# Patient Record
Sex: Male | Born: 1940 | Race: White | Hispanic: No | Marital: Married | Smoking: Former smoker
Health system: Southern US, Community
[De-identification: ages and names within clinical notes are randomized; demographics above are authoritative.]

## PROBLEM LIST (undated history)

## (undated) DIAGNOSIS — K8020 Calculus of gallbladder without cholecystitis without obstruction: Secondary | ICD-10-CM

## (undated) DIAGNOSIS — I517 Cardiomegaly: Secondary | ICD-10-CM

## (undated) DIAGNOSIS — I5042 Chronic combined systolic (congestive) and diastolic (congestive) heart failure: Secondary | ICD-10-CM

## (undated) DIAGNOSIS — I4510 Unspecified right bundle-branch block: Secondary | ICD-10-CM

## (undated) DIAGNOSIS — I4891 Unspecified atrial fibrillation: Secondary | ICD-10-CM

## (undated) DIAGNOSIS — I4819 Other persistent atrial fibrillation: Secondary | ICD-10-CM

## (undated) DIAGNOSIS — E785 Hyperlipidemia, unspecified: Secondary | ICD-10-CM

## (undated) DIAGNOSIS — K649 Unspecified hemorrhoids: Secondary | ICD-10-CM

## (undated) DIAGNOSIS — I255 Ischemic cardiomyopathy: Secondary | ICD-10-CM

## (undated) DIAGNOSIS — I340 Nonrheumatic mitral (valve) insufficiency: Secondary | ICD-10-CM

## (undated) DIAGNOSIS — R0989 Other specified symptoms and signs involving the circulatory and respiratory systems: Secondary | ICD-10-CM

## (undated) DIAGNOSIS — K602 Anal fissure, unspecified: Secondary | ICD-10-CM

## (undated) DIAGNOSIS — I219 Acute myocardial infarction, unspecified: Secondary | ICD-10-CM

## (undated) DIAGNOSIS — T783XXA Angioneurotic edema, initial encounter: Secondary | ICD-10-CM

## (undated) DIAGNOSIS — I2510 Atherosclerotic heart disease of native coronary artery without angina pectoris: Secondary | ICD-10-CM

## (undated) DIAGNOSIS — R0600 Dyspnea, unspecified: Secondary | ICD-10-CM

## (undated) HISTORY — PX: HEMORRHOID SURGERY: SHX153

## (undated) HISTORY — DX: Atherosclerotic heart disease of native coronary artery without angina pectoris: I25.10

## (undated) HISTORY — DX: Hyperlipidemia, unspecified: E78.5

## (undated) HISTORY — DX: Other persistent atrial fibrillation: I48.19

## (undated) HISTORY — PX: OTHER SURGICAL HISTORY: SHX169

## (undated) HISTORY — PX: FOOT SURGERY: SHX648

## (undated) HISTORY — DX: Nonrheumatic mitral (valve) insufficiency: I34.0

## (undated) HISTORY — DX: Unspecified right bundle-branch block: I45.10

## (undated) HISTORY — DX: Unspecified atrial fibrillation: I48.91

## (undated) HISTORY — DX: Angioneurotic edema, initial encounter: T78.3XXA

## (undated) HISTORY — DX: Unspecified hemorrhoids: K64.9

## (undated) HISTORY — DX: Other specified symptoms and signs involving the circulatory and respiratory systems: R09.89

## (undated) HISTORY — PX: CHOLECYSTECTOMY: SHX55

## (undated) HISTORY — DX: Chronic combined systolic (congestive) and diastolic (congestive) heart failure: I50.42

## (undated) HISTORY — DX: Ischemic cardiomyopathy: I25.5

## (undated) HISTORY — PX: BACK SURGERY: SHX140

## (undated) HISTORY — PX: HEMORRHOID BANDING: SHX5850

## (undated) HISTORY — DX: Calculus of gallbladder without cholecystitis without obstruction: K80.20

## (undated) HISTORY — DX: Anal fissure, unspecified: K60.2

## (undated) HISTORY — DX: Cardiomegaly: I51.7

---
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
## 2014-04-06 HISTORY — PX: COLONOSCOPY: SHX174

## 2015-02-22 DIAGNOSIS — M542 Cervicalgia: Secondary | ICD-10-CM | POA: Diagnosis not present

## 2015-02-22 DIAGNOSIS — M5412 Radiculopathy, cervical region: Secondary | ICD-10-CM | POA: Diagnosis not present

## 2015-02-24 DIAGNOSIS — M5412 Radiculopathy, cervical region: Secondary | ICD-10-CM | POA: Diagnosis not present

## 2015-02-24 DIAGNOSIS — M542 Cervicalgia: Secondary | ICD-10-CM | POA: Diagnosis not present

## 2015-02-28 DIAGNOSIS — M542 Cervicalgia: Secondary | ICD-10-CM | POA: Diagnosis not present

## 2015-02-28 DIAGNOSIS — M5412 Radiculopathy, cervical region: Secondary | ICD-10-CM | POA: Diagnosis not present

## 2015-03-02 DIAGNOSIS — M4802 Spinal stenosis, cervical region: Secondary | ICD-10-CM | POA: Diagnosis not present

## 2015-03-02 DIAGNOSIS — M47812 Spondylosis without myelopathy or radiculopathy, cervical region: Secondary | ICD-10-CM | POA: Diagnosis not present

## 2015-03-02 DIAGNOSIS — M5412 Radiculopathy, cervical region: Secondary | ICD-10-CM | POA: Diagnosis not present

## 2015-03-02 DIAGNOSIS — M542 Cervicalgia: Secondary | ICD-10-CM | POA: Diagnosis not present

## 2015-03-02 DIAGNOSIS — M5030 Other cervical disc degeneration, unspecified cervical region: Secondary | ICD-10-CM | POA: Diagnosis not present

## 2015-03-04 DIAGNOSIS — M5412 Radiculopathy, cervical region: Secondary | ICD-10-CM | POA: Diagnosis not present

## 2015-03-04 DIAGNOSIS — M542 Cervicalgia: Secondary | ICD-10-CM | POA: Diagnosis not present

## 2015-03-08 DIAGNOSIS — M542 Cervicalgia: Secondary | ICD-10-CM | POA: Diagnosis not present

## 2015-03-08 DIAGNOSIS — M5412 Radiculopathy, cervical region: Secondary | ICD-10-CM | POA: Diagnosis not present

## 2015-03-30 DIAGNOSIS — Z87891 Personal history of nicotine dependence: Secondary | ICD-10-CM | POA: Diagnosis not present

## 2015-03-30 DIAGNOSIS — Z9049 Acquired absence of other specified parts of digestive tract: Secondary | ICD-10-CM | POA: Diagnosis not present

## 2015-03-30 DIAGNOSIS — R030 Elevated blood-pressure reading, without diagnosis of hypertension: Secondary | ICD-10-CM | POA: Diagnosis not present

## 2015-03-30 DIAGNOSIS — R05 Cough: Secondary | ICD-10-CM | POA: Diagnosis not present

## 2015-03-30 DIAGNOSIS — J40 Bronchitis, not specified as acute or chronic: Secondary | ICD-10-CM | POA: Diagnosis not present

## 2015-06-02 DIAGNOSIS — D485 Neoplasm of uncertain behavior of skin: Secondary | ICD-10-CM | POA: Diagnosis not present

## 2015-06-02 DIAGNOSIS — Z08 Encounter for follow-up examination after completed treatment for malignant neoplasm: Secondary | ICD-10-CM | POA: Diagnosis not present

## 2015-06-02 DIAGNOSIS — L570 Actinic keratosis: Secondary | ICD-10-CM | POA: Diagnosis not present

## 2015-06-02 DIAGNOSIS — Z85828 Personal history of other malignant neoplasm of skin: Secondary | ICD-10-CM | POA: Diagnosis not present

## 2015-06-02 DIAGNOSIS — L821 Other seborrheic keratosis: Secondary | ICD-10-CM | POA: Diagnosis not present

## 2015-06-02 DIAGNOSIS — C44612 Basal cell carcinoma of skin of right upper limb, including shoulder: Secondary | ICD-10-CM | POA: Diagnosis not present

## 2015-06-17 DIAGNOSIS — M5030 Other cervical disc degeneration, unspecified cervical region: Secondary | ICD-10-CM | POA: Diagnosis not present

## 2015-06-17 DIAGNOSIS — M4802 Spinal stenosis, cervical region: Secondary | ICD-10-CM | POA: Diagnosis not present

## 2015-06-17 DIAGNOSIS — M9961 Osseous and subluxation stenosis of intervertebral foramina of cervical region: Secondary | ICD-10-CM | POA: Diagnosis not present

## 2015-06-17 DIAGNOSIS — M47812 Spondylosis without myelopathy or radiculopathy, cervical region: Secondary | ICD-10-CM | POA: Diagnosis not present

## 2015-06-22 DIAGNOSIS — M5030 Other cervical disc degeneration, unspecified cervical region: Secondary | ICD-10-CM | POA: Diagnosis not present

## 2015-06-22 DIAGNOSIS — M5412 Radiculopathy, cervical region: Secondary | ICD-10-CM | POA: Diagnosis not present

## 2015-06-26 DIAGNOSIS — R05 Cough: Secondary | ICD-10-CM | POA: Diagnosis not present

## 2015-06-26 DIAGNOSIS — R030 Elevated blood-pressure reading, without diagnosis of hypertension: Secondary | ICD-10-CM | POA: Diagnosis not present

## 2015-06-26 DIAGNOSIS — J40 Bronchitis, not specified as acute or chronic: Secondary | ICD-10-CM | POA: Diagnosis not present

## 2015-06-26 DIAGNOSIS — J329 Chronic sinusitis, unspecified: Secondary | ICD-10-CM | POA: Diagnosis not present

## 2015-06-26 DIAGNOSIS — Z87891 Personal history of nicotine dependence: Secondary | ICD-10-CM | POA: Diagnosis not present

## 2015-06-26 DIAGNOSIS — J309 Allergic rhinitis, unspecified: Secondary | ICD-10-CM | POA: Diagnosis not present

## 2015-06-26 DIAGNOSIS — R0789 Other chest pain: Secondary | ICD-10-CM | POA: Diagnosis not present

## 2015-06-28 DIAGNOSIS — Z9049 Acquired absence of other specified parts of digestive tract: Secondary | ICD-10-CM | POA: Diagnosis not present

## 2015-06-28 DIAGNOSIS — Z87891 Personal history of nicotine dependence: Secondary | ICD-10-CM | POA: Diagnosis not present

## 2015-06-28 DIAGNOSIS — J40 Bronchitis, not specified as acute or chronic: Secondary | ICD-10-CM | POA: Diagnosis not present

## 2015-06-28 DIAGNOSIS — J019 Acute sinusitis, unspecified: Secondary | ICD-10-CM | POA: Diagnosis not present

## 2015-07-01 DIAGNOSIS — C44612 Basal cell carcinoma of skin of right upper limb, including shoulder: Secondary | ICD-10-CM | POA: Diagnosis not present

## 2015-07-13 DIAGNOSIS — M5412 Radiculopathy, cervical region: Secondary | ICD-10-CM | POA: Diagnosis not present

## 2015-07-13 DIAGNOSIS — M50323 Other cervical disc degeneration at C6-C7 level: Secondary | ICD-10-CM | POA: Diagnosis not present

## 2015-07-13 DIAGNOSIS — M5030 Other cervical disc degeneration, unspecified cervical region: Secondary | ICD-10-CM | POA: Diagnosis not present

## 2015-07-13 DIAGNOSIS — M4802 Spinal stenosis, cervical region: Secondary | ICD-10-CM | POA: Diagnosis not present

## 2015-08-01 DIAGNOSIS — M50222 Other cervical disc displacement at C5-C6 level: Secondary | ICD-10-CM | POA: Diagnosis not present

## 2015-08-01 DIAGNOSIS — M50221 Other cervical disc displacement at C4-C5 level: Secondary | ICD-10-CM | POA: Diagnosis not present

## 2015-08-01 DIAGNOSIS — M50223 Other cervical disc displacement at C6-C7 level: Secondary | ICD-10-CM | POA: Diagnosis not present

## 2015-08-01 DIAGNOSIS — M2578 Osteophyte, vertebrae: Secondary | ICD-10-CM | POA: Diagnosis not present

## 2015-08-03 DIAGNOSIS — S143XXS Injury of brachial plexus, sequela: Secondary | ICD-10-CM | POA: Diagnosis not present

## 2015-08-03 DIAGNOSIS — M79601 Pain in right arm: Secondary | ICD-10-CM | POA: Diagnosis not present

## 2015-08-03 DIAGNOSIS — S4990XS Unspecified injury of shoulder and upper arm, unspecified arm, sequela: Secondary | ICD-10-CM | POA: Diagnosis not present

## 2015-08-08 DIAGNOSIS — M79601 Pain in right arm: Secondary | ICD-10-CM | POA: Diagnosis not present

## 2015-08-08 DIAGNOSIS — S4990XS Unspecified injury of shoulder and upper arm, unspecified arm, sequela: Secondary | ICD-10-CM | POA: Diagnosis not present

## 2015-08-08 DIAGNOSIS — S143XXS Injury of brachial plexus, sequela: Secondary | ICD-10-CM | POA: Diagnosis not present

## 2015-08-11 DIAGNOSIS — S143XXS Injury of brachial plexus, sequela: Secondary | ICD-10-CM | POA: Diagnosis not present

## 2015-08-11 DIAGNOSIS — M79601 Pain in right arm: Secondary | ICD-10-CM | POA: Diagnosis not present

## 2015-08-11 DIAGNOSIS — S4990XS Unspecified injury of shoulder and upper arm, unspecified arm, sequela: Secondary | ICD-10-CM | POA: Diagnosis not present

## 2015-08-12 DIAGNOSIS — G9529 Other cord compression: Secondary | ICD-10-CM | POA: Diagnosis not present

## 2015-08-12 DIAGNOSIS — M2578 Osteophyte, vertebrae: Secondary | ICD-10-CM | POA: Diagnosis not present

## 2015-08-16 DIAGNOSIS — F5101 Primary insomnia: Secondary | ICD-10-CM | POA: Diagnosis not present

## 2015-08-16 DIAGNOSIS — E539 Vitamin B deficiency, unspecified: Secondary | ICD-10-CM | POA: Diagnosis not present

## 2015-08-16 DIAGNOSIS — Z8349 Family history of other endocrine, nutritional and metabolic diseases: Secondary | ICD-10-CM | POA: Diagnosis not present

## 2015-08-16 DIAGNOSIS — Z0000 Encounter for general adult medical examination without abnormal findings: Secondary | ICD-10-CM | POA: Diagnosis not present

## 2015-08-16 DIAGNOSIS — Z6827 Body mass index (BMI) 27.0-27.9, adult: Secondary | ICD-10-CM | POA: Diagnosis not present

## 2015-08-16 DIAGNOSIS — M4806 Spinal stenosis, lumbar region: Secondary | ICD-10-CM | POA: Diagnosis not present

## 2015-08-16 DIAGNOSIS — R350 Frequency of micturition: Secondary | ICD-10-CM | POA: Diagnosis not present

## 2015-08-16 DIAGNOSIS — E785 Hyperlipidemia, unspecified: Secondary | ICD-10-CM | POA: Diagnosis not present

## 2015-08-16 DIAGNOSIS — R42 Dizziness and giddiness: Secondary | ICD-10-CM | POA: Diagnosis not present

## 2015-08-16 DIAGNOSIS — N400 Enlarged prostate without lower urinary tract symptoms: Secondary | ICD-10-CM | POA: Diagnosis not present

## 2015-08-16 DIAGNOSIS — E559 Vitamin D deficiency, unspecified: Secondary | ICD-10-CM | POA: Diagnosis not present

## 2015-08-16 DIAGNOSIS — Z23 Encounter for immunization: Secondary | ICD-10-CM | POA: Diagnosis not present

## 2015-08-16 DIAGNOSIS — M79641 Pain in right hand: Secondary | ICD-10-CM | POA: Diagnosis not present

## 2015-08-17 DIAGNOSIS — S143XXS Injury of brachial plexus, sequela: Secondary | ICD-10-CM | POA: Diagnosis not present

## 2015-08-17 DIAGNOSIS — M79601 Pain in right arm: Secondary | ICD-10-CM | POA: Diagnosis not present

## 2015-08-17 DIAGNOSIS — S4990XS Unspecified injury of shoulder and upper arm, unspecified arm, sequela: Secondary | ICD-10-CM | POA: Diagnosis not present

## 2015-08-18 DIAGNOSIS — F5101 Primary insomnia: Secondary | ICD-10-CM | POA: Diagnosis not present

## 2015-08-18 DIAGNOSIS — E559 Vitamin D deficiency, unspecified: Secondary | ICD-10-CM | POA: Diagnosis not present

## 2015-08-18 DIAGNOSIS — R42 Dizziness and giddiness: Secondary | ICD-10-CM | POA: Diagnosis not present

## 2015-08-18 DIAGNOSIS — M4806 Spinal stenosis, lumbar region: Secondary | ICD-10-CM | POA: Diagnosis not present

## 2015-08-18 DIAGNOSIS — R350 Frequency of micturition: Secondary | ICD-10-CM | POA: Diagnosis not present

## 2015-08-18 DIAGNOSIS — E785 Hyperlipidemia, unspecified: Secondary | ICD-10-CM | POA: Diagnosis not present

## 2015-08-18 DIAGNOSIS — E539 Vitamin B deficiency, unspecified: Secondary | ICD-10-CM | POA: Diagnosis not present

## 2015-08-18 DIAGNOSIS — M79641 Pain in right hand: Secondary | ICD-10-CM | POA: Diagnosis not present

## 2015-08-19 DIAGNOSIS — S143XXS Injury of brachial plexus, sequela: Secondary | ICD-10-CM | POA: Diagnosis not present

## 2015-08-19 DIAGNOSIS — M79601 Pain in right arm: Secondary | ICD-10-CM | POA: Diagnosis not present

## 2015-08-19 DIAGNOSIS — S4990XS Unspecified injury of shoulder and upper arm, unspecified arm, sequela: Secondary | ICD-10-CM | POA: Diagnosis not present

## 2015-08-25 DIAGNOSIS — R200 Anesthesia of skin: Secondary | ICD-10-CM | POA: Diagnosis not present

## 2015-08-25 DIAGNOSIS — G5601 Carpal tunnel syndrome, right upper limb: Secondary | ICD-10-CM | POA: Diagnosis not present

## 2015-08-25 DIAGNOSIS — M5020 Other cervical disc displacement, unspecified cervical region: Secondary | ICD-10-CM | POA: Diagnosis not present

## 2015-08-25 DIAGNOSIS — M4802 Spinal stenosis, cervical region: Secondary | ICD-10-CM | POA: Diagnosis not present

## 2015-08-26 DIAGNOSIS — M79601 Pain in right arm: Secondary | ICD-10-CM | POA: Diagnosis not present

## 2015-08-26 DIAGNOSIS — S143XXS Injury of brachial plexus, sequela: Secondary | ICD-10-CM | POA: Diagnosis not present

## 2015-08-26 DIAGNOSIS — S4990XS Unspecified injury of shoulder and upper arm, unspecified arm, sequela: Secondary | ICD-10-CM | POA: Diagnosis not present

## 2015-08-29 DIAGNOSIS — M79601 Pain in right arm: Secondary | ICD-10-CM | POA: Diagnosis not present

## 2015-08-29 DIAGNOSIS — S143XXS Injury of brachial plexus, sequela: Secondary | ICD-10-CM | POA: Diagnosis not present

## 2015-08-29 DIAGNOSIS — S4990XS Unspecified injury of shoulder and upper arm, unspecified arm, sequela: Secondary | ICD-10-CM | POA: Diagnosis not present

## 2015-08-30 DIAGNOSIS — M79601 Pain in right arm: Secondary | ICD-10-CM | POA: Diagnosis not present

## 2015-08-30 DIAGNOSIS — S4990XS Unspecified injury of shoulder and upper arm, unspecified arm, sequela: Secondary | ICD-10-CM | POA: Diagnosis not present

## 2015-08-30 DIAGNOSIS — S143XXS Injury of brachial plexus, sequela: Secondary | ICD-10-CM | POA: Diagnosis not present

## 2015-08-31 DIAGNOSIS — G5601 Carpal tunnel syndrome, right upper limb: Secondary | ICD-10-CM | POA: Diagnosis not present

## 2015-09-06 DIAGNOSIS — G5601 Carpal tunnel syndrome, right upper limb: Secondary | ICD-10-CM | POA: Diagnosis not present

## 2015-09-06 DIAGNOSIS — E539 Vitamin B deficiency, unspecified: Secondary | ICD-10-CM | POA: Diagnosis not present

## 2015-09-06 DIAGNOSIS — R7301 Impaired fasting glucose: Secondary | ICD-10-CM | POA: Diagnosis not present

## 2015-09-06 DIAGNOSIS — I2510 Atherosclerotic heart disease of native coronary artery without angina pectoris: Secondary | ICD-10-CM | POA: Diagnosis not present

## 2015-09-06 DIAGNOSIS — E785 Hyperlipidemia, unspecified: Secondary | ICD-10-CM | POA: Diagnosis not present

## 2015-09-06 DIAGNOSIS — Z01810 Encounter for preprocedural cardiovascular examination: Secondary | ICD-10-CM | POA: Diagnosis not present

## 2015-09-06 DIAGNOSIS — I498 Other specified cardiac arrhythmias: Secondary | ICD-10-CM | POA: Diagnosis not present

## 2015-09-14 DIAGNOSIS — G5601 Carpal tunnel syndrome, right upper limb: Secondary | ICD-10-CM | POA: Diagnosis not present

## 2015-09-20 DIAGNOSIS — I219 Acute myocardial infarction, unspecified: Secondary | ICD-10-CM

## 2015-09-20 DIAGNOSIS — S143XXS Injury of brachial plexus, sequela: Secondary | ICD-10-CM | POA: Diagnosis not present

## 2015-09-20 DIAGNOSIS — M79601 Pain in right arm: Secondary | ICD-10-CM | POA: Diagnosis not present

## 2015-09-20 DIAGNOSIS — S4990XS Unspecified injury of shoulder and upper arm, unspecified arm, sequela: Secondary | ICD-10-CM | POA: Diagnosis not present

## 2015-09-20 HISTORY — DX: Acute myocardial infarction, unspecified: I21.9

## 2015-09-28 DIAGNOSIS — R9431 Abnormal electrocardiogram [ECG] [EKG]: Secondary | ICD-10-CM | POA: Diagnosis not present

## 2015-09-30 DIAGNOSIS — S4990XS Unspecified injury of shoulder and upper arm, unspecified arm, sequela: Secondary | ICD-10-CM | POA: Diagnosis not present

## 2015-09-30 DIAGNOSIS — M79601 Pain in right arm: Secondary | ICD-10-CM | POA: Diagnosis not present

## 2015-09-30 DIAGNOSIS — S143XXS Injury of brachial plexus, sequela: Secondary | ICD-10-CM | POA: Diagnosis not present

## 2015-10-05 DIAGNOSIS — H25813 Combined forms of age-related cataract, bilateral: Secondary | ICD-10-CM | POA: Diagnosis not present

## 2015-10-05 DIAGNOSIS — H43811 Vitreous degeneration, right eye: Secondary | ICD-10-CM | POA: Diagnosis not present

## 2015-10-07 DIAGNOSIS — M79601 Pain in right arm: Secondary | ICD-10-CM | POA: Diagnosis not present

## 2015-10-07 DIAGNOSIS — S143XXS Injury of brachial plexus, sequela: Secondary | ICD-10-CM | POA: Diagnosis not present

## 2015-10-07 DIAGNOSIS — S4990XS Unspecified injury of shoulder and upper arm, unspecified arm, sequela: Secondary | ICD-10-CM | POA: Diagnosis not present

## 2015-10-10 DIAGNOSIS — M79601 Pain in right arm: Secondary | ICD-10-CM | POA: Diagnosis not present

## 2015-10-10 DIAGNOSIS — S143XXS Injury of brachial plexus, sequela: Secondary | ICD-10-CM | POA: Diagnosis not present

## 2015-10-10 DIAGNOSIS — S4990XS Unspecified injury of shoulder and upper arm, unspecified arm, sequela: Secondary | ICD-10-CM | POA: Diagnosis not present

## 2015-10-12 DIAGNOSIS — M79601 Pain in right arm: Secondary | ICD-10-CM | POA: Diagnosis not present

## 2015-10-12 DIAGNOSIS — S4990XS Unspecified injury of shoulder and upper arm, unspecified arm, sequela: Secondary | ICD-10-CM | POA: Diagnosis not present

## 2015-10-12 DIAGNOSIS — S143XXS Injury of brachial plexus, sequela: Secondary | ICD-10-CM | POA: Diagnosis not present

## 2015-10-14 DIAGNOSIS — M79601 Pain in right arm: Secondary | ICD-10-CM | POA: Diagnosis not present

## 2015-10-14 DIAGNOSIS — S4990XS Unspecified injury of shoulder and upper arm, unspecified arm, sequela: Secondary | ICD-10-CM | POA: Diagnosis not present

## 2015-10-14 DIAGNOSIS — S143XXS Injury of brachial plexus, sequela: Secondary | ICD-10-CM | POA: Diagnosis not present

## 2015-10-17 DIAGNOSIS — I2510 Atherosclerotic heart disease of native coronary artery without angina pectoris: Secondary | ICD-10-CM | POA: Diagnosis not present

## 2015-10-19 DIAGNOSIS — M79601 Pain in right arm: Secondary | ICD-10-CM | POA: Diagnosis not present

## 2015-10-19 DIAGNOSIS — S143XXS Injury of brachial plexus, sequela: Secondary | ICD-10-CM | POA: Diagnosis not present

## 2015-10-19 DIAGNOSIS — S4990XS Unspecified injury of shoulder and upper arm, unspecified arm, sequela: Secondary | ICD-10-CM | POA: Diagnosis not present

## 2015-11-02 DIAGNOSIS — S4990XS Unspecified injury of shoulder and upper arm, unspecified arm, sequela: Secondary | ICD-10-CM | POA: Diagnosis not present

## 2015-11-02 DIAGNOSIS — S143XXS Injury of brachial plexus, sequela: Secondary | ICD-10-CM | POA: Diagnosis not present

## 2015-11-02 DIAGNOSIS — M79601 Pain in right arm: Secondary | ICD-10-CM | POA: Diagnosis not present

## 2015-11-04 DIAGNOSIS — M79601 Pain in right arm: Secondary | ICD-10-CM | POA: Diagnosis not present

## 2015-11-04 DIAGNOSIS — S4990XS Unspecified injury of shoulder and upper arm, unspecified arm, sequela: Secondary | ICD-10-CM | POA: Diagnosis not present

## 2015-11-04 DIAGNOSIS — S143XXS Injury of brachial plexus, sequela: Secondary | ICD-10-CM | POA: Diagnosis not present

## 2015-11-09 DIAGNOSIS — S4990XS Unspecified injury of shoulder and upper arm, unspecified arm, sequela: Secondary | ICD-10-CM | POA: Diagnosis not present

## 2015-11-09 DIAGNOSIS — M79601 Pain in right arm: Secondary | ICD-10-CM | POA: Diagnosis not present

## 2015-11-09 DIAGNOSIS — S143XXS Injury of brachial plexus, sequela: Secondary | ICD-10-CM | POA: Diagnosis not present

## 2015-11-14 DIAGNOSIS — S143XXS Injury of brachial plexus, sequela: Secondary | ICD-10-CM | POA: Diagnosis not present

## 2015-11-14 DIAGNOSIS — S4990XS Unspecified injury of shoulder and upper arm, unspecified arm, sequela: Secondary | ICD-10-CM | POA: Diagnosis not present

## 2015-11-14 DIAGNOSIS — M79601 Pain in right arm: Secondary | ICD-10-CM | POA: Diagnosis not present

## 2015-11-18 DIAGNOSIS — S143XXS Injury of brachial plexus, sequela: Secondary | ICD-10-CM | POA: Diagnosis not present

## 2015-11-18 DIAGNOSIS — I2510 Atherosclerotic heart disease of native coronary artery without angina pectoris: Secondary | ICD-10-CM | POA: Diagnosis not present

## 2015-11-18 DIAGNOSIS — M79601 Pain in right arm: Secondary | ICD-10-CM | POA: Diagnosis not present

## 2015-11-18 DIAGNOSIS — S4990XS Unspecified injury of shoulder and upper arm, unspecified arm, sequela: Secondary | ICD-10-CM | POA: Diagnosis not present

## 2015-11-22 DIAGNOSIS — Z23 Encounter for immunization: Secondary | ICD-10-CM | POA: Diagnosis not present

## 2015-11-22 DIAGNOSIS — I2510 Atherosclerotic heart disease of native coronary artery without angina pectoris: Secondary | ICD-10-CM | POA: Diagnosis not present

## 2015-11-22 DIAGNOSIS — E785 Hyperlipidemia, unspecified: Secondary | ICD-10-CM | POA: Diagnosis not present

## 2015-11-22 DIAGNOSIS — K219 Gastro-esophageal reflux disease without esophagitis: Secondary | ICD-10-CM | POA: Diagnosis not present

## 2015-11-22 DIAGNOSIS — G5601 Carpal tunnel syndrome, right upper limb: Secondary | ICD-10-CM | POA: Diagnosis not present

## 2015-11-22 DIAGNOSIS — E539 Vitamin B deficiency, unspecified: Secondary | ICD-10-CM | POA: Diagnosis not present

## 2015-11-22 DIAGNOSIS — Z01810 Encounter for preprocedural cardiovascular examination: Secondary | ICD-10-CM | POA: Diagnosis not present

## 2015-11-22 DIAGNOSIS — R7301 Impaired fasting glucose: Secondary | ICD-10-CM | POA: Diagnosis not present

## 2015-11-22 DIAGNOSIS — Z6827 Body mass index (BMI) 27.0-27.9, adult: Secondary | ICD-10-CM | POA: Diagnosis not present

## 2015-11-25 DIAGNOSIS — S143XXS Injury of brachial plexus, sequela: Secondary | ICD-10-CM | POA: Diagnosis not present

## 2015-11-25 DIAGNOSIS — M79601 Pain in right arm: Secondary | ICD-10-CM | POA: Diagnosis not present

## 2015-11-25 DIAGNOSIS — S4990XS Unspecified injury of shoulder and upper arm, unspecified arm, sequela: Secondary | ICD-10-CM | POA: Diagnosis not present

## 2015-12-09 DIAGNOSIS — S143XXS Injury of brachial plexus, sequela: Secondary | ICD-10-CM | POA: Diagnosis not present

## 2015-12-09 DIAGNOSIS — S4990XS Unspecified injury of shoulder and upper arm, unspecified arm, sequela: Secondary | ICD-10-CM | POA: Diagnosis not present

## 2015-12-09 DIAGNOSIS — M79601 Pain in right arm: Secondary | ICD-10-CM | POA: Diagnosis not present

## 2016-08-17 DIAGNOSIS — H25813 Combined forms of age-related cataract, bilateral: Secondary | ICD-10-CM | POA: Diagnosis not present

## 2016-08-17 DIAGNOSIS — H5203 Hypermetropia, bilateral: Secondary | ICD-10-CM | POA: Diagnosis not present

## 2016-08-17 DIAGNOSIS — H52223 Regular astigmatism, bilateral: Secondary | ICD-10-CM | POA: Diagnosis not present

## 2016-08-17 DIAGNOSIS — H53002 Unspecified amblyopia, left eye: Secondary | ICD-10-CM | POA: Diagnosis not present

## 2016-10-13 DIAGNOSIS — I10 Essential (primary) hypertension: Secondary | ICD-10-CM | POA: Diagnosis not present

## 2016-10-13 DIAGNOSIS — J3089 Other allergic rhinitis: Secondary | ICD-10-CM | POA: Diagnosis not present

## 2016-10-13 DIAGNOSIS — R0602 Shortness of breath: Secondary | ICD-10-CM | POA: Diagnosis not present

## 2016-10-15 ENCOUNTER — Emergency Department (HOSPITAL_COMMUNITY): Payer: Medicare Other

## 2016-10-15 ENCOUNTER — Encounter (HOSPITAL_COMMUNITY): Payer: Self-pay | Admitting: Emergency Medicine

## 2016-10-15 ENCOUNTER — Inpatient Hospital Stay (HOSPITAL_COMMUNITY)
Admission: EM | Admit: 2016-10-15 | Discharge: 2016-10-24 | DRG: 234 | Disposition: A | Discharge status: Home / Self Care | Payer: Medicare Other | Attending: Surgery | Admitting: Surgery

## 2016-10-15 ENCOUNTER — Inpatient Hospital Stay (HOSPITAL_COMMUNITY): Payer: Medicare Other

## 2016-10-15 DIAGNOSIS — I5043 Acute on chronic combined systolic (congestive) and diastolic (congestive) heart failure: Secondary | ICD-10-CM | POA: Diagnosis present

## 2016-10-15 DIAGNOSIS — I25110 Atherosclerotic heart disease of native coronary artery with unstable angina pectoris: Secondary | ICD-10-CM | POA: Diagnosis not present

## 2016-10-15 DIAGNOSIS — I110 Hypertensive heart disease with heart failure: Secondary | ICD-10-CM | POA: Diagnosis not present

## 2016-10-15 DIAGNOSIS — I480 Paroxysmal atrial fibrillation: Secondary | ICD-10-CM | POA: Diagnosis not present

## 2016-10-15 DIAGNOSIS — I5041 Acute combined systolic (congestive) and diastolic (congestive) heart failure: Secondary | ICD-10-CM

## 2016-10-15 DIAGNOSIS — R748 Abnormal levels of other serum enzymes: Secondary | ICD-10-CM

## 2016-10-15 DIAGNOSIS — I2510 Atherosclerotic heart disease of native coronary artery without angina pectoris: Secondary | ICD-10-CM | POA: Diagnosis not present

## 2016-10-15 DIAGNOSIS — Z79899 Other long term (current) drug therapy: Secondary | ICD-10-CM | POA: Diagnosis not present

## 2016-10-15 DIAGNOSIS — I509 Heart failure, unspecified: Secondary | ICD-10-CM | POA: Diagnosis not present

## 2016-10-15 DIAGNOSIS — Z9689 Presence of other specified functional implants: Secondary | ICD-10-CM | POA: Diagnosis present

## 2016-10-15 DIAGNOSIS — E7800 Pure hypercholesterolemia, unspecified: Secondary | ICD-10-CM

## 2016-10-15 DIAGNOSIS — I25758 Atherosclerosis of native coronary artery of transplanted heart with other forms of angina pectoris: Secondary | ICD-10-CM

## 2016-10-15 DIAGNOSIS — I25118 Atherosclerotic heart disease of native coronary artery with other forms of angina pectoris: Secondary | ICD-10-CM | POA: Diagnosis not present

## 2016-10-15 DIAGNOSIS — Z951 Presence of aortocoronary bypass graft: Secondary | ICD-10-CM | POA: Diagnosis not present

## 2016-10-15 DIAGNOSIS — I272 Pulmonary hypertension, unspecified: Secondary | ICD-10-CM | POA: Diagnosis not present

## 2016-10-15 DIAGNOSIS — I5021 Acute systolic (congestive) heart failure: Secondary | ICD-10-CM | POA: Diagnosis not present

## 2016-10-15 DIAGNOSIS — D696 Thrombocytopenia, unspecified: Secondary | ICD-10-CM | POA: Diagnosis not present

## 2016-10-15 DIAGNOSIS — I5020 Unspecified systolic (congestive) heart failure: Secondary | ICD-10-CM | POA: Diagnosis not present

## 2016-10-15 DIAGNOSIS — I083 Combined rheumatic disorders of mitral, aortic and tricuspid valves: Secondary | ICD-10-CM | POA: Diagnosis not present

## 2016-10-15 DIAGNOSIS — I255 Ischemic cardiomyopathy: Secondary | ICD-10-CM | POA: Diagnosis not present

## 2016-10-15 DIAGNOSIS — Z4682 Encounter for fitting and adjustment of non-vascular catheter: Secondary | ICD-10-CM | POA: Diagnosis not present

## 2016-10-15 DIAGNOSIS — D62 Acute posthemorrhagic anemia: Secondary | ICD-10-CM | POA: Diagnosis not present

## 2016-10-15 DIAGNOSIS — J939 Pneumothorax, unspecified: Secondary | ICD-10-CM | POA: Diagnosis not present

## 2016-10-15 DIAGNOSIS — Z7982 Long term (current) use of aspirin: Secondary | ICD-10-CM

## 2016-10-15 DIAGNOSIS — I2582 Chronic total occlusion of coronary artery: Secondary | ICD-10-CM | POA: Diagnosis not present

## 2016-10-15 DIAGNOSIS — R778 Other specified abnormalities of plasma proteins: Secondary | ICD-10-CM

## 2016-10-15 DIAGNOSIS — I071 Rheumatic tricuspid insufficiency: Secondary | ICD-10-CM | POA: Diagnosis present

## 2016-10-15 DIAGNOSIS — Z01810 Encounter for preprocedural cardiovascular examination: Secondary | ICD-10-CM | POA: Diagnosis not present

## 2016-10-15 DIAGNOSIS — R7989 Other specified abnormal findings of blood chemistry: Secondary | ICD-10-CM

## 2016-10-15 DIAGNOSIS — I517 Cardiomegaly: Secondary | ICD-10-CM | POA: Diagnosis not present

## 2016-10-15 DIAGNOSIS — R0602 Shortness of breath: Secondary | ICD-10-CM | POA: Diagnosis not present

## 2016-10-15 DIAGNOSIS — I5023 Acute on chronic systolic (congestive) heart failure: Secondary | ICD-10-CM | POA: Diagnosis not present

## 2016-10-15 DIAGNOSIS — I361 Nonrheumatic tricuspid (valve) insufficiency: Secondary | ICD-10-CM | POA: Diagnosis not present

## 2016-10-15 LAB — PROTIME-INR
INR: 1.09
PROTHROMBIN TIME: 14.1 s (ref 11.4–15.2)

## 2016-10-15 LAB — CBC WITH DIFFERENTIAL/PLATELET
BASOS ABS: 0 10*3/uL (ref 0.0–0.1)
Basophils Relative: 0 %
EOS ABS: 0.1 10*3/uL (ref 0.0–0.7)
Eosinophils Relative: 1 %
HCT: 47.3 % (ref 39.0–52.0)
Hemoglobin: 15.7 g/dL (ref 13.0–17.0)
LYMPHS ABS: 1.8 10*3/uL (ref 0.7–4.0)
Lymphocytes Relative: 22 %
MCH: 30.3 pg (ref 26.0–34.0)
MCHC: 33.2 g/dL (ref 30.0–36.0)
MCV: 91.1 fL (ref 78.0–100.0)
MONO ABS: 0.7 10*3/uL (ref 0.1–1.0)
Monocytes Relative: 8 %
NEUTROS PCT: 69 %
Neutro Abs: 5.6 10*3/uL (ref 1.7–7.7)
PLATELETS: 104 10*3/uL — AB (ref 150–400)
RBC: 5.19 MIL/uL (ref 4.22–5.81)
RDW: 14.9 % (ref 11.5–15.5)
WBC: 8.2 10*3/uL (ref 4.0–10.5)

## 2016-10-15 LAB — COMPREHENSIVE METABOLIC PANEL
ALBUMIN: 4.2 g/dL (ref 3.5–5.0)
ALT: 61 U/L (ref 17–63)
AST: 45 U/L — ABNORMAL HIGH (ref 15–41)
Alkaline Phosphatase: 73 U/L (ref 38–126)
Anion gap: 9 (ref 5–15)
BUN: 23 mg/dL — ABNORMAL HIGH (ref 6–20)
CHLORIDE: 105 mmol/L (ref 101–111)
CO2: 25 mmol/L (ref 22–32)
CREATININE: 1.23 mg/dL (ref 0.61–1.24)
Calcium: 9.1 mg/dL (ref 8.9–10.3)
GFR calc non Af Amer: 56 mL/min — ABNORMAL LOW (ref >60)
GLUCOSE: 101 mg/dL — AB (ref 65–99)
Potassium: 4.4 mmol/L (ref 3.5–5.1)
SODIUM: 139 mmol/L (ref 135–145)
Total Bilirubin: 1.8 mg/dL — ABNORMAL HIGH (ref 0.3–1.2)
Total Protein: 7.1 g/dL (ref 6.5–8.1)

## 2016-10-15 LAB — LIPID PANEL
CHOLESTEROL: 165 mg/dL (ref 0–200)
HDL: 44 mg/dL (ref >40)
LDL Cholesterol: 112 mg/dL — ABNORMAL HIGH (ref 0–99)
TRIGLYCERIDES: 45 mg/dL (ref <150)
Total CHOL/HDL Ratio: 3.8 RATIO
VLDL: 9 mg/dL (ref 0–40)

## 2016-10-15 LAB — MAGNESIUM: MAGNESIUM: 2.2 mg/dL (ref 1.7–2.4)

## 2016-10-15 LAB — I-STAT TROPONIN, ED: Troponin i, poc: 0.4 ng/mL (ref 0.00–0.08)

## 2016-10-15 LAB — ECHOCARDIOGRAM COMPLETE
Height: 73 in
WEIGHTICAEL: 3192 [oz_av]

## 2016-10-15 LAB — TROPONIN I
TROPONIN I: 0.42 ng/mL — AB (ref <0.03)
TROPONIN I: 0.31 ng/mL — AB (ref <0.03)

## 2016-10-15 LAB — BRAIN NATRIURETIC PEPTIDE: B NATRIURETIC PEPTIDE 5: 350.5 pg/mL — AB (ref 0.0–100.0)

## 2016-10-15 LAB — HEPARIN LEVEL (UNFRACTIONATED): Heparin Unfractionated: 0.28 IU/mL — ABNORMAL LOW (ref 0.30–0.70)

## 2016-10-15 LAB — D-DIMER, QUANTITATIVE (NOT AT ARMC): D DIMER QUANT: 1.05 ug{FEU}/mL — AB (ref 0.00–0.50)

## 2016-10-15 LAB — I-STAT CG4 LACTIC ACID, ED: Lactic Acid, Venous: 1.3 mmol/L (ref 0.5–1.9)

## 2016-10-15 MED ORDER — ALBUTEROL SULFATE (2.5 MG/3ML) 0.083% IN NEBU
5.0000 mg | INHALATION_SOLUTION | Freq: Once | RESPIRATORY_TRACT | Status: AC
Start: 2016-10-15 — End: 2016-10-15
  Administered 2016-10-15: 5 mg via RESPIRATORY_TRACT
  Filled 2016-10-15: qty 6

## 2016-10-15 MED ORDER — LOSARTAN POTASSIUM 50 MG PO TABS
50.0000 mg | ORAL_TABLET | Freq: Every day | ORAL | Status: DC
  Administered 2016-10-15 – 2016-10-17 (×2): 50 mg via ORAL
  Filled 2016-10-15 (×3): qty 1

## 2016-10-15 MED ORDER — ONDANSETRON HCL 4 MG/2ML IJ SOLN
4.0000 mg | Freq: Four times a day (QID) | INTRAMUSCULAR | Status: DC | PRN
Start: 2016-10-15 — End: 2016-10-18

## 2016-10-15 MED ORDER — IOPAMIDOL (ISOVUE-370) INJECTION 76%
100.0000 mL | Freq: Once | INTRAVENOUS | Status: AC | PRN
  Administered 2016-10-15: 100 mL via INTRAVENOUS

## 2016-10-15 MED ORDER — IPRATROPIUM BROMIDE 0.02 % IN SOLN
0.5000 mg | Freq: Once | RESPIRATORY_TRACT | Status: AC
  Administered 2016-10-15: 0.5 mg via RESPIRATORY_TRACT
  Filled 2016-10-15: qty 2.5

## 2016-10-15 MED ORDER — METHYLPREDNISOLONE SODIUM SUCC 125 MG IJ SOLR
125.0000 mg | Freq: Once | INTRAMUSCULAR | Status: AC
  Administered 2016-10-15: 125 mg via INTRAVENOUS
  Filled 2016-10-15: qty 2

## 2016-10-15 MED ORDER — ONDANSETRON HCL 4 MG PO TABS: 4.0000 mg | ORAL_TABLET | Freq: Four times a day (QID) | ORAL | Status: DC | PRN

## 2016-10-15 MED ORDER — SODIUM CHLORIDE 0.9 % IV SOLN
INTRAVENOUS | Status: DC
  Administered 2016-10-16: 06:00:00 via INTRAVENOUS

## 2016-10-15 MED ORDER — ACETAMINOPHEN 650 MG RE SUPP: 650.0000 mg | Freq: Four times a day (QID) | RECTAL | Status: DC | PRN

## 2016-10-15 MED ORDER — HEPARIN (PORCINE) IN NACL 100-0.45 UNIT/ML-% IJ SOLN
1200.0000 [IU]/h | INTRAMUSCULAR | Status: DC
  Administered 2016-10-15: 1200 [IU]/h via INTRAVENOUS
  Filled 2016-10-15 (×2): qty 250

## 2016-10-15 MED ORDER — SODIUM CHLORIDE 0.9% FLUSH
3.0000 mL | Freq: Two times a day (BID) | INTRAVENOUS | Status: DC
  Administered 2016-10-15: 3 mL via INTRAVENOUS

## 2016-10-15 MED ORDER — FUROSEMIDE 10 MG/ML IJ SOLN
40.0000 mg | Freq: Once | INTRAMUSCULAR | Status: AC
  Administered 2016-10-15: 40 mg via INTRAVENOUS
  Filled 2016-10-15: qty 4

## 2016-10-15 MED ORDER — SODIUM CHLORIDE 0.9% FLUSH: 3.0000 mL | INTRAVENOUS | Status: DC | PRN

## 2016-10-15 MED ORDER — ALBUTEROL SULFATE (2.5 MG/3ML) 0.083% IN NEBU
2.5000 mg | INHALATION_SOLUTION | RESPIRATORY_TRACT | Status: DC | PRN
  Administered 2016-10-17: 2.5 mg via RESPIRATORY_TRACT

## 2016-10-15 MED ORDER — ASPIRIN 81 MG PO CHEW
81.0000 mg | CHEWABLE_TABLET | ORAL | Status: AC
  Administered 2016-10-16: 81 mg via ORAL
  Filled 2016-10-15: qty 1

## 2016-10-15 MED ORDER — HEPARIN BOLUS VIA INFUSION
4000.0000 [IU] | Freq: Once | INTRAVENOUS | Status: AC
  Administered 2016-10-15: 4000 [IU] via INTRAVENOUS
  Filled 2016-10-15: qty 4000

## 2016-10-15 MED ORDER — POLYETHYLENE GLYCOL 3350 17 G PO PACK: 17.0000 g | PACK | Freq: Every day | ORAL | Status: DC | PRN

## 2016-10-15 MED ORDER — SODIUM CHLORIDE 0.9 % IV SOLN: 250.0000 mL | INTRAVENOUS | Status: DC | PRN

## 2016-10-15 MED ORDER — ASPIRIN 325 MG PO TABS
325.0000 mg | ORAL_TABLET | Freq: Once | ORAL | Status: AC
  Administered 2016-10-15: 325 mg via ORAL
  Filled 2016-10-15: qty 1

## 2016-10-15 MED ORDER — HEPARIN (PORCINE) IN NACL 100-0.45 UNIT/ML-% IJ SOLN
1350.0000 [IU]/h | INTRAMUSCULAR | Status: DC
  Administered 2016-10-16: 1350 [IU]/h via INTRAVENOUS
  Filled 2016-10-15: qty 250

## 2016-10-15 MED ORDER — ACETAMINOPHEN 325 MG PO TABS: 650.0000 mg | ORAL_TABLET | Freq: Four times a day (QID) | ORAL | Status: DC | PRN

## 2016-10-15 MED ORDER — CARVEDILOL 3.125 MG PO TABS
3.1250 mg | ORAL_TABLET | Freq: Two times a day (BID) | ORAL | Status: DC
  Administered 2016-10-15 – 2016-10-17 (×5): 3.125 mg via ORAL
  Filled 2016-10-15 (×6): qty 1

## 2016-10-15 MED ORDER — IOPAMIDOL (ISOVUE-370) INJECTION 76%
INTRAVENOUS | Status: AC
  Administered 2016-10-15: 100 mL via INTRAVENOUS
  Filled 2016-10-15: qty 100

## 2016-10-15 MED ORDER — FUROSEMIDE 10 MG/ML IJ SOLN
40.0000 mg | Freq: Two times a day (BID) | INTRAMUSCULAR | Status: DC
  Administered 2016-10-16 – 2016-10-17 (×4): 40 mg via INTRAVENOUS
  Filled 2016-10-15 (×4): qty 4

## 2016-10-15 NOTE — Progress Notes (Signed)
  Echocardiogram 2D Echocardiogram has been performed.  Darlina Sicilian M 10/15/2016, 12:03 PM

## 2016-10-15 NOTE — ED Notes (Signed)
ED Provider at bedside. 

## 2016-10-15 NOTE — ED Triage Notes (Signed)
Pt reports having increasing shortness of breath over the last several days and is worsening when laying flat and increasing shortness of breath with exertion.

## 2016-10-15 NOTE — Progress Notes (Addendum)
EF is low, with WMA will have pt transferred to [facility] and plan Rt and Lt heart cath tomorrow.  Will begin ARB and BB.  Dr. Johnsie Cancel is aware and has talked to wife by phone.

## 2016-10-15 NOTE — Progress Notes (Signed)
ANTICOAGULATION CONSULT NOTE - Initial Consult  Pharmacy Consult for heparin Indication: chest pain/ACS vs new onset CHF vs PE vs asthma or PNA  No Known Allergies  Patient Measurements: Height: 6\' 1"  (185.4 cm) Weight: 199 lb 8 oz (90.5 kg) IBW/kg (Calculated) : 79.9 Heparin Dosing Weight:90.5 kg  Vital Signs: Temp: 98.7 F (37.1 C) (08/27 0740) Temp Source: Oral (08/27 0740) BP: 137/97 (08/27 0740) Pulse Rate: 84 (08/27 0740)  Labs:  Recent Labs  10/15/16 0752  HGB 15.7  HCT 47.3  PLT PENDING  CREATININE 1.23    Estimated Creatinine Clearance: 58.6 mL/min (by C-G formula based on SCr of 1.23 mg/dL).   Medical History: History reviewed. No pertinent past medical history.   Assessment 76 yo M with SOB.  Pharmacy consulted to dose heparin for ACS/STEMI vs new onset CHF vs PE vs asthma or PNA.  No anticoagulants PTA.  WT 90.5 kg, creat 1.23, D dimer 1.05. Troponin 0.4. CT angio ordered to r/o PE.  Goal of Therapy:  Heparin level 0.3-0.7 units/ml Monitor platelets by anticoagulation protocol: Yes   Plan:  Give 4000 units bolus x 1 Start heparin infusion at 1200 units/hr Check anti-Xa level in 8 hours and daily while on heparin Continue to monitor H&H and platelets F/u CT angio to r/o PE F/u troponins  Eudelia Bunch, Pharm.D. [phone] 10/15/2016 8:55 AM

## 2016-10-15 NOTE — ED Notes (Signed)
Echo at bedside

## 2016-10-15 NOTE — ED Notes (Signed)
[facility] called for transport. 

## 2016-10-15 NOTE — ED Notes (Signed)
Cardiology at bedside.

## 2016-10-15 NOTE — Progress Notes (Signed)
[place] for heparin Indication: chest pain/ACS vs new onset CHF vs PE vs asthma or PNA  No Known Allergies  Patient Measurements: Height: 6\' 1"  (185.4 cm) Weight: 199 lb 8 oz (90.5 kg) IBW/kg (Calculated) : 79.9 Heparin Dosing Weight:90.5 kg  Vital Signs: BP: 146/88 (08/27 1900) Pulse Rate: 88 (08/27 1900)  Labs:  Recent Labs  10/15/16 0752 10/15/16 0803 10/15/16 1155 10/15/16 1827  HGB 15.7  --   --   --   HCT 47.3  --   --   --   PLT 104*  --   --   --   LABPROT  --  14.1  --   --   INR  --  1.09  --   --   HEPARINUNFRC  --   --   --  0.28*  CREATININE 1.23  --   --   --   TROPONINI  --   --  0.42*  --     Estimated Creatinine Clearance: 58.6 mL/min (by C-G formula based on SCr of 1.23 mg/dL).   Medical History: History reviewed. No pertinent past medical history.   Assessment 76 yo M with SOB.  Pharmacy consulted to dose heparin for ACS/STEMI vs new onset CHF vs PE vs asthma or PNA.  No anticoagulants PTA.  WT 90.5 kg, creat 1.23, D dimer 1.05. Troponin 0.4. CT angio ordered to r/o PE.    CTAngio = - PE  1st Heparin level = 0.28 with heparin infusing @ 1200 units/hr  No complications of therapy noted  Goal of Therapy:  Heparin level 0.3-0.7 units/ml Monitor platelets by anticoagulation protocol: Yes   Plan:  Increase IV heparin to 1350 units/hr Check heparin level 8 hr after rate increase Follow daily CBC  Leone Haven, PharmD 10/15/2016 8:01 PM

## 2016-10-15 NOTE — ED Provider Notes (Addendum)
[facility] DEPT Provider Note   CSN: [id number] Arrival date & time: 10/15/16  0701     History   Chief Complaint Chief Complaint  Patient presents with  . Shortness of Breath    HPI Jason Day is a 76 y.o. male history of previous cholecystectomy, previous back surgery who presenting with shortness of breath, chest pain. Patient just moved here from [facility] and has no doctors here. 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 He has progressive shortness of breath. Patient states that initially he is worse at night and he sometimes wake up in the middle night short of breath. He went to urgent care 3 days ago and received a shot of steroids as well as an albuterol inhaler but has not helped his symptoms. Since yesterday, he noticed that he has worsening shortness of breath especially with exertion. He denies any fevers or chills or cough. Patient denies any cardiac history or history of blood clots. Denies any leg swelling. He did have previous stent in his liver after he was admitted in [facility] for possible flu but was in the hospital for about 3 months. Patient has no cardiac stents.   The history is provided by the patient.    History reviewed. No pertinent past medical history.  There are no active problems to display for this patient.   Past Surgical History:  Procedure Laterality Date  . BACK SURGERY    . CHOLECYSTECTOMY    . FOOT SURGERY    . stents in liver         Home Medications    Prior to Admission medications   Medication Sig Start Date End Date Taking? Authorizing Provider  albuterol (PROVENTIL HFA;VENTOLIN HFA) 108 (90 Base) MCG/ACT inhaler Inhale 1 puff into the lungs every 6 (six) hours as needed for wheezing or shortness of breath.   Yes [provider]  aspirin EC 81 MG tablet Take 81 mg by mouth daily.   Yes [provider]  diphenhydrAMINE (BENADRYL) 25 MG tablet Take 25 mg by mouth daily as needed for allergies.   Yes [provider]  oxymetazoline (AFRIN) 0.05 % nasal spray Place 1 spray into both nostrils at bedtime.   Yes [provider]  POTASSIUM PO Take 1 tablet by mouth daily.   Yes [provider]    Family History History reviewed. No pertinent family history.  Social History Social History  Substance Use Topics  . Smoking status: Never Smoker  . Smokeless tobacco: Never Used  . Alcohol use No     Allergies   Patient has no known allergies.   Review of Systems Review of Systems  Respiratory: Positive for shortness of breath.   All other systems reviewed and are negative.    Physical Exam Updated Vital Signs BP (!) 137/97 (BP Location: Left Arm)   Pulse 84   Temp 98.7 F (37.1 C) (Oral)   Resp (!) 27   Ht 6\' 1"  (1.854 m)   Wt 90.5 kg (199 lb 8 oz)   SpO2 100%   BMI 26.32 kg/m   Physical Exam  Constitutional:  Slightly tachypneic   HENT:  Head: Normocephalic.  Mouth/Throat: Oropharynx is clear and moist.  Eyes: Pupils are equal, round, and reactive to light. Conjunctivae and EOM are normal.  Neck: Normal range of motion. Neck supple.  Cardiovascular: Normal rate, regular rhythm and normal heart sounds.   Pulmonary/Chest:  Crackles R base, minimal wheezing throughout, slightly tachypneic. No retractions   Abdominal: Soft. Bowel sounds are normal. He exhibits no distension. There is no tenderness. There is no guarding.  Musculoskeletal: Normal range of motion. He exhibits no edema or deformity.  No obvious edema, no calf tenderness   Neurological: He is alert.  Skin: Skin is warm.  Psychiatric: He has a normal mood and affect.  Nursing note and vitals reviewed.    ED Treatments / Results  Labs (all labs ordered are listed, but only abnormal results are displayed) Labs Reviewed  CBC WITH DIFFERENTIAL/PLATELET - Abnormal;  Notable for the following:       Result Value   Platelets 104 (*)    All other components within normal limits  COMPREHENSIVE METABOLIC PANEL - Abnormal; Notable for the following:    Glucose, Bld 101 (*)    BUN 23 (*)    AST 45 (*)    Total Bilirubin 1.8 (*)    GFR calc non Af Amer 56 (*)    All other components within normal limits  BRAIN NATRIURETIC PEPTIDE - Abnormal; Notable for the following:    B Natriuretic Peptide 350.5 (*)    All other components within normal limits  D-DIMER, QUANTITATIVE (NOT AT [facility]) - Abnormal; Notable for the following:    D-Dimer, Quant 1.05 (*)    All other components within normal limits  I-STAT TROPONIN, ED - Abnormal; Notable for the following:    Troponin i, poc 0.40 (*)    All other components within normal limits  PROTIME-INR  I-STAT CG4 LACTIC ACID, ED    EKG  EKG Interpretation  Date/Time:  Monday October 15 2016 08:51:40 EDT Ventricular Rate:  84 PR Interval:    QRS Duration: 129 QT Interval:  404 QTC Calculation: 478 R Axis:   28 Text Interpretation:  Sinus rhythm Ventricular premature complex IVCD, consider atypical RBBB No significant change since last tracing Confirmed by Wandra Arthurs [phone]) on 10/15/2016 9:08:28 AM       Radiology Dg Chest 2 View  Result Date: 10/15/2016 CLINICAL DATA:  Shortness of breath. EXAM: CHEST  2 VIEW COMPARISON:  No prior . FINDINGS: Mediastinum hilar structures normal. Cardiomegaly. Diffuse mild bilateral pulmonary interstitial prominence consistent with mild pulmonary interstitial edema noted . Tiny bilateral pleural effusions cannot be excluded . No pleural effusion or pneumothorax. IMPRESSION: Diffuse mild bilateral pulmonary interstitial prominence consistent mild pulmonary interstitial edema tiny bilateral pleural effusions cannot be excluded. Electronically Signed   By: Marcello Moores  Register   On: 10/15/2016 07:51   Ct Angio Chest Pe W And/or Wo Contrast  Result Date: 10/15/2016 CLINICAL DATA:   Pt reports having increasing shortness of breath over the last several days and is worsening when laying flat and increasing shortness of breath with exertion EXAM: CT ANGIOGRAPHY CHEST WITH CONTRAST TECHNIQUE: Multidetector CT imaging of the chest was performed using the standard protocol during bolus administration of intravenous contrast. Multiplanar CT image reconstructions and MIPs were obtained to evaluate the vascular anatomy. CONTRAST:  100 mL Isovue 370 COMPARISON:  None. FINDINGS: Cardiovascular: Satisfactory opacification of the pulmonary arteries to the segmental level. No evidence of pulmonary embolism. Cardiomegaly. No pericardial effusion. 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 Mediastinum/Nodes: No enlarged mediastinal, hilar, or axillary lymph nodes. Thyroid gland, trachea, and esophagus demonstrate no significant findings. Lungs/Pleura: Small bilateral pleural effusions. Bilateral mild interstitial thickening. Upper  Abdomen: No acute abnormality. Musculoskeletal: No acute osseous abnormality. Large calcified right paracentral disc protrusion at C6-7 extending along the right lateral thecal sac. Review of the MIP images confirms the above findings. IMPRESSION: 1. No evidence of pulmonary embolus. 2. No thoracic aortic aneurysm. 3. Findings concerning for CHF. Electronically Signed   By: Kathreen Devoid   On: 10/15/2016 09:27    Procedures Procedures (including critical care time)  CRITICAL CARE Performed by: Wandra Arthurs   Total critical care time: 30 minutes  Critical care time was exclusive of separately billable procedures and treating other patients.  Critical care was necessary to treat or prevent imminent or life-threatening deterioration.  Critical care was time spent personally by me on the following activities: development of treatment plan with patient and/or surrogate as well as nursing, discussions with consultants, evaluation of patient's response  to treatment, examination of patient, obtaining history from patient or surrogate, ordering and performing treatments and interventions, ordering and review of laboratory studies, ordering and review of radiographic studies, pulse oximetry and re-evaluation of patient's condition.   Medications Ordered in ED Medications  heparin ADULT infusion 100 units/mL (25000 units/235mL sodium chloride 0.45%) (not administered)  heparin bolus via infusion 4,000 Units (not administered)  furosemide (LASIX) injection 40 mg (not administered)  aspirin tablet 325 mg (not administered)  albuterol (PROVENTIL) (2.5 MG/3ML) 0.083% nebulizer solution 5 mg (5 mg Nebulization Given 10/15/16 0805)  ipratropium (ATROVENT) nebulizer solution 0.5 mg (0.5 mg Nebulization Given 10/15/16 0805)  methylPREDNISolone sodium succinate (SOLU-MEDROL) 125 mg/2 mL injection 125 mg (125 mg Intravenous Given 10/15/16 0838)  iopamidol (ISOVUE-370) 76 % injection 100 mL (100 mLs Intravenous Contrast Given 10/15/16 0856)     Initial Impression / Assessment and Plan / ED Course  I have reviewed the triage vital signs and the nursing notes.  Pertinent labs & imaging results that were available during my care of the patient were reviewed by me and considered in my medical decision making (see chart for details).     Jason Day is a 76 y.o. male here with SOB worse at night and worse with exertion. Consider ACS vs new onset CHF vs PE vs asthma or pneumonia. Will get labs, CXR, BNP, d-dimer. Will give nebs, steroids and reassess.   9:44 AM Patient's Trop 0.4. Repeat EKG showed no obvious STEMI. BNP 350. D-dimer positive, but CT angio showed no PE, just CHF. I suspect NSTEMI causing new onset CHF or CHF with demand ischemia. Given ASA, started on heparin drip. Given lasix for diuresis. Called Cardiology to admit for CHF, positive trop.   10:59 AM Dr. Johnsie Cancel saw patient. Recommend hospitalist admission and stress test tomorrow, continue to  trend troponins.   Final Clinical Impressions(s) / ED Diagnoses   Final diagnoses:  None    New Prescriptions New Prescriptions   No medications on file     Drenda Freeze, MD 10/15/16 0973    Drenda Freeze, MD 10/15/16 [phone]

## 2016-10-15 NOTE — Consult Note (Signed)
Cardiology Consultation:   Patient ID: Jason Day; [id number]; 06-Dec-1940   Admit date: 10/15/2016 Date of Consult: 10/15/2016  Primary Care Provider: Patient, No Pcp Per Primary Cardiologist: NEW - Dr. Johnsie Cancel ( has cardiologist in [place].) Primary Electrophysiologist:  NA   Patient Profile:   Jason Day is a 76 y.o. male with a hx of no cardiology problems who is being seen today for the evaluation of acute SOB at the request of Dr. Darl Householder.  History of Present Illness:   Mr. Chasen and his wife just recently moved here from [place].  He has kept up with his health but only has had Lumbar lam.  No HTN, No Heart disease.  He had POET in [place] last year and it was normal.  No meds added.     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  Mostly with exertion.  Was seen at urgent care on [address] on Sat and given steroids and inhaler, may have helped mildly.  Only pain rt mid back lateral. This AM at 0530 he sat up in bed unable to breathe.    No chest pain.     In ER BNP 350, Troponin poc 0.40   EKG SR but no acute changes I personally reviewed.   Lactic acid 1.30  Plts 104 with hgb 15.7 WBC 8.2 DDimer 1.05 CXR 2 view with interstital edema  CTA  of chest  No PE, no thoracic aortic aneurysm findings consistent with CHF.   Here in ER he is feeling better, he has had ASA 325 mg, Lasix 40 mg IV, IV heparin and atrovent neb and solumedrol 125 mg.  Still dyspneic with conversation.  Past Surgical History:  Procedure Laterality Date  . BACK SURGERY    . CHOLECYSTECTOMY    . FOOT SURGERY    . stents in liver       Home Medications:  Prior to Admission medications   Medication Sig Start Date End Date Taking? Authorizing Provider  albuterol (PROVENTIL HFA;VENTOLIN HFA) 108 (90 Base) MCG/ACT inhaler Inhale 1 puff into the lungs every 6 (six) hours as needed for wheezing or shortness of breath.   Yes [provider]    aspirin EC 81 MG tablet Take 81 mg by mouth daily.   Yes [provider]  diphenhydrAMINE (BENADRYL) 25 MG tablet Take 25 mg by mouth daily as needed for allergies.   Yes [provider]  oxymetazoline (AFRIN) 0.05 % nasal spray Place 1 spray into both nostrils at bedtime.   Yes [provider]  POTASSIUM PO Take 1 tablet by mouth daily.   Yes [provider]    Inpatient Medications: Scheduled Meds:  Continuous Infusions: . heparin 1,200 Units/hr (10/15/16 0948)   PRN Meds:   Allergies:   No Known Allergies  Social History:   Social History   Social History  . Marital status: Married    Spouse name: N/A  . Number of children: N/A  . Years of education: N/A   Occupational History  . Not on file.   Social History Main Topics  . Smoking status: Never Smoker  . Smokeless tobacco: Never Used  . Alcohol use No  . Drug use: No  . Sexual activity: Not on file   Other Topics Concern  . Not on file   Social History Narrative  . No narrative on file    Family History:  Family History  Problem Relation Age of Onset  . COPD Father   . Healthy Brother      ROS:  Please see the history of present illness.  ROS  General:no colds or fevers, no weight changes Skin:no rashes or ulcers HEENT:no blurred vision, no congestion CV:see HPI PUL:see HPI GI:no diarrhea constipation or melena, no indigestion GU:no hematuria, no dysuria MS:no joint pain, no claudication Neuro:no syncope, no lightheadedness, no palpitations  Endo:no diabetes, no thyroid disease .     Physical Exam/Data:   Vitals:   10/15/16 0710 10/15/16 0740 10/15/16 1000 10/15/16 1030  BP: (!) 151/90 (!) 137/97 (!) 150/89 (!) 156/96  Pulse: 83 84 92 94  Resp: 18 (!) 27 (!) 26 (!) 23  Temp: (!) 97.5 F (36.4 C) 98.7 F (37.1 C)    TempSrc: Oral Oral    SpO2: 100% 100% 99% 95%  Weight: 199 lb (90.3 kg) 199 lb 8 oz (90.5 kg)    Height: 6\' 1"  (1.854 m) 6\' 1"  (1.854  m)     No intake or output data in the 24 hours ending 10/15/16 1046 Filed Weights   10/15/16 0710 10/15/16 0740  Weight: 199 lb (90.3 kg) 199 lb 8 oz (90.5 kg)   Body mass index is 26.32 kg/m.  General:  Well nourished, well developed, in no acute distress HEENT: normal Lymph: no adenopathy Neck: no JVD Endocrine:  No thryomegaly Vascular: No carotid bruits; 2+ pedal pulses bil.  Cardiac:  normal S1, S2; RRR; no murmur no gallup rub or click. Lungs:  Bilateral breath sounds to auscultation bilaterally, though diminished in bases no wheezing, rhonchi or rales  Abd: soft, nontender, no hepatomegaly  Ext: no edema Musculoskeletal:  No deformities, BUE and BLE strength normal and equal Skin: warm and dry  Neuro: Alert and oriented X 3 MAE follows commands, + facial symmetry Psych:  Normal affect   Relevant CV Studies: Echo pending  Laboratory Data:  Chemistry  Recent Labs Lab 10/15/16 0752  NA 139  K 4.4  CL 105  CO2 25  GLUCOSE 101*  BUN 23*  CREATININE 1.23  CALCIUM 9.1  GFRNONAA 56*  GFRAA >60  ANIONGAP 9     Recent Labs Lab 10/15/16 0752  PROT 7.1  ALBUMIN 4.2  AST 45*  ALT 61  ALKPHOS 73  BILITOT 1.8*   Hematology  Recent Labs Lab 10/15/16 0752  WBC 8.2  RBC 5.19  HGB 15.7  HCT 47.3  MCV 91.1  MCH 30.3  MCHC 33.2  RDW 14.9  PLT 104*   Cardiac EnzymesNo results for input(s): TROPONINI in the last 168 hours.   Recent Labs Lab 10/15/16 0803  TROPIPOC 0.40*    BNP  Recent Labs Lab 10/15/16 0752  BNP 350.5*    DDimer   Recent Labs Lab 10/15/16 0752  DDIMER 1.05*    Radiology/Studies:  Dg Chest 2 View  Result Date: 10/15/2016 CLINICAL DATA:  Shortness of breath. EXAM: CHEST  2 VIEW COMPARISON:  No prior . FINDINGS: Mediastinum hilar structures normal. Cardiomegaly. Diffuse mild bilateral pulmonary interstitial prominence consistent with mild pulmonary interstitial edema noted . Tiny bilateral pleural effusions cannot be  excluded . No pleural effusion or pneumothorax. IMPRESSION: Diffuse mild bilateral pulmonary interstitial prominence consistent mild pulmonary interstitial edema tiny bilateral pleural effusions cannot be excluded. Electronically Signed   By: Marcello Moores  Register   On: 10/15/2016 07:51   Ct Angio Chest Pe W And/or Wo Contrast  Result Date: 10/15/2016 CLINICAL DATA:  Pt reports having increasing shortness of breath over the last several days and is worsening when laying flat and increasing shortness of breath with exertion EXAM: CT ANGIOGRAPHY CHEST WITH CONTRAST TECHNIQUE: Multidetector CT imaging of the chest was performed using the standard protocol during bolus administration of intravenous contrast. Multiplanar CT image reconstructions and MIPs were obtained to evaluate the vascular anatomy. CONTRAST:  100 mL Isovue 370 COMPARISON:  None. FINDINGS: Cardiovascular: Satisfactory opacification of the pulmonary arteries to the segmental level. No evidence of pulmonary embolism. Cardiomegaly. No pericardial effusion. 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 Mediastinum/Nodes: No enlarged mediastinal, hilar, or axillary lymph nodes. Thyroid gland, trachea, and esophagus demonstrate no significant findings. Lungs/Pleura: Small bilateral pleural effusions. Bilateral mild interstitial thickening. Upper Abdomen: No acute abnormality. Musculoskeletal: No acute osseous abnormality. Large calcified right paracentral disc protrusion at C6-7 extending along the right lateral thecal sac. Review of the MIP images confirms the above findings. IMPRESSION: 1. No evidence of pulmonary embolus. 2. No thoracic aortic aneurysm. 3. Findings concerning for CHF. Electronically Signed   By: Kathreen Devoid   On: 10/15/2016 09:27    Assessment and Plan:   1. Acute CHF agree with IV lasix responding to 40 mg IV.  Continue IV lasix 40 bid Will plan for echo - ordered stat. 2. Elevated troponin, possibly related to  HF, checking echo if no WMA will do lexiscan myoview tomorrow if elevated troponin or WMA will do cath.  Agree with IV heparin for now.  Serial troponins.  Dr. Johnsie Cancel is seeing 3. unknown HLD  But wife notes he eats lots of butter, will check 4. Thrombocytopenia  5.  HTN here    Signed, Cecilie Kicks, NP  10/15/2016 10:46 AM   Patient examined chart reviewed. Exam with basilar crackles no murmurs and no LE edema. Reviewed echo that has just been done and EF 30-35%. Acute systolic CHF with elevated troponin. ECG not revealing of acute or subacute MI. Start heparin, coreg and ACE continue diuresis. Discussed right and left heart cath with patient and wife Risks discussed willing to proceed Transfer to [facility] and arrange cath in am   [facility]

## 2016-10-15 NOTE — ED Notes (Signed)
Patient transported to X-ray 

## 2016-10-15 NOTE — H&P (Signed)
History and Physical    Jason Day ZDG:[id number] DOB: 09-03-1940 DOA: 10/15/2016  PCP: Patient, No Pcp Per  Patient coming from: Home  Chief Complaint: SOB  HPI: Jason Day is a 76 y.o. male with no previous medical history presents with recent history of shortness of breath. He and his wife recently relocated from [place] to [place] in June 2018. This summer, patient has been very busy with their new home, renovating and cleaning the attic as well as basement, using many chemicals in humidity. Wife believes that this has contributed to his symptoms. He admits to worsening shortness of breath, but denies any fevers or chills, no chest pains, no nausea or vomiting, no significant or persistent abdominal pain, swelling. He does not take any prescription medications. He was evaluated at an urgent care on Saturday, was given steroids and inhaler, this helped mildly.  ED Course: Labs obtained, d-dimer elevated, chest x-ray as well as CT chest was completed. Negative for PE, but found changes consistent with CHF. Troponin was elevated at 0.4. He was given IV Lasix, started on IV heparin. Cardiology was consulted and evaluated patient in the emergency department.  Review of Systems: As per HPI otherwise 10 point review of systems negative.   History reviewed. No pertinent past medical history.  Past Surgical History:  Procedure Laterality Date  . BACK SURGERY    . CHOLECYSTECTOMY    . FOOT SURGERY    . stents in liver       reports that he has never smoked. He has never used smokeless tobacco. He reports that he does not drink alcohol or use drugs.  No Known Allergies  Family History  Problem Relation Age of Onset  . COPD Father   . Healthy Brother     Prior to Admission medications   Medication Sig Start Date End Date Taking? Authorizing Provider  albuterol (PROVENTIL HFA;VENTOLIN HFA) 108 (90 Base) MCG/ACT inhaler Inhale 1 puff into the lungs every 6 (six) hours as needed for wheezing  or shortness of breath.   Yes [provider]  aspirin EC 81 MG tablet Take 81 mg by mouth daily.   Yes [provider]  diphenhydrAMINE (BENADRYL) 25 MG tablet Take 25 mg by mouth daily as needed for allergies.   Yes [provider]  oxymetazoline (AFRIN) 0.05 % nasal spray Place 1 spray into both nostrils at bedtime.   Yes [provider]  POTASSIUM PO Take 1 tablet by mouth daily.   Yes [provider]    Physical Exam: Vitals:   10/15/16 0740 10/15/16 1000 10/15/16 1030 10/15/16 1100  BP: (!) 137/97 (!) 150/89 (!) 156/96 (!) 152/101  Pulse: 84 92 94 93  Resp: (!) 27 (!) 26 (!) 23 (!) 33  Temp: 98.7 F (37.1 C)     TempSrc: Oral     SpO2: 100% 99% 95% 96%  Weight: 90.5 kg (199 lb 8 oz)     Height: 6\' 1"  (1.854 m)       Constitutional: NAD, calm, comfortable Eyes: PERRL, lids and conjunctivae normal ENMT: Mucous membranes are moist. Posterior pharynx clear of any exudate or lesions.Normal dentition.  Neck: normal, supple, no masses, no thyromegaly Respiratory: no wheezing, +minimal crackles at bases. Normal respiratory effort. No accessory muscle use. On room air. No conversational dyspnea  Cardiovascular: Regular rate and rhythm, no murmurs / rubs / gallops. No extremity edema.  Abdomen: no tenderness, no masses palpated. No hepatosplenomegaly. Bowel sounds positive.  Musculoskeletal: no clubbing /  cyanosis. No joint deformity upper and lower extremities. Normal muscle tone.  Skin: no rashes, lesions, ulcers. No induration Neurologic: CN 2-12 grossly intact. Strength 5/5 in all 4. Speech clear Psychiatric: Normal judgment and insight. Alert and oriented x 3. Normal mood.   Labs on Admission: I have personally reviewed following labs and imaging studies  CBC:  Recent Labs Lab 10/15/16 0752  WBC 8.2  NEUTROABS 5.6  HGB 15.7  HCT 47.3  MCV 91.1  PLT 034*   Basic Metabolic Panel:  Recent Labs Lab 10/15/16 0752  NA 139  K  4.4  CL 105  CO2 25  GLUCOSE 101*  BUN 23*  CREATININE 1.23  CALCIUM 9.1   GFR: Estimated Creatinine Clearance: 58.6 mL/min (by C-G formula based on SCr of 1.23 mg/dL). Liver Function Tests:  Recent Labs Lab 10/15/16 0752  AST 45*  ALT 61  ALKPHOS 73  BILITOT 1.8*  PROT 7.1  ALBUMIN 4.2   No results for input(s): LIPASE, AMYLASE in the last 168 hours. No results for input(s): AMMONIA in the last 168 hours. Coagulation Profile:  Recent Labs Lab 10/15/16 0803  INR 1.09   Cardiac Enzymes: No results for input(s): CKTOTAL, CKMB, CKMBINDEX, TROPONINI in the last 168 hours. BNP (last 3 results) No results for input(s): PROBNP in the last 8760 hours. HbA1C: No results for input(s): HGBA1C in the last 72 hours. CBG: No results for input(s): GLUCAP in the last 168 hours. Lipid Profile: No results for input(s): CHOL, HDL, LDLCALC, TRIG, CHOLHDL, LDLDIRECT in the last 72 hours. Thyroid Function Tests: No results for input(s): TSH, T4TOTAL, FREET4, T3FREE, THYROIDAB in the last 72 hours. Anemia Panel: No results for input(s): VITAMINB12, FOLATE, FERRITIN, TIBC, IRON, RETICCTPCT in the last 72 hours. Urine analysis: No results found for: COLORURINE, APPEARANCEUR, LABSPEC, PHURINE, GLUCOSEU, HGBUR, BILIRUBINUR, KETONESUR, PROTEINUR, UROBILINOGEN, NITRITE, LEUKOCYTESUR Sepsis Labs: !!!!!!!!!!!!!!!!!!!!!!!!!!!!!!!!!!!!!!!!!!!! @LABRCNTIP (procalcitonin:4,lacticidven:4) )No results found for this or any previous visit (from the past 240 hour(s)).   Radiological Exams on Admission: Dg Chest 2 View  Result Date: 10/15/2016 CLINICAL DATA:  Shortness of breath. EXAM: CHEST  2 VIEW COMPARISON:  No prior . FINDINGS: Mediastinum hilar structures normal. Cardiomegaly. Diffuse mild bilateral pulmonary interstitial prominence consistent with mild pulmonary interstitial edema noted . Tiny bilateral pleural effusions cannot be excluded . No pleural effusion or pneumothorax. IMPRESSION:  Diffuse mild bilateral pulmonary interstitial prominence consistent mild pulmonary interstitial edema tiny bilateral pleural effusions cannot be excluded. Electronically Signed   By: Marcello Moores  Register   On: 10/15/2016 07:51   Ct Angio Chest Pe W And/or Wo Contrast  Result Date: 10/15/2016 CLINICAL DATA:  Pt reports having increasing shortness of breath over the last several days and is worsening when laying flat and increasing shortness of breath with exertion EXAM: CT ANGIOGRAPHY CHEST WITH CONTRAST TECHNIQUE: Multidetector CT imaging of the chest was performed using the standard protocol during bolus administration of intravenous contrast. Multiplanar CT image reconstructions and MIPs were obtained to evaluate the vascular anatomy. CONTRAST:  100 mL Isovue 370 COMPARISON:  None. FINDINGS: Cardiovascular: Satisfactory opacification of the pulmonary arteries to the segmental level. No evidence of pulmonary embolism. Cardiomegaly. No pericardial effusion. 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 Mediastinum/Nodes: No enlarged mediastinal, hilar, or axillary lymph nodes. Thyroid gland, trachea, and esophagus demonstrate no significant findings. Lungs/Pleura: Small bilateral pleural effusions. Bilateral mild interstitial thickening. Upper Abdomen: No acute abnormality. Musculoskeletal: No acute osseous abnormality. Large calcified right paracentral disc protrusion at C6-7 extending along the  right lateral thecal sac. Review of the MIP images confirms the above findings. IMPRESSION: 1. No evidence of pulmonary embolus. 2. No thoracic aortic aneurysm. 3. Findings concerning for CHF. Electronically Signed   By: Kathreen Devoid   On: 10/15/2016 09:27    EKG: Independently reviewed. Normal sinus, with PVC. No significant ST changes   Assessment/Plan Principal Problem:   CHF (congestive heart failure) (HCC)   Acute CHF -Echo ordered  -Lasix 40mg  IV BID  -I/Os, daily weight  -Cardiology  consulted  Elevated troponin -Could be demain ischemia from CHF -Echo ordered  -Cardiology planning for lexiscan myoview vs cath. NPO at midnight  -IV heparin  -Trend troponin   Thrombocytopenia -Trend    DVT prophylaxis: IV heparin Code Status: full  Family Communication: wife at bedside Disposition Plan: pending further work up [employer] called: Cardiology  Admission status: Inpatient   Severity of Illness: The appropriate patient status for this patient is INPATIENT. Inpatient status is judged to be reasonable and necessary in order to provide the required intensity of service to ensure the patient's safety. The patient's presenting symptoms, physical exam findings, and initial radiographic and laboratory data in the context of their chronic comorbidities is felt to place them at high risk for further clinical deterioration. Furthermore, it is not anticipated that the patient will be medically stable for discharge from the hospital within 2 midnights of admission. The following factors support the patient status of inpatient.   " The patient's presenting symptoms include shortness of breath. " The initial radiographic and laboratory data are worrisome because of elevated troponin of 0.4. Planning for further cardiac workup   * I certify that at the point of admission it is my clinical judgment that the patient will require inpatient hospital care spanning beyond 2 midnights from the point of admission due to high intensity of service, high risk for further deterioration and high frequency of surveillance required.*   Dessa Phi, DO [facility] [email or web address] Password [facility] 10/15/2016, 11:28 AM

## 2016-10-15 NOTE — ED Notes (Addendum)
Unable to collect labs at this time due to echo

## 2016-10-16 ENCOUNTER — Encounter (HOSPITAL_COMMUNITY)
Admission: EM | Disposition: A | Discharge status: Home / Self Care | Payer: Self-pay | Source: Home / Self Care | Attending: Surgery

## 2016-10-16 DIAGNOSIS — I5023 Acute on chronic systolic (congestive) heart failure: Secondary | ICD-10-CM

## 2016-10-16 DIAGNOSIS — R7989 Other specified abnormal findings of blood chemistry: Secondary | ICD-10-CM

## 2016-10-16 DIAGNOSIS — R778 Other specified abnormalities of plasma proteins: Secondary | ICD-10-CM

## 2016-10-16 DIAGNOSIS — I25758 Atherosclerosis of native coronary artery of transplanted heart with other forms of angina pectoris: Secondary | ICD-10-CM

## 2016-10-16 DIAGNOSIS — I5021 Acute systolic (congestive) heart failure: Secondary | ICD-10-CM

## 2016-10-16 DIAGNOSIS — I5041 Acute combined systolic (congestive) and diastolic (congestive) heart failure: Secondary | ICD-10-CM

## 2016-10-16 DIAGNOSIS — I5043 Acute on chronic combined systolic (congestive) and diastolic (congestive) heart failure: Secondary | ICD-10-CM

## 2016-10-16 DIAGNOSIS — E7800 Pure hypercholesterolemia, unspecified: Secondary | ICD-10-CM

## 2016-10-16 DIAGNOSIS — I2510 Atherosclerotic heart disease of native coronary artery without angina pectoris: Secondary | ICD-10-CM

## 2016-10-16 HISTORY — PX: RIGHT/LEFT HEART CATH AND CORONARY ANGIOGRAPHY: CATH118266

## 2016-10-16 HISTORY — DX: Acute combined systolic (congestive) and diastolic (congestive) heart failure: I50.41

## 2016-10-16 LAB — CBC
HEMATOCRIT: 49.2 % (ref 39.0–52.0)
HEMOGLOBIN: 16.5 g/dL (ref 13.0–17.0)
MCH: 30.4 pg (ref 26.0–34.0)
MCHC: 33.5 g/dL (ref 30.0–36.0)
MCV: 90.6 fL (ref 78.0–100.0)
Platelets: 165 10*3/uL (ref 150–400)
RBC: 5.43 MIL/uL (ref 4.22–5.81)
RDW: 14.4 % (ref 11.5–15.5)
WBC: 10 10*3/uL (ref 4.0–10.5)

## 2016-10-16 LAB — COMPREHENSIVE METABOLIC PANEL
ALK PHOS: 64 U/L (ref 38–126)
ALT: 54 U/L (ref 17–63)
AST: 38 U/L (ref 15–41)
Albumin: 4 g/dL (ref 3.5–5.0)
Anion gap: 11 (ref 5–15)
BILIRUBIN TOTAL: 3.3 mg/dL — AB (ref 0.3–1.2)
BUN: 24 mg/dL — AB (ref 6–20)
CALCIUM: 8.9 mg/dL (ref 8.9–10.3)
CO2: 26 mmol/L (ref 22–32)
Chloride: 102 mmol/L (ref 101–111)
Creatinine, Ser: 1.31 mg/dL — ABNORMAL HIGH (ref 0.61–1.24)
GFR calc Af Amer: 60 mL/min — ABNORMAL LOW (ref >60)
GFR, EST NON AFRICAN AMERICAN: 52 mL/min — AB (ref >60)
GLUCOSE: 88 mg/dL (ref 65–99)
POTASSIUM: 4.1 mmol/L (ref 3.5–5.1)
SODIUM: 139 mmol/L (ref 135–145)
TOTAL PROTEIN: 6.7 g/dL (ref 6.5–8.1)

## 2016-10-16 LAB — POCT I-STAT 3, ART BLOOD GAS (G3+)
ACID-BASE EXCESS: 5 mmol/L — AB (ref 0.0–2.0)
BICARBONATE: 29.1 mmol/L — AB (ref 20.0–28.0)
O2 Saturation: 96 %
TCO2: 30 mmol/L (ref 22–32)
pCO2 arterial: 39.5 mmHg (ref 32.0–48.0)
pH, Arterial: 7.475 — ABNORMAL HIGH (ref 7.350–7.450)
pO2, Arterial: 78 mmHg — ABNORMAL LOW (ref 83.0–108.0)

## 2016-10-16 LAB — POCT I-STAT 3, VENOUS BLOOD GAS (G3P V)
ACID-BASE EXCESS: 7 mmol/L — AB (ref 0.0–2.0)
Bicarbonate: 32.5 mmol/L — ABNORMAL HIGH (ref 20.0–28.0)
O2 SAT: 67 %
PO2 VEN: 35 mmHg (ref 32.0–45.0)
TCO2: 34 mmol/L — ABNORMAL HIGH (ref 22–32)
pCO2, Ven: 49.6 mmHg (ref 44.0–60.0)
pH, Ven: 7.424 (ref 7.250–7.430)

## 2016-10-16 LAB — HEPARIN LEVEL (UNFRACTIONATED)
Heparin Unfractionated: 0.33 IU/mL (ref 0.30–0.70)
Heparin Unfractionated: 0.41 IU/mL (ref 0.30–0.70)

## 2016-10-16 LAB — HEMOGLOBIN A1C
HEMOGLOBIN A1C: 5.5 % (ref 4.8–5.6)
Mean Plasma Glucose: 111.15 mg/dL

## 2016-10-16 LAB — PROTIME-INR
INR: 1.1
Prothrombin Time: 14.1 seconds (ref 11.4–15.2)

## 2016-10-16 LAB — POCT ACTIVATED CLOTTING TIME: ACTIVATED CLOTTING TIME: 175 s

## 2016-10-16 SURGERY — RIGHT/LEFT HEART CATH AND CORONARY ANGIOGRAPHY
Anesthesia: LOCAL

## 2016-10-16 MED ORDER — VERAPAMIL HCL 2.5 MG/ML IV SOLN
INTRAVENOUS | Status: DC | PRN
  Administered 2016-10-16: 10 mL via INTRA_ARTERIAL

## 2016-10-16 MED ORDER — ROSUVASTATIN CALCIUM 10 MG PO TABS
10.0000 mg | ORAL_TABLET | Freq: Every day | ORAL | Status: DC
  Administered 2016-10-16 – 2016-10-23 (×7): 10 mg via ORAL
  Filled 2016-10-16 (×7): qty 1

## 2016-10-16 MED ORDER — LIDOCAINE HCL (PF) 1 % IJ SOLN
INTRAMUSCULAR | Status: AC
  Filled 2016-10-16: qty 30

## 2016-10-16 MED ORDER — SODIUM CHLORIDE 0.9% FLUSH
3.0000 mL | Freq: Two times a day (BID) | INTRAVENOUS | Status: DC
  Administered 2016-10-17 (×2): 3 mL via INTRAVENOUS

## 2016-10-16 MED ORDER — MIDAZOLAM HCL 2 MG/2ML IJ SOLN
INTRAMUSCULAR | Status: AC
  Filled 2016-10-16: qty 2

## 2016-10-16 MED ORDER — LIDOCAINE HCL (PF) 1 % IJ SOLN
INTRAMUSCULAR | Status: DC | PRN
  Administered 2016-10-16: 5 mL via SUBCUTANEOUS

## 2016-10-16 MED ORDER — HEPARIN (PORCINE) IN NACL 2-0.9 UNIT/ML-% IJ SOLN
INTRAMUSCULAR | Status: AC
  Filled 2016-10-16: qty 1000

## 2016-10-16 MED ORDER — ACETAMINOPHEN 325 MG PO TABS: 650.0000 mg | ORAL_TABLET | ORAL | Status: DC | PRN

## 2016-10-16 MED ORDER — FENTANYL CITRATE (PF) 100 MCG/2ML IJ SOLN
INTRAMUSCULAR | Status: DC | PRN
  Administered 2016-10-16: 50 ug via INTRAVENOUS

## 2016-10-16 MED ORDER — FENTANYL CITRATE (PF) 100 MCG/2ML IJ SOLN
INTRAMUSCULAR | Status: AC
  Filled 2016-10-16: qty 2

## 2016-10-16 MED ORDER — IOPAMIDOL (ISOVUE-370) INJECTION 76%
INTRAVENOUS | Status: AC
  Filled 2016-10-16: qty 100

## 2016-10-16 MED ORDER — HEPARIN (PORCINE) IN NACL 2-0.9 UNIT/ML-% IJ SOLN
INTRAMUSCULAR | Status: AC | PRN
  Administered 2016-10-16: 1000 mL

## 2016-10-16 MED ORDER — HEPARIN SODIUM (PORCINE) 1000 UNIT/ML IJ SOLN
INTRAMUSCULAR | Status: AC
Start: 2016-10-16 — End: ?
  Filled 2016-10-16: qty 1

## 2016-10-16 MED ORDER — OXYCODONE HCL 5 MG PO TABS: 5.0000 mg | ORAL_TABLET | ORAL | Status: DC | PRN

## 2016-10-16 MED ORDER — SODIUM CHLORIDE 0.9 % IV SOLN
INTRAVENOUS | Status: AC
  Administered 2016-10-16: 17:00:00 via INTRAVENOUS

## 2016-10-16 MED ORDER — SODIUM CHLORIDE 0.9 % IV SOLN: 250.0000 mL | INTRAVENOUS | Status: DC | PRN

## 2016-10-16 MED ORDER — HEPARIN SODIUM (PORCINE) 5000 UNIT/ML IJ SOLN
5000.0000 [IU] | Freq: Three times a day (TID) | INTRAMUSCULAR | Status: DC
  Administered 2016-10-16 – 2016-10-17 (×3): 5000 [IU] via SUBCUTANEOUS
  Filled 2016-10-16 (×3): qty 1

## 2016-10-16 MED ORDER — SODIUM CHLORIDE 0.9% FLUSH: 3.0000 mL | INTRAVENOUS | Status: DC | PRN

## 2016-10-16 MED ORDER — IOPAMIDOL (ISOVUE-370) INJECTION 76%
INTRAVENOUS | Status: DC | PRN
Start: 2016-10-16 — End: 2016-10-16
  Administered 2016-10-16: 60 mL via INTRA_ARTERIAL

## 2016-10-16 MED ORDER — MIDAZOLAM HCL 2 MG/2ML IJ SOLN
INTRAMUSCULAR | Status: DC | PRN
  Administered 2016-10-16 (×2): 1 mg via INTRAVENOUS

## 2016-10-16 MED ORDER — ASPIRIN 81 MG PO CHEW
81.0000 mg | CHEWABLE_TABLET | Freq: Every day | ORAL | Status: DC
  Administered 2016-10-17: 81 mg via ORAL
  Filled 2016-10-16: qty 1

## 2016-10-16 MED ORDER — ONDANSETRON HCL 4 MG/2ML IJ SOLN: 4.0000 mg | Freq: Four times a day (QID) | INTRAMUSCULAR | Status: DC | PRN

## 2016-10-16 MED ORDER — HEPARIN SODIUM (PORCINE) 1000 UNIT/ML IJ SOLN
INTRAMUSCULAR | Status: DC | PRN
  Administered 2016-10-16: 4500 [IU] via INTRAVENOUS

## 2016-10-16 MED ORDER — VERAPAMIL HCL 2.5 MG/ML IV SOLN
INTRAVENOUS | Status: AC
  Filled 2016-10-16: qty 2

## 2016-10-16 SURGICAL SUPPLY — 14 items
CATH BALLN WEDGE 5F 110CM (CATHETERS) ×2 IMPLANT
CATH EXPO 5F FL3.5 (CATHETERS) ×2 IMPLANT
CATH INFINITI JR4 5F (CATHETERS) ×2 IMPLANT
COVER PRB 48X5XTLSCP FOLD TPE (BAG) ×1 IMPLANT
COVER PROBE 5X48 (BAG) ×1
DEVICE RAD COMP TR BAND LRG (VASCULAR PRODUCTS) ×2 IMPLANT
GLIDESHEATH SLEND A-KIT 6F 22G (SHEATH) ×2 IMPLANT
GUIDEWIRE INQWIRE 1.5J.035X260 (WIRE) ×1 IMPLANT
INQWIRE 1.5J .035X260CM (WIRE) ×2
KIT HEART LEFT (KITS) ×2 IMPLANT
PACK CARDIAC CATHETERIZATION (CUSTOM PROCEDURE TRAY) ×2 IMPLANT
SHEATH GLIDE SLENDER 4/5FR (SHEATH) ×2 IMPLANT
TRANSDUCER W/STOPCOCK (MISCELLANEOUS) ×2 IMPLANT
TUBING CIL FLEX 10 FLL-RA (TUBING) ×2 IMPLANT

## 2016-10-16 NOTE — H&P (View-Only) (Signed)
Progress Note  Patient Name: Jason Day Date of Encounter: 10/16/2016  Primary Cardiologist: Johnsie Cancel  Subjective   Feels much better no chest pain or dyspnea   Inpatient Medications    Scheduled Meds: . carvedilol  3.125 mg Oral BID WC  . furosemide  40 mg Intravenous BID  . losartan  50 mg Oral Daily  . sodium chloride flush  3 mL Intravenous Q12H  . sodium chloride flush  3 mL Intravenous Q12H   Continuous Infusions: . sodium chloride    . sodium chloride    . sodium chloride 10 mL/hr at 10/16/16 0536  . heparin 1,350 Units/hr (10/16/16 0030)   PRN Meds: sodium chloride, sodium chloride, acetaminophen **OR** acetaminophen, albuterol, ondansetron **OR** ondansetron (ZOFRAN) IV, polyethylene glycol, sodium chloride flush, sodium chloride flush   Vital Signs    Vitals:   10/15/16 1900 10/15/16 2000 10/15/16 2143 10/16/16 0517  BP: (!) 146/88 127/78 123/63 119/71  Pulse: 88 77 77 77  Resp: (!) 23 (!) 29 16 18   Temp:   97.8 F (36.6 C) 97.8 F (36.6 C)  TempSrc:   Oral Oral  SpO2: 93% 95% 95% 98%  Weight:   193 lb 6.4 oz (87.7 kg) 190 lb 12.8 oz (86.5 kg)  Height:   6\' 1"  (1.854 m)     Intake/Output Summary (Last 24 hours) at 10/16/16 0852 Last data filed at 10/16/16 0500  Gross per 24 hour  Intake            82.58 ml  Output             3450 ml  Net         -3367.42 ml   Filed Weights   10/15/16 0740 10/15/16 2143 10/16/16 0517  Weight: 199 lb 8 oz (90.5 kg) 193 lb 6.4 oz (87.7 kg) 190 lb 12.8 oz (86.5 kg)    Telemetry    NSR  - Personally Reviewed  ECG    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   Neck: No JVD Cardiac: RRR, no murmurs, rubs, or gallops.  Respiratory: Clear to auscultation bilaterally. GI: Soft, nontender, non-distended  MS: No edema; No deformity. Neuro:  Nonfocal  Psych: Normal affect   Labs    Chemistry Recent Labs Lab 10/15/16 0752  NA 139  K 4.4  CL 105  CO2 25    GLUCOSE 101*  BUN 23*  CREATININE 1.23  CALCIUM 9.1  PROT 7.1  ALBUMIN 4.2  AST 45*  ALT 61  ALKPHOS 73  BILITOT 1.8*  GFRNONAA 56*  GFRAA >60  ANIONGAP 9     Hematology Recent Labs Lab 10/15/16 0752 10/16/16 0516  WBC 8.2 10.0  RBC 5.19 5.43  HGB 15.7 16.5  HCT 47.3 49.2  MCV 91.1 90.6  MCH 30.3 30.4  MCHC 33.2 33.5  RDW 14.9 14.4  PLT 104* 165    Cardiac Enzymes Recent Labs Lab 10/15/16 1155 10/15/16 1827  TROPONINI 0.42* 0.31*    Recent Labs Lab 10/15/16 0803  TROPIPOC 0.40*     BNP Recent Labs Lab 10/15/16 0752  BNP 350.5*     DDimer  Recent Labs Lab 10/15/16 0752  DDIMER 1.05*     Radiology    Dg Chest 2 View  Result Date: 10/15/2016 CLINICAL DATA:  Shortness of breath. EXAM: CHEST  2 VIEW COMPARISON:  No prior . FINDINGS: Mediastinum hilar structures normal. Cardiomegaly. Diffuse mild bilateral  pulmonary interstitial prominence consistent with mild pulmonary interstitial edema noted . Tiny bilateral pleural effusions cannot be excluded . No pleural effusion or pneumothorax. IMPRESSION: Diffuse mild bilateral pulmonary interstitial prominence consistent mild pulmonary interstitial edema tiny bilateral pleural effusions cannot be excluded. Electronically Signed   By: Marcello Moores  Register   On: 10/15/2016 07:51   Ct Angio Chest Pe W And/or Wo Contrast  Result Date: 10/15/2016 CLINICAL DATA:  Pt reports having increasing shortness of breath over the last several days and is worsening when laying flat and increasing shortness of breath with exertion EXAM: CT ANGIOGRAPHY CHEST WITH CONTRAST TECHNIQUE: Multidetector CT imaging of the chest was performed using the standard protocol during bolus administration of intravenous contrast. Multiplanar CT image reconstructions and MIPs were obtained to evaluate the vascular anatomy. CONTRAST:  100 mL Isovue 370 COMPARISON:  None. FINDINGS: Cardiovascular: Satisfactory opacification of the pulmonary arteries to  the segmental level. No evidence of pulmonary embolism. Cardiomegaly. No pericardial effusion. 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 Mediastinum/Nodes: No enlarged mediastinal, hilar, or axillary lymph nodes. Thyroid gland, trachea, and esophagus demonstrate no significant findings. Lungs/Pleura: Small bilateral pleural effusions. Bilateral mild interstitial thickening. Upper Abdomen: No acute abnormality. Musculoskeletal: No acute osseous abnormality. Large calcified right paracentral disc protrusion at C6-7 extending along the right lateral thecal sac. Review of the MIP images confirms the above findings. IMPRESSION: 1. No evidence of pulmonary embolus. 2. No thoracic aortic aneurysm. 3. Findings concerning for CHF. Electronically Signed   By: Kathreen Devoid   On: 10/15/2016 09:27    Cardiac Studies   Echo EF 30-35%   Patient Profile     76 y.o. male with dyspnea and new onset acute systolic CHF Minimal elevation in troponin Echo with moderate LV impairment. Good response to diuresis for cath 10/16/16  Assessment & Plan    1) CHF continue lasix , coreg and ARB improved good diuresis lungs clear 2) Elevated troponin:  For cath today to r/o ischemic DCM   Signed, Jenkins Rouge, MD  10/16/2016, 8:52 AM

## 2016-10-16 NOTE — Progress Notes (Signed)
Pt is alert and oriented with stable low vitals waiting for cath.

## 2016-10-16 NOTE — Progress Notes (Signed)
Pt is aware about the cardiac cath procedure this morning, clipping is done, Consent has been taken, second IV order has been placed, pt is in NPO since midnight, IV heparin is continue @13 .5cc/hr, will continue to monitor the patient

## 2016-10-16 NOTE — Progress Notes (Signed)
Brachial sheath removed at 1525, manual pressure held for 10 minutes. After sheath removal, site was level 0 and a gauze and coban dressing was placed over the site. TR band initially had 10 cc's of air. 2 cc's was removed at 1540 with 8 cc's remaining. Site remained level 0, capillary refill brisk, radial pulse palpable. VS stable and patient was transported back to room 3E 03.

## 2016-10-16 NOTE — Progress Notes (Signed)
Progress Note  Patient Name: Jason Day Date of Encounter: 10/16/2016  Primary Cardiologist: Johnsie Cancel  Subjective   Feels much better no chest pain or dyspnea   Inpatient Medications    Scheduled Meds: . carvedilol  3.125 mg Oral BID WC  . furosemide  40 mg Intravenous BID  . losartan  50 mg Oral Daily  . sodium chloride flush  3 mL Intravenous Q12H  . sodium chloride flush  3 mL Intravenous Q12H   Continuous Infusions: . sodium chloride    . sodium chloride    . sodium chloride 10 mL/hr at 10/16/16 0536  . heparin 1,350 Units/hr (10/16/16 0030)   PRN Meds: sodium chloride, sodium chloride, acetaminophen **OR** acetaminophen, albuterol, ondansetron **OR** ondansetron (ZOFRAN) IV, polyethylene glycol, sodium chloride flush, sodium chloride flush   Vital Signs    Vitals:   10/15/16 1900 10/15/16 2000 10/15/16 2143 10/16/16 0517  BP: (!) 146/88 127/78 123/63 119/71  Pulse: 88 77 77 77  Resp: (!) 23 (!) 29 16 18   Temp:   97.8 F (36.6 C) 97.8 F (36.6 C)  TempSrc:   Oral Oral  SpO2: 93% 95% 95% 98%  Weight:   193 lb 6.4 oz (87.7 kg) 190 lb 12.8 oz (86.5 kg)  Height:   6\' 1"  (1.854 m)     Intake/Output Summary (Last 24 hours) at 10/16/16 0852 Last data filed at 10/16/16 0500  Gross per 24 hour  Intake            82.58 ml  Output             3450 ml  Net         -3367.42 ml   Filed Weights   10/15/16 0740 10/15/16 2143 10/16/16 0517  Weight: 199 lb 8 oz (90.5 kg) 193 lb 6.4 oz (87.7 kg) 190 lb 12.8 oz (86.5 kg)    Telemetry    NSR  - Personally Reviewed  ECG    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   Neck: No JVD Cardiac: RRR, no murmurs, rubs, or gallops.  Respiratory: Clear to auscultation bilaterally. GI: Soft, nontender, non-distended  MS: No edema; No deformity. Neuro:  Nonfocal  Psych: Normal affect   Labs    Chemistry Recent Labs Lab 10/15/16 0752  NA 139  K 4.4  CL 105  CO2 25    GLUCOSE 101*  BUN 23*  CREATININE 1.23  CALCIUM 9.1  PROT 7.1  ALBUMIN 4.2  AST 45*  ALT 61  ALKPHOS 73  BILITOT 1.8*  GFRNONAA 56*  GFRAA >60  ANIONGAP 9     Hematology Recent Labs Lab 10/15/16 0752 10/16/16 0516  WBC 8.2 10.0  RBC 5.19 5.43  HGB 15.7 16.5  HCT 47.3 49.2  MCV 91.1 90.6  MCH 30.3 30.4  MCHC 33.2 33.5  RDW 14.9 14.4  PLT 104* 165    Cardiac Enzymes Recent Labs Lab 10/15/16 1155 10/15/16 1827  TROPONINI 0.42* 0.31*    Recent Labs Lab 10/15/16 0803  TROPIPOC 0.40*     BNP Recent Labs Lab 10/15/16 0752  BNP 350.5*     DDimer  Recent Labs Lab 10/15/16 0752  DDIMER 1.05*     Radiology    Dg Chest 2 View  Result Date: 10/15/2016 CLINICAL DATA:  Shortness of breath. EXAM: CHEST  2 VIEW COMPARISON:  No prior . FINDINGS: Mediastinum hilar structures normal. Cardiomegaly. Diffuse mild bilateral  pulmonary interstitial prominence consistent with mild pulmonary interstitial edema noted . Tiny bilateral pleural effusions cannot be excluded . No pleural effusion or pneumothorax. IMPRESSION: Diffuse mild bilateral pulmonary interstitial prominence consistent mild pulmonary interstitial edema tiny bilateral pleural effusions cannot be excluded. Electronically Signed   By: Marcello Moores  Register   On: 10/15/2016 07:51   Ct Angio Chest Pe W And/or Wo Contrast  Result Date: 10/15/2016 CLINICAL DATA:  Pt reports having increasing shortness of breath over the last several days and is worsening when laying flat and increasing shortness of breath with exertion EXAM: CT ANGIOGRAPHY CHEST WITH CONTRAST TECHNIQUE: Multidetector CT imaging of the chest was performed using the standard protocol during bolus administration of intravenous contrast. Multiplanar CT image reconstructions and MIPs were obtained to evaluate the vascular anatomy. CONTRAST:  100 mL Isovue 370 COMPARISON:  None. FINDINGS: Cardiovascular: Satisfactory opacification of the pulmonary arteries to  the segmental level. No evidence of pulmonary embolism. Cardiomegaly. No pericardial effusion. 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 Mediastinum/Nodes: No enlarged mediastinal, hilar, or axillary lymph nodes. Thyroid gland, trachea, and esophagus demonstrate no significant findings. Lungs/Pleura: Small bilateral pleural effusions. Bilateral mild interstitial thickening. Upper Abdomen: No acute abnormality. Musculoskeletal: No acute osseous abnormality. Large calcified right paracentral disc protrusion at C6-7 extending along the right lateral thecal sac. Review of the MIP images confirms the above findings. IMPRESSION: 1. No evidence of pulmonary embolus. 2. No thoracic aortic aneurysm. 3. Findings concerning for CHF. Electronically Signed   By: Kathreen Devoid   On: 10/15/2016 09:27    Cardiac Studies   Echo EF 30-35%   Patient Profile     76 y.o. male with dyspnea and new onset acute systolic CHF Minimal elevation in troponin Echo with moderate LV impairment. Good response to diuresis for cath 10/16/16  Assessment & Plan    1) CHF continue lasix , coreg and ARB improved good diuresis lungs clear 2) Elevated troponin:  For cath today to r/o ischemic DCM   Signed, Jenkins Rouge, MD  10/16/2016, 8:52 AM

## 2016-10-16 NOTE — Interval H&P Note (Signed)
History and Physical Interval Note: Cath Lab Visit (complete for each Cath Lab visit)  Clinical Evaluation Leading to the Procedure:   ACS: Yes.    Non-ACS:    Anginal Classification: CCS III  Anti-ischemic medical therapy: Maximal Therapy (2 or more classes of medications)  Non-Invasive Test Results: High-risk stress test findings: cardiac mortality >3%/year  Prior CABG: No previous CABG       10/16/2016 1:33 PM  Jason Day  has presented today for surgery, with the diagnosis of cm  The various methods of treatment have been discussed with the patient and family. After consideration of risks, benefits and other options for treatment, the patient has consented to  Procedure(s): LEFT HEART CATH AND CORONARY ANGIOGRAPHY (N/A) as a surgical intervention .  The patient's history has been reviewed, patient examined, no change in status, stable for surgery.  I have reviewed the patient's chart and labs.  Questions were answered to the patient's satisfaction.     Belva Crome III

## 2016-10-16 NOTE — Progress Notes (Signed)
ANTICOAGULATION CONSULT NOTE   Pharmacy Consult for heparin Indication: chest pain/ACS   No Known Allergies  Patient Measurements: Height: 6\' 1"  (185.4 cm) Weight: 190 lb 12.8 oz (86.5 kg) (scale a) IBW/kg (Calculated) : 79.9 Heparin Dosing Weight:90.5 kg  Vital Signs: Temp: 97.8 F (36.6 C) (08/28 0517) Temp Source: Oral (08/28 0517) BP: 119/71 (08/28 0517) Pulse Rate: 77 (08/28 0517)  Labs:  Recent Labs  10/15/16 0752 10/15/16 0803 10/15/16 1155 10/15/16 1827 10/16/16 0516  HGB 15.7  --   --   --  16.5  HCT 47.3  --   --   --  49.2  PLT 104*  --   --   --  165  LABPROT  --  14.1  --   --   --   INR  --  1.09  --   --   --   HEPARINUNFRC  --   --   --  0.28* 0.33  CREATININE 1.23  --   --   --   --   TROPONINI  --   --  0.42* 0.31*  --     Estimated Creatinine Clearance: 58.6 mL/min (by C-G formula based on SCr of 1.23 mg/dL).   Medical History: History reviewed. No pertinent past medical history.   Assessment 76 yo M admitted with SOB.  Pharmacy consulted to dose heparin for ACS/STEMI. No oral anticoagulation per PTA med list.   Heparin level this morning is therapeutic at 0.33 and CBC is stable. No s/s bleeding noted.   Per notes, for cath 8/28.    Goal of Therapy:  Heparin level 0.3-0.7 units/ml Monitor platelets by anticoagulation protocol: Yes   Plan:  Continue heparin gtt at 1350 units/hr Heparin level in 8 hrs Daily heparin level and CBC Monitor s/s bleeding F/u cards plan   Argie Ramming, PharmD Clinical Pharmacist 10/16/16 6:07 AM

## 2016-10-16 NOTE — Progress Notes (Signed)
TCTS to see for CABG eval.

## 2016-10-16 NOTE — Progress Notes (Signed)
New pt admission from ED. Pt brought to the floor in stable condition. Vitals taken. Initial Assessment done. All immediate pertinent needs to patient addressed. Patient Guide given to patient. Important safety instructions relating to hospitalization reviewed with patient. Patient verbalized understanding. Will continue to monitor pt. 

## 2016-10-16 NOTE — Progress Notes (Addendum)
PROGRESS NOTE    Jason Day   IZT:[id number]  DOB: 01/04/1941  DOA: 10/15/2016 PCP: Patient, No Pcp Per   Brief Narrative:  [name] 76 y/o male who recently moved from [place] and has not PMH. He presents for dyspnea on exertion despite having received a steroid shot and inhaler at an urgent care recently. He is found to have CHF and subsequently a depressed EF and WMA on an ECHO. Troponin only mildly elevated.  He has been well diuresed and is awaiting a cath which he is told will be at 4 PM.    Subjective: Breathing much better. No chest pain. Did not have ankle edema.  ROS: no complaints of nausea, vomiting, constipation diarrhea or dysuria. No other complaints.   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 There was   mild concentric hypertrophy. Systolic function was moderately to   severely reduced. The estimated ejection fraction was in the   range of 30% to 35%. Wall motion was normal; there was diffuse   hypokinesis and akinesis of the basal inferior and inferoseptal   walls. Features are consistent with a pseudonormal left   ventricular filling pattern, with concomitant abnormal relaxation   and increased filling pressure (grade 2  diastolic dysfunction).   Doppler parameters are consistent with elevated ventricular   end-diastolic filling pressure. - Aortic valve: Trileaflet; mildly thickened, mildly calcified   leaflets. There was no regurgitation. - Aortic root: The aortic root was normal in size. - Mitral valve: Calcified annulus. Mildly thickened leaflets .   There was mild regurgitation. - Left atrium: The atrium was moderately dilated. - Right ventricle: Systolic function was normal. - Right atrium: The atrium was moderately dilated. - Tricuspid valve: There was mild regurgitation. - Pulmonic valve: There was mild regurgitation. - Inferior vena cava: The vessel was normal in size. - Pericardium, extracardiac: A trivial pericardial effusion was   identified posterior to the heart. There was a large left pleural   effusion. Antimicrobials:  Anti-infectives    None       Objective: Vitals:   10/15/16 1900 10/15/16 2000 10/15/16 2143 10/16/16 0517  BP: (!) 146/88 127/78 123/63 119/71  Pulse: 88 77 77 77  Resp: (!) 23 (!) 29 16 18   Temp:   97.8 F (36.6 C) 97.8 F (36.6 C)  TempSrc:   Oral Oral  SpO2: 93% 95% 95% 98%  Weight:   87.7 kg (193 lb 6.4 oz) 86.5 kg (190 lb 12.8 oz)  Height:   6\' 1"  (1.854 m)     Intake/Output Summary (Last 24 hours) at 10/16/16 0932 Last data filed at 10/16/16 0500  Gross per 24 hour  Intake            82.58 ml  Output  3450 ml  Net         -3367.42 ml   Filed Weights   10/15/16 0740 10/15/16 2143 10/16/16 0517  Weight: 90.5 kg (199 lb 8 oz) 87.7 kg (193 lb 6.4 oz) 86.5 kg (190 lb 12.8 oz)    Examination: General exam: Appears comfortable  HEENT: PERRLA, oral mucosa moist, no sclera icterus or thrush Respiratory system: Clear to auscultation. Respiratory effort normal. Cardiovascular system: S1 & S2 heard, RRR.  No murmurs  Gastrointestinal system: Abdomen soft, non-tender, nondistended. Normal bowel sound. No organomegaly Central nervous system:  Alert and oriented. No focal neurological deficits. Extremities: No cyanosis, clubbing or edema Skin: No rashes or ulcers Psychiatry:  Mood & affect appropriate.     Data Reviewed: I have personally reviewed following labs and imaging studies  CBC:  Recent Labs Lab 10/15/16 0752 10/16/16 0516  WBC 8.2 10.0  NEUTROABS 5.6  --   HGB 15.7 16.5  HCT 47.3 49.2  MCV 91.1 90.6  PLT 104* 993   Basic Metabolic Panel:  Recent Labs Lab 10/15/16 0752 10/15/16 1155  NA 139  --   K 4.4  --   CL 105  --   CO2 25  --   GLUCOSE 101*  --   BUN 23*  --   CREATININE 1.23  --   CALCIUM 9.1  --   MG  --  2.2   GFR: Estimated Creatinine Clearance: 58.6 mL/min (by C-G formula based on SCr of 1.23 mg/dL). Liver Function Tests:  Recent Labs Lab 10/15/16 0752  AST 45*  ALT 61  ALKPHOS 73  BILITOT 1.8*  PROT 7.1  ALBUMIN 4.2   No results for input(s): LIPASE, AMYLASE in the last 168 hours. No results for input(s): AMMONIA in the last 168 hours. Coagulation Profile:  Recent Labs Lab 10/15/16 0803  INR 1.09   Cardiac Enzymes:  Recent Labs Lab 10/15/16 1155 10/15/16 1827  TROPONINI 0.42* 0.31*   BNP (last 3 results) No results for input(s): PROBNP in the last 8760 hours. HbA1C: No results for input(s): HGBA1C in the last 72 hours. CBG: No results for input(s): GLUCAP in the last 168 hours. Lipid Profile:  Recent Labs  10/15/16 1155  CHOL 165  HDL 44  LDLCALC 112*  TRIG 45  CHOLHDL 3.8   Thyroid Function Tests: No results for input(s): TSH, T4TOTAL, FREET4, T3FREE, THYROIDAB in the last 72 hours. Anemia Panel: No results for input(s): VITAMINB12, FOLATE, FERRITIN, TIBC, IRON, RETICCTPCT in the last 72 hours. Urine analysis: No results found for: COLORURINE, APPEARANCEUR, LABSPEC, PHURINE, GLUCOSEU, HGBUR, BILIRUBINUR, KETONESUR, PROTEINUR, UROBILINOGEN, NITRITE, LEUKOCYTESUR Sepsis Labs: @LABRCNTIP (procalcitonin:4,lacticidven:4) )No results found for this  or any previous visit (from the past 240 hour(s)).       Radiology Studies: Dg Chest 2 View  Result Date: 10/15/2016 CLINICAL DATA:  Shortness of breath. EXAM: CHEST  2 VIEW COMPARISON:  No prior . FINDINGS: Mediastinum hilar structures normal. Cardiomegaly. Diffuse mild bilateral pulmonary interstitial prominence consistent with mild pulmonary interstitial edema noted . Tiny bilateral pleural effusions cannot be excluded . No pleural effusion or pneumothorax. IMPRESSION: Diffuse mild bilateral pulmonary interstitial prominence consistent mild pulmonary interstitial edema tiny bilateral pleural effusions cannot be excluded. Electronically Signed   By: Marcello Moores  Register   On: 10/15/2016 07:51   Ct Angio Chest Pe W And/or Wo Contrast  Result Date: 10/15/2016 CLINICAL DATA:  Pt reports having increasing shortness of breath over the last several days and is worsening when laying flat  and increasing shortness of breath with exertion EXAM: CT ANGIOGRAPHY CHEST WITH CONTRAST TECHNIQUE: Multidetector CT imaging of the chest was performed using the standard protocol during bolus administration of intravenous contrast. Multiplanar CT image reconstructions and MIPs were obtained to evaluate the vascular anatomy. CONTRAST:  100 mL Isovue 370 COMPARISON:  None. FINDINGS: Cardiovascular: Satisfactory opacification of the pulmonary arteries to the segmental level. No evidence of pulmonary embolism. Cardiomegaly. No pericardial effusion. 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 Mediastinum/Nodes: No enlarged mediastinal, hilar, or axillary lymph nodes. Thyroid gland, trachea, and esophagus demonstrate no significant findings. Lungs/Pleura: Small bilateral pleural effusions. Bilateral mild interstitial thickening. Upper Abdomen: No acute abnormality. Musculoskeletal: No acute osseous abnormality. Large calcified right paracentral disc protrusion at C6-7 extending along the right lateral thecal  sac. Review of the MIP images confirms the above findings. IMPRESSION: 1. No evidence of pulmonary embolus. 2. No thoracic aortic aneurysm. 3. Findings concerning for CHF. Electronically Signed   By: Kathreen Devoid   On: 10/15/2016 09:27      Scheduled Meds: . carvedilol  3.125 mg Oral BID WC  . furosemide  40 mg Intravenous BID  . losartan  50 mg Oral Daily  . sodium chloride flush  3 mL Intravenous Q12H  . sodium chloride flush  3 mL Intravenous Q12H   Continuous Infusions: . sodium chloride    . sodium chloride    . sodium chloride 10 mL/hr at 10/16/16 0536  . heparin 1,350 Units/hr (10/16/16 0030)     LOS: 1 day    Time spent in minutes: 35    Debbe Odea, MD [facility] Pager: [email or web address] Password [email or web address] 10/16/2016, 9:32 AM

## 2016-10-17 ENCOUNTER — Encounter (HOSPITAL_COMMUNITY): Payer: Self-pay | Admitting: Interventional Cardiology

## 2016-10-17 ENCOUNTER — Other Ambulatory Visit: Payer: Self-pay | Admitting: *Deleted

## 2016-10-17 ENCOUNTER — Inpatient Hospital Stay (HOSPITAL_COMMUNITY): Payer: Medicare Other

## 2016-10-17 DIAGNOSIS — I2510 Atherosclerotic heart disease of native coronary artery without angina pectoris: Secondary | ICD-10-CM

## 2016-10-17 DIAGNOSIS — I5041 Acute combined systolic (congestive) and diastolic (congestive) heart failure: Secondary | ICD-10-CM

## 2016-10-17 DIAGNOSIS — I25110 Atherosclerotic heart disease of native coronary artery with unstable angina pectoris: Secondary | ICD-10-CM

## 2016-10-17 DIAGNOSIS — I350 Nonrheumatic aortic (valve) stenosis: Secondary | ICD-10-CM

## 2016-10-17 DIAGNOSIS — I25758 Atherosclerosis of native coronary artery of transplanted heart with other forms of angina pectoris: Secondary | ICD-10-CM

## 2016-10-17 DIAGNOSIS — Z01810 Encounter for preprocedural cardiovascular examination: Secondary | ICD-10-CM

## 2016-10-17 LAB — VAS US DOPPLER PRE CABG
LCCADDIAS: -10 cm/s
LCCADSYS: -50 cm/s
LCCAPSYS: 57 cm/s
LEFT ECA DIAS: -4 cm/s
LEFT VERTEBRAL DIAS: 15 cm/s
LICADDIAS: -22 cm/s
LICADSYS: -66 cm/s
LICAPSYS: -55 cm/s
Left CCA prox dias: 7 cm/s
Left ICA prox dias: -14 cm/s
RCCADSYS: -80 cm/s
RCCAPSYS: 50 cm/s
RIGHT ECA DIAS: -2 cm/s
RIGHT VERTEBRAL DIAS: -3 cm/s
Right CCA prox dias: 9 cm/s

## 2016-10-17 LAB — PULMONARY FUNCTION TEST
DL/VA % pred: 77 %
DL/VA: 3.69 ml/min/mmHg/L
DLCO unc % pred: 54 %
DLCO unc: 19.78 ml/min/mmHg
FEF 25-75 Post: 1.49 L/sec
FEF 25-75 Pre: 1.5 L/sec
FEF2575-%Change-Post: 0 %
FEF2575-%Pred-Post: 60 %
FEF2575-%Pred-Pre: 60 %
FEV1-%Change-Post: 0 %
FEV1-%Pred-Post: 70 %
FEV1-%Pred-Pre: 69 %
FEV1-Post: 2.4 L
FEV1-Pre: 2.39 L
FEV1FVC-%Change-Post: 0 %
FEV1FVC-%Pred-Pre: 99 %
FEV6-%Change-Post: 1 %
FEV6-%Pred-Post: 74 %
FEV6-%Pred-Pre: 73 %
FEV6-Post: 3.3 L
FEV6-Pre: 3.26 L
FEV6FVC-%Change-Post: 0 %
FEV6FVC-%Pred-Post: 104 %
FEV6FVC-%Pred-Pre: 103 %
FVC-%Change-Post: 0 %
FVC-%Pred-Post: 70 %
FVC-%Pred-Pre: 70 %
FVC-Post: 3.35 L
FVC-Pre: 3.34 L
Post FEV1/FVC ratio: 72 %
Post FEV6/FVC ratio: 99 %
Pre FEV1/FVC ratio: 72 %
Pre FEV6/FVC Ratio: 98 %
RV % pred: 92 %
RV: 2.52 L
TLC % pred: 77 %
TLC: 5.91 L

## 2016-10-17 LAB — BASIC METABOLIC PANEL
ANION GAP: 11 (ref 5–15)
BUN: 30 mg/dL — AB (ref 6–20)
CHLORIDE: 99 mmol/L — AB (ref 101–111)
CO2: 29 mmol/L (ref 22–32)
Calcium: 8.8 mg/dL — ABNORMAL LOW (ref 8.9–10.3)
Creatinine, Ser: 1.28 mg/dL — ABNORMAL HIGH (ref 0.61–1.24)
GFR, EST NON AFRICAN AMERICAN: 53 mL/min — AB (ref >60)
Glucose, Bld: 80 mg/dL (ref 65–99)
POTASSIUM: 3.7 mmol/L (ref 3.5–5.1)
SODIUM: 139 mmol/L (ref 135–145)

## 2016-10-17 LAB — CBC
HCT: 47.1 % (ref 39.0–52.0)
Hemoglobin: 15.5 g/dL (ref 13.0–17.0)
MCH: 29.9 pg (ref 26.0–34.0)
MCHC: 32.9 g/dL (ref 30.0–36.0)
MCV: 90.8 fL (ref 78.0–100.0)
PLATELETS: 164 10*3/uL (ref 150–400)
RBC: 5.19 MIL/uL (ref 4.22–5.81)
RDW: 14.8 % (ref 11.5–15.5)
WBC: 7.6 10*3/uL (ref 4.0–10.5)

## 2016-10-17 LAB — TYPE AND SCREEN
ABO/RH(D): O NEG
Antibody Screen: NEGATIVE

## 2016-10-17 LAB — HEPARIN LEVEL (UNFRACTIONATED)

## 2016-10-17 LAB — ABO/RH: ABO/RH(D): O NEG

## 2016-10-17 MED ORDER — DEXMEDETOMIDINE HCL IN NACL 400 MCG/100ML IV SOLN
0.1000 ug/kg/h | INTRAVENOUS | Status: AC
  Administered 2016-10-18: .3 ug/kg/h via INTRAVENOUS
  Filled 2016-10-17: qty 100

## 2016-10-17 MED ORDER — DEXTROSE 5 % IV SOLN
1.5000 g | INTRAVENOUS | Status: AC
  Administered 2016-10-18: .75 g via INTRAVENOUS
  Administered 2016-10-18: 1.5 g via INTRAVENOUS
  Filled 2016-10-17: qty 1.5

## 2016-10-17 MED ORDER — MAGNESIUM SULFATE 50 % IJ SOLN
40.0000 meq | INTRAMUSCULAR | Status: DC
  Filled 2016-10-17: qty 10

## 2016-10-17 MED ORDER — TRANEXAMIC ACID (OHS) PUMP PRIME SOLUTION
2.0000 mg/kg | INTRAVENOUS | Status: DC
  Filled 2016-10-17: qty 1.69

## 2016-10-17 MED ORDER — EPINEPHRINE PF 1 MG/ML IJ SOLN
0.0000 ug/min | INTRAVENOUS | Status: DC
  Filled 2016-10-17: qty 4

## 2016-10-17 MED ORDER — ALPRAZOLAM 0.25 MG PO TABS: 0.2500 mg | ORAL_TABLET | ORAL | Status: DC | PRN

## 2016-10-17 MED ORDER — CEFUROXIME SODIUM 750 MG IJ SOLR
750.0000 mg | INTRAMUSCULAR | Status: DC
  Filled 2016-10-17: qty 750

## 2016-10-17 MED ORDER — VANCOMYCIN HCL 10 G IV SOLR
1250.0000 mg | INTRAVENOUS | Status: AC
  Administered 2016-10-18: 1250 mg via INTRAVENOUS
  Filled 2016-10-17: qty 1250

## 2016-10-17 MED ORDER — DIAZEPAM 5 MG PO TABS
5.0000 mg | ORAL_TABLET | Freq: Once | ORAL | Status: AC
  Administered 2016-10-18: 5 mg via ORAL
  Filled 2016-10-17: qty 1

## 2016-10-17 MED ORDER — POTASSIUM CHLORIDE 2 MEQ/ML IV SOLN
80.0000 meq | INTRAVENOUS | Status: DC
  Filled 2016-10-17 (×2): qty 40

## 2016-10-17 MED ORDER — SODIUM CHLORIDE 0.9 % IV SOLN
INTRAVENOUS | Status: AC
  Administered 2016-10-18: .9 [IU]/h via INTRAVENOUS
  Filled 2016-10-17: qty 1

## 2016-10-17 MED ORDER — DOPAMINE-DEXTROSE 3.2-5 MG/ML-% IV SOLN
0.0000 ug/kg/min | INTRAVENOUS | Status: AC
  Administered 2016-10-18: 3 ug/kg/min via INTRAVENOUS
  Filled 2016-10-17 (×2): qty 250

## 2016-10-17 MED ORDER — CHLORHEXIDINE GLUCONATE CLOTH 2 % EX PADS
6.0000 | MEDICATED_PAD | Freq: Once | CUTANEOUS | Status: AC
  Administered 2016-10-18: 6 via TOPICAL

## 2016-10-17 MED ORDER — CHLORHEXIDINE GLUCONATE CLOTH 2 % EX PADS
6.0000 | MEDICATED_PAD | Freq: Once | CUTANEOUS | Status: AC
  Administered 2016-10-17: 6 via TOPICAL

## 2016-10-17 MED ORDER — CHLORHEXIDINE GLUCONATE 0.12 % MT SOLN
15.0000 mL | Freq: Once | OROMUCOSAL | Status: AC
  Administered 2016-10-18: 15 mL via OROMUCOSAL
  Filled 2016-10-17: qty 15

## 2016-10-17 MED ORDER — SODIUM CHLORIDE 0.9 % IV SOLN
INTRAVENOUS | Status: DC
  Filled 2016-10-17: qty 30

## 2016-10-17 MED ORDER — TEMAZEPAM 15 MG PO CAPS
15.0000 mg | ORAL_CAPSULE | Freq: Once | ORAL | Status: AC | PRN
  Administered 2016-10-17: 15 mg via ORAL
  Filled 2016-10-17: qty 1

## 2016-10-17 MED ORDER — SODIUM CHLORIDE 0.9 % IV SOLN
30.0000 ug/min | INTRAVENOUS | Status: AC
  Administered 2016-10-18: 30 ug/min via INTRAVENOUS
  Filled 2016-10-17: qty 2

## 2016-10-17 MED ORDER — NITROGLYCERIN IN D5W 200-5 MCG/ML-% IV SOLN
2.0000 ug/min | INTRAVENOUS | Status: DC
  Filled 2016-10-17: qty 250

## 2016-10-17 MED ORDER — PLASMA-LYTE 148 IV SOLN
INTRAVENOUS | Status: DC
  Filled 2016-10-17: qty 2.5

## 2016-10-17 MED ORDER — METOPROLOL TARTRATE 12.5 MG HALF TABLET
12.5000 mg | ORAL_TABLET | Freq: Once | ORAL | Status: AC
  Administered 2016-10-18: 12.5 mg via ORAL
  Filled 2016-10-17: qty 1

## 2016-10-17 MED ORDER — BISACODYL 5 MG PO TBEC
5.0000 mg | DELAYED_RELEASE_TABLET | Freq: Once | ORAL | Status: AC
  Administered 2016-10-17: 5 mg via ORAL
  Filled 2016-10-17: qty 1

## 2016-10-17 MED ORDER — TRANEXAMIC ACID 1000 MG/10ML IV SOLN
1.5000 mg/kg/h | INTRAVENOUS | Status: AC
  Administered 2016-10-18: 1.5 mg/kg/h via INTRAVENOUS
  Filled 2016-10-17: qty 25

## 2016-10-17 MED ORDER — TRANEXAMIC ACID (OHS) BOLUS VIA INFUSION
15.0000 mg/kg | INTRAVENOUS | Status: AC
  Administered 2016-10-18: 1264.5 mg via INTRAVENOUS
  Filled 2016-10-17: qty 1265

## 2016-10-17 NOTE — Progress Notes (Signed)
Progress Note  Patient Name: Jason Day Date of Encounter: 10/17/2016  Primary Cardiologist: Johnsie Cancel  Subjective   Anxious less dyspnea no chest pain   Inpatient Medications    Scheduled Meds: . aspirin  81 mg Oral Daily  . carvedilol  3.125 mg Oral BID WC  . furosemide  40 mg Intravenous BID  . heparin  5,000 Units Subcutaneous Q8H  . losartan  50 mg Oral Daily  . rosuvastatin  10 mg Oral QHS  . sodium chloride flush  3 mL Intravenous Q12H   Continuous Infusions: . sodium chloride     PRN Meds: sodium chloride, acetaminophen **OR** acetaminophen, albuterol, ondansetron **OR** ondansetron (ZOFRAN) IV, oxyCODONE, polyethylene glycol, sodium chloride flush   Vital Signs    Vitals:   10/16/16 1605 10/16/16 2205 10/17/16 0457 10/17/16 0618  BP: 129/80 107/70  (!) 100/55  Pulse:    65  Resp:  16  20  Temp:  98 F (36.7 C)  98.2 F (36.8 C)  TempSrc:  Oral  Oral  SpO2:    100%  Weight:   185 lb 14.4 oz (84.3 kg)   Height:        Intake/Output Summary (Last 24 hours) at 10/17/16 0844 Last data filed at 10/17/16 0100  Gross per 24 hour  Intake              480 ml  Output              200 ml  Net              280 ml   Filed Weights   10/15/16 2143 10/16/16 0517 10/17/16 0457  Weight: 193 lb 6.4 oz (87.7 kg) 190 lb 12.8 oz (86.5 kg) 185 lb 14.4 oz (84.3 kg)    Telemetry    NSR  - Personally Reviewed  ECG    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   Neck: No JVD Cardiac: RRR, no murmurs, rubs, or gallops.  Respiratory: Clear to auscultation bilaterally. GI: Soft, nontender, non-distended  MS: No edema; No deformity. Neuro:  Nonfocal  Psych: Normal affect  Right radial A no hematoma   Labs    Chemistry  Recent Labs Lab 10/15/16 0752 10/16/16 0934 10/17/16 0607  NA 139 139 139  K 4.4 4.1 3.7  CL 105 102 99*  CO2 25 26 29   GLUCOSE 101* 88 80  BUN 23* 24* 30*  CREATININE 1.23 1.31* 1.28*    CALCIUM 9.1 8.9 8.8*  PROT 7.1 6.7  --   ALBUMIN 4.2 4.0  --   AST 45* 38  --   ALT 61 54  --   ALKPHOS 73 64  --   BILITOT 1.8* 3.3*  --   GFRNONAA 56* 52* 53*  GFRAA >60 60* >60  ANIONGAP 9 11 11      Hematology  Recent Labs Lab 10/15/16 0752 10/16/16 0516 10/17/16 0607  WBC 8.2 10.0 7.6  RBC 5.19 5.43 5.19  HGB 15.7 16.5 15.5  HCT 47.3 49.2 47.1  MCV 91.1 90.6 90.8  MCH 30.3 30.4 29.9  MCHC 33.2 33.5 32.9  RDW 14.9 14.4 14.8  PLT 104* 165 164    Cardiac Enzymes  Recent Labs Lab 10/15/16 1155 10/15/16 1827  TROPONINI 0.42* 0.31*     Recent Labs Lab 10/15/16 0803  TROPIPOC 0.40*     BNP  Recent Labs Lab 10/15/16 0752  BNP  350.5*     DDimer   Recent Labs Lab 10/15/16 212 117 [id number]  DDIMER 1.05*     Radiology    Ct Angio Chest Pe W And/or Wo Contrast  Result Date: 10/15/2016 CLINICAL DATA:  Pt reports having increasing shortness of breath over the last several days and is worsening when laying flat and increasing shortness of breath with exertion EXAM: CT ANGIOGRAPHY CHEST WITH CONTRAST TECHNIQUE: Multidetector CT imaging of the chest was performed using the standard protocol during bolus administration of intravenous contrast. Multiplanar CT image reconstructions and MIPs were obtained to evaluate the vascular anatomy. CONTRAST:  100 mL Isovue 370 COMPARISON:  None. FINDINGS: Cardiovascular: Satisfactory opacification of the pulmonary arteries to the segmental level. No evidence of pulmonary embolism. Cardiomegaly. No pericardial effusion. 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 Mediastinum/Nodes: No enlarged mediastinal, hilar, or axillary lymph nodes. Thyroid gland, trachea, and esophagus demonstrate no significant findings. Lungs/Pleura: Small bilateral pleural effusions. Bilateral mild interstitial thickening. Upper Abdomen: No acute abnormality. Musculoskeletal: No acute osseous abnormality. Large calcified right paracentral  disc protrusion at C6-7 extending along the right lateral thecal sac. Review of the MIP images confirms the above findings. IMPRESSION: 1. No evidence of pulmonary embolus. 2. No thoracic aortic aneurysm. 3. Findings concerning for CHF. Electronically Signed   By: Kathreen Devoid   On: 10/15/2016 09:27    Cardiac Studies   Echo EF 30-35%   Patient Profile     76 y.o. male with dyspnea and new onset acute systolic CHF Minimal elevation in troponin Echo with moderate LV impairment. Good response to diuresis for cath 10/16/16  Assessment & Plan    1) CHF continue lasix , coreg and ARB improved good diuresis lungs clear 2) CAD:  Reviewed cath LM and severe 3VD with occluded RCA and distal LAD Given LM Stenosis best option is CABG especially given decreased EF. 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   Spoke with wife on phone They are both pretty overwhelmed by diagnosis and need for surgery  As they just moved here from [place] and have no previous history of heart issues  Dr Cyndia Bent to see latter today. Family wishes and I would agree given CHF, elevated troponin LM disease and lack of warning angina to keep in hospital until surgery done. He has no percutaneous option for revascularization    Signed, Jenkins Rouge, MD  10/17/2016, 8:44 AM

## 2016-10-17 NOTE — Progress Notes (Signed)
PROGRESS NOTE    Jason Day   DEY:[id number]  DOB: Jan 18, 1941  DOA: 10/15/2016 PCP: Patient, No Pcp Per   Brief Narrative:  Jason Day 76 y/o male who recently moved from [place] and has not PMH. He presents for dyspnea on exertion despite having received a steroid shot and inhaler at an urgent care recently. He is found to have CHF and subsequently a depressed EF and WMA on an ECHO. Troponin only mildly elevated.  He has been well diuresed and is awaiting a cath which he is told will be at 4 PM.   Subjective: Pt says he feels much better, he is anxious about possible surgery.  Assessment & Plan:   Acute systolic and diastolic CHF and Elevated troponin - adequately diuresed - dyspnea resolved - newly found EF 30-35%, gr 2dCHF and WMA- awaiting cath which is probably at the end of the day - cont Coreg, Lasix, Aspirin, Losartan, Heparin infusion - LDL 112- needs statin - added crestor - A1c - 5.5% - care manager consulted for PCP - dietary changes discussed- CHF education by RN requested  Severe multivessel CAD  - Cardiology recommending cabg consideration, consulted CTS team to evaluate later today.     DVT prophylaxis: Lovenox Code Status: full code Family Communication:  Disposition Plan: TBD Consultants:   cardiology Procedures:  2 D ECHO: Left ventricle: The cavity size was moderately dilated. There was   mild concentric hypertrophy. Systolic function was moderately to   severely reduced. The estimated ejection fraction was in the   range of 30% to 35%. Wall motion was normal; there was diffuse   hypokinesis and akinesis of the basal inferior and inferoseptal   walls. Features are consistent with a pseudonormal left   ventricular filling pattern, with concomitant abnormal relaxation   and increased filling pressure (grade 2 diastolic dysfunction).   Doppler parameters are consistent with elevated ventricular   end-diastolic filling pressure. - Aortic valve: Trileaflet;  mildly thickened, mildly calcified  leaflets. There was no regurgitation. - Aortic root: The aortic root was normal in size. - Mitral valve: Calcified annulus. Mildly thickened leaflets .  There was mild regurgitation. - Left atrium: The atrium was moderately dilated. - Right ventricle: Systolic function was normal. - Right atrium: The atrium was moderately dilated. - Tricuspid valve: There was mild regurgitation. - Pulmonic valve: There was mild regurgitation. - Inferior vena cava: The vessel was normal in size. - Pericardium, extracardiac: A trivial pericardial effusion was identified posterior to the heart. There was a large left pleural effusion.  Antimicrobials:  Anti-infectives    None     Objective: Vitals:   10/16/16 1605 10/16/16 2205 10/17/16 0457 10/17/16 0618  BP: 129/80 107/70  (!) 100/55  Pulse:    65  Resp:  16  20  Temp:  98 F (36.7 C)  98.2 F (36.8 C)  TempSrc:  Oral  Oral  SpO2:    100%  Weight:   84.3 kg (185 lb 14.4 oz)   Height:        Intake/Output Summary (Last 24 hours) at 10/17/16 0851 Last data filed at 10/17/16 0100  Gross per 24 hour  Intake              480 ml  Output              200 ml  Net              280 ml   [employer]  10/15/16 2143 10/16/16 0517 10/17/16 0457  Weight: 87.7 kg (193 lb 6.4 oz) 86.5 kg (190 lb 12.8 oz) 84.3 kg (185 lb 14.4 oz)    Examination: General exam: Appears comfortable  HEENT: PERRLA, oral mucosa moist, no sclera icterus or thrush Respiratory system: Clear to auscultation. Respiratory effort normal. Cardiovascular system: S1 & S2 heard, RRR.  No murmurs  Gastrointestinal system: Abdomen soft, non-tender, nondistended. Normal bowel sound. No organomegaly Central nervous system: Alert and oriented. No focal neurological deficits. Extremities: No cyanosis, clubbing or edema Skin: No rashes or ulcers Psychiatry:  Mood & affect appropriate.   Data Reviewed: I have personally reviewed following labs and  imaging studies  CBC:  Recent Labs Lab 10/15/16 0752 10/16/16 0516 10/17/16 0607  WBC 8.2 10.0 7.6  NEUTROABS 5.6  --   --   HGB 15.7 16.5 15.5  HCT 47.3 49.2 47.1  MCV 91.1 90.6 90.8  PLT 104* 165 676   Basic Metabolic Panel:  Recent Labs Lab 10/15/16 0752 10/15/16 1155 10/16/16 0934 10/17/16 0607  NA 139  --  139 139  K 4.4  --  4.1 3.7  CL 105  --  102 99*  CO2 25  --  26 29  GLUCOSE 101*  --  88 80  BUN 23*  --  24* 30*  CREATININE 1.23  --  1.31* 1.28*  CALCIUM 9.1  --  8.9 8.8*  MG  --  2.2  --   --    GFR: Estimated Creatinine Clearance: 56.4 mL/min (A) (by C-G formula based on SCr of 1.28 mg/dL (H)). Liver Function Tests:  Recent Labs Lab 10/15/16 0752 10/16/16 0934  AST 45* 38  ALT 61 54  ALKPHOS 73 64  BILITOT 1.8* 3.3*  PROT 7.1 6.7  ALBUMIN 4.2 4.0   No results for input(s): LIPASE, AMYLASE in the last 168 hours. No results for input(s): AMMONIA in the last 168 hours. Coagulation Profile:  Recent Labs Lab 10/15/16 0803 10/16/16 0934  INR 1.09 1.10   Cardiac Enzymes:  Recent Labs Lab 10/15/16 1155 10/15/16 1827  TROPONINI 0.42* 0.31*   BNP (last 3 results) No results for input(s): PROBNP in the last 8760 hours. HbA1C:  Recent Labs  10/16/16 0939  HGBA1C 5.5   CBG: No results for input(s): GLUCAP in the last 168 hours. Lipid Profile:  Recent Labs  10/15/16 1155  CHOL 165  HDL 44  LDLCALC 112*  TRIG 45  CHOLHDL 3.8   Thyroid Function Tests: No results for input(s): TSH, T4TOTAL, FREET4, T3FREE, THYROIDAB in the last 72 hours. Anemia Panel: No results for input(s): VITAMINB12, FOLATE, FERRITIN, TIBC, IRON, RETICCTPCT in the last 72 hours. Urine analysis: No results found for: COLORURINE, APPEARANCEUR, LABSPEC, [place], GLUCOSEU, HGBUR, BILIRUBINUR, KETONESUR, PROTEINUR, UROBILINOGEN, NITRITE, LEUKOCYTESUR  )No results found for this or any previous visit (from the past 240 hour(s)).   Radiology Studies: Ct  Angio Chest Pe W And/or Wo Contrast  Result Date: 10/15/2016 CLINICAL DATA:  Pt reports having increasing shortness of breath over the last several days and is worsening when laying flat and increasing shortness of breath with exertion EXAM: CT ANGIOGRAPHY CHEST WITH CONTRAST TECHNIQUE: Multidetector CT imaging of the chest was performed using the standard protocol during bolus administration of intravenous contrast. Multiplanar CT image reconstructions and MIPs were obtained to evaluate the vascular anatomy. CONTRAST:  100 mL Isovue 370 COMPARISON:  None. FINDINGS: Cardiovascular: Satisfactory opacification of the pulmonary arteries to the segmental level. No evidence of  pulmonary embolism. Cardiomegaly. No pericardial effusion. 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 Mediastinum/Nodes: No enlarged mediastinal, hilar, or axillary lymph nodes. Thyroid gland, trachea, and esophagus demonstrate no significant findings. Lungs/Pleura: Small bilateral pleural effusions. Bilateral mild interstitial thickening. Upper Abdomen: No acute abnormality. Musculoskeletal: No acute osseous abnormality. Large calcified right paracentral disc protrusion at C6-7 extending along the right lateral thecal sac. Review of the MIP images confirms the above findings. IMPRESSION: 1. No evidence of pulmonary embolus. 2. No thoracic aortic aneurysm. 3. Findings concerning for CHF. Electronically Signed   By: Kathreen Devoid   On: 10/15/2016 09:27   Scheduled Meds: . aspirin  81 mg Oral Daily  . carvedilol  3.125 mg Oral BID WC  . furosemide  40 mg Intravenous BID  . heparin  5,000 Units Subcutaneous Q8H  . losartan  50 mg Oral Daily  . rosuvastatin  10 mg Oral QHS  . sodium chloride flush  3 mL Intravenous Q12H   Continuous Infusions: . sodium chloride       LOS: 2 days   Time spent in minutes:   Irwin Brakeman, MD [facility] Pager: [email or web address] Password [facility] 10/17/2016, 8:51 AM

## 2016-10-17 NOTE — Progress Notes (Signed)
CARDIAC REHAB PHASE I   Pt independent prior to admission, tentatively scheduled for CABG tomorrow. Cardiac surgery pre-op education completed. Reviewed IS, sternal precautions, activity progression, cardiac surgery booklet and cardiac surgery guidelines. Pt very receptive, verbalized understanding, declined cardiac surgery videos. Pt in bed, call bell within reach. Will follow.  [id number] Lenna Sciara, RN, BSN 10/17/2016 3:20 PM

## 2016-10-17 NOTE — Progress Notes (Signed)
Pre-op Cardiac Surgery  Carotid Findings:  Bilateral:  1-39% ICA stenosis.  Vertebral artery flow is antegrade.     Upper Extremity Right Left  Brachial Pressures 98 Triphasic 100 Triphasic  Radial Waveforms Triphasic Triphasic  Ulnar Waveforms Triphasic Triphasic  Palmar Arch (Allen's Test) Normal Normal   Findings: Doppler waveforms remained normal bilaterally with both radial and ulnar compressions.     Lower  Extremity Right Left  Dorsalis Pedis 110 Triphasic 92 Triphasic  Posterior Tibial 144 Triphasic 121 Triphasic  Ankle/Brachial Indices 1.44 1.21    Findings:  ABIs and Doppler waveforms indicate normal arterial flow bilaterally at rest.

## 2016-10-17 NOTE — Consult Note (Signed)
[address]       [place] [address]             [phone]      Cardiothoracic Surgery Consultation  Reason for Consult: severe left main and multi-vessel coronary artery disease with moderate to severe LV systolic dysfunction  Referring Physician: Dr. Jenkins Rouge  Jason Day is an 76 y.o. male.  HPI:   The patient is a 76 year old gentleman who recently moved here from [place] with and wife and has no significant medical history other than back surgery. He was followed by a cardiologist in [place] and had a stress test last year that was normal by his report. He has been busy this summer working on his house and did not have any problems until two weeks prior to admission when he began having shortness of breath with exertion. He was seen at an urgent care and given steroids and an inhaler that helped a little. He denies any chest pain or pressure. On the morning of 8/27 he woke up in the morning unable to breath and came to [facility] ER. CXR showed pulmonary edema and CTA of the chest ruled out PE but showed some pulmonary edema. He was treated with lasix and improved. An echo showed an EF of 30-35% with diffuse hypokinesis and akinesis of the basal inferior and inferoseptal walls with no significant valvular abnormality. He was transferred to [facility] and cath yesterday showed 75% ostial and 70% distal LM, 99% mid LAD and 90% distal LAD, an occluded RCA, 75% OM1 and 60% mid LCX. PA pressures were normal with LVEDP of 15.   History reviewed. No pertinent past medical history.  Past Surgical History:  Procedure Laterality Date  . BACK SURGERY    . CHOLECYSTECTOMY    . FOOT SURGERY    . RIGHT/LEFT HEART CATH AND CORONARY ANGIOGRAPHY N/A 10/16/2016   Procedure: RIGHT/LEFT HEART CATH AND CORONARY ANGIOGRAPHY;  Surgeon: Belva Crome, MD;  Location: [facility];  Service: Cardiovascular;  Laterality: N/A;  . stents in liver      Family History  Problem Relation Age of  Onset  . COPD Father   . Healthy Brother     Social History:  reports that he has never smoked. He has never used smokeless tobacco. He reports that he does not drink alcohol or use drugs.  Allergies: No Known Allergies  Medications:  I have reviewed the patient's current medications. Prior to Admission:  Prescriptions Prior to Admission  Medication Sig Dispense Refill Last Dose  . albuterol (PROVENTIL HFA;VENTOLIN HFA) 108 (90 Base) MCG/ACT inhaler Inhale 1 puff into the lungs every 6 (six) hours as needed for wheezing or shortness of breath.   10/15/2016 at Unknown time  . aspirin EC 81 MG tablet Take 81 mg by mouth daily.   10/13/2016  . diphenhydrAMINE (BENADRYL) 25 MG tablet Take 25 mg by mouth daily as needed for allergies.   10/12/2016 at Unknown time  . oxymetazoline (AFRIN) 0.05 % nasal spray Place 1 spray into both nostrils at bedtime.   10/14/2016 at Unknown time  . POTASSIUM PO Take 1 tablet by mouth daily.   10/12/2016   Scheduled: . aspirin  81 mg Oral Daily  . carvedilol  3.125 mg Oral BID WC  . furosemide  40 mg Intravenous BID  . [START ON 10/18/2016] heparin-papaverine-plasmalyte irrigation   Irrigation To OR  . heparin  5,000 Units Subcutaneous Q8H  .  losartan  50 mg Oral Daily  . [START ON 10/18/2016] magnesium sulfate  40 mEq Other To OR  . [START ON 10/18/2016] potassium chloride  80 mEq Other To OR  . rosuvastatin  10 mg Oral QHS  . sodium chloride flush  3 mL Intravenous Q12H  . [START ON 10/18/2016] tranexamic acid  15 mg/kg Intravenous To OR  . [START ON 10/18/2016] tranexamic acid  2 mg/kg Intracatheter To OR   Continuous: . sodium chloride    . [START ON 10/18/2016] cefUROXime (ZINACEF)  IV    . [START ON 10/18/2016] cefUROXime (ZINACEF)  IV    . [START ON 10/18/2016] dexmedetomidine    . [START ON 10/18/2016] DOPamine    . [START ON 10/18/2016] epinephrine    . [START ON 10/18/2016] heparin 30,000 units/NS 1000 mL solution for CELLSAVER    . [START ON 10/18/2016]  insulin (NOVOLIN-R) infusion    . [START ON 10/18/2016] nitroGLYCERIN    . [START ON 10/18/2016] phenylephrine 91m/250mL NS (0.035mml) infusion    . [START ON 10/18/2016] tranexamic acid (CYKLOKAPRON) infusion ([facility])    . [START ON 10/18/2016] vancomycin     PRWCB:JSEGBThloride, acetaminophen **OR** acetaminophen, albuterol, ondansetron **OR** ondansetron (ZOFRAN) IV, oxyCODONE, polyethylene glycol, sodium chloride flush Anti-infectives    Start     Dose/Rate Route Frequency Ordered Stop   10/18/16 0400  vancomycin (VANCOCIN) 1,250 mg in sodium chloride 0.9 % 250 mL IVPB     1,250 mg 166.7 mL/hr over 90 Minutes Intravenous To Surgery 10/17/16 1406 10/19/16 0400   10/18/16 0400  cefUROXime (ZINACEF) 1.5 g in dextrose 5 % 50 mL IVPB     1.5 g 100 mL/hr over 30 Minutes Intravenous To Surgery 10/17/16 1406 10/19/16 0400   10/18/16 0400  cefUROXime (ZINACEF) 750 mg in dextrose 5 % 50 mL IVPB     750 mg 100 mL/hr over 30 Minutes Intravenous To Surgery 10/17/16 1406 10/19/16 0400      Results for orders placed or performed during the hospital encounter of 10/15/16 (from the past 48 hour(s))  Heparin level (unfractionated)     Status: Abnormal   Collection Time: 10/15/16  6:27 PM  Result Value Ref Range   Heparin Unfractionated 0.28 (L) 0.30 - 0.70 IU/mL    Comment:        IF HEPARIN RESULTS ARE BELOW EXPECTED VALUES, AND PATIENT DOSAGE HAS BEEN CONFIRMED, SUGGEST FOLLOW UP TESTING OF ANTITHROMBIN III LEVELS.   Troponin I     Status: Abnormal   Collection Time: 10/15/16  6:27 PM  Result Value Ref Range   Troponin I 0.31 (HH) <0.03 ng/mL    Comment: CRITICAL RESULT CALLED TO, READ BACK BY AND VERIFIED WITH: K Gaspar BiddingN 2024 10/15/16 A NAVARRO   Heparin level (unfractionated)     Status: None   Collection Time: 10/16/16  5:16 AM  Result Value Ref Range   Heparin Unfractionated 0.33 0.30 - 0.70 IU/mL    Comment:        IF HEPARIN RESULTS ARE BELOW EXPECTED VALUES, AND PATIENT DOSAGE  HAS BEEN CONFIRMED, SUGGEST FOLLOW UP TESTING OF ANTITHROMBIN III LEVELS.   CBC     Status: None   Collection Time: 10/16/16  5:16 AM  Result Value Ref Range   WBC 10.0 4.0 - 10.5 K/uL   RBC 5.43 4.22 - 5.81 MIL/uL   Hemoglobin 16.5 13.0 - 17.0 g/dL   HCT 49.2 39.0 - 52.0 %   MCV 90.6 78.0 - 100.0 fL  MCH 30.4 26.0 - 34.0 pg   MCHC 33.5 30.0 - 36.0 g/dL   RDW 14.4 11.5 - 15.5 %   Platelets 165 150 - 400 K/uL  Protime-INR     Status: None   Collection Time: 10/16/16  9:34 AM  Result Value Ref Range   Prothrombin Time 14.1 11.4 - 15.2 seconds   INR 1.10   Comprehensive metabolic panel     Status: Abnormal   Collection Time: 10/16/16  9:34 AM  Result Value Ref Range   Sodium 139 135 - 145 mmol/L   Potassium 4.1 3.5 - 5.1 mmol/L   Chloride 102 101 - 111 mmol/L   CO2 26 22 - 32 mmol/L   Glucose, Bld 88 65 - 99 mg/dL   BUN 24 (H) 6 - 20 mg/dL   Creatinine, Ser 1.31 (H) 0.61 - 1.24 mg/dL   Calcium 8.9 8.9 - 10.3 mg/dL   Total Protein 6.7 6.5 - 8.1 g/dL   Albumin 4.0 3.5 - 5.0 g/dL   AST 38 15 - 41 U/L   ALT 54 17 - 63 U/L   Alkaline Phosphatase 64 38 - 126 U/L   Total Bilirubin 3.3 (H) 0.3 - 1.2 mg/dL   GFR calc non Af Amer 52 (L) >60 mL/min   GFR calc Af Amer 60 (L) >60 mL/min    Comment: (NOTE) The eGFR has been calculated using the CKD EPI equation. This calculation has not been validated in all clinical situations. eGFR's persistently <60 mL/min signify possible Chronic Kidney Disease.    Anion gap 11 5 - 15  Hemoglobin A1c     Status: None   Collection Time: 10/16/16  9:39 AM  Result Value Ref Range   Hgb A1c MFr Bld 5.5 4.8 - 5.6 %    Comment: (NOTE) Pre diabetes:          5.7%-6.4% Diabetes:              >6.4% Glycemic control for   <7.0% adults with diabetes    Mean Plasma Glucose 111.15 mg/dL  Heparin level (unfractionated)     Status: None   Collection Time: 10/16/16  1:03 PM  Result Value Ref Range   Heparin Unfractionated 0.41 0.30 - 0.70 IU/mL     Comment:        IF HEPARIN RESULTS ARE BELOW EXPECTED VALUES, AND PATIENT DOSAGE HAS BEEN CONFIRMED, SUGGEST FOLLOW UP TESTING OF ANTITHROMBIN III LEVELS.   I-STAT 3, venous blood gas (G3P V)     Status: Abnormal   Collection Time: 10/16/16  2:43 PM  Result Value Ref Range   pH, Ven 7.424 7.250 - 7.430   pCO2, Ven 49.6 44.0 - 60.0 mmHg   pO2, Ven 35.0 32.0 - 45.0 mmHg   Bicarbonate 32.5 (H) 20.0 - 28.0 mmol/L   TCO2 34 (H) 22 - 32 mmol/L   O2 Saturation 67.0 %   Acid-Base Excess 7.0 (H) 0.0 - 2.0 mmol/L   Patient temperature HIDE    Sample type VENOUS    Comment NOTIFIED PHYSICIAN   I-STAT 3, arterial blood gas (G3+)     Status: Abnormal   Collection Time: 10/16/16  2:48 PM  Result Value Ref Range   pH, Arterial 7.475 (H) 7.350 - 7.450   pCO2 arterial 39.5 32.0 - 48.0 mmHg   pO2, Arterial 78.0 (L) 83.0 - 108.0 mmHg   Bicarbonate 29.1 (H) 20.0 - 28.0 mmol/L   TCO2 30 22 - 32 mmol/L   O2 Saturation  96.0 %   Acid-Base Excess 5.0 (H) 0.0 - 2.0 mmol/L   Patient temperature HIDE    Sample type ARTERIAL   POCT Activated clotting time     Status: None   Collection Time: 10/16/16  3:01 PM  Result Value Ref Range   Activated Clotting Time 175 seconds  CBC     Status: None   Collection Time: 10/17/16  6:07 AM  Result Value Ref Range   WBC 7.6 4.0 - 10.5 K/uL   RBC 5.19 4.22 - 5.81 MIL/uL   Hemoglobin 15.5 13.0 - 17.0 g/dL   HCT 47.1 39.0 - 52.0 %   MCV 90.8 78.0 - 100.0 fL   MCH 29.9 26.0 - 34.0 pg   MCHC 32.9 30.0 - 36.0 g/dL   RDW 14.8 11.5 - 15.5 %   Platelets 164 150 - 400 K/uL  Heparin level (unfractionated)     Status: Abnormal   Collection Time: 10/17/16  6:07 AM  Result Value Ref Range   Heparin Unfractionated <0.10 (L) 0.30 - 0.70 IU/mL    Comment:        IF HEPARIN RESULTS ARE BELOW EXPECTED VALUES, AND PATIENT DOSAGE HAS BEEN CONFIRMED, SUGGEST FOLLOW UP TESTING OF ANTITHROMBIN III LEVELS.   Basic metabolic panel     Status: Abnormal   Collection Time:  10/17/16  6:07 AM  Result Value Ref Range   Sodium 139 135 - 145 mmol/L   Potassium 3.7 3.5 - 5.1 mmol/L   Chloride 99 (L) 101 - 111 mmol/L   CO2 29 22 - 32 mmol/L   Glucose, Bld 80 65 - 99 mg/dL   BUN 30 (H) 6 - 20 mg/dL   Creatinine, Ser 1.28 (H) 0.61 - 1.24 mg/dL   Calcium 8.8 (L) 8.9 - 10.3 mg/dL   GFR calc non Af Amer 53 (L) >60 mL/min   GFR calc Af Amer >60 >60 mL/min    Comment: (NOTE) The eGFR has been calculated using the CKD EPI equation. This calculation has not been validated in all clinical situations. eGFR's persistently <60 mL/min signify possible Chronic Kidney Disease.    Anion gap 11 5 - 15    No results found.  Review of Systems  Constitutional: Negative for chills, diaphoresis, fever and malaise/fatigue.  Eyes: Negative.   Respiratory: Positive for shortness of breath. Negative for cough.   Cardiovascular: Positive for orthopnea. Negative for chest pain, palpitations and leg swelling.  Gastrointestinal: Negative.   Genitourinary: Negative.   Skin: Negative.   Neurological: Negative.  Negative for weakness.  Endo/Heme/Allergies: Negative.   Psychiatric/Behavioral: Negative.    Blood pressure (!) 100/55, pulse 65, temperature 98.2 F (36.8 C), temperature source Oral, resp. rate 20, height 6' 1"  (1.854 m), weight 84.3 kg (185 lb 14.4 oz), SpO2 100 %. Physical Exam  Constitutional: He is oriented to person, place, and time. He appears well-developed and well-nourished. No distress.  HENT:  Head: Normocephalic and atraumatic.  Mouth/Throat: Oropharynx is clear and moist.  Eyes: Pupils are equal, round, and reactive to light. EOM are normal.  Neck: Normal range of motion. Neck supple. No JVD present. No tracheal deviation present. No thyromegaly present.  Cardiovascular: Normal rate, regular rhythm, normal heart sounds and intact distal pulses.   No murmur heard. Respiratory: Effort normal and breath sounds normal. No respiratory distress. He has no  wheezes. He has no rales.  GI: Soft. Bowel sounds are normal. He exhibits no distension and no mass. There is no tenderness.  Musculoskeletal:  Normal range of motion. He exhibits no edema.  Lymphadenopathy:    He has no cervical adenopathy.  Neurological: He is alert and oriented to person, place, and time. He has normal strength. No cranial nerve deficit or sensory deficit.  Skin: Skin is warm and dry.  Psychiatric: He has a normal mood and affect.             *[employer].                        [place] [address]                            [phone]  ------------------------------------------------------------------- Transthoracic Echocardiography  Patient:    Jason Day, Jason Day MR #:       [id number] Study Date: 10/15/2016 Gender:     M Age:        4 Height:     185.4 cm Weight:     90.5 kg BSA:        2.17 m^2 Pt. Status: Room:   Andria Rhein, Otilio Carpen  PERFORMING   [facility], Inpatient  SONOGRAPHER  Darlina Sicilian, RDCS  cc:  ------------------------------------------------------------------- LV EF: 30% -   35%  ------------------------------------------------------------------- Indications:      Dyspnea 786.09.  ------------------------------------------------------------------- History:   PMH:  No prior cardiac history.  ------------------------------------------------------------------- Study Conclusions  - Left ventricle: The cavity size was moderately dilated. There was   mild concentric hypertrophy. Systolic function was moderately to   severely reduced. The estimated ejection fraction was in the   range of 30% to 35%. Wall motion was normal; there was diffuse   hypokinesis and akinesis of the basal inferior and inferoseptal   walls. Features are consistent with a pseudonormal left   ventricular filling pattern, with concomitant abnormal relaxation   and  increased filling pressure (grade 2 diastolic dysfunction).   Doppler parameters are consistent with elevated ventricular   end-diastolic filling pressure. - Aortic valve: Trileaflet; mildly thickened, mildly calcified   leaflets. There was no regurgitation. - Aortic root: The aortic root was normal in size. - Mitral valve: Calcified annulus. Mildly thickened leaflets .   There was mild regurgitation. - Left atrium: The atrium was moderately dilated. - Right ventricle: Systolic function was normal. - Right atrium: The atrium was moderately dilated. - Tricuspid valve: There was mild regurgitation. - Pulmonic valve: There was mild regurgitation. - Inferior vena cava: The vessel was normal in size. - Pericardium, extracardiac: A trivial pericardial effusion was   identified posterior to the heart. There was a large left pleural   effusion.  ------------------------------------------------------------------- Study data:  No prior study was available for comparison.  Study status:  STAT.  Procedure:  The patient reported no pain pre or post test. Transthoracic echocardiography. Image quality was adequate.  Study completion:  There were no complications. Transthoracic echocardiography.  M-mode, complete 2D, spectral Doppler, and color Doppler.  Birthdate:  Patient birthdate: [birth year].  Age:  Patient is 76 yr old.  Sex:  Gender: male. BMI: 26.3 kg/m^2.  Blood pressure:     152/101  Patient status: Inpatient.  Study date:  Study date: 10/15/2016. Study time: 11:20 AM.  Location:  Emergency department.  -------------------------------------------------------------------  ------------------------------------------------------------------- Left ventricle:  The cavity size was moderately dilated. There was mild concentric hypertrophy. Systolic function was moderately to severely reduced. The estimated ejection fraction was in the range of 30% to 35%. Wall motion was normal; there was  diffuse hypokinesis and akinesis of the basal inferior and inferoseptal walls. Features are consistent with a pseudonormal left ventricular filling pattern, with concomitant abnormal relaxation and increased filling pressure (grade 2 diastolic dysfunction). Doppler parameters are consistent with elevated ventricular end-diastolic filling pressure.  ------------------------------------------------------------------- Aortic valve:   Trileaflet; mildly thickened, mildly calcified leaflets. Mobility was not restricted.  Doppler:  Transvalvular velocity was within the normal range. There was no stenosis. There was no regurgitation.  ------------------------------------------------------------------- Aorta:  Aortic root: The aortic root was normal in size.  ------------------------------------------------------------------- Mitral valve:   Calcified annulus. Mildly thickened leaflets . Mobility was not restricted.  Doppler:  Transvalvular velocity was within the normal range. There was no evidence for stenosis. There was mild regurgitation.  ------------------------------------------------------------------- Left atrium:  The atrium was moderately dilated.  ------------------------------------------------------------------- Right ventricle:  The cavity size was normal. Wall thickness was normal. Systolic function was normal.  ------------------------------------------------------------------- Pulmonic valve:    Structurally normal valve.   Cusp separation was normal.  Doppler:  Transvalvular velocity was within the normal range. There was no evidence for stenosis. There was mild regurgitation.  ------------------------------------------------------------------- Tricuspid valve:   Structurally normal valve.    Doppler: Transvalvular velocity was within the normal range. There was  mild regurgitation.  ------------------------------------------------------------------- Pulmonary artery:   The main pulmonary artery was normal-sized. Systolic pressure was within the normal range.  ------------------------------------------------------------------- Right atrium:  The atrium was moderately dilated.  ------------------------------------------------------------------- Pericardium:  A trivial pericardial effusion was identified posterior to the heart.  ------------------------------------------------------------------- Systemic veins: Inferior vena cava: The vessel was normal in size.  ------------------------------------------------------------------- Pleura:  There was a large left pleural effusion.  ------------------------------------------------------------------- Measurements   Left ventricle                         Value        Reference  LV ID, ED, PLAX chordal        (H)     60.7  mm     43 - 52  LV ID, ES, PLAX chordal        (H)     54.9  mm     23 - 38  LV fx shortening, PLAX chordal (L)     10    %      >=29  LV PW thickness, ED                    11.9  mm     ---------  IVS/LV PW ratio, ED                    1.25         <=1.3  LV e&', lateral                         12.5  cm/s   ---------  LV E/e&', lateral                       0.36         ---------  LV e&', medial  4.46  cm/s   ---------  LV E/e&', medial                        1            ---------  LV e&', average                         8.48  cm/s   ---------  LV E/e&', average                       0.53         ---------    Ventricular septum                     Value        Reference  IVS thickness, ED                      14.9  mm     ---------    LVOT                                   Value        Reference  LVOT ID, S                             22    mm     ---------  LVOT area                              3.8   cm^2   ---------  LVOT mean velocity,  S                  61.9  cm/s   ---------  LVOT peak gradient, S                  3     mm Hg  ---------  LVOT mean gradient, S                  2     mm Hg  ---------    Aorta                                  Value        Reference  Aortic root ID                         32    mm     ---------    Left atrium                            Value        Reference  LA ID, A-P, ES                         48    mm     ---------  LA ID/bsa, A-P                 (H)     2.21  cm/m^2 <=2.2  LA volume, ES, 1-p  A4C                 96.5  ml     ---------  LA volume/bsa, ES, 1-p A4C             44.5  ml/m^2 ---------  LA volume, ES, 1-p A2C                 96.5  ml     ---------  LA volume/bsa, ES, 1-p A2C             44.5  ml/m^2 ---------    Mitral valve                           Value        Reference  Mitral E-wave peak velocity            4.46  cm/s   ---------  Mitral deceleration time               211   ms     150 - 230  Aliasing velocity, MR PISA             42.8  cm/s   ---------  Mitral regurg PISA radius              5     mm     ---------  Mitral ERO, PISA                       13    cm^2   ---------  Mitral regurg volume, PISA             18    ml     ---------  Legend: (L)  and  (H)  mark values outside specified reference range.  ------------------------------------------------------------------- Prepared and Electronically Authenticated by  Ena Dawley, M.D. 2018-08-27T12:29:29    Panel Physicians Referring Physician Case Authorizing Physician  Belva Crome, MD (Primary)    Procedures   RIGHT/LEFT HEART CATH AND CORONARY ANGIOGRAPHY  Conclusion    Severe, calcific multivessel coronary artery disease.  Ostial and distal left main stenoses greater than 50%. Depending upon view/ In worst projection ostial left main is 75% and distal is 70% obstructed.  99% mid LAD and 90% distal LAD. The apical LAD is totally occluded.  75% ostial first obtuse marginal, 60% mid  circumflex, and 50% ostial second obtuse marginal.  Total occlusion of the mid RCA with faint left-to-right collaterals into what appears to be a relatively small PDA and LV branch.  Normal pulmonary artery pressures.  Ischemic cardiomyopathy with normal left ventricular filling pressures and LVEDP of 15 mmHg suggesting excellent therapy of acute on chronic systolic heart failure.  RECOMMENDATIONS:   Heart team approach to determine if/which revascularization strategies are indicated.  Indications   Ischemic cardiomyopathy [I25.5 (ICD-10-CM)]  CAD in native artery [I25.10 (ICD-10-CM)]  Acute on chronic systolic heart failure (HCC) [I50.23 (ICD-10-CM)]  Procedural Details/Technique   Technical Details The right radial area was sterilely prepped and draped. Intravenous sedation with Versed and fentanyl was administered. 1% Xylocaine was infiltrated to achieve local analgesia. Using real-time vascular ultrasound, a double wall stick with an angiocath was utilized to obtain intra-arterial access. The modified Seldinger technique was used to place a 86F " Slender" sheath in the right radial artery. Weight based heparin was administered. Coronary angiography was done using 5 F catheters. Right coronary angiography was performed  with a JR4. Left ventricular hemodymic recordings and angiography was done using the JR 4 catheter and hand injection. Left coronary angiography was performed with a JL 3.5 cm.  Hemostasis was achieved using a pneumatic band.  During this procedure the patient is administered a total of Versed 2 mg and Fentanyl 50 mg to achieve and maintain moderate conscious sedation. The patient's heart rate, blood pressure, and oxygen saturation are monitored continuously during the procedure. The period of conscious sedation is 44 minutes, of which I was present face-to-face 100% of this time.   Estimated blood loss <50 mL.  During this procedure the patient was administered the  following to achieve and maintain moderate conscious sedation: Versed 2 mg, Fentanyl 50 mcg, while the patient's heart rate, blood pressure, and oxygen saturation were continuously monitored. The period of conscious sedation was 44 minutes, of which I was present face-to-face 100% of this time.    Coronary Findings   Dominance: Co-dominant  Left Main  Ost LM to LM lesion, 60% stenosed. The lesion is eccentric. The lesion is calcified.  LM lesion, 65% stenosed.  Left Anterior Descending  Mid LAD lesion, 99% stenosed.  Dist LAD-1 lesion, 80% stenosed.  Dist LAD-2 lesion, 99% stenosed.  First Diagonal Branch  Vessel is small in size.  Second Diagonal Branch  2nd Diag lesion, 75% stenosed.  Left Circumflex  Mid Cx lesion, 60% stenosed.  First Obtuse Marginal Branch  Ost 1st Mrg to 1st Mrg lesion, 75% stenosed.  Second Obtuse Marginal Branch  Ost 2nd Mrg lesion, 50% stenosed.  Right Coronary Artery  Mid RCA lesion, 100% stenosed.  Dist RCA lesion, 100% stenosed.  Right Heart   Right Heart Pressures Hemodynamic findings consistent with pulmonary hypertension. LV EDP is normal.    Left Heart   Left Ventricle The ejection fraction could not be assessed due to Contrast injection was not performed.    Coronary Diagrams   Diagnostic Diagram       Implants     No implant documentation for this case.  PACS Images   Show images for CARDIAC CATHETERIZATION   Link to Procedure Log   Procedure Log    Hemo Data    Most Recent Value  Fick Cardiac Output 4.33 L/min  Fick Cardiac Output Index 2.04 (L/min)/BSA  RA A Wave 4 mmHg  RA V Wave 2 mmHg  RA Mean 1 mmHg  RV Systolic Pressure 17 mmHg  RV Diastolic Pressure 0 mmHg  RV EDP 4 mmHg  PA Systolic Pressure 27 mmHg  PA Diastolic Pressure 8 mmHg  PA Mean 15 mmHg  PW A Wave 11 mmHg  PW V Wave 11 mmHg  PW Mean 10 mmHg  AO Systolic Pressure 96 mmHg  AO Diastolic Pressure 59 mmHg  AO Mean 76 mmHg  LV Systolic Pressure 956  mmHg  LV Diastolic Pressure 4 mmHg  LV EDP 15 mmHg  Arterial Occlusion Pressure Extended Systolic Pressure 97 mmHg  Arterial Occlusion Pressure Extended Diastolic Pressure 9 mmHg  Arterial Occlusion Pressure Extended Mean Pressure 54 mmHg  Left Ventricular Apex Extended Systolic Pressure 91 mmHg  Left Ventricular Apex Extended Diastolic Pressure 6 mmHg  Left Ventricular Apex Extended EDP Pressure 16 mmHg  QP/QS 1  TPVR Index 7.34 HRUI  TSVR Index 31.8 HRUI  TPVR/TSVR Ratio 0.23    Assessment/Plan:  This 76 year old gentleman has high grade and left main and severe 3-vessel coronary artery disease with moderate to severe LV systolic dysfunction and grade  2 diastolic dysfunction. He presented with recent onset of progressive shortness of breath due to acute on chronic combined systolic and diastolic heart failure. He has not had any chest pain or pressure. I agree that CABG is the best treatment for preventing further loss of myocardium and hopefully it will improve his EF. I reviewed the echo and cath findings with him and his wife and answered their questions.  I discussed the operative procedure with the patient and his wife including alternatives, benefits and risks; including but not limited to bleeding, blood transfusion, infection, stroke, myocardial infarction, graft failure, heart block requiring a permanent pacemaker, organ dysfunction, and death.  [name] understands and agrees to proceed.  Plan CABG in the am.   I spent 60 minutes performing this consultation and > 50% of this time was spent face to face counseling and coordinating the care of this patient's severe left main and multi-vessel coronary artery disease.  Jason Day 10/17/2016, 5:39 PM

## 2016-10-18 ENCOUNTER — Inpatient Hospital Stay (HOSPITAL_COMMUNITY): Payer: Medicare Other

## 2016-10-18 ENCOUNTER — Inpatient Hospital Stay (HOSPITAL_COMMUNITY): Payer: Medicare Other | Admitting: Anesthesiology

## 2016-10-18 ENCOUNTER — Inpatient Hospital Stay (HOSPITAL_COMMUNITY)
Admission: EM | Disposition: A | Discharge status: Home / Self Care | Payer: Self-pay | Source: Home / Self Care | Attending: Surgery

## 2016-10-18 DIAGNOSIS — Z951 Presence of aortocoronary bypass graft: Secondary | ICD-10-CM

## 2016-10-18 HISTORY — PX: TEE WITHOUT CARDIOVERSION: SHX5443

## 2016-10-18 HISTORY — PX: CORONARY ARTERY BYPASS GRAFT: SHX141

## 2016-10-18 LAB — POCT I-STAT, CHEM 8
BUN: 34 mg/dL — AB (ref 6–20)
BUN: 27 mg/dL — ABNORMAL HIGH (ref 6–20)
BUN: 32 mg/dL — ABNORMAL HIGH (ref 6–20)
BUN: 28 mg/dL — ABNORMAL HIGH (ref 6–20)
BUN: 30 mg/dL — ABNORMAL HIGH (ref 6–20)
BUN: 30 mg/dL — ABNORMAL HIGH (ref 6–20)
BUN: 29 mg/dL — ABNORMAL HIGH (ref 6–20)
CALCIUM ION: 0.94 mmol/L — AB (ref 1.15–1.40)
CALCIUM ION: 1.16 mmol/L (ref 1.15–1.40)
CALCIUM ION: 1.01 mmol/L — AB (ref 1.15–1.40)
CALCIUM ION: 1.02 mmol/L — AB (ref 1.15–1.40)
CHLORIDE: 96 mmol/L — AB (ref 101–111)
CHLORIDE: 98 mmol/L — AB (ref 101–111)
CREATININE: 1.2 mg/dL (ref 0.61–1.24)
CREATININE: 0.9 mg/dL (ref 0.61–1.24)
CREATININE: 1.1 mg/dL (ref 0.61–1.24)
CREATININE: 1 mg/dL (ref 0.61–1.24)
CREATININE: 1.2 mg/dL (ref 0.61–1.24)
CREATININE: 1.1 mg/dL (ref 0.61–1.24)
Calcium, Ion: 1.13 mmol/L — ABNORMAL LOW (ref 1.15–1.40)
Calcium, Ion: 1.02 mmol/L — ABNORMAL LOW (ref 1.15–1.40)
Calcium, Ion: 1.09 mmol/L — ABNORMAL LOW (ref 1.15–1.40)
Chloride: 97 mmol/L — ABNORMAL LOW (ref 101–111)
Chloride: 99 mmol/L — ABNORMAL LOW (ref 101–111)
Chloride: 98 mmol/L — ABNORMAL LOW (ref 101–111)
Chloride: 99 mmol/L — ABNORMAL LOW (ref 101–111)
Chloride: 103 mmol/L (ref 101–111)
Creatinine, Ser: 1 mg/dL (ref 0.61–1.24)
GLUCOSE: 106 mg/dL — AB (ref 65–99)
GLUCOSE: 99 mg/dL (ref 65–99)
GLUCOSE: 116 mg/dL — AB (ref 65–99)
GLUCOSE: 126 mg/dL — AB (ref 65–99)
GLUCOSE: 155 mg/dL — AB (ref 65–99)
Glucose, Bld: 112 mg/dL — ABNORMAL HIGH (ref 65–99)
Glucose, Bld: 122 mg/dL — ABNORMAL HIGH (ref 65–99)
HCT: 41 % (ref 39.0–52.0)
HCT: 33 % — ABNORMAL LOW (ref 39.0–52.0)
HCT: 41 % (ref 39.0–52.0)
HCT: 33 % — ABNORMAL LOW (ref 39.0–52.0)
HCT: 33 % — ABNORMAL LOW (ref 39.0–52.0)
HEMATOCRIT: 32 % — AB (ref 39.0–52.0)
HEMATOCRIT: 36 % — AB (ref 39.0–52.0)
HEMOGLOBIN: 13.9 g/dL (ref 13.0–17.0)
HEMOGLOBIN: 11.2 g/dL — AB (ref 13.0–17.0)
HEMOGLOBIN: 13.9 g/dL (ref 13.0–17.0)
HEMOGLOBIN: 12.2 g/dL — AB (ref 13.0–17.0)
Hemoglobin: 10.9 g/dL — ABNORMAL LOW (ref 13.0–17.0)
Hemoglobin: 11.2 g/dL — ABNORMAL LOW (ref 13.0–17.0)
Hemoglobin: 11.2 g/dL — ABNORMAL LOW (ref 13.0–17.0)
POTASSIUM: 4.1 mmol/L (ref 3.5–5.1)
POTASSIUM: 4.2 mmol/L (ref 3.5–5.1)
POTASSIUM: 4.7 mmol/L (ref 3.5–5.1)
POTASSIUM: 5 mmol/L (ref 3.5–5.1)
Potassium: 4 mmol/L (ref 3.5–5.1)
Potassium: 4 mmol/L (ref 3.5–5.1)
Potassium: 4 mmol/L (ref 3.5–5.1)
SODIUM: 139 mmol/L (ref 135–145)
Sodium: 140 mmol/L (ref 135–145)
Sodium: 139 mmol/L (ref 135–145)
Sodium: 140 mmol/L (ref 135–145)
Sodium: 136 mmol/L (ref 135–145)
Sodium: 137 mmol/L (ref 135–145)
Sodium: 137 mmol/L (ref 135–145)
TCO2: 30 mmol/L (ref 22–32)
TCO2: 32 mmol/L (ref 22–32)
TCO2: 32 mmol/L (ref 22–32)
TCO2: 30 mmol/L (ref 22–32)
TCO2: 31 mmol/L (ref 22–32)
TCO2: 28 mmol/L (ref 22–32)
TCO2: 24 mmol/L (ref 22–32)

## 2016-10-18 LAB — URINALYSIS, ROUTINE W REFLEX MICROSCOPIC
BILIRUBIN URINE: NEGATIVE
Glucose, UA: NEGATIVE mg/dL
HGB URINE DIPSTICK: NEGATIVE
KETONES UR: NEGATIVE mg/dL
Leukocytes, UA: NEGATIVE
Nitrite: NEGATIVE
PROTEIN: NEGATIVE mg/dL
Specific Gravity, Urine: 1.016 (ref 1.005–1.030)
pH: 5 (ref 5.0–8.0)

## 2016-10-18 LAB — POCT I-STAT 3, ART BLOOD GAS (G3+)
ACID-BASE EXCESS: 7 mmol/L — AB (ref 0.0–2.0)
ACID-BASE EXCESS: 8 mmol/L — AB (ref 0.0–2.0)
ACID-BASE EXCESS: 7 mmol/L — AB (ref 0.0–2.0)
ACID-BASE EXCESS: 1 mmol/L (ref 0.0–2.0)
Acid-base deficit: 1 mmol/L (ref 0.0–2.0)
BICARBONATE: 33.6 mmol/L — AB (ref 20.0–28.0)
Bicarbonate: 32.5 mmol/L — ABNORMAL HIGH (ref 20.0–28.0)
Bicarbonate: 30.7 mmol/L — ABNORMAL HIGH (ref 20.0–28.0)
Bicarbonate: 27.7 mmol/L (ref 20.0–28.0)
Bicarbonate: 25.5 mmol/L (ref 20.0–28.0)
Bicarbonate: 25.4 mmol/L (ref 20.0–28.0)
O2 SAT: 100 %
O2 SAT: 100 %
O2 SAT: 95 %
O2 Saturation: 100 %
O2 Saturation: 95 %
O2 Saturation: 93 %
PCO2 ART: 49.3 mmHg — AB (ref 32.0–48.0)
PCO2 ART: 48.3 mmHg — AB (ref 32.0–48.0)
PH ART: 7.467 — AB (ref 7.350–7.450)
PH ART: 7.332 — AB (ref 7.350–7.450)
PO2 ART: 82 mmHg — AB (ref 83.0–108.0)
Patient temperature: 36.5
Patient temperature: 37.7
TCO2: 35 mmol/L — AB (ref 22–32)
TCO2: 34 mmol/L — AB (ref 22–32)
TCO2: 32 mmol/L (ref 22–32)
TCO2: 29 mmol/L (ref 22–32)
TCO2: 27 mmol/L (ref 22–32)
TCO2: 27 mmol/L (ref 22–32)
pCO2 arterial: 52.5 mmHg — ABNORMAL HIGH (ref 32.0–48.0)
pCO2 arterial: 44.9 mmHg (ref 32.0–48.0)
pCO2 arterial: 39.5 mmHg (ref 32.0–48.0)
pCO2 arterial: 45.8 mmHg (ref 32.0–48.0)
pH, Arterial: 7.414 (ref 7.350–7.450)
pH, Arterial: 7.498 — ABNORMAL HIGH (ref 7.350–7.450)
pH, Arterial: 7.356 (ref 7.350–7.450)
pH, Arterial: 7.354 (ref 7.350–7.450)
pO2, Arterial: 464 mmHg — ABNORMAL HIGH (ref 83.0–108.0)
pO2, Arterial: 512 mmHg — ABNORMAL HIGH (ref 83.0–108.0)
pO2, Arterial: 259 mmHg — ABNORMAL HIGH (ref 83.0–108.0)
pO2, Arterial: 79 mmHg — ABNORMAL LOW (ref 83.0–108.0)
pO2, Arterial: 76 mmHg — ABNORMAL LOW (ref 83.0–108.0)

## 2016-10-18 LAB — PLATELET COUNT: PLATELETS: 129 10*3/uL — AB (ref 150–400)

## 2016-10-18 LAB — CBC
HCT: 50.2 % (ref 39.0–52.0)
HCT: 39.4 % (ref 39.0–52.0)
HEMATOCRIT: 41.2 % (ref 39.0–52.0)
Hemoglobin: 16.5 g/dL (ref 13.0–17.0)
Hemoglobin: 13.4 g/dL (ref 13.0–17.0)
Hemoglobin: 13.3 g/dL (ref 13.0–17.0)
MCH: 29.7 pg (ref 26.0–34.0)
MCH: 28.7 pg (ref 26.0–34.0)
MCH: 30 pg (ref 26.0–34.0)
MCHC: 32.9 g/dL (ref 30.0–36.0)
MCHC: 32.5 g/dL (ref 30.0–36.0)
MCHC: 33.8 g/dL (ref 30.0–36.0)
MCV: 90.5 fL (ref 78.0–100.0)
MCV: 88.2 fL (ref 78.0–100.0)
MCV: 88.9 fL (ref 78.0–100.0)
PLATELETS: 172 10*3/uL (ref 150–400)
PLATELETS: 127 10*3/uL — AB (ref 150–400)
PLATELETS: 106 10*3/uL — AB (ref 150–400)
RBC: 5.55 MIL/uL (ref 4.22–5.81)
RBC: 4.67 MIL/uL (ref 4.22–5.81)
RBC: 4.43 MIL/uL (ref 4.22–5.81)
RDW: 14.6 % (ref 11.5–15.5)
RDW: 14.4 % (ref 11.5–15.5)
RDW: 14.6 % (ref 11.5–15.5)
WBC: 8.2 10*3/uL (ref 4.0–10.5)
WBC: 13.6 10*3/uL — AB (ref 4.0–10.5)
WBC: 13.1 10*3/uL — AB (ref 4.0–10.5)

## 2016-10-18 LAB — POCT I-STAT 4, (NA,K, GLUC, HGB,HCT)
GLUCOSE: 121 mg/dL — AB (ref 65–99)
HEMATOCRIT: 38 % — AB (ref 39.0–52.0)
HEMOGLOBIN: 12.9 g/dL — AB (ref 13.0–17.0)
Potassium: 3.7 mmol/L (ref 3.5–5.1)
Sodium: 139 mmol/L (ref 135–145)

## 2016-10-18 LAB — ECHO TEE
AOPV: 0.57 m/s
AOVTI: 15.9 cm
AV Area VTI index: 0.79 cm2/m2
AV Area mean vel: 1.65 cm2
AV Mean grad: 1 mmHg
AV Peak grad: 3 mmHg
AV area mean vel ind: 0.8 cm2/m2
AV peak Index: 0.95
AV vel: 1.64
AVAREAVTI: 1.96 cm2
AVCELMEANRAT: 0.48
AVPKVEL: 80.3 cm/s
CHL CUP DOP CALC LVOT VTI: 7.55 cm
DOP CAL AO MEAN VELOCITY: 55.9 cm/s
FS: 7 % — AB (ref 28–44)
LDCA: 3.46 cm2
LVOT SV: 26 mL
LVOT diameter: 21 mm
LVOT peak VTI: 0.47 cm
LVOTPV: 45.4 cm/s
Valve area index: 0.79
Valve area: 1.64 cm2

## 2016-10-18 LAB — SURGICAL PCR SCREEN
MRSA, PCR: NEGATIVE
Staphylococcus aureus: NEGATIVE

## 2016-10-18 LAB — BASIC METABOLIC PANEL
ANION GAP: 9 (ref 5–15)
BUN: 36 mg/dL — AB (ref 6–20)
CALCIUM: 9.2 mg/dL (ref 8.9–10.3)
CO2: 32 mmol/L (ref 22–32)
Chloride: 98 mmol/L — ABNORMAL LOW (ref 101–111)
Creatinine, Ser: 1.49 mg/dL — ABNORMAL HIGH (ref 0.61–1.24)
GFR calc Af Amer: 51 mL/min — ABNORMAL LOW (ref >60)
GFR calc non Af Amer: 44 mL/min — ABNORMAL LOW (ref >60)
GLUCOSE: 93 mg/dL (ref 65–99)
POTASSIUM: 3.9 mmol/L (ref 3.5–5.1)
SODIUM: 139 mmol/L (ref 135–145)

## 2016-10-18 LAB — GLUCOSE, CAPILLARY
GLUCOSE-CAPILLARY: 115 mg/dL — AB (ref 65–99)
GLUCOSE-CAPILLARY: 108 mg/dL — AB (ref 65–99)
GLUCOSE-CAPILLARY: 105 mg/dL — AB (ref 65–99)
GLUCOSE-CAPILLARY: 134 mg/dL — AB (ref 65–99)
Glucose-Capillary: 113 mg/dL — ABNORMAL HIGH (ref 65–99)

## 2016-10-18 LAB — APTT: APTT: 30 s (ref 24–36)

## 2016-10-18 LAB — PROTIME-INR
INR: 1.43
Prothrombin Time: 17.4 seconds — ABNORMAL HIGH (ref 11.4–15.2)

## 2016-10-18 LAB — CREATININE, SERUM
CREATININE: 1.25 mg/dL — AB (ref 0.61–1.24)
GFR, EST NON AFRICAN AMERICAN: 55 mL/min — AB (ref >60)

## 2016-10-18 LAB — HEMOGLOBIN AND HEMATOCRIT, BLOOD
HCT: 35.6 % — ABNORMAL LOW (ref 39.0–52.0)
Hemoglobin: 11.8 g/dL — ABNORMAL LOW (ref 13.0–17.0)

## 2016-10-18 LAB — MAGNESIUM: Magnesium: 3.4 mg/dL — ABNORMAL HIGH (ref 1.7–2.4)

## 2016-10-18 SURGERY — CORONARY ARTERY BYPASS GRAFTING (CABG)
Anesthesia: General | Site: Chest

## 2016-10-18 MED ORDER — DOPAMINE-DEXTROSE 3.2-5 MG/ML-% IV SOLN
3.0000 ug/kg/min | INTRAVENOUS | Status: DC
Start: 2016-10-18 — End: 2016-10-20
  Administered 2016-10-18: 3 ug/kg/min via INTRAVENOUS

## 2016-10-18 MED ORDER — PROTAMINE SULFATE 10 MG/ML IV SOLN
INTRAVENOUS | Status: AC
  Filled 2016-10-18: qty 25

## 2016-10-18 MED ORDER — INSULIN ASPART 100 UNIT/ML ~~LOC~~ SOLN
0.0000 [IU] | SUBCUTANEOUS | Status: DC
  Administered 2016-10-18 (×2): 2 [IU] via SUBCUTANEOUS

## 2016-10-18 MED ORDER — SODIUM CHLORIDE 0.9 % IV SOLN
INTRAVENOUS | Status: DC
  Administered 2016-10-18: 1.1 [IU]/h via INTRAVENOUS
  Filled 2016-10-18: qty 1

## 2016-10-18 MED ORDER — NOREPINEPHRINE BITARTRATE 1 MG/ML IV SOLN
0.0000 ug/min | INTRAVENOUS | Status: DC
  Filled 2016-10-18: qty 4

## 2016-10-18 MED ORDER — PLASMA-LYTE 148 IV SOLN
INTRAVENOUS | Status: DC | PRN
  Administered 2016-10-18: 500 mL via INTRAVASCULAR

## 2016-10-18 MED ORDER — SODIUM CHLORIDE 0.9 % IV SOLN
250.0000 mL | INTRAVENOUS | Status: DC
  Administered 2016-10-19: 250 mL via INTRAVENOUS

## 2016-10-18 MED ORDER — OXYCODONE HCL 5 MG PO TABS
5.0000 mg | ORAL_TABLET | ORAL | Status: DC | PRN
  Administered 2016-10-19: 5 mg via ORAL
  Administered 2016-10-19 (×2): 10 mg via ORAL
  Filled 2016-10-18: qty 2
  Filled 2016-10-18: qty 1
  Filled 2016-10-18: qty 2
  Filled 2016-10-18: qty 1

## 2016-10-18 MED ORDER — SODIUM CHLORIDE 0.9% FLUSH: 3.0000 mL | INTRAVENOUS | Status: DC | PRN

## 2016-10-18 MED ORDER — BISACODYL 10 MG RE SUPP: 10.0000 mg | Freq: Every day | RECTAL | Status: DC

## 2016-10-18 MED ORDER — FENTANYL CITRATE (PF) 250 MCG/5ML IJ SOLN
INTRAMUSCULAR | Status: DC | PRN
  Administered 2016-10-18: 50 ug via INTRAVENOUS
  Administered 2016-10-18: 150 ug via INTRAVENOUS
  Administered 2016-10-18: 250 ug via INTRAVENOUS
  Administered 2016-10-18: 150 ug via INTRAVENOUS
  Administered 2016-10-18: 100 ug via INTRAVENOUS
  Administered 2016-10-18 (×2): 150 ug via INTRAVENOUS
  Administered 2016-10-18 (×2): 100 ug via INTRAVENOUS
  Administered 2016-10-18: 50 ug via INTRAVENOUS

## 2016-10-18 MED ORDER — ARTIFICIAL TEARS OPHTHALMIC OINT
TOPICAL_OINTMENT | OPHTHALMIC | Status: DC | PRN
  Administered 2016-10-18: 1 via OPHTHALMIC

## 2016-10-18 MED ORDER — SUCCINYLCHOLINE CHLORIDE 20 MG/ML IJ SOLN
INTRAMUSCULAR | Status: DC | PRN
  Administered 2016-10-18: 100 mg via INTRAVENOUS

## 2016-10-18 MED ORDER — MILRINONE LACTATE IN DEXTROSE 20-5 MG/100ML-% IV SOLN
0.2000 ug/kg/min | INTRAVENOUS | Status: DC
  Administered 2016-10-18 (×2): 0.3 ug/kg/min via INTRAVENOUS
  Administered 2016-10-19: 0.2 ug/kg/min via INTRAVENOUS
  Filled 2016-10-18 (×3): qty 100

## 2016-10-18 MED ORDER — SODIUM CHLORIDE 0.9 % IV SOLN
INTRAVENOUS | Status: DC | PRN
  Administered 2016-10-18: 13:00:00 via INTRAVENOUS

## 2016-10-18 MED ORDER — ACETAMINOPHEN 160 MG/5ML PO SOLN: 1000.0000 mg | Freq: Four times a day (QID) | ORAL | Status: DC

## 2016-10-18 MED ORDER — EPHEDRINE 5 MG/ML INJ
INTRAVENOUS | Status: AC
  Filled 2016-10-18: qty 10

## 2016-10-18 MED ORDER — LIDOCAINE 2% (20 MG/ML) 5 ML SYRINGE
INTRAMUSCULAR | Status: DC | PRN
  Administered 2016-10-18: 60 mg via INTRAVENOUS

## 2016-10-18 MED ORDER — MIDAZOLAM HCL 2 MG/2ML IJ SOLN
INTRAMUSCULAR | Status: AC
  Filled 2016-10-18: qty 2

## 2016-10-18 MED ORDER — ACETAMINOPHEN 500 MG PO TABS
1000.0000 mg | ORAL_TABLET | Freq: Four times a day (QID) | ORAL | Status: DC
  Administered 2016-10-18 – 2016-10-20 (×5): 1000 mg via ORAL
  Filled 2016-10-18 (×5): qty 2

## 2016-10-18 MED ORDER — LACTATED RINGERS IV SOLN
INTRAVENOUS | Status: DC
  Administered 2016-10-18: 14:00:00 via INTRAVENOUS

## 2016-10-18 MED ORDER — SODIUM CHLORIDE 0.9 % IV SOLN
0.0000 ug/kg/h | INTRAVENOUS | Status: DC
  Filled 2016-10-18: qty 2

## 2016-10-18 MED ORDER — MIDAZOLAM HCL 10 MG/2ML IJ SOLN
INTRAMUSCULAR | Status: AC
  Filled 2016-10-18: qty 2

## 2016-10-18 MED ORDER — ACETAMINOPHEN 160 MG/5ML PO SOLN: 650.0000 mg | Freq: Once | ORAL | Status: DC

## 2016-10-18 MED ORDER — METOPROLOL TARTRATE 5 MG/5ML IV SOLN: 2.5000 mg | INTRAVENOUS | Status: DC | PRN

## 2016-10-18 MED ORDER — SODIUM CHLORIDE 0.9 % IJ SOLN
OROMUCOSAL | Status: DC | PRN
  Administered 2016-10-18 (×3): 4 mL via TOPICAL

## 2016-10-18 MED ORDER — METOPROLOL TARTRATE 25 MG/10 ML ORAL SUSPENSION: 12.5000 mg | Freq: Two times a day (BID) | ORAL | Status: DC

## 2016-10-18 MED ORDER — CEFUROXIME SODIUM 1.5 G IV SOLR
1.5000 g | Freq: Two times a day (BID) | INTRAVENOUS | Status: AC
  Administered 2016-10-18 – 2016-10-20 (×4): 1.5 g via INTRAVENOUS
  Filled 2016-10-18 (×4): qty 1.5

## 2016-10-18 MED ORDER — FENTANYL CITRATE (PF) 250 MCG/5ML IJ SOLN
INTRAMUSCULAR | Status: AC
  Filled 2016-10-18: qty 25

## 2016-10-18 MED ORDER — METOCLOPRAMIDE HCL 5 MG/ML IJ SOLN
10.0000 mg | Freq: Four times a day (QID) | INTRAMUSCULAR | Status: AC
  Administered 2016-10-18 – 2016-10-19 (×3): 10 mg via INTRAVENOUS
  Filled 2016-10-18 (×3): qty 2

## 2016-10-18 MED ORDER — SODIUM CHLORIDE 0.9 % IV SOLN: INTRAVENOUS | Status: DC

## 2016-10-18 MED ORDER — SODIUM CHLORIDE 0.45 % IV SOLN
INTRAVENOUS | Status: DC | PRN
  Administered 2016-10-18: 14:00:00 via INTRAVENOUS

## 2016-10-18 MED ORDER — ASPIRIN EC 325 MG PO TBEC
325.0000 mg | DELAYED_RELEASE_TABLET | Freq: Every day | ORAL | Status: DC
  Administered 2016-10-19 – 2016-10-20 (×2): 325 mg via ORAL
  Filled 2016-10-18 (×2): qty 1

## 2016-10-18 MED ORDER — PROTAMINE SULFATE 10 MG/ML IV SOLN
INTRAVENOUS | Status: DC | PRN
  Administered 2016-10-18: 230 mg via INTRAVENOUS

## 2016-10-18 MED ORDER — MIDAZOLAM HCL 5 MG/5ML IJ SOLN
INTRAMUSCULAR | Status: DC | PRN
  Administered 2016-10-18 (×2): 2 mg via INTRAVENOUS
  Administered 2016-10-18: 1 mg via INTRAVENOUS
  Administered 2016-10-18: 2 mg via INTRAVENOUS
  Administered 2016-10-18: 1 mg via INTRAVENOUS
  Administered 2016-10-18: 4 mg via INTRAVENOUS
  Administered 2016-10-18: 2 mg via INTRAVENOUS

## 2016-10-18 MED ORDER — INSULIN REGULAR BOLUS VIA INFUSION
0.0000 [IU] | Freq: Three times a day (TID) | INTRAVENOUS | Status: DC
  Filled 2016-10-18: qty 10

## 2016-10-18 MED ORDER — MIDAZOLAM HCL 2 MG/2ML IJ SOLN: 2.0000 mg | INTRAMUSCULAR | Status: DC | PRN

## 2016-10-18 MED ORDER — MORPHINE SULFATE (PF) 4 MG/ML IV SOLN
1.0000 mg | INTRAVENOUS | Status: AC | PRN
  Filled 2016-10-18: qty 1

## 2016-10-18 MED ORDER — LACTATED RINGERS IV SOLN: INTRAVENOUS | Status: DC

## 2016-10-18 MED ORDER — SODIUM CHLORIDE 0.9 % IV SOLN
0.4000 ug/kg/h | INTRAVENOUS | Status: DC
  Filled 2016-10-18: qty 4

## 2016-10-18 MED ORDER — ONDANSETRON HCL 4 MG/2ML IJ SOLN
4.0000 mg | Freq: Four times a day (QID) | INTRAMUSCULAR | Status: DC | PRN
  Filled 2016-10-18: qty 2

## 2016-10-18 MED ORDER — HEPARIN SODIUM (PORCINE) 1000 UNIT/ML IJ SOLN
INTRAMUSCULAR | Status: DC | PRN
  Administered 2016-10-18: 25000 [IU] via INTRAVENOUS

## 2016-10-18 MED ORDER — PROPOFOL 10 MG/ML IV BOLUS
INTRAVENOUS | Status: AC
  Filled 2016-10-18: qty 20

## 2016-10-18 MED ORDER — CHLORHEXIDINE GLUCONATE 0.12 % MT SOLN
15.0000 mL | OROMUCOSAL | Status: AC
  Administered 2016-10-18: 15 mL via OROMUCOSAL

## 2016-10-18 MED ORDER — CHLORHEXIDINE GLUCONATE 0.12% ORAL RINSE (MEDLINE KIT)
15.0000 mL | Freq: Two times a day (BID) | OROMUCOSAL | Status: DC
  Administered 2016-10-18 – 2016-10-20 (×4): 15 mL via OROMUCOSAL

## 2016-10-18 MED ORDER — ACETAMINOPHEN 650 MG RE SUPP: 650.0000 mg | Freq: Once | RECTAL | Status: DC

## 2016-10-18 MED ORDER — ALBUMIN HUMAN 5 % IV SOLN
250.0000 mL | INTRAVENOUS | Status: AC | PRN
  Administered 2016-10-18 – 2016-10-19 (×4): 250 mL via INTRAVENOUS
  Filled 2016-10-18 (×2): qty 250

## 2016-10-18 MED ORDER — HEPARIN SODIUM (PORCINE) 1000 UNIT/ML IJ SOLN
INTRAMUSCULAR | Status: AC
  Filled 2016-10-18: qty 1

## 2016-10-18 MED ORDER — METOPROLOL TARTRATE 12.5 MG HALF TABLET: 12.5000 mg | ORAL_TABLET | Freq: Two times a day (BID) | ORAL | Status: DC

## 2016-10-18 MED ORDER — LACTATED RINGERS IV SOLN
INTRAVENOUS | Status: DC | PRN
  Administered 2016-10-18 (×2): via INTRAVENOUS

## 2016-10-18 MED ORDER — SODIUM CHLORIDE 0.9% FLUSH
3.0000 mL | Freq: Two times a day (BID) | INTRAVENOUS | Status: DC
  Administered 2016-10-19 – 2016-10-20 (×4): 3 mL via INTRAVENOUS

## 2016-10-18 MED ORDER — THROMBIN 20000 UNITS EX SOLR
CUTANEOUS | Status: AC
  Filled 2016-10-18: qty 20000

## 2016-10-18 MED ORDER — THROMBIN 20000 UNITS EX SOLR
CUTANEOUS | Status: DC | PRN
  Administered 2016-10-18: 20000 [IU] via TOPICAL

## 2016-10-18 MED ORDER — POTASSIUM CHLORIDE 10 MEQ/50ML IV SOLN
10.0000 meq | INTRAVENOUS | Status: AC
  Administered 2016-10-18 (×3): 10 meq via INTRAVENOUS

## 2016-10-18 MED ORDER — LIDOCAINE 2% (20 MG/ML) 5 ML SYRINGE
INTRAMUSCULAR | Status: AC
  Filled 2016-10-18: qty 5

## 2016-10-18 MED ORDER — EPHEDRINE SULFATE 50 MG/ML IJ SOLN
INTRAMUSCULAR | Status: DC | PRN
Start: 2016-10-18 — End: 2016-10-18
  Administered 2016-10-18 (×4): 5 mg via INTRAVENOUS

## 2016-10-18 MED ORDER — MORPHINE SULFATE (PF) 4 MG/ML IV SOLN
2.0000 mg | INTRAVENOUS | Status: DC | PRN
  Administered 2016-10-18 (×2): 4 mg via INTRAVENOUS
  Filled 2016-10-18 (×2): qty 1

## 2016-10-18 MED ORDER — ORAL CARE MOUTH RINSE
15.0000 mL | Freq: Four times a day (QID) | OROMUCOSAL | Status: DC
  Administered 2016-10-18 – 2016-10-19 (×4): 15 mL via OROMUCOSAL

## 2016-10-18 MED ORDER — LACTATED RINGERS IV SOLN: 500.0000 mL | Freq: Once | INTRAVENOUS | Status: DC | PRN

## 2016-10-18 MED ORDER — 0.9 % SODIUM CHLORIDE (POUR BTL) OPTIME
TOPICAL | Status: DC | PRN
  Administered 2016-10-18: 6000 mL

## 2016-10-18 MED ORDER — VANCOMYCIN HCL IN DEXTROSE 1-5 GM/200ML-% IV SOLN
1000.0000 mg | Freq: Once | INTRAVENOUS | Status: AC
  Administered 2016-10-18: 1000 mg via INTRAVENOUS
  Filled 2016-10-18: qty 200

## 2016-10-18 MED ORDER — BISACODYL 5 MG PO TBEC
10.0000 mg | DELAYED_RELEASE_TABLET | Freq: Every day | ORAL | Status: DC
  Administered 2016-10-19: 10 mg via ORAL

## 2016-10-18 MED ORDER — ROCURONIUM BROMIDE 10 MG/ML (PF) SYRINGE
PREFILLED_SYRINGE | INTRAVENOUS | Status: DC | PRN
  Administered 2016-10-18 (×2): 100 mg via INTRAVENOUS

## 2016-10-18 MED ORDER — PANTOPRAZOLE SODIUM 40 MG PO TBEC
40.0000 mg | DELAYED_RELEASE_TABLET | Freq: Every day | ORAL | Status: DC
  Administered 2016-10-20: 40 mg via ORAL
  Filled 2016-10-18: qty 1

## 2016-10-18 MED ORDER — PHENYLEPHRINE 40 MCG/ML (10ML) SYRINGE FOR IV PUSH (FOR BLOOD PRESSURE SUPPORT)
PREFILLED_SYRINGE | INTRAVENOUS | Status: AC
  Filled 2016-10-18: qty 10

## 2016-10-18 MED ORDER — ASPIRIN 81 MG PO CHEW: 324.0000 mg | CHEWABLE_TABLET | Freq: Every day | ORAL | Status: DC

## 2016-10-18 MED ORDER — FAMOTIDINE IN NACL 20-0.9 MG/50ML-% IV SOLN
20.0000 mg | Freq: Two times a day (BID) | INTRAVENOUS | Status: DC
  Administered 2016-10-18: 20 mg via INTRAVENOUS

## 2016-10-18 MED ORDER — MILRINONE LACTATE IN DEXTROSE 20-5 MG/100ML-% IV SOLN
0.1250 ug/kg/min | INTRAVENOUS | Status: AC
  Administered 2016-10-18: .3 ug/kg/min via INTRAVENOUS
  Filled 2016-10-18: qty 100

## 2016-10-18 MED ORDER — PHENYLEPHRINE HCL 10 MG/ML IJ SOLN
0.0000 ug/min | INTRAMUSCULAR | Status: DC
  Administered 2016-10-18: 25 ug/min via INTRAVENOUS
  Filled 2016-10-18 (×2): qty 2

## 2016-10-18 MED ORDER — PHENYLEPHRINE HCL 10 MG/ML IJ SOLN
INTRAMUSCULAR | Status: DC | PRN
  Administered 2016-10-18: 80 ug via INTRAVENOUS

## 2016-10-18 MED ORDER — MAGNESIUM SULFATE 4 GM/100ML IV SOLN
4.0000 g | Freq: Once | INTRAVENOUS | Status: AC
  Administered 2016-10-18: 4 g via INTRAVENOUS
  Filled 2016-10-18: qty 100

## 2016-10-18 MED ORDER — HEMOSTATIC AGENTS (NO CHARGE) OPTIME
TOPICAL | Status: DC | PRN
  Administered 2016-10-18: 1 via TOPICAL

## 2016-10-18 MED ORDER — DOCUSATE SODIUM 100 MG PO CAPS
200.0000 mg | ORAL_CAPSULE | Freq: Every day | ORAL | Status: DC
  Administered 2016-10-19: 200 mg via ORAL

## 2016-10-18 MED ORDER — SUCCINYLCHOLINE CHLORIDE 200 MG/10ML IV SOSY
PREFILLED_SYRINGE | INTRAVENOUS | Status: AC
  Filled 2016-10-18: qty 10

## 2016-10-18 MED ORDER — PROPOFOL 10 MG/ML IV BOLUS
INTRAVENOUS | Status: DC | PRN
  Administered 2016-10-18: 70 mg via INTRAVENOUS

## 2016-10-18 MED ORDER — NITROGLYCERIN IN D5W 200-5 MCG/ML-% IV SOLN
0.0000 ug/min | INTRAVENOUS | Status: DC
Start: 2016-10-18 — End: 2016-10-20

## 2016-10-18 MED ORDER — TRAMADOL HCL 50 MG PO TABS: 50.0000 mg | ORAL_TABLET | ORAL | Status: DC | PRN

## 2016-10-18 MED ORDER — ROCURONIUM BROMIDE 10 MG/ML (PF) SYRINGE
PREFILLED_SYRINGE | INTRAVENOUS | Status: AC
  Filled 2016-10-18: qty 10

## 2016-10-18 MED FILL — Potassium Chloride Inj 2 mEq/ML: INTRAVENOUS | Qty: 40 | Status: AC

## 2016-10-18 MED FILL — Magnesium Sulfate Inj 50%: INTRAMUSCULAR | Qty: 10 | Status: AC

## 2016-10-18 MED FILL — Mannitol IV Soln 20%: INTRAVENOUS | Qty: 500 | Status: AC

## 2016-10-18 MED FILL — Lidocaine HCl IV Inj 20 MG/ML: INTRAVENOUS | Qty: 10 | Status: AC

## 2016-10-18 MED FILL — Sodium Chloride IV Soln 0.9%: INTRAVENOUS | Qty: 2000 | Status: AC

## 2016-10-18 MED FILL — Heparin Sodium (Porcine) Inj 1000 Unit/ML: INTRAMUSCULAR | Qty: 30 | Status: AC

## 2016-10-18 MED FILL — Sodium Bicarbonate IV Soln 8.4%: INTRAVENOUS | Qty: 50 | Status: AC

## 2016-10-18 MED FILL — Electrolyte-R (PH 7.4) Solution: INTRAVENOUS | Qty: 3000 | Status: AC

## 2016-10-18 MED FILL — Heparin Sodium (Porcine) Inj 1000 Unit/ML: INTRAMUSCULAR | Qty: 10 | Status: AC

## 2016-10-18 SURGICAL SUPPLY — 114 items
BAG DECANTER FOR FLEXI CONT (MISCELLANEOUS) ×4 IMPLANT
BANDAGE ACE 4X5 VEL STRL LF (GAUZE/BANDAGES/DRESSINGS) ×4 IMPLANT
BANDAGE ACE 6X5 VEL STRL LF (GAUZE/BANDAGES/DRESSINGS) ×4 IMPLANT
BASKET HEART  (ORDER IN 25'S) (MISCELLANEOUS) ×1
BASKET HEART (ORDER IN 25'S) (MISCELLANEOUS) ×1
BASKET HEART (ORDER IN 25S) (MISCELLANEOUS) ×2 IMPLANT
BENZOIN TINCTURE PRP APPL 2/3 (GAUZE/BANDAGES/DRESSINGS) ×4 IMPLANT
BLADE STERNUM SYSTEM 6 (BLADE) ×4 IMPLANT
BLADE SURG 11 STRL SS (BLADE) ×4 IMPLANT
BLADE SURG ROTATE 9660 (MISCELLANEOUS) ×4 IMPLANT
BNDG GAUZE ELAST 4 BULKY (GAUZE/BANDAGES/DRESSINGS) ×4 IMPLANT
CANISTER SUCT 3000ML PPV (MISCELLANEOUS) ×4 IMPLANT
CATH ROBINSON RED A/P 18FR (CATHETERS) ×8 IMPLANT
CATH THORACIC 28FR (CATHETERS) ×4 IMPLANT
CATH THORACIC 36FR (CATHETERS) ×4 IMPLANT
CATH THORACIC 36FR RT ANG (CATHETERS) ×4 IMPLANT
CHLORAPREP W/TINT 10.5 ML (MISCELLANEOUS) ×4 IMPLANT
CLIP VESOCCLUDE MED 24/CT (CLIP) IMPLANT
CLIP VESOCCLUDE SM WIDE 24/CT (CLIP) ×4 IMPLANT
CRADLE DONUT ADULT HEAD (MISCELLANEOUS) ×4 IMPLANT
DRAPE CARDIOVASCULAR INCISE (DRAPES) ×2
DRAPE SLUSH/WARMER DISC (DRAPES) ×4 IMPLANT
DRAPE SRG 135X102X78XABS (DRAPES) ×2 IMPLANT
DRSG COVADERM 4X14 (GAUZE/BANDAGES/DRESSINGS) ×4 IMPLANT
ELECT CAUTERY BLADE 6.4 (BLADE) ×4 IMPLANT
ELECT REM PT RETURN 9FT ADLT (ELECTROSURGICAL) ×8
ELECTRODE REM PT RTRN 9FT ADLT (ELECTROSURGICAL) ×4 IMPLANT
FELT TEFLON 1X6 (MISCELLANEOUS) ×8 IMPLANT
GAUZE SPONGE 4X4 12PLY STRL (GAUZE/BANDAGES/DRESSINGS) ×4 IMPLANT
GAUZE SPONGE 4X4 12PLY STRL LF (GAUZE/BANDAGES/DRESSINGS) ×4 IMPLANT
GLOVE BIO SURGEON STRL SZ 6 (GLOVE) ×12 IMPLANT
GLOVE BIO SURGEON STRL SZ 6.5 (GLOVE) ×12 IMPLANT
GLOVE BIO SURGEON STRL SZ7 (GLOVE) ×8 IMPLANT
GLOVE BIO SURGEON STRL SZ7.5 (GLOVE) IMPLANT
GLOVE BIO SURGEONS STRL SZ 6.5 (GLOVE) ×4
GLOVE BIOGEL PI IND STRL 6 (GLOVE) IMPLANT
GLOVE BIOGEL PI IND STRL 6.5 (GLOVE) IMPLANT
GLOVE BIOGEL PI IND STRL 7.0 (GLOVE) IMPLANT
GLOVE BIOGEL PI INDICATOR 6 (GLOVE)
GLOVE BIOGEL PI INDICATOR 6.5 (GLOVE)
GLOVE BIOGEL PI INDICATOR 7.0 (GLOVE)
GLOVE EUDERMIC 7 POWDERFREE (GLOVE) ×8 IMPLANT
GLOVE ORTHO TXT STRL SZ7.5 (GLOVE) IMPLANT
GOWN STRL REUS W/ TWL LRG LVL3 (GOWN DISPOSABLE) ×20 IMPLANT
GOWN STRL REUS W/ TWL XL LVL3 (GOWN DISPOSABLE) ×2 IMPLANT
GOWN STRL REUS W/TWL LRG LVL3 (GOWN DISPOSABLE) ×20
GOWN STRL REUS W/TWL XL LVL3 (GOWN DISPOSABLE) ×2
HEMOSTAT POWDER SURGIFOAM 1G (HEMOSTASIS) ×12 IMPLANT
HEMOSTAT SURGICEL 2X14 (HEMOSTASIS) ×4 IMPLANT
INSERT FOGARTY 61MM (MISCELLANEOUS) IMPLANT
INSERT FOGARTY XLG (MISCELLANEOUS) IMPLANT
KIT BASIN OR (CUSTOM PROCEDURE TRAY) ×4 IMPLANT
KIT CATH CPB BARTLE (MISCELLANEOUS) ×4 IMPLANT
KIT ROOM TURNOVER OR (KITS) ×4 IMPLANT
KIT SUCTION CATH 14FR (SUCTIONS) ×4 IMPLANT
KIT VASOVIEW HEMOPRO VH 3000 (KITS) ×4 IMPLANT
NS IRRIG 1000ML POUR BTL (IV SOLUTION) ×24 IMPLANT
PACK OPEN HEART (CUSTOM PROCEDURE TRAY) ×4 IMPLANT
PAD ARMBOARD 7.5X6 YLW CONV (MISCELLANEOUS) ×8 IMPLANT
PAD ELECT DEFIB RADIOL ZOLL (MISCELLANEOUS) ×4 IMPLANT
PENCIL BUTTON HOLSTER BLD 10FT (ELECTRODE) ×4 IMPLANT
PUNCH AORTIC ROTATE 4.0MM (MISCELLANEOUS) IMPLANT
PUNCH AORTIC ROTATE 4.5MM 8IN (MISCELLANEOUS) ×4 IMPLANT
PUNCH AORTIC ROTATE 5MM 8IN (MISCELLANEOUS) IMPLANT
SET CARDIOPLEGIA MPS 5001102 (MISCELLANEOUS) ×4 IMPLANT
SOLUTION ANTI FOG 6CC (MISCELLANEOUS) ×4 IMPLANT
SPONGE INTESTINAL PEANUT (DISPOSABLE) IMPLANT
SPONGE LAP 18X18 X RAY DECT (DISPOSABLE) ×4 IMPLANT
SPONGE LAP 4X18 X RAY DECT (DISPOSABLE) ×4 IMPLANT
SUT BONE WAX W31G (SUTURE) ×4 IMPLANT
SUT ETHIBOND 2 0 SH (SUTURE) ×8
SUT ETHIBOND 2 0 SH 36X2 (SUTURE) ×8 IMPLANT
SUT MNCRL AB 4-0 PS2 18 (SUTURE) ×4 IMPLANT
SUT PROLENE 3 0 SH DA (SUTURE) ×4 IMPLANT
SUT PROLENE 3 0 SH1 36 (SUTURE) ×4 IMPLANT
SUT PROLENE 4 0 RB 1 (SUTURE) ×2
SUT PROLENE 4 0 SH DA (SUTURE) IMPLANT
SUT PROLENE 4-0 RB1 .5 CRCL 36 (SUTURE) ×2 IMPLANT
SUT PROLENE 5 0 C 1 36 (SUTURE) ×8 IMPLANT
SUT PROLENE 6 0 C 1 30 (SUTURE) ×20 IMPLANT
SUT PROLENE 7 0 BV 1 (SUTURE) ×4 IMPLANT
SUT PROLENE 7 0 BV1 MDA (SUTURE) ×4 IMPLANT
SUT PROLENE 8 0 BV175 6 (SUTURE) ×8 IMPLANT
SUT SILK  1 MH (SUTURE) ×6
SUT SILK 1 MH (SUTURE) ×6 IMPLANT
SUT SILK 1 TIES 10X30 (SUTURE) ×4 IMPLANT
SUT SILK 2 0 SH CR/8 (SUTURE) ×8 IMPLANT
SUT SILK 2 0 TIES 10X30 (SUTURE) ×8 IMPLANT
SUT SILK 3 0 SH CR/8 (SUTURE) ×4 IMPLANT
SUT SILK 4 0 TIE 10X30 (SUTURE) ×8 IMPLANT
SUT STEEL STERNAL CCS#1 18IN (SUTURE) IMPLANT
SUT STEEL SZ 6 DBL 3X14 BALL (SUTURE) IMPLANT
SUT TEM PAC WIRE 2 0 SH (SUTURE) ×20 IMPLANT
SUT VIC AB 1 CTX 36 (SUTURE) ×6
SUT VIC AB 1 CTX36XBRD ANBCTR (SUTURE) ×6 IMPLANT
SUT VIC AB 2-0 CT1 27 (SUTURE)
SUT VIC AB 2-0 CT1 TAPERPNT 27 (SUTURE) IMPLANT
SUT VIC AB 2-0 CTX 27 (SUTURE) ×8 IMPLANT
SUT VIC AB 3-0 SH 27 (SUTURE)
SUT VIC AB 3-0 SH 27X BRD (SUTURE) IMPLANT
SUT VIC AB 3-0 X1 27 (SUTURE) ×8 IMPLANT
SUT VICRYL 4-0 PS2 18IN ABS (SUTURE) IMPLANT
SUTURE E-PAK OPEN HEART (SUTURE) IMPLANT
SYSTEM SAHARA CHEST DRAIN ATS (WOUND CARE) ×4 IMPLANT
TAPE CLOTH SURG 4X10 WHT LF (GAUZE/BANDAGES/DRESSINGS) ×4 IMPLANT
TAPE PAPER 2X10 WHT MICROPORE (GAUZE/BANDAGES/DRESSINGS) ×4 IMPLANT
TOWEL GREEN STERILE (TOWEL DISPOSABLE) ×16 IMPLANT
TOWEL GREEN STERILE FF (TOWEL DISPOSABLE) ×8 IMPLANT
TOWEL OR 17X24 6PK STRL BLUE (TOWEL DISPOSABLE) ×4 IMPLANT
TOWEL OR 17X26 10 PK STRL BLUE (TOWEL DISPOSABLE) ×4 IMPLANT
TRAY FOLEY SILVER 16FR TEMP (SET/KITS/TRAYS/PACK) ×4 IMPLANT
TUBING INSUFFLATION (TUBING) ×4 IMPLANT
UNDERPAD 30X30 (UNDERPADS AND DIAPERS) ×4 IMPLANT
WATER STERILE IRR 1000ML POUR (IV SOLUTION) ×8 IMPLANT

## 2016-10-18 NOTE — Progress Notes (Signed)
Rapid wean protocol started.

## 2016-10-18 NOTE — Anesthesia Procedure Notes (Signed)
Arterial Line Insertion Start/End8/30/2018 6:50 AM, 10/18/2016 7:00 AM Performed by: Myrtie Soman, Syd Manges B, CRNA  Patient location: Pre-op. Preanesthetic checklist: patient identified, risks and benefits discussed, monitors and equipment checked and timeout performed Lidocaine 1% used for infiltration and patient sedated Left, radial was placed Catheter size: 20 G Hand hygiene performed  and maximum sterile barriers used  Allen's test indicative of satisfactory collateral circulation Attempts: 1 Procedure performed without using ultrasound guided technique. Following insertion, dressing applied and Biopatch. Post procedure assessment: unchanged  Patient tolerated the procedure well with no immediate complications.

## 2016-10-18 NOTE — Op Note (Signed)
CARDIOVASCULAR SURGERY OPERATIVE NOTE  10/18/2016  Surgeon:  Gaye Pollack, MD  First Assistant: Ellwood Handler,  PA-C   Preoperative Diagnosis:  Severe left main and multi-vessel coronary artery disease   Postoperative Diagnosis:  Same   Procedure:  1. Median Sternotomy 2. Extracorporeal circulation 3.   Coronary artery bypass grafting x 5   Left internal mammary graft to the LAD  SVG to Diagonal 2  SVG to OM1  Sequential SVG to OM2 and OM3   4.   Endoscopic vein harvest from the right leg   Anesthesia:  General Endotracheal   Clinical History/Surgical Indication:  The patient is a 76 year old gentleman who recently moved here from [place] with and wife and has no significant medical history other than back surgery. He was followed by a cardiologist in [place] and had a stress test last year that was normal by his report. He has been busy this summer working on his house and did not have any problems until two weeks prior to admission when he began having shortness of breath with exertion. He was seen at an urgent care and given steroids and an inhaler that helped a little. He denies any chest pain or pressure. On the morning of 8/27 he woke up in the morning unable to breath and came to [facility] ER. CXR showed pulmonary edema and CTA of the chest ruled out PE but showed some pulmonary edema. He was treated with lasix and improved. An echo showed an EF of 30-35% with diffuse hypokinesis and akinesis of the basal inferior and inferoseptal walls with no significant valvular abnormality. He was transferred to [facility] and cath yesterday showed 75% ostial and 70% distal LM, 99% mid LAD and 90% distal LAD, an occluded RCA, 75% OM1 and 60% mid LCX. PA pressures were normal with LVEDP of 15.   He has high grade and left main and severe 3-vessel coronary artery disease with moderate to severe LV systolic  dysfunction and grade 2 diastolic dysfunction. He presented with recent onset of progressive shortness of breath due to acute on chronic combined systolic and diastolic heart failure. He has not had any chest pain or pressure. I agree that CABG is the best treatment for preventing further loss of myocardium and hopefully it will improve his EF. I reviewed the echo and cath findings with him and his wife and answered their questions.  I discussed the operative procedure with the patient and his wife including alternatives, benefits and risks; including but not limited to bleeding, blood transfusion, infection, stroke, myocardial infarction, graft failure, heart block requiring a permanent pacemaker, organ dysfunction, and death.  [name] understands and agrees to proceed.   Preparation:  The patient was seen in the preoperative holding area and the correct patient, correct operation were confirmed with the patient after reviewing the medical record and catheterization. The consent was signed by me. Preoperative antibiotics were given. A pulmonary arterial line and radial arterial line were placed by the anesthesia team. The patient was taken back to the operating room and positioned supine on the operating room table. After being placed under general endotracheal anesthesia by the anesthesia team a foley catheter was placed. The neck, chest, abdomen, and both legs were prepped with betadine soap and solution and draped in the usual sterile manner. A surgical time-out was taken and the correct patient and operative procedure were confirmed with the nursing and anesthesia staff.  Pre-bypass TEE: performed by Dr. Myrtie Soman. This showed an  EF of 25% with inferior akinesis, otherwise global hypokinesis. Mild MR. Normal RV function.  Cardiopulmonary Bypass:  A median sternotomy was performed. The pericardium was opened in the midline. Right ventricular function appeared normal. The ascending aorta was of  normal size and had no palpable plaque. There were no contraindications to aortic cannulation or cross-clamping. The patient was fully systemically heparinized and the ACT was maintained > 400 sec. The proximal aortic arch was cannulated with a 20 F aortic cannula for arterial inflow. Venous cannulation was performed via the right atrial appendage using a two-staged venous cannula. An antegrade cardioplegia/vent cannula was inserted into the mid-ascending aorta. Aortic occlusion was performed with a single cross-clamp. Systemic cooling to 32 degrees Centigrade and topical cooling of the heart with iced saline were used. Hyperkalemic antegrade cold blood cardioplegia was used to induce diastolic arrest and was then given at about 20 minute intervals throughout the period of arrest to maintain myocardial temperature at or below 10 degrees centigrade. A temperature probe was inserted into the interventricular septum and an insulating pad was placed in the pericardium.   Left internal mammary harvest:  The left side of the sternum was retracted using the Rultract retractor. The left internal mammary artery was harvested as a pedicle graft. All side branches were clipped. It was a medium-sized vessel of good quality with excellent blood flow. It was ligated distally and divided. It was sprayed with topical papaverine solution to prevent vasospasm.   Endoscopic vein harvest:  The right greater saphenous vein was harvested endoscopically through a 2 cm incision medial to the right knee. It was harvested from the upper thigh to below the knee. It was a medium to large-sized vein of good quality. The side branches were all ligated with 4-0 silk ties.    Coronary arteries:  The coronary arteries were examined.   LAD:  Diffusely diseased. The D1 was small. D2 was a medium caliber vessel with no distal disease.  LCX:  OM1 was a medium caliber vessel with no distal disease. The OM2 and OM3 were large vessels  with no distal disease.  RCA:  Diffusely diseased. The PDA branch was small with no visible lumen. The inferior wall was all scar from the base to the apex.   Grafts:  1. LIMA to the LAD: 1.75 mm. It was sewn end to side using 8-0 prolene continuous suture. 2. SVG to D2:  1.6 mm. It was sewn end to side using 7-0 prolene continuous suture. 3. SVG to OM1:  1.6 mm. It was sewn end to side using 7-0 prolene continuous suture. 4. Sequential SVG to OM2:  1.75 mm. It was sewn sequential side to side using 7-0 prolene continuous suture. 5.   Sequential SVG to OM3:  1.75 mm. It was sewn sequential end to side using 7-0 prolene continuous suture.  The proximal vein graft anastomoses were performed to the mid-ascending aorta using continuous 6-0 prolene suture. Graft markers were placed around the proximal anastomoses.   Completion:  The patient was rewarmed to 37 degrees Centigrade. The clamp was removed from the LIMA pedicle and there was rapid warming of the septum and return of ventricular fibrillation. The crossclamp was removed with a time of 116 minutes. There was spontaneous return of sinus rhythm. The distal and proximal anastomoses were checked for hemostasis. The position of the grafts was satisfactory. Two temporary epicardial pacing wires were placed on the right atrium and two on the right ventricle. The patient was weaned  from CPB without difficulty on dopamine 5 mcg, milrinone 3 mcg. CPB time was 146 minutes. Cardiac output was 4.8 LPM. TEE showed improved LV function anteriorly with an EF of 35%. Heparin was fully reversed with protamine and the aortic and venous cannulas removed. Hemostasis was achieved. Mediastinal and left pleural drainage tubes were placed. The sternum was closed with double #6 stainless steel wires. The fascia was closed with continuous # 1 vicryl suture. The subcutaneous tissue was closed with 2-0 vicryl continuous suture. The skin was closed with 3-0 vicryl  subcuticular suture. All sponge, needle, and instrument counts were reported correct at the end of the case. Dry sterile dressings were placed over the incisions and around the chest tubes which were connected to pleurevac suction. The patient was then transported to the surgical intensive care unit in critical but stable condition.

## 2016-10-18 NOTE — Brief Op Note (Signed)
10/15/2016 - 10/18/2016  11:36 AM  PATIENT:  Jason Day  76 y.o. male  PRE-OPERATIVE DIAGNOSIS:  CAD  POST-OPERATIVE DIAGNOSIS:  CAD  PROCEDURE:  Procedure(s):  CORONARY ARTERY BYPASS GRAFTING x 5 -LIMA to LAD -SVG to DIAGONAL 2 -SVG to OM1 - SEQ SVG to OM 2 and OM 3  ENDOSCOPIC HARVEST GREATER SAPHENOUS VEIN -Right Leg  TRANSESOPHAGEAL ECHOCARDIOGRAM (TEE) (N/A)  SURGEON:  Surgeon(s) and Role:    * Bartle, Fernande Boyden, MD - Primary  PHYSICIAN ASSISTANT: Malaya Cagley PA-C  ANESTHESIA:   general  EBL:  Total I/O In: 1000 [I.V.:1000] Out: 550 [Urine:550]  BLOOD ADMINISTERED: CELLSAVER  DRAINS: Left Pleural Chest Tube, Mediastinal Chest Drain   LOCAL MEDICATIONS USED:  NONE  SPECIMEN:  No Specimen  DISPOSITION OF SPECIMEN:  N/A  COUNTS:  YES  TOURNIQUET:  * No tourniquets in log *  DICTATION: .Dragon Dictation  PLAN OF CARE: Admit to inpatient   PATIENT DISPOSITION:  ICU - intubated and hemodynamically stable.   Delay start of Pharmacological VTE agent (>24hrs) due to surgical blood loss or risk of bleeding: yes

## 2016-10-18 NOTE — Progress Notes (Signed)
  Echocardiogram Echocardiogram Transesophageal has been performed.  Jason Day 10/18/2016, 9:20 AM

## 2016-10-18 NOTE — Progress Notes (Signed)
Pt extubated per rapid wean protocol. NIF -30, VC 1.6L. No stridor or distress noted at this time. Pt placed on 4L [place]. Pt able to vocalize well. RN working with patient with IS. VS stable. Will cont to monitor

## 2016-10-18 NOTE — OR Nursing (Signed)
12:35 - 45 minute call to SICU charge nurse 13:10 - 20 minute call to SICU charge nurse

## 2016-10-18 NOTE — Anesthesia Preprocedure Evaluation (Signed)
Anesthesia Evaluation  Patient identified by MRN, date of birth, ID band Patient awake    Reviewed: Allergy & Precautions, NPO status , Patient's Chart, lab work & pertinent test results  Airway Mallampati: II  TM Distance: >3 FB Neck ROM: Full    Dental no notable dental hx.    Pulmonary neg pulmonary ROS,    Pulmonary exam normal breath sounds clear to auscultation       Cardiovascular + angina + CAD and +CHF  Normal cardiovascular exam Rhythm:Regular Rate:Normal  - Left ventricle: The cavity size was moderately dilated. There was   mild concentric hypertrophy. Systolic function was moderately to   severely reduced. The estimated ejection fraction was in the   range of 30% to 35%. Wall motion was normal; there was diffuse   hypokinesis and akinesis of the basal inferior and inferoseptal   walls. Features are consistent with a pseudonormal left   ventricular filling pattern, with concomitant abnormal relaxation   and increased filling pressure (grade 2 diastolic dysfunction).   Doppler parameters are consistent with elevated ventricular   end-diastolic filling pressure. - Aortic valve: Trileaflet; mildly thickened, mildly calcified   leaflets. There was no regurgitation. - Aortic root: The aortic root was normal in size. - Mitral valve: Calcified annulus. Mildly thickened leaflets .   There was mild regurgitation. - Left atrium: The atrium was moderately dilated. - Right ventricle: Systolic function was normal. - Right atrium: The atrium was moderately dilated. - Tricuspid valve: There was mild regurgitation. - Pulmonic valve: There was mild regurgitation. - Inferior vena cava: The vessel was normal in size. - Pericardium, extracardiac: A trivial pericardial effusion was   identified posterior to the heart. There was a large left pleural    effusion.  ------------------------------------------------------------------- Study data:  No prior study was available for comparison.  Study status:  STAT.  Procedure:  The patient reported no pain pre or post test. Transthoracic echocardiography. Image quality was adequate.  Study completion:  There were no complications. Transthoracic echocardiography.  M-mode, complete 2D, spectral Doppler, and color Doppler.  Birthdate:  Patient birthdate: 1941-01-01.  Age:  Patient is 76 yr old.  Sex:  Gender: male. BMI: 26.3 kg/m^2.  Blood pressure:     152/101  Patient status: Inpatient.  Study date:  Study date: 10/15/2016. Study time: 11:20 AM.  Location:  Emergency department.  -------------------------------------------------------------------  ------------------------------------------------------------------- Left ventricle:  The cavity size was moderately dilated. There was mild concentric hypertrophy. Systolic function was moderately to severely reduced. The estimated ejection fraction was in the range of 30% to 35%. Wall motion was normal; there was diffuse hypokinesis and akinesis of the basal inferior and inferoseptal walls. Features are consistent with a pseudonormal left ventricular filling pattern, with concomitant abnormal relaxation and increased filling pressure (grade 2 diastolic dysfunction). Doppler parameters are consistent with elevated ventricular end-diastolic filling pressure.    Neuro/Psych negative neurological ROS  negative psych ROS   GI/Hepatic negative GI ROS, Neg liver ROS,   Endo/Other  negative endocrine ROS  Renal/GU negative Renal ROS  negative genitourinary   Musculoskeletal negative musculoskeletal ROS (+)   Abdominal   Peds negative pediatric ROS (+)  Hematology negative hematology ROS (+)   Anesthesia Other Findings   Reproductive/Obstetrics negative OB ROS                             Anesthesia  Physical Anesthesia Plan  ASA: IV  Anesthesia Plan:  General   Post-op Pain Management:    Induction: Intravenous  PONV Risk Score and Plan: 2 and Ondansetron and Dexamethasone  Airway Management Planned: Oral ETT  Additional Equipment: Arterial line, CVP, PA Cath, TEE and Ultrasound Guidance Line Placement  Intra-op Plan:   Post-operative Plan: Post-operative intubation/ventilation  Informed Consent: I have reviewed the patients History and Physical, chart, labs and discussed the procedure including the risks, benefits and alternatives for the proposed anesthesia with the patient or authorized representative who has indicated his/her understanding and acceptance.   Dental advisory given  Plan Discussed with: CRNA and Surgeon  Anesthesia Plan Comments:         Anesthesia Quick Evaluation

## 2016-10-18 NOTE — Progress Notes (Signed)
[facility] BRIEF SICU PROGRESS NOTE  Day of Surgery  S/P Procedure(s) (LRB): CORONARY ARTERY BYPASS GRAFTING (CABG) x five , using left internal mammary artery and right leg greater saphenous vein harvested endoscopically (N/A) TRANSESOPHAGEAL ECHOCARDIOGRAM (TEE) (N/A)   Extubated uneventfully NSR w/ stable hemodynamics on low dose Neo, dopamine and milrinone drips Breathing comfortably w/ O2 sats 96% on 4 L/min Chest tube output low UOP adequate Labs okay  Plan: Continue routine early postop  Rexene Alberts, MD 10/18/2016 8:19 PM

## 2016-10-18 NOTE — Transfer of Care (Signed)
Immediate Anesthesia Transfer of Care Note  Patient: Jason Day  Procedure(s) Performed: Procedure(s): CORONARY ARTERY BYPASS GRAFTING (CABG) x five , using left internal mammary artery and right leg greater saphenous vein harvested endoscopically (N/A) TRANSESOPHAGEAL ECHOCARDIOGRAM (TEE) (N/A)  Patient Location: SICU  Anesthesia Type:General  Level of Consciousness: Patient remains intubated per anesthesia plan  Airway & Oxygen Therapy: Patient remains intubated per anesthesia plan and Patient placed on Ventilator (see vital sign flow sheet for setting)  Post-op Assessment: Report given to RN and Post -op Vital signs reviewed and stable  Post vital signs: Reviewed and stable  Last Vitals:  Vitals:   10/18/16 0453 10/18/16 1349  BP: (!) 94/57 (!) 105/38  Pulse: 73 90  Resp: 20 14  Temp: 36.4 C   SpO2: 97% 97%    Last Pain:  Vitals:   10/18/16 0453  TempSrc: Oral  PainSc:          Complications: No apparent anesthesia complications

## 2016-10-18 NOTE — Anesthesia Procedure Notes (Signed)
Procedure Name: Intubation Date/Time: 10/18/2016 7:50 AM Performed by: Manuela Schwartz B Pre-anesthesia Checklist: Patient identified, Emergency Drugs available, Suction available and Patient being monitored Patient Re-evaluated:Patient Re-evaluated prior to induction Oxygen Delivery Method: Circle System Utilized Preoxygenation: Pre-oxygenation with 100% oxygen Induction Type: IV induction and Rapid sequence Ventilation: Mask ventilation with difficulty and Two handed mask ventilation required Laryngoscope Size: Mac and 3 Grade View: Grade I Tube type: Subglottic suction tube Tube size: 7.5 mm Number of attempts: 1 Airway Equipment and Method: Stylet and Oral airway Placement Confirmation: ETT inserted through vocal cords under direct vision,  positive ETCO2 and breath sounds checked- equal and bilateral Secured at: 22 cm Tube secured with: Tape Dental Injury: Teeth and Oropharynx as per pre-operative assessment

## 2016-10-19 ENCOUNTER — Inpatient Hospital Stay (HOSPITAL_COMMUNITY): Payer: Medicare Other

## 2016-10-19 ENCOUNTER — Encounter (HOSPITAL_COMMUNITY): Payer: Self-pay | Admitting: Surgery

## 2016-10-19 DIAGNOSIS — Z951 Presence of aortocoronary bypass graft: Secondary | ICD-10-CM

## 2016-10-19 LAB — CBC
HCT: 35.6 % — ABNORMAL LOW (ref 39.0–52.0)
HEMATOCRIT: 36.3 % — AB (ref 39.0–52.0)
HEMOGLOBIN: 12.1 g/dL — AB (ref 13.0–17.0)
HEMOGLOBIN: 11.9 g/dL — AB (ref 13.0–17.0)
MCH: 30 pg (ref 26.0–34.0)
MCH: 29.5 pg (ref 26.0–34.0)
MCHC: 34 g/dL (ref 30.0–36.0)
MCHC: 32.8 g/dL (ref 30.0–36.0)
MCV: 88.1 fL (ref 78.0–100.0)
MCV: 89.9 fL (ref 78.0–100.0)
Platelets: 117 10*3/uL — ABNORMAL LOW (ref 150–400)
Platelets: 96 10*3/uL — ABNORMAL LOW (ref 150–400)
RBC: 4.04 MIL/uL — AB (ref 4.22–5.81)
RBC: 4.04 MIL/uL — ABNORMAL LOW (ref 4.22–5.81)
RDW: 14.4 % (ref 11.5–15.5)
RDW: 14.6 % (ref 11.5–15.5)
WBC: 15.2 10*3/uL — AB (ref 4.0–10.5)
WBC: 12.4 10*3/uL — AB (ref 4.0–10.5)

## 2016-10-19 LAB — POCT I-STAT, CHEM 8
BUN: 25 mg/dL — AB (ref 6–20)
CALCIUM ION: 1.05 mmol/L — AB (ref 1.15–1.40)
Chloride: 97 mmol/L — ABNORMAL LOW (ref 101–111)
Creatinine, Ser: 1.1 mg/dL (ref 0.61–1.24)
GLUCOSE: 111 mg/dL — AB (ref 65–99)
HCT: 34 % — ABNORMAL LOW (ref 39.0–52.0)
Hemoglobin: 11.6 g/dL — ABNORMAL LOW (ref 13.0–17.0)
Potassium: 4.6 mmol/L (ref 3.5–5.1)
SODIUM: 134 mmol/L — AB (ref 135–145)
TCO2: 26 mmol/L (ref 22–32)

## 2016-10-19 LAB — BASIC METABOLIC PANEL
ANION GAP: 6 (ref 5–15)
BUN: 26 mg/dL — ABNORMAL HIGH (ref 6–20)
CHLORIDE: 104 mmol/L (ref 101–111)
CO2: 25 mmol/L (ref 22–32)
Calcium: 7.7 mg/dL — ABNORMAL LOW (ref 8.9–10.3)
Creatinine, Ser: 1.2 mg/dL (ref 0.61–1.24)
GFR calc non Af Amer: 57 mL/min — ABNORMAL LOW (ref >60)
Glucose, Bld: 116 mg/dL — ABNORMAL HIGH (ref 65–99)
Potassium: 4.6 mmol/L (ref 3.5–5.1)
SODIUM: 135 mmol/L (ref 135–145)

## 2016-10-19 LAB — CREATININE, SERUM
CREATININE: 1.18 mg/dL (ref 0.61–1.24)
GFR, EST NON AFRICAN AMERICAN: 59 mL/min — AB (ref >60)

## 2016-10-19 LAB — COOXEMETRY PANEL
CARBOXYHEMOGLOBIN: 1.8 % — AB (ref 0.5–1.5)
Methemoglobin: 1.1 % (ref 0.0–1.5)
O2 SAT: 73 %
TOTAL HEMOGLOBIN: 11.7 g/dL — AB (ref 12.0–16.0)

## 2016-10-19 LAB — MAGNESIUM
MAGNESIUM: 3 mg/dL — AB (ref 1.7–2.4)
MAGNESIUM: 2.8 mg/dL — AB (ref 1.7–2.4)

## 2016-10-19 LAB — GLUCOSE, CAPILLARY
GLUCOSE-CAPILLARY: 98 mg/dL (ref 65–99)
Glucose-Capillary: 115 mg/dL — ABNORMAL HIGH (ref 65–99)
Glucose-Capillary: 101 mg/dL — ABNORMAL HIGH (ref 65–99)
Glucose-Capillary: 176 mg/dL — ABNORMAL HIGH (ref 65–99)

## 2016-10-19 MED ORDER — AMIODARONE HCL IN DEXTROSE 360-4.14 MG/200ML-% IV SOLN
30.0000 mg/h | INTRAVENOUS | Status: DC
  Administered 2016-10-20 (×2): 30 mg/h via INTRAVENOUS
  Filled 2016-10-19: qty 200

## 2016-10-19 MED ORDER — AMIODARONE HCL IN DEXTROSE 360-4.14 MG/200ML-% IV SOLN
60.0000 mg/h | INTRAVENOUS | Status: AC
  Administered 2016-10-19 – 2016-10-20 (×2): 60 mg/h via INTRAVENOUS
  Filled 2016-10-19 (×2): qty 200

## 2016-10-19 MED ORDER — CHLORHEXIDINE GLUCONATE CLOTH 2 % EX PADS
6.0000 | MEDICATED_PAD | Freq: Every day | CUTANEOUS | Status: DC
  Administered 2016-10-20: 6 via TOPICAL

## 2016-10-19 MED ORDER — AMIODARONE LOAD VIA INFUSION
150.0000 mg | Freq: Once | INTRAVENOUS | Status: AC
  Administered 2016-10-19: 150 mg via INTRAVENOUS
  Filled 2016-10-19: qty 83.34

## 2016-10-19 MED ORDER — ENOXAPARIN SODIUM 40 MG/0.4ML ~~LOC~~ SOLN
40.0000 mg | Freq: Every day | SUBCUTANEOUS | Status: DC
  Administered 2016-10-19: 40 mg via SUBCUTANEOUS
  Filled 2016-10-19: qty 0.4

## 2016-10-19 MED ORDER — FUROSEMIDE 10 MG/ML IJ SOLN
40.0000 mg | Freq: Once | INTRAMUSCULAR | Status: AC
  Administered 2016-10-19: 40 mg via INTRAVENOUS
  Filled 2016-10-19: qty 4

## 2016-10-19 MED FILL — Heparin Sodium (Porcine) Inj 1000 Unit/ML: INTRAMUSCULAR | Qty: 2500 | Status: AC

## 2016-10-19 NOTE — Progress Notes (Signed)
Progress Note  Patient Name: Jason Day Date of Encounter: 10/19/2016  Primary Cardiologist: Johnsie Cancel  Subjective   Doing well less pain than expected  Inpatient Medications    Scheduled Meds: . acetaminophen  1,000 mg Oral Q6H   Or  . acetaminophen (TYLENOL) oral liquid 160 mg/5 mL  1,000 mg Per Tube Q6H  . acetaminophen (TYLENOL) oral liquid 160 mg/5 mL  650 mg Per Tube Once   Or  . acetaminophen  650 mg Rectal Once  . aspirin EC  325 mg Oral Daily   Or  . aspirin  324 mg Per Tube Daily  . bisacodyl  10 mg Oral Daily   Or  . bisacodyl  10 mg Rectal Daily  . chlorhexidine gluconate (MEDLINE KIT)  15 mL Mouth Rinse BID  . docusate sodium  200 mg Oral Daily  . enoxaparin (LOVENOX) injection  40 mg Subcutaneous QHS  . furosemide  40 mg Intravenous Once  . mouth rinse  15 mL Mouth Rinse QID  . [START ON 10/20/2016] pantoprazole  40 mg Oral Daily  . rosuvastatin  10 mg Oral QHS  . sodium chloride flush  3 mL Intravenous Q12H   Continuous Infusions: . sodium chloride 20 mL/hr at 10/18/16 1900  . sodium chloride 250 mL (10/19/16 0600)  . sodium chloride    . cefUROXime (ZINACEF)  IV Stopped (10/19/16 2774)  . DOPamine 3 mcg/kg/min (10/19/16 0700)  . lactated ringers    . lactated ringers    . lactated ringers 20 mL/hr at 10/18/16 1900  . milrinone 0.2 mcg/kg/min (10/19/16 0748)  . nitroGLYCERIN    . phenylephrine (NEO-SYNEPHRINE) Adult infusion Stopped (10/19/16 0500)   PRN Meds: sodium chloride, lactated ringers, morphine injection, ondansetron (ZOFRAN) IV, oxyCODONE, sodium chloride flush, traMADol   Vital Signs    Vitals:   10/19/16 0615 10/19/16 0630 10/19/16 0645 10/19/16 0700  BP:    93/62  Pulse: 90 90 86 81  Resp: (!) 21 20 18 20   Temp: 98.8 F (37.1 C) 98.6 F (37 C) 98.4 F (36.9 C) 98.4 F (36.9 C)  TempSrc:    Core (Comment)  SpO2: 97% 97% 98% 98%  Weight:      Height:        Intake/Output Summary (Last 24 hours) at 10/19/16 0810 Last data  filed at 10/19/16 0800  Gross per 24 hour  Intake          6824.32 ml  Output             4310 ml  Net          2514.32 ml   Filed Weights   10/17/16 0457 10/18/16 0453 10/19/16 0500  Weight: 185 lb 14.4 oz (84.3 kg) 182 lb 8 oz (82.8 kg) 194 lb 9.6 oz (88.3 kg)    Telemetry    NSR - Personally Reviewed  ECG    NSR no acute ECG changes  - Personally Reviewed  Physical Exam  Right IJ  GEN: No acute distress.   Neck: No JVD Cardiac: RRR,rub post sternotomy Respiratory: Clear to auscultation bilaterally. GI: Soft, nontender, non-distended  MS: No edema; No deformity. Neuro:  Nonfocal  Psych: Normal affect   Labs    Chemistry Recent Labs Lab 10/15/16 0752 10/16/16 0934 10/17/16 0607 10/18/16 0345  10/18/16 1239 10/18/16 1402 10/18/16 1934 10/18/16 1938 10/19/16 0301  NA 139 139 139 139  < > 137 139  --  139 135  K 4.4 4.1 3.7 3.9  < >  4.0 3.7  --  5.0 4.6  CL 105 102 99* 98*  < > 98*  --   --  103 104  CO2 25 26 29  32  --   --   --   --   --  25  GLUCOSE 101* 88 80 93  < > 155* 121*  --  122* 116*  BUN 23* 24* 30* 36*  < > 30*  --   --  29* 26*  CREATININE 1.23 1.31* 1.28* 1.49*  < > 1.20  --  1.25* 1.10 1.20  CALCIUM 9.1 8.9 8.8* 9.2  --   --   --   --   --  7.7*  PROT 7.1 6.7  --   --   --   --   --   --   --   --   ALBUMIN 4.2 4.0  --   --   --   --   --   --   --   --   AST 45* 38  --   --   --   --   --   --   --   --   ALT 61 54  --   --   --   --   --   --   --   --   ALKPHOS 73 64  --   --   --   --   --   --   --   --   BILITOT 1.8* 3.3*  --   --   --   --   --   --   --   --   GFRNONAA 56* 52* 53* 44*  --   --   --  55*  --  57*  GFRAA >60 60* >60 51*  --   --   --  >60  --  >60  ANIONGAP 9 11 11 9   --   --   --   --   --  6  < > = values in this interval not displayed.   Hematology Recent Labs Lab 10/18/16 1404 10/18/16 1934 10/18/16 1938 10/19/16 0301  WBC 13.6* 13.1*  --  15.2*  RBC 4.67 4.43  --  4.04*  HGB 13.4 13.3 12.2* 12.1*  HCT  41.2 39.4 36.0* 35.6*  MCV 88.2 88.9  --  88.1  MCH 28.7 30.0  --  30.0  MCHC 32.5 33.8  --  34.0  RDW 14.4 14.6  --  14.4  PLT 127* 106*  --  117*    Cardiac Enzymes Recent Labs Lab 10/15/16 1155 10/15/16 1827  TROPONINI 0.42* 0.31*    Recent Labs Lab 10/15/16 0803  TROPIPOC 0.40*     BNP Recent Labs Lab 10/15/16 0752  BNP 350.5*     DDimer  Recent Labs Lab 10/15/16 0752  DDIMER 1.05*     Radiology    Dg Chest Port 1 View  Result Date: 10/19/2016 CLINICAL DATA:  Chest tube present, status post cardiac surgery EXAM: PORTABLE CHEST 1 VIEW COMPARISON:  10/18/2016 FINDINGS: Indwelling left chest tube and mediastinal drain. No pneumothorax is seen. Lungs are otherwise clear.  No definite pleural effusions. Postsurgical changes related to prior CABG. Median sternotomy. Right IJ Swan-Ganz catheter terminates in the main pulmonary artery. Interval removal of endotracheal and enteric tubes. IMPRESSION: Interval removal of endotracheal and enteric tubes. Indwelling left chest tube and mediastinal drain. No pneumothorax is seen. Right IJ Swan-Ganz catheter  terminates in the main pulmonary artery. Electronically Signed   By: Julian Hy M.D.   On: 10/19/2016 07:47   Dg Chest Port 1 View  Result Date: 10/18/2016 CLINICAL DATA:  Patient status post CABG procedure. EXAM: PORTABLE CHEST 1 VIEW COMPARISON:  Chest CT 09/07/2016; chest radiograph 10/15/2016 FINDINGS: ET tube terminates in the mid trachea. Enteric tube courses inferior to the diaphragm. Right IJ approach pulmonary arterial catheter projects over the main pulmonary artery. Left chest tube in place. Stable enlarged cardiac and mediastinal contours. Bibasilar atelectasis. Lucency along the left superior mediastinum. Unremarkable osseous skeleton. IMPRESSION: Support apparatus as above. Lucency along the superior left mediastinum may represent small amount of pneumomediastinum versus medial left pneumothorax. Left chest  tube in place. Recommend attention on follow-up. Electronically Signed   By: Lovey Newcomer M.D.   On: 10/18/2016 14:18    Cardiac Studies   Echo EF 35%  Cath LM severe 3VD total RCA with collaterals  Patient Profile     76 y.o. male with new onset CHF EF 35% cath with LM/3VD total RCA with collaterls Now post CABG  Assessment & Plan    1) CAD/CABG:  Progressing well appreciate Dr Vivi Martens timely surgery. Will need 3 mont Post echo /MRI to risk stratify for AICD need. Try to get back on low dose coreg and ACE Prior to d/c BP soft now pressors off rhythm stable sternotomy stable   Signed, Jenkins Rouge, MD  10/19/2016, 8:10 AM

## 2016-10-19 NOTE — Discharge Summary (Signed)
Physician Discharge Summary  Patient ID: Jason Day MRN: [id number] DOB/AGE: [birth year] 76 y.o.  Admit date: 10/15/2016 Discharge date: 10/24/2016  Admission Diagnoses:  Patient Active Problem List   Diagnosis Date Noted  . Elevated troponin 10/16/2016  . Elevated LDL cholesterol level 10/16/2016  . Acute combined systolic and diastolic congestive heart failure ([place]) 10/16/2016  . 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 Elevated troponin 10/16/2016  . Elevated LDL cholesterol level 10/16/2016  . Acute combined systolic and diastolic congestive heart failure ([place]) 10/16/2016  . 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 is a 76 yo white male with no significant medical history.  He recently moved her from [place].  He states he was followed by a Cardiologist, with most recent stress test being a year ago which was reportedly negative.  He has been busy working on his house this summer.  He did fine with it until approximately 2 weeks ago when he developed shortness of breath with exertion.  He went to an urgent care who provided the patient a course of steroids and an inhaler which provided some relief.  However on 8/27 he woke up unable to breath and presented to the ED at [facility].  CXR showed evidence of pulmonary edema and CTA was negative for PE.  He was give IV lasix with improvement in symptoms.  Echocardiogram was obtained and showed a reduced EF of 30-35% with diffuse hypokinesis and akineses of the basal inferior and inferoseptal wall.  He was subsequently transferred to [facility] for further care.  Hospital Course:   He underwent cardiac  catheterization which showed significant CAD with LM involvement.  It was felt coronary bypass would be indicated and TCTS consult was requested.  Dr. Cyndia Bent evaluated the patient and was in agreement the patient would require bypass surgery.  The risks and benefits of the procedure were explained to the patient and he was agreeable to proceed.  He was taken to the operating room on 10/18/2016.  He underwent CABG x 5 utilizing LIMA to LAD, SVG to OM 1, Sequential SVG to OM2 and OM3, and SVG to Diagonal 2.  He also underwent endoscopic harvest of greater saphenous vein from right leg.  He tolerated the procedure without difficulty and was taken to the SICU in stable condition.  He was extubated the evening of surgery.  During his stay in the SICU the patient was weaned off Neo-synephrine, Dopamine, and Milrinone drips as tolerated.  His chest tubes and arterial lines were removed without difficulty. He went into a fib and was put on Amiodarone. Digoxin was then added. He remained in a fib with a controlled ventricular rate. He was started on Coumadin. His PT and INR were monitored daily. He is currently on Coumadin 5 mg and his latest PT and INR are 15.3 and 1.22.He was volume over loaded and diuresed. He had ABL anemia. He did not require a post op transfusion. Last H and H was 11.4 and 35.8. He also had thrombocytopenia. His last platelet count was up to 141,000. He was weaned off the insulin drip.  The patient's HGA1C pre op was 5.5. The  patient was felt surgically stable for transfer from the ICU to [facility] for further convalescence on 10/21/2016. He continues to progress with cardiac rehab. He was ambulating on room air. He has been tolerating a diet and has had a bowel movement. He was started on low dose Lisinopril for better BP control.Epicardial pacing wires were removed on 10/23/2016. Chest tube sutures will be removed in the office after discharge. Per Dr. Cyndia Bent, the patient is felt surgically stable for  discharge today.  Significant Diagnostic Studies: angiography:    Severe, calcific multivessel coronary artery disease.  Ostial and distal left main stenoses greater than 50%. Depending upon view/ In worst projection ostial left main is 75% and distal is 70% obstructed.  99% mid LAD and 90% distal LAD. The apical LAD is totally occluded.  75% ostial first obtuse marginal, 60% mid circumflex, and 50% ostial second obtuse marginal.  Total occlusion of the mid RCA with faint left-to-right collaterals into what appears to be a relatively small PDA and LV branch.  Normal pulmonary artery pressures.  Ischemic cardiomyopathy with normal left ventricular filling pressures and LVEDP of 15 mmHg suggesting excellent therapy of acute on chronic systolic heart failure.  Treatments: Surgery  1. Median Sternotomy 2. Extracorporeal circulation 3.   Coronary artery bypass grafting x 5   Left internal mammary graft to the LAD  SVG to Diagonal 2  SVG to OM1  Sequential SVG to OM2 and OM3   4.   Endoscopic vein harvest from the right leg  Disposition: Home  Discharge Medications:  The patient has been discharged on:   1.Beta Blocker:  Yes [   ]                              No   [  x ]                              If No, reason:On Amiodarone and Digoxin, bradycardia.  2.Ace Inhibitor/ARB: Yes [  x ]                                     No  [    ]                                     If No, reason:  3.Statin:   Yes [ x  ]                  No  [   ]                  If No, reason:  4.Ecasa:  Yes  [x   ]                  No   [   ]                  If No, reason:  Allergies as of 10/24/2016   No Known Allergies     Medication List    STOP taking these medications   POTASSIUM PO     TAKE these medications   acetaminophen 325 MG tablet Commonly known as:  TYLENOL Take 2 tablets (650 mg  total) by mouth every 6 (six) hours as needed for mild pain.   albuterol 108 (90  Base) MCG/ACT inhaler Commonly known as:  PROVENTIL HFA;VENTOLIN HFA Inhale 1 puff into the lungs every 6 (six) hours as needed for wheezing or shortness of breath.   amiodarone 200 MG tablet Commonly known as:  PACERONE Take 1 tablet (200 mg total) by mouth 2 (two) times daily. For one week then take Amiodarone 200 mg by mouth daily thereafter.   aspirin EC 81 MG tablet Take 81 mg by mouth daily.   digoxin 0.125 MG tablet Commonly known as:  LANOXIN Take 1 tablet (0.125 mg total) by mouth daily.   diphenhydrAMINE 25 MG tablet Commonly known as:  BENADRYL Take 25 mg by mouth daily as needed for allergies.   furosemide 40 MG tablet Commonly known as:  LASIX Take 1 tablet (40 mg total) by mouth daily. for 5 days then stop.   lisinopril 2.5 MG tablet Commonly known as:  PRINIVIL,ZESTRIL Take 1 tablet (2.5 mg total) by mouth daily.   oxymetazoline 0.05 % nasal spray Commonly known as:  AFRIN Place 1 spray into both nostrils at bedtime.   Potassium Chloride ER 20 MEQ Tbcr Take 20 mEq by mouth daily.   rosuvastatin 10 MG tablet Commonly known as:  CRESTOR Take 1 tablet (10 mg total) by mouth at bedtime.   traMADol 50 MG tablet Commonly known as:  ULTRAM Take 50 mg by mouth every 4-6 hours PRN moderate or severe pain.   warfarin 5 MG tablet Commonly known as:  COUMADIN Take 1 tablet (5 mg total) by mouth daily at 6 PM. Or as directed.            Discharge Care Instructions        Start     Ordered   10/24/16 0000  acetaminophen (TYLENOL) 325 MG tablet  Every 6 hours PRN     10/24/16 0908   10/24/16 0000  amiodarone (PACERONE) 200 MG tablet  2 times daily    Question:  Supervising Provider  Answer:  Gaye Pollack   10/24/16 0908   10/24/16 0000  digoxin (LANOXIN) 0.125 MG tablet  Daily    Question:  Supervising Provider  Answer:  Rexene Alberts   10/24/16 0908   10/24/16 0000  rosuvastatin (CRESTOR) 10 MG tablet  Daily at bedtime    Question:  Supervising  Provider  Answer:  Gilford Raid K   10/24/16 0908   10/24/16 0000  warfarin (COUMADIN) 5 MG tablet  Daily-1800    Question:  Supervising Provider  Answer:  Gilford Raid K   10/24/16 0908   10/24/16 0000  furosemide (LASIX) 40 MG tablet  Daily    Question:  Supervising Provider  Answer:  Gilford Raid K   10/24/16 0908   10/24/16 0000  traMADol (ULTRAM) 50 MG tablet    Question:  Supervising Provider  Answer:  Gilford Raid K   10/24/16 0908   10/24/16 0000  potassium chloride 20 MEQ TBCR  Daily    Question:  Supervising Provider  Answer:  Gilford Raid K   10/24/16 0908   10/24/16 0000  lisinopril (PRINIVIL,ZESTRIL) 2.5 MG tablet  Daily    Question:  Supervising Provider  Answer:  Gaye Pollack   10/24/16 3149     Follow-up Information    Gaye Pollack, MD Follow up on 11/21/2016.   Specialty:  Cardiothoracic Surgery Why:  Appoinment is at 10:00, please get CXR  at 9:30 at [place] located on first floor of our office Contact information: [address] [place] [address] [phone]        [facility] Follow up on 10/29/2016.   Specialty:  Cardiothoracic Surgery Why:  Appointment is at 10:00 for suture removal Contact information: [place], [place], [place], [place]. Go on 11/06/2016.   Specialties:  Nurse Practitioner, Interventional Cardiology, Cardiology, Radiology Why:  @11 :30 am for post hospital follow up Contact information: [place]. [address] [place] [address] [phone]        [facility] Follow up on 10/26/2016.   Specialty:  Cardiology Why:  Appointment is for PT and INR to be drawn (as is on Couamdin for a fib, INR 2-2.5) and appointment time is at 2:30 pm Contact information: [address], [place] Follow up.   Why:  To obtain the names of local doctors accepting new patients call: [facility] at [phone]          Signed: Nani Skillern 10/24/2016, 9:56 AM

## 2016-10-19 NOTE — Progress Notes (Addendum)
1 Day Post-Op Procedure(s) (LRB): CORONARY ARTERY BYPASS GRAFTING (CABG) x five , using left internal mammary artery and right leg greater saphenous vein harvested endoscopically (N/A) TRANSESOPHAGEAL ECHOCARDIOGRAM (TEE) (N/A) Subjective: No complaints  Objective: Vital signs in last 24 hours: Temp:  [97.5 F (36.4 C)-100 F (37.8 C)] 98.4 F (36.9 C) (08/31 0700) Pulse Rate:  [68-100] 81 (08/31 0700) Cardiac Rhythm: Normal sinus rhythm (08/31 0700) Resp:  [12-32] 20 (08/31 0700) BP: (79-105)/(38-63) 93/62 (08/31 0700) SpO2:  [92 %-99 %] 98 % (08/31 0700) Arterial Line BP: (88-164)/(21-66) 128/50 (08/31 0700) FiO2 (%):  [50 %] 50 % (08/30 1349) Weight:  [88.3 kg (194 lb 9.6 oz)] 88.3 kg (194 lb 9.6 oz) (08/31 0500)  Hemodynamic parameters for last 24 hours: PAP: (16-49)/(7-26) 32/15 CO:  [3.6 L/min-7.6 L/min] 6.1 L/min CI:  [1.5 L/min/m2-3.7 L/min/m2] 2.9 L/min/m2  Intake/Output from previous day: 08/30 0701 - 08/31 0700 In: 6824.3 [P.O.:850; I.V.:3976.3; Blood:698; IV Piggyback:1300] Out: 6962 [Urine:2270; Blood:1400; Chest Tube:570] Intake/Output this shift: No intake/output data recorded.  General appearance: alert and cooperative Neurologic: intact Heart: regular rate and rhythm, S1, S2 normal, no murmur, click, rub or gallop Lungs: clear to auscultation bilaterally Extremities: extremities normal, atraumatic, no cyanosis or edema Wound: dressing dry  Lab Results:  Recent Labs  10/18/16 1934 10/18/16 1938 10/19/16 0301  WBC 13.1*  --  15.2*  HGB 13.3 12.2* 12.1*  HCT 39.4 36.0* 35.6*  PLT 106*  --  117*   BMET:  Recent Labs  10/18/16 0345  10/18/16 1938 10/19/16 0301  NA 139  < > 139 135  K 3.9  < > 5.0 4.6  CL 98*  < > 103 104  CO2 32  --   --  25  GLUCOSE 93  < > 122* 116*  BUN 36*  < > 29* 26*  CREATININE 1.49*  < > 1.10 1.20  CALCIUM 9.2  --   --  7.7*  < > = values in this interval not displayed.  PT/INR:  Recent Labs  10/18/16 1404   LABPROT 17.4*  INR 1.43   ABG    Component Value Date/Time   PHART 7.332 (L) 10/18/2016 1946   HCO3 25.4 10/18/2016 1946   TCO2 27 10/18/2016 1946   ACIDBASEDEF 1.0 10/18/2016 1946   O2SAT 73.0 10/19/2016 0336   CBG (last 3)   Recent Labs  10/18/16 1746 10/18/16 2320 10/19/16 0307  GLUCAP 105* 134* 115*   CXR: ok  ECG: sinus, old IMI  Assessment/Plan: S/P Procedure(s) (LRB): CORONARY ARTERY BYPASS GRAFTING (CABG) x five , using left internal mammary artery and right leg greater saphenous vein harvested endoscopically (N/A) TRANSESOPHAGEAL ECHOCARDIOGRAM (TEE) (N/A)  He is hemodynamically stable postop day 1 on milrinone 0.3 and dop 3. Preop EF 25% but looked better post-bypass. Wean off dopamine and decrease milrinone to 0.2. Co-ox is 73%. Mobilize Diuresis d/c tubes/lines Continue foley due to patient in ICU and urinary output monitoring See progression orders   LOS: 4 days    Gaye Pollack 10/19/2016

## 2016-10-19 NOTE — Care Management Note (Signed)
Case Management Note Marvetta Gibbons RN, BSN Unit 4E-Case Manager-- [place] coverage [phone]  Patient Details  Name: Hagan Vanauken MRN: [id number] Date of Birth: Jul 12, 1940  Subjective/Objective: Pt admitted with SOB, CHF-- per cath found severe left main and MVD- s/p  CABGx5 on 10/18/16                Action/Plan: PTA Pt lived at home with wife- recently moved here from [place] this summer- independent- CM will follow for d/c needs- will need PCP for discharge  Expected Discharge Date:   (unknown)               Expected Discharge Plan:  Home/Self Care  In-House Referral:     Discharge planning Services  CM Consult, [facility]  Post Acute Care Choice:    Choice offered to:     DME Arranged:    DME Agency:     HH Arranged:    [place] Agency:     Status of Service:  In process, will continue to follow  If discussed at Long Length of Stay Meetings, dates discussed:    Discharge Disposition:   Additional Comments:  Dawayne Patricia, RN 10/19/2016, 10:48 AM

## 2016-10-19 NOTE — Progress Notes (Signed)
Patient ID: Jason Day, male   DOB: Jun 05, 1940, 76 y.o.   MRN: [id number]  SICU Evening Rounds  Hemodynamically stable  Ambulated  Diuresing well  Says he feels well. No pain.

## 2016-10-20 ENCOUNTER — Inpatient Hospital Stay (HOSPITAL_COMMUNITY): Payer: Medicare Other

## 2016-10-20 LAB — BASIC METABOLIC PANEL
ANION GAP: 8 (ref 5–15)
BUN: 25 mg/dL — ABNORMAL HIGH (ref 6–20)
CALCIUM: 7.6 mg/dL — AB (ref 8.9–10.3)
CO2: 26 mmol/L (ref 22–32)
Chloride: 100 mmol/L — ABNORMAL LOW (ref 101–111)
Creatinine, Ser: 1.06 mg/dL (ref 0.61–1.24)
Glucose, Bld: 141 mg/dL — ABNORMAL HIGH (ref 65–99)
Potassium: 4.1 mmol/L (ref 3.5–5.1)
SODIUM: 134 mmol/L — AB (ref 135–145)

## 2016-10-20 LAB — COOXEMETRY PANEL
CARBOXYHEMOGLOBIN: 1.8 % — AB (ref 0.5–1.5)
METHEMOGLOBIN: 1.1 % (ref 0.0–1.5)
O2 SAT: 70.2 %
TOTAL HEMOGLOBIN: 12.2 g/dL (ref 12.0–16.0)

## 2016-10-20 LAB — CBC
HCT: 33.5 % — ABNORMAL LOW (ref 39.0–52.0)
HEMOGLOBIN: 11.3 g/dL — AB (ref 13.0–17.0)
MCH: 29.6 pg (ref 26.0–34.0)
MCHC: 33.7 g/dL (ref 30.0–36.0)
MCV: 87.7 fL (ref 78.0–100.0)
Platelets: 68 10*3/uL — ABNORMAL LOW (ref 150–400)
RBC: 3.82 MIL/uL — AB (ref 4.22–5.81)
RDW: 14.7 % (ref 11.5–15.5)
WBC: 9.2 10*3/uL (ref 4.0–10.5)

## 2016-10-20 MED ORDER — DIGOXIN 0.25 MG/ML IJ SOLN
0.2500 mg | Freq: Four times a day (QID) | INTRAMUSCULAR | Status: AC
  Administered 2016-10-20 (×2): 0.25 mg via INTRAVENOUS
  Filled 2016-10-20 (×2): qty 2

## 2016-10-20 MED ORDER — MILRINONE LACTATE IN DEXTROSE 20-5 MG/100ML-% IV SOLN
0.1000 ug/kg/min | INTRAVENOUS | Status: DC
  Administered 2016-10-20: 0.1 ug/kg/min via INTRAVENOUS

## 2016-10-20 MED ORDER — POTASSIUM CHLORIDE CRYS ER 20 MEQ PO TBCR
20.0000 meq | EXTENDED_RELEASE_TABLET | Freq: Two times a day (BID) | ORAL | Status: AC
  Administered 2016-10-20 (×2): 20 meq via ORAL
  Filled 2016-10-20 (×2): qty 1

## 2016-10-20 MED ORDER — DIGOXIN 0.25 MG/ML IJ SOLN
0.5000 mg | Freq: Once | INTRAMUSCULAR | Status: AC
  Administered 2016-10-20: 0.5 mg via INTRAVENOUS
  Filled 2016-10-20: qty 2

## 2016-10-20 NOTE — Progress Notes (Signed)
2 Days Post-Op Procedure(s) (LRB): CORONARY ARTERY BYPASS GRAFTING (CABG) x five , using left internal mammary artery and right leg greater saphenous vein harvested endoscopically (N/A) TRANSESOPHAGEAL ECHOCARDIOGRAM (TEE) (N/A) Subjective: No complaints. Walked 4 labs this am without chest pain or shortness of breath.  Objective: Vital signs in last 24 hours: Temp:  [97.5 F (36.4 C)-98.9 F (37.2 C)] 97.5 F (36.4 C) (09/01 0800) Pulse Rate:  [80-105] 89 (09/01 0700) Cardiac Rhythm: Atrial fibrillation (09/01 0630) Resp:  [14-25] 25 (09/01 0700) BP: (75-121)/(47-83) 108/83 (09/01 0700) SpO2:  [89 %-97 %] 96 % (09/01 0700) Arterial Line BP: (154)/(54) 154/54 (08/31 0900) Weight:  [87.8 kg (193 lb 9 oz)] 87.8 kg (193 lb 9 oz) (09/01 0600)  Hemodynamic parameters for last 24 hours:    Intake/Output from previous day: 08/31 0701 - 09/01 0700 In: 2336.1 [P.O.:1200; I.V.:1036.1; IV Piggyback:100] Out: 2160 [Urine:2110; Chest Tube:50] Intake/Output this shift: No intake/output data recorded.  General appearance: alert and cooperative Neurologic: intact Heart: irregularly irregular rhythm Lungs: clear to auscultation bilaterally Extremities: edema mild Wound: dressings dry  Lab Results:  Recent Labs  10/19/16 1735 10/19/16 1741 10/20/16 0333  WBC 12.4*  --  9.2  HGB 11.9* 11.6* 11.3*  HCT 36.3* 34.0* 33.5*  PLT 96*  --  68*   BMET:  Recent Labs  10/19/16 0301  10/19/16 1741 10/20/16 0333  NA 135  --  134* 134*  K 4.6  --  4.6 4.1  CL 104  --  97* 100*  CO2 25  --   --  26  GLUCOSE 116*  --  111* 141*  BUN 26*  --  25* 25*  CREATININE 1.20  < > 1.10 1.06  CALCIUM 7.7*  --   --  7.6*  < > = values in this interval not displayed.  PT/INR:  Recent Labs  10/18/16 1404  LABPROT 17.4*  INR 1.43   ABG    Component Value Date/Time   PHART 7.332 (L) 10/18/2016 1946   HCO3 25.4 10/18/2016 1946   TCO2 26 10/19/2016 1741   ACIDBASEDEF 1.0 10/18/2016 1946   O2SAT 70.2 10/20/2016 0350   CBG (last 3)   Recent Labs  10/19/16 0841 10/19/16 1232 10/19/16 2000  GLUCAP 98 101* 176*   CXR: clear  Assessment/Plan: S/P Procedure(s) (LRB): CORONARY ARTERY BYPASS GRAFTING (CABG) x five , using left internal mammary artery and right leg greater saphenous vein harvested endoscopically (N/A) TRANSESOPHAGEAL ECHOCARDIOGRAM (TEE) (N/A)   POD 2 He is hemodynamically stable but BP in high 80's on milrinone 0.2. Co-ox 70% so will wean off milrinone.  Postop atrial fib with RVR. Remains in atrial fib on amio 30/hr. His BP is on the low side so can't add beta blocker at this time. Will give him some digoxin to slow further and hopefully convert.  Hold on diuresis for now with borderline BP  Thrombocytopenia: plts dropped from 96 last night to 68 this am. Will stop Lovenox and observe.  Continue IS, ambulation.     LOS: 5 days    Gaye Pollack 10/20/2016

## 2016-10-20 NOTE — Progress Notes (Signed)
Patient ID: Jason Day, male   DOB: 06-Feb-1941, 76 y.o.   MRN: [id number] SICU Evening Rounds:  Hemodynamically stable  Back in sinus rhythm 90's on amio and digoxin.  Will turn Milrinone off.  Ambulating well

## 2016-10-21 LAB — BASIC METABOLIC PANEL
ANION GAP: 8 (ref 5–15)
BUN: 23 mg/dL — ABNORMAL HIGH (ref 6–20)
CALCIUM: 7.8 mg/dL — AB (ref 8.9–10.3)
CHLORIDE: 100 mmol/L — AB (ref 101–111)
CO2: 26 mmol/L (ref 22–32)
CREATININE: 0.97 mg/dL (ref 0.61–1.24)
GFR calc non Af Amer: >60 mL/min (ref >60)
Glucose, Bld: 110 mg/dL — ABNORMAL HIGH (ref 65–99)
Potassium: 4.7 mmol/L (ref 3.5–5.1)
SODIUM: 134 mmol/L — AB (ref 135–145)

## 2016-10-21 LAB — CBC
HCT: 33.9 % — ABNORMAL LOW (ref 39.0–52.0)
Hemoglobin: 11.2 g/dL — ABNORMAL LOW (ref 13.0–17.0)
MCH: 29.2 pg (ref 26.0–34.0)
MCHC: 33 g/dL (ref 30.0–36.0)
MCV: 88.3 fL (ref 78.0–100.0)
Platelets: 76 10*3/uL — ABNORMAL LOW (ref 150–400)
RBC: 3.84 MIL/uL — ABNORMAL LOW (ref 4.22–5.81)
RDW: 14.7 % (ref 11.5–15.5)
WBC: 9.4 10*3/uL (ref 4.0–10.5)

## 2016-10-21 MED ORDER — WARFARIN SODIUM 2.5 MG PO TABS
2.5000 mg | ORAL_TABLET | Freq: Every day | ORAL | Status: AC
  Administered 2016-10-21: 2.5 mg via ORAL
  Filled 2016-10-21: qty 1

## 2016-10-21 MED ORDER — BISACODYL 5 MG PO TBEC: 10.0000 mg | DELAYED_RELEASE_TABLET | Freq: Every day | ORAL | Status: DC | PRN

## 2016-10-21 MED ORDER — WARFARIN VIDEO
Freq: Once | Status: AC
  Administered 2016-10-22: 10:00:00

## 2016-10-21 MED ORDER — ONDANSETRON HCL 4 MG/2ML IJ SOLN: 4.0000 mg | Freq: Four times a day (QID) | INTRAMUSCULAR | Status: DC | PRN

## 2016-10-21 MED ORDER — PROMETHAZINE HCL 25 MG/ML IJ SOLN: 6.2500 mg | INTRAMUSCULAR | Status: DC | PRN

## 2016-10-21 MED ORDER — POTASSIUM CHLORIDE CRYS ER 20 MEQ PO TBCR
20.0000 meq | EXTENDED_RELEASE_TABLET | Freq: Every day | ORAL | Status: DC
  Administered 2016-10-21: 20 meq via ORAL
  Filled 2016-10-21: qty 1

## 2016-10-21 MED ORDER — OXYCODONE HCL 5 MG PO TABS: 5.0000 mg | ORAL_TABLET | ORAL | Status: DC | PRN

## 2016-10-21 MED ORDER — ACETAMINOPHEN 325 MG PO TABS: 650.0000 mg | ORAL_TABLET | Freq: Four times a day (QID) | ORAL | Status: DC | PRN

## 2016-10-21 MED ORDER — DOCUSATE SODIUM 100 MG PO CAPS
200.0000 mg | ORAL_CAPSULE | Freq: Every day | ORAL | Status: DC
  Administered 2016-10-21 – 2016-10-22 (×2): 200 mg via ORAL
  Filled 2016-10-21 (×2): qty 2

## 2016-10-21 MED ORDER — AMIODARONE HCL 200 MG PO TABS
400.0000 mg | ORAL_TABLET | Freq: Two times a day (BID) | ORAL | Status: DC
  Administered 2016-10-21 – 2016-10-24 (×7): 400 mg via ORAL
  Filled 2016-10-21 (×7): qty 2

## 2016-10-21 MED ORDER — PANTOPRAZOLE SODIUM 40 MG PO TBEC
40.0000 mg | DELAYED_RELEASE_TABLET | Freq: Every day | ORAL | Status: DC
  Administered 2016-10-21 – 2016-10-24 (×4): 40 mg via ORAL
  Filled 2016-10-21 (×4): qty 1

## 2016-10-21 MED ORDER — SODIUM CHLORIDE 0.9% FLUSH: 3.0000 mL | INTRAVENOUS | Status: DC | PRN

## 2016-10-21 MED ORDER — ONDANSETRON HCL 4 MG PO TABS: 4.0000 mg | ORAL_TABLET | Freq: Four times a day (QID) | ORAL | Status: DC | PRN

## 2016-10-21 MED ORDER — MOVING RIGHT ALONG BOOK
Freq: Once | Status: AC
  Administered 2016-10-21: 1
  Filled 2016-10-21: qty 1

## 2016-10-21 MED ORDER — WARFARIN - PHYSICIAN DOSING INPATIENT: Freq: Every day | Status: DC

## 2016-10-21 MED ORDER — FUROSEMIDE 40 MG PO TABS
40.0000 mg | ORAL_TABLET | Freq: Every day | ORAL | Status: DC
  Administered 2016-10-21 – 2016-10-24 (×4): 40 mg via ORAL
  Filled 2016-10-21 (×4): qty 1

## 2016-10-21 MED ORDER — PATIENT'S GUIDE TO USING COUMADIN BOOK
Freq: Once | Status: AC
  Administered 2016-10-21: 1
  Filled 2016-10-21 (×2): qty 1

## 2016-10-21 MED ORDER — DIGOXIN 125 MCG PO TABS
0.1250 mg | ORAL_TABLET | Freq: Every day | ORAL | Status: DC
  Administered 2016-10-21 – 2016-10-24 (×4): 0.125 mg via ORAL
  Filled 2016-10-21 (×4): qty 1

## 2016-10-21 MED ORDER — TRAMADOL HCL 50 MG PO TABS: 50.0000 mg | ORAL_TABLET | ORAL | Status: DC | PRN

## 2016-10-21 MED ORDER — SODIUM CHLORIDE 0.9% FLUSH
3.0000 mL | Freq: Two times a day (BID) | INTRAVENOUS | Status: DC
  Administered 2016-10-21 – 2016-10-23 (×4): 3 mL via INTRAVENOUS

## 2016-10-21 MED ORDER — ASPIRIN EC 325 MG PO TBEC
325.0000 mg | DELAYED_RELEASE_TABLET | Freq: Every day | ORAL | Status: DC
  Administered 2016-10-21: 325 mg via ORAL
  Filled 2016-10-21: qty 1

## 2016-10-21 MED ORDER — HYDROMORPHONE HCL 1 MG/ML IJ SOLN: 0.2500 mg | INTRAMUSCULAR | Status: DC | PRN

## 2016-10-21 MED ORDER — BISACODYL 10 MG RE SUPP: 10.0000 mg | Freq: Every day | RECTAL | Status: DC | PRN

## 2016-10-21 MED ORDER — SODIUM CHLORIDE 0.9 % IV SOLN: 250.0000 mL | INTRAVENOUS | Status: DC | PRN

## 2016-10-21 NOTE — Progress Notes (Signed)
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 Walking well  Objective: Vital signs in last 24 hours: Temp:  [98.1 F (36.7 C)-99.9 F (37.7 C)] 98.2 F (36.8 C) (09/02 0732) Pulse Rate:  [70-110] 78 (09/02 0800) Cardiac Rhythm: Atrial fibrillation (09/02 0800) Resp:  [17-29] 18 (09/02 0800) BP: (98-128)/(63-89) 105/72 (09/02 0800) SpO2:  [92 %-98 %] 96 % (09/02 0800) Weight:  [87.5 kg (192 lb 14.4 oz)] 87.5 kg (192 lb 14.4 oz) (09/02 0500)  Hemodynamic parameters for last 24 hours:    Intake/Output from previous day: 09/01 0701 - 09/02 0700 In: 720.2 [P.O.:240; I.V.:430.2; IV Piggyback:50] Out: 2542 [Urine:1615] Intake/Output this shift: Total I/O In: 16.7 [I.V.:16.7] Out: -   General appearance: alert and cooperative Neurologic: intact Heart: irregularly irregular rhythm Lungs: clear to auscultation bilaterally Extremities: edema mild Wound: dressings dry  Lab Results:  Recent Labs  10/20/16 0333 10/21/16 0322  WBC 9.2 9.4  HGB 11.3* 11.2*  HCT 33.5* 33.9*  PLT 68* 76*   BMET:  Recent Labs  10/20/16 0333 10/21/16 0322  NA 134* 134*  K 4.1 4.7  CL 100* 100*  CO2 26 26  GLUCOSE 141* 110*  BUN 25* 23*  CREATININE 1.06 0.97  CALCIUM 7.6* 7.8*    PT/INR:  Recent Labs  10/18/16 1404  LABPROT 17.4*  INR 1.43   ABG    Component Value Date/Time   PHART 7.332 (L) 10/18/2016 1946   HCO3 25.4 10/18/2016 1946   TCO2 26 10/19/2016 1741   ACIDBASEDEF 1.0 10/18/2016 1946   O2SAT 70.2 10/20/2016 0350   CBG (last 3)   Recent Labs  10/19/16 0841 10/19/16 1232 10/19/16 2000  GLUCAP 98 101* 176*    Assessment/Plan: S/P Procedure(s) (LRB): 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 Will switch amio to po and continue low dose digoxin for rate control. Hold off on beta blocker for now since BP is only 100-110 and still needs some diuresis. May start low dose Coreg in a day or two. Preop EF 25%, post-bypass 35%. Co-ox today is 70%.  Mild volume excess: wt is 10 lbs over preop. Continue lasix and KCL  Postop atrial fib now rate controlled and intermittent on amio and digoxin. Will start Coumadin since he is still in and out of atrial fib.  Transfer to 4E and continue IS, ambulation.     LOS: 6 days    Gaye Pollack 10/21/2016

## 2016-10-22 LAB — DIGOXIN LEVEL: DIGOXIN LVL: 1.1 ng/mL (ref 0.8–2.0)

## 2016-10-22 LAB — PROTIME-INR
INR: 1.18
Prothrombin Time: 14.9 seconds (ref 11.4–15.2)

## 2016-10-22 MED ORDER — TRAMADOL HCL 50 MG PO TABS: 50.0000 mg | ORAL_TABLET | ORAL | Status: DC | PRN

## 2016-10-22 MED ORDER — ASPIRIN EC 81 MG PO TBEC
81.0000 mg | DELAYED_RELEASE_TABLET | Freq: Every day | ORAL | Status: DC
Start: 2016-10-22 — End: 2016-10-24
  Administered 2016-10-22 – 2016-10-24 (×3): 81 mg via ORAL
  Filled 2016-10-22 (×3): qty 1

## 2016-10-22 MED ORDER — WARFARIN SODIUM 2.5 MG PO TABS
2.5000 mg | ORAL_TABLET | Freq: Once | ORAL | Status: AC
  Administered 2016-10-22: 2.5 mg via ORAL
  Filled 2016-10-22: qty 1

## 2016-10-22 NOTE — Progress Notes (Signed)
Patient ambulated in hallway x1 assist with walked 1400 feet patient slightly short of breath back in room. Pt sitting on side of bed to eat lunch will monitor patient. Cyan Moultrie, Bettina Gavia RN

## 2016-10-22 NOTE — Progress Notes (Addendum)
      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 He is about to eat breakfast. No complaints this am.  Objective: Vital signs in last 24 hours: Temp:  [97.5 F (36.4 C)-99.1 F (37.3 C)] 98.4 F (36.9 C) (09/03 0447) Pulse Rate:  [72-91] 72 (09/03 0447) Cardiac Rhythm: Atrial fibrillation (09/02 1900) Resp:  [17-31] 17 (09/03 0447) BP: (102-141)/(58-102) 102/58 (09/03 0447) SpO2:  [96 %-100 %] 100 % (09/03 0447) Weight:  [188 lb 14.4 oz (85.7 kg)] 188 lb 14.4 oz (85.7 kg) (09/03 0606)  Pre op weight 82.8 kg Current Weight  10/22/16 188 lb 14.4 oz (85.7 kg)      Intake/Output from previous day: 09/02 0701 - 09/03 0700 In: 513.4 [P.O.:480; I.V.:33.4] Out: 250 [Urine:250]   Physical Exam:  Cardiovascular: IRRR IRRR Pulmonary: Diminished at bases. Abdomen: Soft, non tender, bowel sounds present. Extremities: Mild bilateral lower extremity edema. Wounds: Sternal wound is clean and dry.  No erythema or signs of infection. Right thigh dressing is clean and dry  Lab Results: CBC: Recent Labs  10/20/16 0333 10/21/16 0322  WBC 9.2 9.4  HGB 11.3* 11.2*  HCT 33.5* 33.9*  PLT 68* 76*   BMET:  Recent Labs  10/20/16 0333 10/21/16 0322  NA 134* 134*  K 4.1 4.7  CL 100* 100*  CO2 26 26  GLUCOSE 141* 110*  BUN 25* 23*  CREATININE 1.06 0.97  CALCIUM 7.6* 7.8*    PT/INR:  Lab Results  Component Value Date   INR 1.18 10/22/2016   INR 1.43 10/18/2016   INR 1.10 10/16/2016   ABG:  INR: Will add last result for INR, ABG once components are confirmed Will add last 4 CBG results once components are confirmed  Assessment/Plan:  1. CV -  A fib with CVR. On Digoxin 0.125 mg daily, Amiodarone 400 mg bid, and Coumadin .Digoxin level is 1.1. INR 1.18. Will continue with Coumadin 2.5 mg as will not see result of this until am. Likely will not start Coreg at this time secondary to lower HR with above medications. Will decrease ecasa to 81 as is on Coumadin. 2.  Pulmonary - On room air. Encourage incentive spirometer. 3. Volume Overload - On Lasix 40 mg daily 4.  Acute blood loss anemia - H and H yesterday stable at 11.2 and 33.9 5. Thrombocytopenia-platelets yesterday up to 76,000 6. Remove EPW in am 7. Will discharge once able to determine Coumadin dose for a fib  Nasya Vincent MPA-C 10/22/2016,7:28 AM

## 2016-10-22 NOTE — Progress Notes (Signed)
Patient ambulated in hallway with nursing staff. Patient back in room sitting on side of bed. Will monitor patient. Revecca Nachtigal, Bettina Gavia rN

## 2016-10-22 NOTE — Progress Notes (Signed)
Patient and family given exit care documents on seasoning with less salt and heart healthy diet. Will monitor patient. Daleon Willinger, Bettina Gavia rN

## 2016-10-22 NOTE — Discharge Instructions (Signed)
Coronary Artery Bypass Grafting, Care After This sheet gives you information about how to care for yourself after your procedure. Your health care provider may also give you more specific instructions. If you have problems or questions, contact your health care provider. What can I expect after the procedure? After the procedure, it is common to have:  Nausea and a lack of appetite.  Constipation.  Weakness and fatigue.  Depression or irritability.  Pain or discomfort in your incision areas.  Follow these instructions at home: Medicines  Take over-the-counter and prescription medicines only as told by your health care provider. Do not stop taking medicines or start any new medicines without approval from your health care provider.  If you were prescribed an antibiotic medicine, take it as told by your health care provider. Do not stop taking the antibiotic even if you start to feel better.  Do not drive or use heavy machinery while taking prescription pain medicine. Incision care  Follow instructions from your health care provider about how to take care of your incisions. Make sure you: ? Wash your hands with soap and water before you change your bandage (dressing). If soap and water are not available, use hand sanitizer. ? Change your dressing as told by your health care provider. ? Leave stitches (sutures), skin glue, or adhesive strips in place. These skin closures may need to stay in place for 2 weeks or longer. If adhesive strip edges start to loosen and curl up, you may trim the loose edges. Do not remove adhesive strips completely unless your health care provider tells you to do that.  Keep incision areas clean, dry, and protected.  Check your incision areas every day for signs of infection. Check for: ? More redness, swelling, or pain. ? More fluid or blood. ? Warmth. ? Pus or a bad smell.  If incisions were made in your legs: ? Avoid crossing your legs. ? Avoid  sitting for long periods of time. Change positions every 30 minutes. ? Raise (elevate) your legs when you are sitting. Bathing  Do not take baths, swim, or use a hot tub until your health care provider approves.  Only take sponge baths. Pat the incisions dry. Do not rub incisions with a washcloth or towel.  Ask your health care provider when you can shower. Eating and drinking  Eat foods that are high in fiber, such as raw fruits and vegetables, whole grains, beans, and nuts. Meats should be lean cut. Avoid canned, processed, and fried foods. This can help prevent constipation and is a recommended part of a heart-healthy diet.  Drink enough fluid to keep your urine clear or pale yellow.  Limit alcohol intake to no more than 1 drink a day for nonpregnant women and 2 drinks a day for men. One drink equals 12 oz of beer, 5 oz of wine, or 1 oz of hard liquor. Activity  Rest and limit your activity as told by your health care provider. You may be instructed to: ? Stop any activity right away if you have chest pain, shortness of breath, irregular heartbeats, or dizziness. Get help right away if you have any of these symptoms. ? Move around frequently for short periods or take short walks as directed by your health care provider. Gradually increase your activities. You may need physical therapy or cardiac rehabilitation to help strengthen your muscles and build your endurance. ? Avoid lifting, pushing, or pulling anything that is heavier than 10 lb (4.5 kg) for at  least 6 weeks or as told by your health care provider.  Do not drive until your health care provider approves.  Ask your health care provider when you may return to work.  Ask your health care provider when you may resume sexual activity. General instructions  Do not use any products that contain nicotine or tobacco, such as cigarettes and e-cigarettes. If you need help quitting, ask your health care provider.  Take 2-3 deep  breaths every few hours during the day, while you recover. This helps expand your lungs and prevent complications like pneumonia after surgery.  If you were given a device called an incentive spirometer, use it several times a day to practice deep breathing. Support your chest with a pillow or your arms when you take deep breaths or cough.  Wear compression stockings as told by your health care provider. These stockings help to prevent blood clots and reduce swelling in your legs.  Weigh yourself every day. This helps identify if your body is holding (retaining) fluid that may make your heart and lungs work harder.  Keep all follow-up visits as told by your health care provider. This is important. Contact a health care provider if:  You have more redness, swelling, or pain around any incision.  You have more fluid or blood coming from any incision.  Any incision feels warm to the touch.  You have pus or a bad smell coming from any incision  You have a fever.  You have swelling in your ankles or legs.  You have pain in your legs.  You gain 2 lb (0.9 kg) or more a day.  You are nauseous or you vomit.  You have diarrhea. Get help right away if:  You have chest pain that spreads to your jaw or arms.  You are short of breath.  You have a fast or irregular heartbeat.  You notice a "clicking" in your breastbone (sternum) when you move.  You have numbness or weakness in your arms or legs.  You feel dizzy or light-headed. Summary  After the procedure, it is common to have pain or discomfort in the incision areas.  Do not take baths, swim, or use a hot tub until your health care provider approves.  Gradually increase your activities. You may need physical therapy or cardiac rehabilitation to help strengthen your muscles and build your endurance.  Weigh yourself every day. This helps identify if your body is holding (retaining) fluid that may make your heart and lungs work  harder. This information is not intended to replace advice given to you by your health care provider. Make sure you discuss any questions you have with your health care provider. Document Released: 08/25/2004 Document Revised: 12/26/2015 Document Reviewed: 12/26/2015 Elsevier Interactive Patient Education  2018 [place].   Endoscopic Saphenous Vein Harvesting, Care After Refer to this sheet in the next few weeks. These instructions provide you with information about caring for yourself after your procedure. Your health care provider may also give you more specific instructions. Your treatment has been planned according to current medical practices, but problems sometimes occur. Call your health care provider if you have any problems or questions after your procedure. What can I expect after the procedure? After the procedure, it is common to have:  Pain.  Bruising.  Swelling.  Numbness.  Follow these instructions at home: Medicine  Take over-the-counter and prescription medicines only as told by your health care provider.  Do not drive or operate heavy machinery  while taking prescription pain medicine. Incision care   Follow instructions from your health care provider about how to take care of the cut made during surgery (incision). Make sure you: ? Wash your hands with soap and water before you change your bandage (dressing). If soap and water are not available, use hand sanitizer. ? Change your dressing as told by your health care provider. ? Leave stitches (sutures), skin glue, or adhesive strips in place. These skin closures may need to be in place for 2 weeks or longer. If adhesive strip edges start to loosen and curl up, you may trim the loose edges. Do not remove adhesive strips completely unless your health care provider tells you to do that.  Check your incision area every day for signs of infection. Check for: ? More redness, swelling, or pain. ? More fluid or  blood. ? Warmth. ? Pus or a bad smell. General instructions  Raise (elevate) your legs above the level of your heart while you are sitting or lying down.  Do any exercises your health care providers have given you. These may include deep breathing, coughing, and walking exercises.  Do not shower, take baths, swim, or use a hot tub unless told by your health care provider.  Wear your elastic stocking if told by your health care provider.  Keep all follow-up visits as told by your health care provider. This is important. Contact a health care provider if:  Medicine does not help your pain.  Your pain gets worse.  You have new leg bruises or your leg bruises get bigger.  You have a fever.  Your leg feels numb.  You have more redness, swelling, or pain around your incision.  You have more fluid or blood coming from your incision.  Your incision feels warm to the touch.  You have pus or a bad smell coming from your incision. Get help right away if:  Your pain is severe.  You develop pain, tenderness, warmth, redness, or swelling in any part of your leg.  You have chest pain.  You have trouble breathing. This information is not intended to replace advice given to you by your health care provider. Make sure you discuss any questions you have with your health care provider. Document Released: 10/18/2010 Document Revised: 07/14/2015 Document Reviewed: 12/20/2014 Elsevier Interactive Patient Education  2018 [place] on my medicine - Coumadin   (Warfarin)  This medication education was reviewed with me or my healthcare representative as part of my discharge preparation.  The pharmacist that spoke with me during my hospital stay was:  Eulalio, Reamy, [facility]  Why was Coumadin prescribed for you? Coumadin was prescribed for you because you have a blood clot or a medical condition that can cause an increased risk of forming blood clots. Blood clots can cause  serious health problems by blocking the flow of blood to the heart, lung, or brain. Coumadin can prevent harmful blood clots from forming. As a reminder your indication for Coumadin is:   Select from menu  What test will check on my response to Coumadin? While on Coumadin (warfarin) you will need to have an INR test regularly to ensure that your dose is keeping you in the desired range. The INR (international normalized ratio) number is calculated from the result of the laboratory test called prothrombin time (PT).  If an INR APPOINTMENT HAS NOT ALREADY BEEN MADE FOR YOU please schedule an appointment to have this lab work done by  your health care provider within 7 days. Your INR goal is usually a number between:  2 to 3 or your provider may give you a more narrow range like 2-2.5.  Ask your health care provider during an office visit what your goal INR is.  What  do you need to  know  About  COUMADIN? Take Coumadin (warfarin) exactly as prescribed by your healthcare provider about the same time each day.  DO NOT stop taking without talking to the doctor who prescribed the medication.  Stopping without other blood clot prevention medication to take the place of Coumadin may increase your risk of developing a new clot or stroke.  Get refills before you run out.  What do you do if you miss a dose? If you miss a dose, take it as soon as you remember on the same day then continue your regularly scheduled regimen the next day.  Do not take two doses of Coumadin at the same time.  Important Safety Information A possible side effect of Coumadin (Warfarin) is an increased risk of bleeding. You should call your healthcare provider right away if you experience any of the following: ? Bleeding from an injury or your nose that does not stop. ? Unusual colored urine (red or dark brown) or unusual colored stools (red or black). ? Unusual bruising for unknown reasons. ? A serious fall or if you hit your head  (even if there is no bleeding).  Some foods or medicines interact with Coumadin (warfarin) and might alter your response to warfarin. To help avoid this: ? Eat a balanced diet, maintaining a consistent amount of Vitamin K. ? Notify your provider about major diet changes you plan to make. ? Avoid alcohol or limit your intake to 1 drink for women and 2 drinks for men per day. (1 drink is 5 oz. wine, 12 oz. beer, or 1.5 oz. liquor.)  Make sure that ANY health care provider who prescribes medication for you knows that you are taking Coumadin (warfarin).  Also make sure the healthcare provider who is monitoring your Coumadin knows when you have started a new medication including herbals and non-prescription products.  Coumadin (Warfarin)  Major Drug Interactions  Increased Warfarin Effect Decreased Warfarin Effect  Alcohol (large quantities) Antibiotics (esp. Septra/Bactrim, Flagyl, Cipro) Amiodarone (Cordarone) Aspirin (ASA) Cimetidine (Tagamet) Megestrol (Megace) NSAIDs (ibuprofen, naproxen, etc.) Piroxicam (Feldene) Propafenone (Rythmol SR) Propranolol (Inderal) Isoniazid (INH) Posaconazole (Noxafil) Barbiturates (Phenobarbital) Carbamazepine (Tegretol) Chlordiazepoxide (Librium) Cholestyramine (Questran) Griseofulvin Oral Contraceptives Rifampin Sucralfate (Carafate) Vitamin K   Coumadin (Warfarin) Major Herbal Interactions  Increased Warfarin Effect Decreased Warfarin Effect  Garlic Ginseng Ginkgo biloba Coenzyme Q10 Green tea [place] wort    Coumadin (Warfarin) FOOD Interactions  Eat a consistent number of servings per week of foods HIGH in Vitamin K (1 serving =  cup)  Collards (cooked, or boiled & drained) Kale (cooked, or boiled & drained) Mustard greens (cooked, or boiled & drained) Parsley *serving size only =  cup Spinach (cooked, or boiled & drained) Swiss chard (cooked, or boiled & drained) Turnip greens (cooked, or boiled & drained)  Eat a  consistent number of servings per week of foods MEDIUM-HIGH in Vitamin K (1 serving = 1 cup)  Asparagus (cooked, or boiled & drained) Broccoli (cooked, boiled & drained, or raw & chopped) Brussel sprouts (cooked, or boiled & drained) *serving size only =  cup Lettuce, raw (green leaf, endive, romaine) Spinach, raw Turnip greens, raw & chopped   These websites  have more information on Coumadin (warfarin):  FailFactory.se; VeganReport.com.au;

## 2016-10-23 DIAGNOSIS — I480 Paroxysmal atrial fibrillation: Secondary | ICD-10-CM

## 2016-10-23 LAB — BASIC METABOLIC PANEL
ANION GAP: 8 (ref 5–15)
BUN: 20 mg/dL (ref 6–20)
CALCIUM: 8.2 mg/dL — AB (ref 8.9–10.3)
CO2: 29 mmol/L (ref 22–32)
CREATININE: 1.23 mg/dL (ref 0.61–1.24)
Chloride: 98 mmol/L — ABNORMAL LOW (ref 101–111)
GFR, EST NON AFRICAN AMERICAN: 56 mL/min — AB (ref >60)
Glucose, Bld: 157 mg/dL — ABNORMAL HIGH (ref 65–99)
Potassium: 4.3 mmol/L (ref 3.5–5.1)
SODIUM: 135 mmol/L (ref 135–145)

## 2016-10-23 LAB — PROTIME-INR
INR: 1.21
Prothrombin Time: 15.2 seconds (ref 11.4–15.2)

## 2016-10-23 LAB — CBC
HCT: 35.8 % — ABNORMAL LOW (ref 39.0–52.0)
HEMOGLOBIN: 11.4 g/dL — AB (ref 13.0–17.0)
MCH: 29 pg (ref 26.0–34.0)
MCHC: 31.8 g/dL (ref 30.0–36.0)
MCV: 91.1 fL (ref 78.0–100.0)
PLATELETS: 141 10*3/uL — AB (ref 150–400)
RBC: 3.93 MIL/uL — AB (ref 4.22–5.81)
RDW: 14.9 % (ref 11.5–15.5)
WBC: 8.1 10*3/uL (ref 4.0–10.5)

## 2016-10-23 MED ORDER — WARFARIN SODIUM 5 MG PO TABS
5.0000 mg | ORAL_TABLET | Freq: Every day | ORAL | Status: DC
  Administered 2016-10-23: 5 mg via ORAL
  Filled 2016-10-23: qty 1

## 2016-10-23 NOTE — Care Management Important Message (Signed)
Important Message  Patient Details  Name: Jason Day MRN: [id number] Date of Birth: 04-08-1940   Medicare Important Message Given:  Yes    [name] 10/23/2016, 11:06 AM

## 2016-10-23 NOTE — Progress Notes (Signed)
Patient refused to have labs drawn at 05:00. Reordered for 8:00.

## 2016-10-23 NOTE — Progress Notes (Signed)
Progress Note  Patient Name: Jason Day Date of Encounter: 10/23/2016  Primary Cardiologist: Johnsie Cancel  Subjective   Anxious to get home tired of early blood draws  Inpatient Medications    Scheduled Meds: . amiodarone  400 mg Oral BID  . aspirin EC  81 mg Oral Daily  . digoxin  0.125 mg Oral Daily  . furosemide  40 mg Oral Daily  . pantoprazole  40 mg Oral QAC breakfast  . rosuvastatin  10 mg Oral QHS  . sodium chloride flush  3 mL Intravenous Q12H  . warfarin  5 mg Oral q1800  . Warfarin - Physician Dosing Inpatient   Does not apply q1800   Continuous Infusions: . sodium chloride     PRN Meds: sodium chloride, acetaminophen, ondansetron **OR** ondansetron (ZOFRAN) IV, oxyCODONE, sodium chloride flush, traMADol   Vital Signs    Vitals:   10/23/16 1005 10/23/16 1009 10/23/16 1015 10/23/16 1030  BP: 123/81 (!) 158/86 127/78 121/77  Pulse:      Resp: (!) 22 (!) 26 (!) 28 (!) 27  Temp:      TempSrc:      SpO2:      Weight:      Height:       No intake or output data in the 24 hours ending 10/23/16 1220 Filed Weights   10/21/16 0500 10/22/16 0606 10/23/16 0619  Weight: 192 lb 14.4 oz (87.5 kg) 188 lb 14.4 oz (85.7 kg) 185 lb 14.4 oz (84.3 kg)    Telemetry    afib rates 70-80 - Personally Reviewed  ECG    Afib nonspecific ST changes old IMI - Personally Reviewed  Physical Exam  Post sternotomy  GEN: No acute distress.   Neck: No JVD Cardiac: RRR, no murmurs, rubs, or gallops.  Respiratory: Clear to auscultation bilaterally. GI: Soft, nontender, non-distended  MS: No edema; No deformity. Neuro:  Nonfocal  Psych: Normal affect   Labs    Chemistry Recent Labs Lab 10/20/16 0333 10/21/16 0322 10/23/16 0843  NA 134* 134* 135  K 4.1 4.7 4.3  CL 100* 100* 98*  CO2 26 26 29   GLUCOSE 141* 110* 157*  BUN 25* 23* 20  CREATININE 1.06 0.97 1.23  CALCIUM 7.6* 7.8* 8.2*  GFRNONAA >60 >60 56*  GFRAA >60 >60 >60  ANIONGAP 8 8 8      Hematology Recent  Labs Lab 10/20/16 0333 10/21/16 0322 10/23/16 0843  WBC 9.2 9.4 8.1  RBC 3.82* 3.84* 3.93*  HGB 11.3* 11.2* 11.4*  HCT 33.5* 33.9* 35.8*  MCV 87.7 88.3 91.1  MCH 29.6 29.2 29.0  MCHC 33.7 33.0 31.8  RDW 14.7 14.7 14.9  PLT 68* 76* 141*    Cardiac EnzymesNo results for input(s): TROPONINI in the last 168 hours. No results for input(s): TROPIPOC in the last 168 hours.   BNPNo results for input(s): BNP, PROBNP in the last 168 hours.   DDimer No results for input(s): DDIMER in the last 168 hours.   Radiology    No results found.  Cardiac Studies   EF 30-35% Echo  Cath severe LM 3VD with collaterals to total RCA  Patient Profile     76 y.o. 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 Getting coumadin   Assessment & Plan    1) CAD/CABG Sternum healing well no chest pain ASA RCA not bypassed due to cTA and poor PDA vessel 2) CHF will need  cardiac MRI 3 months post CABG on beta blocker and ACE to risk stratify for AICD 3) PAF:  INR sub Rx will follow INR q week in our office decrease amiodarone to 200 bid on d/c will need elective Ladd in 3 weeks if he does not convert on his own  Needs a primary care doctor   Signed, Jenkins Rouge, MD  10/23/2016, 12:20 PM

## 2016-10-23 NOTE — Progress Notes (Signed)
      [address]       [place] [address]             [phone]      5 Days Post-Op Procedure(s) (LRB): CORONARY ARTERY BYPASS GRAFTING (CABG) x five , using left internal mammary artery and right leg greater saphenous vein harvested endoscopically (N/A) TRANSESOPHAGEAL ECHOCARDIOGRAM (TEE) (N/A)   Subjective:  No specific complaints.  Refused labs this morning, requests they are not drawn before 5 am.  He is ambulating.  + BM, diarrhea has improved  Objective: Vital signs in last 24 hours: Temp:  [97.7 F (36.5 C)-99.2 F (37.3 C)] 97.7 F (36.5 C) (09/04 0619) Pulse Rate:  [77-79] 79 (09/03 2051) Cardiac Rhythm: Atrial fibrillation (09/04 0732) Resp:  [18-27] 27 (09/04 0700) BP: (107-130)/(64-87) 124/74 (09/04 0619) SpO2:  [97 %-98 %] 97 % (09/03 2051) Weight:  [185 lb 14.4 oz (84.3 kg)] 185 lb 14.4 oz (84.3 kg) (09/04 0619)  General appearance: alert, cooperative and no distress Heart: regular rate and rhythm Lungs: clear to auscultation bilaterally Abdomen: soft, non-tender; bowel sounds normal; no masses,  no organomegaly Extremities: edema trace Wound: clean and dry  Lab Results:  Recent Labs  10/21/16 0322  WBC 9.4  HGB 11.2*  HCT 33.9*  PLT 76*   BMET:  Recent Labs  10/21/16 0322  NA 134*  K 4.7  CL 100*  CO2 26  GLUCOSE 110*  BUN 23*  CREATININE 0.97  CALCIUM 7.8*    PT/INR:  Recent Labs  10/22/16 0224  LABPROT 14.9  INR 1.18   ABG    Component Value Date/Time   PHART 7.332 (L) 10/18/2016 1946   HCO3 25.4 10/18/2016 1946   TCO2 26 10/19/2016 1741   ACIDBASEDEF 1.0 10/18/2016 1946   O2SAT 70.2 10/20/2016 0350   CBG (last 3)  No results for input(s): GLUCAP in the last 72 hours.  Assessment/Plan: S/P Procedure(s) (LRB): CORONARY ARTERY BYPASS GRAFTING (CABG) x five , using left internal mammary artery and right leg greater saphenous vein harvested endoscopically (N/A) TRANSESOPHAGEAL ECHOCARDIOGRAM (TEE)  (N/A)  1. CV- PAF, NSR currently- continue Amiodarone, Digoxin 2. INR 1.18, PT/INR not drawn today yet, will review and adjust coumadin as necessary 3. Pulm- no acute issues, continue IS 4. Renal- creatinine has been stable, weight is trending down, continue lasix 5. Dispo- patient stable, EPW out today, adjust coumadin dose once INR obtained... Home once INR is trending up and appropriate coumadin dose is obtained   LOS: 8 days    Alias Villagran 10/23/2016

## 2016-10-23 NOTE — Progress Notes (Signed)
EPW d/c'd per order and per protocol. Tips intact. VSS. Pt and wife educated on need for vitals q15 and bedrest x1h. Call bell and phone within reach. Will continue to monitor.

## 2016-10-23 NOTE — Progress Notes (Addendum)
    Needs three month post surgical ECHO/MRI to risk stratify for AICD need. Plan is for patient to go home home INR is trending up and an appropriate coumadin dose is started.   Vitals:   10/23/16 1015 10/23/16 1030  BP: 127/78 121/77  Pulse:    Resp: (!) 28 (!) 27  Temp:    SpO2:     Delos Haring , PA-C 10:44am 10/23/2016

## 2016-10-23 NOTE — Progress Notes (Signed)
CARDIAC REHAB PHASE I   PRE:  Rate/Rhythm: 76 afib    BP: sitting 110/64    SaO2: 96 RA  MODE:  Ambulation: 1780 ft   POST:  Rate/Rhythm: 107 afib    BP: sitting 161/87     SaO2: 100 RA  Pt moved out of bed without difficulty and walked independently. Steady gait, increased distance. He was somewhat breathless but he denied this.  He talked the entire walk. Pt is particular. To recliner, denied c/o. HR maintained low 100s afib. Will f/u [place], ACSM 10/23/2016 9:23 AM

## 2016-10-24 LAB — PROTIME-INR
INR: 1.22
Prothrombin Time: 15.3 seconds — ABNORMAL HIGH (ref 11.4–15.2)

## 2016-10-24 MED ORDER — POTASSIUM CHLORIDE ER 20 MEQ PO TBCR: 20.0000 meq | EXTENDED_RELEASE_TABLET | Freq: Every day | ORAL | 0 refills | Status: DC

## 2016-10-24 MED ORDER — LISINOPRIL 2.5 MG PO TABS: 2.5000 mg | ORAL_TABLET | Freq: Every day | ORAL | Status: DC

## 2016-10-24 MED ORDER — TRAMADOL HCL 50 MG PO TABS: ORAL_TABLET | ORAL | 0 refills | Status: DC

## 2016-10-24 MED ORDER — DIGOXIN 125 MCG PO TABS: 0.1250 mg | ORAL_TABLET | Freq: Every day | ORAL | 1 refills | Status: DC

## 2016-10-24 MED ORDER — AMIODARONE HCL 200 MG PO TABS: 200.0000 mg | ORAL_TABLET | Freq: Two times a day (BID) | ORAL | 1 refills | Status: DC

## 2016-10-24 MED ORDER — ACETAMINOPHEN 325 MG PO TABS: 650.0000 mg | ORAL_TABLET | Freq: Four times a day (QID) | ORAL | Status: DC | PRN

## 2016-10-24 MED ORDER — ROSUVASTATIN CALCIUM 10 MG PO TABS: 10.0000 mg | ORAL_TABLET | Freq: Every day | ORAL | 1 refills | Status: DC

## 2016-10-24 MED ORDER — FUROSEMIDE 40 MG PO TABS
40.0000 mg | ORAL_TABLET | Freq: Every day | ORAL | 0 refills | Status: DC
Start: 2016-10-24 — End: 2016-11-02

## 2016-10-24 MED ORDER — WARFARIN SODIUM 5 MG PO TABS: 5.0000 mg | ORAL_TABLET | Freq: Every day | ORAL | 1 refills | Status: DC

## 2016-10-24 MED ORDER — LISINOPRIL 2.5 MG PO TABS: 2.5000 mg | ORAL_TABLET | Freq: Every day | ORAL | 1 refills | Status: DC

## 2016-10-24 NOTE — Progress Notes (Signed)
Progress Note  Patient Name: Jason Day Date of Encounter: 10/24/2016  Primary Cardiologist:  Johnsie Cancel  Subjective   Says plan is for him to go home today. He is pain free. He has questions about his sodium restrictions, he says he doesn't understand the directions given to him.  Still in rate controlled atrial fibrillation this AM.  Inpatient Medications    Scheduled Meds: . amiodarone  400 mg Oral BID  . aspirin EC  81 mg Oral Daily  . digoxin  0.125 mg Oral Daily  . furosemide  40 mg Oral Daily  . pantoprazole  40 mg Oral QAC breakfast  . rosuvastatin  10 mg Oral QHS  . sodium chloride flush  3 mL Intravenous Q12H  . warfarin  5 mg Oral q1800  . Warfarin - Physician Dosing Inpatient   Does not apply q1800   Continuous Infusions: . sodium chloride     PRN Meds: sodium chloride, acetaminophen, ondansetron **OR** ondansetron (ZOFRAN) IV, oxyCODONE, sodium chloride flush, traMADol   Vital Signs    Vitals:   10/23/16 1950 10/24/16 0500 10/24/16 0722 10/24/16 0724  BP: 105/66   (!) 155/87  Pulse: 74   78  Resp: (!) 28   18  Temp: 98.5 F (36.9 C)  98.6 F (37 C) 98.6 F (37 C)  TempSrc: Oral   Oral  SpO2: 98%   97%  Weight:  185 lb (83.9 kg)    Height:        Intake/Output Summary (Last 24 hours) at 10/24/16 0913 Last data filed at 10/23/16 2117  Gross per 24 hour  Intake              720 ml  Output              250 ml  Net              470 ml   Filed Weights   10/22/16 0606 10/23/16 0619 10/24/16 0500  Weight: 188 lb 14.4 oz (85.7 kg) 185 lb 14.4 oz (84.3 kg) 185 lb (83.9 kg)    Telemetry    Atrial fibrillation, rate controlled.- Personally Reviewed   Physical Exam   GEN: Well nourished, well developed HEENT: normal  Neck: no JVD, carotid bruits, or masses Cardiac: irreg irreg/ well healing sternotomy wound. Respiratory: breathing to baseline. GI: soft, nontender, nondistended, + BS MS: no deformity or atrophy  Skin: warm and dry, no rash Neuro:  Alert and Oriented x 3, Strength and sensation are intact Psych:   Full affect  Labs    Chemistry  Recent Labs Lab 10/20/16 0333 10/21/16 0322 10/23/16 0843  NA 134* 134* 135  K 4.1 4.7 4.3  CL 100* 100* 98*  CO2 26 26 29   GLUCOSE 141* 110* 157*  BUN 25* 23* 20  CREATININE 1.06 0.97 1.23  CALCIUM 7.6* 7.8* 8.2*  GFRNONAA >60 >60 56*  GFRAA >60 >60 >60  ANIONGAP 8 8 8      Hematology  Recent Labs Lab 10/20/16 0333 10/21/16 0322 10/23/16 0843  WBC 9.2 9.4 8.1  RBC 3.82* 3.84* 3.93*  HGB 11.3* 11.2* 11.4*  HCT 33.5* 33.9* 35.8*  MCV 87.7 88.3 91.1  MCH 29.6 29.2 29.0  MCHC 33.7 33.0 31.8  RDW 14.7 14.7 14.9  PLT 68* 76* 141*    Radiology    No results found.  Cardiac Studies   EF 30-35% Echo  Cath severe LM 3VD with collaterals to total RCA  Patient Profile  76 y.o. 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 Getting coumadin   Assessment & Plan    1. CAD/CABG: REcent CABG, RCA not bypassed due to cTA and poor PDA vessel 2. CHF: Per Dr. Johnsie Cancel pt will need cardiac MRI in 3 months post CABG on BB and ACE to risk stratify for AICD. 3. PAF: INR continues to be subtherapeutic, Per Dr. Johnsie Cancel plan is to follow INR each week in cardiology office. Amiodarone to be decreased to 200 mg BID on d/c with plan for elective [place] in 3 weeks if he does not convert on his own. -- still in rate controlled afib this AM.  Patients needs a PCP. Patient says he is to be discharged this morning. Follow-up appointments have been arranged.   Signed, Linus Mako, PA-C  10/24/2016, 9:13 AM    Will try to see if [facility] can see him as he lives in [place] Afib is well rate controlled follow INR in our office plan [employer] 3 weeks if afib remains persistant  Jenkins Rouge

## 2016-10-24 NOTE — Progress Notes (Addendum)
      [address]       [place] [address]             [phone]      6 Days Post-Op Procedure(s) (LRB): CORONARY ARTERY BYPASS GRAFTING (CABG) x five , using left internal mammary artery and right leg greater saphenous vein harvested endoscopically (N/A) TRANSESOPHAGEAL ECHOCARDIOGRAM (TEE) (N/A)   Subjective:  No new complaints.  Had a good night last night.  Up eating breakfast this  Morning.  + BM  Objective: Vital signs in last 24 hours: Temp:  [98.5 F (36.9 C)-98.7 F (37.1 C)] 98.6 F (37 C) (09/05 0724) Pulse Rate:  [74-82] 78 (09/05 0724) Cardiac Rhythm: Atrial fibrillation (09/04 1930) Resp:  [18-28] 18 (09/05 0724) BP: (105-158)/(66-87) 155/87 (09/05 0724) SpO2:  [97 %-98 %] 97 % (09/05 0724) Weight:  [185 lb (83.9 kg)] 185 lb (83.9 kg) (09/05 0500)  Intake/Output from previous day: 09/04 0701 - 09/05 0700 In: 720 [P.O.:720] Out: 250 [Urine:250]  General appearance: alert, cooperative and no distress Heart: regular rate and rhythm Lungs: clear to auscultation bilaterally Abdomen: soft, non-tender; bowel sounds normal; no masses,  no organomegaly Extremities: edema trace Wound: clean and dry  Lab Results:  Recent Labs  10/23/16 0843  WBC 8.1  HGB 11.4*  HCT 35.8*  PLT 141*   BMET:  Recent Labs  10/23/16 0843  NA 135  K 4.3  CL 98*  CO2 29  GLUCOSE 157*  BUN 20  CREATININE 1.23  CALCIUM 8.2*    PT/INR:  Recent Labs  10/24/16 0448  LABPROT 15.3*  INR 1.22   ABG    Component Value Date/Time   PHART 7.332 (L) 10/18/2016 1946   HCO3 25.4 10/18/2016 1946   TCO2 26 10/19/2016 1741   ACIDBASEDEF 1.0 10/18/2016 1946   O2SAT 70.2 10/20/2016 0350   CBG (last 3)  No results for input(s): GLUCAP in the last 72 hours.  Assessment/Plan: S/P Procedure(s) (LRB): CORONARY ARTERY BYPASS GRAFTING (CABG) x five , using left internal mammary artery and right leg greater saphenous vein harvested endoscopically  (N/A) TRANSESOPHAGEAL ECHOCARDIOGRAM (TEE) (N/A)  1. CV- PAF, currently NSR on Amiodarone, Dig, hypertensive at times, will start low dose ACE 2. INR 1.22, minimal response to coumadin, will repeat dose of 5 mg tonight if no response will need to increase dose further 3. Renal- creatinine has been stable, weight is trending down, continue lasix for now 4. Dispo- patient remains stable, in NSR, continue coumadin at 5 mg tonight, will hopefully see a bump and be able to discharge home in AM   LOS: 9 days    BARRETT, ERIN 10/24/2016    Chart reviewed, patient examined, agree with above. He feels fine. I think he can go home today on coumadin 5 mg daily with INR follow up on Friday this week to be sure he is getting therapeutic but not too high since on amiodarone.

## 2016-10-24 NOTE — Progress Notes (Signed)
CARDIAC REHAB PHASE I   Pt up in room independently. Ed completed with good reception. Discussed low sodium diet, daily wts, HF booklet, walking daily, IS, restrictions, and CRPII. Will refer to [place]. [phone]  [place], ACSM 10/24/2016 10:35 AM

## 2016-10-24 NOTE — Care Management Note (Signed)
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 Agency:     Status of Service:  Completed, signed off  If discussed at [employer] of Stay Meetings, dates discussed:    Discharge Disposition: home/self care   Additional Comments:  10/24/16- 0950- Marvetta Gibbons RN, CM- pt given info on both [facility] along with phone # and requested info on [facility] for primary care doctors. No further Cm needs noted for discharge- pt to d/c home today with wife.   10/23/16- 1500- Keveon Amsler RN CM- spoke with pt at bedside regarding referral for PCP needs- per conversation pt states that he is interested in finding a PCP within the [place]- explained to pt [facility] referral line- pt also requested info on [facility] for primary care- will provide pt info on [facility] prior to discharge and other providers for PCP needs that are in [place].   Dawayne Patricia, RN 10/24/2016, 9:51 AM

## 2016-10-26 ENCOUNTER — Ambulatory Visit (INDEPENDENT_AMBULATORY_CARE_PROVIDER_SITE_OTHER): Payer: Medicare Other | Admitting: *Deleted

## 2016-10-26 DIAGNOSIS — Z5181 Encounter for therapeutic drug level monitoring: Secondary | ICD-10-CM | POA: Insufficient documentation

## 2016-10-26 DIAGNOSIS — I4891 Unspecified atrial fibrillation: Secondary | ICD-10-CM

## 2016-10-26 DIAGNOSIS — Z951 Presence of aortocoronary bypass graft: Secondary | ICD-10-CM

## 2016-10-26 LAB — POCT INR: INR: 1.7

## 2016-10-26 NOTE — Patient Instructions (Signed)

## 2016-10-29 ENCOUNTER — Telehealth (HOSPITAL_COMMUNITY): Payer: Self-pay

## 2016-10-29 NOTE — Telephone Encounter (Signed)
Patient insurances are active and benefits verified. Patient insurances are Medicare and [place]  Medicare A/B - no co-payment, deductible $183.00/$183.00 has been met, 20% co-insurance, no out of pocket and no pre-authorization. Passport/reference [phone].   [employer] - no co-payment, no deductible, no out of pocket, no co-insurance and no pre-authorization. Follow medicare guidelines. Passport/reference [phone].  Patient will be contacted and scheduled after their follow up appointment with the cardiologist office on 11/06/16 and surgeon on 11/21/16, upon review by [facility] RN navigator.

## 2016-10-30 ENCOUNTER — Ambulatory Visit (INDEPENDENT_AMBULATORY_CARE_PROVIDER_SITE_OTHER): Payer: Medicare Other

## 2016-10-30 DIAGNOSIS — Z5181 Encounter for therapeutic drug level monitoring: Secondary | ICD-10-CM

## 2016-10-30 DIAGNOSIS — I4891 Unspecified atrial fibrillation: Secondary | ICD-10-CM | POA: Diagnosis not present

## 2016-10-30 DIAGNOSIS — Z951 Presence of aortocoronary bypass graft: Secondary | ICD-10-CM

## 2016-10-30 LAB — POCT INR: INR: 3.3

## 2016-10-30 NOTE — Anesthesia Postprocedure Evaluation (Signed)
Anesthesia Post Note  Patient: Jason Day  Procedure(Day) Performed: Procedure(Day) (LRB): CORONARY ARTERY BYPASS GRAFTING (CABG) x five , using left internal mammary artery and right leg greater saphenous vein harvested endoscopically (N/A) TRANSESOPHAGEAL ECHOCARDIOGRAM (TEE) (N/A)     Patient location during evaluation: SICU Anesthesia Type: General Level of consciousness: sedated Pain management: pain level controlled Vital Signs Assessment: post-procedure vital signs reviewed and stable Respiratory status: patient remains intubated per anesthesia plan Cardiovascular status: stable Anesthetic complications: no    Last Vitals:  Vitals:   10/24/16 0722 10/24/16 0724  BP:  (!) 155/87  Pulse:  78  Resp:  18  Temp: 37 C 37 C  SpO2:  97%    Last Pain:  Vitals:   10/24/16 0724  TempSrc: Oral  PainSc:                  Jason Day

## 2016-11-02 ENCOUNTER — Ambulatory Visit: Payer: Medicare Other | Admitting: Internal Medicine

## 2016-11-02 ENCOUNTER — Encounter: Payer: Self-pay | Admitting: Internal Medicine

## 2016-11-02 ENCOUNTER — Ambulatory Visit (INDEPENDENT_AMBULATORY_CARE_PROVIDER_SITE_OTHER): Payer: Medicare Other | Admitting: Internal Medicine

## 2016-11-02 VITALS — BP 114/58 | HR 66 | Temp 98.1°F | Ht 71.0 in | Wt 192.2 lb

## 2016-11-02 DIAGNOSIS — I2510 Atherosclerotic heart disease of native coronary artery without angina pectoris: Secondary | ICD-10-CM | POA: Diagnosis not present

## 2016-11-02 DIAGNOSIS — R0989 Other specified symptoms and signs involving the circulatory and respiratory systems: Secondary | ICD-10-CM | POA: Diagnosis not present

## 2016-11-02 DIAGNOSIS — I5042 Chronic combined systolic (congestive) and diastolic (congestive) heart failure: Secondary | ICD-10-CM | POA: Insufficient documentation

## 2016-11-02 DIAGNOSIS — I4821 Permanent atrial fibrillation: Secondary | ICD-10-CM | POA: Insufficient documentation

## 2016-11-02 DIAGNOSIS — E785 Hyperlipidemia, unspecified: Secondary | ICD-10-CM | POA: Diagnosis not present

## 2016-11-02 DIAGNOSIS — I4891 Unspecified atrial fibrillation: Secondary | ICD-10-CM | POA: Insufficient documentation

## 2016-11-02 DIAGNOSIS — I25810 Atherosclerosis of coronary artery bypass graft(s) without angina pectoris: Secondary | ICD-10-CM | POA: Insufficient documentation

## 2016-11-02 NOTE — Assessment & Plan Note (Signed)
On statin No apparent side effects Will need labs in next month or so---glucose also (elevated but may not have been fasting)

## 2016-11-02 NOTE — Assessment & Plan Note (Signed)
Asked him to stop the afrin flonase prn

## 2016-11-02 NOTE — Assessment & Plan Note (Signed)
Just had CABG Will be set up for cardiac rehab

## 2016-11-02 NOTE — Assessment & Plan Note (Signed)
Doing well now No sig symptoms Off furosemide and weighing daily Hopefully EF will be higher on recheck

## 2016-11-02 NOTE — Assessment & Plan Note (Signed)
Regular now On amio and coumadin May want to consider NOAC if needs long term

## 2016-11-02 NOTE — Progress Notes (Signed)
Subjective:    Patient ID: Jason Day, male    DOB: May 10, 1940, 76 y.o.   MRN: [id number]  HPI Here to establish care With wife Recently moved from [facility] area---tired of the [employer] around --[place], [place] [address]  Hospitalized due to SOB Found to be in CHF Had CABG Atrial fibrillation also---only since bypass Home for ~ 2 weeks Planned for cardiac rehab in time  No chest pain No SOB No palpitations Slight edema--not necessarily any different from his usual Is weighing himself daily  Current Outpatient Prescriptions on File Prior to Visit  Medication Sig Dispense Refill  . acetaminophen (TYLENOL) 325 MG tablet Take 2 tablets (650 mg total) by mouth every 6 (six) hours as needed for mild pain.    Marland Kitchen albuterol (PROVENTIL HFA;VENTOLIN HFA) 108 (90 Base) MCG/ACT inhaler Inhale 1 puff into the lungs every 6 (six) hours as needed for wheezing or shortness of breath.    Marland Kitchen amiodarone (PACERONE) 200 MG tablet Take 1 tablet (200 mg total) by mouth 2 (two) times daily. For one week then take Amiodarone 200 mg by mouth daily thereafter. 60 tablet 1  . aspirin EC 81 MG tablet Take 81 mg by mouth daily.    . digoxin (LANOXIN) 0.125 MG tablet Take 1 tablet (0.125 mg total) by mouth daily. 30 tablet 1  . diphenhydrAMINE (BENADRYL) 25 MG tablet Take 25 mg by mouth daily as needed for allergies.    . furosemide (LASIX) 40 MG tablet Take 1 tablet (40 mg total) by mouth daily. for 5 days then stop. 5 tablet 0  . lisinopril (PRINIVIL,ZESTRIL) 2.5 MG tablet Take 1 tablet (2.5 mg total) by mouth daily. 30 tablet 1  . potassium chloride 20 MEQ TBCR Take 20 mEq by mouth daily. 5 tablet 0  . rosuvastatin (CRESTOR) 10 MG tablet Take 1 tablet (10 mg total) by mouth at bedtime. 30 tablet 1  . traMADol (ULTRAM) 50 MG tablet Take 50 mg by mouth every 4-6 hours PRN moderate or severe pain. 28 tablet 0  . warfarin (COUMADIN) 5 MG tablet Take 1 tablet (5 mg total) by mouth daily  at 6 PM. Or as directed. 30 tablet 1   No current facility-administered medications on file prior to visit.     No Known Allergies  Past Medical History:  Diagnosis Date  . Atrial fibrillation ([place])   . Chronic combined systolic and diastolic heart failure ([place])   . Chronic sinus complaints    overuses afrin  . Coronary atherosclerosis of native coronary artery     Past Surgical History:  Procedure Laterality Date  . BACK SURGERY  ~2014   disc repair  . CHOLECYSTECTOMY    . CORONARY ARTERY BYPASS GRAFT N/A 10/18/2016   Procedure: CORONARY ARTERY BYPASS GRAFTING (CABG) x five , using left internal mammary artery and right leg greater saphenous vein harvested endoscopically;  Surgeon: Gaye Pollack, MD;  Location: [facility];  Service: Open Heart Surgery;  Laterality: N/A;  . FOOT SURGERY    . RIGHT/LEFT HEART CATH AND CORONARY ANGIOGRAPHY N/A 10/16/2016   Procedure: RIGHT/LEFT HEART CATH AND CORONARY ANGIOGRAPHY;  Surgeon: Belva Crome, MD;  Location: [facility];  Service: Cardiovascular;  Laterality: N/A;  . stents in liver    . TEE WITHOUT CARDIOVERSION N/A 10/18/2016   Procedure: TRANSESOPHAGEAL ECHOCARDIOGRAM (TEE);  Surgeon: Gaye Pollack, MD;  Location: [facility];  Service: Open Heart Surgery;  Laterality: N/A;  Family History  Problem Relation Age of Onset  . COPD Father   . Emphysema Father   . Healthy Brother   . Diabetes Neg Hx   . Cancer Neg Hx     Social History   Social History  . Marital status: Married    Spouse name: N/A  . Number of children: 2  . Years of education: N/A   Occupational History  . [employer]     Retired   Social History Main Topics  . Smoking status: Never Smoker  . Smokeless tobacco: Never Used  . Alcohol use No  . Drug use: No  . Sexual activity: Yes   Other Topics Concern  . Not on file   Social History Narrative   1 son   1 estranged daughter      Has living will   Wife is health care POA--then  son   Would accept resuscitation   No tube feeds if cognitively unaware   Review of Systems  Constitutional: Negative for unexpected weight change.       Mild fatigue in afternoon--takes nap  HENT: Positive for hearing loss. Negative for dental problem.   Respiratory: Negative for cough and shortness of breath.   Cardiovascular: Negative for chest pain, palpitations and leg swelling.  Gastrointestinal:       No heartburn Colonoscopy 2 years ago  Genitourinary:       Nocturia every 2 hours since the hospital No prior problems Not sure he is still taking the furosemide (may have just been for 5 days)  Musculoskeletal: Negative for arthralgias, back pain and joint swelling.  Skin: Negative for rash.  Neurological: Negative for dizziness, syncope and light-headedness.  Psychiatric/Behavioral: Negative for sleep disturbance.       Mild agitation since the hospital and surgery       Objective:   Physical Exam  Constitutional: He appears well-nourished. No distress.  HENT:  Mouth/Throat: Oropharynx is clear and moist. No oropharyngeal exudate.  Neck: No thyromegaly present.  Cardiovascular: Normal rate, regular rhythm and normal heart sounds.  Exam reveals no gallop.   No murmur heard. Feet warm but no palpable pulses  Pulmonary/Chest: Effort normal and breath sounds normal. No respiratory distress. He has no wheezes. He has no rales.  Sternal incision healing well  Abdominal: Soft. He exhibits no distension. There is no tenderness. There is no rebound and no guarding.  Musculoskeletal:  Trace to 1+ edema in ankles  Lymphadenopathy:    He has no cervical adenopathy.  Skin: No erythema.  ?slight stasis changes in calves  Psychiatric: He has a normal mood and affect. His behavior is normal.          Assessment & Plan:

## 2016-11-02 NOTE — Patient Instructions (Signed)
Please stop the afrin. If you have ongoing sinus problems, you can use flonase regularly.   DASH Eating Plan DASH stands for "Dietary Approaches to Stop Hypertension." The DASH eating plan is a healthy eating plan that has been shown to reduce high blood pressure (hypertension). It may also reduce your risk for type 2 diabetes, heart disease, and stroke. The DASH eating plan may also help with weight loss. What are tips for following this plan? General guidelines  Avoid eating more than 2,300 mg (milligrams) of salt (sodium) a day. If you have hypertension, you may need to reduce your sodium intake to 1,500 mg a day.  Limit alcohol intake to no more than 1 drink a day for nonpregnant women and 2 drinks a day for men. One drink equals 12 oz of beer, 5 oz of wine, or 1 oz of hard liquor.  Work with your health care provider to maintain a healthy body weight or to lose weight. Ask what an ideal weight is for you.  Get at least 30 minutes of exercise that causes your heart to beat faster (aerobic exercise) most days of the week. Activities may include walking, swimming, or biking.  Work with your health care provider or diet and nutrition specialist (dietitian) to adjust your eating plan to your individual calorie needs. Reading food labels  Check food labels for the amount of sodium per serving. Choose foods with less than 5 percent of the Daily Value of sodium. Generally, foods with less than 300 mg of sodium per serving fit into this eating plan.  To find whole grains, look for the word "whole" as the first word in the ingredient list. Shopping  Buy products labeled as "low-sodium" or "no salt added."  Buy fresh foods. Avoid canned foods and premade or frozen meals. Cooking  Avoid adding salt when cooking. Use salt-free seasonings or herbs instead of table salt or sea salt. Check with your health care provider or pharmacist before using salt substitutes.  Do not fry foods. Cook foods  using healthy methods such as baking, boiling, grilling, and broiling instead.  Cook with heart-healthy oils, such as olive, canola, soybean, or sunflower oil. Meal planning   Eat a balanced diet that includes: ? 5 or more servings of fruits and vegetables each day. At each meal, try to fill half of your plate with fruits and vegetables. ? Up to 6-8 servings of whole grains each day. ? Less than 6 oz of lean meat, poultry, or fish each day. A 3-oz serving of meat is about the same size as a deck of cards. One egg equals 1 oz. ? 2 servings of low-fat dairy each day. ? A serving of nuts, seeds, or beans 5 times each week. ? Heart-healthy fats. Healthy fats called Omega-3 fatty acids are found in foods such as flaxseeds and coldwater fish, like sardines, salmon, and mackerel.  Limit how much you eat of the following: ? Canned or prepackaged foods. ? Food that is high in trans fat, such as fried foods. ? Food that is high in saturated fat, such as fatty meat. ? Sweets, desserts, sugary drinks, and other foods with added sugar. ? Full-fat dairy products.  Do not salt foods before eating.  Try to eat at least 2 vegetarian meals each week.  Eat more home-cooked food and less restaurant, buffet, and fast food.  When eating at a restaurant, ask that your food be prepared with less salt or no salt, if possible. What foods  are recommended? The items listed may not be a complete list. Talk with your dietitian about what dietary choices are best for you. Grains Whole-grain or whole-wheat bread. Whole-grain or whole-wheat pasta. Brown rice. [place]ena Morrow. Bulgur. Whole-grain and low-sodium cereals. Pita bread. Low-fat, low-sodium crackers. Whole-wheat flour tortillas. Vegetables Fresh or frozen vegetables (raw, steamed, roasted, or grilled). Low-sodium or reduced-sodium tomato and vegetable juice. Low-sodium or reduced-sodium tomato sauce and tomato paste. Low-sodium or reduced-sodium canned  vegetables. Fruits All fresh, dried, or frozen fruit. Canned fruit in natural juice (without added sugar). Meat and other protein foods Skinless chicken or [place]. Ground chicken or [place]. Pork with fat trimmed off. Fish and seafood. Egg whites. Dried beans, peas, or lentils. Unsalted nuts, nut butters, and seeds. Unsalted canned beans. Lean cuts of beef with fat trimmed off. Low-sodium, lean deli meat. Dairy Low-fat (1%) or fat-free (skim) milk. Fat-free, low-fat, or reduced-fat cheeses. Nonfat, low-sodium ricotta or cottage cheese. Low-fat or nonfat yogurt. Low-fat, low-sodium cheese. Fats and oils Soft margarine without trans fats. Vegetable oil. Low-fat, reduced-fat, or light mayonnaise and salad dressings (reduced-sodium). Canola, safflower, olive, soybean, and sunflower oils. Avocado. Seasoning and other foods Herbs. Spices. Seasoning mixes without salt. Unsalted popcorn and pretzels. Fat-free sweets. What foods are not recommended? The items listed may not be a complete list. Talk with your dietitian about what dietary choices are best for you. Grains Baked goods made with fat, such as croissants, muffins, or some breads. Dry pasta or rice meal packs. Vegetables Creamed or fried vegetables. Vegetables in a cheese sauce. Regular canned vegetables (not low-sodium or reduced-sodium). Regular canned tomato sauce and paste (not low-sodium or reduced-sodium). Regular tomato and vegetable juice (not low-sodium or reduced-sodium). Angie Fava. Olives. Fruits Canned fruit in a light or heavy syrup. Fried fruit. Fruit in cream or butter sauce. Meat and other protein foods Fatty cuts of meat. Ribs. Fried meat. Berniece Salines. Sausage. Bologna and other processed lunch meats. Salami. Fatback. Hotdogs. Bratwurst. Salted nuts and seeds. Canned beans with added salt. Canned or smoked fish. Whole eggs or egg yolks. Chicken or [place] with skin. Dairy Whole or 2% milk, cream, and half-and-half. Whole or full-fat  cream cheese. Whole-fat or sweetened yogurt. Full-fat cheese. Nondairy creamers. Whipped toppings. Processed cheese and cheese spreads. Fats and oils Butter. Stick margarine. Lard. Shortening. Ghee. Bacon fat. Tropical oils, such as coconut, palm kernel, or palm oil. Seasoning and other foods Salted popcorn and pretzels. Onion salt, garlic salt, seasoned salt, table salt, and sea salt. Worcestershire sauce. Tartar sauce. Barbecue sauce. Teriyaki sauce. Soy sauce, including reduced-sodium. Steak sauce. Canned and packaged gravies. Fish sauce. Oyster sauce. Cocktail sauce. Horseradish that you find on the shelf. Ketchup. Mustard. Meat flavorings and tenderizers. Bouillon cubes. Hot sauce and Tabasco sauce. Premade or packaged marinades. Premade or packaged taco seasonings. Relishes. Regular salad dressings. Where to find more information:  National Heart, Lung, and [place]: https://wilson-eaton.com/  American Heart Association: www.heart.org Summary  The DASH eating plan is a healthy eating plan that has been shown to reduce high blood pressure (hypertension). It may also reduce your risk for type 2 diabetes, heart disease, and stroke.  With the DASH eating plan, you should limit salt (sodium) intake to 2,300 mg a day. If you have hypertension, you may need to reduce your sodium intake to 1,500 mg a day.  When on the DASH eating plan, aim to eat more fresh fruits and vegetables, whole grains, lean proteins, low-fat dairy, and heart-healthy fats.  Work with your  health care provider or diet and nutrition specialist (dietitian) to adjust your eating plan to your individual calorie needs. This information is not intended to replace advice given to you by your health care provider. Make sure you discuss any questions you have with your health care provider. Document Released: 01/25/2011 Document Revised: 01/30/2016 Document Reviewed: 01/30/2016 Elsevier Interactive Patient Education  2017 [employer].

## 2016-11-06 ENCOUNTER — Ambulatory Visit (INDEPENDENT_AMBULATORY_CARE_PROVIDER_SITE_OTHER): Payer: Medicare Other | Admitting: Nurse Practitioner

## 2016-11-06 ENCOUNTER — Encounter: Payer: Self-pay | Admitting: Nurse Practitioner

## 2016-11-06 ENCOUNTER — Ambulatory Visit (INDEPENDENT_AMBULATORY_CARE_PROVIDER_SITE_OTHER): Payer: Medicare Other | Admitting: *Deleted

## 2016-11-06 VITALS — BP 130/50 | HR 68 | Ht 71.0 in | Wt 191.8 lb

## 2016-11-06 DIAGNOSIS — Z5181 Encounter for therapeutic drug level monitoring: Secondary | ICD-10-CM

## 2016-11-06 DIAGNOSIS — Z79899 Other long term (current) drug therapy: Secondary | ICD-10-CM | POA: Diagnosis not present

## 2016-11-06 DIAGNOSIS — I480 Paroxysmal atrial fibrillation: Secondary | ICD-10-CM | POA: Diagnosis not present

## 2016-11-06 DIAGNOSIS — I4891 Unspecified atrial fibrillation: Secondary | ICD-10-CM | POA: Diagnosis not present

## 2016-11-06 DIAGNOSIS — Z951 Presence of aortocoronary bypass graft: Secondary | ICD-10-CM

## 2016-11-06 DIAGNOSIS — Z23 Encounter for immunization: Secondary | ICD-10-CM | POA: Diagnosis not present

## 2016-11-06 DIAGNOSIS — I2510 Atherosclerotic heart disease of native coronary artery without angina pectoris: Secondary | ICD-10-CM

## 2016-11-06 LAB — PROTIME-INR
INR: 5.7 — ABNORMAL HIGH (ref 0.8–1.2)
Prothrombin Time: 59.9 s — ABNORMAL HIGH (ref 9.1–12.0)

## 2016-11-06 LAB — POCT INR: INR: 7.3

## 2016-11-06 MED ORDER — LISINOPRIL 5 MG PO TABS: 5.0000 mg | ORAL_TABLET | Freq: Every day | ORAL | 3 refills | Status: DC

## 2016-11-06 NOTE — Patient Instructions (Addendum)
We will be checking the following labs today - BMET, dig level and CBC  Also getting stat INR today   Medication Instructions:    Continue with your current medicines. BUT   I am increasing the Lisinopril to 5 mg a day - you can take 2 of the 2.5 mg tablets and use those up - the RX for the 5 mg is at your drug store.   Make sure the medicine list you are given today matches up with what you have at home.     Testing/Procedures To Be Arranged:  N/A  Follow-Up:   See me in about 2 to 3 weeks.     Other Special Instructions:   Daily weight  Let us know if your weight goes up 2 to 3 pounds overnight.   Ok to get a flu shot.     If you need a refill on your cardiac medications before your next appointment, please call your pharmacy.   Call the [place] office at [phone] if you have any questions, problems or concerns.

## 2016-11-06 NOTE — Progress Notes (Addendum)
CARDIOLOGY OFFICE NOTE  Date:  11/06/2016    [name] Date of Birth: [birth year] Medical Record #[id number]  PCP:  Venia Carbon, MD  Cardiologist:  Johnsie Cancel   Chief Complaint  Patient presents with  . Coronary Artery Disease    Post hospital visit - seen for Dr. Johnsie Cancel    History of Present Illness: Jason Day is a 76 y.o. male who presents today for a post hospital visit. Seen for Dr. Johnsie Cancel (NEW).   He recently moved her from [place].  He was followed by a cardiologist there, with most recent stress test being a year ago which was reportedly negative.   He was doing ok  until a fewweeks ago when he developed shortness of breath with exertion.  He went to an urgent care who provided the patient a course of steroids and an inhaler which provided some relief.  However on 8/27 he woke up unable to breath and presented to the ED at [facility].  CXR showed evidence of pulmonary edema and CTA was negative for PE.  He was give IV lasix with improvement in symptoms. Echocardiogram was obtained and showed a reduced EF of 30-35% with diffuse hypokinesis and akineses of the basal inferior and inferoseptal wall.  He was subsequently transferred to [facility] for further care.  He underwent cardiac catheterization which showed significant CAD with LM involvement.  It was felt coronary bypass would be indicated and TCTS consult was requested.  Dr. Cyndia Bent evaluated the patient and was in agreement the patient would require bypass surgery.  He was taken to the operating room on 10/18/2016.  He underwent CABG x 5 utilizing LIMA to LAD, SVG to OM 1, Sequential SVG to OM2 and OM3, and SVG to Diagonal 2.  TEE showed EF of 20 to 25%. He also underwent endoscopic harvest of greater saphenous vein from right leg.  He tolerated the procedure well.  He went into a fib and was put on Amiodarone. Digoxin was then added. He remained in a fib with a controlled ventricular rate. He was started on Coumadin. He had  ABL anemia. He did not require a post op transfusion. Looks like he left in AF.   Comes in today. Here with his wife. Home almost 2 weeks. Needs referral to primary care. Needs handicap sticker. Only here for the past 2 months - got "sick" of [place] lving after 60 years. No real problems since home but they are clearly confused/overwhelmed about medicines/his situation/diet, etc. INR is over 7 today. She thought this was from his eating sausage this morning. He is not able to tell me what his medicines are - does not let his wife oversee. Some popping of the sternum noted. He is not using his pillow but splinting with his hands. He is not short of breath. Minimal swelling in his legs. Not really restricting his salt it sounds like. Not using any pain medicine. No falls. Not driving. Sees Dr. Cyndia Bent in a few weeks.   Past Medical History:  Diagnosis Date  . Atrial fibrillation ([place])   . Chronic combined systolic and diastolic heart failure ([place])   . Chronic sinus complaints    overuses afrin  . Coronary atherosclerosis of native coronary artery   . Hyperlipidemia     Past Surgical History:  Procedure Laterality Date  . BACK SURGERY  ~2014   disc repair  . CHOLECYSTECTOMY    . CORONARY ARTERY BYPASS GRAFT N/A 10/18/2016   Procedure:  CORONARY ARTERY BYPASS GRAFTING (CABG) x five , using left internal mammary artery and right leg greater saphenous vein harvested endoscopically;  Surgeon: Gaye Pollack, MD;  Location: [facility];  Service: Open Heart Surgery;  Laterality: N/A;  . FOOT SURGERY    . RIGHT/LEFT HEART CATH AND CORONARY ANGIOGRAPHY N/A 10/16/2016   Procedure: RIGHT/LEFT HEART CATH AND CORONARY ANGIOGRAPHY;  Surgeon: Belva Crome, MD;  Location: [facility];  Service: Cardiovascular;  Laterality: N/A;  . stents in liver    . TEE WITHOUT CARDIOVERSION N/A 10/18/2016   Procedure: TRANSESOPHAGEAL ECHOCARDIOGRAM (TEE);  Surgeon: Gaye Pollack, MD;  Location: [facility];  Service: Open  Heart Surgery;  Laterality: N/A;     Medications: Current Meds  Medication Sig  . acetaminophen (TYLENOL) 325 MG tablet Take 2 tablets (650 mg total) by mouth every 6 (six) hours as needed for mild pain.  Marland Kitchen amiodarone (PACERONE) 200 MG tablet Take 200 mg by mouth daily.  Marland Kitchen aspirin EC 81 MG tablet Take 81 mg by mouth daily.  . digoxin (LANOXIN) 0.125 MG tablet Take 1 tablet (0.125 mg total) by mouth daily.  . diphenhydrAMINE (BENADRYL) 25 MG tablet Take 25 mg by mouth daily as needed for allergies.  . rosuvastatin (CRESTOR) 10 MG tablet Take 1 tablet (10 mg total) by mouth at bedtime.  Marland Kitchen warfarin (COUMADIN) 5 MG tablet Take 1 tablet (5 mg total) by mouth daily at 6 PM. Or as directed.  . [DISCONTINUED] lisinopril (PRINIVIL,ZESTRIL) 2.5 MG tablet Take 1 tablet (2.5 mg total) by mouth daily.     Allergies: No Known Allergies  Social History: The patient  reports that he has never smoked. He has never used smokeless tobacco. He reports that he does not drink alcohol or use drugs.   Family History: The patient's family history includes COPD in his father; Emphysema in his father; Healthy in his brother.   Review of Systems: Please see the history of present illness.   Otherwise, the review of systems is positive for none.   All other systems are reviewed and negative.   Physical Exam: VS:  BP (!) 130/50   Pulse 68   Ht 5\' 11"  (1.803 m)   Wt 191 lb 12.8 oz (87 kg)   BMI 26.75 kg/m  .  BMI Body mass index is 26.75 kg/m.  Wt Readings from Last 3 Encounters:  11/06/16 191 lb 12.8 oz (87 kg)  11/02/16 192 lb 4 oz (87.2 kg)  10/24/16 185 lb (83.9 kg)    General: Pleasant. Elderly male. Alert and in no acute distress.   HEENT: Normal.  Neck: Supple, no JVD, carotid bruits, or masses noted.  Cardiac: Regular rate and rhythm. No murmurs, rubs, or gallops. Trace edema. Sternum looks ok. Some popping with deep breathing noted.  Respiratory:  Lungs are clear to auscultation  bilaterally with normal work of breathing.  GI: Soft and nontender.  MS: No deformity or atrophy. Gait and ROM intact.  Skin: Warm and dry. Color is normal.  Neuro:  Strength and sensation are intact and no gross focal deficits noted.  Psych: Alert, appropriate and with normal affect.   LABORATORY DATA:  EKG:  EKG is ordered today. This demonstrates NSR - prior inferior MI.  Lab Results  Component Value Date   WBC 8.1 10/23/2016   HGB 11.4 (L) 10/23/2016   HCT 35.8 (L) 10/23/2016   PLT 141 (L) 10/23/2016   GLUCOSE 157 (H) 10/23/2016   CHOL 165  10/15/2016   TRIG 45 10/15/2016   HDL 44 10/15/2016   LDLCALC 112 (H) 10/15/2016   ALT 54 10/16/2016   AST 38 10/16/2016   NA 135 10/23/2016   K 4.3 10/23/2016   CL 98 (L) 10/23/2016   CREATININE 1.23 10/23/2016   BUN 20 10/23/2016   CO2 29 10/23/2016   INR 7.3 11/06/2016   HGBA1C 5.5 10/16/2016   Lab Results  Component Value Date   INR 7.3 11/06/2016   INR 3.3 10/30/2016   INR 1.7 10/26/2016    BNP (last 3 results)  Recent Labs  10/15/16 0752  BNP 350.5*    ProBNP (last 3 results) No results for input(s): PROBNP in the last 8760 hours.   Other Studies Reviewed Today:  Significant Diagnostic Studies: angiography:    Severe, calcific multivessel coronary artery disease.  Ostial and distal left main stenoses greater than 50%. Depending upon view/ In worst projection ostial left main is 75% and distal is 70% obstructed.  99% mid LAD and 90% distal LAD. The apical LAD is totally occluded.  75% ostial first obtuse marginal, 60% mid circumflex, and 50% ostial second obtuse marginal.  Total occlusion of the mid RCA with faint left-to-right collaterals into what appears to be a relatively small PDA and LV branch.  Normal pulmonary artery pressures.  Ischemic cardiomyopathy with normal left ventricular filling pressures and LVEDP of 15 mmHg suggesting excellent therapy of acute on chronic systolic heart  failure.  Treatments: Surgery  1. Median Sternotomy 2. Extracorporeal circulation 3. Coronary artery bypass grafting x 5   Left internal mammary graft to the LAD  SVG to Diagonal 2  SVG to OM1  Sequential SVG to OM2 and OM3   4. Endoscopic vein harvest from the rightleg   TEE Result status: Final result 10/18/2016   Left ventricle: Cavity is moderately dilated. LV systolic function is severely reduced with an EF of 20-25%. Wall motion is abnormal. Basal, inferior wall motion is akinetic and hypokinetic. No thrombus present. No mass present.  Left atrium: Cavity is mildly to moderately dilated.  Aortic valve: The valve is trileaflet. Moderate valve thickening present. Moderate valve calcification present. Mild regurgitation.  Mitral valve: Mild regurgitation.  Right ventricle: Normal cavity size, wall thickness and ejection fraction.  Tricuspid valve: Mild regurgitation. The tricuspid valve regurgitation jet is directed toward septum.  Pulmonic valve: Mild regurgitation.  Pericardium: Small pericardial effusion adjacent to the left ventricle.      Echo Study Conclusions 09/2016  - Left ventricle: The cavity size was moderately dilated. There was   mild concentric hypertrophy. Systolic function was moderately to   severely reduced. The estimated ejection fraction was in the   range of 30% to 35%. Wall motion was normal; there was diffuse   hypokinesis and akinesis of the basal inferior and inferoseptal   walls. Features are consistent with a pseudonormal left   ventricular filling pattern, with concomitant abnormal relaxation   and increased filling pressure (grade 2 diastolic dysfunction).   Doppler parameters are consistent with elevated ventricular   end-diastolic filling pressure. - Aortic valve: Trileaflet; mildly thickened, mildly calcified   leaflets. There was no regurgitation. - Aortic root: The aortic root was normal in size. - Mitral valve:  Calcified annulus. Mildly thickened leaflets .   There was mild regurgitation. - Left atrium: The atrium was moderately dilated. - Right ventricle: Systolic function was normal. - Right atrium: The atrium was moderately dilated. - Tricuspid valve: There was mild regurgitation. -  Pulmonic valve: There was mild regurgitation. - Inferior vena cava: The vessel was normal in size. - Pericardium, extracardiac: A trivial pericardial effusion was   identified posterior to the heart. There was a large left pleural   effusion.    Assessment/Plan:  1. CAD s/p CABG - slow progress but doing ok. Would hold on rehab for now - reconsider on return visit. Needs labs today. Reminded about lifting restrictions. No driving.   2. Post op atrial fib - back in NSR today - remains on amiodarone - unclear as to whether he is just on one pill a day but I suspect he is. Would like to try and add beta blocker in the future. No need for cardioversion. ?wait to reassess EF before stopping?? - for now he is to continue.   3. Chronic anticoagulation - INR over 7 today. Getting venipuncture with further recommendations to follow. Does not seem to understand the interaction of diet/medicines at this time. Will need this information reinforced. Probable 3 month duration of therapy.    4. HLD - I am assuming he is on statin - wife is to double check his medicines.  5. Ischemic CM - EF of 20 to 25 by TEE at time of surgery - discussed need for salt restriction/daily weights,etc. Will need this reinforced. Would plan for repeat echo in 3 months - possible ICD if EF fails to improve.   6. General health maintenance - they do not have PCP - list given today. Ok to get flu shot from the drug store.   Current medicines are reviewed with the patient today.  The patient does not have concerns regarding medicines other than what has been noted above.  The following changes have been made:  See above.  Labs/ tests ordered today  include:    Orders Placed This Encounter  Procedures  . Basic metabolic panel  . CBC  . Digoxin level  . EKG 12-Lead     Disposition:   FU with me in 2 weeks; Dr. Johnsie Cancel in 4 weeks.   Patient is agreeable to this plan and will call if any problems develop in the interim.   SignedTruitt Merle, NP  11/06/2016 12:07 PM  [facility] [address] [place], [place]  [address] Phone: [phone] Fax: [phone]      Addendum: 11/07/16 Noted that Mr. Blandon does have PCP - has seen Dr. Silvio Pate - discussed this over the phone with him today when his labs were called - he says they were talking about getting PCP for his wife - have suggested speaking to Dr. Alla German office to see if this could be arranged.   Burtis Junes, RN, [facility] [address] [place], [place]  [address] [phone]

## 2016-11-07 ENCOUNTER — Other Ambulatory Visit: Payer: Self-pay | Admitting: *Deleted

## 2016-11-07 LAB — BASIC METABOLIC PANEL
BUN/Creatinine Ratio: 15 (ref 10–24)
BUN: 16 mg/dL (ref 8–27)
CO2: 27 mmol/L (ref 20–29)
Calcium: 9 mg/dL (ref 8.6–10.2)
Chloride: 98 mmol/L (ref 96–106)
Creatinine, Ser: 1.05 mg/dL (ref 0.76–1.27)
GFR calc Af Amer: 80 mL/min/{1.73_m2} (ref >59)
GFR calc non Af Amer: 69 mL/min/{1.73_m2} (ref >59)
Glucose: 87 mg/dL (ref 65–99)
Potassium: 5.2 mmol/L (ref 3.5–5.2)
Sodium: 142 mmol/L (ref 134–144)

## 2016-11-07 LAB — CBC
Hematocrit: 37.9 % (ref 37.5–51.0)
Hemoglobin: 12.5 g/dL — ABNORMAL LOW (ref 13.0–17.7)
MCH: 29.3 pg (ref 26.6–33.0)
MCHC: 33 g/dL (ref 31.5–35.7)
MCV: 89 fL (ref 79–97)
Platelets: 262 10*3/uL (ref 150–379)
RBC: 4.27 x10E6/uL (ref 4.14–5.80)
RDW: 14.6 % (ref 12.3–15.4)
WBC: 7.1 10*3/uL (ref 3.4–10.8)

## 2016-11-07 LAB — DIGOXIN LEVEL: Digoxin, Serum: 0.9 ng/mL (ref 0.5–0.9)

## 2016-11-07 MED ORDER — DIGOXIN 125 MCG PO TABS: 0.0625 mg | ORAL_TABLET | Freq: Every day | ORAL | 1 refills | Status: DC

## 2016-11-13 ENCOUNTER — Ambulatory Visit (INDEPENDENT_AMBULATORY_CARE_PROVIDER_SITE_OTHER): Payer: Medicare Other | Admitting: *Deleted

## 2016-11-13 DIAGNOSIS — I4891 Unspecified atrial fibrillation: Secondary | ICD-10-CM | POA: Diagnosis not present

## 2016-11-13 DIAGNOSIS — Z951 Presence of aortocoronary bypass graft: Secondary | ICD-10-CM

## 2016-11-13 DIAGNOSIS — Z5181 Encounter for therapeutic drug level monitoring: Secondary | ICD-10-CM | POA: Diagnosis not present

## 2016-11-13 LAB — POCT INR: INR: 4.4

## 2016-11-20 ENCOUNTER — Other Ambulatory Visit: Payer: Self-pay | Admitting: Surgery

## 2016-11-20 DIAGNOSIS — Z951 Presence of aortocoronary bypass graft: Secondary | ICD-10-CM

## 2016-11-21 ENCOUNTER — Encounter: Payer: Self-pay | Admitting: Nurse Practitioner

## 2016-11-21 ENCOUNTER — Ambulatory Visit
Admission: RE | Admit: 2016-11-21 | Discharge: 2016-11-21 | Disposition: A | Discharge status: Home / Self Care | Payer: Medicare Other | Source: Ambulatory Visit | Attending: Surgery | Admitting: Surgery

## 2016-11-21 ENCOUNTER — Encounter: Payer: Self-pay | Admitting: Surgery

## 2016-11-21 ENCOUNTER — Ambulatory Visit (INDEPENDENT_AMBULATORY_CARE_PROVIDER_SITE_OTHER): Payer: Medicare Other | Admitting: Nurse Practitioner

## 2016-11-21 ENCOUNTER — Other Ambulatory Visit: Payer: Self-pay | Admitting: *Deleted

## 2016-11-21 ENCOUNTER — Encounter (INDEPENDENT_AMBULATORY_CARE_PROVIDER_SITE_OTHER): Payer: Self-pay

## 2016-11-21 ENCOUNTER — Ambulatory Visit (INDEPENDENT_AMBULATORY_CARE_PROVIDER_SITE_OTHER): Payer: Self-pay | Admitting: Surgery

## 2016-11-21 ENCOUNTER — Ambulatory Visit (INDEPENDENT_AMBULATORY_CARE_PROVIDER_SITE_OTHER): Payer: Medicare Other | Admitting: *Deleted

## 2016-11-21 VITALS — BP 114/62 | HR 68 | Ht 71.0 in | Wt 190.0 lb

## 2016-11-21 VITALS — BP 112/58 | HR 68 | Ht 72.0 in | Wt 191.4 lb

## 2016-11-21 DIAGNOSIS — I4891 Unspecified atrial fibrillation: Secondary | ICD-10-CM

## 2016-11-21 DIAGNOSIS — I25810 Atherosclerosis of coronary artery bypass graft(s) without angina pectoris: Secondary | ICD-10-CM | POA: Diagnosis not present

## 2016-11-21 DIAGNOSIS — Z951 Presence of aortocoronary bypass graft: Secondary | ICD-10-CM

## 2016-11-21 DIAGNOSIS — Z5181 Encounter for therapeutic drug level monitoring: Secondary | ICD-10-CM | POA: Diagnosis not present

## 2016-11-21 DIAGNOSIS — I429 Cardiomyopathy, unspecified: Secondary | ICD-10-CM

## 2016-11-21 DIAGNOSIS — I259 Chronic ischemic heart disease, unspecified: Secondary | ICD-10-CM | POA: Diagnosis not present

## 2016-11-21 LAB — POCT INR: INR: 1.6

## 2016-11-21 MED ORDER — WARFARIN SODIUM 5 MG PO TABS: 5.0000 mg | ORAL_TABLET | Freq: Every day | ORAL | 6 refills | Status: DC

## 2016-11-21 MED ORDER — LISINOPRIL 5 MG PO TABS: 5.0000 mg | ORAL_TABLET | Freq: Every day | ORAL | 6 refills | Status: DC

## 2016-11-21 MED ORDER — WARFARIN SODIUM 5 MG PO TABS: 5.0000 mg | ORAL_TABLET | ORAL | 2 refills | Status: DC

## 2016-11-21 MED ORDER — ROSUVASTATIN CALCIUM 10 MG PO TABS: 10.0000 mg | ORAL_TABLET | Freq: Every day | ORAL | 3 refills | Status: DC

## 2016-11-21 MED ORDER — AMIODARONE HCL 200 MG PO TABS: 200.0000 mg | ORAL_TABLET | Freq: Every day | ORAL | 6 refills | Status: DC

## 2016-11-21 MED ORDER — DIGOXIN 125 MCG PO TABS: 0.0625 mg | ORAL_TABLET | Freq: Every day | ORAL | 3 refills | Status: DC

## 2016-11-21 NOTE — Patient Instructions (Addendum)
We will be checking the following labs today - BMET   Medication Instructions:    Continue with your current medicines.   Your refills have been sent in    Testing/Procedures To Be Arranged:  N/A  Follow-Up:   See Dr. Johnsie Cancel as planned    Other Special Instructions:   I sent a message to cardiac rehab.     If you need a refill on your cardiac medications before your next appointment, please call your pharmacy.   Call the [place] office at [phone] if you have any questions, problems or concerns.

## 2016-11-21 NOTE — Progress Notes (Signed)
HPI: Patient returns for routine postoperative follow-up having undergone CABG x 5 on 10/18/2016. The patient's early postoperative recovery while in the hospital was notable for development of postop atrial fibrillation. His rate was controlled with amiodarone and digoxin but he was sent home in atrial fibrillation on Coumadin. He was not started on a beta blocker due to borderline BP and low EF. Since hospital discharge the patient reports that he has been feeling well and has been walking short distances without chest pain or shortness of breath. He saw Truitt Merle, NP a couple weeks ago and his INR was 7. It was checked again a week later and it was 4.4. His most recent INR on 11/21/2016 was 1.6. He is still on 5 mg Coumadin daily.   Current Outpatient Prescriptions  Medication Sig Dispense Refill  . aspirin EC 81 MG tablet Take 81 mg by mouth daily.    Marland Kitchen amiodarone (PACERONE) 200 MG tablet Take 1 tablet (200 mg total) by mouth daily. 30 tablet 6  . digoxin (LANOXIN) 0.125 MG tablet Take 0.5 tablets (0.0625 mg total) by mouth daily. 45 tablet 3  . lisinopril (PRINIVIL,ZESTRIL) 5 MG tablet Take 1 tablet (5 mg total) by mouth daily. 30 tablet 6  . rosuvastatin (CRESTOR) 10 MG tablet Take 1 tablet (10 mg total) by mouth at bedtime. 90 tablet 3  . warfarin (COUMADIN) 5 MG tablet Take 1 tablet (5 mg total) by mouth as directed. Or as directed. 30 tablet 2   No current facility-administered medications for this visit.     Physical Exam: BP 114/62   Pulse 68   Ht 5\' 11"  (1.803 m)   Wt 190 lb (86.2 kg)   SpO2 99%   BMI 26.50 kg/m  He looks well. Lung exam is clear. Cardiac exam shows a regular rate and rhythm with normal heart sounds. Chest incision is healing well and sternum is stable. The leg incisions are healing well and there is no peripheral edema.    Diagnostic Tests:  CLINICAL DATA:  CABG on October 18, 2016.  No current complaints.  EXAM: CHEST  2  VIEW  COMPARISON:  Portable chest x-ray of October 20, 2016  FINDINGS: The lungs are mildly hyperinflated with hemidiaphragm flattening. There is no focal infiltrate. There is no pleural effusion or pneumothorax. The heart is top-normal in size. The pulmonary vascularity is normal. The sternal wires are intact. The retrosternal soft tissues are normal. There is mild multilevel degenerative disc disease of the thoracic spine.  IMPRESSION: Mild hyperinflation which may be voluntary but which could reflect underlying reactive airway disease. Cardiomegaly without pulmonary vascular congestion or pulmonary edema. No pleural effusion or pneumothorax.   Electronically Signed   By: David  Martinique M.D.   On: 11/21/2016 09:04   Impression:  Overall I think he is doing well. His rhythm is regular and I think he is in sinus. He remains on Amiodarone once daily and Coumadin. Will let cardiology decide when to stop these. He says that he needs his medications renewed but I think it would be best for cardiology to see him later this morning before renewing anything in case any changes are needed. I encouraged him to continue walking. He is not planning to participate in cardiac rehab but I encouraged him to do so. I told him he could drive his car but should not lift anything heavier than 10 lbs for three months postop. His preop EF was 25% so I think he  should have a baseline echo at 3 months postop.   Plan:  He will continue to follow up with cardiology and Dr. Silvio Pate. I will be happy to see him if the need arises.   Gaye Pollack, MD [facility] [phone]

## 2016-11-21 NOTE — Progress Notes (Signed)
CARDIOLOGY OFFICE NOTE  Date:  11/21/2016    Jason Day Date of Birth: May 14, 1940 Medical Record #[id number]  PCP:  Venia Carbon, MD  Cardiologist:  Gillian Shields    Chief Complaint  Patient presents with  . Coronary Artery Disease    Follow up visit - seen for Dr. Johnsie Cancel     History of Present Illness: Jason Day is a 76 y.o. male who presents today for a follow up visit. Seen for Dr. Johnsie Cancel.   He recently moved her from [place]. He was followed by a cardiologist there, with most recent stress test being a year ago which was reportedly negative. He was doing ok  until August when he developed shortness of breath with exertion. He went to an urgent care who provided the patient a course of steroids and an inhaler which provided some relief. However on 8/27 he woke up unable to breath and presented to the ED at [facility]. CXR showed evidence of pulmonary edema and CTA was negative for PE. He was give IV lasix with improvement in symptoms. Echocardiogram was obtained and showed a reduced EF of 30-35% with diffuse hypokinesis and akineses of the basal inferior and inferoseptal wall. He was subsequently transferred to [facility] for further care.  He underwent cardiac catheterization which showed significant CAD with LM involvement. It was felt coronary bypass would be indicated and TCTS consult was requested. Dr. Cyndia Bent evaluated the patient and was in agreement the patient would require bypass surgery. He was taken to the operating room on 10/18/2016. He underwent CABG x 5 utilizing LIMA to LAD, SVG to OM 1, Sequential SVG to OM2 and OM3, and SVG to Diagonal 2. TEE showed EF of 20 to 25%. He also underwent endoscopic harvest of greater saphenous vein from right leg. He tolerated the procedure well.  He went into a fib and was put on Amiodarone. Digoxin was then added. He remained in a fib with a controlled ventricular rate. He was started on Coumadin. He had ABL  anemia. He did not require a post op transfusion. Looks like he left in AF.   I saw him about 3 weeks ago for his post hospital visit - quite confused and overwhelmed in a general fashion about his health situation. Making slow progress but clinically felt to be doing ok.   Comes in today. Here with his wife. They really go back and forth with each other during the entire visit. Seen by Dr. Cyndia Bent earlier today - was released per the patient's report. He says he is "fantastic". He brought his medicine bottles in a wine bag. He says he needs refills but also "wants off everything". Walking daily but really not very far or for very long according to his wife. Discussed cardiac rehab - initially he said he would rather not go there - says that is "too much for him". Not dizzy or lightheaded. No syncope. Wife feels like he should be doing more in the way of walking.   Past Medical History:  Diagnosis Date  . Atrial fibrillation ([place])   . Chronic combined systolic and diastolic heart failure ([place])   . Chronic sinus complaints    overuses afrin  . Coronary atherosclerosis of native coronary artery   . Hyperlipidemia     Past Surgical History:  Procedure Laterality Date  . BACK SURGERY  ~2014   disc repair  . CHOLECYSTECTOMY    . CORONARY ARTERY BYPASS GRAFT N/A 10/18/2016  Procedure: CORONARY ARTERY BYPASS GRAFTING (CABG) x five , using left internal mammary artery and right leg greater saphenous vein harvested endoscopically;  Surgeon: Gaye Pollack, MD;  Location: [facility];  Service: Open Heart Surgery;  Laterality: N/A;  . FOOT SURGERY    . RIGHT/LEFT HEART CATH AND CORONARY ANGIOGRAPHY N/A 10/16/2016   Procedure: RIGHT/LEFT HEART CATH AND CORONARY ANGIOGRAPHY;  Surgeon: Belva Crome, MD;  Location: [facility];  Service: Cardiovascular;  Laterality: N/A;  . stents in liver    . TEE WITHOUT CARDIOVERSION N/A 10/18/2016   Procedure: TRANSESOPHAGEAL ECHOCARDIOGRAM (TEE);  Surgeon:  Gaye Pollack, MD;  Location: [facility];  Service: Open Heart Surgery;  Laterality: N/A;     Medications: Current Meds  Medication Sig  . amiodarone (PACERONE) 200 MG tablet Take 1 tablet (200 mg total) by mouth daily.  Marland Kitchen aspirin EC 81 MG tablet Take 81 mg by mouth daily.  . digoxin (LANOXIN) 0.125 MG tablet Take 0.5 tablets (0.0625 mg total) by mouth daily.  Marland Kitchen lisinopril (PRINIVIL,ZESTRIL) 5 MG tablet Take 1 tablet (5 mg total) by mouth daily.  . rosuvastatin (CRESTOR) 10 MG tablet Take 1 tablet (10 mg total) by mouth at bedtime.  . [DISCONTINUED] amiodarone (PACERONE) 200 MG tablet Take 200 mg by mouth daily.  . [DISCONTINUED] digoxin (LANOXIN) 0.125 MG tablet Take 0.5 tablets (0.0625 mg total) by mouth daily.  . [DISCONTINUED] lisinopril (PRINIVIL,ZESTRIL) 5 MG tablet Take 1 tablet (5 mg total) by mouth daily.  . [DISCONTINUED] rosuvastatin (CRESTOR) 10 MG tablet Take 1 tablet (10 mg total) by mouth at bedtime.  . [DISCONTINUED] warfarin (COUMADIN) 5 MG tablet Take 1 tablet (5 mg total) by mouth daily at 6 PM. Or as directed. (Patient taking differently: Take 5 mg by mouth as directed. Taking 2.5 mg daily or as directed)  . [DISCONTINUED] warfarin (COUMADIN) 5 MG tablet Take 1 tablet (5 mg total) by mouth daily at 6 PM. Or as directed.     Allergies: No Known Allergies  Social History: The patient  reports that he has never smoked. He has never used smokeless tobacco. He reports that he does not drink alcohol or use drugs.   Family History: The patient's family history includes COPD in his father; Emphysema in his father; Healthy in his brother.   Review of Systems: Please see the history of present illness.   Otherwise, the review of systems is positive for none.   All other systems are reviewed and negative.   Physical Exam: VS:  BP (!) 112/58 (BP Location: Left Arm, Patient Position: Sitting, Cuff Size: Normal)   Pulse 68   Ht 6' (1.829 m)   Wt 191 lb 6.4 oz (86.8 kg)   BMI  25.96 kg/m  .  BMI Body mass index is 25.96 kg/m.  Wt Readings from Last 3 Encounters:  11/21/16 191 lb 6.4 oz (86.8 kg)  11/21/16 190 lb (86.2 kg)  11/06/16 191 lb 12.8 oz (87 kg)    General: Little argumentative with his wife. Elderly male. Alert and in no acute distress. Weight is stable.   HEENT: Normal.   Neck: Supple, no JVD, carotid bruits, or masses noted.  Cardiac: Regular rate and rhythm. No murmurs, rubs, or gallops. No edema.  Respiratory:  Lungs are clear to auscultation bilaterally with normal work of breathing.  GI: Soft and nontender.  MS: No deformity or atrophy. Gait and ROM intact.  Skin: Warm and dry. Color is normal.  Neuro:  Strength and sensation are intact and no gross focal deficits noted.  Psych: Alert, appropriate and with normal affect.   LABORATORY DATA:  EKG:  EKG is ordered today. This demonstrates NSR - prior inferior MI.  Lab Results  Component Value Date   WBC 7.1 11/06/2016   HGB 12.5 (L) 11/06/2016   HCT 37.9 11/06/2016   PLT 262 11/06/2016   GLUCOSE 87 11/06/2016   CHOL 165 10/15/2016   TRIG 45 10/15/2016   HDL 44 10/15/2016   LDLCALC 112 (H) 10/15/2016   ALT 54 10/16/2016   AST 38 10/16/2016   NA 142 11/06/2016   K 5.2 11/06/2016   CL 98 11/06/2016   CREATININE 1.05 11/06/2016   BUN 16 11/06/2016   CO2 27 11/06/2016   INR 1.6 11/21/2016   HGBA1C 5.5 10/16/2016   Lab Results  Component Value Date   INR 1.6 11/21/2016   INR 4.4 11/13/2016   INR 5.7 (H) 11/06/2016      BNP (last 3 results)  Recent Labs  10/15/16 0752  BNP 350.5*    ProBNP (last 3 results) No results for input(s): PROBNP in the last 8760 hours.   Other Studies Reviewed Today:  Significant Diagnostic Studies:angiography:    Severe, calcific multivessel coronary artery disease.  Ostial and distal left main stenoses greater than 50%. Depending upon view/ In worst projection ostial left main is 75% and distal is 70% obstructed.  99% mid LAD  and 90% distal LAD. The apical LAD is totally occluded.  75% ostial first obtuse marginal, 60% mid circumflex, and 50% ostial second obtuse marginal.  Total occlusion of the mid RCA with faint left-to-right collaterals into what appears to be a relatively small PDA and LV branch.  Normal pulmonary artery pressures.  Ischemic cardiomyopathy with normal left ventricular filling pressures and LVEDP of 15 mmHg suggesting excellent therapy of acute on chronic systolic heart failure.  Treatments:Surgery  1. Median Sternotomy 2. Extracorporeal circulation 3. Coronary artery bypass grafting x 5   Left internal mammary graft to the LAD  SVG to Diagonal 2  SVG to OM1  Sequential SVG to OM2 and OM3   4. Endoscopic vein harvest from the rightleg   TEE Result status: Final result 10/18/2016   Left ventricle: Cavity is moderately dilated. LV systolic function is severely reduced with an EF of 20-25%. Wall motion is abnormal. Basal, inferior wall motion is akinetic and hypokinetic. No thrombus present. No mass present.  Left atrium: Cavity is mildly to moderately dilated.  Aortic valve: The valve is trileaflet. Moderate valve thickening present. Moderate valve calcification present. Mild regurgitation.  Mitral valve: Mild regurgitation.  Right ventricle: Normal cavity size, wall thickness and ejection fraction.  Tricuspid valve: Mild regurgitation. The tricuspid valve regurgitation jet is directed toward septum.  Pulmonic valve: Mild regurgitation.  Pericardium: Small pericardial effusion adjacent to the left ventricle.     Echo Study Conclusions 09/2016  - Left ventricle: The cavity size was moderately dilated. There was mild concentric hypertrophy. Systolic function was moderately to severely reduced. The estimated ejection fraction was in the range of 30% to 35%. Wall motion was normal; there was diffuse hypokinesis and akinesis of the basal inferior  and inferoseptal walls. Features are consistent with a pseudonormal left ventricular filling pattern, with concomitant abnormal relaxation and increased filling pressure (grade 2 diastolic dysfunction). Doppler parameters are consistent with elevated ventricular end-diastolic filling pressure. - Aortic valve: Trileaflet; mildly thickened, mildly calcified leaflets. There was no regurgitation. - Aortic  root: The aortic root was normal in size. - Mitral valve: Calcified annulus. Mildly thickened leaflets . There was mild regurgitation. - Left atrium: The atrium was moderately dilated. - Right ventricle: Systolic function was normal. - Right atrium: The atrium was moderately dilated. - Tricuspid valve: There was mild regurgitation. - Pulmonic valve: There was mild regurgitation. - Inferior vena cava: The vessel was normal in size. - Pericardium, extracardiac: A trivial pericardial effusion was identified posterior to the heart. There was a large left pleural effusion.    Assessment/Plan:  1. CAD s/p CABG - slow progress but doing ok. Got to agree to trying out cardiac rehab. I think this would really be beneficial for him. Released from [facility].   2. Post op atrial fib - remains in NSR today - remains on low dose amiodarone -  Would like to try and add beta blocker in the future. No need for cardioversion. ?wait to reassess EF before stopping?? - I have elected for him to continue for now.    3. Chronic anticoagulation - pretty labile. Probable 3 month duration of therapy.    4. HLD - on Crestor - will need lab on return.  5. Ischemic CM - EF of 20 to 25 by TEE at time of surgery - reminded about need for salt restriction/daily weights,etc. Continues to need this reinforced. Would plan for repeat echo in 3 months - possible ICD if EF fails to improve. He is quite reluctant to take any additional medicines. I wanted to increase his ACE today - but last potassium  was at the higher limits of normal - recheck today.    Current medicines are reviewed with the patient today.  The patient does not have concerns regarding medicines other than what has been noted above.  The following changes have been made:  See above.  Labs/ tests ordered today include:    Orders Placed This Encounter  Procedures  . Basic metabolic panel  . EKG 12-Lead     Disposition:   FU with Dr. Johnsie Cancel as planned later this month.   Patient is agreeable to this plan and will call if any problems develop in the interim.   SignedTruitt Merle, NP  11/21/2016 12:10 PM  [facility] [address] [place], [place]  [address] Phone: [phone] Fax: [phone]

## 2016-11-22 LAB — BASIC METABOLIC PANEL
BUN/Creatinine Ratio: 17 (ref 10–24)
BUN: 19 mg/dL (ref 8–27)
CO2: 25 mmol/L (ref 20–29)
Calcium: 9.1 mg/dL (ref 8.6–10.2)
Chloride: 101 mmol/L (ref 96–106)
Creatinine, Ser: 1.11 mg/dL (ref 0.76–1.27)
GFR calc Af Amer: 75 mL/min/{1.73_m2} (ref >59)
GFR calc non Af Amer: 65 mL/min/{1.73_m2} (ref >59)
Glucose: 90 mg/dL (ref 65–99)
Potassium: 4.3 mmol/L (ref 3.5–5.2)
Sodium: 140 mmol/L (ref 134–144)

## 2016-11-26 ENCOUNTER — Other Ambulatory Visit: Payer: Self-pay | Admitting: *Deleted

## 2016-11-26 DIAGNOSIS — Z5189 Encounter for other specified aftercare: Principal | ICD-10-CM

## 2016-11-26 DIAGNOSIS — Z5181 Encounter for therapeutic drug level monitoring: Secondary | ICD-10-CM

## 2016-11-26 MED ORDER — LISINOPRIL 10 MG PO TABS: 10.0000 mg | ORAL_TABLET | Freq: Every day | ORAL | 6 refills | Status: DC

## 2016-11-27 ENCOUNTER — Encounter (HOSPITAL_COMMUNITY): Payer: Self-pay

## 2016-11-27 ENCOUNTER — Telehealth (HOSPITAL_COMMUNITY): Payer: Self-pay

## 2016-11-27 NOTE — Telephone Encounter (Signed)
I called and left message on voicemail to call office about scheduling for cardiac rehab. I left office contact information on patient voicemail to return call.  ° °

## 2016-12-03 ENCOUNTER — Other Ambulatory Visit: Payer: Medicare Other

## 2016-12-03 ENCOUNTER — Ambulatory Visit (INDEPENDENT_AMBULATORY_CARE_PROVIDER_SITE_OTHER): Payer: Medicare Other

## 2016-12-03 DIAGNOSIS — Z951 Presence of aortocoronary bypass graft: Secondary | ICD-10-CM

## 2016-12-03 DIAGNOSIS — Z5181 Encounter for therapeutic drug level monitoring: Secondary | ICD-10-CM

## 2016-12-03 DIAGNOSIS — I4891 Unspecified atrial fibrillation: Secondary | ICD-10-CM | POA: Diagnosis not present

## 2016-12-03 DIAGNOSIS — Z5189 Encounter for other specified aftercare: Secondary | ICD-10-CM | POA: Diagnosis not present

## 2016-12-03 LAB — BASIC METABOLIC PANEL
BUN/Creatinine Ratio: 15 (ref 10–24)
BUN: 18 mg/dL (ref 8–27)
CO2: 26 mmol/L (ref 20–29)
Calcium: 9.6 mg/dL (ref 8.6–10.2)
Chloride: 99 mmol/L (ref 96–106)
Creatinine, Ser: 1.17 mg/dL (ref 0.76–1.27)
GFR calc Af Amer: 70 mL/min/{1.73_m2} (ref >59)
GFR calc non Af Amer: 61 mL/min/{1.73_m2} (ref >59)
Glucose: 103 mg/dL — ABNORMAL HIGH (ref 65–99)
Potassium: 4.5 mmol/L (ref 3.5–5.2)
Sodium: 142 mmol/L (ref 134–144)

## 2016-12-03 LAB — POCT INR: INR: 2

## 2016-12-04 ENCOUNTER — Telehealth: Payer: Self-pay | Admitting: Nurse Practitioner

## 2016-12-04 NOTE — Telephone Encounter (Signed)
New message      pt is returning [facility] call for lab results

## 2016-12-04 NOTE — Progress Notes (Signed)
CARDIOLOGY OFFICE NOTE  Date:  12/12/2016    [name] Date of Birth: 1940/12/01 Medical Record #[id number]  PCP:  Venia Carbon, MD  Cardiologist:  Andrez Grime chief complaint on file.   History of Present Illness: Jason Day is a 76 y.o. male from [place]. Just moved to [facility] and was hospitalized for afib and CHF 10/15/16 presented with pulmonary edema. EF by echo 30-35% Cath with LM/3VD Had CABG 10/18/16 with LIMA to LAD SVG to OM1, Sequential graft to OM2/OM3 and SVG D1. RCA not bypassed Had native CTO and poor quality PDA for bypass. Post op had afib and d/c on coumadin to consider [facility] in 3-4 weeks seen by NP in f/u and was back in SR Amiodarone continued Will need f/u echo/MRI to assess need for AICD November Going to cardiac rehab      Past Medical History:  Diagnosis Date  . Atrial fibrillation ([place])   . Chronic combined systolic and diastolic heart failure ([place])   . Chronic sinus complaints    overuses afrin  . Coronary atherosclerosis of native coronary artery   . Hyperlipidemia     Past Surgical History:  Procedure Laterality Date  . BACK SURGERY  ~2014   disc repair  . CHOLECYSTECTOMY    . CORONARY ARTERY BYPASS GRAFT N/A 10/18/2016   Procedure: CORONARY ARTERY BYPASS GRAFTING (CABG) x five , using left internal mammary artery and right leg greater saphenous vein harvested endoscopically;  Surgeon: Gaye Pollack, MD;  Location: [place] [facility];  Service: Open Heart Surgery;  Laterality: N/A;  . FOOT SURGERY    . RIGHT/LEFT HEART CATH AND CORONARY ANGIOGRAPHY N/A 10/16/2016   Procedure: RIGHT/LEFT HEART CATH AND CORONARY ANGIOGRAPHY;  Surgeon: Belva Crome, MD;  Location: [place] [facility];  Service: Cardiovascular;  Laterality: N/A;  . stents in liver    . TEE WITHOUT CARDIOVERSION N/A 10/18/2016   Procedure: TRANSESOPHAGEAL ECHOCARDIOGRAM (TEE);  Surgeon: Gaye Pollack, MD;  Location: [place];  Service: Open Heart Surgery;  Laterality: N/A;      Medications: Current Meds  Medication Sig  . amiodarone (PACERONE) 200 MG tablet Take 1 tablet (200 mg total) by mouth daily.  Marland Kitchen aspirin EC 81 MG tablet Take 81 mg by mouth daily.  . digoxin (LANOXIN) 0.125 MG tablet Take 0.5 tablets (0.0625 mg total) by mouth daily.  Marland Kitchen lisinopril (PRINIVIL,ZESTRIL) 10 MG tablet Take 1 tablet (10 mg total) by mouth daily.  . rosuvastatin (CRESTOR) 10 MG tablet Take 1 tablet (10 mg total) by mouth at bedtime.  Marland Kitchen warfarin (COUMADIN) 5 MG tablet Take 1 tablet (5 mg total) by mouth as directed. Or as directed.     Allergies: No Known Allergies  Social History: The patient  reports that he has never smoked. He has never used smokeless tobacco. He reports that he does not drink alcohol or use drugs.   Family History: The patient's family history includes COPD in his father; Emphysema in his father; Healthy in his brother.   Review of Systems: Please see the history of present illness.   Otherwise, the review of systems is positive for none.   All other systems are reviewed and negative.   Physical Exam: VS:  BP 110/70   Pulse 66   Ht 5' 11.5" (1.816 m)   Wt 200 lb 8 oz (90.9 kg)   BMI 27.57 kg/m  .  BMI Body mass index is 27.57 kg/m.  Wt Readings from  Last 3 Encounters:  12/12/16 200 lb 8 oz (90.9 kg)  11/21/16 191 lb 6.4 oz (86.8 kg)  11/21/16 190 lb (86.2 kg)    General: Pleasant. Elderly male. Alert and in no acute distress.   HEENT: Normal.  Neck: Supple, no JVD, carotid bruits, or masses noted.  Cardiac: Regular rate and rhythm. No murmurs, rubs, or gallops. Trace edema. Sternum looks ok. Some popping with deep breathing noted.  Respiratory:  Lungs are clear to auscultation bilaterally with normal work of breathing.  GI: Soft and nontender.  MS: No deformity or atrophy. Gait and ROM intact.  Skin: Warm and dry. Color is normal.  Neuro:  Strength and sensation are intact and no gross focal deficits noted.  Psych: Alert,  appropriate and with normal affect.   LABORATORY DATA:  EKG:  EKG is ordered today. This demonstrates NSR - prior inferior MI.  Lab Results  Component Value Date   WBC 7.1 11/06/2016   HGB 12.5 (L) 11/06/2016   HCT 37.9 11/06/2016   PLT 262 11/06/2016   GLUCOSE 103 (H) 12/03/2016   CHOL 165 10/15/2016   TRIG 45 10/15/2016   HDL 44 10/15/2016   LDLCALC 112 (H) 10/15/2016   ALT 54 10/16/2016   AST 38 10/16/2016   NA 142 12/03/2016   K 4.5 12/03/2016   CL 99 12/03/2016   CREATININE 1.17 12/03/2016   BUN 18 12/03/2016   CO2 26 12/03/2016   INR 2.0 12/03/2016   HGBA1C 5.5 10/16/2016   Lab Results  Component Value Date   INR 2.0 12/03/2016   INR 1.6 11/21/2016   INR 4.4 11/13/2016    BNP (last 3 results)  Recent Labs  10/15/16 0752  BNP 350.5*    ProBNP (last 3 results) No results for input(s): PROBNP in the last 8760 hours.   Other Studies Reviewed Today:  Significant Diagnostic Studies: angiography:    Severe, calcific multivessel coronary artery disease.  Ostial and distal left main stenoses greater than 50%. Depending upon view/ In worst projection ostial left main is 75% and distal is 70% obstructed.  99% mid LAD and 90% distal LAD. The apical LAD is totally occluded.  75% ostial first obtuse marginal, 60% mid circumflex, and 50% ostial second obtuse marginal.  Total occlusion of the mid RCA with faint left-to-right collaterals into what appears to be a relatively small PDA and LV branch.  Normal pulmonary artery pressures.  Ischemic cardiomyopathy with normal left ventricular filling pressures and LVEDP of 15 mmHg suggesting excellent therapy of acute on chronic systolic heart failure.  Treatments: Surgery  1. Median Sternotomy 2. Extracorporeal circulation 3. Coronary artery bypass grafting x 5   Left internal mammary graft to the LAD  SVG to Diagonal 2  SVG to OM1  Sequential SVG to OM2 and OM3   4. Endoscopic vein harvest  from the rightleg   TEE Result status: Final result 10/18/2016   Left ventricle: Cavity is moderately dilated. LV systolic function is severely reduced with an EF of 20-25%. Wall motion is abnormal. Basal, inferior wall motion is akinetic and hypokinetic. No thrombus present. No mass present.  Left atrium: Cavity is mildly to moderately dilated.  Aortic valve: The valve is trileaflet. Moderate valve thickening present. Moderate valve calcification present. Mild regurgitation.  Mitral valve: Mild regurgitation.  Right ventricle: Normal cavity size, wall thickness and ejection fraction.  Tricuspid valve: Mild regurgitation. The tricuspid valve regurgitation jet is directed toward septum.  Pulmonic valve: Mild regurgitation.  Pericardium:  Small pericardial effusion adjacent to the left ventricle.      Echo Study Conclusions 09/2016  - Left ventricle: The cavity size was moderately dilated. There was   mild concentric hypertrophy. Systolic function was moderately to   severely reduced. The estimated ejection fraction was in the   range of 30% to 35%. Wall motion was normal; there was diffuse   hypokinesis and akinesis of the basal inferior and inferoseptal   walls. Features are consistent with a pseudonormal left   ventricular filling pattern, with concomitant abnormal relaxation   and increased filling pressure (grade 2 diastolic dysfunction).   Doppler parameters are consistent with elevated ventricular   end-diastolic filling pressure. - Aortic valve: Trileaflet; mildly thickened, mildly calcified   leaflets. There was no regurgitation. - Aortic root: The aortic root was normal in size. - Mitral valve: Calcified annulus. Mildly thickened leaflets .   There was mild regurgitation. - Left atrium: The atrium was moderately dilated. - Right ventricle: Systolic function was normal. - Right atrium: The atrium was moderately dilated. - Tricuspid valve: There was mild  regurgitation. - Pulmonic valve: There was mild regurgitation. - Inferior vena cava: The vessel was normal in size. - Pericardium, extracardiac: A trivial pericardial effusion was   identified posterior to the heart. There was a large left pleural   effusion.    Assessment/Plan:  1. CAD s/p CABG - going to cardiac rehab will start Toprol 25 mg   2. Post op atrial fib - NSR will stop amiodarone next visit in about 3 months continue coumadin  3. Chronic anticoagulation -will likely stop in 3 months   4. HLD - I am assuming he is on statin - wife is to double check his medicines.  5. Ischemic CM - EF of 20 to 25 by TEE at time of surgery -add beta blocker continue ACE Will get MRI in 3 months to assess need for AICD   [employer]

## 2016-12-05 NOTE — Telephone Encounter (Signed)
Follow up ° ° °Returning your call. °

## 2016-12-10 ENCOUNTER — Telehealth (HOSPITAL_COMMUNITY): Payer: Self-pay | Admitting: Pharmacist

## 2016-12-10 NOTE — Telephone Encounter (Signed)
Cardiac Rehab - Pharmacy Resident Documentation   Patient unable to be reached after three call attempts. Please complete allergy verification and medication review during patient's cardiac rehab appointment.     

## 2016-12-11 ENCOUNTER — Encounter (HOSPITAL_COMMUNITY): Payer: Self-pay

## 2016-12-11 ENCOUNTER — Encounter (HOSPITAL_COMMUNITY)
Admission: RE | Admit: 2016-12-11 | Discharge: 2016-12-11 | Disposition: A | Discharge status: Home / Self Care | Payer: Medicare Other | Source: Ambulatory Visit | Attending: Cardiovascular Disease | Admitting: Cardiovascular Disease

## 2016-12-11 VITALS — BP 117/64 | HR 63 | Ht 71.5 in

## 2016-12-11 DIAGNOSIS — I4891 Unspecified atrial fibrillation: Secondary | ICD-10-CM | POA: Insufficient documentation

## 2016-12-11 DIAGNOSIS — Z79899 Other long term (current) drug therapy: Secondary | ICD-10-CM | POA: Insufficient documentation

## 2016-12-11 DIAGNOSIS — E785 Hyperlipidemia, unspecified: Secondary | ICD-10-CM | POA: Insufficient documentation

## 2016-12-11 DIAGNOSIS — I2510 Atherosclerotic heart disease of native coronary artery without angina pectoris: Secondary | ICD-10-CM | POA: Insufficient documentation

## 2016-12-11 DIAGNOSIS — Z951 Presence of aortocoronary bypass graft: Secondary | ICD-10-CM | POA: Insufficient documentation

## 2016-12-11 DIAGNOSIS — Z7982 Long term (current) use of aspirin: Secondary | ICD-10-CM | POA: Insufficient documentation

## 2016-12-11 DIAGNOSIS — I5042 Chronic combined systolic (congestive) and diastolic (congestive) heart failure: Secondary | ICD-10-CM | POA: Insufficient documentation

## 2016-12-11 DIAGNOSIS — Z7901 Long term (current) use of anticoagulants: Secondary | ICD-10-CM | POA: Insufficient documentation

## 2016-12-11 NOTE — Progress Notes (Signed)
Cardiac Individual Treatment Plan  Patient Details  Name: Jason Day MRN: [id number] Date of Birth: June 05, 1940 Referring Provider:     CARDIAC REHAB PHASE II ORIENTATION from 12/11/2016 in [facility]  Referring Provider  Jenkins Rouge MD      Initial Encounter Date:    CARDIAC REHAB PHASE II ORIENTATION from 12/11/2016 in [facility]  Date  12/11/16  Referring Provider  Jenkins Rouge MD      Visit Diagnosis: S/P CABG x 5  Patient's Home Medications on Admission:  Current Outpatient Prescriptions:  .  amiodarone (PACERONE) 200 MG tablet, Take 1 tablet (200 mg total) by mouth daily., Disp: 30 tablet, Rfl: 6 .  aspirin EC 81 MG tablet, Take 81 mg by mouth daily., Disp: , Rfl:  .  digoxin (LANOXIN) 0.125 MG tablet, Take 0.5 tablets (0.0625 mg total) by mouth daily., Disp: 45 tablet, Rfl: 3 .  lisinopril (PRINIVIL,ZESTRIL) 10 MG tablet, Take 1 tablet (10 mg total) by mouth daily., Disp: 30 tablet, Rfl: 6 .  rosuvastatin (CRESTOR) 10 MG tablet, Take 1 tablet (10 mg total) by mouth at bedtime., Disp: 90 tablet, Rfl: 3 .  warfarin (COUMADIN) 5 MG tablet, Take 1 tablet (5 mg total) by mouth as directed. Or as directed., Disp: 30 tablet, Rfl: 2  Past Medical History: Past Medical History:  Diagnosis Date  . Atrial fibrillation ([place])   . Chronic combined systolic and diastolic heart failure ([place])   . Chronic sinus complaints    overuses afrin  . Coronary atherosclerosis of native coronary artery   . Hyperlipidemia     Tobacco Use: History  Smoking Status  . Never Smoker  Smokeless Tobacco  . Never Used    Labs: Recent Review Flowsheet Data    Labs for ITP Cardiac and Pulmonary Rehab Latest Ref Rng & Units 10/18/2016 10/18/2016 10/18/2016 10/19/2016 10/20/2016   Cholestrol 0 - 200 mg/dL - - - - -   LDLCALC 0 - 99 mg/dL - - - - -   HDL >40 mg/dL - - - - -   Trlycerides <150 mg/dL - - - - -   Hemoglobin A1c 4.8 - 5.6 % -  - - - -   PHART 7.350 - 7.450 7.354 - 7.332(L) - -   PCO2ART 32.0 - 48.0 mmHg 45.8 - 48.3(H) - -   HCO3 20.0 - 28.0 mmol/L 25.5 - 25.4 - -   TCO2 22 - 32 mmol/L 27 24 27 26  -   ACIDBASEDEF 0.0 - 2.0 mmol/L - - 1.0 - -   O2SAT % 95.0 - 93.0 73.0 70.2      Capillary Blood Glucose: Lab Results  Component Value Date   GLUCAP 176 (H) 10/19/2016   GLUCAP 101 (H) 10/19/2016   GLUCAP 98 10/19/2016   GLUCAP 115 (H) 10/19/2016   GLUCAP 134 (H) 10/18/2016     Exercise Target Goals: Date: 12/11/16  Exercise Program Goal: Individual exercise prescription set with THRR, safety & activity barriers. Participant demonstrates ability to understand and report RPE using BORG scale, to self-measure pulse accurately, and to acknowledge the importance of the exercise prescription.  Exercise Prescription Goal: Starting with aerobic activity 30 plus minutes a day, 3 days per week for initial exercise prescription. Provide home exercise prescription and guidelines that participant acknowledges understanding prior to discharge.  Activity Barriers & Risk Stratification:     Activity Barriers & Cardiac Risk Stratification - 12/11/16 1537  Activity Barriers & Cardiac Risk Stratification   Activity Barriers Arthritis;Back Problems;Deconditioning;Muscular Weakness;Other (comment)   Comments B foot discomfort   Cardiac Risk Stratification High      6 Minute Walk:     6 Minute Walk    Row Name 12/11/16 1535         6 Minute Walk   Phase Initial     Distance 1400 feet     Walk Time 6 minutes     # of Rest Breaks 0     MPH 2.65     METS 3.14     RPE 12     VO2 Peak 10.99     Symptoms No     Resting HR 63 bpm     Resting BP 117/64     Resting Oxygen Saturation  99 %     Exercise Oxygen Saturation  during 6 min walk 98 %     Max Ex. HR 103 bpm     Max Ex. BP 160/64        Oxygen Initial Assessment:   Oxygen Re-Evaluation:   Oxygen Discharge (Final Oxygen  Re-Evaluation):   Initial Exercise Prescription:     Initial Exercise Prescription - 12/11/16 1500      Date of Initial Exercise RX and Referring Provider   Date 12/11/16   Referring Provider Jenkins Rouge MD     Bike   Level 0.5   Minutes 10     NuStep   Level 3   SPM 70   Minutes 10   METs 2.5     Track   Laps 12   Minutes 10   METs 3.09     Prescription Details   Frequency (times per week) 3   Duration Progress to 30 minutes of continuous aerobic without signs/symptoms of physical distress     Intensity   THRR 40-80% of Max Heartrate 58-116   Ratings of Perceived Exertion 11-13   Perceived Dyspnea 0-4     Progression   Progression Continue to progress workloads to maintain intensity without signs/symptoms of physical distress.     Resistance Training   Training Prescription Yes   Weight 3lbs   Reps 10-15      Perform Capillary Blood Glucose checks as needed.  Exercise Prescription Changes:   Exercise Comments:   Exercise Goals and Review:     Exercise Goals    Row Name 12/11/16 1405             Exercise Goals   Increase Physical Activity Yes       Intervention Provide advice, education, support and counseling about physical activity/exercise needs.;Develop an individualized exercise prescription for aerobic and resistive training based on initial evaluation findings, risk stratification, comorbidities and participant's personal goals.       Expected Outcomes Achievement of increased cardiorespiratory fitness and enhanced flexibility, muscular endurance and strength shown through measurements of functional capacity and personal statement of participant.       Increase Strength and Stamina Yes       Intervention Provide advice, education, support and counseling about physical activity/exercise needs.;Develop an individualized exercise prescription for aerobic and resistive training based on initial evaluation findings, risk stratification,  comorbidities and participant's personal goals.       Expected Outcomes Achievement of increased cardiorespiratory fitness and enhanced flexibility, muscular endurance and strength shown through measurements of functional capacity and personal statement of participant.       Able to understand and use rate of  perceived exertion (RPE) scale Yes       Intervention Provide education and explanation on how to use RPE scale       Expected Outcomes Short Term: Able to use RPE daily in rehab to express subjective intensity level;Long Term:  Able to use RPE to guide intensity level when exercising independently       Knowledge and understanding of Target Heart Rate Range (THRR) Yes       Intervention Provide education and explanation of THRR including how the numbers were predicted and where they are located for reference       Expected Outcomes Short Term: Able to state/look up THRR;Long Term: Able to use THRR to govern intensity when exercising independently;Short Term: Able to use daily as guideline for intensity in rehab       Able to check pulse independently Yes       Intervention Provide education and demonstration on how to check pulse in carotid and radial arteries.;Review the importance of being able to check your own pulse for safety during independent exercise       Expected Outcomes Short Term: Able to explain why pulse checking is important during independent exercise;Long Term: Able to check pulse independently and accurately       Understanding of Exercise Prescription Yes       Intervention Provide education, explanation, and written materials on patient's individual exercise prescription       Expected Outcomes Short Term: Able to explain program exercise prescription;Long Term: Able to explain home exercise prescription to exercise independently          Exercise Goals Re-Evaluation :    Discharge Exercise Prescription (Final Exercise Prescription Changes):   Nutrition:  Target  Goals: Understanding of nutrition guidelines, daily intake of sodium 1500mg , cholesterol 200mg , calories 30% from fat and 7% or less from saturated fats, daily to have 5 or more servings of fruits and vegetables.  Biometrics:     Pre Biometrics - 12/11/16 1537      Pre Biometrics   Waist Circumference 40.5 inches   Hip Circumference 42 inches   Waist to Hip Ratio 0.96 %   Triceps Skinfold 19 mm   % Body Fat 28.5 %   Grip Strength 34 kg   Flexibility 0 in   Single Leg Stand 23 seconds       Nutrition Therapy Plan and Nutrition Goals:   Nutrition Discharge: Nutrition Scores:   Nutrition Goals Re-Evaluation:   Nutrition Goals Re-Evaluation:   Nutrition Goals Discharge (Final Nutrition Goals Re-Evaluation):   Psychosocial: Target Goals: Acknowledge presence or absence of significant depression and/or stress, maximize coping skills, provide positive support system. Participant is able to verbalize types and ability to use techniques and skills needed for reducing stress and depression.  Initial Review & Psychosocial Screening:     Initial Psych Review & Screening - 12/11/16 1620      Initial Review   Current issues with None Identified     Family Dynamics   Good Support System? Yes     Barriers   Psychosocial barriers to participate in program The patient should benefit from training in stress management and relaxation.     Screening Interventions   Interventions Encouraged to exercise      Quality of Life Scores:     Quality of Life - 12/11/16 1407      Quality of Life Scores   Health/Function Pre 30 %   Socioeconomic Pre 30 %   Psych/Spiritual  Pre 30 %   Family Pre 30 %   GLOBAL Pre 30 %      PHQ-9: Recent Review Flowsheet Data    Depression screen [facility] 2/9 11/02/2016   Decreased Interest 0   Down, Depressed, Hopeless 0   PHQ - 2 Score 0     Interpretation of Total Score  Total Score Depression Severity:  1-4 = Minimal depression, 5-9 =  Mild depression, 10-14 = Moderate depression, 15-19 = Moderately severe depression, 20-27 = Severe depression   Psychosocial Evaluation and Intervention:   Psychosocial Re-Evaluation:   Psychosocial Discharge (Final Psychosocial Re-Evaluation):   Vocational Rehabilitation: Provide vocational rehab assistance to qualifying candidates.   Vocational Rehab Evaluation & Intervention:     Vocational Rehab - 12/11/16 1620      Initial Vocational Rehab Evaluation & Intervention   Assessment shows need for Vocational Rehabilitation No      Education: Education Goals: Education classes will be provided on a weekly basis, covering required topics. Participant will state understanding/return demonstration of topics presented.  Learning Barriers/Preferences:     Learning Barriers/Preferences - 12/11/16 1405      Learning Barriers/Preferences   Learning Barriers Sight   Learning Preferences Skilled Demonstration      Education Topics: Count Your Pulse:  -Group instruction provided by verbal instruction, demonstration, patient participation and written materials to support subject.  Instructors address importance of being able to find your pulse and how to count your pulse when at home without a heart monitor.  Patients get hands on experience counting their pulse with staff help and individually.   Heart Attack, Angina, and Risk Factor Modification:  -Group instruction provided by verbal instruction, video, and written materials to support subject.  Instructors address signs and symptoms of angina and heart attacks.    Also discuss risk factors for heart disease and how to make changes to improve heart health risk factors.   Functional Fitness:  -Group instruction provided by verbal instruction, demonstration, patient participation, and written materials to support subject.  Instructors address safety measures for doing things around the house.  Discuss how to get up and down off  the floor, how to pick things up properly, how to safely get out of a chair without assistance, and balance training.   Meditation and Mindfulness:  -Group instruction provided by verbal instruction, patient participation, and written materials to support subject.  Instructor addresses importance of mindfulness and meditation practice to help reduce stress and improve awareness.  Instructor also leads participants through a meditation exercise.    Stretching for Flexibility and Mobility:  -Group instruction provided by verbal instruction, patient participation, and written materials to support subject.  Instructors lead participants through series of stretches that are designed to increase flexibility thus improving mobility.  These stretches are additional exercise for major muscle groups that are typically performed during regular warm up and cool down.   Hands Only CPR:  -Group verbal, video, and participation provides a basic overview of AHA guidelines for community CPR. Role-play of emergencies allow participants the opportunity to practice calling for help and chest compression technique with discussion of AED use.   Hypertension: -Group verbal and written instruction that provides a basic overview of hypertension including the most recent diagnostic guidelines, risk factor reduction with self-care instructions and medication management.    Nutrition I class: Heart Healthy Eating:  -Group instruction provided by PowerPoint slides, verbal discussion, and written materials to support subject matter. The instructor gives an explanation and review  of the Therapeutic Lifestyle Changes diet recommendations, which includes a discussion on lipid goals, dietary fat, sodium, fiber, plant stanol/sterol esters, sugar, and the components of a well-balanced, healthy diet.   Nutrition II class: Lifestyle Skills:  -Group instruction provided by PowerPoint slides, verbal discussion, and written  materials to support subject matter. The instructor gives an explanation and review of label reading, grocery shopping for heart health, heart healthy recipe modifications, and ways to make healthier choices when eating out.   Diabetes Question & Answer:  -Group instruction provided by PowerPoint slides, verbal discussion, and written materials to support subject matter. The instructor gives an explanation and review of diabetes co-morbidities, pre- and post-prandial blood glucose goals, pre-exercise blood glucose goals, signs, symptoms, and treatment of hypoglycemia and hyperglycemia, and foot care basics.   Diabetes Blitz:  -Group instruction provided by PowerPoint slides, verbal discussion, and written materials to support subject matter. The instructor gives an explanation and review of the physiology behind type 1 and type 2 diabetes, diabetes medications and rational behind using different medications, pre- and post-prandial blood glucose recommendations and Hemoglobin A1c goals, diabetes diet, and exercise including blood glucose guidelines for exercising safely.    Portion Distortion:  -Group instruction provided by PowerPoint slides, verbal discussion, written materials, and food models to support subject matter. The instructor gives an explanation of serving size versus portion size, changes in portions sizes over the last 20 years, and what consists of a serving from each food group.   Stress Management:  -Group instruction provided by verbal instruction, video, and written materials to support subject matter.  Instructors review role of stress in heart disease and how to cope with stress positively.     Exercising on Your Own:  -Group instruction provided by verbal instruction, power point, and written materials to support subject.  Instructors discuss benefits of exercise, components of exercise, frequency and intensity of exercise, and end points for exercise.  Also discuss use of  nitroglycerin and activating EMS.  Review options of places to exercise outside of rehab.  Review guidelines for sex with heart disease.   Cardiac Drugs I:  -Group instruction provided by verbal instruction and written materials to support subject.  Instructor reviews cardiac drug classes: antiplatelets, anticoagulants, beta blockers, and statins.  Instructor discusses reasons, side effects, and lifestyle considerations for each drug class.   Cardiac Drugs II:  -Group instruction provided by verbal instruction and written materials to support subject.  Instructor reviews cardiac drug classes: angiotensin converting enzyme inhibitors (ACE-I), angiotensin II receptor blockers (ARBs), nitrates, and calcium channel blockers.  Instructor discusses reasons, side effects, and lifestyle considerations for each drug class.   Anatomy and Physiology of the Circulatory System:  Group verbal and written instruction and models provide basic cardiac anatomy and physiology, with the coronary electrical and arterial systems. Review of: AMI, Angina, Valve disease, Heart Failure, Peripheral Artery Disease, Cardiac Arrhythmia, Pacemakers, and the ICD.   Other Education:  -Group or individual verbal, written, or video instructions that support the educational goals of the cardiac rehab program.   Knowledge Questionnaire Score:     Knowledge Questionnaire Score - 12/11/16 1406      Knowledge Questionnaire Score   Pre Score N/A- pt states he has no understanding of heart disease      Core Components/Risk Factors/Patient Goals at Admission:     Personal Goals and Risk Factors at Admission - 12/11/16 1538      Core Components/Risk Factors/Patient Goals on Admission  Heart Failure Yes   Intervention Provide a combined exercise and nutrition program that is supplemented with education, support and counseling about heart failure. Directed toward relieving symptoms such as shortness of breath, decreased  exercise tolerance, and extremity edema.   Expected Outcomes Improve functional capacity of life;Short term: Attendance in program 2-3 days a week with increased exercise capacity. Reported lower sodium intake. Reported increased fruit and vegetable intake. Reports medication compliance.;Short term: Daily weights obtained and reported for increase. Utilizing diuretic protocols set by physician.;Long term: Adoption of self-care skills and reduction of barriers for early signs and symptoms recognition and intervention leading to self-care maintenance.   Lipids Yes   Intervention Provide education and support for participant on nutrition & aerobic/resistive exercise along with prescribed medications to achieve LDL 70mg , HDL >40mg .   Expected Outcomes Short Term: Participant states understanding of desired cholesterol values and is compliant with medications prescribed. Participant is following exercise prescription and nutrition guidelines.;Long Term: Cholesterol controlled with medications as prescribed, with individualized exercise RX and with personalized nutrition plan. Value goals: LDL < 70mg , HDL > 40 mg.      Core Components/Risk Factors/Patient Goals Review:    Core Components/Risk Factors/Patient Goals at Discharge (Final Review):    ITP Comments:     ITP Comments    Row Name 12/11/16 1359           ITP Comments Medical Director, Dr. Fransico Him          Comments:  Patient attended orientation from 1330 to 1515 to review rules and guidelines for program. Completed 6 minute walk test, Intitial ITP, and exercise prescription.  VSS. Telemetry-SR with negative QRS with no ectopy this is noted on most recent 12 lead ekg. Pt asymptomatic and tolerated well.  Brief Psychosocial Assessment - Pt with no identifiable barriers to participating in cardiac rehab.  Pt recently moved to [place] from [place].  Pt has a supportive wife.  Pt is looking forward to full exercise next week.  Cherre Huger, BSN Cardiac and Training and development officer

## 2016-12-12 ENCOUNTER — Telehealth: Payer: Self-pay | Admitting: Cardiovascular Disease

## 2016-12-12 ENCOUNTER — Ambulatory Visit (INDEPENDENT_AMBULATORY_CARE_PROVIDER_SITE_OTHER): Payer: Medicare Other | Admitting: Cardiovascular Disease

## 2016-12-12 ENCOUNTER — Encounter: Payer: Self-pay | Admitting: Cardiovascular Disease

## 2016-12-12 VITALS — BP 110/70 | HR 66 | Ht 71.5 in | Wt 200.5 lb

## 2016-12-12 DIAGNOSIS — I25758 Atherosclerosis of native coronary artery of transplanted heart with other forms of angina pectoris: Secondary | ICD-10-CM

## 2016-12-12 DIAGNOSIS — I259 Chronic ischemic heart disease, unspecified: Secondary | ICD-10-CM | POA: Diagnosis not present

## 2016-12-12 DIAGNOSIS — I209 Angina pectoris, unspecified: Secondary | ICD-10-CM | POA: Diagnosis not present

## 2016-12-12 MED ORDER — METOPROLOL SUCCINATE ER 25 MG PO TB24: 25.0000 mg | ORAL_TABLET | Freq: Every day | ORAL | 3 refills | Status: DC

## 2016-12-12 NOTE — Progress Notes (Signed)
Jason Day 76 y.o. male DOB: 1941/02/16 MRN: [id number]      Nutrition Note  1. S/P CABG x 5    Past Medical History:  Diagnosis Date  . Atrial fibrillation ([place])   . Chronic combined systolic and diastolic heart failure ([place])   . Chronic sinus complaints    overuses afrin  . Coronary atherosclerosis of native coronary artery   . Hyperlipidemia    Meds reviewed. Coumadin noted  HT: Ht Readings from Last 1 Encounters:  12/11/16 5' 11.5" (1.816 m)    WT: Wt Readings from Last 3 Encounters:  11/21/16 191 lb 6.4 oz (86.8 kg)  11/21/16 190 lb (86.2 kg)  11/06/16 191 lb 12.8 oz (87 kg)     BMI 26.0   Current tobacco use? No  Labs:  Lipid Panel     Component Value Date/Time   CHOL 165 10/15/2016 1155   TRIG 45 10/15/2016 1155   HDL 44 10/15/2016 1155   CHOLHDL 3.8 10/15/2016 1155   VLDL 9 10/15/2016 1155   LDLCALC 112 (H) 10/15/2016 1155    Lab Results  Component Value Date   HGBA1C 5.5 10/16/2016   CBG (last 3)  No results for input(s): GLUCAP in the last 72 hours.  Nutrition Note Spoke with pt. Nutrition plan and goals reviewed with pt. Pt is following Step 1 of the Therapeutic Lifestyle Changes diet. Pt expressed understanding of the information reviewed. Pt aware of nutrition education classes offered.  Nutrition Diagnosis Food-and nutrition-related knowledge deficit related to lack of exposure to information as related to diagnosis of: ? CVD   Nutrition Intervention ? Pt's individual nutrition plan and goals reviewed with pt.  Nutrition Goal(s):  ? Pt to identify and limit food sources of saturated fat, trans fat, and sodium  Plan:  Pt to attend nutrition classes ? Nutrition I ? Nutrition II ? Portion Distortion  Will provide client-centered nutrition education as part of interdisciplinary care.   Monitor and evaluate progress toward nutrition goal with team.  Derek Mound, M.Ed, RD, LDN, CDE 12/12/2016 8:39 AM

## 2016-12-12 NOTE — Patient Instructions (Signed)
Medication Instructions:  Your physician has recommended you make the following change in your medication:  1-START Toprol 25 mg by mouth daily  Labwork: NONE  Testing/Procedures: Your physician has requested that you have a cardiac MRI in 3 months. Cardiac MRI uses a computer to create images of your heart as its beating, producing both still and moving pictures of your heart and major blood vessels. For further information please visit [email or web address] Please follow the instruction sheet given to you today for more information.  Follow-Up: Your physician wants you to follow-up in: 3 months with Dr. Johnsie Cancel.   If you need a refill on your cardiac medications before your next appointment, please call your pharmacy.

## 2016-12-12 NOTE — Telephone Encounter (Signed)
Called the patient to inquire about time of his cardiac MRI.  He insisted he wanted to have it on 03-18-17 at around 9 a.m.  Message to technician to schedule.

## 2016-12-14 ENCOUNTER — Encounter: Payer: Self-pay | Admitting: Cardiovascular Disease

## 2016-12-17 ENCOUNTER — Other Ambulatory Visit: Payer: Self-pay | Admitting: Physician Assistant

## 2016-12-17 ENCOUNTER — Ambulatory Visit (INDEPENDENT_AMBULATORY_CARE_PROVIDER_SITE_OTHER): Payer: Medicare Other | Admitting: Pharmacist

## 2016-12-17 ENCOUNTER — Other Ambulatory Visit: Payer: Self-pay | Admitting: Cardiovascular Disease

## 2016-12-17 ENCOUNTER — Encounter (HOSPITAL_COMMUNITY)
Admission: RE | Admit: 2016-12-17 | Discharge: 2016-12-17 | Disposition: A | Discharge status: Home / Self Care | Payer: Medicare Other | Source: Ambulatory Visit | Attending: Cardiovascular Disease | Admitting: Cardiovascular Disease

## 2016-12-17 DIAGNOSIS — Z79899 Other long term (current) drug therapy: Secondary | ICD-10-CM | POA: Diagnosis not present

## 2016-12-17 DIAGNOSIS — Z951 Presence of aortocoronary bypass graft: Secondary | ICD-10-CM

## 2016-12-17 DIAGNOSIS — Z5181 Encounter for therapeutic drug level monitoring: Secondary | ICD-10-CM

## 2016-12-17 DIAGNOSIS — E785 Hyperlipidemia, unspecified: Secondary | ICD-10-CM | POA: Diagnosis not present

## 2016-12-17 DIAGNOSIS — I4891 Unspecified atrial fibrillation: Secondary | ICD-10-CM | POA: Diagnosis not present

## 2016-12-17 DIAGNOSIS — I2510 Atherosclerotic heart disease of native coronary artery without angina pectoris: Secondary | ICD-10-CM | POA: Diagnosis not present

## 2016-12-17 DIAGNOSIS — Z7982 Long term (current) use of aspirin: Secondary | ICD-10-CM | POA: Diagnosis not present

## 2016-12-17 DIAGNOSIS — Z7901 Long term (current) use of anticoagulants: Secondary | ICD-10-CM | POA: Diagnosis not present

## 2016-12-17 DIAGNOSIS — I5042 Chronic combined systolic (congestive) and diastolic (congestive) heart failure: Secondary | ICD-10-CM | POA: Diagnosis not present

## 2016-12-17 LAB — POCT INR: INR: 1.9

## 2016-12-17 NOTE — Progress Notes (Signed)
Cardiac Individual Treatment Plan  Patient Details  Name: Jason Day MRN: [id number] Date of Birth: July 25, 1940 Referring Provider:     CARDIAC REHAB PHASE II ORIENTATION from 12/11/2016 in [place]  Referring Provider  Jenkins Rouge MD      Initial Encounter Date:    CARDIAC REHAB PHASE II ORIENTATION from 12/11/2016 in [place]  Date  12/11/16  Referring Provider  Jenkins Rouge MD      Visit Diagnosis: S/P CABG x 5  Patient's Home Medications on Admission:  Current Outpatient Prescriptions:  .  amiodarone (PACERONE) 200 MG tablet, Take 1 tablet (200 mg total) by mouth daily., Disp: 30 tablet, Rfl: 6 .  aspirin EC 81 MG tablet, Take 81 mg by mouth daily., Disp: , Rfl:  .  digoxin (LANOXIN) 0.125 MG tablet, Take 0.5 tablets (0.0625 mg total) by mouth daily., Disp: 45 tablet, Rfl: 3 .  lisinopril (PRINIVIL,ZESTRIL) 10 MG tablet, Take 1 tablet (10 mg total) by mouth daily., Disp: 30 tablet, Rfl: 6 .  metoprolol succinate (TOPROL XL) 25 MG 24 hr tablet, Take 1 tablet (25 mg total) by mouth daily., Disp: 90 tablet, Rfl: 3 .  rosuvastatin (CRESTOR) 10 MG tablet, Take 1 tablet (10 mg total) by mouth at bedtime., Disp: 90 tablet, Rfl: 3 .  warfarin (COUMADIN) 5 MG tablet, Take 1 tablet (5 mg total) by mouth as directed. Or as directed., Disp: 30 tablet, Rfl: 2  Past Medical History: Past Medical History:  Diagnosis Date  . Atrial fibrillation ([place])   . Chronic combined systolic and diastolic heart failure ([place])   . Chronic sinus complaints    overuses afrin  . Coronary atherosclerosis of native coronary artery   . Hyperlipidemia     Tobacco Use: History  Smoking Status  . Never Smoker  Smokeless Tobacco  . Never Used    Labs: Recent Review Flowsheet Data    Labs for ITP Cardiac and Pulmonary Rehab Latest Ref Rng & Units 10/18/2016 10/18/2016 10/18/2016 10/19/2016 10/20/2016   Cholestrol 0 - 200 mg/dL - - - - -    LDLCALC 0 - 99 mg/dL - - - - -   HDL >40 mg/dL - - - - -   Trlycerides <150 mg/dL - - - - -   Hemoglobin A1c 4.8 - 5.6 % - - - - -   PHART 7.350 - 7.450 7.354 - 7.332(L) - -   PCO2ART 32.0 - 48.0 mmHg 45.8 - 48.3(H) - -   HCO3 20.0 - 28.0 mmol/L 25.5 - 25.4 - -   TCO2 22 - 32 mmol/L 27 24 27 26  -   ACIDBASEDEF 0.0 - 2.0 mmol/L - - 1.0 - -   O2SAT % 95.0 - 93.0 73.0 70.2      Capillary Blood Glucose: Lab Results  Component Value Date   GLUCAP 176 (H) 10/19/2016   GLUCAP 101 (H) 10/19/2016   GLUCAP 98 10/19/2016   GLUCAP 115 (H) 10/19/2016   GLUCAP 134 (H) 10/18/2016     Exercise Target Goals:    Exercise Program Goal: Individual exercise prescription set with THRR, safety & activity barriers. Participant demonstrates ability to understand and report RPE using BORG scale, to self-measure pulse accurately, and to acknowledge the importance of the exercise prescription.  Exercise Prescription Goal: Starting with aerobic activity 30 plus minutes a day, 3 days per week for initial exercise prescription. Provide home exercise prescription and guidelines that participant acknowledges understanding prior  to discharge.  Activity Barriers & Risk Stratification:     Activity Barriers & Cardiac Risk Stratification - 12/11/16 1537      Activity Barriers & Cardiac Risk Stratification   Activity Barriers Arthritis;Back Problems;Deconditioning;Muscular Weakness;Other (comment)   Comments B foot discomfort   Cardiac Risk Stratification High      6 Minute Walk:     6 Minute Walk    Row Name 12/11/16 1535         6 Minute Walk   Phase Initial     Distance 1400 feet     Walk Time 6 minutes     # of Rest Breaks 0     MPH 2.65     METS 3.14     RPE 12     VO2 Peak 10.99     Symptoms No     Resting HR 63 bpm     Resting BP 117/64     Resting Oxygen Saturation  99 %     Exercise Oxygen Saturation  during 6 min walk 98 %     Max Ex. HR 103 bpm     Max Ex. BP 160/64         Oxygen Initial Assessment:   Oxygen Re-Evaluation:   Oxygen Discharge (Final Oxygen Re-Evaluation):   Initial Exercise Prescription:     Initial Exercise Prescription - 12/11/16 1500      Date of Initial Exercise RX and Referring Provider   Date 12/11/16   Referring Provider Jenkins Rouge MD     Bike   Level 0.5   Minutes 10     NuStep   Level 3   SPM 70   Minutes 10   METs 2.5     Track   Laps 12   Minutes 10   METs 3.09     Prescription Details   Frequency (times per week) 3   Duration Progress to 30 minutes of continuous aerobic without signs/symptoms of physical distress     Intensity   THRR 40-80% of Max Heartrate 58-116   Ratings of Perceived Exertion 11-13   Perceived Dyspnea 0-4     Progression   Progression Continue to progress workloads to maintain intensity without signs/symptoms of physical distress.     Resistance Training   Training Prescription Yes   Weight 3lbs   Reps 10-15      Perform Capillary Blood Glucose checks as needed.  Exercise Prescription Changes:   Exercise Comments:   Exercise Goals and Review:     Exercise Goals    Row Name 12/11/16 1405             Exercise Goals   Increase Physical Activity Yes       Intervention Provide advice, education, support and counseling about physical activity/exercise needs.;Develop an individualized exercise prescription for aerobic and resistive training based on initial evaluation findings, risk stratification, comorbidities and participant's personal goals.       Expected Outcomes Achievement of increased cardiorespiratory fitness and enhanced flexibility, muscular endurance and strength shown through measurements of functional capacity and personal statement of participant.       Increase Strength and Stamina Yes       Intervention Provide advice, education, support and counseling about physical activity/exercise needs.;Develop an individualized exercise prescription for  aerobic and resistive training based on initial evaluation findings, risk stratification, comorbidities and participant's personal goals.       Expected Outcomes Achievement of increased cardiorespiratory fitness and enhanced flexibility, muscular endurance  and strength shown through measurements of functional capacity and personal statement of participant.       Able to understand and use rate of perceived exertion (RPE) scale Yes       Intervention Provide education and explanation on how to use RPE scale       Expected Outcomes Short Term: Able to use RPE daily in rehab to express subjective intensity level;Long Term:  Able to use RPE to guide intensity level when exercising independently       Knowledge and understanding of Target Heart Rate Range (THRR) Yes       Intervention Provide education and explanation of THRR including how the numbers were predicted and where they are located for reference       Expected Outcomes Short Term: Able to state/look up THRR;Long Term: Able to use THRR to govern intensity when exercising independently;Short Term: Able to use daily as guideline for intensity in rehab       Able to check pulse independently Yes       Intervention Provide education and demonstration on how to check pulse in carotid and radial arteries.;Review the importance of being able to check your own pulse for safety during independent exercise       Expected Outcomes Short Term: Able to explain why pulse checking is important during independent exercise;Long Term: Able to check pulse independently and accurately       Understanding of Exercise Prescription Yes       Intervention Provide education, explanation, and written materials on patient's individual exercise prescription       Expected Outcomes Short Term: Able to explain program exercise prescription;Long Term: Able to explain home exercise prescription to exercise independently          Exercise Goals Re-Evaluation  :    Discharge Exercise Prescription (Final Exercise Prescription Changes):   Nutrition:  Target Goals: Understanding of nutrition guidelines, daily intake of sodium 1500mg , cholesterol 200mg , calories 30% from fat and 7% or less from saturated fats, daily to have 5 or more servings of fruits and vegetables.  Biometrics:     Pre Biometrics - 12/11/16 1537      Pre Biometrics   Waist Circumference 40.5 inches   Hip Circumference 42 inches   Waist to Hip Ratio 0.96 %   Triceps Skinfold 19 mm   % Body Fat 28.5 %   Grip Strength 34 kg   Flexibility 0 in   Single Leg Stand 23 seconds       Nutrition Therapy Plan and Nutrition Goals:     Nutrition Therapy & Goals - 12/12/16 0844      Nutrition Therapy   Diet Therapeutic Lifestyle Change     Personal Nutrition Goals   Nutrition Goal Pt to identify and limit food sources of saturated fat, trans fat, and sodium     Intervention Plan   Intervention Prescribe, educate and counsel regarding individualized specific dietary modifications aiming towards targeted core components such as weight, hypertension, lipid management, diabetes, heart failure and other comorbidities.   Expected Outcomes Short Term Goal: Understand basic principles of dietary content, such as calories, fat, sodium, cholesterol and nutrients.;Long Term Goal: Adherence to prescribed nutrition plan.      Nutrition Discharge: Nutrition Scores:     Nutrition Assessments - 12/12/16 0838      MEDFICTS Scores   Pre Score 50      Nutrition Goals Re-Evaluation:   Nutrition Goals Re-Evaluation:   Nutrition Goals Discharge (Final Nutrition  Goals Re-Evaluation):   Psychosocial: Target Goals: Acknowledge presence or absence of significant depression and/or stress, maximize coping skills, provide positive support system. Participant is able to verbalize types and ability to use techniques and skills needed for reducing stress and depression.  Initial  Review & Psychosocial Screening:     Initial Psych Review & Screening - 12/11/16 1620      Initial Review   Current issues with None Identified     Family Dynamics   Good Support System? Yes     Barriers   Psychosocial barriers to participate in program The patient should benefit from training in stress management and relaxation.     Screening Interventions   Interventions Encouraged to exercise      Quality of Life Scores:     Quality of Life - 12/11/16 1407      Quality of Life Scores   Health/Function Pre 30 %   Socioeconomic Pre 30 %   Psych/Spiritual Pre 30 %   Family Pre 30 %   GLOBAL Pre 30 %      PHQ-9: Recent Review Flowsheet Data    Depression screen [facility] 2/9 12/17/2016 11/02/2016   Decreased Interest 0 0   Down, Depressed, Hopeless 0 0   PHQ - 2 Score 0 0     Interpretation of Total Score  Total Score Depression Severity:  1-4 = Minimal depression, 5-9 = Mild depression, 10-14 = Moderate depression, 15-19 = Moderately severe depression, 20-27 = Severe depression   Psychosocial Evaluation and Intervention:     Psychosocial Evaluation - 12/17/16 1315      Psychosocial Evaluation & Interventions   Interventions Encouraged to exercise with the program and follow exercise prescription;Stress management education;Relaxation education   Comments Pt has no psychosocial needs at this time, pt feels supported in his rehab participation   Expected Outcomes Pt will continue to display positive and healthy coping skills.   Continue Psychosocial Services  No Follow up required      Psychosocial Re-Evaluation:   Psychosocial Discharge (Final Psychosocial Re-Evaluation):   Vocational Rehabilitation: Provide vocational rehab assistance to qualifying candidates.   Vocational Rehab Evaluation & Intervention:     Vocational Rehab - 12/11/16 1620      Initial Vocational Rehab Evaluation & Intervention   Assessment shows need for Vocational Rehabilitation  No      Education: Education Goals: Education classes will be provided on a weekly basis, covering required topics. Participant will state understanding/return demonstration of topics presented.  Learning Barriers/Preferences:     Learning Barriers/Preferences - 12/11/16 1405      Learning Barriers/Preferences   Learning Barriers Sight   Learning Preferences Skilled Demonstration      Education Topics: Count Your Pulse:  -Group instruction provided by verbal instruction, demonstration, patient participation and written materials to support subject.  Instructors address importance of being able to find your pulse and how to count your pulse when at home without a heart monitor.  Patients get hands on experience counting their pulse with staff help and individually.   Heart Attack, Angina, and Risk Factor Modification:  -Group instruction provided by verbal instruction, video, and written materials to support subject.  Instructors address signs and symptoms of angina and heart attacks.    Also discuss risk factors for heart disease and how to make changes to improve heart health risk factors.   Functional Fitness:  -Group instruction provided by verbal instruction, demonstration, patient participation, and written materials to support subject.  Instructors address  safety measures for doing things around the house.  Discuss how to get up and down off the floor, how to pick things up properly, how to safely get out of a chair without assistance, and balance training.   Meditation and Mindfulness:  -Group instruction provided by verbal instruction, patient participation, and written materials to support subject.  Instructor addresses importance of mindfulness and meditation practice to help reduce stress and improve awareness.  Instructor also leads participants through a meditation exercise.    Stretching for Flexibility and Mobility:  -Group instruction provided by verbal  instruction, patient participation, and written materials to support subject.  Instructors lead participants through series of stretches that are designed to increase flexibility thus improving mobility.  These stretches are additional exercise for major muscle groups that are typically performed during regular warm up and cool down.   Hands Only CPR:  -Group verbal, video, and participation provides a basic overview of AHA guidelines for community CPR. Role-play of emergencies allow participants the opportunity to practice calling for help and chest compression technique with discussion of AED use.   Hypertension: -Group verbal and written instruction that provides a basic overview of hypertension including the most recent diagnostic guidelines, risk factor reduction with self-care instructions and medication management.    Nutrition I class: Heart Healthy Eating:  -Group instruction provided by PowerPoint slides, verbal discussion, and written materials to support subject matter. The instructor gives an explanation and review of the Therapeutic Lifestyle Changes diet recommendations, which includes a discussion on lipid goals, dietary fat, sodium, fiber, plant stanol/sterol esters, sugar, and the components of a well-balanced, healthy diet.   Nutrition II class: Lifestyle Skills:  -Group instruction provided by PowerPoint slides, verbal discussion, and written materials to support subject matter. The instructor gives an explanation and review of label reading, grocery shopping for heart health, heart healthy recipe modifications, and ways to make healthier choices when eating out.   Diabetes Question & Answer:  -Group instruction provided by PowerPoint slides, verbal discussion, and written materials to support subject matter. The instructor gives an explanation and review of diabetes co-morbidities, pre- and post-prandial blood glucose goals, pre-exercise blood glucose goals, signs, symptoms,  and treatment of hypoglycemia and hyperglycemia, and foot care basics.   Diabetes Blitz:  -Group instruction provided by PowerPoint slides, verbal discussion, and written materials to support subject matter. The instructor gives an explanation and review of the physiology behind type 1 and type 2 diabetes, diabetes medications and rational behind using different medications, pre- and post-prandial blood glucose recommendations and Hemoglobin A1c goals, diabetes diet, and exercise including blood glucose guidelines for exercising safely.    Portion Distortion:  -Group instruction provided by PowerPoint slides, verbal discussion, written materials, and food models to support subject matter. The instructor gives an explanation of serving size versus portion size, changes in portions sizes over the last 20 years, and what consists of a serving from each food group.   Stress Management:  -Group instruction provided by verbal instruction, video, and written materials to support subject matter.  Instructors review role of stress in heart disease and how to cope with stress positively.     Exercising on Your Own:  -Group instruction provided by verbal instruction, power point, and written materials to support subject.  Instructors discuss benefits of exercise, components of exercise, frequency and intensity of exercise, and end points for exercise.  Also discuss use of nitroglycerin and activating EMS.  Review options of places to exercise outside of rehab.  Review guidelines for sex with heart disease.   Cardiac Drugs I:  -Group instruction provided by verbal instruction and written materials to support subject.  Instructor reviews cardiac drug classes: antiplatelets, anticoagulants, beta blockers, and statins.  Instructor discusses reasons, side effects, and lifestyle considerations for each drug class.   Cardiac Drugs II:  -Group instruction provided by verbal instruction and written materials to  support subject.  Instructor reviews cardiac drug classes: angiotensin converting enzyme inhibitors (ACE-I), angiotensin II receptor blockers (ARBs), nitrates, and calcium channel blockers.  Instructor discusses reasons, side effects, and lifestyle considerations for each drug class.   Anatomy and Physiology of the Circulatory System:  Group verbal and written instruction and models provide basic cardiac anatomy and physiology, with the coronary electrical and arterial systems. Review of: AMI, Angina, Valve disease, Heart Failure, Peripheral Artery Disease, Cardiac Arrhythmia, Pacemakers, and the ICD.   Other Education:  -Group or individual verbal, written, or video instructions that support the educational goals of the cardiac rehab program.   Knowledge Questionnaire Score:     Knowledge Questionnaire Score - 12/11/16 1406      Knowledge Questionnaire Score   Pre Score N/A- pt states he has no understanding of heart disease      Core Components/Risk Factors/Patient Goals at Admission:     Personal Goals and Risk Factors at Admission - 12/11/16 1538      Core Components/Risk Factors/Patient Goals on Admission   Heart Failure Yes   Intervention Provide a combined exercise and nutrition program that is supplemented with education, support and counseling about heart failure. Directed toward relieving symptoms such as shortness of breath, decreased exercise tolerance, and extremity edema.   Expected Outcomes Improve functional capacity of life;Short term: Attendance in program 2-3 days a week with increased exercise capacity. Reported lower sodium intake. Reported increased fruit and vegetable intake. Reports medication compliance.;Short term: Daily weights obtained and reported for increase. Utilizing diuretic protocols set by physician.;Long term: Adoption of self-care skills and reduction of barriers for early signs and symptoms recognition and intervention leading to self-care  maintenance.   Lipids Yes   Intervention Provide education and support for participant on nutrition & aerobic/resistive exercise along with prescribed medications to achieve LDL 70mg , HDL >40mg .   Expected Outcomes Short Term: Participant states understanding of desired cholesterol values and is compliant with medications prescribed. Participant is following exercise prescription and nutrition guidelines.;Long Term: Cholesterol controlled with medications as prescribed, with individualized exercise RX and with personalized nutrition plan. Value goals: LDL < 70mg , HDL > 40 mg.      Core Components/Risk Factors/Patient Goals Review:    Core Components/Risk Factors/Patient Goals at Discharge (Final Review):    ITP Comments:     ITP Comments    Row Name 12/11/16 [place] Director, Dr. Fransico Him          Comments:  Pt started cardiac rehab today.  Pt tolerated light exercise without difficulty. VSS, telemetry-SR with neg QRS. Pt  asymptomatic.  Medication list reconciled. Pt denies barriers to medication compliance.  PSYCHOSOCIAL ASSESSMENT:  PHQ-0. Pt exhibits positive coping skills, hopeful outlook with supportive family. Pt completed pre assessment quality of life survey.  Pt scored the following:     Quality of Life - 12/11/16 1407      Quality of Life Scores   Health/Function Pre 30 %   Socioeconomic Pre 30 %   Psych/Spiritual Pre 30 %  Family Pre 30 %   GLOBAL Pre 30 %      No psychosocial needs identified at this time, no psychosocial interventions necessary.    Pt enjoys walking his dog.   Pt oriented to exercise equipment and routine.  Understanding verbalized. Cherre Huger, BSN Cardiac and Training and development officer

## 2016-12-18 NOTE — Progress Notes (Signed)
Cardiac Individual Treatment Plan  Patient Details  Name: Jason Day MRN: [id number] Date of Birth: [birth year] Referring Provider:     CARDIAC REHAB PHASE II ORIENTATION from 12/11/2016 in [place]  Referring Provider  Jenkins Rouge MD      Initial Encounter Date:    CARDIAC REHAB PHASE II ORIENTATION from 12/11/2016 in [place]  Date  12/11/16  Referring Provider  Jenkins Rouge MD      Visit Diagnosis: S/P CABG x 5  Patient's Home Medications on Admission:  Current Outpatient Prescriptions:  .  amiodarone (PACERONE) 200 MG tablet, Take 1 tablet (200 mg total) by mouth daily., Disp: 30 tablet, Rfl: 6 .  aspirin EC 81 MG tablet, Take 81 mg by mouth daily., Disp: , Rfl:  .  digoxin (LANOXIN) 0.125 MG tablet, Take 0.5 tablets (0.0625 mg total) by mouth daily., Disp: 45 tablet, Rfl: 3 .  lisinopril (PRINIVIL,ZESTRIL) 10 MG tablet, Take 1 tablet (10 mg total) by mouth daily., Disp: 30 tablet, Rfl: 6 .  metoprolol succinate (TOPROL XL) 25 MG 24 hr tablet, Take 1 tablet (25 mg total) by mouth daily., Disp: 90 tablet, Rfl: 3 .  rosuvastatin (CRESTOR) 10 MG tablet, Take 1 tablet (10 mg total) by mouth at bedtime., Disp: 90 tablet, Rfl: 3 .  warfarin (COUMADIN) 5 MG tablet, Take 1 tablet (5 mg total) by mouth as directed. Or as directed., Disp: 30 tablet, Rfl: 2  Past Medical History: Past Medical History:  Diagnosis Date  . Atrial fibrillation ([place])   . Chronic combined systolic and diastolic heart failure ([place])   . Chronic sinus complaints    overuses afrin  . Coronary atherosclerosis of native coronary artery   . Hyperlipidemia     Tobacco Use: History  Smoking Status  . Never Smoker  Smokeless Tobacco  . Never Used    Labs: Recent Review Flowsheet Data    Labs for ITP Cardiac and Pulmonary Rehab Latest Ref Rng & Units 10/18/2016 10/18/2016 10/18/2016 10/19/2016 10/20/2016   Cholestrol 0 - 200 mg/dL - - - - -    LDLCALC 0 - 99 mg/dL - - - - -   HDL >40 mg/dL - - - - -   Trlycerides <150 mg/dL - - - - -   Hemoglobin A1c 4.8 - 5.6 % - - - - -   PHART 7.350 - 7.450 7.354 - 7.332(L) - -   PCO2ART 32.0 - 48.0 mmHg 45.8 - 48.3(H) - -   HCO3 20.0 - 28.0 mmol/L 25.5 - 25.4 - -   TCO2 22 - 32 mmol/L 27 24 27 26  -   ACIDBASEDEF 0.0 - 2.0 mmol/L - - 1.0 - -   O2SAT % 95.0 - 93.0 73.0 70.2      Capillary Blood Glucose: Lab Results  Component Value Date   GLUCAP 176 (H) 10/19/2016   GLUCAP 101 (H) 10/19/2016   GLUCAP 98 10/19/2016   GLUCAP 115 (H) 10/19/2016   GLUCAP 134 (H) 10/18/2016     Exercise Target Goals:    Exercise Program Goal: Individual exercise prescription set with THRR, safety & activity barriers. Participant demonstrates ability to understand and report RPE using BORG scale, to self-measure pulse accurately, and to acknowledge the importance of the exercise prescription.  Exercise Prescription Goal: Starting with aerobic activity 30 plus minutes a day, 3 days per week for initial exercise prescription. Provide home exercise prescription and guidelines that participant acknowledges understanding prior  to discharge.  Activity Barriers & Risk Stratification:     Activity Barriers & Cardiac Risk Stratification - 12/11/16 1537      Activity Barriers & Cardiac Risk Stratification   Activity Barriers Arthritis;Back Problems;Deconditioning;Muscular Weakness;Other (comment)   Comments B foot discomfort   Cardiac Risk Stratification High      6 Minute Walk:     6 Minute Walk    Row Name 12/11/16 1535         6 Minute Walk   Phase Initial     Distance 1400 feet     Walk Time 6 minutes     # of Rest Breaks 0     MPH 2.65     METS 3.14     RPE 12     VO2 Peak 10.99     Symptoms No     Resting HR 63 bpm     Resting BP 117/64     Resting Oxygen Saturation  99 %     Exercise Oxygen Saturation  during 6 min walk 98 %     Max Ex. HR 103 bpm     Max Ex. BP 160/64         Oxygen Initial Assessment:   Oxygen Re-Evaluation:   Oxygen Discharge (Final Oxygen Re-Evaluation):   Initial Exercise Prescription:     Initial Exercise Prescription - 12/11/16 1500      Date of Initial Exercise RX and Referring Provider   Date 12/11/16   Referring Provider Jenkins Rouge MD     Bike   Level 0.5   Minutes 10     NuStep   Level 3   SPM 70   Minutes 10   METs 2.5     Track   Laps 12   Minutes 10   METs 3.09     Prescription Details   Frequency (times per week) 3   Duration Progress to 30 minutes of continuous aerobic without signs/symptoms of physical distress     Intensity   THRR 40-80% of Max Heartrate 58-116   Ratings of Perceived Exertion 11-13   Perceived Dyspnea 0-4     Progression   Progression Continue to progress workloads to maintain intensity without signs/symptoms of physical distress.     Resistance Training   Training Prescription Yes   Weight 3lbs   Reps 10-15      Perform Capillary Blood Glucose checks as needed.  Exercise Prescription Changes:      Exercise Prescription Changes    Row Name 12/17/16 1620             Response to Exercise   Blood Pressure (Admit) 124/58       Blood Pressure (Exercise) 160/66       Blood Pressure (Exit) 124/60       Heart Rate (Admit) 67 bpm       Heart Rate (Exercise) 102 bpm       Heart Rate (Exit) 64 bpm       Symptoms none       Comments pt responded well to exercise session.       Duration Continue with 30 min of aerobic exercise without signs/symptoms of physical distress.       Intensity THRR unchanged         Progression   Progression Continue to progress workloads to maintain intensity without signs/symptoms of physical distress.       Average METs 2.5         Resistance  Training   Training Prescription Yes       Weight 3lbs       Reps 10-15       Time 10 Minutes         Bike   Level 0.5       Minutes 10         NuStep   Level 3       SPM 70        Minutes 10       METs 2         Track   Laps 14       Minutes 10       METs 3.43          Exercise Comments:      Exercise Comments    Row Name 12/18/16 1627           Exercise Comments Pt was oriented to exercise equipment today. Pt responded well to exercise session; will continue to monitor pt's activity levels.          Exercise Goals and Review:      Exercise Goals    Row Name 12/11/16 1405             Exercise Goals   Increase Physical Activity Yes       Intervention Provide advice, education, support and counseling about physical activity/exercise needs.;Develop an individualized exercise prescription for aerobic and resistive training based on initial evaluation findings, risk stratification, comorbidities and participant's personal goals.       Expected Outcomes Achievement of increased cardiorespiratory fitness and enhanced flexibility, muscular endurance and strength shown through measurements of functional capacity and personal statement of participant.       Increase Strength and Stamina Yes       Intervention Provide advice, education, support and counseling about physical activity/exercise needs.;Develop an individualized exercise prescription for aerobic and resistive training based on initial evaluation findings, risk stratification, comorbidities and participant's personal goals.       Expected Outcomes Achievement of increased cardiorespiratory fitness and enhanced flexibility, muscular endurance and strength shown through measurements of functional capacity and personal statement of participant.       Able to understand and use rate of perceived exertion (RPE) scale Yes       Intervention Provide education and explanation on how to use RPE scale       Expected Outcomes Short Term: Able to use RPE daily in rehab to express subjective intensity level;Long Term:  Able to use RPE to guide intensity level when exercising independently       Knowledge and  understanding of Target Heart Rate Range (THRR) Yes       Intervention Provide education and explanation of THRR including how the numbers were predicted and where they are located for reference       Expected Outcomes Short Term: Able to state/look up THRR;Long Term: Able to use THRR to govern intensity when exercising independently;Short Term: Able to use daily as guideline for intensity in rehab       Able to check pulse independently Yes       Intervention Provide education and demonstration on how to check pulse in carotid and radial arteries.;Review the importance of being able to check your own pulse for safety during independent exercise       Expected Outcomes Short Term: Able to explain why pulse checking is important during independent exercise;Long Term: Able to check pulse independently and accurately  Understanding of Exercise Prescription Yes       Intervention Provide education, explanation, and written materials on patient's individual exercise prescription       Expected Outcomes Short Term: Able to explain program exercise prescription;Long Term: Able to explain home exercise prescription to exercise independently          Exercise Goals Re-Evaluation :     Exercise Goals Re-Evaluation    Row Name 12/18/16 1628             Exercise Goal Re-Evaluation   Exercise Goals Review Able to understand and use rate of perceived exertion (RPE) scale       Comments Pt demonstrated proper use of RPE scale with activity.       Expected Outcomes Pt will continue to improve in aerobic fitness           Discharge Exercise Prescription (Final Exercise Prescription Changes):     Exercise Prescription Changes - 12/17/16 1620      Response to Exercise   Blood Pressure (Admit) 124/58   Blood Pressure (Exercise) 160/66   Blood Pressure (Exit) 124/60   Heart Rate (Admit) 67 bpm   Heart Rate (Exercise) 102 bpm   Heart Rate (Exit) 64 bpm   Symptoms none   Comments pt  responded well to exercise session.   Duration Continue with 30 min of aerobic exercise without signs/symptoms of physical distress.   Intensity THRR unchanged     Progression   Progression Continue to progress workloads to maintain intensity without signs/symptoms of physical distress.   Average METs 2.5     Resistance Training   Training Prescription Yes   Weight 3lbs   Reps 10-15   Time 10 Minutes     Bike   Level 0.5   Minutes 10     NuStep   Level 3   SPM 70   Minutes 10   METs 2     Track   Laps 14   Minutes 10   METs 3.43      Nutrition:  Target Goals: Understanding of nutrition guidelines, daily intake of sodium 1500mg , cholesterol 200mg , calories 30% from fat and 7% or less from saturated fats, daily to have 5 or more servings of fruits and vegetables.  Biometrics:     Pre Biometrics - 12/11/16 1537      Pre Biometrics   Waist Circumference 40.5 inches   Hip Circumference 42 inches   Waist to Hip Ratio 0.96 %   Triceps Skinfold 19 mm   % Body Fat 28.5 %   Grip Strength 34 kg   Flexibility 0 in   Single Leg Stand 23 seconds       Nutrition Therapy Plan and Nutrition Goals:     Nutrition Therapy & Goals - 12/12/16 0844      Nutrition Therapy   Diet Therapeutic Lifestyle Change     Personal Nutrition Goals   Nutrition Goal Pt to identify and limit food sources of saturated fat, trans fat, and sodium     Intervention Plan   Intervention Prescribe, educate and counsel regarding individualized specific dietary modifications aiming towards targeted core components such as weight, hypertension, lipid management, diabetes, heart failure and other comorbidities.   Expected Outcomes Short Term Goal: Understand basic principles of dietary content, such as calories, fat, sodium, cholesterol and nutrients.;Long Term Goal: Adherence to prescribed nutrition plan.      Nutrition Discharge: Nutrition Scores:     Nutrition Assessments - 12/12/16 7035  MEDFICTS Scores   Pre Score 50      Nutrition Goals Re-Evaluation:   Nutrition Goals Re-Evaluation:   Nutrition Goals Discharge (Final Nutrition Goals Re-Evaluation):   Psychosocial: Target Goals: Acknowledge presence or absence of significant depression and/or stress, maximize coping skills, provide positive support system. Participant is able to verbalize types and ability to use techniques and skills needed for reducing stress and depression.  Initial Review & Psychosocial Screening:     Initial Psych Review & Screening - 12/11/16 1620      Initial Review   Current issues with None Identified     Family Dynamics   Good Support System? Yes     Barriers   Psychosocial barriers to participate in program The patient should benefit from training in stress management and relaxation.     Screening Interventions   Interventions Encouraged to exercise      Quality of Life Scores:     Quality of Life - 12/11/16 1407      Quality of Life Scores   Health/Function Pre 30 %   Socioeconomic Pre 30 %   Psych/Spiritual Pre 30 %   Family Pre 30 %   GLOBAL Pre 30 %      PHQ-9: Recent Review Flowsheet Data    Depression screen [facility] 2/9 12/17/2016 11/02/2016   Decreased Interest 0 0   Down, Depressed, Hopeless 0 0   PHQ - 2 Score 0 0     Interpretation of Total Score  Total Score Depression Severity:  1-4 = Minimal depression, 5-9 = Mild depression, 10-14 = Moderate depression, 15-19 = Moderately severe depression, 20-27 = Severe depression   Psychosocial Evaluation and Intervention:     Psychosocial Evaluation - 12/18/16 1413      Psychosocial Evaluation & Interventions   Interventions Encouraged to exercise with the program and follow exercise prescription;Stress management education;Relaxation education   Comments Pt has no psychosocial needs at this time, pt feels supported in his rehab participation.  QOL scores show no isssues or areas of concern.    Expected Outcomes Pt will continue to display positive and healthy coping skills.   Continue Psychosocial Services  No Follow up required      Psychosocial Re-Evaluation:     Psychosocial Re-Evaluation    [place] Name 12/18/16 1414             Psychosocial Re-Evaluation   Comments Pt with QOL scores at 30 for each component.  Pt denies any needs moved here from [place] and is settling into his new home       Interventions Relaxation education;Encouraged to attend Cardiac Rehabilitation for the exercise;Stress management education       Continue Psychosocial Services  No Follow up required       Comments moved to the area and is unpacking and renovating his new home         Initial Review   Source of Stress Concerns None Identified          Psychosocial Discharge (Final Psychosocial Re-Evaluation):     Psychosocial Re-Evaluation - 12/18/16 1414      Psychosocial Re-Evaluation   Comments Pt with QOL scores at 30 for each component.  Pt denies any needs moved here from [place] and is settling into his new home   Interventions Relaxation education;Encouraged to attend Cardiac Rehabilitation for the exercise;Stress management education   Continue Psychosocial Services  No Follow up required   Comments moved to the area and is unpacking and renovating  his new home     Initial Review   Source of Stress Concerns None Identified      Vocational Rehabilitation: Provide vocational rehab assistance to qualifying candidates.   Vocational Rehab Evaluation & Intervention:     Vocational Rehab - 12/11/16 1620      Initial Vocational Rehab Evaluation & Intervention   Assessment shows need for Vocational Rehabilitation No      Education: Education Goals: Education classes will be provided on a weekly basis, covering required topics. Participant will state understanding/return demonstration of topics presented.  Learning Barriers/Preferences:     Learning Barriers/Preferences -  12/11/16 1405      Learning Barriers/Preferences   Learning Barriers Sight   Learning Preferences Skilled Demonstration      Education Topics: Count Your Pulse:  -Group instruction provided by verbal instruction, demonstration, patient participation and written materials to support subject.  Instructors address importance of being able to find your pulse and how to count your pulse when at home without a heart monitor.  Patients get hands on experience counting their pulse with staff help and individually.   Heart Attack, Angina, and Risk Factor Modification:  -Group instruction provided by verbal instruction, video, and written materials to support subject.  Instructors address signs and symptoms of angina and heart attacks.    Also discuss risk factors for heart disease and how to make changes to improve heart health risk factors.   Functional Fitness:  -Group instruction provided by verbal instruction, demonstration, patient participation, and written materials to support subject.  Instructors address safety measures for doing things around the house.  Discuss how to get up and down off the floor, how to pick things up properly, how to safely get out of a chair without assistance, and balance training.   Meditation and Mindfulness:  -Group instruction provided by verbal instruction, patient participation, and written materials to support subject.  Instructor addresses importance of mindfulness and meditation practice to help reduce stress and improve awareness.  Instructor also leads participants through a meditation exercise.    Stretching for Flexibility and Mobility:  -Group instruction provided by verbal instruction, patient participation, and written materials to support subject.  Instructors lead participants through series of stretches that are designed to increase flexibility thus improving mobility.  These stretches are additional exercise for major muscle groups that are  typically performed during regular warm up and cool down.   Hands Only CPR:  -Group verbal, video, and participation provides a basic overview of AHA guidelines for community CPR. Role-play of emergencies allow participants the opportunity to practice calling for help and chest compression technique with discussion of AED use.   Hypertension: -Group verbal and written instruction that provides a basic overview of hypertension including the most recent diagnostic guidelines, risk factor reduction with self-care instructions and medication management.    Nutrition I class: Heart Healthy Eating:  -Group instruction provided by PowerPoint slides, verbal discussion, and written materials to support subject matter. The instructor gives an explanation and review of the Therapeutic Lifestyle Changes diet recommendations, which includes a discussion on lipid goals, dietary fat, sodium, fiber, plant stanol/sterol esters, sugar, and the components of a well-balanced, healthy diet.   Nutrition II class: Lifestyle Skills:  -Group instruction provided by PowerPoint slides, verbal discussion, and written materials to support subject matter. The instructor gives an explanation and review of label reading, grocery shopping for heart health, heart healthy recipe modifications, and ways to make healthier choices when eating out.  Diabetes Question & Answer:  -Group instruction provided by PowerPoint slides, verbal discussion, and written materials to support subject matter. The instructor gives an explanation and review of diabetes co-morbidities, pre- and post-prandial blood glucose goals, pre-exercise blood glucose goals, signs, symptoms, and treatment of hypoglycemia and hyperglycemia, and foot care basics.   Diabetes Blitz:  -Group instruction provided by PowerPoint slides, verbal discussion, and written materials to support subject matter. The instructor gives an explanation and review of the physiology  behind type 1 and type 2 diabetes, diabetes medications and rational behind using different medications, pre- and post-prandial blood glucose recommendations and Hemoglobin A1c goals, diabetes diet, and exercise including blood glucose guidelines for exercising safely.    Portion Distortion:  -Group instruction provided by PowerPoint slides, verbal discussion, written materials, and food models to support subject matter. The instructor gives an explanation of serving size versus portion size, changes in portions sizes over the last 20 years, and what consists of a serving from each food group.   Stress Management:  -Group instruction provided by verbal instruction, video, and written materials to support subject matter.  Instructors review role of stress in heart disease and how to cope with stress positively.     Exercising on Your Own:  -Group instruction provided by verbal instruction, power point, and written materials to support subject.  Instructors discuss benefits of exercise, components of exercise, frequency and intensity of exercise, and end points for exercise.  Also discuss use of nitroglycerin and activating EMS.  Review options of places to exercise outside of rehab.  Review guidelines for sex with heart disease.   Cardiac Drugs I:  -Group instruction provided by verbal instruction and written materials to support subject.  Instructor reviews cardiac drug classes: antiplatelets, anticoagulants, beta blockers, and statins.  Instructor discusses reasons, side effects, and lifestyle considerations for each drug class.   Cardiac Drugs II:  -Group instruction provided by verbal instruction and written materials to support subject.  Instructor reviews cardiac drug classes: angiotensin converting enzyme inhibitors (ACE-I), angiotensin II receptor blockers (ARBs), nitrates, and calcium channel blockers.  Instructor discusses reasons, side effects, and lifestyle considerations for each drug  class.   Anatomy and Physiology of the Circulatory System:  Group verbal and written instruction and models provide basic cardiac anatomy and physiology, with the coronary electrical and arterial systems. Review of: AMI, Angina, Valve disease, Heart Failure, Peripheral Artery Disease, Cardiac Arrhythmia, Pacemakers, and the ICD.   Other Education:  -Group or individual verbal, written, or video instructions that support the educational goals of the cardiac rehab program.   Knowledge Questionnaire Score:     Knowledge Questionnaire Score - 12/11/16 1406      Knowledge Questionnaire Score   Pre Score N/A- pt states he has no understanding of heart disease      Core Components/Risk Factors/Patient Goals at Admission:     Personal Goals and Risk Factors at Admission - 12/11/16 1538      Core Components/Risk Factors/Patient Goals on Admission   Heart Failure Yes   Intervention Provide a combined exercise and nutrition program that is supplemented with education, support and counseling about heart failure. Directed toward relieving symptoms such as shortness of breath, decreased exercise tolerance, and extremity edema.   Expected Outcomes Improve functional capacity of life;Short term: Attendance in program 2-3 days a week with increased exercise capacity. Reported lower sodium intake. Reported increased fruit and vegetable intake. Reports medication compliance.;Short term: Daily weights obtained and reported for increase. Utilizing diuretic  protocols set by physician.;Long term: Adoption of self-care skills and reduction of barriers for early signs and symptoms recognition and intervention leading to self-care maintenance.   Lipids Yes   Intervention Provide education and support for participant on nutrition & aerobic/resistive exercise along with prescribed medications to achieve LDL 70mg , HDL >40mg .   Expected Outcomes Short Term: Participant states understanding of desired cholesterol  values and is compliant with medications prescribed. Participant is following exercise prescription and nutrition guidelines.;Long Term: Cholesterol controlled with medications as prescribed, with individualized exercise RX and with personalized nutrition plan. Value goals: LDL < 70mg , HDL > 40 mg.      Core Components/Risk Factors/Patient Goals Review:      Goals and Risk Factor Review    Row Name 12/18/16 1410             Core Components/Risk Factors/Patient Goals Review   Personal Goals Review Heart Failure;Lipids       Review Pt began participating in cardiac rehab this week. and is off to a great start.  Pt is eager to learn more about risk factor modification for CAD.  Encourage pt to participate in education and exercise classes.       Expected Outcomes Pt will understand the signs and symptoms of acute heart failure, observe dietary restrictions, weigh daily and when to contact the MD office.  Pt will have lipid panel readings within normal limit and observe dietary intake for heart healthy die.          Core Components/Risk Factors/Patient Goals at Discharge (Final Review):      Goals and Risk Factor Review - 12/18/16 1410      Core Components/Risk Factors/Patient Goals Review   Personal Goals Review Heart Failure;Lipids   Review Pt began participating in cardiac rehab this week. and is off to a great start.  Pt is eager to learn more about risk factor modification for CAD.  Encourage pt to participate in education and exercise classes.   Expected Outcomes Pt will understand the signs and symptoms of acute heart failure, observe dietary restrictions, weigh daily and when to contact the MD office.  Pt will have lipid panel readings within normal limit and observe dietary intake for heart healthy die.      ITP Comments:     ITP Comments    Row Name 12/11/16 1359 12/21/16 0843         ITP Comments Medical Director, Dr. Fransico Him Medical Director, Dr. Fransico Him          Comments:  Jason Day is off to a good start  toward personal goals after completing 3 sessions. PSYCHOSOCIAL ASSESSMENT:  Pt exhibits positive coping skills, hopeful outlook with supportive family. Pt completed pre assessment quality of life survey with perfect scores of 30% for each category.  Pt remarked that he did not know for sure if he would like CR and is surprised by how much he enjoyed it. Recommend continued exercise and life style modification education including  stress management and relaxation techniques to decrease cardiac risk profile. Cherre Huger, BSN Cardiac and Training and development officer

## 2016-12-19 ENCOUNTER — Encounter (HOSPITAL_COMMUNITY)
Admission: RE | Admit: 2016-12-19 | Discharge: 2016-12-19 | Disposition: A | Discharge status: Home / Self Care | Payer: Medicare Other | Source: Ambulatory Visit | Attending: Cardiovascular Disease | Admitting: Cardiovascular Disease

## 2016-12-19 DIAGNOSIS — I2510 Atherosclerotic heart disease of native coronary artery without angina pectoris: Secondary | ICD-10-CM | POA: Diagnosis not present

## 2016-12-19 DIAGNOSIS — Z951 Presence of aortocoronary bypass graft: Secondary | ICD-10-CM | POA: Diagnosis not present

## 2016-12-19 DIAGNOSIS — Z7982 Long term (current) use of aspirin: Secondary | ICD-10-CM | POA: Diagnosis not present

## 2016-12-19 DIAGNOSIS — I4891 Unspecified atrial fibrillation: Secondary | ICD-10-CM | POA: Diagnosis not present

## 2016-12-19 DIAGNOSIS — Z79899 Other long term (current) drug therapy: Secondary | ICD-10-CM | POA: Diagnosis not present

## 2016-12-19 DIAGNOSIS — I5042 Chronic combined systolic (congestive) and diastolic (congestive) heart failure: Secondary | ICD-10-CM | POA: Diagnosis not present

## 2016-12-21 ENCOUNTER — Encounter (HOSPITAL_COMMUNITY)
Admission: RE | Admit: 2016-12-21 | Discharge: 2016-12-21 | Disposition: A | Discharge status: Home / Self Care | Payer: Medicare Other | Source: Ambulatory Visit | Attending: Cardiovascular Disease | Admitting: Cardiovascular Disease

## 2016-12-21 DIAGNOSIS — Z7901 Long term (current) use of anticoagulants: Secondary | ICD-10-CM | POA: Diagnosis not present

## 2016-12-21 DIAGNOSIS — I5042 Chronic combined systolic (congestive) and diastolic (congestive) heart failure: Secondary | ICD-10-CM | POA: Insufficient documentation

## 2016-12-21 DIAGNOSIS — Z79899 Other long term (current) drug therapy: Secondary | ICD-10-CM | POA: Insufficient documentation

## 2016-12-21 DIAGNOSIS — Z951 Presence of aortocoronary bypass graft: Secondary | ICD-10-CM | POA: Diagnosis not present

## 2016-12-21 DIAGNOSIS — E785 Hyperlipidemia, unspecified: Secondary | ICD-10-CM | POA: Diagnosis not present

## 2016-12-21 DIAGNOSIS — I4891 Unspecified atrial fibrillation: Secondary | ICD-10-CM | POA: Diagnosis not present

## 2016-12-21 DIAGNOSIS — Z7982 Long term (current) use of aspirin: Secondary | ICD-10-CM | POA: Diagnosis not present

## 2016-12-21 DIAGNOSIS — I2510 Atherosclerotic heart disease of native coronary artery without angina pectoris: Secondary | ICD-10-CM | POA: Diagnosis not present

## 2016-12-24 ENCOUNTER — Other Ambulatory Visit: Payer: Self-pay

## 2016-12-24 ENCOUNTER — Encounter (HOSPITAL_COMMUNITY)
Admission: RE | Admit: 2016-12-24 | Discharge: 2016-12-24 | Disposition: A | Discharge status: Home / Self Care | Payer: Medicare Other | Source: Ambulatory Visit | Attending: Cardiovascular Disease | Admitting: Cardiovascular Disease

## 2016-12-24 DIAGNOSIS — I4891 Unspecified atrial fibrillation: Secondary | ICD-10-CM | POA: Diagnosis not present

## 2016-12-24 DIAGNOSIS — Z951 Presence of aortocoronary bypass graft: Secondary | ICD-10-CM

## 2016-12-24 DIAGNOSIS — I5042 Chronic combined systolic (congestive) and diastolic (congestive) heart failure: Secondary | ICD-10-CM | POA: Diagnosis not present

## 2016-12-24 DIAGNOSIS — I2510 Atherosclerotic heart disease of native coronary artery without angina pectoris: Secondary | ICD-10-CM | POA: Diagnosis not present

## 2016-12-24 DIAGNOSIS — Z7982 Long term (current) use of aspirin: Secondary | ICD-10-CM | POA: Diagnosis not present

## 2016-12-24 DIAGNOSIS — Z79899 Other long term (current) drug therapy: Secondary | ICD-10-CM | POA: Diagnosis not present

## 2016-12-24 MED ORDER — DIGOXIN 125 MCG PO TABS: 0.0625 mg | ORAL_TABLET | Freq: Every day | ORAL | 3 refills | Status: DC

## 2016-12-24 MED ORDER — AMIODARONE HCL 200 MG PO TABS: 200.0000 mg | ORAL_TABLET | Freq: Every day | ORAL | 6 refills | Status: DC

## 2016-12-25 ENCOUNTER — Telehealth: Payer: Self-pay

## 2016-12-25 ENCOUNTER — Telehealth (HOSPITAL_COMMUNITY): Payer: Self-pay | Admitting: Cardiac Rehabilitation

## 2016-12-25 DIAGNOSIS — E785 Hyperlipidemia, unspecified: Secondary | ICD-10-CM

## 2016-12-25 DIAGNOSIS — I25810 Atherosclerosis of coronary artery bypass graft(s) without angina pectoris: Secondary | ICD-10-CM

## 2016-12-25 NOTE — Telephone Encounter (Signed)
Patient will have fasting lipid and liver lab work on 01/21/17. Patient is worried about having MRI in January and stated he is extremely claustrophobic. Informed patient that Dr. Johnsie Cancel usually will have his patients take valium to help. Patient's wife stated in the back ground that he will need to be knocked out, or there is no way. Will forward to Dr. Johnsie Cancel for advisement.

## 2016-12-25 NOTE — Telephone Encounter (Signed)
-----   Message from Josue Hector, MD sent at 12/24/2016  9:23 PM EST ----- Regarding: RE: cardiac rehab  Can have repeat lipid and liver in December  ----- Message ----- From: Lowell Guitar, RN Sent: 12/24/2016   3:07 PM To: Josue Hector, MD Subject: cardiac rehab                                   Dear Dr. Johnsie Cancel,  Pt is concerned that he is taking Crestor however has not had recheck lipid profile.  Looks like last FLP was 09/2016 in hospital.  When would you recommend he have a recheck?   Would you prefer this be managed in your office or with his PCP?  Thank you, Andi Hence, RN, BSN Cardiac Pulmonary Rehab

## 2016-12-26 ENCOUNTER — Encounter (HOSPITAL_COMMUNITY)
Admission: RE | Admit: 2016-12-26 | Discharge: 2016-12-26 | Disposition: A | Discharge status: Home / Self Care | Payer: Medicare Other | Source: Ambulatory Visit | Attending: Cardiovascular Disease | Admitting: Cardiovascular Disease

## 2016-12-26 DIAGNOSIS — I5042 Chronic combined systolic (congestive) and diastolic (congestive) heart failure: Secondary | ICD-10-CM | POA: Diagnosis not present

## 2016-12-26 DIAGNOSIS — Z79899 Other long term (current) drug therapy: Secondary | ICD-10-CM | POA: Diagnosis not present

## 2016-12-26 DIAGNOSIS — I2510 Atherosclerotic heart disease of native coronary artery without angina pectoris: Secondary | ICD-10-CM | POA: Diagnosis not present

## 2016-12-26 DIAGNOSIS — Z951 Presence of aortocoronary bypass graft: Secondary | ICD-10-CM

## 2016-12-26 DIAGNOSIS — Z7982 Long term (current) use of aspirin: Secondary | ICD-10-CM | POA: Diagnosis not present

## 2016-12-26 DIAGNOSIS — I4891 Unspecified atrial fibrillation: Secondary | ICD-10-CM | POA: Diagnosis not present

## 2016-12-26 NOTE — Telephone Encounter (Signed)
If he doesn;t think he can do mri just order echo

## 2016-12-26 NOTE — Telephone Encounter (Signed)
Left message for patient to call back  

## 2016-12-27 NOTE — Telephone Encounter (Signed)
Follow up     Patient is returning Pam call about echo

## 2016-12-27 NOTE — Telephone Encounter (Signed)
Patient called back. Informed patient of Dr. Kyla Balzarine recommendation. Patient agreed to an echo. Will put in order and have [facility] call to schedule.

## 2016-12-28 ENCOUNTER — Encounter (HOSPITAL_COMMUNITY)
Admission: RE | Admit: 2016-12-28 | Discharge: 2016-12-28 | Disposition: A | Discharge status: Home / Self Care | Payer: Medicare Other | Source: Ambulatory Visit | Attending: Cardiovascular Disease | Admitting: Cardiovascular Disease

## 2016-12-28 DIAGNOSIS — Z7982 Long term (current) use of aspirin: Secondary | ICD-10-CM | POA: Diagnosis not present

## 2016-12-28 DIAGNOSIS — I4891 Unspecified atrial fibrillation: Secondary | ICD-10-CM | POA: Diagnosis not present

## 2016-12-28 DIAGNOSIS — Z951 Presence of aortocoronary bypass graft: Secondary | ICD-10-CM | POA: Diagnosis not present

## 2016-12-28 DIAGNOSIS — I5042 Chronic combined systolic (congestive) and diastolic (congestive) heart failure: Secondary | ICD-10-CM | POA: Diagnosis not present

## 2016-12-28 DIAGNOSIS — Z79899 Other long term (current) drug therapy: Secondary | ICD-10-CM | POA: Diagnosis not present

## 2016-12-28 DIAGNOSIS — I2510 Atherosclerotic heart disease of native coronary artery without angina pectoris: Secondary | ICD-10-CM | POA: Diagnosis not present

## 2016-12-28 NOTE — Progress Notes (Signed)
Reviewed home exercise with pt today.  Pt plans to walk at home for 30-45 minutes 3-5 days a week for exercise.  Reviewed THR, pulse, RPE, sign and symptoms, NTG use, and when to call 911 or MD.  Also discussed weather considerations and indoor options.  Pt voiced understanding.

## 2016-12-31 ENCOUNTER — Encounter (HOSPITAL_COMMUNITY)
Admission: RE | Admit: 2016-12-31 | Discharge: 2016-12-31 | Disposition: A | Discharge status: Home / Self Care | Payer: Medicare Other | Source: Ambulatory Visit | Attending: Cardiovascular Disease | Admitting: Cardiovascular Disease

## 2016-12-31 DIAGNOSIS — I4891 Unspecified atrial fibrillation: Secondary | ICD-10-CM | POA: Diagnosis not present

## 2016-12-31 DIAGNOSIS — Z79899 Other long term (current) drug therapy: Secondary | ICD-10-CM | POA: Diagnosis not present

## 2016-12-31 DIAGNOSIS — I5042 Chronic combined systolic (congestive) and diastolic (congestive) heart failure: Secondary | ICD-10-CM | POA: Diagnosis not present

## 2016-12-31 DIAGNOSIS — I2510 Atherosclerotic heart disease of native coronary artery without angina pectoris: Secondary | ICD-10-CM | POA: Diagnosis not present

## 2016-12-31 DIAGNOSIS — Z951 Presence of aortocoronary bypass graft: Secondary | ICD-10-CM | POA: Diagnosis not present

## 2016-12-31 DIAGNOSIS — Z7982 Long term (current) use of aspirin: Secondary | ICD-10-CM | POA: Diagnosis not present

## 2017-01-01 NOTE — Addendum Note (Signed)
Encounter addended by: Dorna Bloom D on: 01/01/2017 4:55 PM  Actions taken: Visit Navigator Flowsheet section accepted

## 2017-01-02 ENCOUNTER — Ambulatory Visit (INDEPENDENT_AMBULATORY_CARE_PROVIDER_SITE_OTHER): Payer: Medicare Other | Admitting: Pharmacist

## 2017-01-02 ENCOUNTER — Telehealth: Payer: Self-pay | Admitting: Cardiovascular Disease

## 2017-01-02 ENCOUNTER — Encounter (HOSPITAL_COMMUNITY)
Admission: RE | Admit: 2017-01-02 | Discharge: 2017-01-02 | Disposition: A | Discharge status: Home / Self Care | Payer: Medicare Other | Source: Ambulatory Visit | Attending: Cardiovascular Disease | Admitting: Cardiovascular Disease

## 2017-01-02 DIAGNOSIS — R0602 Shortness of breath: Secondary | ICD-10-CM

## 2017-01-02 DIAGNOSIS — Z79899 Other long term (current) drug therapy: Secondary | ICD-10-CM | POA: Diagnosis not present

## 2017-01-02 DIAGNOSIS — I4891 Unspecified atrial fibrillation: Secondary | ICD-10-CM

## 2017-01-02 DIAGNOSIS — M7989 Other specified soft tissue disorders: Secondary | ICD-10-CM

## 2017-01-02 DIAGNOSIS — Z7982 Long term (current) use of aspirin: Secondary | ICD-10-CM | POA: Diagnosis not present

## 2017-01-02 DIAGNOSIS — Z5181 Encounter for therapeutic drug level monitoring: Secondary | ICD-10-CM | POA: Diagnosis not present

## 2017-01-02 DIAGNOSIS — I5042 Chronic combined systolic (congestive) and diastolic (congestive) heart failure: Secondary | ICD-10-CM | POA: Diagnosis not present

## 2017-01-02 DIAGNOSIS — Z951 Presence of aortocoronary bypass graft: Secondary | ICD-10-CM

## 2017-01-02 DIAGNOSIS — I2510 Atherosclerotic heart disease of native coronary artery without angina pectoris: Secondary | ICD-10-CM | POA: Diagnosis not present

## 2017-01-02 LAB — POCT INR: INR: 2.3

## 2017-01-02 MED ORDER — FUROSEMIDE 20 MG PO TABS: 20.0000 mg | ORAL_TABLET | Freq: Every day | ORAL | 1 refills | Status: DC

## 2017-01-02 NOTE — Telephone Encounter (Signed)
Add lasix 20 mg daily BMET in 2 weeks see if he can be seen by PA this week or next

## 2017-01-02 NOTE — Telephone Encounter (Signed)
Called patient back with Dr. Kyla Balzarine recommendation. Patient verbalized understanding and will be seen on 01/07/17 for office visit and come in on 01/21/17 for lab work.

## 2017-01-02 NOTE — Telephone Encounter (Signed)
Jason Day is calling because he is gaining weight he is over 203 and is not understanding why he is gaining weight . States that he is not eating and drinking that much . Please call

## 2017-01-02 NOTE — Patient Instructions (Addendum)
Continue on same dosage 1/2 tablet daily except 1 tablet on Wednesdays and Saturdays. Recheck in 2 weeks. [facility]#[phone] Main [phone]

## 2017-01-02 NOTE — Telephone Encounter (Signed)
Patient calling complaining about gaining weight over the last month since he left the hospital and SOB with activity. Patient stated he was 190 lbs, now he is 203 lbs. Patient complained of LLE and abdominal swelling. Patient denies any pain in his left leg or chest pain. Patient is taking his medications as prescribed. Will forward to Dr. Johnsie Cancel for advisement.

## 2017-01-04 ENCOUNTER — Encounter (HOSPITAL_COMMUNITY)
Admission: RE | Admit: 2017-01-04 | Discharge: 2017-01-04 | Disposition: A | Discharge status: Home / Self Care | Payer: Medicare Other | Source: Ambulatory Visit

## 2017-01-04 ENCOUNTER — Encounter (HOSPITAL_COMMUNITY)
Admission: RE | Admit: 2017-01-04 | Discharge: 2017-01-04 | Disposition: A | Discharge status: Home / Self Care | Payer: Medicare Other | Source: Ambulatory Visit | Attending: Cardiovascular Disease | Admitting: Cardiovascular Disease

## 2017-01-04 DIAGNOSIS — I4891 Unspecified atrial fibrillation: Secondary | ICD-10-CM | POA: Diagnosis not present

## 2017-01-04 DIAGNOSIS — Z951 Presence of aortocoronary bypass graft: Secondary | ICD-10-CM

## 2017-01-04 DIAGNOSIS — I2510 Atherosclerotic heart disease of native coronary artery without angina pectoris: Secondary | ICD-10-CM | POA: Diagnosis not present

## 2017-01-04 DIAGNOSIS — I5042 Chronic combined systolic (congestive) and diastolic (congestive) heart failure: Secondary | ICD-10-CM | POA: Diagnosis not present

## 2017-01-04 DIAGNOSIS — Z7982 Long term (current) use of aspirin: Secondary | ICD-10-CM | POA: Diagnosis not present

## 2017-01-04 DIAGNOSIS — Z79899 Other long term (current) drug therapy: Secondary | ICD-10-CM | POA: Diagnosis not present

## 2017-01-07 ENCOUNTER — Encounter (INDEPENDENT_AMBULATORY_CARE_PROVIDER_SITE_OTHER): Payer: Self-pay

## 2017-01-07 ENCOUNTER — Ambulatory Visit (INDEPENDENT_AMBULATORY_CARE_PROVIDER_SITE_OTHER): Payer: Medicare Other | Admitting: Cardiology

## 2017-01-07 ENCOUNTER — Encounter: Payer: Self-pay | Admitting: Cardiology

## 2017-01-07 ENCOUNTER — Encounter (HOSPITAL_COMMUNITY)
Admission: RE | Admit: 2017-01-07 | Discharge: 2017-01-07 | Disposition: A | Discharge status: Home / Self Care | Payer: Medicare Other | Source: Ambulatory Visit | Attending: Cardiovascular Disease | Admitting: Cardiovascular Disease

## 2017-01-07 VITALS — BP 100/52 | HR 64 | Ht 73.0 in | Wt 200.0 lb

## 2017-01-07 DIAGNOSIS — I5042 Chronic combined systolic (congestive) and diastolic (congestive) heart failure: Secondary | ICD-10-CM | POA: Diagnosis not present

## 2017-01-07 DIAGNOSIS — Z951 Presence of aortocoronary bypass graft: Secondary | ICD-10-CM

## 2017-01-07 DIAGNOSIS — I259 Chronic ischemic heart disease, unspecified: Secondary | ICD-10-CM

## 2017-01-07 DIAGNOSIS — I2510 Atherosclerotic heart disease of native coronary artery without angina pectoris: Secondary | ICD-10-CM | POA: Diagnosis not present

## 2017-01-07 DIAGNOSIS — Z79899 Other long term (current) drug therapy: Secondary | ICD-10-CM | POA: Diagnosis not present

## 2017-01-07 DIAGNOSIS — Z7982 Long term (current) use of aspirin: Secondary | ICD-10-CM | POA: Diagnosis not present

## 2017-01-07 DIAGNOSIS — I4891 Unspecified atrial fibrillation: Secondary | ICD-10-CM | POA: Diagnosis not present

## 2017-01-07 DIAGNOSIS — I25758 Atherosclerosis of native coronary artery of transplanted heart with other forms of angina pectoris: Secondary | ICD-10-CM | POA: Diagnosis not present

## 2017-01-07 NOTE — Patient Instructions (Signed)
Medication Instructions:    Your physician recommends that you continue on your current medications as directed. Please refer to the Current Medication list given to you today.   If you need a refill on your cardiac medications before your next appointment, please call your pharmacy.  Labwork: BMET  TODAY    Testing/Procedures: NONE ORDERED  TODAY    Follow-Up:  AS SCHEDULED WITH DR Johnsie Cancel    Any Other Special Instructions Will Be Listed Below (If Applicable).

## 2017-01-07 NOTE — Progress Notes (Signed)
Jason Day 76 y.o. male DOB: 10/16/1940 MRN: [id number]      Nutrition Note  1. S/P CABG x 5    Note Spoke with pt. Nutrition plan and survey reviewed with pt. Pt is following Step 1 of the Therapeutic Lifestyle Changes diet. Pt expressed understanding of the information reviewed. Pt aware of nutrition education classes offered.  Nutrition Diagnosis Food-and nutrition-related knowledge deficit related to lack of exposure to information as related to diagnosis of: ? CVD   Nutrition Intervention Nutrition Intervention ?  Pt's individual nutrition plan reviewed with pt. ? Benefits of adopting Heart Healthy, Mediterranean style, diet discussed when Medficts reviewed.    Nutrition Goal(s):  ? Pt to identify and limit food sources of saturated fat, trans fat, and sodium  Plan:  Pt to attend nutrition classes ? Nutrition I ? Nutrition II ? Portion Distortion  Will provide client-centered nutrition education as part of interdisciplinary care.   Monitor and evaluate progress toward nutrition goal with team.  Derek Mound, M.Ed, RD, LDN, CDE 01/07/2017 11:50 AM

## 2017-01-07 NOTE — Progress Notes (Signed)
01/07/2017 Jason Day   1940-04-12  [id number]  Primary Physician Venia Carbon, MD Primary Cardiologist: Dr. Johnsie Cancel   Reason for Visit/CC: f/u for acute on chronic systolic HF  HPI:  Jason Day is a 76 y.o. 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 He initially presented w/ acute pulmonary edema and diuresed. Echo showed reduced LVEF, down to 30%, leading to LHC, which showed severe multivessel CAD. 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 He had post op afib and started on amiodarne and coumadin. He is now in NSR. He has been on guidelines medical therapy for systolic HF with plans to recheck EF by echocardiogram next month to see if he will need to be referred for ICD.  He is here in clinic today for f/u after calling the office with complaints of weight gain, LEE and exertional dyspnea. Dr. Johnsie Cancel ordered for him to start 20 mg PO Lasix.   Today in clinic, he notes significant symptoms. Good diuresis/UOP. Weight is trending down back towards his baseline. His exertional dyspnea has resolved. He still has 1+ ankle edema, L>R but overall significantly better. He needs a f/u BMP today.   He denies CP. Fully compliant with meds. HR 64 bpm. BP 100/62. INRs are followed in our coumadin clinic and have been stable.   Current Meds  Medication Sig  . amiodarone (PACERONE) 200 MG tablet Take 1 tablet (200 mg total) daily by mouth.  Marland Kitchen aspirin EC 81 MG tablet Take 81 mg by mouth daily.  . digoxin (LANOXIN) 0.125 MG tablet Take 0.5 tablets (0.0625 mg total) daily by mouth.  . furosemide (LASIX) 20 MG tablet Take 1 tablet (20 mg total) daily by mouth.  Marland Kitchen lisinopril (PRINIVIL,ZESTRIL) 10 MG tablet Take 1 tablet (10 mg total) by mouth daily.  . metoprolol succinate (TOPROL XL) 25 MG 24 hr tablet Take 1 tablet (25 mg total) by mouth daily.  . rosuvastatin (CRESTOR) 10 MG tablet Take 1 tablet (10 mg total) by mouth at bedtime.  Marland Kitchen warfarin (COUMADIN) 5 MG tablet Take 1 tablet (5 mg total) by mouth as  directed. Or as directed.   No Known Allergies Past Medical History:  Diagnosis Date  . Atrial fibrillation ([place])   . Chronic combined systolic and diastolic heart failure ([place])   . Chronic sinus complaints    overuses afrin  . Coronary atherosclerosis of native coronary artery   . Hyperlipidemia    Family History  Problem Relation Age of Onset  . COPD Father   . Emphysema Father   . Healthy Brother   . Diabetes Neg Hx   . Cancer Neg Hx    Past Surgical History:  Procedure Laterality Date  . BACK SURGERY  ~2014   disc repair  . CHOLECYSTECTOMY    . CORONARY ARTERY BYPASS GRAFTING (CABG) x five , using left internal mammary artery and right leg greater saphenous vein harvested endoscopically N/A 10/18/2016   Performed by Gaye Pollack, MD at [facility]  . FOOT SURGERY    . RIGHT/LEFT HEART CATH AND CORONARY ANGIOGRAPHY N/A 10/16/2016   Performed by Belva Crome, MD at [facility]  . stents in liver    . TRANSESOPHAGEAL ECHOCARDIOGRAM (TEE) N/A 10/18/2016   Performed by Gaye Pollack, MD at [facility] History   Socioeconomic History  . Marital status: Married    Spouse name: Not on file  . Number of children: 2  . Years of education: Not on file  .  Highest education level: Not on file  Social Needs  . Financial resource strain: Not on file  . Food insecurity - worry: Not on file  . Food insecurity - inability: Not on file  . Transportation needs - medical: Not on file  . Transportation needs - non-medical: Not on file  Occupational History  . Occupation: Web designer    Comment: Retired  Tobacco Use  . Smoking status: Former Research scientist (life sciences)  . Smokeless tobacco: Never Used  . Tobacco comment: Smoked on and off  Substance and Sexual Activity  . Alcohol use: No  . Drug use: No  . Sexual activity: Yes  Other Topics Concern  . Not on file  Social History Narrative   1 son   1 estranged daughter      Has living will   Wife is health care  POA--then son   Would accept resuscitation   No tube feeds if cognitively unaware     Review of Systems: General: negative for chills, fever, night sweats or weight changes.  Cardiovascular: negative for chest pain, dyspnea on exertion, edema, orthopnea, palpitations, paroxysmal nocturnal dyspnea or shortness of breath Dermatological: negative for rash Respiratory: negative for cough or wheezing Urologic: negative for hematuria Abdominal: negative for nausea, vomiting, diarrhea, bright red blood per rectum, melena, or hematemesis Neurologic: negative for visual changes, syncope, or dizziness All other systems reviewed and are otherwise negative except as noted above.   Physical Exam:  Blood pressure (!) 100/52, pulse 64, height 6\' 1"  (1.854 m), weight 200 lb (90.7 kg), SpO2 96 %.  General appearance: alert, cooperative and no distress Neck: no carotid bruit and no JVD Lungs: clear to auscultation bilaterally Heart: regular rate and rhythm, S1, S2 normal, no murmur, click, rub or gallop Extremities: 1+ bilateral ankle edema, L>R Pulses: 2+ and symmetric Skin: Skin color, texture, turgor normal. No rashes or lesions Neurologic: Grossly normal  EKG NSR 64 bpm  -- personally reviewed   ASSESSMENT AND PLAN:   1. Acute on Chronic Systolic HF: improved after addition of lasix. Good UOP, per pt. Weight is trending down. LEE improving. Exertional dyspnea resolved. He needs to continue lasix, still with mild bilateral ankle edema. BP is stable. We will check a f/u BMP today to ensure renal function and K are stable. If hypokalemic, we will need to add supplemental K. Pt instructed to notify our office if weight starts to go back up.   2. 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 Found to have multivessel CAD, now s/p CABG. He is on guidelines directed medical therapy for systolic HF w/ BB, ACE-I and digoxin. BP is soft after addition of diuretic. No room to increase meds at this time. Plan  is for f/u echo next month to reassess LVEF. If EF remains < 35%, he will need referral for ICD. He has f/u with Dr. Johnsie Cancel in Jan.   3. CAD: s/p CABG 09/2016. Stable w/o anginal symptoms. Continue ASA, BB and statin.   4. HTN: stable.   5. PAF: maintaining NSR w/ amiodarone. He is on coumadin for a/c. INRs are followed in our office.   6. HLD: LDL in August was 112 mg/dL. He is now on statin therapy with Crestor. Goal LDL is <70 mg/dL. Plan for f/u FLP and HFTs, scheduled 12/2. If not at goal, he will need further increase in Crestor dose. Currently at 10 mg.   Follow-Up scheduled with Dr. Johnsie Cancel in Jan.  [facility] Ladoris Gene, [facility] 01/07/2017  2:46 PM

## 2017-01-08 LAB — BASIC METABOLIC PANEL
BUN/Creatinine Ratio: 17 (ref 10–24)
BUN: 23 mg/dL (ref 8–27)
CALCIUM: 9.1 mg/dL (ref 8.6–10.2)
CHLORIDE: 103 mmol/L (ref 96–106)
CO2: 27 mmol/L (ref 20–29)
Creatinine, Ser: 1.33 mg/dL — ABNORMAL HIGH (ref 0.76–1.27)
GFR calc non Af Amer: 52 mL/min/{1.73_m2} — ABNORMAL LOW (ref >59)
GFR, EST AFRICAN AMERICAN: 60 mL/min/{1.73_m2} (ref >59)
Glucose: 100 mg/dL — ABNORMAL HIGH (ref 65–99)
POTASSIUM: 4.6 mmol/L (ref 3.5–5.2)
Sodium: 144 mmol/L (ref 134–144)

## 2017-01-08 NOTE — Addendum Note (Signed)
Addended by: De Burrs on: 01/08/2017 02:34 PM   Modules accepted: Orders

## 2017-01-09 ENCOUNTER — Encounter (HOSPITAL_COMMUNITY): Payer: Medicare Other

## 2017-01-14 ENCOUNTER — Encounter (HOSPITAL_COMMUNITY)
Admission: RE | Admit: 2017-01-14 | Discharge: 2017-01-14 | Disposition: A | Discharge status: Home / Self Care | Payer: Medicare Other | Source: Ambulatory Visit | Attending: Cardiovascular Disease | Admitting: Cardiovascular Disease

## 2017-01-14 ENCOUNTER — Telehealth: Payer: Self-pay | Admitting: Cardiovascular Disease

## 2017-01-14 DIAGNOSIS — Z951 Presence of aortocoronary bypass graft: Secondary | ICD-10-CM

## 2017-01-14 DIAGNOSIS — Z7982 Long term (current) use of aspirin: Secondary | ICD-10-CM | POA: Diagnosis not present

## 2017-01-14 DIAGNOSIS — I2510 Atherosclerotic heart disease of native coronary artery without angina pectoris: Secondary | ICD-10-CM | POA: Diagnosis not present

## 2017-01-14 DIAGNOSIS — I4891 Unspecified atrial fibrillation: Secondary | ICD-10-CM | POA: Diagnosis not present

## 2017-01-14 DIAGNOSIS — I5042 Chronic combined systolic (congestive) and diastolic (congestive) heart failure: Secondary | ICD-10-CM | POA: Diagnosis not present

## 2017-01-14 DIAGNOSIS — Z79899 Other long term (current) drug therapy: Secondary | ICD-10-CM | POA: Diagnosis not present

## 2017-01-14 NOTE — Telephone Encounter (Signed)
Returning a call about his lab results . Please call

## 2017-01-14 NOTE — Telephone Encounter (Signed)
Patient aware of lab results.

## 2017-01-14 NOTE — Telephone Encounter (Signed)
Notes recorded by Consuelo Pandy, PA-C on 01/09/2017 at 2:27 PM EST Slight bump in SCr after starting lasix, however K is normal. Recommend rechecking a BMP when he comes back in several weeks for fasting lipids.  Left message for patient to call back.

## 2017-01-16 ENCOUNTER — Encounter (HOSPITAL_COMMUNITY)
Admission: RE | Admit: 2017-01-16 | Discharge: 2017-01-16 | Disposition: A | Discharge status: Home / Self Care | Payer: Medicare Other | Source: Ambulatory Visit | Attending: Cardiovascular Disease | Admitting: Cardiovascular Disease

## 2017-01-16 DIAGNOSIS — I2510 Atherosclerotic heart disease of native coronary artery without angina pectoris: Secondary | ICD-10-CM | POA: Diagnosis not present

## 2017-01-16 DIAGNOSIS — I4891 Unspecified atrial fibrillation: Secondary | ICD-10-CM | POA: Diagnosis not present

## 2017-01-16 DIAGNOSIS — Z951 Presence of aortocoronary bypass graft: Secondary | ICD-10-CM | POA: Diagnosis not present

## 2017-01-16 DIAGNOSIS — Z79899 Other long term (current) drug therapy: Secondary | ICD-10-CM | POA: Diagnosis not present

## 2017-01-16 DIAGNOSIS — Z7982 Long term (current) use of aspirin: Secondary | ICD-10-CM | POA: Diagnosis not present

## 2017-01-16 DIAGNOSIS — I5042 Chronic combined systolic (congestive) and diastolic (congestive) heart failure: Secondary | ICD-10-CM | POA: Diagnosis not present

## 2017-01-18 ENCOUNTER — Encounter (HOSPITAL_COMMUNITY)
Admission: RE | Admit: 2017-01-18 | Discharge: 2017-01-18 | Disposition: A | Discharge status: Home / Self Care | Payer: Medicare Other | Source: Ambulatory Visit | Attending: Cardiovascular Disease | Admitting: Cardiovascular Disease

## 2017-01-18 DIAGNOSIS — I5042 Chronic combined systolic (congestive) and diastolic (congestive) heart failure: Secondary | ICD-10-CM | POA: Diagnosis not present

## 2017-01-18 DIAGNOSIS — Z7982 Long term (current) use of aspirin: Secondary | ICD-10-CM | POA: Diagnosis not present

## 2017-01-18 DIAGNOSIS — Z951 Presence of aortocoronary bypass graft: Secondary | ICD-10-CM

## 2017-01-18 DIAGNOSIS — I4891 Unspecified atrial fibrillation: Secondary | ICD-10-CM | POA: Diagnosis not present

## 2017-01-18 DIAGNOSIS — I2510 Atherosclerotic heart disease of native coronary artery without angina pectoris: Secondary | ICD-10-CM | POA: Diagnosis not present

## 2017-01-18 DIAGNOSIS — Z79899 Other long term (current) drug therapy: Secondary | ICD-10-CM | POA: Diagnosis not present

## 2017-01-18 NOTE — Progress Notes (Signed)
Cardiac Individual Treatment Plan  Patient Details  Name: Jason Day MRN: [id number] Date of Birth: October 03, 1940 Referring Provider:     [place] from 12/11/2016 in [place]  Referring Provider  Jenkins Rouge MD      Initial Encounter Date:    CARDIAC REHAB PHASE II ORIENTATION from 12/11/2016 in [place]  Date  12/11/16  Referring Provider  Jenkins Rouge MD      Visit Diagnosis: S/P CABG x 5  Patient's Home Medications on Admission:  Current Outpatient Medications:  .  amiodarone (PACERONE) 200 MG tablet, Take 1 tablet (200 mg total) daily by mouth., Disp: 30 tablet, Rfl: 6 .  aspirin EC 81 MG tablet, Take 81 mg by mouth daily., Disp: , Rfl:  .  digoxin (LANOXIN) 0.125 MG tablet, Take 0.5 tablets (0.0625 mg total) daily by mouth., Disp: 45 tablet, Rfl: 3 .  furosemide (LASIX) 20 MG tablet, Take 1 tablet (20 mg total) daily by mouth., Disp: 30 tablet, Rfl: 1 .  lisinopril (PRINIVIL,ZESTRIL) 10 MG tablet, Take 1 tablet (10 mg total) by mouth daily., Disp: 30 tablet, Rfl: 6 .  metoprolol succinate (TOPROL XL) 25 MG 24 hr tablet, Take 1 tablet (25 mg total) by mouth daily., Disp: 90 tablet, Rfl: 3 .  rosuvastatin (CRESTOR) 10 MG tablet, Take 1 tablet (10 mg total) by mouth at bedtime., Disp: 90 tablet, Rfl: 3 .  warfarin (COUMADIN) 5 MG tablet, Take 1 tablet (5 mg total) by mouth as directed. Or as directed., Disp: 30 tablet, Rfl: 2  Past Medical History: Past Medical History:  Diagnosis Date  . Atrial fibrillation ([place])   . Chronic combined systolic and diastolic heart failure ([place])   . Chronic sinus complaints    overuses afrin  . Coronary atherosclerosis of native coronary artery   . Hyperlipidemia     Tobacco Use: Social History   Tobacco Use  Smoking Status Former Smoker  Smokeless Tobacco Never Used  Tobacco Comment   Smoked on and off    Labs: Recent Review Scientist, physiological     Labs for ITP Cardiac and Pulmonary Rehab Latest Ref Rng & Units 10/18/2016 10/18/2016 10/18/2016 10/19/2016 10/20/2016   Cholestrol 0 - 200 mg/dL - - - - -   LDLCALC 0 - 99 mg/dL - - - - -   HDL >40 mg/dL - - - - -   Trlycerides <150 mg/dL - - - - -   Hemoglobin A1c 4.8 - 5.6 % - - - - -   PHART 7.350 - 7.450 7.354 - 7.332(L) - -   PCO2ART 32.0 - 48.0 mmHg 45.8 - 48.3(H) - -   HCO3 20.0 - 28.0 mmol/L 25.5 - 25.4 - -   TCO2 22 - 32 mmol/L 27 24 27 26  -   ACIDBASEDEF 0.0 - 2.0 mmol/L - - 1.0 - -   O2SAT % 95.0 - 93.0 73.0 70.2      Capillary Blood Glucose: Lab Results  Component Value Date   GLUCAP 176 (H) 10/19/2016   GLUCAP 101 (H) 10/19/2016   GLUCAP 98 10/19/2016   GLUCAP 115 (H) 10/19/2016   GLUCAP 134 (H) 10/18/2016     Exercise Target Goals:    Exercise Program Goal: Individual exercise prescription set with THRR, safety & activity barriers. Participant demonstrates ability to understand and report RPE using BORG scale, to self-measure pulse accurately, and to acknowledge the importance of the exercise prescription.  Exercise Prescription Goal: Starting with aerobic activity 30 plus minutes a day, 3 days per week for initial exercise prescription. Provide home exercise prescription and guidelines that participant acknowledges understanding prior to discharge.  Activity Barriers & Risk Stratification: Activity Barriers & Cardiac Risk Stratification - 12/11/16 1537      Activity Barriers & Cardiac Risk Stratification   Activity Barriers  Arthritis;Back Problems;Deconditioning;Muscular Weakness;Other (comment)    Comments  B foot discomfort    Cardiac Risk Stratification  High       6 Minute Walk: 6 Minute Walk    Row Name 12/11/16 1535         6 Minute Walk   Phase  Initial     Distance  1400 feet     Walk Time  6 minutes     # of Rest Breaks  0     MPH  2.65     METS  3.14     RPE  12     VO2 Peak  10.99     Symptoms  No     Resting HR  63 bpm      Resting BP  117/64     Resting Oxygen Saturation   99 %     Exercise Oxygen Saturation  during 6 min walk  98 %     Max Ex. HR  103 bpm     Max Ex. BP  160/64        Oxygen Initial Assessment:   Oxygen Re-Evaluation:   Oxygen Discharge (Final Oxygen Re-Evaluation):   Initial Exercise Prescription: Initial Exercise Prescription - 12/11/16 1500      Date of Initial Exercise RX and Referring Provider   Date  12/11/16    Referring Provider  Jenkins Rouge MD      Bike   Level  0.5    Minutes  10      NuStep   Level  3    SPM  70    Minutes  10    METs  2.5      Track   Laps  12    Minutes  10    METs  3.09      Prescription Details   Frequency (times per week)  3    Duration  Progress to 30 minutes of continuous aerobic without signs/symptoms of physical distress      Intensity   THRR 40-80% of Max Heartrate  58-116    Ratings of Perceived Exertion  11-13    Perceived Dyspnea  0-4      Progression   Progression  Continue to progress workloads to maintain intensity without signs/symptoms of physical distress.      Resistance Training   Training Prescription  Yes    Weight  3lbs    Reps  10-15       Perform Capillary Blood Glucose checks as needed.  Exercise Prescription Changes:  Exercise Prescription Changes    Row Name 12/17/16 1620 01/02/17 1510 01/14/17 1525         Response to Exercise   Blood Pressure (Admit)  124/58  130/78  104/60     Blood Pressure (Exercise)  160/66  124/70  124/64     Blood Pressure (Exit)  124/60  100/62  102/64     Heart Rate (Admit)  67 bpm  77 bpm  59 bpm     Heart Rate (Exercise)  102 bpm  95 bpm  97 bpm     Heart  Rate (Exit)  64 bpm  59 bpm  59 bpm     Symptoms  none  none  none     Comments  pt responded well to exercise session.  -  -     Duration  Continue with 30 min of aerobic exercise without signs/symptoms of physical distress.  Continue with 30 min of aerobic exercise without signs/symptoms of physical  distress.  Continue with 30 min of aerobic exercise without signs/symptoms of physical distress.     Intensity  THRR unchanged  THRR unchanged  THRR unchanged       Progression   Progression  Continue to progress workloads to maintain intensity without signs/symptoms of physical distress.  Continue to progress workloads to maintain intensity without signs/symptoms of physical distress.  Continue to progress workloads to maintain intensity without signs/symptoms of physical distress.     Average METs  2.5  3  3.2       Resistance Training   Training Prescription  Yes  No relaxation day  Yes relaxation day     Weight  3lbs  -  4lbs     Reps  10-15  -  10-15     Time  10 Minutes  -  10 Minutes       Bike   Level  0.5  1  1      Minutes  10  10  10        NuStep   Level  3  4  4      SPM  70  70  85     Minutes  10  10  10      METs  2  2.2  2.9       Track   Laps  14  16  15      Minutes  10  10  10      METs  3.43  3.79  3.6       Home Exercise Plan   Plans to continue exercise at  -  Home (comment) walking  Home (comment) walking     Frequency  -  Add 2 additional days to program exercise sessions.  Add 2 additional days to program exercise sessions.     Initial Home Exercises Provided  -  12/28/16  12/28/16        Exercise Comments:  Exercise Comments    Row Name 12/18/16 1627 12/28/16 1534 01/15/17 1527       Exercise Comments  Pt was oriented to exercise equipment today. Pt responded well to exercise session; will continue to monitor pt's activity levels.  Home exercise completed  Reviewed METs and goals. Pt is tolerating exercsie very well; will continue to monitor pt's progress and activity levels.         Exercise Goals and Review:  Exercise Goals    Row Name 12/11/16 1405             Exercise Goals   Increase Physical Activity  Yes       Intervention  Provide advice, education, support and counseling about physical activity/exercise needs.;Develop an  individualized exercise prescription for aerobic and resistive training based on initial evaluation findings, risk stratification, comorbidities and participant's personal goals.       Expected Outcomes  Achievement of increased cardiorespiratory fitness and enhanced flexibility, muscular endurance and strength shown through measurements of functional capacity and personal statement of participant.       Increase Strength and Stamina  Yes  Intervention  Provide advice, education, support and counseling about physical activity/exercise needs.;Develop an individualized exercise prescription for aerobic and resistive training based on initial evaluation findings, risk stratification, comorbidities and participant's personal goals.       Expected Outcomes  Achievement of increased cardiorespiratory fitness and enhanced flexibility, muscular endurance and strength shown through measurements of functional capacity and personal statement of participant.       Able to understand and use rate of perceived exertion (RPE) scale  Yes       Intervention  Provide education and explanation on how to use RPE scale       Expected Outcomes  Short Term: Able to use RPE daily in rehab to express subjective intensity level;Long Term:  Able to use RPE to guide intensity level when exercising independently       Knowledge and understanding of Target Heart Rate Range (THRR)  Yes       Intervention  Provide education and explanation of THRR including how the numbers were predicted and where they are located for reference       Expected Outcomes  Short Term: Able to state/look up THRR;Long Term: Able to use THRR to govern intensity when exercising independently;Short Term: Able to use daily as guideline for intensity in rehab       Able to check pulse independently  Yes       Intervention  Provide education and demonstration on how to check pulse in carotid and radial arteries.;Review the importance of being able to check  your own pulse for safety during independent exercise       Expected Outcomes  Short Term: Able to explain why pulse checking is important during independent exercise;Long Term: Able to check pulse independently and accurately       Understanding of Exercise Prescription  Yes       Intervention  Provide education, explanation, and written materials on patient's individual exercise prescription       Expected Outcomes  Short Term: Able to explain program exercise prescription;Long Term: Able to explain home exercise prescription to exercise independently          Exercise Goals Re-Evaluation : Exercise Goals Re-Evaluation    Row Name 12/18/16 1628 01/15/17 1527           Exercise Goal Re-Evaluation   Exercise Goals Review  Able to understand and use rate of perceived exertion (RPE) scale  Understanding of Exercise Prescription;Increase Physical Activity;Knowledge and understanding of Target Heart Rate Range (THRR);Increase Strength and Stamina;Able to understand and use rate of perceived exertion (RPE) scale;Able to check pulse independently      Comments  Pt demonstrated proper use of RPE scale with activity.  Pt is making great progress in cardiac rehab. Pt has increased in walking tolerance and exercise capacity.       Expected Outcomes  Pt will continue to improve in aerobic fitness  Pt will continue to improve in aerobic fitness          Discharge Exercise Prescription (Final Exercise Prescription Changes): Exercise Prescription Changes - 01/14/17 1525      Response to Exercise   Blood Pressure (Admit)  104/60    Blood Pressure (Exercise)  124/64    Blood Pressure (Exit)  102/64    Heart Rate (Admit)  59 bpm    Heart Rate (Exercise)  97 bpm    Heart Rate (Exit)  59 bpm    Symptoms  none    Duration  Continue with 30  min of aerobic exercise without signs/symptoms of physical distress.    Intensity  THRR unchanged      Progression   Progression  Continue to progress workloads  to maintain intensity without signs/symptoms of physical distress.    Average METs  3.2      Resistance Training   Training Prescription  Yes relaxation day    Weight  4lbs    Reps  10-15    Time  10 Minutes      Bike   Level  1    Minutes  10      NuStep   Level  4    SPM  85    Minutes  10    METs  2.9      Track   Laps  15    Minutes  10    METs  3.6      Home Exercise Plan   Plans to continue exercise at  Home (comment) walking    Frequency  Add 2 additional days to program exercise sessions.    Initial Home Exercises Provided  12/28/16       Nutrition:  Target Goals: Understanding of nutrition guidelines, daily intake of sodium 1500mg , cholesterol 200mg , calories 30% from fat and 7% or less from saturated fats, daily to have 5 or more servings of fruits and vegetables.  Biometrics: Pre Biometrics - 12/11/16 1537      Pre Biometrics   Waist Circumference  40.5 inches    Hip Circumference  42 inches    Waist to Hip Ratio  0.96 %    Triceps Skinfold  19 mm    % Body Fat  28.5 %    Grip Strength  34 kg    Flexibility  0 in    Single Leg Stand  23 seconds        Nutrition Therapy Plan and Nutrition Goals: Nutrition Therapy & Goals - 12/12/16 0844      Nutrition Therapy   Diet  Therapeutic Lifestyle Change      Personal Nutrition Goals   Nutrition Goal  Pt to identify and limit food sources of saturated fat, trans fat, and sodium      Intervention Plan   Intervention  Prescribe, educate and counsel regarding individualized specific dietary modifications aiming towards targeted core components such as weight, hypertension, lipid management, diabetes, heart failure and other comorbidities.    Expected Outcomes  Short Term Goal: Understand basic principles of dietary content, such as calories, fat, sodium, cholesterol and nutrients.;Long Term Goal: Adherence to prescribed nutrition plan.       Nutrition Discharge: Nutrition Scores: Nutrition Assessments  - 12/12/16 0838      MEDFICTS Scores   Pre Score  50       Nutrition Goals Re-Evaluation:   Nutrition Goals Re-Evaluation:   Nutrition Goals Discharge (Final Nutrition Goals Re-Evaluation):   Psychosocial: Target Goals: Acknowledge presence or absence of significant depression and/or stress, maximize coping skills, provide positive support system. Participant is able to verbalize types and ability to use techniques and skills needed for reducing stress and depression.  Initial Review & Psychosocial Screening: Initial Psych Review & Screening - 12/11/16 1620      Initial Review   Current issues with  None Identified      Family Dynamics   Good Support System?  Yes      Barriers   Psychosocial barriers to participate in program  The patient should benefit from training in stress management  and relaxation.      Screening Interventions   Interventions  Encouraged to exercise       Quality of Life Scores: Quality of Life - 12/11/16 1407      Quality of Life Scores   Health/Function Pre  30 %    Socioeconomic Pre  30 %    Psych/Spiritual Pre  30 %    Family Pre  30 %    GLOBAL Pre  30 %       PHQ-9: Recent Review Flowsheet Data    Depression screen [facility] 2/9 12/17/2016 11/02/2016   Decreased Interest 0 0   Down, Depressed, Hopeless 0 0   PHQ - 2 Score 0 0     Interpretation of Total Score  Total Score Depression Severity:  1-4 = Minimal depression, 5-9 = Mild depression, 10-14 = Moderate depression, 15-19 = Moderately severe depression, 20-27 = Severe depression   Psychosocial Evaluation and Intervention: Psychosocial Evaluation - 12/18/16 1413      Psychosocial Evaluation & Interventions   Interventions  Encouraged to exercise with the program and follow exercise prescription;Stress management education;Relaxation education    Comments  Pt has no psychosocial needs at this time, pt feels supported in his rehab participation.  QOL scores show no isssues or  areas of concern.    Expected Outcomes  Pt will continue to display positive and healthy coping skills.    Continue Psychosocial Services   No Follow up required       Psychosocial Re-Evaluation: Psychosocial Re-Evaluation    [place] Name 12/18/16 1414 01/18/17 0941           Psychosocial Re-Evaluation   Comments  Pt with QOL scores at 30 for each component.  Pt denies any needs moved here from [place] and is settling into his new home  Pt with QOL scores at 30 for each component.  Pt denies any needs moved here from [place] and is settling into his new home      Interventions  Relaxation education;Encouraged to attend Cardiac Rehabilitation for the exercise;Stress management education  Relaxation education;Encouraged to attend Cardiac Rehabilitation for the exercise;Stress management education      Continue Psychosocial Services   No Follow up required  No Follow up required      Comments  moved to the area and is unpacking and renovating his new home  moved to the area and is unpacking and renovating his new home        Initial Review   Source of Stress Concerns  None Identified  None Identified         Psychosocial Discharge (Final Psychosocial Re-Evaluation): Psychosocial Re-Evaluation - 01/18/17 0941      Psychosocial Re-Evaluation   Comments  Pt with QOL scores at 30 for each component.  Pt denies any needs moved here from [place] and is settling into his new home    Interventions  Relaxation education;Encouraged to attend Cardiac Rehabilitation for the exercise;Stress management education    Continue Psychosocial Services   No Follow up required    Comments  moved to the area and is unpacking and renovating his new home      Initial Review   Source of Stress Concerns  None Identified       Vocational Rehabilitation: Provide vocational rehab assistance to qualifying candidates.   Vocational Rehab Evaluation & Intervention: Vocational Rehab - 12/11/16 1620      Initial  Vocational Rehab Evaluation & Intervention   Assessment shows need for  Vocational Rehabilitation  No       Education: Education Goals: Education classes will be provided on a weekly basis, covering required topics. Participant will state understanding/return demonstration of topics presented.  Learning Barriers/Preferences: Learning Barriers/Preferences - 12/11/16 1405      Learning Barriers/Preferences   Learning Barriers  Sight    Learning Preferences  Skilled Demonstration       Education Topics: Count Your Pulse:  -Group instruction provided by verbal instruction, demonstration, patient participation and written materials to support subject.  Instructors address importance of being able to find your pulse and how to count your pulse when at home without a heart monitor.  Patients get hands on experience counting their pulse with staff help and individually.   CARDIAC REHAB PHASE II EXERCISE from 12/28/2016 in [place]  Date  12/21/16  Instruction Review Code  2- meets goals/outcomes      Heart Attack, Angina, and Risk Factor Modification:  -Group instruction provided by verbal instruction, video, and written materials to support subject.  Instructors address signs and symptoms of angina and heart attacks.    Also discuss risk factors for heart disease and how to make changes to improve heart health risk factors.   CARDIAC REHAB PHASE II EXERCISE from 12/28/2016 in [place]  Date  12/19/16  Instruction Review Code  2- meets goals/outcomes      Functional Fitness:  -Group instruction provided by verbal instruction, demonstration, patient participation, and written materials to support subject.  Instructors address safety measures for doing things around the house.  Discuss how to get up and down off the floor, how to pick things up properly, how to safely get out of a chair without assistance, and balance  training.   Meditation and Mindfulness:  -Group instruction provided by verbal instruction, patient participation, and written materials to support subject.  Instructor addresses importance of mindfulness and meditation practice to help reduce stress and improve awareness.  Instructor also leads participants through a meditation exercise.    Stretching for Flexibility and Mobility:  -Group instruction provided by verbal instruction, patient participation, and written materials to support subject.  Instructors lead participants through series of stretches that are designed to increase flexibility thus improving mobility.  These stretches are additional exercise for major muscle groups that are typically performed during regular warm up and cool down.   Hands Only CPR:  -Group verbal, video, and participation provides a basic overview of AHA guidelines for community CPR. Role-play of emergencies allow participants the opportunity to practice calling for help and chest compression technique with discussion of AED use.   Hypertension: -Group verbal and written instruction that provides a basic overview of hypertension including the most recent diagnostic guidelines, risk factor reduction with self-care instructions and medication management.    Nutrition I class: Heart Healthy Eating:  -Group instruction provided by PowerPoint slides, verbal discussion, and written materials to support subject matter. The instructor gives an explanation and review of the Therapeutic Lifestyle Changes diet recommendations, which includes a discussion on lipid goals, dietary fat, sodium, fiber, plant stanol/sterol esters, sugar, and the components of a well-balanced, healthy diet.   Nutrition II class: Lifestyle Skills:  -Group instruction provided by PowerPoint slides, verbal discussion, and written materials to support subject matter. The instructor gives an explanation and review of label reading, grocery  shopping for heart health, heart healthy recipe modifications, and ways to make healthier choices when eating out.  Diabetes Question & Answer:  -Group instruction provided by PowerPoint slides, verbal discussion, and written materials to support subject matter. The instructor gives an explanation and review of diabetes co-morbidities, pre- and post-prandial blood glucose goals, pre-exercise blood glucose goals, signs, symptoms, and treatment of hypoglycemia and hyperglycemia, and foot care basics.   Diabetes Blitz:  -Group instruction provided by PowerPoint slides, verbal discussion, and written materials to support subject matter. The instructor gives an explanation and review of the physiology behind type 1 and type 2 diabetes, diabetes medications and rational behind using different medications, pre- and post-prandial blood glucose recommendations and Hemoglobin A1c goals, diabetes diet, and exercise including blood glucose guidelines for exercising safely.    Portion Distortion:  -Group instruction provided by PowerPoint slides, verbal discussion, written materials, and food models to support subject matter. The instructor gives an explanation of serving size versus portion size, changes in portions sizes over the last 20 years, and what consists of a serving from each food group.   Stress Management:  -Group instruction provided by verbal instruction, video, and written materials to support subject matter.  Instructors review role of stress in heart disease and how to cope with stress positively.     Exercising on Your Own:  -Group instruction provided by verbal instruction, power point, and written materials to support subject.  Instructors discuss benefits of exercise, components of exercise, frequency and intensity of exercise, and end points for exercise.  Also discuss use of nitroglycerin and activating EMS.  Review options of places to exercise outside of rehab.  Review guidelines  for sex with heart disease.   CARDIAC REHAB PHASE II EXERCISE from 12/28/2016 in [place]  Date  12/26/16  Instruction Review Code  2- meets goals/outcomes      Cardiac Drugs I:  -Group instruction provided by verbal instruction and written materials to support subject.  Instructor reviews cardiac drug classes: antiplatelets, anticoagulants, beta blockers, and statins.  Instructor discusses reasons, side effects, and lifestyle considerations for each drug class.   Cardiac Drugs II:  -Group instruction provided by verbal instruction and written materials to support subject.  Instructor reviews cardiac drug classes: angiotensin converting enzyme inhibitors (ACE-I), angiotensin II receptor blockers (ARBs), nitrates, and calcium channel blockers.  Instructor discusses reasons, side effects, and lifestyle considerations for each drug class.   Anatomy and Physiology of the Circulatory System:  Group verbal and written instruction and models provide basic cardiac anatomy and physiology, with the coronary electrical and arterial systems. Review of: AMI, Angina, Valve disease, Heart Failure, Peripheral Artery Disease, Cardiac Arrhythmia, Pacemakers, and the ICD.   Other Education:  -Group or individual verbal, written, or video instructions that support the educational goals of the cardiac rehab program.   CARDIAC REHAB PHASE II EXERCISE from 12/28/2016 in [place]  Date  12/28/16 [Holiday Eating Survival Tips]  Educator  RD  Instruction Review Code  2- Demonstrated Understanding      Knowledge Questionnaire Score: Knowledge Questionnaire Score - 01/01/17 1655      Knowledge Questionnaire Score   Pre Score  21/24       Core Components/Risk Factors/Patient Goals at Admission: Personal Goals and Risk Factors at Admission - 12/11/16 1538      Core Components/Risk Factors/Patient Goals on Admission   Heart Failure  Yes     Intervention  Provide a combined exercise and nutrition program that is supplemented with education, support and counseling about heart failure. Directed  toward relieving symptoms such as shortness of breath, decreased exercise tolerance, and extremity edema.    Expected Outcomes  Improve functional capacity of life;Short term: Attendance in program 2-3 days a week with increased exercise capacity. Reported lower sodium intake. Reported increased fruit and vegetable intake. Reports medication compliance.;Short term: Daily weights obtained and reported for increase. Utilizing diuretic protocols set by physician.;Long term: Adoption of self-care skills and reduction of barriers for early signs and symptoms recognition and intervention leading to self-care maintenance.    Lipids  Yes    Intervention  Provide education and support for participant on nutrition & aerobic/resistive exercise along with prescribed medications to achieve LDL 70mg , HDL >40mg .    Expected Outcomes  Short Term: Participant states understanding of desired cholesterol values and is compliant with medications prescribed. Participant is following exercise prescription and nutrition guidelines.;Long Term: Cholesterol controlled with medications as prescribed, with individualized exercise RX and with personalized nutrition plan. Value goals: LDL < 70mg , HDL > 40 mg.       Core Components/Risk Factors/Patient Goals Review:  Goals and Risk Factor Review    Row Name 12/18/16 1410 01/18/17 0941           Core Components/Risk Factors/Patient Goals Review   Personal Goals Review  Heart Failure;Lipids  Heart Failure;Lipids      Review  Pt began participating in cardiac rehab this week. and is off to a great start.  Pt is eager to learn more about risk factor modification for CAD.  Encourage pt to participate in education and exercise classes.   Pt is eager to learn more about risk factor modification for CAD.  Encourage pt to participate in  education and exercise classes.      Expected Outcomes  Pt will understand the signs and symptoms of acute heart failure, observe dietary restrictions, weigh daily and when to contact the MD office.  Pt will have lipid panel readings within normal limit and observe dietary intake for heart healthy die.  Pt will understand the signs and symptoms of acute heart failure, observe dietary restrictions, weigh daily and when to contact the MD office.  Pt will have lipid panel readings within normal limit and observe dietary intake for heart healthy die.         Core Components/Risk Factors/Patient Goals at Discharge (Final Review):  Goals and Risk Factor Review - 01/18/17 0941      Core Components/Risk Factors/Patient Goals Review   Personal Goals Review  Heart Failure;Lipids    Review   Pt is eager to learn more about risk factor modification for CAD.  Encourage pt to participate in education and exercise classes.    Expected Outcomes  Pt will understand the signs and symptoms of acute heart failure, observe dietary restrictions, weigh daily and when to contact the MD office.  Pt will have lipid panel readings within normal limit and observe dietary intake for heart healthy die.       ITP Comments: ITP Comments    Row Name 12/11/16 1359 12/21/16 0843 01/18/17 0944       ITP Comments  Medical Director, Dr. Fransico Him  Medical Director, Dr. Fransico Him  30 Day ITP Review. Marissa has good particpation and attendance at cardiac rehab. Tylik's vital signs and weights have been stable.        Comments: See ITP comments.Barnet Pall, RN,BSN 01/18/2017 9:47 AM

## 2017-01-21 ENCOUNTER — Other Ambulatory Visit: Payer: Medicare Other | Admitting: *Deleted

## 2017-01-21 ENCOUNTER — Ambulatory Visit (INDEPENDENT_AMBULATORY_CARE_PROVIDER_SITE_OTHER): Payer: Medicare Other | Admitting: *Deleted

## 2017-01-21 ENCOUNTER — Encounter (HOSPITAL_COMMUNITY)
Admission: RE | Admit: 2017-01-21 | Discharge: 2017-01-21 | Disposition: A | Discharge status: Home / Self Care | Payer: Medicare Other | Source: Ambulatory Visit | Attending: Cardiovascular Disease | Admitting: Cardiovascular Disease

## 2017-01-21 DIAGNOSIS — I4891 Unspecified atrial fibrillation: Secondary | ICD-10-CM | POA: Diagnosis not present

## 2017-01-21 DIAGNOSIS — E785 Hyperlipidemia, unspecified: Secondary | ICD-10-CM | POA: Insufficient documentation

## 2017-01-21 DIAGNOSIS — Z7901 Long term (current) use of anticoagulants: Secondary | ICD-10-CM | POA: Insufficient documentation

## 2017-01-21 DIAGNOSIS — I5042 Chronic combined systolic (congestive) and diastolic (congestive) heart failure: Secondary | ICD-10-CM | POA: Insufficient documentation

## 2017-01-21 DIAGNOSIS — Z79899 Other long term (current) drug therapy: Secondary | ICD-10-CM | POA: Diagnosis not present

## 2017-01-21 DIAGNOSIS — Z951 Presence of aortocoronary bypass graft: Secondary | ICD-10-CM

## 2017-01-21 DIAGNOSIS — M7989 Other specified soft tissue disorders: Secondary | ICD-10-CM

## 2017-01-21 DIAGNOSIS — Z5181 Encounter for therapeutic drug level monitoring: Secondary | ICD-10-CM

## 2017-01-21 DIAGNOSIS — Z7982 Long term (current) use of aspirin: Secondary | ICD-10-CM | POA: Diagnosis not present

## 2017-01-21 DIAGNOSIS — I2510 Atherosclerotic heart disease of native coronary artery without angina pectoris: Secondary | ICD-10-CM | POA: Diagnosis not present

## 2017-01-21 DIAGNOSIS — R0602 Shortness of breath: Secondary | ICD-10-CM

## 2017-01-21 LAB — HEPATIC FUNCTION PANEL
ALT: 24 IU/L (ref 0–44)
AST: 28 IU/L (ref 0–40)
Albumin: 4.5 g/dL (ref 3.5–4.8)
Alkaline Phosphatase: 84 IU/L (ref 39–117)
BILIRUBIN TOTAL: 2.3 mg/dL — AB (ref 0.0–1.2)
BILIRUBIN, DIRECT: 0.59 mg/dL — AB (ref 0.00–0.40)
Total Protein: 6.5 g/dL (ref 6.0–8.5)

## 2017-01-21 LAB — LIPID PANEL
CHOLESTEROL TOTAL: 132 mg/dL (ref 100–199)
Chol/HDL Ratio: 3.5 ratio (ref 0.0–5.0)
HDL: 38 mg/dL — ABNORMAL LOW (ref >39)
LDL Calculated: 82 mg/dL (ref 0–99)
TRIGLYCERIDES: 58 mg/dL (ref 0–149)
VLDL Cholesterol Cal: 12 mg/dL (ref 5–40)

## 2017-01-21 LAB — BASIC METABOLIC PANEL
BUN/Creatinine Ratio: 19 (ref 10–24)
BUN: 29 mg/dL — AB (ref 8–27)
CO2: 24 mmol/L (ref 20–29)
Calcium: 9 mg/dL (ref 8.6–10.2)
Chloride: 101 mmol/L (ref 96–106)
Creatinine, Ser: 1.54 mg/dL — ABNORMAL HIGH (ref 0.76–1.27)
GFR calc non Af Amer: 43 mL/min/{1.73_m2} — ABNORMAL LOW (ref >59)
GFR, EST AFRICAN AMERICAN: 50 mL/min/{1.73_m2} — AB (ref >59)
Glucose: 103 mg/dL — ABNORMAL HIGH (ref 65–99)
POTASSIUM: 4.1 mmol/L (ref 3.5–5.2)
Sodium: 140 mmol/L (ref 134–144)

## 2017-01-21 LAB — POCT INR: INR: 2.9

## 2017-01-21 NOTE — Patient Instructions (Signed)
Continue on same dosage 1/2 tablet daily except 1 tablet on Wednesdays and Saturdays. Recheck in 4 weeks. [facility]#[phone] Main [phone]

## 2017-01-23 ENCOUNTER — Encounter (HOSPITAL_COMMUNITY)
Admission: RE | Admit: 2017-01-23 | Discharge: 2017-01-23 | Disposition: A | Discharge status: Home / Self Care | Payer: Medicare Other | Source: Ambulatory Visit | Attending: Cardiovascular Disease | Admitting: Cardiovascular Disease

## 2017-01-23 ENCOUNTER — Other Ambulatory Visit: Payer: Self-pay | Admitting: Cardiovascular Disease

## 2017-01-23 ENCOUNTER — Telehealth: Payer: Self-pay

## 2017-01-23 DIAGNOSIS — I4891 Unspecified atrial fibrillation: Secondary | ICD-10-CM | POA: Diagnosis not present

## 2017-01-23 DIAGNOSIS — R17 Unspecified jaundice: Secondary | ICD-10-CM

## 2017-01-23 DIAGNOSIS — I5042 Chronic combined systolic (congestive) and diastolic (congestive) heart failure: Secondary | ICD-10-CM | POA: Diagnosis not present

## 2017-01-23 DIAGNOSIS — Z7982 Long term (current) use of aspirin: Secondary | ICD-10-CM | POA: Diagnosis not present

## 2017-01-23 DIAGNOSIS — Z951 Presence of aortocoronary bypass graft: Secondary | ICD-10-CM

## 2017-01-23 DIAGNOSIS — I2510 Atherosclerotic heart disease of native coronary artery without angina pectoris: Secondary | ICD-10-CM | POA: Diagnosis not present

## 2017-01-23 DIAGNOSIS — Z79899 Other long term (current) drug therapy: Secondary | ICD-10-CM | POA: Diagnosis not present

## 2017-01-23 NOTE — Telephone Encounter (Signed)
-----   Message from Josue Hector, MD sent at 01/23/2017  7:32 AM EST ----- Refer him to GI for elevated bilirubin for now

## 2017-01-23 NOTE — Telephone Encounter (Signed)
Patient aware of Dr. Nishan's recommendations. 

## 2017-01-24 ENCOUNTER — Encounter: Payer: Self-pay | Admitting: Gastroenterology

## 2017-01-24 NOTE — Telephone Encounter (Signed)
Informed wife that I spoke w/ Nishan's nurse, [place] that pt only needs to get his potassium through his diet, no need for tablet form supplementation at this time.  She thanks me for helping and agreeable to plan. She also informs me that they have already spoken with PCP & GI doctors in [place] and are going down to see them next month.  She will call the office if they need to be referred to a local GI doctor at any point. She is appreciative of all my help.

## 2017-01-24 NOTE — Telephone Encounter (Signed)
Follow up    Patient and his wife calling to get understanding for GI referral.  Wife states she does not understand why patient needs to see GI physician. Also has question why patient needs to take Potassium. Please call  Pt c/o medication issue:  1. Name of Medication: Potassium 2. How are you currently taking this medication (dosage and times per day)? n/a  3. Are you having a reaction (difficulty breathing--STAT)? NO  4. What is your medication issue? Does not know the reason for taking potassium and dosage clarification needed.

## 2017-01-24 NOTE — Telephone Encounter (Signed)
Called and spoke to wife per pt request. Wife asking about potassium supplementation and GI referral.  Clarified why GI referral was made by informing wife bilirubin levels elevated.  Wife explains that pt has had liver stent and followed by GI physician in [place]. Will fax result to Dr. Andree Coss in [place].  She is going to follow up with their office and if Dr. Junius Roads feels patient needs to be seen locally then she let us know to place referral for local doctor.  She also states that the nurse she spoke with Tuesday told her that pt needed to start potassium supplementation.  Informed wife that I cannot find anything stating that.  Informed that pt's potassium level is normal.  Told wife that I would discuss this with nurse, Pam, and call her back once I could give her clarification of why pt needs to start this medication.  Wife appreciates me taking the time to speak with her.  She understands I will call her once reviewed w/ nurse Pam.

## 2017-01-25 ENCOUNTER — Encounter (HOSPITAL_COMMUNITY)
Admission: RE | Admit: 2017-01-25 | Discharge: 2017-01-25 | Disposition: A | Discharge status: Home / Self Care | Payer: Medicare Other | Source: Ambulatory Visit | Attending: Cardiovascular Disease | Admitting: Cardiovascular Disease

## 2017-01-25 DIAGNOSIS — I5042 Chronic combined systolic (congestive) and diastolic (congestive) heart failure: Secondary | ICD-10-CM | POA: Diagnosis not present

## 2017-01-25 DIAGNOSIS — I2510 Atherosclerotic heart disease of native coronary artery without angina pectoris: Secondary | ICD-10-CM | POA: Diagnosis not present

## 2017-01-25 DIAGNOSIS — Z7982 Long term (current) use of aspirin: Secondary | ICD-10-CM | POA: Diagnosis not present

## 2017-01-25 DIAGNOSIS — I4891 Unspecified atrial fibrillation: Secondary | ICD-10-CM | POA: Diagnosis not present

## 2017-01-25 DIAGNOSIS — Z79899 Other long term (current) drug therapy: Secondary | ICD-10-CM | POA: Diagnosis not present

## 2017-01-25 DIAGNOSIS — Z951 Presence of aortocoronary bypass graft: Secondary | ICD-10-CM | POA: Diagnosis not present

## 2017-01-28 ENCOUNTER — Encounter (HOSPITAL_COMMUNITY): Payer: Medicare Other

## 2017-01-30 ENCOUNTER — Encounter (HOSPITAL_COMMUNITY): Payer: Medicare Other

## 2017-02-01 ENCOUNTER — Encounter (HOSPITAL_COMMUNITY)
Admission: RE | Admit: 2017-02-01 | Discharge: 2017-02-01 | Disposition: A | Discharge status: Home / Self Care | Payer: Medicare Other | Source: Ambulatory Visit | Attending: Cardiovascular Disease | Admitting: Cardiovascular Disease

## 2017-02-01 DIAGNOSIS — I5042 Chronic combined systolic (congestive) and diastolic (congestive) heart failure: Secondary | ICD-10-CM | POA: Diagnosis not present

## 2017-02-01 DIAGNOSIS — I2510 Atherosclerotic heart disease of native coronary artery without angina pectoris: Secondary | ICD-10-CM | POA: Diagnosis not present

## 2017-02-01 DIAGNOSIS — Z951 Presence of aortocoronary bypass graft: Secondary | ICD-10-CM | POA: Diagnosis not present

## 2017-02-01 DIAGNOSIS — Z79899 Other long term (current) drug therapy: Secondary | ICD-10-CM | POA: Diagnosis not present

## 2017-02-01 DIAGNOSIS — Z7982 Long term (current) use of aspirin: Secondary | ICD-10-CM | POA: Diagnosis not present

## 2017-02-01 DIAGNOSIS — I4891 Unspecified atrial fibrillation: Secondary | ICD-10-CM | POA: Diagnosis not present

## 2017-02-04 ENCOUNTER — Encounter (HOSPITAL_COMMUNITY)
Admission: RE | Admit: 2017-02-04 | Discharge: 2017-02-04 | Disposition: A | Discharge status: Home / Self Care | Payer: Medicare Other | Source: Ambulatory Visit | Attending: Cardiovascular Disease | Admitting: Cardiovascular Disease

## 2017-02-04 DIAGNOSIS — Z951 Presence of aortocoronary bypass graft: Secondary | ICD-10-CM

## 2017-02-04 DIAGNOSIS — Z7982 Long term (current) use of aspirin: Secondary | ICD-10-CM | POA: Diagnosis not present

## 2017-02-04 DIAGNOSIS — Z79899 Other long term (current) drug therapy: Secondary | ICD-10-CM | POA: Diagnosis not present

## 2017-02-04 DIAGNOSIS — I5042 Chronic combined systolic (congestive) and diastolic (congestive) heart failure: Secondary | ICD-10-CM | POA: Diagnosis not present

## 2017-02-04 DIAGNOSIS — I4891 Unspecified atrial fibrillation: Secondary | ICD-10-CM | POA: Diagnosis not present

## 2017-02-04 DIAGNOSIS — I2510 Atherosclerotic heart disease of native coronary artery without angina pectoris: Secondary | ICD-10-CM | POA: Diagnosis not present

## 2017-02-06 ENCOUNTER — Encounter (HOSPITAL_COMMUNITY)
Admission: RE | Admit: 2017-02-06 | Discharge: 2017-02-06 | Disposition: A | Discharge status: Home / Self Care | Payer: Medicare Other | Source: Ambulatory Visit | Attending: Cardiovascular Disease | Admitting: Cardiovascular Disease

## 2017-02-06 ENCOUNTER — Telehealth: Payer: Self-pay | Admitting: Physician Assistant

## 2017-02-06 DIAGNOSIS — Z951 Presence of aortocoronary bypass graft: Secondary | ICD-10-CM | POA: Diagnosis not present

## 2017-02-06 DIAGNOSIS — I4891 Unspecified atrial fibrillation: Secondary | ICD-10-CM | POA: Diagnosis not present

## 2017-02-06 DIAGNOSIS — Z79899 Other long term (current) drug therapy: Secondary | ICD-10-CM | POA: Diagnosis not present

## 2017-02-06 DIAGNOSIS — I5042 Chronic combined systolic (congestive) and diastolic (congestive) heart failure: Secondary | ICD-10-CM | POA: Diagnosis not present

## 2017-02-06 DIAGNOSIS — Z7982 Long term (current) use of aspirin: Secondary | ICD-10-CM | POA: Diagnosis not present

## 2017-02-06 DIAGNOSIS — I2510 Atherosclerotic heart disease of native coronary artery without angina pectoris: Secondary | ICD-10-CM | POA: Diagnosis not present

## 2017-02-06 NOTE — Telephone Encounter (Signed)
Paged by cardiac rehab.  Nurse has noted trace bilateral lower extremity edema.  Patient denies any shortness of breath, chest pain, palpitation, melena or blood in his stool or urine.  Intermittent orthopnea.  He has noted intermittent weight gain up to 3 pounds and then trending back to normal next day.  Advised to take extra 20 mg of Lasix tonight.  He may take an additional Lasix 20 mg for weight gain of 3 pounds overnight.  He will give [place] a call if this becomes daily issue.

## 2017-02-06 NOTE — Progress Notes (Signed)
Jason Day reports feeling short of breath when he walks up and down the stairs and when he exerts himself. Upon assessment lung fields essentially clear. Oxygen saturation 98% on room air. Trace bilateral lower extremity edema noted. Jason Day says he feels like he is retaining fluid.Vin Bhagat PA paged and notified. Vin talked with Jason Day over the phone and told him to take an extra furosemide today. Jason Lis PA also told [name] to take an extra furosemide if he gains more than 3 pounds in one day and to call Dr Kyla Balzarine office if takes more than 2 furosemide in one week. Patient takes understanding.Barnet Pall, RN,BSN 02/06/2017 1:54 PM

## 2017-02-08 ENCOUNTER — Encounter (HOSPITAL_COMMUNITY)
Admission: RE | Admit: 2017-02-08 | Discharge: 2017-02-08 | Disposition: A | Discharge status: Home / Self Care | Payer: Medicare Other | Source: Ambulatory Visit | Attending: Cardiovascular Disease | Admitting: Cardiovascular Disease

## 2017-02-08 DIAGNOSIS — Z951 Presence of aortocoronary bypass graft: Secondary | ICD-10-CM | POA: Diagnosis not present

## 2017-02-08 DIAGNOSIS — I4891 Unspecified atrial fibrillation: Secondary | ICD-10-CM | POA: Diagnosis not present

## 2017-02-08 DIAGNOSIS — Z7982 Long term (current) use of aspirin: Secondary | ICD-10-CM | POA: Diagnosis not present

## 2017-02-08 DIAGNOSIS — Z79899 Other long term (current) drug therapy: Secondary | ICD-10-CM | POA: Diagnosis not present

## 2017-02-08 DIAGNOSIS — I2510 Atherosclerotic heart disease of native coronary artery without angina pectoris: Secondary | ICD-10-CM | POA: Diagnosis not present

## 2017-02-08 DIAGNOSIS — I5042 Chronic combined systolic (congestive) and diastolic (congestive) heart failure: Secondary | ICD-10-CM | POA: Diagnosis not present

## 2017-02-11 ENCOUNTER — Encounter (HOSPITAL_COMMUNITY): Payer: Medicare Other

## 2017-02-13 ENCOUNTER — Encounter (HOSPITAL_COMMUNITY)
Admission: RE | Admit: 2017-02-13 | Discharge: 2017-02-13 | Disposition: A | Discharge status: Home / Self Care | Payer: Medicare Other | Source: Ambulatory Visit | Attending: Cardiovascular Disease | Admitting: Cardiovascular Disease

## 2017-02-13 DIAGNOSIS — I5042 Chronic combined systolic (congestive) and diastolic (congestive) heart failure: Secondary | ICD-10-CM | POA: Diagnosis not present

## 2017-02-13 DIAGNOSIS — Z79899 Other long term (current) drug therapy: Secondary | ICD-10-CM | POA: Diagnosis not present

## 2017-02-13 DIAGNOSIS — I4891 Unspecified atrial fibrillation: Secondary | ICD-10-CM | POA: Diagnosis not present

## 2017-02-13 DIAGNOSIS — Z951 Presence of aortocoronary bypass graft: Secondary | ICD-10-CM

## 2017-02-13 DIAGNOSIS — I2510 Atherosclerotic heart disease of native coronary artery without angina pectoris: Secondary | ICD-10-CM | POA: Diagnosis not present

## 2017-02-13 DIAGNOSIS — Z7982 Long term (current) use of aspirin: Secondary | ICD-10-CM | POA: Diagnosis not present

## 2017-02-14 NOTE — Progress Notes (Signed)
Cardiac Individual Treatment Plan  Patient Details  Name: Jason Day MRN: [id number] Date of Birth: 1940/03/10 Referring Provider:     [facility] from 12/11/2016 in [place]  Referring Provider  Jenkins Rouge MD      Initial Encounter Date:    CARDIAC REHAB PHASE II ORIENTATION from 12/11/2016 in [place]  Date  12/11/16  Referring Provider  Jenkins Rouge MD      Visit Diagnosis: S/P CABG x 5  Patient's Home Medications on Admission:  Current Outpatient Medications:  .  amiodarone (PACERONE) 200 MG tablet, Take 1 tablet (200 mg total) daily by mouth., Disp: 30 tablet, Rfl: 6 .  aspirin EC 81 MG tablet, Take 81 mg by mouth daily., Disp: , Rfl:  .  digoxin (LANOXIN) 0.125 MG tablet, Take 0.5 tablets (0.0625 mg total) by mouth daily., Disp: 45 tablet, Rfl: 3 .  furosemide (LASIX) 20 MG tablet, Take 1 tablet (20 mg total) daily by mouth., Disp: 30 tablet, Rfl: 1 .  lisinopril (PRINIVIL,ZESTRIL) 10 MG tablet, Take 1 tablet (10 mg total) by mouth daily., Disp: 30 tablet, Rfl: 6 .  metoprolol succinate (TOPROL XL) 25 MG 24 hr tablet, Take 1 tablet (25 mg total) by mouth daily., Disp: 90 tablet, Rfl: 3 .  rosuvastatin (CRESTOR) 10 MG tablet, Take 1 tablet (10 mg total) by mouth at bedtime., Disp: 90 tablet, Rfl: 3 .  warfarin (COUMADIN) 5 MG tablet, Take 1 tablet (5 mg total) by mouth as directed. Or as directed., Disp: 30 tablet, Rfl: 2  Past Medical History: Past Medical History:  Diagnosis Date  . Atrial fibrillation ([place])   . Chronic combined systolic and diastolic heart failure ([place])   . Chronic sinus complaints    overuses afrin  . Coronary atherosclerosis of native coronary artery   . Hyperlipidemia     Tobacco Use: Social History   Tobacco Use  Smoking Status Former Smoker  Smokeless Tobacco Never Used  Tobacco Comment   Smoked on and off    Labs: Recent Review Flowsheet Data     Labs for ITP Cardiac and Pulmonary Rehab Latest Ref Rng & Units 10/18/2016 10/18/2016 10/19/2016 10/20/2016 01/21/2017   Cholestrol 100 - 199 mg/dL - - - - 132   LDLCALC 0 - 99 mg/dL - - - - 82   HDL >39 mg/dL - - - - 38(L)   Trlycerides 0 - 149 mg/dL - - - - 58   Hemoglobin A1c 4.8 - 5.6 % - - - - -   PHART 7.350 - 7.450 - 7.332(L) - - -   PCO2ART 32.0 - 48.0 mmHg - 48.3(H) - - -   HCO3 20.0 - 28.0 mmol/L - 25.4 - - -   TCO2 22 - 32 mmol/L 24 27 26  - -   ACIDBASEDEF 0.0 - 2.0 mmol/L - 1.0 - - -   O2SAT % - 93.0 73.0 70.2 -      Capillary Blood Glucose: Lab Results  Component Value Date   GLUCAP 176 (H) 10/19/2016   GLUCAP 101 (H) 10/19/2016   GLUCAP 98 10/19/2016   GLUCAP 115 (H) 10/19/2016   GLUCAP 134 (H) 10/18/2016     Exercise Target Goals:    Exercise Program Goal: Individual exercise prescription set with THRR, safety & activity barriers. Participant demonstrates ability to understand and report RPE using BORG scale, to self-measure pulse accurately, and to acknowledge the importance of the exercise  prescription.  Exercise Prescription Goal: Starting with aerobic activity 30 plus minutes a day, 3 days per week for initial exercise prescription. Provide home exercise prescription and guidelines that participant acknowledges understanding prior to discharge.  Activity Barriers & Risk Stratification: Activity Barriers & Cardiac Risk Stratification - 12/11/16 1537      Activity Barriers & Cardiac Risk Stratification   Activity Barriers  Arthritis;Back Problems;Deconditioning;Muscular Weakness;Other (comment)    Comments  B foot discomfort    Cardiac Risk Stratification  High       6 Minute Walk: 6 Minute Walk    Row Name 12/11/16 1535         6 Minute Walk   Phase  Initial     Distance  1400 feet     Walk Time  6 minutes     # of Rest Breaks  0     MPH  2.65     METS  3.14     RPE  12     VO2 Peak  10.99     Symptoms  No     Resting HR  63 bpm      Resting BP  117/64     Resting Oxygen Saturation   99 %     Exercise Oxygen Saturation  during 6 min walk  98 %     Max Ex. HR  103 bpm     Max Ex. BP  160/64        Oxygen Initial Assessment:   Oxygen Re-Evaluation:   Oxygen Discharge (Final Oxygen Re-Evaluation):   Initial Exercise Prescription: Initial Exercise Prescription - 12/11/16 1500      Date of Initial Exercise RX and Referring Provider   Date  12/11/16    Referring Provider  Jenkins Rouge MD      Bike   Level  0.5    Minutes  10      NuStep   Level  3    SPM  70    Minutes  10    METs  2.5      Track   Laps  12    Minutes  10    METs  3.09      Prescription Details   Frequency (times per week)  3    Duration  Progress to 30 minutes of continuous aerobic without signs/symptoms of physical distress      Intensity   THRR 40-80% of Max Heartrate  58-116    Ratings of Perceived Exertion  11-13    Perceived Dyspnea  0-4      Progression   Progression  Continue to progress workloads to maintain intensity without signs/symptoms of physical distress.      Resistance Training   Training Prescription  Yes    Weight  3lbs    Reps  10-15       Perform Capillary Blood Glucose checks as needed.  Exercise Prescription Changes:  Exercise Prescription Changes    Row Name 12/17/16 1620 01/02/17 1510 01/14/17 1525 01/25/17 1434 02/04/17 1437     Response to Exercise   Blood Pressure (Admit)  124/58  130/78  104/60  120/60  128/68   Blood Pressure (Exercise)  160/66  124/70  124/64  122/82  122/76   Blood Pressure (Exit)  124/60  100/62  102/64  122/62  106/62   Heart Rate (Admit)  67 bpm  77 bpm  59 bpm  60 bpm  54 bpm   Heart Rate (Exercise)  102  bpm  95 bpm  97 bpm  88 bpm  90 bpm   Heart Rate (Exit)  64 bpm  59 bpm  59 bpm  57 bpm  59 bpm   Symptoms  none  none  none  none  none   Comments  pt responded well to exercise session.  -  -  -  -   Duration  Continue with 30 min of aerobic exercise  without signs/symptoms of physical distress.  Continue with 30 min of aerobic exercise without signs/symptoms of physical distress.  Continue with 30 min of aerobic exercise without signs/symptoms of physical distress.  Continue with 30 min of aerobic exercise without signs/symptoms of physical distress.  Continue with 30 min of aerobic exercise without signs/symptoms of physical distress.   Intensity  THRR unchanged  THRR unchanged  THRR unchanged  THRR unchanged  THRR unchanged     Progression   Progression  Continue to progress workloads to maintain intensity without signs/symptoms of physical distress.  Continue to progress workloads to maintain intensity without signs/symptoms of physical distress.  Continue to progress workloads to maintain intensity without signs/symptoms of physical distress.  Continue to progress workloads to maintain intensity without signs/symptoms of physical distress.  Continue to progress workloads to maintain intensity without signs/symptoms of physical distress.   Average METs  2.5  3  3.2  3.3  3.3     Resistance Training   Training Prescription  Yes  No relaxation day  Yes relaxation day  Yes  Yes   Weight  3lbs  -  4lbs  4lbs  4lbs   Reps  10-15  -  10-15  10-15  10-15   Time  10 Minutes  -  10 Minutes  10 Minutes  10 Minutes     Bike   Level  0.5  1  1  1  1    Minutes  10  10  10  10  10      NuStep   Level  3  4  4  4  4    SPM  70  70  85  85  85   Minutes  10  10  10  10  10    METs  2  2.2  2.9  3  3      Track   Laps  14  16  15  16  16    Minutes  10  10  10  10  10    METs  3.43  3.79  3.6  3.79  3.79     Home Exercise Plan   Plans to continue exercise at  -  Home (comment) walking  Home (comment) walking  Home (comment) walking  Home (comment) walking   Frequency  -  Add 2 additional days to program exercise sessions.  Add 2 additional days to program exercise sessions.  Add 2 additional days to program exercise sessions.  Add 2 additional days to  program exercise sessions.   Initial Home Exercises Provided  -  12/28/16  12/28/16  12/28/16  12/28/16      Exercise Comments:  Exercise Comments    Row Name 12/18/16 1627 12/28/16 1534 01/15/17 1527 02/05/17 1439     Exercise Comments  Pt was oriented to exercise equipment today. Pt responded well to exercise session; will continue to monitor pt's activity levels.  Home exercise completed  Reviewed METs and goals. Pt is tolerating exercsie very well; will continue to monitor pt's progress and activity  levels.   Reviewed METs and goals. Pt is tolerating exercsie very well; will continue to monitor pt's progress and activity levels.        Exercise Goals and Review:  Exercise Goals    Row Name 12/11/16 1405             Exercise Goals   Increase Physical Activity  Yes       Intervention  Provide advice, education, support and counseling about physical activity/exercise needs.;Develop an individualized exercise prescription for aerobic and resistive training based on initial evaluation findings, risk stratification, comorbidities and participant's personal goals.       Expected Outcomes  Achievement of increased cardiorespiratory fitness and enhanced flexibility, muscular endurance and strength shown through measurements of functional capacity and personal statement of participant.       Increase Strength and Stamina  Yes       Intervention  Provide advice, education, support and counseling about physical activity/exercise needs.;Develop an individualized exercise prescription for aerobic and resistive training based on initial evaluation findings, risk stratification, comorbidities and participant's personal goals.       Expected Outcomes  Achievement of increased cardiorespiratory fitness and enhanced flexibility, muscular endurance and strength shown through measurements of functional capacity and personal statement of participant.       Able to understand and use rate of perceived  exertion (RPE) scale  Yes       Intervention  Provide education and explanation on how to use RPE scale       Expected Outcomes  Short Term: Able to use RPE daily in rehab to express subjective intensity level;Long Term:  Able to use RPE to guide intensity level when exercising independently       Knowledge and understanding of Target Heart Rate Range (THRR)  Yes       Intervention  Provide education and explanation of THRR including how the numbers were predicted and where they are located for reference       Expected Outcomes  Short Term: Able to state/look up THRR;Long Term: Able to use THRR to govern intensity when exercising independently;Short Term: Able to use daily as guideline for intensity in rehab       Able to check pulse independently  Yes       Intervention  Provide education and demonstration on how to check pulse in carotid and radial arteries.;Review the importance of being able to check your own pulse for safety during independent exercise       Expected Outcomes  Short Term: Able to explain why pulse checking is important during independent exercise;Long Term: Able to check pulse independently and accurately       Understanding of Exercise Prescription  Yes       Intervention  Provide education, explanation, and written materials on patient's individual exercise prescription       Expected Outcomes  Short Term: Able to explain program exercise prescription;Long Term: Able to explain home exercise prescription to exercise independently          Exercise Goals Re-Evaluation : Exercise Goals Re-Evaluation    Row Name 12/18/16 1628 01/15/17 1527 02/05/17 1439         Exercise Goal Re-Evaluation   Exercise Goals Review  Able to understand and use rate of perceived exertion (RPE) scale  Understanding of Exercise Prescription;Increase Physical Activity;Knowledge and understanding of Target Heart Rate Range (THRR);Increase Strength and Stamina;Able to understand and use rate of  perceived exertion (RPE) scale;Able to check pulse independently  Understanding of Exercise Prescription;Increase Physical Activity;Knowledge and understanding of Target Heart Rate Range (THRR);Increase Strength and Stamina;Able to understand and use rate of perceived exertion (RPE) scale;Able to check pulse independently     Comments  Pt demonstrated proper use of RPE scale with activity.  Pt is making great progress in cardiac rehab. Pt has increased in walking tolerance and exercise capacity.   Pt is making steady progress in cardiac rehab. Pt continues to exercise safely without signs of physical distress.     Expected Outcomes  Pt will continue to improve in aerobic fitness  Pt will continue to improve in aerobic fitness  Pt will continue to improve in aerobic fitness by coming to cardiac rehab and doing home exercise program of walking          Discharge Exercise Prescription (Final Exercise Prescription Changes): Exercise Prescription Changes - 02/04/17 1437      Response to Exercise   Blood Pressure (Admit)  128/68    Blood Pressure (Exercise)  122/76    Blood Pressure (Exit)  106/62    Heart Rate (Admit)  54 bpm    Heart Rate (Exercise)  90 bpm    Heart Rate (Exit)  59 bpm    Symptoms  none    Duration  Continue with 30 min of aerobic exercise without signs/symptoms of physical distress.    Intensity  THRR unchanged      Progression   Progression  Continue to progress workloads to maintain intensity without signs/symptoms of physical distress.    Average METs  3.3      Resistance Training   Training Prescription  Yes    Weight  4lbs    Reps  10-15    Time  10 Minutes      Bike   Level  1    Minutes  10      NuStep   Level  4    SPM  85    Minutes  10    METs  3      Track   Laps  16    Minutes  10    METs  3.79      Home Exercise Plan   Plans to continue exercise at  Home (comment) walking    Frequency  Add 2 additional days to program exercise sessions.     Initial Home Exercises Provided  12/28/16       Nutrition:  Target Goals: Understanding of nutrition guidelines, daily intake of sodium 1500mg , cholesterol 200mg , calories 30% from fat and 7% or less from saturated fats, daily to have 5 or more servings of fruits and vegetables.  Biometrics: Pre Biometrics - 12/11/16 1537      Pre Biometrics   Waist Circumference  40.5 inches    Hip Circumference  42 inches    Waist to Hip Ratio  0.96 %    Triceps Skinfold  19 mm    % Body Fat  28.5 %    Grip Strength  34 kg    Flexibility  0 in    Single Leg Stand  23 seconds        Nutrition Therapy Plan and Nutrition Goals: Nutrition Therapy & Goals - 12/12/16 0844      Nutrition Therapy   Diet  Therapeutic Lifestyle Change      Personal Nutrition Goals   Nutrition Goal  Pt to identify and limit food sources of saturated fat, trans fat, and sodium  Intervention Plan   Intervention  Prescribe, educate and counsel regarding individualized specific dietary modifications aiming towards targeted core components such as weight, hypertension, lipid management, diabetes, heart failure and other comorbidities.    Expected Outcomes  Short Term Goal: Understand basic principles of dietary content, such as calories, fat, sodium, cholesterol and nutrients.;Long Term Goal: Adherence to prescribed nutrition plan.       Nutrition Discharge: Nutrition Scores: Nutrition Assessments - 12/12/16 0838      MEDFICTS Scores   Pre Score  50       Nutrition Goals Re-Evaluation:   Nutrition Goals Re-Evaluation:   Nutrition Goals Discharge (Final Nutrition Goals Re-Evaluation):   Psychosocial: Target Goals: Acknowledge presence or absence of significant depression and/or stress, maximize coping skills, provide positive support system. Participant is able to verbalize types and ability to use techniques and skills needed for reducing stress and depression.  Initial Review & Psychosocial  Screening: Initial Psych Review & Screening - 12/11/16 1620      Initial Review   Current issues with  None Identified      Family Dynamics   Good Support System?  Yes      Barriers   Psychosocial barriers to participate in program  The patient should benefit from training in stress management and relaxation.      Screening Interventions   Interventions  Encouraged to exercise       Quality of Life Scores: Quality of Life - 12/11/16 1407      Quality of Life Scores   Health/Function Pre  30 %    Socioeconomic Pre  30 %    Psych/Spiritual Pre  30 %    Family Pre  30 %    GLOBAL Pre  30 %       PHQ-9: Recent Review Flowsheet Data    Depression screen [facility] 2/9 12/17/2016 11/02/2016   Decreased Interest 0 0   Down, Depressed, Hopeless 0 0   PHQ - 2 Score 0 0     Interpretation of Total Score  Total Score Depression Severity:  1-4 = Minimal depression, 5-9 = Mild depression, 10-14 = Moderate depression, 15-19 = Moderately severe depression, 20-27 = Severe depression   Psychosocial Evaluation and Intervention: Psychosocial Evaluation - 12/18/16 1413      Psychosocial Evaluation & Interventions   Interventions  Encouraged to exercise with the program and follow exercise prescription;Stress management education;Relaxation education    Comments  Pt has no psychosocial needs at this time, pt feels supported in his rehab participation.  QOL scores show no isssues or areas of concern.    Expected Outcomes  Pt will continue to display positive and healthy coping skills.    Continue Psychosocial Services   No Follow up required       Psychosocial Re-Evaluation: Psychosocial Re-Evaluation    [place] Name 12/18/16 1414 01/18/17 0941 02/14/17 1510         Psychosocial Re-Evaluation   Comments  Pt with QOL scores at 30 for each component.  Pt denies any needs moved here from [place] and is settling into his new home  Pt with QOL scores at 30 for each component.  Pt denies any needs  moved here from [place] and is settling into his new home  Pt with QOL scores at 30 for each component.  Pt denies any needs moved here from [place] and is settling into his new home     Interventions  Relaxation education;Encouraged to attend Cardiac Rehabilitation for the exercise;Stress  management education  Relaxation education;Encouraged to attend Cardiac Rehabilitation for the exercise;Stress management education  Relaxation education;Encouraged to attend Cardiac Rehabilitation for the exercise;Stress management education     Continue Psychosocial Services   No Follow up required  No Follow up required  No Follow up required     Comments  moved to the area and is unpacking and renovating his new home  moved to the area and is unpacking and renovating his new home  -       Initial Review   Source of Stress Concerns  None Identified  None Identified  None Identified        Psychosocial Discharge (Final Psychosocial Re-Evaluation): Psychosocial Re-Evaluation - 02/14/17 1510      Psychosocial Re-Evaluation   Comments  Pt with QOL scores at 30 for each component.  Pt denies any needs moved here from [place] and is settling into his new home    Interventions  Relaxation education;Encouraged to attend Cardiac Rehabilitation for the exercise;Stress management education    Continue Psychosocial Services   No Follow up required      Initial Review   Source of Stress Concerns  None Identified       Vocational Rehabilitation: Provide vocational rehab assistance to qualifying candidates.   Vocational Rehab Evaluation & Intervention: Vocational Rehab - 12/11/16 1620      Initial Vocational Rehab Evaluation & Intervention   Assessment shows need for Vocational Rehabilitation  No       Education: Education Goals: Education classes will be provided on a weekly basis, covering required topics. Participant will state understanding/return demonstration of topics presented.  Learning  Barriers/Preferences: Learning Barriers/Preferences - 12/11/16 1405      Learning Barriers/Preferences   Learning Barriers  Sight    Learning Preferences  Skilled Demonstration       Education Topics: Count Your Pulse:  -Group instruction provided by verbal instruction, demonstration, patient participation and written materials to support subject.  Instructors address importance of being able to find your pulse and how to count your pulse when at home without a heart monitor.  Patients get hands on experience counting their pulse with staff help and individually.   CARDIAC REHAB PHASE II EXERCISE from 02/13/2017 in [place]  Date  12/21/16  Instruction Review Code  2- meets goals/outcomes      Heart Attack, Angina, and Risk Factor Modification:  -Group instruction provided by verbal instruction, video, and written materials to support subject.  Instructors address signs and symptoms of angina and heart attacks.    Also discuss risk factors for heart disease and how to make changes to improve heart health risk factors.   CARDIAC REHAB PHASE II EXERCISE from 02/13/2017 in [place]  Date  12/19/16  Instruction Review Code  2- meets goals/outcomes      Functional Fitness:  -Group instruction provided by verbal instruction, demonstration, patient participation, and written materials to support subject.  Instructors address safety measures for doing things around the house.  Discuss how to get up and down off the floor, how to pick things up properly, how to safely get out of a chair without assistance, and balance training.   Meditation and Mindfulness:  -Group instruction provided by verbal instruction, patient participation, and written materials to support subject.  Instructor addresses importance of mindfulness and meditation practice to help reduce stress and improve awareness.  Instructor also leads participants  through a meditation exercise.  Stretching for Flexibility and Mobility:  -Group instruction provided by verbal instruction, patient participation, and written materials to support subject.  Instructors lead participants through series of stretches that are designed to increase flexibility thus improving mobility.  These stretches are additional exercise for major muscle groups that are typically performed during regular warm up and cool down.   CARDIAC REHAB PHASE II EXERCISE from 02/13/2017 in [place]  Date  01/18/17  Instruction Review Code  2- meets goals/outcomes      Hands Only CPR:  -Group verbal, video, and participation provides a basic overview of AHA guidelines for community CPR. Role-play of emergencies allow participants the opportunity to practice calling for help and chest compression technique with discussion of AED use.   Hypertension: -Group verbal and written instruction that provides a basic overview of hypertension including the most recent diagnostic guidelines, risk factor reduction with self-care instructions and medication management.   CARDIAC REHAB PHASE II EXERCISE from 02/13/2017 in [place]  Date  01/25/17  Instruction Review Code  2- meets goals/outcomes       Nutrition I class: Heart Healthy Eating:  -Group instruction provided by PowerPoint slides, verbal discussion, and written materials to support subject matter. The instructor gives an explanation and review of the Therapeutic Lifestyle Changes diet recommendations, which includes a discussion on lipid goals, dietary fat, sodium, fiber, plant stanol/sterol esters, sugar, and the components of a well-balanced, healthy diet.   Nutrition II class: Lifestyle Skills:  -Group instruction provided by PowerPoint slides, verbal discussion, and written materials to support subject matter. The instructor gives an explanation and review of  label reading, grocery shopping for heart health, heart healthy recipe modifications, and ways to make healthier choices when eating out.   Diabetes Question & Answer:  -Group instruction provided by PowerPoint slides, verbal discussion, and written materials to support subject matter. The instructor gives an explanation and review of diabetes co-morbidities, pre- and post-prandial blood glucose goals, pre-exercise blood glucose goals, signs, symptoms, and treatment of hypoglycemia and hyperglycemia, and foot care basics.   CARDIAC REHAB PHASE II EXERCISE from 02/13/2017 in [place]  Date  02/01/17  Educator  RD  Instruction Review Code  2- meets goals/outcomes      Diabetes Blitz:  -Group instruction provided by PowerPoint slides, verbal discussion, and written materials to support subject matter. The instructor gives an explanation and review of the physiology behind type 1 and type 2 diabetes, diabetes medications and rational behind using different medications, pre- and post-prandial blood glucose recommendations and Hemoglobin A1c goals, diabetes diet, and exercise including blood glucose guidelines for exercising safely.    Portion Distortion:  -Group instruction provided by PowerPoint slides, verbal discussion, written materials, and food models to support subject matter. The instructor gives an explanation of serving size versus portion size, changes in portions sizes over the last 20 years, and what consists of a serving from each food group.   CARDIAC REHAB PHASE II EXERCISE from 02/13/2017 in [place]  Date  02/06/17  Educator  RD  Instruction Review Code  2- meets goals/outcomes      Stress Management:  -Group instruction provided by verbal instruction, video, and written materials to support subject matter.  Instructors review role of stress in heart disease and how to cope with stress positively.     CARDIAC  REHAB PHASE II EXERCISE from 02/13/2017 in [place]  [place]  Date  02/13/17  Instruction Review Code  2- meets goals/outcomes      Exercising on Your Own:  -Group instruction provided by verbal instruction, power point, and written materials to support subject.  Instructors discuss benefits of exercise, components of exercise, frequency and intensity of exercise, and end points for exercise.  Also discuss use of nitroglycerin and activating EMS.  Review options of places to exercise outside of rehab.  Review guidelines for sex with heart disease.   CARDIAC REHAB PHASE II EXERCISE from 02/13/2017 in [place]  Date  12/26/16  Instruction Review Code  2- meets goals/outcomes      Cardiac Drugs I:  -Group instruction provided by verbal instruction and written materials to support subject.  Instructor reviews cardiac drug classes: antiplatelets, anticoagulants, beta blockers, and statins.  Instructor discusses reasons, side effects, and lifestyle considerations for each drug class.   Cardiac Drugs II:  -Group instruction provided by verbal instruction and written materials to support subject.  Instructor reviews cardiac drug classes: angiotensin converting enzyme inhibitors (ACE-I), angiotensin II receptor blockers (ARBs), nitrates, and calcium channel blockers.  Instructor discusses reasons, side effects, and lifestyle considerations for each drug class.   Anatomy and Physiology of the Circulatory System:  Group verbal and written instruction and models provide basic cardiac anatomy and physiology, with the coronary electrical and arterial systems. Review of: AMI, Angina, Valve disease, Heart Failure, Peripheral Artery Disease, Cardiac Arrhythmia, Pacemakers, and the ICD.   Other Education:  -Group or individual verbal, written, or video instructions that support the educational goals of the cardiac rehab program.   CARDIAC REHAB  PHASE II EXERCISE from 02/13/2017 in [place]  Date  12/28/16 [Holiday Eating Survival Tips]  Educator  RD  Instruction Review Code  2- Demonstrated Understanding      Knowledge Questionnaire Score: Knowledge Questionnaire Score - 01/01/17 1655      Knowledge Questionnaire Score   Pre Score  21/24       Core Components/Risk Factors/Patient Goals at Admission: Personal Goals and Risk Factors at Admission - 12/11/16 1538      Core Components/Risk Factors/Patient Goals on Admission   Heart Failure  Yes    Intervention  Provide a combined exercise and nutrition program that is supplemented with education, support and counseling about heart failure. Directed toward relieving symptoms such as shortness of breath, decreased exercise tolerance, and extremity edema.    Expected Outcomes  Improve functional capacity of life;Short term: Attendance in program 2-3 days a week with increased exercise capacity. Reported lower sodium intake. Reported increased fruit and vegetable intake. Reports medication compliance.;Short term: Daily weights obtained and reported for increase. Utilizing diuretic protocols set by physician.;Long term: Adoption of self-care skills and reduction of barriers for early signs and symptoms recognition and intervention leading to self-care maintenance.    Lipids  Yes    Intervention  Provide education and support for participant on nutrition & aerobic/resistive exercise along with prescribed medications to achieve LDL 70mg , HDL >40mg .    Expected Outcomes  Short Term: Participant states understanding of desired cholesterol values and is compliant with medications prescribed. Participant is following exercise prescription and nutrition guidelines.;Long Term: Cholesterol controlled with medications as prescribed, with individualized exercise RX and with personalized nutrition plan. Value goals: LDL < 70mg , HDL > 40 mg.       Core  Components/Risk Factors/Patient Goals Review:  Goals and Risk Factor Review  [place] Name 12/18/16 1410 01/18/17 0941 02/14/17 1509         Core Components/Risk Factors/Patient Goals Review   Personal Goals Review  Heart Failure;Lipids  Heart Failure;Lipids  Heart Failure;Lipids     Review  Pt began participating in cardiac rehab this week. and is off to a great start.  Pt is eager to learn more about risk factor modification for CAD.  Encourage pt to participate in education and exercise classes.   Pt is eager to learn more about risk factor modification for CAD.  Encourage pt to participate in education and exercise classes.   Pt is eager to learn more about risk factor modification for CAD.  Encourage pt to participate in education and exercise classes.     Expected Outcomes  Pt will understand the signs and symptoms of acute heart failure, observe dietary restrictions, weigh daily and when to contact the MD office.  Pt will have lipid panel readings within normal limit and observe dietary intake for heart healthy die.  Pt will understand the signs and symptoms of acute heart failure, observe dietary restrictions, weigh daily and when to contact the MD office.  Pt will have lipid panel readings within normal limit and observe dietary intake for heart healthy die.  Pt will understand the signs and symptoms of acute heart failure, observe dietary restrictions, weigh daily and when to contact the MD office.  Pt will have lipid panel readings within normal limit and observe dietary intake for heart healthy die.        Core Components/Risk Factors/Patient Goals at Discharge (Final Review):  Goals and Risk Factor Review - 02/14/17 1509      Core Components/Risk Factors/Patient Goals Review   Personal Goals Review  Heart Failure;Lipids    Review   Pt is eager to learn more about risk factor modification for CAD.  Encourage pt to participate in education and exercise classes.    Expected Outcomes  Pt  will understand the signs and symptoms of acute heart failure, observe dietary restrictions, weigh daily and when to contact the MD office.  Pt will have lipid panel readings within normal limit and observe dietary intake for heart healthy die.       ITP Comments: ITP Comments    Row Name 12/11/16 1359 12/21/16 0981 01/18/17 0944 02/14/17 1509 02/14/17 1511   ITP Comments  Medical Director, Dr. Fransico Him  Medical Director, Dr. Fransico Him  30 Day ITP Review. Ory has good particpation and attendance at cardiac rehab. Taji's vital signs and weights have been stable.  30 Day ITP Review. Arion has good particpation and attendance at cardiac rehab. Robie's vital signs and weights have been stable.  30 Day ITP Review. Yony has good particpation and attendance at cardiac rehab. Will continue to monitor the patient for signs of weight gain and SOB.      Comments:See ITP comments.Barnet Pall, RN,BSN 02/14/2017 3:18 PM

## 2017-02-15 ENCOUNTER — Encounter (HOSPITAL_COMMUNITY): Payer: Medicare Other

## 2017-02-18 ENCOUNTER — Ambulatory Visit (INDEPENDENT_AMBULATORY_CARE_PROVIDER_SITE_OTHER): Payer: Medicare Other | Admitting: *Deleted

## 2017-02-18 ENCOUNTER — Encounter (HOSPITAL_COMMUNITY)
Admission: RE | Admit: 2017-02-18 | Discharge: 2017-02-18 | Disposition: A | Discharge status: Home / Self Care | Payer: Medicare Other | Source: Ambulatory Visit | Attending: Cardiovascular Disease | Admitting: Cardiovascular Disease

## 2017-02-18 DIAGNOSIS — I5042 Chronic combined systolic (congestive) and diastolic (congestive) heart failure: Secondary | ICD-10-CM | POA: Diagnosis not present

## 2017-02-18 DIAGNOSIS — Z951 Presence of aortocoronary bypass graft: Secondary | ICD-10-CM

## 2017-02-18 DIAGNOSIS — Z5181 Encounter for therapeutic drug level monitoring: Secondary | ICD-10-CM

## 2017-02-18 DIAGNOSIS — I2510 Atherosclerotic heart disease of native coronary artery without angina pectoris: Secondary | ICD-10-CM | POA: Diagnosis not present

## 2017-02-18 DIAGNOSIS — I4891 Unspecified atrial fibrillation: Secondary | ICD-10-CM

## 2017-02-18 DIAGNOSIS — Z79899 Other long term (current) drug therapy: Secondary | ICD-10-CM | POA: Diagnosis not present

## 2017-02-18 DIAGNOSIS — Z7982 Long term (current) use of aspirin: Secondary | ICD-10-CM | POA: Diagnosis not present

## 2017-02-18 LAB — POCT INR: INR: 3.3

## 2017-02-18 NOTE — Patient Instructions (Signed)
Description   Do not take today's dose then continue on same dosage 1/2 tablet daily except 1 tablet on Wednesdays and Saturdays. Recheck in 3 weeks. [facility]#[phone] Main [phone]

## 2017-02-20 ENCOUNTER — Encounter (HOSPITAL_COMMUNITY)
Admission: RE | Admit: 2017-02-20 | Discharge: 2017-02-20 | Disposition: A | Discharge status: Home / Self Care | Payer: Medicare Other | Source: Ambulatory Visit | Attending: Cardiovascular Disease | Admitting: Cardiovascular Disease

## 2017-02-20 ENCOUNTER — Other Ambulatory Visit: Payer: Self-pay | Admitting: Cardiovascular Disease

## 2017-02-20 DIAGNOSIS — Z951 Presence of aortocoronary bypass graft: Secondary | ICD-10-CM

## 2017-02-20 DIAGNOSIS — I2510 Atherosclerotic heart disease of native coronary artery without angina pectoris: Secondary | ICD-10-CM | POA: Insufficient documentation

## 2017-02-20 DIAGNOSIS — Z7982 Long term (current) use of aspirin: Secondary | ICD-10-CM | POA: Insufficient documentation

## 2017-02-20 DIAGNOSIS — I5042 Chronic combined systolic (congestive) and diastolic (congestive) heart failure: Secondary | ICD-10-CM | POA: Insufficient documentation

## 2017-02-20 DIAGNOSIS — E785 Hyperlipidemia, unspecified: Secondary | ICD-10-CM | POA: Insufficient documentation

## 2017-02-20 DIAGNOSIS — R0602 Shortness of breath: Secondary | ICD-10-CM

## 2017-02-20 DIAGNOSIS — I4891 Unspecified atrial fibrillation: Secondary | ICD-10-CM | POA: Diagnosis not present

## 2017-02-20 DIAGNOSIS — Z79899 Other long term (current) drug therapy: Secondary | ICD-10-CM | POA: Insufficient documentation

## 2017-02-20 DIAGNOSIS — Z7901 Long term (current) use of anticoagulants: Secondary | ICD-10-CM | POA: Insufficient documentation

## 2017-02-22 ENCOUNTER — Encounter (HOSPITAL_COMMUNITY)
Admission: RE | Admit: 2017-02-22 | Discharge: 2017-02-22 | Disposition: A | Discharge status: Home / Self Care | Payer: Medicare Other | Source: Ambulatory Visit | Attending: Cardiovascular Disease | Admitting: Cardiovascular Disease

## 2017-02-22 DIAGNOSIS — Z7982 Long term (current) use of aspirin: Secondary | ICD-10-CM | POA: Diagnosis not present

## 2017-02-22 DIAGNOSIS — Z951 Presence of aortocoronary bypass graft: Secondary | ICD-10-CM

## 2017-02-22 DIAGNOSIS — I2510 Atherosclerotic heart disease of native coronary artery without angina pectoris: Secondary | ICD-10-CM | POA: Diagnosis not present

## 2017-02-22 DIAGNOSIS — I5042 Chronic combined systolic (congestive) and diastolic (congestive) heart failure: Secondary | ICD-10-CM | POA: Diagnosis not present

## 2017-02-22 DIAGNOSIS — Z79899 Other long term (current) drug therapy: Secondary | ICD-10-CM | POA: Diagnosis not present

## 2017-02-22 DIAGNOSIS — I4891 Unspecified atrial fibrillation: Secondary | ICD-10-CM | POA: Diagnosis not present

## 2017-02-25 ENCOUNTER — Encounter (HOSPITAL_COMMUNITY): Payer: Medicare Other

## 2017-02-27 ENCOUNTER — Encounter (HOSPITAL_COMMUNITY)
Admission: RE | Admit: 2017-02-27 | Discharge: 2017-02-27 | Disposition: A | Discharge status: Home / Self Care | Payer: Medicare Other | Source: Ambulatory Visit | Attending: Cardiovascular Disease | Admitting: Cardiovascular Disease

## 2017-02-27 DIAGNOSIS — Z951 Presence of aortocoronary bypass graft: Secondary | ICD-10-CM | POA: Diagnosis not present

## 2017-02-27 DIAGNOSIS — Z79899 Other long term (current) drug therapy: Secondary | ICD-10-CM | POA: Diagnosis not present

## 2017-02-27 DIAGNOSIS — I2510 Atherosclerotic heart disease of native coronary artery without angina pectoris: Secondary | ICD-10-CM | POA: Diagnosis not present

## 2017-02-27 DIAGNOSIS — I4891 Unspecified atrial fibrillation: Secondary | ICD-10-CM | POA: Diagnosis not present

## 2017-02-27 DIAGNOSIS — Z7982 Long term (current) use of aspirin: Secondary | ICD-10-CM | POA: Diagnosis not present

## 2017-02-27 DIAGNOSIS — I5042 Chronic combined systolic (congestive) and diastolic (congestive) heart failure: Secondary | ICD-10-CM | POA: Diagnosis not present

## 2017-03-01 ENCOUNTER — Encounter (HOSPITAL_COMMUNITY)
Admission: RE | Admit: 2017-03-01 | Discharge: 2017-03-01 | Disposition: A | Discharge status: Home / Self Care | Payer: Medicare Other | Source: Ambulatory Visit | Attending: Cardiovascular Disease | Admitting: Cardiovascular Disease

## 2017-03-01 DIAGNOSIS — Z951 Presence of aortocoronary bypass graft: Secondary | ICD-10-CM

## 2017-03-01 DIAGNOSIS — I4891 Unspecified atrial fibrillation: Secondary | ICD-10-CM | POA: Diagnosis not present

## 2017-03-01 DIAGNOSIS — Z7982 Long term (current) use of aspirin: Secondary | ICD-10-CM | POA: Diagnosis not present

## 2017-03-01 DIAGNOSIS — Z79899 Other long term (current) drug therapy: Secondary | ICD-10-CM | POA: Diagnosis not present

## 2017-03-01 DIAGNOSIS — I2510 Atherosclerotic heart disease of native coronary artery without angina pectoris: Secondary | ICD-10-CM | POA: Diagnosis not present

## 2017-03-01 DIAGNOSIS — I5042 Chronic combined systolic (congestive) and diastolic (congestive) heart failure: Secondary | ICD-10-CM | POA: Diagnosis not present

## 2017-03-04 ENCOUNTER — Encounter (HOSPITAL_COMMUNITY)
Admission: RE | Admit: 2017-03-04 | Discharge: 2017-03-04 | Disposition: A | Discharge status: Home / Self Care | Payer: Medicare Other | Source: Ambulatory Visit | Attending: Cardiovascular Disease | Admitting: Cardiovascular Disease

## 2017-03-04 DIAGNOSIS — I4891 Unspecified atrial fibrillation: Secondary | ICD-10-CM | POA: Diagnosis not present

## 2017-03-04 DIAGNOSIS — Z951 Presence of aortocoronary bypass graft: Secondary | ICD-10-CM

## 2017-03-04 DIAGNOSIS — I5042 Chronic combined systolic (congestive) and diastolic (congestive) heart failure: Secondary | ICD-10-CM | POA: Diagnosis not present

## 2017-03-04 DIAGNOSIS — Z79899 Other long term (current) drug therapy: Secondary | ICD-10-CM | POA: Diagnosis not present

## 2017-03-04 DIAGNOSIS — I2510 Atherosclerotic heart disease of native coronary artery without angina pectoris: Secondary | ICD-10-CM | POA: Diagnosis not present

## 2017-03-04 DIAGNOSIS — Z7982 Long term (current) use of aspirin: Secondary | ICD-10-CM | POA: Diagnosis not present

## 2017-03-05 ENCOUNTER — Telehealth: Payer: Self-pay | Admitting: Cardiovascular Disease

## 2017-03-05 NOTE — Telephone Encounter (Signed)
New Message     Wife is calling and thinks there is something wrong , he constantly has a runny nose , but he does not have a cold

## 2017-03-05 NOTE — Telephone Encounter (Signed)
Left message for patient to call back  

## 2017-03-05 NOTE — Telephone Encounter (Signed)
An antihistamine like Claritin or Zyrtec would be fine for his rhinitis and do not interact with Coumadin.

## 2017-03-05 NOTE — Telephone Encounter (Signed)
Called patient back about his running nose. Encouraged patient to call his PCP. Patient stated he is not seeing his PCP any more and that he has not seen his new PCP yet. Asked patient if he has used anything over the counter. Patient stated no, he was told not to take anything without asking since he is no coumadin. Will send message to pharmacy to see if they can recommend any OTC medications for a running nose.

## 2017-03-06 ENCOUNTER — Encounter (HOSPITAL_COMMUNITY)
Admission: RE | Admit: 2017-03-06 | Discharge: 2017-03-06 | Disposition: A | Discharge status: Home / Self Care | Payer: Medicare Other | Source: Ambulatory Visit | Attending: Cardiovascular Disease | Admitting: Cardiovascular Disease

## 2017-03-06 DIAGNOSIS — I5042 Chronic combined systolic (congestive) and diastolic (congestive) heart failure: Secondary | ICD-10-CM | POA: Diagnosis not present

## 2017-03-06 DIAGNOSIS — Z951 Presence of aortocoronary bypass graft: Secondary | ICD-10-CM

## 2017-03-06 DIAGNOSIS — Z7982 Long term (current) use of aspirin: Secondary | ICD-10-CM | POA: Diagnosis not present

## 2017-03-06 DIAGNOSIS — I4891 Unspecified atrial fibrillation: Secondary | ICD-10-CM | POA: Diagnosis not present

## 2017-03-06 DIAGNOSIS — Z79899 Other long term (current) drug therapy: Secondary | ICD-10-CM | POA: Diagnosis not present

## 2017-03-06 DIAGNOSIS — I2510 Atherosclerotic heart disease of native coronary artery without angina pectoris: Secondary | ICD-10-CM | POA: Diagnosis not present

## 2017-03-06 NOTE — Telephone Encounter (Signed)
Patient called back. Informed him of Megan Supple's, the pharmacist, recommendations to try Claritin or Zyrtec for his rhinitis. Patient verbalized understanding.

## 2017-03-08 ENCOUNTER — Encounter (HOSPITAL_COMMUNITY)
Admission: RE | Admit: 2017-03-08 | Discharge: 2017-03-08 | Disposition: A | Discharge status: Home / Self Care | Payer: Medicare Other | Source: Ambulatory Visit | Attending: Cardiovascular Disease | Admitting: Cardiovascular Disease

## 2017-03-08 VITALS — Ht 71.5 in | Wt 199.7 lb

## 2017-03-08 DIAGNOSIS — Z7982 Long term (current) use of aspirin: Secondary | ICD-10-CM | POA: Diagnosis not present

## 2017-03-08 DIAGNOSIS — I2510 Atherosclerotic heart disease of native coronary artery without angina pectoris: Secondary | ICD-10-CM | POA: Diagnosis not present

## 2017-03-08 DIAGNOSIS — I5042 Chronic combined systolic (congestive) and diastolic (congestive) heart failure: Secondary | ICD-10-CM | POA: Diagnosis not present

## 2017-03-08 DIAGNOSIS — I4891 Unspecified atrial fibrillation: Secondary | ICD-10-CM | POA: Diagnosis not present

## 2017-03-08 DIAGNOSIS — Z951 Presence of aortocoronary bypass graft: Secondary | ICD-10-CM

## 2017-03-08 DIAGNOSIS — Z79899 Other long term (current) drug therapy: Secondary | ICD-10-CM | POA: Diagnosis not present

## 2017-03-11 ENCOUNTER — Ambulatory Visit (HOSPITAL_COMMUNITY): Payer: Medicare Other | Attending: Internal Medicine

## 2017-03-11 ENCOUNTER — Encounter (INDEPENDENT_AMBULATORY_CARE_PROVIDER_SITE_OTHER): Payer: Self-pay

## 2017-03-11 ENCOUNTER — Encounter (HOSPITAL_COMMUNITY)
Admission: RE | Admit: 2017-03-11 | Discharge: 2017-03-11 | Disposition: A | Discharge status: Home / Self Care | Payer: Medicare Other | Source: Ambulatory Visit | Attending: Cardiovascular Disease | Admitting: Cardiovascular Disease

## 2017-03-11 ENCOUNTER — Ambulatory Visit (INDEPENDENT_AMBULATORY_CARE_PROVIDER_SITE_OTHER): Payer: Medicare Other | Admitting: *Deleted

## 2017-03-11 ENCOUNTER — Telehealth: Payer: Self-pay

## 2017-03-11 DIAGNOSIS — Z7982 Long term (current) use of aspirin: Secondary | ICD-10-CM | POA: Diagnosis not present

## 2017-03-11 DIAGNOSIS — I4891 Unspecified atrial fibrillation: Secondary | ICD-10-CM

## 2017-03-11 DIAGNOSIS — Z5181 Encounter for therapeutic drug level monitoring: Secondary | ICD-10-CM | POA: Diagnosis not present

## 2017-03-11 DIAGNOSIS — I081 Rheumatic disorders of both mitral and tricuspid valves: Secondary | ICD-10-CM | POA: Diagnosis not present

## 2017-03-11 DIAGNOSIS — E785 Hyperlipidemia, unspecified: Secondary | ICD-10-CM | POA: Insufficient documentation

## 2017-03-11 DIAGNOSIS — I5042 Chronic combined systolic (congestive) and diastolic (congestive) heart failure: Secondary | ICD-10-CM | POA: Diagnosis not present

## 2017-03-11 DIAGNOSIS — Z79899 Other long term (current) drug therapy: Secondary | ICD-10-CM | POA: Diagnosis not present

## 2017-03-11 DIAGNOSIS — I25810 Atherosclerosis of coronary artery bypass graft(s) without angina pectoris: Secondary | ICD-10-CM | POA: Insufficient documentation

## 2017-03-11 DIAGNOSIS — R9430 Abnormal result of cardiovascular function study, unspecified: Secondary | ICD-10-CM

## 2017-03-11 DIAGNOSIS — Z951 Presence of aortocoronary bypass graft: Secondary | ICD-10-CM

## 2017-03-11 DIAGNOSIS — I2510 Atherosclerotic heart disease of native coronary artery without angina pectoris: Secondary | ICD-10-CM | POA: Diagnosis not present

## 2017-03-11 DIAGNOSIS — I253 Aneurysm of heart: Secondary | ICD-10-CM | POA: Diagnosis not present

## 2017-03-11 DIAGNOSIS — I5041 Acute combined systolic (congestive) and diastolic (congestive) heart failure: Secondary | ICD-10-CM

## 2017-03-11 LAB — POCT INR: INR: 2

## 2017-03-11 NOTE — Telephone Encounter (Signed)
Called patient back about echo results. Since patient gets claustrophobic, per Dr. Johnsie Cancel,  patient will have a nuclear MUGA scan in 3 months. Patient verbalized understanding.

## 2017-03-11 NOTE — Patient Instructions (Signed)
Description   Continue on same dosage 1/2 tablet daily except 1 tablet on Wednesdays and Saturdays. Recheck in 4 weeks. [facility]#[phone] Main [phone]

## 2017-03-11 NOTE — Telephone Encounter (Signed)
-----   Message from Josue Hector, MD sent at 03/11/2017  2:05 PM EST ----- EF improved but still moderately reduced f/u MRI in 3 months if EF less than 35% will need EP evaluation for AICD

## 2017-03-11 NOTE — Telephone Encounter (Signed)
Left message for patient to call back  

## 2017-03-11 NOTE — Telephone Encounter (Signed)
Follow up ° ° °Patient is returning call. Please call. °

## 2017-03-12 ENCOUNTER — Telehealth: Payer: Self-pay | Admitting: Cardiovascular Disease

## 2017-03-12 NOTE — Telephone Encounter (Signed)
Left message for patient to call back  

## 2017-03-12 NOTE — Telephone Encounter (Signed)
PT HAS QUESTIONS  ONE REGARDING THE MUGA SCAN, AND A FEW OTHER THINGS, PLS CALL [phone]

## 2017-03-12 NOTE — Progress Notes (Signed)
Cardiac Individual Treatment Plan  Patient Details  Name: Jason Day MRN: [id number] Date of Birth: 01-Mar-1940 Referring Provider:     [place] from 12/11/2016 in [place]  Referring Provider  Jenkins Rouge MD      Initial Encounter Date:    CARDIAC REHAB PHASE II ORIENTATION from 12/11/2016 in [place]  Date  12/11/16  Referring Provider  Jenkins Rouge MD      Visit Diagnosis: S/P CABG x 5  Patient's Home Medications on Admission:  Current Outpatient Medications:  .  amiodarone (PACERONE) 200 MG tablet, Take 1 tablet (200 mg total) daily by mouth., Disp: 30 tablet, Rfl: 6 .  aspirin EC 81 MG tablet, Take 81 mg by mouth daily., Disp: , Rfl:  .  digoxin (LANOXIN) 0.125 MG tablet, Take 0.5 tablets (0.0625 mg total) by mouth daily., Disp: 45 tablet, Rfl: 3 .  furosemide (LASIX) 20 MG tablet, TAKE 1 TABLET (20 MG TOTAL) DAILY BY MOUTH., Disp: 30 tablet, Rfl: 11 .  lisinopril (PRINIVIL,ZESTRIL) 10 MG tablet, Take 1 tablet (10 mg total) by mouth daily., Disp: 30 tablet, Rfl: 6 .  metoprolol succinate (TOPROL XL) 25 MG 24 hr tablet, Take 1 tablet (25 mg total) by mouth daily., Disp: 90 tablet, Rfl: 3 .  rosuvastatin (CRESTOR) 10 MG tablet, Take 1 tablet (10 mg total) by mouth at bedtime., Disp: 90 tablet, Rfl: 3 .  warfarin (COUMADIN) 5 MG tablet, Take 1 tablet (5 mg total) by mouth as directed. Or as directed., Disp: 30 tablet, Rfl: 2  Past Medical History: Past Medical History:  Diagnosis Date  . Atrial fibrillation ([place])   . Chronic combined systolic and diastolic heart failure ([place])   . Chronic sinus complaints    overuses afrin  . Coronary atherosclerosis of native coronary artery   . Hyperlipidemia     Tobacco Use: Social History   Tobacco Use  Smoking Status Former Smoker  Smokeless Tobacco Never Used  Tobacco Comment   Smoked on and off    Labs: Recent Review Flowsheet  Data    Labs for ITP Cardiac and Pulmonary Rehab Latest Ref Rng & Units 10/18/2016 10/18/2016 10/19/2016 10/20/2016 01/21/2017   Cholestrol 100 - 199 mg/dL - - - - 132   LDLCALC 0 - 99 mg/dL - - - - 82   HDL >39 mg/dL - - - - 38(L)   Trlycerides 0 - 149 mg/dL - - - - 58   Hemoglobin A1c 4.8 - 5.6 % - - - - -   PHART 7.350 - 7.450 - 7.332(L) - - -   PCO2ART 32.0 - 48.0 mmHg - 48.3(H) - - -   HCO3 20.0 - 28.0 mmol/L - 25.4 - - -   TCO2 22 - 32 mmol/L 24 27 26  - -   ACIDBASEDEF 0.0 - 2.0 mmol/L - 1.0 - - -   O2SAT % - 93.0 73.0 70.2 -      Capillary Blood Glucose: Lab Results  Component Value Date   GLUCAP 176 (H) 10/19/2016   GLUCAP 101 (H) 10/19/2016   GLUCAP 98 10/19/2016   GLUCAP 115 (H) 10/19/2016   GLUCAP 134 (H) 10/18/2016     Exercise Target Goals:    Exercise Program Goal: Individual exercise prescription set using results from initial 6 min walk test and THRR while considering  patient's activity barriers and safety.   Exercise Prescription Goal: Initial exercise prescription builds to 30-45  minutes a day of aerobic activity, 2-3 days per week.  Home exercise guidelines will be given to patient during program as part of exercise prescription that the participant will acknowledge.  Activity Barriers & Risk Stratification: Activity Barriers & Cardiac Risk Stratification - 12/11/16 1537      Activity Barriers & Cardiac Risk Stratification   Activity Barriers  Arthritis;Back Problems;Deconditioning;Muscular Weakness;Other (comment)    Comments  B foot discomfort    Cardiac Risk Stratification  High       6 Minute Walk: 6 Minute Walk    Row Name 12/11/16 1535 03/08/17 0922 03/08/17 0928     6 Minute Walk   Phase  Initial  Discharge  -   Distance  1400 feet  1900 feet  -   Distance % Change  -  35.71 %  -   Distance Feet Change  -  500 ft  -   Walk Time  6 minutes  6 minutes  -   # of Rest Breaks  0  0  -   MPH  2.65  3.59  -   METS  3.14  4.13  -   RPE  12  11   -   Perceived Dyspnea   -  0  -   VO2 Peak  10.99  14.48  -   Symptoms  No  No  -   Resting HR  63 bpm  62 bpm  -   Resting BP  117/64  116/64  -   Resting Oxygen Saturation   99 %  -  -   Exercise Oxygen Saturation  during 6 min walk  98 %  -  -   Max Ex. HR  103 bpm  106 bpm  -   Max Ex. BP  160/64  180/70  -   2 Minute Post BP  -  -  110/60      Oxygen Initial Assessment:   Oxygen Re-Evaluation:   Oxygen Discharge (Final Oxygen Re-Evaluation):   Initial Exercise Prescription: Initial Exercise Prescription - 12/11/16 1500      Date of Initial Exercise RX and Referring Provider   Date  12/11/16    Referring Provider  Jenkins Rouge MD      Bike   Level  0.5    Minutes  10      NuStep   Level  3    SPM  70    Minutes  10    METs  2.5      Track   Laps  12    Minutes  10    METs  3.09      Prescription Details   Frequency (times per week)  3    Duration  Progress to 30 minutes of continuous aerobic without signs/symptoms of physical distress      Intensity   THRR 40-80% of Max Heartrate  58-116    Ratings of Perceived Exertion  11-13    Perceived Dyspnea  0-4      Progression   Progression  Continue to progress workloads to maintain intensity without signs/symptoms of physical distress.      Resistance Training   Training Prescription  Yes    Weight  3lbs    Reps  10-15       Perform Capillary Blood Glucose checks as needed.  Exercise Prescription Changes:  Exercise Prescription Changes    Row Name 12/17/16 1620 01/02/17 1510 01/14/17 1525 01/25/17 1434 02/04/17 1437  Response to Exercise   Blood Pressure (Admit)  124/58  130/78  104/60  120/60  128/68   Blood Pressure (Exercise)  160/66  124/70  124/64  122/82  122/76   Blood Pressure (Exit)  124/60  100/62  102/64  122/62  106/62   Heart Rate (Admit)  67 bpm  77 bpm  59 bpm  60 bpm  54 bpm   Heart Rate (Exercise)  102 bpm  95 bpm  97 bpm  88 bpm  90 bpm   Heart Rate (Exit)  64 bpm  59  bpm  59 bpm  57 bpm  59 bpm   Symptoms  none  none  none  none  none   Comments  pt responded well to exercise session.  -  -  -  -   Duration  Continue with 30 min of aerobic exercise without signs/symptoms of physical distress.  Continue with 30 min of aerobic exercise without signs/symptoms of physical distress.  Continue with 30 min of aerobic exercise without signs/symptoms of physical distress.  Continue with 30 min of aerobic exercise without signs/symptoms of physical distress.  Continue with 30 min of aerobic exercise without signs/symptoms of physical distress.   Intensity  THRR unchanged  THRR unchanged  THRR unchanged  THRR unchanged  THRR unchanged     Progression   Progression  Continue to progress workloads to maintain intensity without signs/symptoms of physical distress.  Continue to progress workloads to maintain intensity without signs/symptoms of physical distress.  Continue to progress workloads to maintain intensity without signs/symptoms of physical distress.  Continue to progress workloads to maintain intensity without signs/symptoms of physical distress.  Continue to progress workloads to maintain intensity without signs/symptoms of physical distress.   Average METs  2.5  3  3.2  3.3  3.3     Resistance Training   Training Prescription  Yes  No relaxation day  Yes relaxation day  Yes  Yes   Weight  3lbs  -  4lbs  4lbs  4lbs   Reps  10-15  -  10-15  10-15  10-15   Time  10 Minutes  -  10 Minutes  10 Minutes  10 Minutes     Bike   Level  0.5  1  1  1  1    Minutes  10  10  10  10  10      NuStep   Level  3  4  4  4  4    SPM  70  70  85  85  85   Minutes  10  10  10  10  10    METs  2  2.2  2.9  3  3      Track   Laps  14  16  15  16  16    Minutes  10  10  10  10  10    METs  3.43  3.79  3.6  3.79  3.79     Home Exercise Plan   Plans to continue exercise at  -  Home (comment) walking  Home (comment) walking  Home (comment) walking  Home (comment) walking   Frequency   -  Add 2 additional days to program exercise sessions.  Add 2 additional days to program exercise sessions.  Add 2 additional days to program exercise sessions.  Add 2 additional days to program exercise sessions.   Initial Home Exercises Provided  -  12/28/16  12/28/16  12/28/16  12/28/16   Row Name 02/27/17 1400 03/01/17 1000           Response to Exercise   Blood Pressure (Admit)  118/64  120/60      Blood Pressure (Exercise)  124/70  136/72      Blood Pressure (Exit)  112/64  130/62      Heart Rate (Admit)  60 bpm  60 bpm      Heart Rate (Exercise)  100 bpm  89 bpm      Heart Rate (Exit)  60 bpm  80 bpm      Rating of Perceived Exertion (Exercise)  12  12      Symptoms  None  None      Duration  Continue with 30 min of aerobic exercise without signs/symptoms of physical distress.  Continue with 30 min of aerobic exercise without signs/symptoms of physical distress.      Intensity  THRR unchanged  THRR unchanged        Progression   Progression  Continue to progress workloads to maintain intensity without signs/symptoms of physical distress.  Continue to progress workloads to maintain intensity without signs/symptoms of physical distress.      Average METs  3.23  3.34        Resistance Training   Training Prescription  No  Yes      Weight  -  5lbs      Reps  -  10-15      Time  -  10 Minutes        Interval Training   Interval Training  No  No        Bike   Level  1  1      Minutes  10  10      METs  -  3.11        NuStep   Level  4  6      SPM  85  85      Minutes  10  10      METs  2.8  3.2        Track   Laps  14  16      Minutes  10  10      METs  3.79  3.79        Home Exercise Plan   Plans to continue exercise at  Home (comment) Walking   Home (comment) Walking      Frequency  Add 2 additional days to program exercise sessions.  Add 2 additional days to program exercise sessions.      Initial Home Exercises Provided  12/28/16  12/28/16         Exercise  Comments:  Exercise Comments    Row Name 12/18/16 1627 12/28/16 1534 01/15/17 1527 02/05/17 1439 03/01/17 1014   Exercise Comments  Pt was oriented to exercise equipment today. Pt responded well to exercise session; will continue to monitor pt's activity levels.  Home exercise completed  Reviewed METs and goals. Pt is tolerating exercsie very well; will continue to monitor pt's progress and activity levels.   Reviewed METs and goals. Pt is tolerating exercsie very well; will continue to monitor pt's progress and activity levels.   Reviewed METs and goals with pt. Pt is responding well to exercise prescription. Will continue to monitor and progress pt.    [place] Name 03/13/17 1027           Exercise Comments  Pt continuing to  respond well to exericse prescription. Pt is able to exercise 30 minutes with no difficulty. Will continue to monitor and progress pt.           Exercise Goals and Review:  Exercise Goals    Row Name 12/11/16 1405             Exercise Goals   Increase Physical Activity  Yes       Intervention  Provide advice, education, support and counseling about physical activity/exercise needs.;Develop an individualized exercise prescription for aerobic and resistive training based on initial evaluation findings, risk stratification, comorbidities and participant's personal goals.       Expected Outcomes  Achievement of increased cardiorespiratory fitness and enhanced flexibility, muscular endurance and strength shown through measurements of functional capacity and personal statement of participant.       Increase Strength and Stamina  Yes       Intervention  Provide advice, education, support and counseling about physical activity/exercise needs.;Develop an individualized exercise prescription for aerobic and resistive training based on initial evaluation findings, risk stratification, comorbidities and participant's personal goals.       Expected Outcomes  Achievement of increased  cardiorespiratory fitness and enhanced flexibility, muscular endurance and strength shown through measurements of functional capacity and personal statement of participant.       Able to understand and use rate of perceived exertion (RPE) scale  Yes       Intervention  Provide education and explanation on how to use RPE scale       Expected Outcomes  Short Term: Able to use RPE daily in rehab to express subjective intensity level;Long Term:  Able to use RPE to guide intensity level when exercising independently       Knowledge and understanding of Target Heart Rate Range (THRR)  Yes       Intervention  Provide education and explanation of THRR including how the numbers were predicted and where they are located for reference       Expected Outcomes  Short Term: Able to state/look up THRR;Long Term: Able to use THRR to govern intensity when exercising independently;Short Term: Able to use daily as guideline for intensity in rehab       Able to check pulse independently  Yes       Intervention  Provide education and demonstration on how to check pulse in carotid and radial arteries.;Review the importance of being able to check your own pulse for safety during independent exercise       Expected Outcomes  Short Term: Able to explain why pulse checking is important during independent exercise;Long Term: Able to check pulse independently and accurately       Understanding of Exercise Prescription  Yes       Intervention  Provide education, explanation, and written materials on patient's individual exercise prescription       Expected Outcomes  Short Term: Able to explain program exercise prescription;Long Term: Able to explain home exercise prescription to exercise independently          Exercise Goals Re-Evaluation : Exercise Goals Re-Evaluation    Row Name 12/18/16 1628 01/15/17 1527 02/05/17 1439 03/13/17 1030 03/13/17 1031     Exercise Goal Re-Evaluation   Exercise Goals Review  Able to understand  and use rate of perceived exertion (RPE) scale  Understanding of Exercise Prescription;Increase Physical Activity;Knowledge and understanding of Target Heart Rate Range (THRR);Increase Strength and Stamina;Able to understand and use rate of perceived exertion (RPE) scale;Able to  check pulse independently  Understanding of Exercise Prescription;Increase Physical Activity;Knowledge and understanding of Target Heart Rate Range (THRR);Increase Strength and Stamina;Able to understand and use rate of perceived exertion (RPE) scale;Able to check pulse independently  Understanding of Exercise Prescription;Increase Physical Activity;Knowledge and understanding of Target Heart Rate Range (THRR);Increase Strength and Stamina;Able to understand and use rate of perceived exertion (RPE) scale;Able to check pulse independently  Understanding of Exercise Prescription;Increase Physical Activity;Knowledge and understanding of Target Heart Rate Range (THRR);Increase Strength and Stamina;Able to understand and use rate of perceived exertion (RPE) scale;Able to check pulse independently   Comments  Pt demonstrated proper use of RPE scale with activity.  Pt is making great progress in cardiac rehab. Pt has increased in walking tolerance and exercise capacity.   Pt is making steady progress in cardiac rehab. Pt continues to exercise safely without signs of physical distress.  -  Pt is doing very well in cardiac rehab. Pt is getting stronger and now uses 5lbs hand weights. Pt is responding well to workload increases.   Expected Outcomes  Pt will continue to improve in aerobic fitness  Pt will continue to improve in aerobic fitness  Pt will continue to improve in aerobic fitness by coming to cardiac rehab and doing home exercise program of walking   -  Pt is still adhering to HEP by walking at home, in addition to exercise in cardiac rehab. Will continue to monitor and progress pt.        Discharge Exercise Prescription (Final  Exercise Prescription Changes): Exercise Prescription Changes - 03/01/17 1000      Response to Exercise   Blood Pressure (Admit)  120/60    Blood Pressure (Exercise)  136/72    Blood Pressure (Exit)  130/62    Heart Rate (Admit)  60 bpm    Heart Rate (Exercise)  89 bpm    Heart Rate (Exit)  80 bpm    Rating of Perceived Exertion (Exercise)  12    Symptoms  None    Duration  Continue with 30 min of aerobic exercise without signs/symptoms of physical distress.    Intensity  THRR unchanged      Progression   Progression  Continue to progress workloads to maintain intensity without signs/symptoms of physical distress.    Average METs  3.34      Resistance Training   Training Prescription  Yes    Weight  5lbs    Reps  10-15    Time  10 Minutes      Interval Training   Interval Training  No      Bike   Level  1    Minutes  10    METs  3.11      NuStep   Level  6    SPM  85    Minutes  10    METs  3.2      Track   Laps  16    Minutes  10    METs  3.79      Home Exercise Plan   Plans to continue exercise at  Home (comment) Walking    Frequency  Add 2 additional days to program exercise sessions.    Initial Home Exercises Provided  12/28/16       Nutrition:  Target Goals: Understanding of nutrition guidelines, daily intake of sodium 1500mg , cholesterol 200mg , calories 30% from fat and 7% or less from saturated fats, daily to have 5 or more servings of fruits and  vegetables.  Biometrics: Pre Biometrics - 12/11/16 1537      Pre Biometrics   Waist Circumference  40.5 inches    Hip Circumference  42 inches    Waist to Hip Ratio  0.96 %    Triceps Skinfold  19 mm    % Body Fat  28.5 %    Grip Strength  34 kg    Flexibility  0 in    Single Leg Stand  23 seconds      Post Biometrics - 03/08/17 0929       Post  Biometrics   Height  5' 11.5" (1.816 m)    Weight  199 lb 11.8 oz (90.6 kg)    Waist Circumference  40 inches    Hip Circumference  41.5 inches     Waist to Hip Ratio  0.96 %    BMI (Calculated)  27.47    Triceps Skinfold  19 mm    % Body Fat  28.4 %    Grip Strength  38 kg    Flexibility  0 in    Single Leg Stand  19 seconds       Nutrition Therapy Plan and Nutrition Goals: Nutrition Therapy & Goals - 12/12/16 0844      Nutrition Therapy   Diet  Therapeutic Lifestyle Change      Personal Nutrition Goals   Nutrition Goal  Pt to identify and limit food sources of saturated fat, trans fat, and sodium      Intervention Plan   Intervention  Prescribe, educate and counsel regarding individualized specific dietary modifications aiming towards targeted core components such as weight, hypertension, lipid management, diabetes, heart failure and other comorbidities.    Expected Outcomes  Short Term Goal: Understand basic principles of dietary content, such as calories, fat, sodium, cholesterol and nutrients.;Long Term Goal: Adherence to prescribed nutrition plan.       Nutrition Assessments: Nutrition Assessments - 12/12/16 0838      MEDFICTS Scores   Pre Score  50       Nutrition Goals Re-Evaluation:   Nutrition Goals Re-Evaluation:   Nutrition Goals Discharge (Final Nutrition Goals Re-Evaluation):   Psychosocial: Target Goals: Acknowledge presence or absence of significant depression and/or stress, maximize coping skills, provide positive support system. Participant is able to verbalize types and ability to use techniques and skills needed for reducing stress and depression.  Initial Review & Psychosocial Screening: Initial Psych Review & Screening - 12/11/16 1620      Initial Review   Current issues with  None Identified      Family Dynamics   Good Support System?  Yes      Barriers   Psychosocial barriers to participate in program  The patient should benefit from training in stress management and relaxation.      Screening Interventions   Interventions  Encouraged to exercise       Quality of Life  Scores: Quality of Life - 12/11/16 1407      Quality of Life Scores   Health/Function Pre  30 %    Socioeconomic Pre  30 %    Psych/Spiritual Pre  30 %    Family Pre  30 %    GLOBAL Pre  30 %      Scores of 19 and below usually indicate a poorer quality of life in these areas.  A difference of  2-3 points is a clinically meaningful difference.  A difference of 2-3 points in the total score  of the Quality of Life Index has been associated with significant improvement in overall quality of life, self-image, physical symptoms, and general health in studies assessing change in quality of life.  PHQ-9: Recent Review Flowsheet Data    Depression screen [facility] 2/9 12/17/2016 11/02/2016   Decreased Interest 0 0   Down, Depressed, Hopeless 0 0   PHQ - 2 Score 0 0     Interpretation of Total Score  Total Score Depression Severity:  1-4 = Minimal depression, 5-9 = Mild depression, 10-14 = Moderate depression, 15-19 = Moderately severe depression, 20-27 = Severe depression   Psychosocial Evaluation and Intervention: Psychosocial Evaluation - 12/18/16 1413      Psychosocial Evaluation & Interventions   Interventions  Encouraged to exercise with the program and follow exercise prescription;Stress management education;Relaxation education    Comments  Pt has no psychosocial needs at this time, pt feels supported in his rehab participation.  QOL scores show no isssues or areas of concern.    Expected Outcomes  Pt will continue to display positive and healthy coping skills.    Continue Psychosocial Services   No Follow up required       Psychosocial Re-Evaluation: Psychosocial Re-Evaluation    [place] Name 12/18/16 1414 01/18/17 0941 02/14/17 1510 03/12/17 1651       Psychosocial Re-Evaluation   Current issues with  -  -  -  None Identified    Comments  Pt with QOL scores at 30 for each component.  Pt denies any needs moved here from [place] and is settling into his new home  Pt with QOL scores at  30 for each component.  Pt denies any needs moved here from [place] and is settling into his new home  Pt with QOL scores at 30 for each component.  Pt denies any needs moved here from [place] and is settling into his new home  -    Interventions  Relaxation education;Encouraged to attend Cardiac Rehabilitation for the exercise;Stress management education  Relaxation education;Encouraged to attend Cardiac Rehabilitation for the exercise;Stress management education  Relaxation education;Encouraged to attend Cardiac Rehabilitation for the exercise;Stress management education  Encouraged to attend Cardiac Rehabilitation for the exercise;Relaxation education    Continue Psychosocial Services   No Follow up required  No Follow up required  No Follow up required  No Follow up required    Comments  moved to the area and is unpacking and renovating his new home  moved to the area and is unpacking and renovating his new home  -  -      Initial Review   Source of Stress Concerns  None Identified  None Identified  None Identified  None Identified       Psychosocial Discharge (Final Psychosocial Re-Evaluation): Psychosocial Re-Evaluation - 03/12/17 1651      Psychosocial Re-Evaluation   Current issues with  None Identified    Interventions  Encouraged to attend Cardiac Rehabilitation for the exercise;Relaxation education    Continue Psychosocial Services   No Follow up required      Initial Review   Source of Stress Concerns  None Identified       Vocational Rehabilitation: Provide vocational rehab assistance to qualifying candidates.   Vocational Rehab Evaluation & Intervention: Vocational Rehab - 12/11/16 1620      Initial Vocational Rehab Evaluation & Intervention   Assessment shows need for Vocational Rehabilitation  No       Education: Education Goals: Education classes will be provided on a  weekly basis, covering required topics. Participant will state understanding/return demonstration  of topics presented.  Learning Barriers/Preferences: Learning Barriers/Preferences - 12/11/16 1405      Learning Barriers/Preferences   Learning Barriers  Sight    Learning Preferences  Skilled Demonstration       Education Topics: Count Your Pulse:  -Group instruction provided by verbal instruction, demonstration, patient participation and written materials to support subject.  Instructors address importance of being able to find your pulse and how to count your pulse when at home without a heart monitor.  Patients get hands on experience counting their pulse with staff help and individually.   CARDIAC REHAB PHASE II EXERCISE from 02/22/2017 in [place]  Date  12/21/16  Instruction Review Code  2- meets goals/outcomes      Heart Attack, Angina, and Risk Factor Modification:  -Group instruction provided by verbal instruction, video, and written materials to support subject.  Instructors address signs and symptoms of angina and heart attacks.    Also discuss risk factors for heart disease and how to make changes to improve heart health risk factors.   CARDIAC REHAB PHASE II EXERCISE from 02/22/2017 in [place]  Date  12/19/16  Instruction Review Code  2- meets goals/outcomes      Functional Fitness:  -Group instruction provided by verbal instruction, demonstration, patient participation, and written materials to support subject.  Instructors address safety measures for doing things around the house.  Discuss how to get up and down off the floor, how to pick things up properly, how to safely get out of a chair without assistance, and balance training.   Meditation and Mindfulness:  -Group instruction provided by verbal instruction, patient participation, and written materials to support subject.  Instructor addresses importance of mindfulness and meditation practice to help reduce stress and improve awareness.  Instructor  also leads participants through a meditation exercise.    Stretching for Flexibility and Mobility:  -Group instruction provided by verbal instruction, patient participation, and written materials to support subject.  Instructors lead participants through series of stretches that are designed to increase flexibility thus improving mobility.  These stretches are additional exercise for major muscle groups that are typically performed during regular warm up and cool down.   CARDIAC REHAB PHASE II EXERCISE from 02/22/2017 in [place]  Date  01/18/17  Instruction Review Code  2- meets goals/outcomes      Hands Only CPR:  -Group verbal, video, and participation provides a basic overview of AHA guidelines for community CPR. Role-play of emergencies allow participants the opportunity to practice calling for help and chest compression technique with discussion of AED use.   Hypertension: -Group verbal and written instruction that provides a basic overview of hypertension including the most recent diagnostic guidelines, risk factor reduction with self-care instructions and medication management.   CARDIAC REHAB PHASE II EXERCISE from 02/22/2017 in [facility]  Date  01/25/17  Instruction Review Code  2- meets goals/outcomes       Nutrition I class: Heart Healthy Eating:  -Group instruction provided by PowerPoint slides, verbal discussion, and written materials to support subject matter. The instructor gives an explanation and review of the Therapeutic Lifestyle Changes diet recommendations, which includes a discussion on lipid goals, dietary fat, sodium, fiber, plant stanol/sterol esters, sugar, and the components of a well-balanced, healthy diet.   Nutrition II class: Lifestyle Skills:  -Group instruction provided  by PowerPoint slides, verbal discussion, and written materials to support subject matter. The instructor gives an  explanation and review of label reading, grocery shopping for heart health, heart healthy recipe modifications, and ways to make healthier choices when eating out.   Diabetes Question & Answer:  -Group instruction provided by PowerPoint slides, verbal discussion, and written materials to support subject matter. The instructor gives an explanation and review of diabetes co-morbidities, pre- and post-prandial blood glucose goals, pre-exercise blood glucose goals, signs, symptoms, and treatment of hypoglycemia and hyperglycemia, and foot care basics.   CARDIAC REHAB PHASE II EXERCISE from 02/22/2017 in [place]  Date  02/01/17  Educator  RD  Instruction Review Code  2- meets goals/outcomes      Diabetes Blitz:  -Group instruction provided by PowerPoint slides, verbal discussion, and written materials to support subject matter. The instructor gives an explanation and review of the physiology behind type 1 and type 2 diabetes, diabetes medications and rational behind using different medications, pre- and post-prandial blood glucose recommendations and Hemoglobin A1c goals, diabetes diet, and exercise including blood glucose guidelines for exercising safely.    Portion Distortion:  -Group instruction provided by PowerPoint slides, verbal discussion, written materials, and food models to support subject matter. The instructor gives an explanation of serving size versus portion size, changes in portions sizes over the last 20 years, and what consists of a serving from each food group.   CARDIAC REHAB PHASE II EXERCISE from 02/22/2017 in [facility]  Date  02/06/17  Educator  RD  Instruction Review Code  2- meets goals/outcomes      Stress Management:  -Group instruction provided by verbal instruction, video, and written materials to support subject matter.  Instructors review role of stress in heart disease and how to cope with stress  positively.     CARDIAC REHAB PHASE II EXERCISE from 02/22/2017 in [place]  Date  02/13/17  Instruction Review Code  2- meets goals/outcomes      Exercising on Your Own:  -Group instruction provided by verbal instruction, power point, and written materials to support subject.  Instructors discuss benefits of exercise, components of exercise, frequency and intensity of exercise, and end points for exercise.  Also discuss use of nitroglycerin and activating EMS.  Review options of places to exercise outside of rehab.  Review guidelines for sex with heart disease.   CARDIAC REHAB PHASE II EXERCISE from 02/22/2017 in [facility]  Date  12/26/16  Instruction Review Code  2- meets goals/outcomes      Cardiac Drugs I:  -Group instruction provided by verbal instruction and written materials to support subject.  Instructor reviews cardiac drug classes: antiplatelets, anticoagulants, beta blockers, and statins.  Instructor discusses reasons, side effects, and lifestyle considerations for each drug class.   Cardiac Drugs II:  -Group instruction provided by verbal instruction and written materials to support subject.  Instructor reviews cardiac drug classes: angiotensin converting enzyme inhibitors (ACE-I), angiotensin II receptor blockers (ARBs), nitrates, and calcium channel blockers.  Instructor discusses reasons, side effects, and lifestyle considerations for each drug class.   Anatomy and Physiology of the Circulatory System:  Group verbal and written instruction and models provide basic cardiac anatomy and physiology, with the coronary electrical and arterial systems. Review of: AMI, Angina, Valve disease, Heart Failure, Peripheral Artery Disease, Cardiac Arrhythmia, Pacemakers, and the ICD.   Other Education:  -Group or  individual verbal, written, or video instructions that support the educational goals of the cardiac rehab  program.   [facility] from 02/22/2017 in [place]  Date  12/28/16 [Holiday Eating Survival Tips]  Educator  RD  Instruction Review Code  2- Demonstrated Understanding      Knowledge Questionnaire Score: Knowledge Questionnaire Score - 01/01/17 1655      Knowledge Questionnaire Score   Pre Score  21/24       Core Components/Risk Factors/Patient Goals at Admission: Personal Goals and Risk Factors at Admission - 12/11/16 1538      Core Components/Risk Factors/Patient Goals on Admission   Heart Failure  Yes    Intervention  Provide a combined exercise and nutrition program that is supplemented with education, support and counseling about heart failure. Directed toward relieving symptoms such as shortness of breath, decreased exercise tolerance, and extremity edema.    Expected Outcomes  Improve functional capacity of life;Short term: Attendance in program 2-3 days a week with increased exercise capacity. Reported lower sodium intake. Reported increased fruit and vegetable intake. Reports medication compliance.;Short term: Daily weights obtained and reported for increase. Utilizing diuretic protocols set by physician.;Long term: Adoption of self-care skills and reduction of barriers for early signs and symptoms recognition and intervention leading to self-care maintenance.    Lipids  Yes    Intervention  Provide education and support for participant on nutrition & aerobic/resistive exercise along with prescribed medications to achieve LDL 70mg , HDL >40mg .    Expected Outcomes  Short Term: Participant states understanding of desired cholesterol values and is compliant with medications prescribed. Participant is following exercise prescription and nutrition guidelines.;Long Term: Cholesterol controlled with medications as prescribed, with individualized exercise RX and with personalized nutrition plan. Value goals: LDL < 70mg , HDL > 40 mg.        Core Components/Risk Factors/Patient Goals Review:  Goals and Risk Factor Review    Row Name 12/18/16 1410 01/18/17 0941 02/14/17 1509 03/12/17 1650       Core Components/Risk Factors/Patient Goals Review   Personal Goals Review  Heart Failure;Lipids  Heart Failure;Lipids  Heart Failure;Lipids  Heart Failure;Lipids    Review  Pt began participating in cardiac rehab this week. and is off to a great start.  Pt is eager to learn more about risk factor modification for CAD.  Encourage pt to participate in education and exercise classes.   Pt is eager to learn more about risk factor modification for CAD.  Encourage pt to participate in education and exercise classes.   Pt is eager to learn more about risk factor modification for CAD.  Encourage pt to participate in education and exercise classes.   Pt is eager to learn more about risk factor modification for CAD.  Encourage pt to participate in education and exercise classes.    Expected Outcomes  Pt will understand the signs and symptoms of acute heart failure, observe dietary restrictions, weigh daily and when to contact the MD office.  Pt will have lipid panel readings within normal limit and observe dietary intake for heart healthy die.  Pt will understand the signs and symptoms of acute heart failure, observe dietary restrictions, weigh daily and when to contact the MD office.  Pt will have lipid panel readings within normal limit and observe dietary intake for heart healthy die.  Pt will understand the signs and symptoms of acute heart failure, observe dietary restrictions, weigh daily and when to contact the  MD office.  Pt will have lipid panel readings within normal limit and observe dietary intake for heart healthy die.  Pt will understand the signs and symptoms of acute heart failure, observe dietary restrictions, weigh daily and when to contact the MD office.  Pt will have lipid panel readings within normal limit and observe dietary intake for  heart healthy die.       Core Components/Risk Factors/Patient Goals at Discharge (Final Review):  Goals and Risk Factor Review - 03/12/17 1650      Core Components/Risk Factors/Patient Goals Review   Personal Goals Review  Heart Failure;Lipids    Review   Pt is eager to learn more about risk factor modification for CAD.  Encourage pt to participate in education and exercise classes.    Expected Outcomes  Pt will understand the signs and symptoms of acute heart failure, observe dietary restrictions, weigh daily and when to contact the MD office.  Pt will have lipid panel readings within normal limit and observe dietary intake for heart healthy die.       ITP Comments: ITP Comments    Row Name 12/11/16 1359 12/21/16 0175 01/18/17 0944 02/14/17 1509 02/14/17 1511   ITP Comments  Medical Director, Dr. Fransico Him  Medical Director, Dr. Fransico Him  30 Day ITP Review. Aubery has good particpation and attendance at cardiac rehab. Kalden's vital signs and weights have been stable.  30 Day ITP Review. Edoardo has good particpation and attendance at cardiac rehab. Manny's vital signs and weights have been stable.  30 Day ITP Review. Joyce has good particpation and attendance at cardiac rehab. Will continue to monitor the patient for signs of weight gain and SOB.   Rodney Name 03/12/17 1650           ITP Comments  30 Day ITP Review. Winner has good particpation and attendance at cardiac rehab. Will continue to monitor the patient for signs of weight gain and SOB.          Comments: See ITP comments.Barnet Pall, RN,BSN 03/13/2017 4:21 PM

## 2017-03-12 NOTE — Telephone Encounter (Signed)
Patient wants to schedule appt when he comes in for his office visit.

## 2017-03-13 ENCOUNTER — Encounter (HOSPITAL_COMMUNITY)
Admission: RE | Admit: 2017-03-13 | Discharge: 2017-03-13 | Disposition: A | Discharge status: Home / Self Care | Payer: Medicare Other | Source: Ambulatory Visit | Attending: Cardiovascular Disease | Admitting: Cardiovascular Disease

## 2017-03-13 DIAGNOSIS — Z7982 Long term (current) use of aspirin: Secondary | ICD-10-CM | POA: Diagnosis not present

## 2017-03-13 DIAGNOSIS — Z951 Presence of aortocoronary bypass graft: Secondary | ICD-10-CM

## 2017-03-13 DIAGNOSIS — I4891 Unspecified atrial fibrillation: Secondary | ICD-10-CM | POA: Diagnosis not present

## 2017-03-13 DIAGNOSIS — Z79899 Other long term (current) drug therapy: Secondary | ICD-10-CM | POA: Diagnosis not present

## 2017-03-13 DIAGNOSIS — I2510 Atherosclerotic heart disease of native coronary artery without angina pectoris: Secondary | ICD-10-CM | POA: Diagnosis not present

## 2017-03-13 DIAGNOSIS — I5042 Chronic combined systolic (congestive) and diastolic (congestive) heart failure: Secondary | ICD-10-CM | POA: Diagnosis not present

## 2017-03-15 ENCOUNTER — Encounter (HOSPITAL_COMMUNITY)
Admission: RE | Admit: 2017-03-15 | Discharge: 2017-03-15 | Disposition: A | Discharge status: Home / Self Care | Payer: Medicare Other | Source: Ambulatory Visit | Attending: Cardiovascular Disease | Admitting: Cardiovascular Disease

## 2017-03-15 DIAGNOSIS — Z7982 Long term (current) use of aspirin: Secondary | ICD-10-CM | POA: Diagnosis not present

## 2017-03-15 DIAGNOSIS — I5042 Chronic combined systolic (congestive) and diastolic (congestive) heart failure: Secondary | ICD-10-CM | POA: Diagnosis not present

## 2017-03-15 DIAGNOSIS — Z79899 Other long term (current) drug therapy: Secondary | ICD-10-CM | POA: Diagnosis not present

## 2017-03-15 DIAGNOSIS — Z951 Presence of aortocoronary bypass graft: Secondary | ICD-10-CM | POA: Diagnosis not present

## 2017-03-15 DIAGNOSIS — I2510 Atherosclerotic heart disease of native coronary artery without angina pectoris: Secondary | ICD-10-CM | POA: Diagnosis not present

## 2017-03-15 DIAGNOSIS — I4891 Unspecified atrial fibrillation: Secondary | ICD-10-CM | POA: Diagnosis not present

## 2017-03-18 ENCOUNTER — Encounter (HOSPITAL_COMMUNITY)
Admission: RE | Admit: 2017-03-18 | Discharge: 2017-03-18 | Disposition: A | Discharge status: Home / Self Care | Payer: Medicare Other | Source: Ambulatory Visit | Attending: Cardiovascular Disease | Admitting: Cardiovascular Disease

## 2017-03-18 ENCOUNTER — Other Ambulatory Visit (HOSPITAL_COMMUNITY): Payer: Medicare Other

## 2017-03-18 ENCOUNTER — Encounter (HOSPITAL_COMMUNITY): Payer: Medicare Other

## 2017-03-18 ENCOUNTER — Ambulatory Visit: Payer: Medicare Other | Admitting: Gastroenterology

## 2017-03-18 DIAGNOSIS — Z79899 Other long term (current) drug therapy: Secondary | ICD-10-CM | POA: Diagnosis not present

## 2017-03-18 DIAGNOSIS — I5042 Chronic combined systolic (congestive) and diastolic (congestive) heart failure: Secondary | ICD-10-CM | POA: Diagnosis not present

## 2017-03-18 DIAGNOSIS — I2510 Atherosclerotic heart disease of native coronary artery without angina pectoris: Secondary | ICD-10-CM | POA: Diagnosis not present

## 2017-03-18 DIAGNOSIS — Z7982 Long term (current) use of aspirin: Secondary | ICD-10-CM | POA: Diagnosis not present

## 2017-03-18 DIAGNOSIS — I4891 Unspecified atrial fibrillation: Secondary | ICD-10-CM | POA: Diagnosis not present

## 2017-03-18 DIAGNOSIS — Z951 Presence of aortocoronary bypass graft: Secondary | ICD-10-CM | POA: Diagnosis not present

## 2017-03-18 NOTE — Progress Notes (Signed)
03/19/2017 [name]   22-Aug-1940  [id number]  Primary Physician Jason Carbon, MD Primary Cardiologist: Dr. Johnsie Day   Reason for Visit/CC: f/u for acute on chronic systolic HF  HPI:  Jason Day is a 77 y.o. 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 He initially presented w/ acute pulmonary edema and diuresed. Echo showed reduced LVEF, down to 30%, leading to LHC, which showed severe multivessel CAD. 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 He had post op afib and started on amiodarne and coumadin. He is now in NSR. He has been on guidelines medical therapy for systolic HF    He does not have a primary and tends to call our office for everything including a runny nose He is claustrophobic and cannot have MRI.   Last echo on medical Rx 03/11/17 showed EF 35% severe LVH mild to moderate MR   Discussed having f/u MUGA scan and if EF 35% or less refer for AICD   Current Meds  Medication Sig  . aspirin EC 81 MG tablet Take 81 mg by mouth daily.  . digoxin (LANOXIN) 0.125 MG tablet Take 0.5 tablets (0.0625 mg total) by mouth daily.  . furosemide (LASIX) 20 MG tablet TAKE 1 TABLET (20 MG TOTAL) DAILY BY MOUTH.  . metoprolol succinate (TOPROL XL) 25 MG 24 hr tablet Take 1 tablet (25 mg total) by mouth daily.  . rosuvastatin (CRESTOR) 10 MG tablet Take 1 tablet (10 mg total) by mouth at bedtime.  Marland Kitchen warfarin (COUMADIN) 5 MG tablet Take 1 tablet (5 mg total) by mouth as directed. Or as directed.  . [DISCONTINUED] amiodarone (PACERONE) 200 MG tablet Take 1 tablet (200 mg total) daily by mouth.  . [DISCONTINUED] lisinopril (PRINIVIL,ZESTRIL) 10 MG tablet Take 10 mg by mouth daily.   No Known Allergies Past Medical History:  Diagnosis Date  . Atrial fibrillation ([place])   . Chronic combined systolic and diastolic heart failure ([place])   . Chronic sinus complaints    overuses afrin  . Coronary atherosclerosis of native coronary artery   . Hyperlipidemia    Family History  Problem Relation Age of Onset    . COPD Father   . Emphysema Father   . Healthy Brother   . Diabetes Neg Hx   . Cancer Neg Hx    Past Surgical History:  Procedure Laterality Date  . BACK SURGERY  ~2014   disc repair  . CHOLECYSTECTOMY    . CORONARY ARTERY BYPASS GRAFT N/A 10/18/2016   Procedure: CORONARY ARTERY BYPASS GRAFTING (CABG) x five , using left internal mammary artery and right leg greater saphenous vein harvested endoscopically;  Surgeon: Gaye Pollack, MD;  Location: [facility];  Service: Open Heart Surgery;  Laterality: N/A;  . FOOT SURGERY    . RIGHT/LEFT HEART CATH AND CORONARY ANGIOGRAPHY N/A 10/16/2016   Procedure: RIGHT/LEFT HEART CATH AND CORONARY ANGIOGRAPHY;  Surgeon: Belva Crome, MD;  Location: [facility];  Service: Cardiovascular;  Laterality: N/A;  . stents in liver    . TEE WITHOUT CARDIOVERSION N/A 10/18/2016   Procedure: TRANSESOPHAGEAL ECHOCARDIOGRAM (TEE);  Surgeon: Gaye Pollack, MD;  Location: [facility];  Service: Open Heart Surgery;  Laterality: N/A;   Social History   Socioeconomic History  . Marital status: Married    Spouse name: Not on file  . Number of children: 2  . Years of education: Not on file  . Highest education level: Not on file  Social Needs  . Financial resource strain: Not  on file  . Food insecurity - worry: Not on file  . Food insecurity - inability: Not on file  . Transportation needs - medical: Not on file  . Transportation needs - non-medical: Not on file  Occupational History  . Occupation: Web designer    Comment: Retired  Tobacco Use  . Smoking status: Former Research scientist (life sciences)  . Smokeless tobacco: Never Used  . Tobacco comment: Smoked on and off  Substance and Sexual Activity  . Alcohol use: No  . Drug use: No  . Sexual activity: Yes  Other Topics Concern  . Not on file  Social History Narrative   1 son   1 estranged daughter      Has living will   Wife is health care POA--then son   Would accept resuscitation   No tube feeds if  cognitively unaware     Review of Systems: General: negative for chills, fever, night sweats or weight changes.  Cardiovascular: negative for chest pain, dyspnea on exertion, edema, orthopnea, palpitations, paroxysmal nocturnal dyspnea or shortness of breath Dermatological: negative for rash Respiratory: negative for cough or wheezing Urologic: negative for hematuria Abdominal: negative for nausea, vomiting, diarrhea, bright red blood per rectum, melena, or hematemesis Neurologic: negative for visual changes, syncope, or dizziness All other systems reviewed and are otherwise negative except as noted above.   Physical Exam:  Affect appropriate Chronically ill white male  HEENT: normal Neck supple with no adenopathy JVP normal no bruits no thyromegaly Lungs clear with no wheezing and good diaphragmatic motion Heart:  S1/S2 MR  murmur, no rub, gallop or click PMI increased  Abdomen: benighn, BS positve, no tenderness, no AAA no bruit.  No HSM or HJR Distal pulses intact with no bruits Trace LE edema Neuro non-focal Skin warm and dry No muscular weakness   EKG NSR rate 36 old IMI 01/07/17   ASSESSMENT AND PLAN:   1. Acute on Chronic Systolic HF: improved with current dosing lasix discussed changing To Jason Day will stop zestril and decrease lasix to 10 mg f/u pharmacist to titrate BMET today   2. 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 And 35% echo 03/11/17  MUGA scan If low will refer for AICD   3. CAD: s/p CABG 09/2016. Stable w/o anginal symptoms. Continue ASA, BB and statin.   4. HTN: stable.   5. PAF: maintaining NSR w/ amiodarone. He is on coumadin for a/c. INRs are followed in our office.   6. HLD: LDL in August was 112 mg/dL. He is now on statin therapy with Crestor. Goal LDL is <70 mg/dL. Plan for f/u FLP and HFTs, scheduled 12/2. If not at goal, he will need further increase in Crestor dose. Currently at 10 mg.     Jason Day

## 2017-03-19 ENCOUNTER — Ambulatory Visit (INDEPENDENT_AMBULATORY_CARE_PROVIDER_SITE_OTHER): Payer: Medicare Other | Admitting: Cardiovascular Disease

## 2017-03-19 ENCOUNTER — Telehealth: Payer: Self-pay | Admitting: Cardiovascular Disease

## 2017-03-19 ENCOUNTER — Other Ambulatory Visit: Payer: Self-pay

## 2017-03-19 ENCOUNTER — Encounter: Payer: Self-pay | Admitting: Cardiovascular Disease

## 2017-03-19 ENCOUNTER — Telehealth: Payer: Self-pay | Admitting: Internal Medicine

## 2017-03-19 VITALS — BP 112/44 | HR 54 | Ht 73.0 in | Wt 197.0 lb

## 2017-03-19 DIAGNOSIS — R0602 Shortness of breath: Secondary | ICD-10-CM

## 2017-03-19 DIAGNOSIS — I255 Ischemic cardiomyopathy: Secondary | ICD-10-CM

## 2017-03-19 DIAGNOSIS — I5041 Acute combined systolic (congestive) and diastolic (congestive) heart failure: Secondary | ICD-10-CM

## 2017-03-19 DIAGNOSIS — I25810 Atherosclerosis of coronary artery bypass graft(s) without angina pectoris: Secondary | ICD-10-CM | POA: Diagnosis not present

## 2017-03-19 DIAGNOSIS — I10 Essential (primary) hypertension: Secondary | ICD-10-CM

## 2017-03-19 DIAGNOSIS — I480 Paroxysmal atrial fibrillation: Secondary | ICD-10-CM | POA: Diagnosis not present

## 2017-03-19 DIAGNOSIS — Z7689 Persons encountering health services in other specified circumstances: Secondary | ICD-10-CM

## 2017-03-19 DIAGNOSIS — E785 Hyperlipidemia, unspecified: Secondary | ICD-10-CM

## 2017-03-19 LAB — BASIC METABOLIC PANEL
BUN / CREAT RATIO: 16 (ref 10–24)
BUN: 27 mg/dL (ref 8–27)
CO2: 24 mmol/L (ref 20–29)
CREATININE: 1.65 mg/dL — AB (ref 0.76–1.27)
Calcium: 9.1 mg/dL (ref 8.6–10.2)
Chloride: 98 mmol/L (ref 96–106)
GFR calc Af Amer: 46 mL/min/{1.73_m2} — ABNORMAL LOW (ref >59)
GFR calc non Af Amer: 40 mL/min/{1.73_m2} — ABNORMAL LOW (ref >59)
GLUCOSE: 106 mg/dL — AB (ref 65–99)
Potassium: 4.7 mmol/L (ref 3.5–5.2)
SODIUM: 142 mmol/L (ref 134–144)

## 2017-03-19 MED ORDER — FUROSEMIDE 20 MG PO TABS: 10.0000 mg | ORAL_TABLET | Freq: Every day | ORAL | 3 refills | Status: DC

## 2017-03-19 MED ORDER — SACUBITRIL-VALSARTAN 24-26 MG PO TABS: 1.0000 | ORAL_TABLET | Freq: Two times a day (BID) | ORAL | 11 refills | Status: DC

## 2017-03-19 NOTE — Telephone Encounter (Signed)
New message  Pt verbalized that he is calling for the RN  He has some questions about a medication that Dr.Nishan prescribed  Pt verbalized that he do not know the name of the medication ( it was not beside him at the moment it was downstairs)    He said that there are a lot of side affects and he is not sure Dr.Nishan is aware of and he needs to know his options

## 2017-03-19 NOTE — Telephone Encounter (Signed)
Spoke with Pt he stated he wanted to see Dr Cathlean Cower.  Pt is aware I will have to check with dr Jenny Reichmann and dr Silvio Pate to switch providers

## 2017-03-19 NOTE — Telephone Encounter (Signed)
Ok to schedule transfer of care  Copied from [place] [phone]. Topic: Appointment Scheduling - Scheduling Inquiry for Clinic >> Mar 19, 2017 12:29 PM Valla Leaver wrote: Reason for CRM: Vaughan Basta from [facility] calling to notify that patient would like to switch his PCP from Dr. Silvio Pate of [facility] to Dr. Scarlette Calico [facility] is accepting w/ his approval. Please notify [facility] staff to confirm whether or not this patient is transferable.

## 2017-03-19 NOTE — Telephone Encounter (Signed)
Patient called about taking Entresto. Patient is refusing to take Entresto. Patient stated he has read on the Internet and that this is a bad drug and he will not be taking this medication.Patient stated he wants to go back on what he was taking. Will send message to Dr. Johnsie Cancel.

## 2017-03-19 NOTE — Progress Notes (Signed)
Patient requested referral to Dr. Biagio Borg.

## 2017-03-19 NOTE — Telephone Encounter (Signed)
Disregard message about transfer of care will put another phone note into dr Silvio Pate and dr Ronnald Ramp

## 2017-03-19 NOTE — Telephone Encounter (Signed)
No, I am not available to see him

## 2017-03-19 NOTE — Telephone Encounter (Signed)
Left message for patient to call back  

## 2017-03-19 NOTE — Telephone Encounter (Signed)
Spoke with pam she is aware dr Ronnald Ramp cannot see pt. She ask me to call pt to let him know

## 2017-03-19 NOTE — Telephone Encounter (Signed)
Ok to schedule transfer of care  - Scheduling Inquiry for Clinic   Reason for CRM: Vaughan Basta from [facility] calling to notify that patient would like to switch his PCP from Dr. Silvio Pate of [facility] to Dr. Scarlette Calico of [facility] is accepting w/ his approval. Please notify [facility] staff to confirm whether or not this patient is transferable.

## 2017-03-19 NOTE — Telephone Encounter (Signed)
Ok to schedule transfer of care  Copied from [place] [phone]. Topic: Appointment Scheduling - Scheduling Inquiry for Clinic >> Mar 19, 2017 12:29 PM Valla Leaver wrote: Reason for CRM: Vaughan Basta from [facility] calling to notify that patient would like to switch his PCP from Dr. Silvio Pate of [facility] to Dr. Scarlette Calico of [facility] is accepting w/ his approval. Please notify [facility] staff to confirm whether or not this patient is transferable.

## 2017-03-19 NOTE — Telephone Encounter (Signed)
Ok with me, but must be ok with Dr Silvio Pate

## 2017-03-19 NOTE — Telephone Encounter (Signed)
Follow up ° ° ° °Patient returning call.  Please call °

## 2017-03-19 NOTE — Patient Instructions (Addendum)
Medication Instructions:  Your physician has recommended you make the following change in your medication:  1-STOP lisinopril 2-STOP amiodarone 3-START Entresto 24/26 mg by mouth twice daily, START on Thursday 4-DECREASE Lasix 10 mg by mouth daily   Labwork: Your physician recommends that you have lab work today- BMET  Testing/Procedures: Your physician has requested that you have a MUGA in 3 months: A multigated acquisition (MUGA) scan is a test that looks at the chambers and blood vessels of the heart. Please see the CareNotes handout/brochure given to you today for further information.  Follow-Up: Your physician recommends that you schedule a follow-up appointment in:  3 weeks with Pharmacy to titrate [facility].   Your physician wants you to follow-up in: 3 months with Dr. Johnsie Cancel.    If you need a refill on your cardiac medications before your next appointment, please call your pharmacy.

## 2017-03-20 ENCOUNTER — Telehealth: Payer: Self-pay | Admitting: Cardiovascular Disease

## 2017-03-20 ENCOUNTER — Encounter (HOSPITAL_COMMUNITY)
Admission: RE | Admit: 2017-03-20 | Discharge: 2017-03-20 | Disposition: A | Discharge status: Home / Self Care | Payer: Medicare Other | Source: Ambulatory Visit | Attending: Cardiovascular Disease | Admitting: Cardiovascular Disease

## 2017-03-20 DIAGNOSIS — I5042 Chronic combined systolic (congestive) and diastolic (congestive) heart failure: Secondary | ICD-10-CM | POA: Diagnosis not present

## 2017-03-20 DIAGNOSIS — Z951 Presence of aortocoronary bypass graft: Secondary | ICD-10-CM | POA: Diagnosis not present

## 2017-03-20 DIAGNOSIS — Z79899 Other long term (current) drug therapy: Secondary | ICD-10-CM | POA: Diagnosis not present

## 2017-03-20 DIAGNOSIS — Z7982 Long term (current) use of aspirin: Secondary | ICD-10-CM | POA: Diagnosis not present

## 2017-03-20 DIAGNOSIS — I4891 Unspecified atrial fibrillation: Secondary | ICD-10-CM | POA: Diagnosis not present

## 2017-03-20 DIAGNOSIS — I2510 Atherosclerotic heart disease of native coronary artery without angina pectoris: Secondary | ICD-10-CM | POA: Diagnosis not present

## 2017-03-20 NOTE — Progress Notes (Signed)
Discharge Progress Report  Patient Details  Name: Jason Day MRN: [id number] Date of Birth: April 16, 1940 Referring Provider:     [place] from 12/11/2016 in [place]  Referring Provider  Jenkins Rouge MD       Number of Visits: 35  Reason for Discharge:  Patient independent in their exercise.  Smoking History:  Social History   Tobacco Use  Smoking Status Former Smoker  Smokeless Tobacco Never Used  Tobacco Comment   Smoked on and off    Diagnosis:  S/P CABG x 5  ADL UCSD:   Initial Exercise Prescription: Initial Exercise Prescription - 12/11/16 1500      Date of Initial Exercise RX and Referring Provider   Date  12/11/16    Referring Provider  Jenkins Rouge MD      Bike   Level  0.5    Minutes  10      NuStep   Level  3    SPM  70    Minutes  10    METs  2.5      Track   Laps  12    Minutes  10    METs  3.09      Prescription Details   Frequency (times per week)  3    Duration  Progress to 30 minutes of continuous aerobic without signs/symptoms of physical distress      Intensity   THRR 40-80% of Max Heartrate  58-116    Ratings of Perceived Exertion  11-13    Perceived Dyspnea  0-4      Progression   Progression  Continue to progress workloads to maintain intensity without signs/symptoms of physical distress.      Resistance Training   Training Prescription  Yes    Weight  3lbs    Reps  10-15       Discharge Exercise Prescription (Final Exercise Prescription Changes): Exercise Prescription Changes - 03/20/17 1621      Response to Exercise   Blood Pressure (Admit)  132/78    Blood Pressure (Exercise)  132/62    Blood Pressure (Exit)  108/70    Heart Rate (Admit)  58 bpm    Heart Rate (Exercise)  113 bpm    Heart Rate (Exit)  66 bpm    Rating of Perceived Exertion (Exercise)  12    Symptoms  None    Duration  Continue with 30 min of aerobic exercise without signs/symptoms of  physical distress.    Intensity  THRR unchanged      Progression   Progression  Continue to progress workloads to maintain intensity without signs/symptoms of physical distress.    Average METs  3.3      Resistance Training   Training Prescription  No      Interval Training   Interval Training  No      Bike   Level  1    Minutes  10    METs  3.11      NuStep   Level  6    SPM  85    Minutes  10    METs  2.9      Track   Laps  16    Minutes  10    METs  2.9      Home Exercise Plan   Plans to continue exercise at  Home (comment) Walking    Frequency  Add 2 additional days to program exercise  sessions.    Initial Home Exercises Provided  12/28/16       Functional Capacity: 6 Minute Walk    Row Name 12/11/16 1535 03/08/17 0922 03/08/17 0928     6 Minute Walk   Phase  Initial  Discharge  -   Distance  1400 feet  1900 feet  -   Distance % Change  -  35.71 %  -   Distance Feet Change  -  500 ft  -   Walk Time  6 minutes  6 minutes  -   # of Rest Breaks  0  0  -   MPH  2.65  3.59  -   METS  3.14  4.13  -   RPE  12  11  -   Perceived Dyspnea   -  0  -   VO2 Peak  10.99  14.48  -   Symptoms  No  No  -   Resting HR  63 bpm  62 bpm  -   Resting BP  117/64  116/64  -   Resting Oxygen Saturation   99 %  -  -   Exercise Oxygen Saturation  during 6 min walk  98 %  -  -   Max Ex. HR  103 bpm  106 bpm  -   Max Ex. BP  160/64  180/70  -   2 Minute Post BP  -  -  110/60      Psychological, QOL, Others - Outcomes: PHQ 2/9: Depression screen [facility] 2/9 03/20/2017 12/17/2016 11/02/2016  Decreased Interest 0 0 0  Down, Depressed, Hopeless 0 0 0  PHQ - 2 Score 0 0 0    Quality of Life: Quality of Life - 03/25/17 1636      Quality of Life Scores   Health/Function Pre  30 %    Health/Function Post  23.33 %    Health/Function % Change  -22.23 %    Socioeconomic Pre  30 %    Socioeconomic Post  23.44 %    Socioeconomic % Change   -21.87 %    Psych/Spiritual Pre  30 %     Psych/Spiritual Post  23.21 %    Psych/Spiritual % Change  -22.63 %    Family Pre  30 %    Family Post  26 %    Family % Change  -13.33 %    GLOBAL Pre  30 %    GLOBAL Post  23.71 %    GLOBAL % Change  -20.97 %       Personal Goals: Goals established at orientation with interventions provided to work toward goal. Personal Goals and Risk Factors at Admission - 12/11/16 1538      Core Components/Risk Factors/Patient Goals on Admission   Heart Failure  Yes    Intervention  Provide a combined exercise and nutrition program that is supplemented with education, support and counseling about heart failure. Directed toward relieving symptoms such as shortness of breath, decreased exercise tolerance, and extremity edema.    Expected Outcomes  Improve functional capacity of life;Short term: Attendance in program 2-3 days a week with increased exercise capacity. Reported lower sodium intake. Reported increased fruit and vegetable intake. Reports medication compliance.;Short term: Daily weights obtained and reported for increase. Utilizing diuretic protocols set by physician.;Long term: Adoption of self-care skills and reduction of barriers for early signs and symptoms recognition and intervention leading to self-care maintenance.    Lipids  Yes  Intervention  Provide education and support for participant on nutrition & aerobic/resistive exercise along with prescribed medications to achieve LDL <44m, HDL >413m    Expected Outcomes  Short Term: Participant states understanding of desired cholesterol values and is compliant with medications prescribed. Participant is following exercise prescription and nutrition guidelines.;Long Term: Cholesterol controlled with medications as prescribed, with individualized exercise RX and with personalized nutrition plan. Value goals: LDL < 7031mHDL > 40 mg.        Personal Goals Discharge: Goals and Risk Factor Review    Row Name 12/18/16 1410 01/18/17 0941  02/14/17 1509 03/12/17 1650       Core Components/Risk Factors/Patient Goals Review   Personal Goals Review  Heart Failure;Lipids  Heart Failure;Lipids  Heart Failure;Lipids  Heart Failure;Lipids    Review  Pt began participating in cardiac rehab this week. and is off to a great start.  Pt is eager to learn more about risk factor modification for CAD.  Encourage pt to participate in education and exercise classes.   Pt is eager to learn more about risk factor modification for CAD.  Encourage pt to participate in education and exercise classes.   Pt is eager to learn more about risk factor modification for CAD.  Encourage pt to participate in education and exercise classes.   Pt is eager to learn more about risk factor modification for CAD.  Encourage pt to participate in education and exercise classes.    Expected Outcomes  Pt will understand the signs and symptoms of acute heart failure, observe dietary restrictions, weigh daily and when to contact the MD office.  Pt will have lipid panel readings within normal limit and observe dietary intake for heart healthy die.  Pt will understand the signs and symptoms of acute heart failure, observe dietary restrictions, weigh daily and when to contact the MD office.  Pt will have lipid panel readings within normal limit and observe dietary intake for heart healthy die.  Pt will understand the signs and symptoms of acute heart failure, observe dietary restrictions, weigh daily and when to contact the MD office.  Pt will have lipid panel readings within normal limit and observe dietary intake for heart healthy die.  Pt will understand the signs and symptoms of acute heart failure, observe dietary restrictions, weigh daily and when to contact the MD office.  Pt will have lipid panel readings within normal limit and observe dietary intake for heart healthy die.       Exercise Goals and Review: Exercise Goals    Row Name 12/11/16 1405             Exercise Goals    Increase Physical Activity  Yes       Intervention  Provide advice, education, support and counseling about physical activity/exercise needs.;Develop an individualized exercise prescription for aerobic and resistive training based on initial evaluation findings, risk stratification, comorbidities and participant's personal goals.       Expected Outcomes  Achievement of increased cardiorespiratory fitness and enhanced flexibility, muscular endurance and strength shown through measurements of functional capacity and personal statement of participant.       Increase Strength and Stamina  Yes       Intervention  Provide advice, education, support and counseling about physical activity/exercise needs.;Develop an individualized exercise prescription for aerobic and resistive training based on initial evaluation findings, risk stratification, comorbidities and participant's personal goals.       Expected Outcomes  Achievement of increased cardiorespiratory fitness and  enhanced flexibility, muscular endurance and strength shown through measurements of functional capacity and personal statement of participant.       Able to understand and use rate of perceived exertion (RPE) scale  Yes       Intervention  Provide education and explanation on how to use RPE scale       Expected Outcomes  Short Term: Able to use RPE daily in rehab to express subjective intensity level;Long Term:  Able to use RPE to guide intensity level when exercising independently       Knowledge and understanding of Target Heart Rate Range (THRR)  Yes       Intervention  Provide education and explanation of THRR including how the numbers were predicted and where they are located for reference       Expected Outcomes  Short Term: Able to state/look up THRR;Long Term: Able to use THRR to govern intensity when exercising independently;Short Term: Able to use daily as guideline for intensity in rehab       Able to check pulse independently  Yes        Intervention  Provide education and demonstration on how to check pulse in carotid and radial arteries.;Review the importance of being able to check your own pulse for safety during independent exercise       Expected Outcomes  Short Term: Able to explain why pulse checking is important during independent exercise;Long Term: Able to check pulse independently and accurately       Understanding of Exercise Prescription  Yes       Intervention  Provide education, explanation, and written materials on patient's individual exercise prescription       Expected Outcomes  Short Term: Able to explain program exercise prescription;Long Term: Able to explain home exercise prescription to exercise independently          Nutrition & Weight - Outcomes: Pre Biometrics - 12/11/16 1537      Pre Biometrics   Waist Circumference  40.5 inches    Hip Circumference  42 inches    Waist to Hip Ratio  0.96 %    Triceps Skinfold  19 mm    % Body Fat  28.5 %    Grip Strength  34 kg    Flexibility  0 in    Single Leg Stand  23 seconds      Post Biometrics - 03/08/17 0929       Post  Biometrics   Height  5' 11.5" (1.816 m)    Weight  199 lb 11.8 oz (90.6 kg)    Waist Circumference  40 inches    Hip Circumference  41.5 inches    Waist to Hip Ratio  0.96 %    BMI (Calculated)  27.47    Triceps Skinfold  19 mm    % Body Fat  28.4 %    Grip Strength  38 kg    Flexibility  0 in    Single Leg Stand  19 seconds       Nutrition: Nutrition Therapy & Goals - 12/12/16 0844      Nutrition Therapy   Diet  Therapeutic Lifestyle Change      Personal Nutrition Goals   Nutrition Goal  Pt to identify and limit food sources of saturated fat, trans fat, and sodium      Intervention Plan   Intervention  Prescribe, educate and counsel regarding individualized specific dietary modifications aiming towards targeted core components such as weight, hypertension, lipid  management, diabetes, heart failure and other  comorbidities.    Expected Outcomes  Short Term Goal: Understand basic principles of dietary content, such as calories, fat, sodium, cholesterol and nutrients.;Long Term Goal: Adherence to prescribed nutrition plan.       Nutrition Discharge: Nutrition Assessments - 03/27/17 1018      MEDFICTS Scores   Pre Score  50    Post Score  28    Score Difference  -22       Education Questionnaire Score: Knowledge Questionnaire Score - 03/25/17 1636      Knowledge Questionnaire Score   Post Score  19/24       Goals reviewed with patient; copy given to patient. Jason Day graduated from cardiac rehab program today with completion of 35 exercise sessions in Phase II. Pt maintained good attendance and progressed nicely during his participation in rehab as evidenced by increased MET level.   Medication list reconciled. Repeat  PHQ score- 0 .  Pt has made significant lifestyle changes and should be commended for his success. Pt feels he has achieved his goals during cardiac rehab.   Pt plans to continue exercise by walking and going to the gym. Jason Day increased his distance on his post exercise walk test and maintained his weight while in the program. Barnet Pall, RN,BSN 03/28/2017 2:27 PM

## 2017-03-20 NOTE — Telephone Encounter (Signed)
Patient aware of lab result. Will send copy to patient's PCP.

## 2017-03-20 NOTE — Telephone Encounter (Signed)
Patient scheduled.

## 2017-03-20 NOTE — Telephone Encounter (Signed)
Okay with me I only saw him once

## 2017-03-20 NOTE — Telephone Encounter (Signed)
New message    Patient returning call  for lab results. Patient states to call him or his wife.

## 2017-03-20 NOTE — Telephone Encounter (Signed)
Tammy Can you help get this pt scheduled Thanks

## 2017-03-21 ENCOUNTER — Other Ambulatory Visit: Payer: Self-pay | Admitting: Cardiovascular Disease

## 2017-04-09 ENCOUNTER — Ambulatory Visit (INDEPENDENT_AMBULATORY_CARE_PROVIDER_SITE_OTHER): Payer: Medicare Other | Admitting: Pharmacist

## 2017-04-09 VITALS — BP 134/68 | HR 56

## 2017-04-09 DIAGNOSIS — I4891 Unspecified atrial fibrillation: Secondary | ICD-10-CM

## 2017-04-09 DIAGNOSIS — Z951 Presence of aortocoronary bypass graft: Secondary | ICD-10-CM

## 2017-04-09 DIAGNOSIS — R0602 Shortness of breath: Secondary | ICD-10-CM | POA: Diagnosis not present

## 2017-04-09 DIAGNOSIS — Z5181 Encounter for therapeutic drug level monitoring: Secondary | ICD-10-CM | POA: Diagnosis not present

## 2017-04-09 DIAGNOSIS — I255 Ischemic cardiomyopathy: Secondary | ICD-10-CM

## 2017-04-09 DIAGNOSIS — I5041 Acute combined systolic (congestive) and diastolic (congestive) heart failure: Secondary | ICD-10-CM | POA: Diagnosis not present

## 2017-04-09 LAB — POCT INR: INR: 2.3

## 2017-04-09 MED ORDER — SPIRONOLACTONE 25 MG PO TABS: 12.5000 mg | ORAL_TABLET | Freq: Every day | ORAL | 11 refills | Status: DC

## 2017-04-09 NOTE — Progress Notes (Signed)
Patient ID: Jason Day                 DOB: 1940/11/18                      MRN: [id number]     HPI: Jason Day is a 77 y.o. male referred by Jason Day to pharmacy clinic for HF optimization. 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 He was seen in clinic 3 weeks ago and changed from lisinopril to [place]. Lasix dose was also decreased to 10mg  daily.  Pt never started Entresto or decreased his Lasix dose. He spoke for 20+ minutes about all of the side effects of medication and that his wife researched [name] and he asked his former doctors in [place] who apparently told him it was a horrible drug. He does not want to take medication in general but states that he "always listens to Dr Kyla Balzarine advice" although he did not make either of the medication changes recommended by Dr Johnsie Day at hist last visit. When attempting to explain the benefits of Entresto and his other medications and that Dr Johnsie Day wanted him to start this medication, he went off on a tangent about his mother who stopped taking all of her medications. He does mention that [name] was going to cost him $365 per month.  He has continued his lisinopril 10mg  daily and furosemide 20mg  daily. States he will take an extra furosemide tablet if he gains 3-4 lbs. Unable to discuss diet and exercise as it was difficult to interrupt patient at all throughout the visit.  Current meds: lisinopril 10mg  daily, Toprol 25mg  daily, Lasix 20mg  daily BP goal: <130/36mmHg  Family History: Father with COPD and emphysema.  Social History: Former smoker, denies alcohol and illicit drug use.  Wt Readings from Last 3 Encounters:  03/19/17 197 lb (89.4 kg)  03/08/17 199 lb 11.8 oz (90.6 kg)  01/07/17 200 lb (90.7 kg)   BP Readings from Last 3 Encounters:  03/19/17 (!) 112/44  01/07/17 (!) 100/52  12/12/16 110/70   Pulse Readings from Last 3 Encounters:  03/19/17 (!) 54    01/07/17 64  12/12/16 66    Renal function: CrCl cannot be calculated (Unknown ideal weight.).  Past Medical History:  Diagnosis Date  . Atrial fibrillation ([place])   . Chronic combined systolic and diastolic heart failure ([place])   . Chronic sinus complaints    overuses afrin  . Coronary atherosclerosis of native coronary artery   . Hyperlipidemia     Current Outpatient Medications on File Prior to Visit  Medication Sig Dispense Refill  . aspirin EC 81 MG tablet Take 81 mg by mouth daily.    . digoxin (LANOXIN) 0.125 MG tablet Take 0.5 tablets (0.0625 mg total) by mouth daily. 45 tablet 3  . furosemide (LASIX) 20 MG tablet Take 0.5 tablets (10 mg total) by mouth daily. 45 tablet 3  . metoprolol succinate (TOPROL XL) 25 MG 24 hr tablet Take 1 tablet (25 mg total) by mouth daily. 90 tablet 3  . rosuvastatin (CRESTOR) 10 MG tablet Take 1 tablet (10 mg total) by mouth at bedtime. 90 tablet 3  . sacubitril-valsartan (ENTRESTO) 24-26 MG Take 1 tablet by mouth 2 (two) times daily. 60 tablet 11  . warfarin (COUMADIN) 5 MG tablet TAKE 1 TABLET (5 MG TOTAL) BY MOUTH AS DIRECTED. OR AS DIRECTED. 30 tablet 2  No current facility-administered medications on file prior to visit.     No Known Allergies   Assessment/Plan:  1. HF medication optimization - Difficult patient to relay factual medication knowledge to as pt spent most of the visit talking about why he refused to take [facility] with many medical inaccuracies. Attempted to clarify patient's misconceptions about risks and benefits of Entresto but was unable to do so. He did seem more agreeable to trying low dose spironolactone. I printed out the RALES study for his wife to read so that she is reading appropriate medical literature rather than online articles. Unsure if pt will actually start spironolactone; he deferred making follow up BMET in 1 week and stated he would call when he started spironolactone to schedule lab and HTN f/u visit.  Will continue his Toprol 25mg  daily, lisinopril 10mg  daily, and furosemide 20mg  daily.   [name], PharmD, CPP, [place] [address], [place], [place] [address] Phone: [phone]; Fax: [phone] 04/09/2017 9:44 AM

## 2017-04-09 NOTE — Patient Instructions (Signed)
Description   Continue on same dosage 1/2 tablet daily except 1 tablet on Wednesdays and Saturdays. Recheck in 4 weeks. [facility]#[phone] Main [phone]

## 2017-04-09 NOTE — Patient Instructions (Addendum)
Start taking spironolactone 1/2 tablet daily. This medicine will help improve the pumping function of your heart (ejection fraction) and has been shown to keep patients alive longer  Continue to take your other medications  Call Jinny Blossom in the pharmacy clinic so that we can schedule labwork 7-10 days after you start spironolactone [phone]

## 2017-04-18 ENCOUNTER — Telehealth: Payer: Self-pay

## 2017-04-18 NOTE — Telephone Encounter (Signed)
Pr GI referral note, "02/13/17  Patient cancelled appointment on 03/18/17 states he will be out of town and will call back and reschedule. "  The patient has yet to be seen by GI.

## 2017-04-22 ENCOUNTER — Encounter: Payer: Self-pay | Admitting: Internal Medicine

## 2017-04-22 ENCOUNTER — Ambulatory Visit (INDEPENDENT_AMBULATORY_CARE_PROVIDER_SITE_OTHER): Payer: Medicare Other | Admitting: Internal Medicine

## 2017-04-22 ENCOUNTER — Other Ambulatory Visit (INDEPENDENT_AMBULATORY_CARE_PROVIDER_SITE_OTHER): Payer: Medicare Other

## 2017-04-22 VITALS — BP 106/64 | HR 69 | Temp 98.1°F | Ht 73.0 in | Wt 195.0 lb

## 2017-04-22 DIAGNOSIS — R972 Elevated prostate specific antigen [PSA]: Secondary | ICD-10-CM

## 2017-04-22 DIAGNOSIS — R5383 Other fatigue: Secondary | ICD-10-CM

## 2017-04-22 DIAGNOSIS — K648 Other hemorrhoids: Secondary | ICD-10-CM

## 2017-04-22 DIAGNOSIS — R198 Other specified symptoms and signs involving the digestive system and abdomen: Secondary | ICD-10-CM | POA: Diagnosis not present

## 2017-04-22 DIAGNOSIS — I429 Cardiomyopathy, unspecified: Secondary | ICD-10-CM

## 2017-04-22 DIAGNOSIS — R17 Unspecified jaundice: Secondary | ICD-10-CM | POA: Insufficient documentation

## 2017-04-22 DIAGNOSIS — I5042 Chronic combined systolic (congestive) and diastolic (congestive) heart failure: Secondary | ICD-10-CM | POA: Diagnosis not present

## 2017-04-22 DIAGNOSIS — K921 Melena: Secondary | ICD-10-CM | POA: Diagnosis not present

## 2017-04-22 DIAGNOSIS — R197 Diarrhea, unspecified: Secondary | ICD-10-CM | POA: Insufficient documentation

## 2017-04-22 DIAGNOSIS — E785 Hyperlipidemia, unspecified: Secondary | ICD-10-CM

## 2017-04-22 DIAGNOSIS — K644 Residual hemorrhoidal skin tags: Secondary | ICD-10-CM | POA: Insufficient documentation

## 2017-04-22 DIAGNOSIS — Z0000 Encounter for general adult medical examination without abnormal findings: Secondary | ICD-10-CM

## 2017-04-22 DIAGNOSIS — I255 Ischemic cardiomyopathy: Secondary | ICD-10-CM | POA: Diagnosis not present

## 2017-04-22 DIAGNOSIS — K649 Unspecified hemorrhoids: Secondary | ICD-10-CM | POA: Insufficient documentation

## 2017-04-22 HISTORY — DX: Encounter for general adult medical examination without abnormal findings: Z00.00

## 2017-04-22 LAB — URINALYSIS, ROUTINE W REFLEX MICROSCOPIC
Bilirubin Urine: NEGATIVE
Leukocytes, UA: NEGATIVE
Nitrite: NEGATIVE
Specific Gravity, Urine: 1.02 (ref 1.000–1.030)
Total Protein, Urine: NEGATIVE
Urine Glucose: NEGATIVE
Urobilinogen, UA: 1 (ref 0.0–1.0)
pH: 6 (ref 5.0–8.0)

## 2017-04-22 LAB — HEPATIC FUNCTION PANEL
ALT: 20 U/L (ref 0–53)
AST: 19 U/L (ref 0–37)
Albumin: 4 g/dL (ref 3.5–5.2)
Alkaline Phosphatase: 81 U/L (ref 39–117)
Bilirubin, Direct: 0.3 mg/dL (ref 0.0–0.3)
Total Bilirubin: 1.8 mg/dL — ABNORMAL HIGH (ref 0.2–1.2)
Total Protein: 6.6 g/dL (ref 6.0–8.3)

## 2017-04-22 LAB — CBC WITH DIFFERENTIAL/PLATELET
Basophils Absolute: 0 K/uL (ref 0.0–0.1)
Basophils Relative: 0.3 % (ref 0.0–3.0)
Eosinophils Absolute: 0.1 K/uL (ref 0.0–0.7)
Eosinophils Relative: 1.8 % (ref 0.0–5.0)
HCT: 42.2 % (ref 39.0–52.0)
Hemoglobin: 14.7 g/dL (ref 13.0–17.0)
Lymphocytes Relative: 24 % (ref 12.0–46.0)
Lymphs Abs: 1.5 K/uL (ref 0.7–4.0)
MCHC: 34.9 g/dL (ref 30.0–36.0)
MCV: 88.9 fl (ref 78.0–100.0)
Monocytes Absolute: 0.6 K/uL (ref 0.1–1.0)
Monocytes Relative: 9.1 % (ref 3.0–12.0)
Neutro Abs: 4 K/uL (ref 1.4–7.7)
Neutrophils Relative %: 64.8 % (ref 43.0–77.0)
Platelets: 162 K/uL (ref 150.0–400.0)
RBC: 4.75 Mil/uL (ref 4.22–5.81)
RDW: 15.7 % — ABNORMAL HIGH (ref 11.5–15.5)
WBC: 6.1 K/uL (ref 4.0–10.5)

## 2017-04-22 LAB — LIPID PANEL
Cholesterol: 187 mg/dL (ref 0–200)
HDL: 29.8 mg/dL — ABNORMAL LOW (ref >39.00)
Total CHOL/HDL Ratio: 6
Triglycerides: 407 mg/dL — ABNORMAL HIGH (ref 0.0–149.0)

## 2017-04-22 LAB — BASIC METABOLIC PANEL
BUN: 25 mg/dL — AB (ref 6–23)
CALCIUM: 9.4 mg/dL (ref 8.4–10.5)
CO2: 34 meq/L — AB (ref 19–32)
CREATININE: 1.56 mg/dL — AB (ref 0.40–1.50)
Chloride: 102 mEq/L (ref 96–112)
GFR: 46.18 mL/min — ABNORMAL LOW (ref >60.00)
GLUCOSE: 89 mg/dL (ref 70–99)
Potassium: 4.4 mEq/L (ref 3.5–5.1)
SODIUM: 142 meq/L (ref 135–145)

## 2017-04-22 LAB — TSH: TSH: 1.26 u[IU]/mL (ref 0.35–4.50)

## 2017-04-22 LAB — PSA: PSA: 3.78 ng/mL (ref 0.10–4.00)

## 2017-04-22 LAB — LDL CHOLESTEROL, DIRECT: Direct LDL: 119 mg/dL

## 2017-04-22 NOTE — Progress Notes (Signed)
Subjective:    Patient ID: Jason Day, male    DOB: [birth year], 77 y.o.   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  Wife and pt state they were unaware of this and have received no communication despite documentation - (? Some communication vs pt acceptance and avoidance issue?).  There was also noted 12/2016 elevated Bilirubin with hx of stent related to remote CCX and elevated bilirubin, though details unclear.  GI referral recommended per Dr Laureen Abrahams, but pt has adamantly balked at this, appears overwhelmed by recent events, and makes statements indicating a consideration to stopping all of his meds as he has great difficulty accepting he now will be tx wth chronic medication for his life remainder.  Wife is very supportive and persists in directing him to take all meds as prescribed.  He seems likely to have good compliance to date despite his attitude today  She also believes they both had prevnar per [facility] PCP early 2018. Also, pt has ongoing intermittent BRBPR, ? Hemorrhoid related (no prior surgury hx), also with alternating constipation and diarrhea, also had recent mild elevated isolated bilirubin, has been referred to GI as above  Pt state he is wary of further eval and tx until his cardiology issues are improved,  Last colonoscopy Feb 2018 - neg for polyps per wife. Also, Denies urinary symptoms such as dysuria, frequency, urgency, flank pain, hematuria or n/v, fever, chills, but wife recalls a hx of elevated PSA but no prior bx, asks for f/u lab Past Medical History:  Diagnosis Date  . Atrial fibrillation ([place])   . Chronic combined systolic and diastolic heart  failure ([place])   . Chronic sinus complaints    overuses afrin  . Coronary atherosclerosis of native coronary artery   . Hyperlipidemia    Past Surgical History:  Procedure Laterality Date  . BACK SURGERY  ~2014   disc repair  . CHOLECYSTECTOMY    . CORONARY ARTERY BYPASS GRAFT N/A 10/18/2016   Procedure: CORONARY ARTERY BYPASS GRAFTING (CABG) x five , using left internal mammary artery and right leg greater saphenous vein harvested endoscopically;  Surgeon: Gaye Pollack, MD;  Location: [place] OR;  Service: Open Heart Surgery;  Laterality: N/A;  . FOOT SURGERY    . RIGHT/LEFT HEART CATH AND CORONARY ANGIOGRAPHY N/A 10/16/2016   Procedure: RIGHT/LEFT HEART CATH AND CORONARY ANGIOGRAPHY;  Surgeon: Belva Crome, MD;  Location: [place] [facility];  Service: Cardiovascular;  Laterality: N/A;  . stents in liver    . TEE WITHOUT CARDIOVERSION N/A 10/18/2016   Procedure: TRANSESOPHAGEAL ECHOCARDIOGRAM (TEE);  Surgeon: Gaye Pollack, MD;  Location: [place];  Service: Open Heart Surgery;  Laterality: N/A;    reports that he has quit smoking. he has never used smokeless tobacco. He reports that he does not drink alcohol or use drugs. family history includes COPD in his father; Emphysema in his father; Healthy in his brother. No Known Allergies Current Outpatient Medications on File Prior to Visit  Medication Sig Dispense Refill  . aspirin EC 81 MG tablet Take 81 mg by mouth daily.    Marland Kitchen  digoxin (LANOXIN) 0.125 MG tablet Take 0.5 tablets (0.0625 mg total) by mouth daily. 45 tablet 3  . furosemide (LASIX) 20 MG tablet Take 1 tablet (20 mg total) by mouth daily. 90 tablet 3  . lisinopril (PRINIVIL,ZESTRIL) 10 MG tablet Take 1 tablet (10 mg total) by mouth daily. 90 tablet 3  . metoprolol succinate (TOPROL XL) 25 MG 24 hr tablet Take 1 tablet (25 mg total) by mouth daily. 90 tablet 3  . rosuvastatin (CRESTOR) 10 MG tablet Take 1 tablet (10 mg total) by mouth at bedtime. 90 tablet 3  . spironolactone  (ALDACTONE) 25 MG tablet Take 0.5 tablets (12.5 mg total) by mouth daily. 15 tablet 11  . warfarin (COUMADIN) 5 MG tablet TAKE 1 TABLET (5 MG TOTAL) BY MOUTH AS DIRECTED. OR AS DIRECTED. 30 tablet 2   No current facility-administered medications on file prior to visit.     Review of Systems  Constitutional: Negative for other unusual diaphoresis or sweats HENT: Negative for ear discharge or swelling Eyes: Negative for other worsening visual disturbances Respiratory: Negative for stridor or other swelling  Gastrointestinal: Negative for worsening distension or other blood Genitourinary: Negative for retention or other urinary change Musculoskeletal: Negative for other MSK pain or swelling Skin: Negative for color change or other new lesions Neurological: Negative for worsening tremors and other numbness  Psychiatric/Behavioral: Negative for worsening agitation or other fatigue All other system neg per pt    Objective:   Physical Exam BP 106/64   Pulse 69   Temp 98.1 F (36.7 C) (Oral)   Ht 6\' 1"  (1.854 m)   Wt 195 lb (88.5 kg)   SpO2 98%   BMI 25.73 kg/m  VS noted, not ill appaering Constitutional: Pt appears in NAD HENT: Head: NCAT.  Right Ear: External ear normal.  Left Ear: External ear normal.  Eyes: . Pupils are equal, round, and reactive to light. Conjunctivae and EOM are normal Nose: without d/c or deformity Neck: Neck supple. Gross normal ROM Cardiovascular: Normal rate and regular rhythm.   Pulmonary/Chest: Effort normal and breath sounds without rales or wheezing.  Abd:  Soft, NT, ND, + BS, no organomegaly Neurological: Pt is alert. At baseline orientation, motor grossly intact Skin: Skin is warm. No rashes, other new lesions, no LE edema Psychiatric: Pt behavior is normal without agitation  No other exam findings    Assessment & Plan:

## 2017-04-22 NOTE — Patient Instructions (Addendum)
Please sign the forms for getting medical records form your preivous physicians.    You will be contacted regarding the referral for: MUGA scan (nuclear medicine), abdomen ultrasound, and referral to Dr Carlean Purl  Please continue all other medications as before, and refills have been done if requested.  Please have the pharmacy call with any other refills you may need.  Please continue your efforts at being more active, low cholesterol diet, and weight control.  You are otherwise up to date with prevention measures today.  Please keep your appointments with your specialists as you may have planned  Please go to the LAB in the Basement (turn left off the elevator) for the tests to be done today  You will be contacted by phone if any changes need to be made immediately.  Otherwise, you will receive a letter about your results with an explanation, but please check with MyChart first.  Please remember to sign up for MyChart if you have not done so, as this will be important to you in the future with finding out test results, communicating by private email, and scheduling acute appointments online when needed.  Please return in 4 months, or sooner if needed

## 2017-04-23 ENCOUNTER — Encounter: Payer: Self-pay | Admitting: Internal Medicine

## 2017-04-23 DIAGNOSIS — R972 Elevated prostate specific antigen [PSA]: Secondary | ICD-10-CM | POA: Insufficient documentation

## 2017-04-23 NOTE — Assessment & Plan Note (Addendum)
Stable exam, encouraged med compliance, will reorder MUGA scan, encouraged f/U with cardiology as planned  Note:  Total time for pt hx, exam, review of record with pt in the room, determination of diagnoses and plan for further eval and tx is > 40 min, with over 50% spent in coordination and counseling of patient including the differential dx, tx, further evaluation and other management of CHF, elevated bilirubin, diarrhea and constipation, hematochezia, elev psa, and HLD

## 2017-04-23 NOTE — Assessment & Plan Note (Signed)
?   IBS, will need prior GI records, refer local GI as well

## 2017-04-23 NOTE — Assessment & Plan Note (Signed)
Etiology unclear, for abd u/s and refer GI

## 2017-04-23 NOTE — Assessment & Plan Note (Signed)
Also for f/u psa today,  to f/u any worsening symptoms or concerns 

## 2017-04-23 NOTE — Assessment & Plan Note (Signed)
For f/u cbc today but doubt significant anemia

## 2017-04-23 NOTE — Assessment & Plan Note (Signed)
Lab Results  Component Value Date   LDLCALC 82 01/21/2017  stable overall by history and exam, recent data reviewed with pt, and pt to continue medical treatment as before,  to f/u any worsening symptoms or concerns

## 2017-04-23 NOTE — Assessment & Plan Note (Signed)
Also for referral GI

## 2017-04-24 ENCOUNTER — Encounter: Payer: Self-pay | Admitting: Internal Medicine

## 2017-04-24 ENCOUNTER — Ambulatory Visit
Admission: RE | Admit: 2017-04-24 | Discharge: 2017-04-24 | Disposition: A | Discharge status: Home / Self Care | Payer: Medicare Other | Source: Ambulatory Visit | Attending: Internal Medicine | Admitting: Internal Medicine

## 2017-04-24 ENCOUNTER — Telehealth: Payer: Self-pay | Admitting: Cardiovascular Disease

## 2017-04-24 DIAGNOSIS — K7689 Other specified diseases of liver: Secondary | ICD-10-CM | POA: Diagnosis not present

## 2017-04-24 DIAGNOSIS — R17 Unspecified jaundice: Secondary | ICD-10-CM

## 2017-04-24 NOTE — Telephone Encounter (Signed)
Informed patient that paper work would be at front desk for him to pick up at anytime.

## 2017-04-24 NOTE — Telephone Encounter (Signed)
New message    Patient states he is returning call from [facility] from 04/23/17

## 2017-04-24 NOTE — Telephone Encounter (Signed)
Patient wanted to know if he could get another disability parking placard. He stated that his will expire at the end of this month. Will give paper work to Dr. Johnsie Cancel for his approval.

## 2017-04-25 ENCOUNTER — Telehealth: Payer: Self-pay | Admitting: Internal Medicine

## 2017-04-25 NOTE — Telephone Encounter (Signed)
OK to let pt know, all labs normal or stable, except for the Triglycerides moderately elevated over 400.  Please follow a lower fat /lower cholesterol diet.   No other change in tx is needed.  Ok to send results in Mail if he likes.

## 2017-04-25 NOTE — Telephone Encounter (Signed)
Erby notated message was sent on Mychart, however patient doesn't have Mychart. Please call with results

## 2017-04-25 NOTE — Telephone Encounter (Signed)
Copied from [place]. Topic: Quick Communication - Lab Results >> Apr 25, 2017  2:57 PM Darl Householder, RMA wrote: Patient is requesting lab results

## 2017-04-25 NOTE — Telephone Encounter (Signed)
Please advise on result interpretation.

## 2017-04-26 ENCOUNTER — Telehealth: Payer: Self-pay | Admitting: Pharmacist

## 2017-04-26 DIAGNOSIS — I10 Essential (primary) hypertension: Secondary | ICD-10-CM

## 2017-04-26 NOTE — Telephone Encounter (Signed)
Called pt to see if he had picked up spironolactone after last visit. States he started taking this 2 days ago. Scheduled f/u BP check and will check BMET in 10 days, same day as INR check. Pt verbalized understanding.

## 2017-04-26 NOTE — Telephone Encounter (Signed)
Pt has been informed and expressed understanding.  

## 2017-04-30 ENCOUNTER — Telehealth: Payer: Self-pay | Admitting: Internal Medicine

## 2017-04-30 NOTE — Telephone Encounter (Signed)
Dr. Jenny Reichmann please advise on interpretations

## 2017-04-30 NOTE — Telephone Encounter (Signed)
Pt called requesting results from his ultra sound. Can you please call pt

## 2017-05-05 NOTE — Progress Notes (Addendum)
Patient ID: Jason Day                 DOB: 20-May-1940                      MRN: [id number]     HPI: Jason Day is a 77 y.o. male patient of Dr. Johnsie Cancel who presents today for HF medication optimization follow up. 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 At his last visit with pharmacy clinic he declined to start [facility] due to a list of complaints, including the cost ($365 per month). At his last visit he was started on spironolactone 12.5mg  daily and the RALES trial was printed for his wife.   He presents today for follow up BMET, BP check and INR. He reports that Dr. Jenny Reichmann did blood work a few weeks ago and is wondering if he needs blood work today. Explained that we need lab work to monitor spironolactone. He reports no problem on the new medication. He denies dizziness, chest pain, headaches, and SOB. He does report that he was dizzy a few days ago. He had to lay down for a while then he felt better.   Current HTN meds:  Furosemide 20mg  daily Lisinopril 10mg  daily  Metoprolol succinate 25mg  daily  Spironolactone 12.5mg  daily   BP goal: <130/58mmHg  Family History: Father with COPD and emphysema.  Social History: Former smoker, denies alcohol and illicit drug use.  Home Blood Pressures: Does not have monitor   Wt Readings from Last 3 Encounters:  04/22/17 195 lb (88.5 kg)  03/19/17 197 lb (89.4 kg)  03/08/17 199 lb 11.8 oz (90.6 kg)   BP Readings from Last 3 Encounters:  05/06/17 96/68  04/22/17 106/64  04/09/17 134/68   Pulse Readings from Last 3 Encounters:  05/06/17 (!) 55  04/22/17 69  04/09/17 (!) 56    Renal function: Estimated Creatinine Clearance: 45.5 mL/min (A) (by C-G formula based on SCr of 1.56 mg/dL (H)).  Past Medical History:  Diagnosis Date  . Atrial fibrillation ([place])   . Chronic combined systolic and diastolic heart failure ([place])   . Chronic sinus complaints    overuses afrin   . Coronary atherosclerosis of native coronary artery   . Hyperlipidemia     Current Outpatient Medications on File Prior to Visit  Medication Sig Dispense Refill  . aspirin EC 81 MG tablet Take 81 mg by mouth daily.    . digoxin (LANOXIN) 0.125 MG tablet Take 0.5 tablets (0.0625 mg total) by mouth daily. 45 tablet 3  . furosemide (LASIX) 20 MG tablet Take 1 tablet (20 mg total) by mouth daily. 90 tablet 3  . lisinopril (PRINIVIL,ZESTRIL) 10 MG tablet Take 1 tablet (10 mg total) by mouth daily. 90 tablet 3  . rosuvastatin (CRESTOR) 10 MG tablet Take 1 tablet (10 mg total) by mouth at bedtime. 90 tablet 3  . spironolactone (ALDACTONE) 25 MG tablet Take 0.5 tablets (12.5 mg total) by mouth daily. 15 tablet 11  . warfarin (COUMADIN) 5 MG tablet TAKE 1 TABLET (5 MG TOTAL) BY MOUTH AS DIRECTED. OR AS DIRECTED. 30 tablet 2   No current facility-administered medications on file prior to visit.     No Known Allergies  Blood pressure 96/68, pulse (!) 55.   Assessment/Plan: HF medication optimization: BMET today after new start spironolactone. HR today is low. BP today is also on low  side.  Will decrease metoprolol to 12.5mg  daily. Also asked him to call with any additional dizziness. Follow up in HTN clinic in 4 weeks with coumadin visit.    Thank you, Lelan Pons. Patterson Hammersmith, [facility]  05/06/2017 8:59 AM   ADDENDUM: Pt called stating that amiodarone was no longer on list and asking why not listed as he is currently taking. Clarified with Dr. Johnsie Cancel that pt was to stop amiodarone. LMOM to call back to instruct to stop amiodarone.

## 2017-05-06 ENCOUNTER — Ambulatory Visit (INDEPENDENT_AMBULATORY_CARE_PROVIDER_SITE_OTHER): Payer: Medicare Other | Admitting: Pharmacist

## 2017-05-06 ENCOUNTER — Other Ambulatory Visit: Payer: Medicare Other | Admitting: *Deleted

## 2017-05-06 VITALS — BP 96/68 | HR 55

## 2017-05-06 DIAGNOSIS — Z951 Presence of aortocoronary bypass graft: Secondary | ICD-10-CM | POA: Diagnosis not present

## 2017-05-06 DIAGNOSIS — I10 Essential (primary) hypertension: Secondary | ICD-10-CM

## 2017-05-06 DIAGNOSIS — I5042 Chronic combined systolic (congestive) and diastolic (congestive) heart failure: Secondary | ICD-10-CM

## 2017-05-06 DIAGNOSIS — Z5181 Encounter for therapeutic drug level monitoring: Secondary | ICD-10-CM

## 2017-05-06 LAB — BASIC METABOLIC PANEL
BUN/Creatinine Ratio: 17 (ref 10–24)
BUN: 28 mg/dL — AB (ref 8–27)
CO2: 25 mmol/L (ref 20–29)
CREATININE: 1.65 mg/dL — AB (ref 0.76–1.27)
Calcium: 9.7 mg/dL (ref 8.6–10.2)
Chloride: 98 mmol/L (ref 96–106)
GFR calc Af Amer: 46 mL/min/{1.73_m2} — ABNORMAL LOW (ref >59)
GFR calc non Af Amer: 40 mL/min/{1.73_m2} — ABNORMAL LOW (ref >59)
Glucose: 92 mg/dL (ref 65–99)
Potassium: 4.8 mmol/L (ref 3.5–5.2)
Sodium: 140 mmol/L (ref 134–144)

## 2017-05-06 LAB — POCT INR: INR: 2.2

## 2017-05-06 MED ORDER — METOPROLOL SUCCINATE ER 25 MG PO TB24: 12.5000 mg | ORAL_TABLET | Freq: Every day | ORAL | 3 refills | Status: DC

## 2017-05-06 NOTE — Patient Instructions (Signed)
Continue on same dosage 1/2 tablet daily except 1 tablet on Wednesdays and Saturdays. Recheck in 4 weeks. Coumadin Clinic#[phone] Main [phone]

## 2017-05-06 NOTE — Patient Instructions (Addendum)
DECREASE metoprolol succinate to 12.5mg  (1/2 tablet) daily  CONTINUE all other medications as prescribed.   Follow up in 4 week with INR check

## 2017-05-07 ENCOUNTER — Encounter: Payer: Medicare Other | Admitting: Internal Medicine

## 2017-05-24 ENCOUNTER — Telehealth: Payer: Self-pay | Admitting: Internal Medicine

## 2017-05-24 ENCOUNTER — Ambulatory Visit: Payer: Medicare Other | Admitting: Family

## 2017-05-24 DIAGNOSIS — Z0289 Encounter for other administrative examinations: Secondary | ICD-10-CM

## 2017-05-24 DIAGNOSIS — G5763 Lesion of plantar nerve, bilateral lower limbs: Secondary | ICD-10-CM | POA: Diagnosis not present

## 2017-05-24 DIAGNOSIS — M7752 Other enthesopathy of left foot: Secondary | ICD-10-CM | POA: Diagnosis not present

## 2017-05-24 DIAGNOSIS — M7751 Other enthesopathy of right foot: Secondary | ICD-10-CM | POA: Diagnosis not present

## 2017-05-24 DIAGNOSIS — M79676 Pain in unspecified toe(s): Secondary | ICD-10-CM

## 2017-05-24 NOTE — Telephone Encounter (Signed)
Received a msg from pt's wife stating he needs a referral to a podiatrist asap regaridng his toe. Not sure if its and ingrown toenail or whats going on with it.

## 2017-05-24 NOTE — Telephone Encounter (Signed)
Never mind. I made an appointment for him to come here to see Mickel Baas

## 2017-05-28 ENCOUNTER — Telehealth: Payer: Self-pay | Admitting: Cardiovascular Disease

## 2017-05-28 DIAGNOSIS — L600 Ingrowing nail: Secondary | ICD-10-CM | POA: Diagnosis not present

## 2017-05-28 NOTE — Telephone Encounter (Signed)
Spoke to pt again and he would like this referral for a podiatrist regarding his big toe. He did not come for the appt I scheduled with him last week.  Can you put referral in or will he need an appointment first?

## 2017-05-28 NOTE — Telephone Encounter (Signed)
°  New message  Pt verbalized that he is calling for RN  About a issue that she is already aware of pertaining to his foot

## 2017-05-28 NOTE — Telephone Encounter (Signed)
Patient wants to know if he needs to hold his coumadin for his foot. Informed patient that he needs to find out what kind of procedure he is going to have on his foot and have that doctor contact our office for medication clearance. Informed patient that it all depends on what he is having done, to determine how long he needs to hold medications. Patient verbalized understanding.

## 2017-05-28 NOTE — Telephone Encounter (Signed)
Ok, this is done 

## 2017-06-02 NOTE — Progress Notes (Signed)
Patient ID: Jason Day                 DOB: April 04, 1940                      MRN: [id number]     HPI: Jason Day is a 77 y.o. male patient of Dr. Johnsie Cancel who presents today for HF medication optimization follow up. 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 At his last visit with pharmacy clinic he declined to start [facility] due to a list of complaints, including the cost ($365 per month). At his last visit his metoprolol was decreased to 12.5mg  daily as pressure and HR were on low side.   He presents today for follow up. He reports that several times a week he gets dizzy and has to lay down to rest. He states this happens after lunch usually. He denies chest pain, SOB, and dizziness. He does not monitor his pressures at home.   Current HTN meds:  Furosemide 20mg  daily Lisinopril 10mg  daily  Metoprolol succinate 12.5mg  daily  Spironolactone 12.5mg  daily   BP goal: <130/39mmHg  Family History: Father with COPD and emphysema.  Social History: Former smoker, denies alcohol and illicit drug use.  Home Blood Pressures: Does not have monitor   Wt Readings from Last 3 Encounters:  04/22/17 195 lb (88.5 kg)  03/19/17 197 lb (89.4 kg)  03/08/17 199 lb 11.8 oz (90.6 kg)   BP Readings from Last 3 Encounters:  06/03/17 112/74  05/06/17 96/68  04/22/17 106/64   Pulse Readings from Last 3 Encounters:  06/03/17 (!) 59  05/06/17 (!) 55  04/22/17 69    Renal function: CrCl cannot be calculated (Patient's most recent lab result is older than the maximum 21 days allowed.).  Past Medical History:  Diagnosis Date  . Atrial fibrillation ([place])   . Chronic combined systolic and diastolic heart failure ([place])   . Chronic sinus complaints    overuses afrin  . Coronary atherosclerosis of native coronary artery   . Hyperlipidemia     Current Outpatient Medications on File Prior to Visit  Medication Sig Dispense Refill  .  aspirin EC 81 MG tablet Take 81 mg by mouth daily.    . digoxin (LANOXIN) 0.125 MG tablet Take 0.5 tablets (0.0625 mg total) by mouth daily. 45 tablet 3  . furosemide (LASIX) 20 MG tablet Take 1 tablet (20 mg total) by mouth daily. 90 tablet 3  . lisinopril (PRINIVIL,ZESTRIL) 10 MG tablet Take 1 tablet (10 mg total) by mouth daily. 90 tablet 3  . metoprolol succinate (TOPROL XL) 25 MG 24 hr tablet Take 0.5 tablets (12.5 mg total) by mouth daily. 90 tablet 3  . rosuvastatin (CRESTOR) 10 MG tablet Take 1 tablet (10 mg total) by mouth at bedtime. 90 tablet 3  . spironolactone (ALDACTONE) 25 MG tablet Take 0.5 tablets (12.5 mg total) by mouth daily. 15 tablet 11  . warfarin (COUMADIN) 5 MG tablet TAKE 1 TABLET (5 MG TOTAL) BY MOUTH AS DIRECTED. OR AS DIRECTED. 30 tablet 2   No current facility-administered medications on file prior to visit.     No Known Allergies  Blood pressure 112/74, pulse (!) 59.   Assessment/Plan: HF medication optimization: BP today is at goal and better than last visit. HR is improved on current doses as well. Will continue medications as prescribed. Have asked that he monitor  dizziness and call if changes. We can check on him with next INR if needed and he is scheduled to follow up with Dr. Johnsie Cancel in May.    Thank you, Lelan Pons. Patterson Hammersmith, [facility]  06/03/2017 2:45 PM

## 2017-06-03 ENCOUNTER — Ambulatory Visit (INDEPENDENT_AMBULATORY_CARE_PROVIDER_SITE_OTHER): Payer: Medicare Other | Admitting: Pharmacist

## 2017-06-03 ENCOUNTER — Encounter: Payer: Self-pay | Admitting: Pharmacist

## 2017-06-03 ENCOUNTER — Ambulatory Visit (INDEPENDENT_AMBULATORY_CARE_PROVIDER_SITE_OTHER): Payer: Self-pay | Admitting: Pharmacist

## 2017-06-03 VITALS — BP 112/74 | HR 59

## 2017-06-03 DIAGNOSIS — Z5181 Encounter for therapeutic drug level monitoring: Secondary | ICD-10-CM

## 2017-06-03 DIAGNOSIS — I4891 Unspecified atrial fibrillation: Secondary | ICD-10-CM | POA: Diagnosis not present

## 2017-06-03 DIAGNOSIS — I25758 Atherosclerosis of native coronary artery of transplanted heart with other forms of angina pectoris: Secondary | ICD-10-CM

## 2017-06-03 DIAGNOSIS — I5041 Acute combined systolic (congestive) and diastolic (congestive) heart failure: Secondary | ICD-10-CM

## 2017-06-03 DIAGNOSIS — Z951 Presence of aortocoronary bypass graft: Secondary | ICD-10-CM

## 2017-06-03 LAB — POCT INR: INR: 1.8

## 2017-06-03 NOTE — Patient Instructions (Signed)
CONTINUE your medications as prescribed.   Monitor the dizziness and call with any issues or if the dizziness gets worse.   We will check in on you in 3 weeks with your coumadin visit and schedule additional follow up if needed.

## 2017-06-03 NOTE — Patient Instructions (Signed)
Take 1 tablet today and then continue on same dosage 1/2 tablet daily except 1 tablet on Monday, Wednesday, and Saturday. Recheck in 3 weeks. Coumadin Clinic#[phone] Main [phone]

## 2017-06-05 ENCOUNTER — Encounter (INDEPENDENT_AMBULATORY_CARE_PROVIDER_SITE_OTHER): Payer: Self-pay

## 2017-06-05 ENCOUNTER — Encounter: Payer: Self-pay | Admitting: Internal Medicine

## 2017-06-05 ENCOUNTER — Ambulatory Visit (INDEPENDENT_AMBULATORY_CARE_PROVIDER_SITE_OTHER): Payer: Medicare Other | Admitting: Internal Medicine

## 2017-06-05 VITALS — BP 88/68 | HR 51 | Ht 71.26 in | Wt 188.0 lb

## 2017-06-05 DIAGNOSIS — K648 Other hemorrhoids: Secondary | ICD-10-CM | POA: Diagnosis not present

## 2017-06-05 DIAGNOSIS — Z7901 Long term (current) use of anticoagulants: Secondary | ICD-10-CM

## 2017-06-05 DIAGNOSIS — K644 Residual hemorrhoidal skin tags: Secondary | ICD-10-CM | POA: Diagnosis not present

## 2017-06-05 DIAGNOSIS — K582 Mixed irritable bowel syndrome: Secondary | ICD-10-CM | POA: Diagnosis not present

## 2017-06-05 DIAGNOSIS — I482 Chronic atrial fibrillation, unspecified: Secondary | ICD-10-CM

## 2017-06-05 MED ORDER — HYDROCORTISONE ACETATE 25 MG RE SUPP: 25.0000 mg | Freq: Every day | RECTAL | 0 refills | Status: DC

## 2017-06-05 MED ORDER — HYDROCORTISONE 2.5 % RE CREA: 1.0000 "application " | TOPICAL_CREAM | Freq: Every day | RECTAL | 1 refills | Status: DC

## 2017-06-05 NOTE — Patient Instructions (Addendum)
  Normal BMI (Body Mass Index- based on height and weight) is between 23 and 30. Your BMI today is Body mass index is 26.03 kg/m. Marland Kitchen Please consider follow up  regarding your BMI with your Primary Care Provider.   We have sent the following medications to your pharmacy for you to pick up at your convenience: Anusol cream has been resent due to the high cost of the suppositories. ( I have left messages on both patient and his wife's #'s in regards to this)   Dr Carlean Purl is going to speak with Dr Johnsie Cancel about holding your blood thinner for hemorrhoid banding.   Please take benefiber, 1 tablespoon at bedtime nightly. Handout provided.   I appreciate the opportunity to care for you. Silvano Rusk, MD, [facility]

## 2017-06-05 NOTE — Progress Notes (Signed)
Jason Day 77 y.o. 08/27/1940 [id number]  Assessment & Plan:   Encounter Diagnoses  Name Primary?  . Internal and external bleeding hemorrhoids Yes  . Irritable bowel syndrome with both constipation and diarrhea   . Chronic anticoagulation - warfarin   . Chronic atrial fibrillation ([place])   . Gilbert's syndrome     He is clearly having bleeding hemorrhoids.  Will start treatment with hydrocortisone suppositories, if these are unaffordable we will switch to cream.  Benefiber nightly to try to promote more regular defecation.  I think we need to consider banding of the hemorrhoids, the bleeding has intensified since starting warfarin.  However the warfarin is an issue, it needs to be held probably 10 days total 3 before the procedure and 7 after.  That will reduce but not eliminate the risk of post-banding bleeding.  I would aim to bland all 3 hemorrhoids at once to limit the time off warfarin.  We will contact him after I clarify with Dr. Johnsie Cancel as per HPI.  His cardiologist as to the appropriateness of holding the warfarin.  I have explained there can be problems with a stroke off the warfarin though very rare.  This is an increased risk of the procedure.  If he has a fantastic response to the hydrocortisone then perhaps we will need to do this but given that he has a grade 3 hemorrhoid I doubt that will be the case.  Further follow-up with the IBS as well.  Hopefully the fiber supplementation will make a difference.  We will need to have more of a discussion about toilet habit as well.  Should these measures not make enough of a difference would need to consider another colonoscopy but he had one in 2016 with a very good prep and mild diverticulosis and hemorrhoids only.  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 CC: Biagio Borg, MD Jenkins Rouge MD  Subjective:     Chief Complaint:` Rectal bleeding and change in bowel habits  HPI The patient is a 77 year old married white man here with his wife because of a complaint of rectal bleeding and change in bowel habits.  He has had a long history of hemorrhoids with some intermittent rectal bleeding, He has had intensification of the symptoms since going on warfarin for atrial fibrillation and having a coronary artery bypass graft surgery in August 2018.  He has had hemorrhoidal banding in the past and he had a colonoscopy for screening when he was living in [place] in 2016, report reviewed diverticulosis and prominent internal hemorrhoids noted.  That was on 03/23/2014.  He is also describing several months of an recurrence of post defecation repeat defecation.  It sounds like there is a background of relatively chronic mild constipation sometimes treated with Dulcolax and may be MiraLAX.  His wife says he has a terrible diet he does not eat much fiber, though he is also now avoiding green vegetables because of the warfarin.  Recent INR 1.8 with adjustment in dose.  He feels like he is gassy and bloated at times as well.  Sometimes he has urgent defecation after that initial morning bowel movement.  Twice he has had black stools but he has used some Pepto-Bismol.  He will have difficulty starting and defecating with harder formed stools and then it will get looser.  The bleeding occurs with dripping and he  has to put toilet paper into his anal area to catch the bleeding.  It is bright red in color.  He does not describe significant abdominal pain.  There is no clear dietary trigger.  No anal or rectal pain.  He is not describing overt prolapse.  Hemoglobin 14 1 month ago. He does have a chronic isolated hyperbilirubinemia that is indirect,  GI review of systems seems otherwise negative. No Known Allergies Current Meds  Medication Sig  . aspirin EC 81 MG tablet Take 81 mg by mouth daily.  . digoxin (LANOXIN) 0.125  MG tablet Take 0.5 tablets (0.0625 mg total) by mouth daily.  . furosemide (LASIX) 20 MG tablet Take 1 tablet (20 mg total) by mouth daily.  Marland Kitchen lisinopril (PRINIVIL,ZESTRIL) 10 MG tablet Take 1 tablet (10 mg total) by mouth daily.  . metoprolol succinate (TOPROL XL) 25 MG 24 hr tablet Take 0.5 tablets (12.5 mg total) by mouth daily.  . rosuvastatin (CRESTOR) 10 MG tablet Take 1 tablet (10 mg total) by mouth at bedtime.  Marland Kitchen spironolactone (ALDACTONE) 25 MG tablet Take 0.5 tablets (12.5 mg total) by mouth daily.  Marland Kitchen warfarin (COUMADIN) 5 MG tablet TAKE 1 TABLET (5 MG TOTAL) BY MOUTH AS DIRECTED. OR AS DIRECTED.   Past Medical History:  Diagnosis Date  . Atrial fibrillation ([place])   . Bleeding hemorrhoids   . Chronic combined systolic and diastolic heart failure ([place])   . Chronic sinus complaints    overuses afrin  . Coronary atherosclerosis of native coronary artery   . Hyperlipidemia    Past Surgical History:  Procedure Laterality Date  . BACK SURGERY  ~2014   disc repair  . CHOLECYSTECTOMY    . COLONOSCOPY  04/06/2014   Hemorrhoids and diverticulosis performed in [place]  . CORONARY ARTERY BYPASS GRAFT N/A 10/18/2016   Procedure: CORONARY ARTERY BYPASS GRAFTING (CABG) x five , using left internal mammary artery and right leg greater saphenous vein harvested endoscopically;  Surgeon: Gaye Pollack, MD;  Location: [facility] OR;  Service: Open Heart Surgery;  Laterality: N/A;  . FOOT SURGERY    . HEMORRHOID SURGERY    . RIGHT/LEFT HEART CATH AND CORONARY ANGIOGRAPHY N/A 10/16/2016   Procedure: RIGHT/LEFT HEART CATH AND CORONARY ANGIOGRAPHY;  Surgeon: Belva Crome, MD;  Location: [facility] CV LAB;  Service: Cardiovascular;  Laterality: N/A;  . stents in liver    . TEE WITHOUT CARDIOVERSION N/A 10/18/2016   Procedure: TRANSESOPHAGEAL ECHOCARDIOGRAM (TEE);  Surgeon: Gaye Pollack, MD;  Location: [facility];  Service: Open Heart Surgery;  Laterality: N/A;   Social History   Social History  Narrative   Married    Originally from [place] moved to [place] age 57 moved to [place]   1 son   1 estranged daughter      Has living will   Wife is health care POA--then son   Would accept resuscitation   No tube feeds if cognitively unaware   family history includes COPD in his father; Emphysema in his father; Healthy in his brother.   Review of Systems He feels like he urinates frequently or excessively. All other review of systems negative or as per HPI Objective:   Physical Exam @BP  (!) 88/68   Pulse (!) 51   Ht 5' 11.26" (1.81 m)   Wt 188 lb (85.3 kg)   BMI 26.03 kg/m @  General:  Well-developed, well-nourished and in no acute distress Eyes:  anicteric. ENT:   Mouth and posterior  pharynx free of lesions.  Neck:   supple w/o thyromegaly or mass.  Lungs: Clear to auscultation bilaterally. Heart:  S1S2, no rubs, murmurs, gallops. Abdomen:  soft, non-tender, no hepatosplenomegaly, hernia, or mass and BS+.  There is an upper midline diastases recti Rectal:  He has small anal tags and a protruding right posterior prolapsed hemorrhoid that is inflamed.  Digital exam nontender there was formed hard stool brown in the rectal vault without mass.  Slightly lax anal sphincter.  A anoscopy is performed demonstrating the grade 3 right posterior and grade 2 right anterior and left lateral internal/external hemorrhoid complexes.  They are all inflamed with stigmata of bleeding.  Lymph:  no cervical or supraclavicular adenopathy. Extremities:   no edema, cyanosis or clubbing Skin   no rash. Neuro:  A&O x 3.  Psych:  appropriate mood and  Affect.   Data Reviewed:  See HPI I have reviewed cardiology notes recent anticoagulation notes GI notes from [place], labs in the computer.  Discharge summary from August also reviewed.  Abdominal ultrasound including images viewed from 04/24/2017 demonstrating probable fatty liver, postcholecystectomy dilated bile duct and an  ectatic abdominal aorta

## 2017-06-07 DIAGNOSIS — K582 Mixed irritable bowel syndrome: Secondary | ICD-10-CM | POA: Insufficient documentation

## 2017-06-07 DIAGNOSIS — K589 Irritable bowel syndrome without diarrhea: Secondary | ICD-10-CM

## 2017-06-07 HISTORY — DX: Irritable bowel syndrome, unspecified: K58.9

## 2017-06-10 ENCOUNTER — Telehealth: Payer: Self-pay

## 2017-06-10 NOTE — Telephone Encounter (Signed)
Left message for patient to call back  

## 2017-06-10 NOTE — Telephone Encounter (Signed)
-----   Message from Gatha Mayer, MD sent at 06/09/2017  5:59 PM EDT ----- Regarding: RE: holding warfarin? Thanks Laurey Arrow  I will have my staff set up a banding for him  [employer] - please schedule him for a banding in May when I have a slot  He should hold warfarin 3 d before  CEG ----- Message ----- From: Josue Hector, MD Sent: 06/09/2017  12:13 PM To: Gatha Mayer, MD Subject: RE: holding warfarin?                          Holding coumadin with no lovenox bridge would be fine  ----- Message ----- From: Gatha Mayer, MD Sent: 06/07/2017   9:54 AM To: Josue Hector, MD Subject: holding warfarin?                              This nice man would benefit from hemorrhoidal banding I think. Please see my last note  He is on warfarin for Afib  With banding risk of bleeding usu afterwards - when the tissue ulcerates  I usu hold warfarin 3 d before and about 5-7 d after Would this be an acceptable risk in him? I do think his chronic bleeding from these could be problematic   Thanks  Glendell Docker

## 2017-06-12 NOTE — Telephone Encounter (Signed)
I spoke with the patient and he wants to wait until he has an appointment with his podiatrist and try and coordinate coming off warfarin.  He will call back

## 2017-06-13 ENCOUNTER — Encounter: Payer: Self-pay | Admitting: Podiatry

## 2017-06-13 ENCOUNTER — Ambulatory Visit (INDEPENDENT_AMBULATORY_CARE_PROVIDER_SITE_OTHER): Payer: Medicare Other | Admitting: Podiatry

## 2017-06-13 VITALS — BP 150/84 | HR 56

## 2017-06-13 DIAGNOSIS — B351 Tinea unguium: Secondary | ICD-10-CM | POA: Diagnosis not present

## 2017-06-13 DIAGNOSIS — D689 Coagulation defect, unspecified: Secondary | ICD-10-CM

## 2017-06-13 DIAGNOSIS — M79609 Pain in unspecified limb: Secondary | ICD-10-CM

## 2017-06-13 NOTE — Progress Notes (Signed)
Subjective:  Patient ID: Jason Day, male    DOB: [birth year],  MRN: [id number]  Chief Complaint  Patient presents with  . Nail Problem    Lt hallux nail is painful, patient on blood thinners   77 y.o. male returns for the above complaint.  Reports painful elongated toenails.  Moved from [place] where he was receiving care.  On chronic anticoagulation-Coumadin-for afibrillation.  Denies other pedal issues today  Past Medical History:  Diagnosis Date  . Atrial fibrillation ([place])   . Bleeding hemorrhoids   . Chronic combined systolic and diastolic heart failure ([place])   . Chronic sinus complaints    overuses afrin  . Coronary atherosclerosis of native coronary artery   . Hyperlipidemia    Past Surgical History:  Procedure Laterality Date  . BACK SURGERY  ~2014   disc repair  . CHOLECYSTECTOMY    . COLONOSCOPY  04/06/2014   Hemorrhoids and diverticulosis performed in [place]  . CORONARY ARTERY BYPASS GRAFT N/A 10/18/2016   Procedure: CORONARY ARTERY BYPASS GRAFTING (CABG) x five , using left internal mammary artery and right leg greater saphenous vein harvested endoscopically;  Surgeon: Gaye Pollack, MD;  Location: [facility];  Service: Open Heart Surgery;  Laterality: N/A;  . FOOT SURGERY    . HEMORRHOID SURGERY    . RIGHT/LEFT HEART CATH AND CORONARY ANGIOGRAPHY N/A 10/16/2016   Procedure: RIGHT/LEFT HEART CATH AND CORONARY ANGIOGRAPHY;  Surgeon: Belva Crome, MD;  Location: [facility];  Service: Cardiovascular;  Laterality: N/A;  . stents in liver    . TEE WITHOUT CARDIOVERSION N/A 10/18/2016   Procedure: TRANSESOPHAGEAL ECHOCARDIOGRAM (TEE);  Surgeon: Gaye Pollack, MD;  Location: [facility];  Service: Open Heart Surgery;  Laterality: N/A;    Current Outpatient Medications:  .  amiodarone (PACERONE) 200 MG tablet, Take 200 mg by mouth daily., Disp: , Rfl: 6 .  aspirin EC 81 MG tablet, Take 81 mg by mouth daily., Disp: , Rfl:  .  digoxin (LANOXIN) 0.125 MG tablet, Take 0.5  tablets (0.0625 mg total) by mouth daily., Disp: 45 tablet, Rfl: 3 .  furosemide (LASIX) 20 MG tablet, Take 1 tablet (20 mg total) by mouth daily., Disp: 90 tablet, Rfl: 3 .  hydrocortisone (ANUSOL-HC) 2.5 % rectal cream, Place 1 application rectally at bedtime. X 2 weeks then as needed - may apply by placing on hemorrhoidal OTC suppository and inserting, Disp: 30 g, Rfl: 1 .  lisinopril (PRINIVIL,ZESTRIL) 10 MG tablet, Take 1 tablet (10 mg total) by mouth daily., Disp: 90 tablet, Rfl: 3 .  metoprolol succinate (TOPROL XL) 25 MG 24 hr tablet, Take 0.5 tablets (12.5 mg total) by mouth daily., Disp: 90 tablet, Rfl: 3 .  rosuvastatin (CRESTOR) 10 MG tablet, Take 1 tablet (10 mg total) by mouth at bedtime., Disp: 90 tablet, Rfl: 3 .  spironolactone (ALDACTONE) 25 MG tablet, Take 0.5 tablets (12.5 mg total) by mouth daily., Disp: 15 tablet, Rfl: 11 .  warfarin (COUMADIN) 5 MG tablet, TAKE 1 TABLET (5 MG TOTAL) BY MOUTH AS DIRECTED. OR AS DIRECTED., Disp: 30 tablet, Rfl: 2  No Known Allergies All systems reviewed negative except as noted in HPI  Objective:   Vitals:   06/13/17 1014  BP: (!) 150/84  Pulse: (!) 56   General AA&O x3. Normal mood and affect.  Vascular Pedal pulses palpable.  Neurologic Epicritic sensation grossly intact.  Dermatologic No open lesions. Skin normal texture and turgor. Toenails x 10 elongated, thickened, dystrophic.  Orthopedic: Pain to palpation about the toenails.   Assessment & Plan:  Patient was evaluated and treated and all questions answered.  Onychomycosis with coagulation defect. -Nails palliatively debrided as below. -Educated on self-care  Procedure: Nail Debridement Rationale: pain  Type of Debridement: manual, sharp debridement. Instrumentation: Nail nipper, rotary burr. Number of Nails: 10  Return in about 2 months (around 08/15/2017) for Routine Foot Care - On AC.

## 2017-06-17 ENCOUNTER — Ambulatory Visit (HOSPITAL_COMMUNITY): Payer: Medicare Other | Attending: Cardiology

## 2017-06-17 ENCOUNTER — Other Ambulatory Visit: Payer: Self-pay | Admitting: Cardiovascular Disease

## 2017-06-17 DIAGNOSIS — I5042 Chronic combined systolic (congestive) and diastolic (congestive) heart failure: Secondary | ICD-10-CM | POA: Diagnosis not present

## 2017-06-17 DIAGNOSIS — R9430 Abnormal result of cardiovascular function study, unspecified: Secondary | ICD-10-CM | POA: Insufficient documentation

## 2017-06-17 MED ORDER — TECHNETIUM TC 99M-LABELED RED BLOOD CELLS IV KIT
30.6000 | PACK | Freq: Once | INTRAVENOUS | Status: AC | PRN
  Administered 2017-06-17: 30.6 via INTRAVENOUS
  Filled 2017-06-17: qty 30.6

## 2017-06-21 ENCOUNTER — Telehealth: Payer: Self-pay | Admitting: Internal Medicine

## 2017-06-21 MED ORDER — DICYCLOMINE HCL 10 MG PO CAPS: 10.0000 mg | ORAL_CAPSULE | Freq: Three times a day (TID) | ORAL | 3 refills | Status: DC

## 2017-06-21 NOTE — Telephone Encounter (Signed)
Dicyclomine 10 mg qac prn # 90 3 RF  Does he want to proceed with banding - See prior notes but I got an ok to hold warfarin from Dr. Johnsie Cancel

## 2017-06-21 NOTE — Telephone Encounter (Signed)
Patient states someone called him but no report in Epic. Pt states it could have been about scheduling a banding, but he also has other questions for nurse. Pt would not discuss questions further.

## 2017-06-21 NOTE — Telephone Encounter (Signed)
rx sent Left message for patient to call back He is already scheduled for hem banding #1

## 2017-06-21 NOTE — Telephone Encounter (Signed)
Patient having trouble with his IBS urgency and diarrhea after meals.  He and wife are asking for an antispasmodic??

## 2017-06-24 ENCOUNTER — Encounter: Payer: Self-pay | Admitting: Cardiovascular Disease

## 2017-06-24 ENCOUNTER — Ambulatory Visit (INDEPENDENT_AMBULATORY_CARE_PROVIDER_SITE_OTHER): Payer: Medicare Other | Admitting: Cardiovascular Disease

## 2017-06-24 ENCOUNTER — Ambulatory Visit (INDEPENDENT_AMBULATORY_CARE_PROVIDER_SITE_OTHER): Payer: Medicare Other | Admitting: Pharmacist

## 2017-06-24 VITALS — BP 112/66 | HR 57 | Ht 72.0 in | Wt 188.0 lb

## 2017-06-24 DIAGNOSIS — Z951 Presence of aortocoronary bypass graft: Secondary | ICD-10-CM | POA: Diagnosis not present

## 2017-06-24 DIAGNOSIS — E785 Hyperlipidemia, unspecified: Secondary | ICD-10-CM

## 2017-06-24 DIAGNOSIS — I4891 Unspecified atrial fibrillation: Secondary | ICD-10-CM | POA: Diagnosis not present

## 2017-06-24 DIAGNOSIS — I255 Ischemic cardiomyopathy: Secondary | ICD-10-CM

## 2017-06-24 DIAGNOSIS — I5023 Acute on chronic systolic (congestive) heart failure: Secondary | ICD-10-CM | POA: Diagnosis not present

## 2017-06-24 DIAGNOSIS — I10 Essential (primary) hypertension: Secondary | ICD-10-CM

## 2017-06-24 DIAGNOSIS — I25810 Atherosclerosis of coronary artery bypass graft(s) without angina pectoris: Secondary | ICD-10-CM | POA: Diagnosis not present

## 2017-06-24 DIAGNOSIS — Z5181 Encounter for therapeutic drug level monitoring: Secondary | ICD-10-CM

## 2017-06-24 LAB — POCT INR: INR: 2.1

## 2017-06-24 NOTE — Progress Notes (Signed)
06/24/2017 Jason Day   [birth year]  [id number]  Primary Physician Jenny Reichmann Hunt Oris, MD Primary Cardiologist: Dr. Johnsie Cancel   Reason for Visit/CC: f/u for acute on chronic systolic HF  HPI:  Jason Day is a 77 y.o. 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 He initially presented w/ acute pulmonary edema and diuresed. Echo showed reduced LVEF, down to 30%, leading to LHC, which showed severe multivessel CAD. 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 He had post op afib and started on amiodarne and coumadin. He is now in NSR. He has been on guidelines medical therapy for systolic HF  But refused to start entresto   He is claustrophobic and cannot have MRI.   Last echo on medical Rx 03/11/17 showed EF 35% severe LVH mild to moderate MR   MUGA Scan 06/17/17 EF 35%   Has some irritable bowel will be having colonoscopy with Dr Carlean Purl at end of month And hold coumadin with no bridging   Current Meds  Medication Sig  . amiodarone (PACERONE) 200 MG tablet Take 200 mg by mouth daily.  Marland Kitchen aspirin EC 81 MG tablet Take 81 mg by mouth daily.  Marland Kitchen dicyclomine (BENTYL) 10 MG capsule Take 1 capsule (10 mg total) by mouth 3 (three) times daily before meals.  . digoxin (LANOXIN) 0.125 MG tablet Take 0.5 tablets (0.0625 mg total) by mouth daily.  . furosemide (LASIX) 20 MG tablet Take 1 tablet (20 mg total) by mouth daily.  . hydrocortisone (ANUSOL-HC) 2.5 % rectal cream Place 1 application rectally at bedtime. X 2 weeks then as needed - may apply by placing on hemorrhoidal OTC suppository and inserting  . lisinopril (PRINIVIL,ZESTRIL) 10 MG tablet Take 1 tablet (10 mg total) by mouth daily.  . metoprolol succinate (TOPROL XL) 25 MG 24 hr tablet Take 0.5 tablets (12.5 mg total) by mouth daily.  . rosuvastatin (CRESTOR) 10 MG tablet Take 1 tablet (10 mg total) by mouth at bedtime.  Marland Kitchen spironolactone (ALDACTONE) 25 MG tablet Take 0.5 tablets (12.5 mg total) by mouth daily.  Marland Kitchen warfarin (COUMADIN) 5 MG tablet TAKE 1 TABLET (5 MG  TOTAL) BY MOUTH AS DIRECTED. OR AS DIRECTED.   No Known Allergies Past Medical History:  Diagnosis Date  . Atrial fibrillation ([place])   . Bleeding hemorrhoids   . Chronic combined systolic and diastolic heart failure ([place])   . Chronic sinus complaints    overuses afrin  . Coronary atherosclerosis of native coronary artery   . Hyperlipidemia    Family History  Problem Relation Age of Onset  . COPD Father   . Emphysema Father   . Healthy Brother   . Diabetes Neg Hx   . Cancer Neg Hx   . Stomach cancer Neg Hx   . Colon cancer Neg Hx    Past Surgical History:  Procedure Laterality Date  . BACK SURGERY  ~2014   disc repair  . CHOLECYSTECTOMY    . COLONOSCOPY  04/06/2014   Hemorrhoids and diverticulosis performed in [place]  . CORONARY ARTERY BYPASS GRAFT N/A 10/18/2016   Procedure: CORONARY ARTERY BYPASS GRAFTING (CABG) x five , using left internal mammary artery and right leg greater saphenous vein harvested endoscopically;  Surgeon: Gaye Pollack, MD;  Location: [facility];  Service: Open Heart Surgery;  Laterality: N/A;  . FOOT SURGERY    . HEMORRHOID SURGERY    . RIGHT/LEFT HEART CATH AND CORONARY ANGIOGRAPHY N/A 10/16/2016   Procedure: RIGHT/LEFT HEART CATH AND CORONARY ANGIOGRAPHY;  Surgeon: Daneen Schick  W, MD;  Location: [facility];  Service: Cardiovascular;  Laterality: N/A;  . stents in liver    . TEE WITHOUT CARDIOVERSION N/A 10/18/2016   Procedure: TRANSESOPHAGEAL ECHOCARDIOGRAM (TEE);  Surgeon: Gaye Pollack, MD;  Location: [facility];  Service: Open Heart Surgery;  Laterality: N/A;   Social History   Socioeconomic History  . Marital status: Married    Spouse name: Not on file  . Number of children: 2  . Years of education: Not on file  . Highest education level: Not on file  Occupational History  . 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 Financial resource strain: Not on file  . Food insecurity:    Worry: Not on file     Inability: Not on file  . Transportation needs:    Medical: Not on file    Non-medical: Not on file  Tobacco Use  . Smoking status: Former Research scientist (life sciences)  . Smokeless tobacco: Never Used  . Tobacco comment: Smoked on and off  Substance and Sexual Activity  . Alcohol use: No  . Drug use: No  . Sexual activity: Yes  Lifestyle  . Physical activity:    Days per week: Not on file    Minutes per session: Not on file  . Stress: Not on file  Relationships  . Social connections:    Talks on phone: Not on file    Gets together: Not on file    Attends religious service: Not on file    Active member of club or organization: Not on file    Attends meetings of clubs or organizations: Not on file    Relationship status: Not on file  . Intimate partner violence:    Fear of current or ex partner: Not on file    Emotionally abused: Not on file    Physically abused: Not on file    Forced sexual activity: Not on file  Other Topics Concern  . Not on file  Social History Narrative   Married    Originally from [place] moved to [place] age 31 moved to [place]   1 son   1 estranged daughter      Has living will   Wife is health care POA--then son   Would accept resuscitation   No tube feeds if cognitively unaware     Review of Systems: General: negative for chills, fever, night sweats or weight changes.  Cardiovascular: negative for chest pain, dyspnea on exertion, edema, orthopnea, palpitations, paroxysmal nocturnal dyspnea or shortness of breath Dermatological: negative for rash Respiratory: negative for cough or wheezing Urologic: negative for hematuria Abdominal: negative for nausea, vomiting, diarrhea, bright red blood per rectum, melena, or hematemesis Neurologic: negative for visual changes, syncope, or dizziness All other systems reviewed and are otherwise negative except as noted above.   Physical Exam:  Affect appropriate Chronically ill white male  HEENT: normal Neck  supple with no adenopathy JVP normal no bruits no thyromegaly Lungs clear with no wheezing and good diaphragmatic motion Heart:  S1/S2 MR  murmur, no rub, gallop or click PMI increased  Abdomen: benighn, BS positve, no tenderness, no AAA no bruit.  No HSM or HJR Distal pulses intact with no bruits Trace LE edema Neuro non-focal Skin warm and dry No muscular weakness   EKG NSR rate 26 old IMI 01/07/17   ASSESSMENT AND PLAN:   1. Acute on Chronic Systolic HF: patent refused to start Entresto prescribed 02/2017  wants To continue current meds   2. 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 And 35% echo 03/11/17  MUGA scan 35% does not want AICD and currently not needed   3. CAD: s/p CABG 09/2016. Stable w/o anginal symptoms. Continue ASA, BB and statin.   4. HTN: tends to run low with some dizziness    5. PAF: maintaining NSR w/ amiodarone. He is on coumadin for a/c. INRs are followed in our office.  Rx today 2.1 no need for lovenox bridging   6. HLD: LDL in August was 112 mg/dL. He is now on statin therapy with Crestor. Goal LDL is <70 mg/dL. Plan for f/u FLP and HFTs, scheduled 12/2. If not at goal, he will need further increase in Crestor dose. Currently at 10 mg.   7. Irritable bowel:  Colonoscopy latter this month better with bentyl  F/u Budd Palmer

## 2017-06-24 NOTE — Patient Instructions (Signed)

## 2017-06-24 NOTE — Patient Instructions (Signed)
Continue on same dosage 1/2 tablet daily except 1 tablet on Monday, Wednesday, and Saturday. Last dose of Coumadin will be on May 25 prior to upcoming procedure. Recheck in 5 weeks- one week after your upcoming procedure. Cou[facility] Clinic#[phone] Main [phone].

## 2017-07-05 ENCOUNTER — Other Ambulatory Visit: Payer: Self-pay | Admitting: Nurse Practitioner

## 2017-07-05 DIAGNOSIS — Z5189 Encounter for other specified aftercare: Principal | ICD-10-CM

## 2017-07-05 DIAGNOSIS — Z5181 Encounter for therapeutic drug level monitoring: Secondary | ICD-10-CM

## 2017-07-16 ENCOUNTER — Telehealth: Payer: Self-pay | Admitting: Internal Medicine

## 2017-07-16 NOTE — Telephone Encounter (Signed)
I left a detailed message that will not need pain meds or numbing cream for hem banding.  He is asked to call back for additional questions or concerns.

## 2017-07-17 ENCOUNTER — Encounter: Payer: Self-pay | Admitting: Internal Medicine

## 2017-07-17 ENCOUNTER — Telehealth: Payer: Self-pay | Admitting: Cardiovascular Disease

## 2017-07-17 ENCOUNTER — Ambulatory Visit (INDEPENDENT_AMBULATORY_CARE_PROVIDER_SITE_OTHER): Payer: Medicare Other | Admitting: Internal Medicine

## 2017-07-17 DIAGNOSIS — K648 Other hemorrhoids: Secondary | ICD-10-CM

## 2017-07-17 DIAGNOSIS — K644 Residual hemorrhoidal skin tags: Secondary | ICD-10-CM

## 2017-07-17 NOTE — Telephone Encounter (Signed)
New Message    Pt c/o medication issue:  1. Name of Medication: Warfarin  2. How are you currently taking this medication (dosage and times per day)? 5mg   3. Are you having a reaction (difficulty breathing--STAT)?   4. What is your medication issue? Patient is calling to know when he start taking his warfarin again. Please call.

## 2017-07-17 NOTE — Assessment & Plan Note (Signed)
Gr 2-3 banded all 3 RA not on well RTC 2 mos Back on warfarin Jun 3 Call me in 1 month

## 2017-07-17 NOTE — Progress Notes (Signed)
   Hemorrhoid banding:  Symptoms are bleeding and prolapse these were diagnosed in April, the patient is on warfarin which was held for 3 days after consultation with his cardiologist  Rectal exam shows a grade 3  right posterior internal hemorrhoid complex and some tags, no mass nontender Anoscopy in April demonstrated similar findings with some external components to his hemorrhoid complexes PROCEDURE NOTE: The patient presents with symptomatic grade 2 and 3 hemorrhoids, requesting rubber band ligation of his/her hemorrhoidal disease.  All risks, benefits and alternative forms of therapy were described and informed consent was obtained.   The anorectum was pre-medicated with 0.125% nitroglycerin and 5% lidocaine The decision was made to band the all 3 columns of internal hemorrhoids, and the [place] was used to perform band ligation without complication.  Digital anorectal examination was then performed to assure proper positioning of the band, and to adjust the banded tissue as required.  The patient was discharged home without pain or other issues.  Dietary and behavioral recommendations were given and along with follow-up instructions.     The following adjunctive treatments were recommended:  Continue dicyclomine which is helping his IBS urgent postprandial defecation and he has noted less bleeding since using that as well as using hydrocortisone cream while we waited to set this up  He will resume warfarin on June 3  He is to call with an update in about 1 month as I might need to plan to hold his warfarin again though recent recommendations indicate it is also reasonable to continue anticoagulation and to do the banding The patient will return in 2 months for  follow-up and possible additional banding as required. No complications were encountered and the patient tolerated the procedure well.  I appreciate the opportunity to care for this patient. CC: Biagio Borg,  MD

## 2017-07-17 NOTE — Patient Instructions (Addendum)
HEMORRHOID BANDING PROCEDURE    FOLLOW-UP CARE   1. The procedure you have had should have been relatively painless since the banding of the area involved does not have nerve endings and there is no pain sensation.  The rubber band cuts off the blood supply to the hemorrhoid and the band may fall off as soon as 48 hours after the banding (the band may occasionally be seen in the toilet bowl following a bowel movement). You may notice a temporary feeling of fullness in the rectum which should respond adequately to plain Tylenol or Motrin.  2. Following the banding, avoid strenuous exercise that evening and resume full activity the next day.  A sitz bath (soaking in a warm tub) or bidet is soothing, and can be useful for cleansing the area after bowel movements.     3. To avoid constipation, take two tablespoons of natural wheat bran, natural oat bran, flax, Benefiber or any over the counter fiber supplement and increase your water intake to 7-8 glasses daily.    4. Unless you have been prescribed anorectal medication, do not put anything inside your rectum for two weeks: No suppositories, enemas, fingers, etc.  5. Occasionally, you may have more bleeding than usual after the banding procedure.  This is often from the untreated hemorrhoids rather than the treated one.  Don't be concerned if there is a tablespoon or so of blood.  If there is more blood than this, lie flat with your bottom higher than your head and apply an ice pack to the area. If the bleeding does not stop within a half an hour or if you feel faint, call our office at [phone] or go to the emergency room.  6. Problems are not common; however, if there is a substantial amount of bleeding, severe pain, chills, fever or difficulty passing urine (very rare) or other problems, you should call us at [phone] or report to the nearest emergency room.  7. Do not stay seated continuously for more than 2-3 hours for a day or two  after the procedure.  Tighten your buttock muscles 10-15 times every two hours and take 10-15 deep breaths every 1-2 hours.  Do not spend more than a few minutes on the toilet if you cannot empty your bowel; instead re-visit the toilet at a later time.    START BACK ON YOUR WARFARIN Monday 07/22/17 per Dr Carlean Purl.   CALL us IN A MONTH WITH AN UPDATE.   Please come see [place] in 2 months.   Continue your dicyclomine.   I appreciate the opportunity to care for you. Silvano Rusk, MD, [facility]

## 2017-07-18 NOTE — Telephone Encounter (Signed)
Patient is concerned about how long he needs to hold his Warfarin. Patient stopped his Warfarin on 07/14/17 Sunday for his procedure, Hemorrhoid Banding, with Dr. Carlean Purl. Dr. Carlean Purl advised patient to hold his Warfarin until 07/22/17. Patient wanted to make sure this was okay for him to do. Informed patient that usually the procedure doctor determines when the patient starts back on Warfarin to prevent any complications from procedure. Patient would just like Dr. Johnsie Cancel to be aware and his advisement.  Will send to Dr. Johnsie Cancel to advise.

## 2017-07-19 ENCOUNTER — Telehealth: Payer: Self-pay

## 2017-07-19 NOTE — Telephone Encounter (Signed)
Copied from [place] [id number]. Topic: Inquiry >> Jul 19, 2017  8:20 AM Pricilla Handler wrote: Reason for CRM: Patient's wife stated that Patient wants Dr. Deborra Medina to refer him to a Cataract Surgery. Patient goes to [facility] in [place]. Please call patient's wife at [phone].       Thank You!!!

## 2017-07-19 NOTE — Telephone Encounter (Signed)
I am not his doctor so I cannot place a referral.  I think she may just be asking my opinion for who I would recommend?  There are many good opthomologists in [place].  Who is he seeing specifically/

## 2017-07-24 ENCOUNTER — Telehealth: Payer: Self-pay | Admitting: Internal Medicine

## 2017-07-25 MED ORDER — BENEFIBER PO POWD: ORAL | 0 refills | Status: DC

## 2017-07-25 NOTE — Telephone Encounter (Signed)
He has formed BM then later in day urgent loose/watery stools then no defecation x 2 days - cycle repeats  This was same before dicyclomine  No use of loperamide  Do not think he ever used the Benefiber as instricted in April  I asked he try 1 tbsp Benefiber bid and if no help after few weeks let me know  Goal is to produce more regular defecation and try to avoid unloading episodes

## 2017-07-25 NOTE — Telephone Encounter (Signed)
Patient reports that dicyclomine is not helping at all.  He is still having diarrhea multiple times a day.  Diarrhea is very urgent and can't control. Please advise

## 2017-07-29 ENCOUNTER — Ambulatory Visit (INDEPENDENT_AMBULATORY_CARE_PROVIDER_SITE_OTHER): Payer: Medicare Other | Admitting: Pharmacist

## 2017-07-29 DIAGNOSIS — Z5181 Encounter for therapeutic drug level monitoring: Secondary | ICD-10-CM | POA: Diagnosis not present

## 2017-07-29 DIAGNOSIS — Z951 Presence of aortocoronary bypass graft: Secondary | ICD-10-CM

## 2017-07-29 LAB — POCT INR: INR: 1.4 — AB (ref 2.0–3.0)

## 2017-07-29 NOTE — Patient Instructions (Signed)
Take an extra 1/2 tablet today and tomorrow, then continue on same dosage 1/2 tablet daily except 1 tablet on Monday, Wednesday, and Saturday. We will see you back in one week on June 17th. Coumadin Clinic#[phone] Main [phone].

## 2017-08-05 ENCOUNTER — Encounter (INDEPENDENT_AMBULATORY_CARE_PROVIDER_SITE_OTHER): Payer: Self-pay

## 2017-08-05 ENCOUNTER — Ambulatory Visit (INDEPENDENT_AMBULATORY_CARE_PROVIDER_SITE_OTHER): Payer: Medicare Other | Admitting: Pharmacist

## 2017-08-05 DIAGNOSIS — Z5181 Encounter for therapeutic drug level monitoring: Secondary | ICD-10-CM | POA: Diagnosis not present

## 2017-08-05 DIAGNOSIS — Z951 Presence of aortocoronary bypass graft: Secondary | ICD-10-CM | POA: Diagnosis not present

## 2017-08-05 LAB — POCT INR: INR: 1.9 — AB (ref 2.0–3.0)

## 2017-08-05 NOTE — Patient Instructions (Signed)
Description   Take an extra 1/2 tablet today, then continue on same dosage 1/2 tablet daily except 1 tablet on Monday, Wednesday, and Saturday. We will see you back in 2 weeks. Coumadin Clinic#[phone] Main [phone].

## 2017-08-12 ENCOUNTER — Telehealth: Payer: Self-pay | Admitting: Internal Medicine

## 2017-08-12 DIAGNOSIS — R197 Diarrhea, unspecified: Secondary | ICD-10-CM

## 2017-08-12 NOTE — Telephone Encounter (Signed)
Patient reports diarrhea and rectal bleeding.  He has had 4 episodes of bleeding today.  He is offered an appt with Alonza Bogus, PA on 08/15/17.  He declines. He is encouraged to come in for appt since he reports being dizzy when he bends over.  He is on Warfarin.  Patient again refuses the appt. He has follow up with Dr. Carlean Purl on 09/10/17 for follow up of hemorrhoid banding.  He did agree to go to the ED if he has more rectal bleeding but not an office visit.

## 2017-08-14 ENCOUNTER — Other Ambulatory Visit: Payer: Self-pay | Admitting: Internal Medicine

## 2017-08-14 DIAGNOSIS — R197 Diarrhea, unspecified: Secondary | ICD-10-CM

## 2017-08-14 MED ORDER — DIPHENOXYLATE-ATROPINE 2.5-0.025 MG PO TABS: 1.0000 | ORAL_TABLET | Freq: Four times a day (QID) | ORAL | 0 refills | Status: DC | PRN

## 2017-08-14 NOTE — Telephone Encounter (Signed)
News labs entered and patient notified He will come for labs next week.

## 2017-08-14 NOTE — Addendum Note (Signed)
Addended by: Marlon Pel on: 08/14/2017 04:14 PM   Modules accepted: Orders

## 2017-08-14 NOTE — Telephone Encounter (Signed)
I sent an Rx Lomotil in 1 qid prn  Have him get a digoxin level please  Will call him after that

## 2017-08-15 ENCOUNTER — Telehealth: Payer: Self-pay | Admitting: Internal Medicine

## 2017-08-15 ENCOUNTER — Ambulatory Visit (INDEPENDENT_AMBULATORY_CARE_PROVIDER_SITE_OTHER): Payer: Medicare Other | Admitting: Podiatry

## 2017-08-15 ENCOUNTER — Ambulatory Visit: Payer: Medicare Other | Admitting: Gastroenterology

## 2017-08-15 DIAGNOSIS — R197 Diarrhea, unspecified: Secondary | ICD-10-CM

## 2017-08-15 DIAGNOSIS — M79609 Pain in unspecified limb: Principal | ICD-10-CM

## 2017-08-15 DIAGNOSIS — B351 Tinea unguium: Secondary | ICD-10-CM | POA: Diagnosis not present

## 2017-08-15 DIAGNOSIS — M79676 Pain in unspecified toe(s): Secondary | ICD-10-CM

## 2017-08-15 DIAGNOSIS — D689 Coagulation defect, unspecified: Secondary | ICD-10-CM | POA: Diagnosis not present

## 2017-08-15 NOTE — Telephone Encounter (Signed)
Wife wants him checked for parasites or infectious diarrhea.  Do you want to add on any test?

## 2017-08-15 NOTE — Telephone Encounter (Signed)
GI pathogen panel  

## 2017-08-15 NOTE — Telephone Encounter (Signed)
Patient notified

## 2017-08-15 NOTE — Progress Notes (Signed)
  Subjective:  Patient ID: Jason Day, male    DOB: 31-Jan-1941,  MRN: [id number]  Chief Complaint  Patient presents with  . Nail Problem    61 days nail trim   77 y.o. male returns for the above complaint.   Denies new issues still on chronic Coumadin for A. fib.  Objective:   There were no vitals filed for this visit. General AA&O x3. Normal mood and affect.  Vascular Pedal pulses palpable.  Neurologic Epicritic sensation grossly intact.  Dermatologic No open lesions. Skin normal texture and turgor. Toenails x 10 elongated, thickened, dystrophic.  Orthopedic: Pain to palpation about the toenails.   Assessment & Plan:  Patient was evaluated and treated and all questions answered.  Onychomycosis with coagulation defect. -Nails palliatively debrided as below. -Educated on self-care  Procedure: Nail Debridement Rationale: Patient meets criteria for routine foot care due to coag defect Type of Debridement: manual, sharp debridement. Instrumentation: Nail nipper, rotary burr. Number of Nails: 10     Return in about 2 months (around 10/17/2017) for Routine Foot Care - On anticoagulant.

## 2017-08-19 ENCOUNTER — Ambulatory Visit (INDEPENDENT_AMBULATORY_CARE_PROVIDER_SITE_OTHER): Payer: Medicare Other | Admitting: *Deleted

## 2017-08-19 ENCOUNTER — Telehealth: Payer: Self-pay | Admitting: Internal Medicine

## 2017-08-19 DIAGNOSIS — Z951 Presence of aortocoronary bypass graft: Secondary | ICD-10-CM

## 2017-08-19 DIAGNOSIS — Z5181 Encounter for therapeutic drug level monitoring: Secondary | ICD-10-CM

## 2017-08-19 LAB — POCT INR: INR: 1.9 — AB (ref 2.0–3.0)

## 2017-08-19 MED ORDER — WARFARIN SODIUM 5 MG PO TABS: 5.0000 mg | ORAL_TABLET | ORAL | 1 refills | Status: DC

## 2017-08-19 NOTE — Patient Instructions (Signed)
Description   Take an extra 1/2 tablet today, then start taking 1 tablet daily except 1/2 tablet on Sunday, Tuesday, and Thursday.  Recheck INR in 2 weeks. Coumadin Clinic#[phone] Main [phone].

## 2017-08-19 NOTE — Telephone Encounter (Signed)
Left message for patient to call back  

## 2017-08-20 NOTE — Telephone Encounter (Signed)
Patient notified that he will need to come to our lab for blood work and stool specimen.

## 2017-08-28 ENCOUNTER — Telehealth: Payer: Self-pay | Admitting: Cardiovascular Disease

## 2017-08-28 ENCOUNTER — Ambulatory Visit: Payer: Medicare Other | Admitting: Internal Medicine

## 2017-08-28 ENCOUNTER — Other Ambulatory Visit: Payer: Medicare Other

## 2017-08-28 ENCOUNTER — Ambulatory Visit (INDEPENDENT_AMBULATORY_CARE_PROVIDER_SITE_OTHER): Payer: Medicare Other | Admitting: Internal Medicine

## 2017-08-28 ENCOUNTER — Encounter: Payer: Self-pay | Admitting: Internal Medicine

## 2017-08-28 VITALS — BP 112/68 | HR 61 | Temp 97.6°F | Ht 72.0 in | Wt 190.0 lb

## 2017-08-28 DIAGNOSIS — R4689 Other symptoms and signs involving appearance and behavior: Secondary | ICD-10-CM | POA: Diagnosis not present

## 2017-08-28 DIAGNOSIS — K582 Mixed irritable bowel syndrome: Secondary | ICD-10-CM | POA: Diagnosis not present

## 2017-08-28 DIAGNOSIS — R079 Chest pain, unspecified: Secondary | ICD-10-CM

## 2017-08-28 DIAGNOSIS — K625 Hemorrhage of anus and rectum: Secondary | ICD-10-CM

## 2017-08-28 DIAGNOSIS — A09 Infectious gastroenteritis and colitis, unspecified: Secondary | ICD-10-CM | POA: Diagnosis not present

## 2017-08-28 DIAGNOSIS — I255 Ischemic cardiomyopathy: Secondary | ICD-10-CM

## 2017-08-28 NOTE — Telephone Encounter (Signed)
New Message   Pt is requesting a call back from a nurse but does not want to disclose reason

## 2017-08-28 NOTE — Patient Instructions (Signed)
Please continue all other medications as before, and refills have been done if requested.  Please have the pharmacy call with any other refills you may need.  Please continue your efforts at being more active, low cholesterol diet.  Please keep your appointments with your specialists as you may have planned  Please go to the LAB in the Basement (turn left off the elevator) for the tests to be done today  You will be contacted by phone if any changes need to be made immediately.  Otherwise, you will receive a letter about your results with an explanation, but please check with MyChart first.  Please remember to sign up for MyChart if you have not done so, as this will be important to you in the future with finding out test results, communicating by private email, and scheduling acute appointments online when needed.  Please return in 6 months, or sooner if needed

## 2017-08-28 NOTE — Assessment & Plan Note (Signed)
C/w msk/costochondritis, very low suspicion for cardiac

## 2017-08-28 NOTE — Progress Notes (Signed)
Subjective:    Patient ID: Jason Day, male    DOB: [birth year], 77 y.o.   MRN: [id number]  HPI  Here to f/u, states upfront to start "I've got some seroius problems"  Has significant angry demeanor, demanding behavior and pressured speech, complaining of several staff in several offices mostly related to GI, seems to be frustrated - "nobody tells me anything"  Does state he realizes he needs to not "make an ass" of himself and decided to keep it all to complain today.  Pt apparently believes I can explain all of the behavior of his health care by reviewing his chart, and wants me to do this in detail, besides explaining every current medication onset, purpose, and potential side effect.  He is adamant he has not been told "everything."   Also c/o CP - severe intermittent sharp lower mid sternal  - ? Indigestion he conjectures but worse to bend over, sore to touch, no radiation, and no sob, n/v, palp or dizziness except for Dizzy with bending over and standing back up.  No other orthostatic symptoms.  Wants me to explain in great detail about why he may have dizziness with bending then standing up at least twice, as if he does not believe the first explanation.   Also vision blurred during day only, actually sees better at night, thinks cataracts worse, to see optho soon.  Has long harangue about his GI illness today - Has seen GI with banding for hemorrhoids, stil having some diarrhea intermittent, found out per nurse he wasn't taking some certain med (I think dycylomine) , then later did not work anyway.  Tried dycyclomine and then lomotil that seemed to help some, but then maybe now be constipated .  Recently also with several episodes blood "pouring out" and worse with every wipe and has mult phone pictures of blood in commode.   Last colonoscopy feb 2016 with diverticulosis only.  Pt has buddy in [place] with similar symptoms and had other stool testing and found a parasite, so he cannot believe why GI  has not done this for him as well Past Medical History:  Diagnosis Date  . Atrial fibrillation ([place])   . Bleeding hemorrhoids   . Chronic combined systolic and diastolic heart failure ([place])   . Chronic sinus complaints    overuses afrin  . Coronary atherosclerosis of native coronary artery   . Hyperlipidemia    Past Surgical History:  Procedure Laterality Date  . BACK SURGERY  ~2014   disc repair  . CHOLECYSTECTOMY    . COLONOSCOPY  04/06/2014   Hemorrhoids and diverticulosis performed in [place]  . CORONARY ARTERY BYPASS GRAFT N/A 10/18/2016   Procedure: CORONARY ARTERY BYPASS GRAFTING (CABG) x five , using left internal mammary artery and right leg greater saphenous vein harvested endoscopically;  Surgeon: Gaye Pollack, MD;  Location: [facility];  Service: Open Heart Surgery;  Laterality: N/A;  . FOOT SURGERY    . HEMORRHOID SURGERY    . RIGHT/LEFT HEART CATH AND CORONARY ANGIOGRAPHY N/A 10/16/2016   Procedure: RIGHT/LEFT HEART CATH AND CORONARY ANGIOGRAPHY;  Surgeon: Belva Crome, MD;  Location: [facility];  Service: Cardiovascular;  Laterality: N/A;  . stents in liver    . TEE WITHOUT CARDIOVERSION N/A 10/18/2016   Procedure: TRANSESOPHAGEAL ECHOCARDIOGRAM (TEE);  Surgeon: Gaye Pollack, MD;  Location: [facility];  Service: Open Heart Surgery;  Laterality: N/A;    reports that he has quit smoking. He has never  used smokeless tobacco. He reports that he does not drink alcohol or use drugs. family history includes COPD in his father; Emphysema in his father; Healthy in his brother. No Known Allergies Current Outpatient Medications on File Prior to Visit  Medication Sig Dispense Refill  . amiodarone (PACERONE) 200 MG tablet Take 200 mg by mouth daily.  6  . aspirin EC 81 MG tablet Take 81 mg by mouth daily.    . digoxin (LANOXIN) 0.125 MG tablet Take 0.5 tablets (0.0625 mg total) by mouth daily. 45 tablet 3  . diphenoxylate-atropine (LOMOTIL) 2.5-0.025 MG tablet Take 1 tablet  by mouth 4 (four) times daily as needed for diarrhea or loose stools. 60 tablet 0  . furosemide (LASIX) 20 MG tablet Take 1 tablet (20 mg total) by mouth daily. 90 tablet 3  . lisinopril (PRINIVIL,ZESTRIL) 10 MG tablet Take 1 tablet (10 mg total) by mouth daily. 90 tablet 3  . metoprolol succinate (TOPROL XL) 25 MG 24 hr tablet Take 0.5 tablets (12.5 mg total) by mouth daily. 90 tablet 3  . rosuvastatin (CRESTOR) 10 MG tablet Take 1 tablet (10 mg total) by mouth at bedtime. 90 tablet 3  . warfarin (COUMADIN) 5 MG tablet Take 1 tablet (5 mg total) by mouth as directed. Or as directed. 90 tablet 1  . Wheat Dextrin (BENEFIBER) POWD 1 tablespoon bid  0  . spironolactone (ALDACTONE) 25 MG tablet Take 0.5 tablets (12.5 mg total) by mouth daily. 15 tablet 11   No current facility-administered medications on file prior to visit.    Review of Systems  Constitutional: Negative for other unusual diaphoresis or sweats HENT: Negative for ear discharge or swelling Eyes: Negative for other worsening visual disturbances Respiratory: Negative for stridor or other swelling  Gastrointestinal: Negative for worsening distension or other blood Genitourinary: Negative for retention or other urinary change Musculoskeletal: Negative for other MSK pain or swelling Skin: Negative for color change or other new lesions Neurological: Negative for worsening tremors and other numbness  Psychiatric/Behavioral: Negative for worsening agitation or other fatigue All other system neg per pt    Objective:   Physical Exam BP 112/68   Pulse 61   Temp 97.6 F (36.4 C) (Oral)   Ht 6' (1.829 m)   Wt 190 lb (86.2 kg)   SpO2 95%   BMI 25.77 kg/m  Irritable, angry demeanor, o/w VS noted,  Constitutional: Pt appears in NAD HENT: Head: NCAT.  Right Ear: External ear normal.  Left Ear: External ear normal.  Eyes: . Pupils are equal, round, and reactive to light. Conjunctivae and EOM are normal Nose: without d/c or  deformity Neck: Neck supple. Gross normal ROM Cardiovascular: Normal rate and regular rhythm.   Pulmonary/Chest: Effort normal and breath sounds without rales or wheezing.  Abd:  Soft, ND, + BS, no organomegaly, mild tender epigastrium MSK: + tender xyphoid to palpation which reproduces his pain  Neurological: Pt is alert. At baseline orientation, motor grossly intact Skin: Skin is warm. No rashes, other new lesions, no LE edema Psychiatric: Pt behavior is as described above No other exam findings     Assessment & Plan:

## 2017-08-28 NOTE — Assessment & Plan Note (Signed)
Pt very angry and overly demanding today, and unrealistic in his expectations of his care today, and indeed is not satisfied as he leaves; I can no longer serve as this pt PCP due to these elements, pt to be discharged

## 2017-08-28 NOTE — Assessment & Plan Note (Addendum)
D/w pt likely diagnosis but for some reason is disbelieving, ok for GI panel but without fever or pain seems to be low likelihood  Note:  Total time for pt hx, exam, review of record with pt in the room, determination of diagnoses and plan for further eval and tx is > 40 min, with over 50% spent in coordination and counseling of patient including the differential dx, tx, further evaluation and other management of IBS, chest pain, behavior change

## 2017-08-29 ENCOUNTER — Telehealth: Payer: Self-pay | Admitting: Internal Medicine

## 2017-08-29 NOTE — Telephone Encounter (Signed)
Pt should go see his PCP to discuss, per Dr. Saunders Revel.

## 2017-08-29 NOTE — Telephone Encounter (Signed)
Follow up   Patient returning your call, pls call

## 2017-08-29 NOTE — Telephone Encounter (Signed)
Called patient back. Patient stated he just saw his PCP and he is not doing anything for the patient. Patient stated he is also getting more SOB with activity. Patient stated he wants to know what Dr. Johnsie Cancel thinks. Informed patient that Dr. Johnsie Cancel is out. Patient stated he will wait until Dr. Johnsie Cancel is back, because he trust what Dr. Johnsie Cancel tells him.

## 2017-08-29 NOTE — Telephone Encounter (Signed)
Called patient back. Patient complaining of feeling dizzy and needing to hold on to something for several minutes before he can get his balance after standing up when picking something off the ground. Patient stated he has had vertigo in the past. Patient stated he wanted someone to advise on this and get back to him and hung up the phone. Was unable to let patient know Dr. Johnsie Cancel was out of the office this week. Will send message to DOD, Dr. Saunders Revel for advisement.

## 2017-08-29 NOTE — Telephone Encounter (Signed)
Left message for patient to call back  

## 2017-08-29 NOTE — Telephone Encounter (Signed)
Patient dismissed from [facility] by Cathlean Cower MD, effective August 28, 2017. Dismissal letter sent out by certified / registered mail.  daj

## 2017-09-01 NOTE — Telephone Encounter (Signed)
Would appear reading Dr Gwynn Burly notes that he was rather inappropriate at his office visit and dismissed from the practice He will need to find new primary and possibly need psychiatry evaluation for his behavior can refer to GI for  His bowel issues will need to refer to [facility]

## 2017-09-02 ENCOUNTER — Telehealth: Payer: Self-pay

## 2017-09-02 LAB — GASTROINTESTINAL PATHOGEN PANEL PCR
C. difficile Tox A/B, PCR: NOT DETECTED
CAMPYLOBACTER, PCR: NOT DETECTED
CRYPTOSPORIDIUM, PCR: NOT DETECTED
E COLI (ETEC) LT/ST, PCR: NOT DETECTED
E COLI 0157, PCR: NOT DETECTED
E coli (STEC) stx1/stx2, PCR: NOT DETECTED
GIARDIA LAMBLIA, PCR: NOT DETECTED
Norovirus, PCR: NOT DETECTED
Rotavirus A, PCR: NOT DETECTED
SHIGELLA, PCR: NOT DETECTED
Salmonella, PCR: NOT DETECTED

## 2017-09-02 NOTE — Telephone Encounter (Signed)
Left message for patient to call back  

## 2017-09-02 NOTE — Telephone Encounter (Signed)
Patient called back. Order GI referral to doctor at [facility]. Informed patient that someone will call him with an appointment from that office. Patient verbalized understanding.

## 2017-09-02 NOTE — Telephone Encounter (Signed)
Patient was transferred from medical records in regards to his lab results.   He was loud and irrate stating that he didn't receive a phone call in regards to his labs. I explained to him that the type of test that he had some was sent out to "[facility]" and normally takes a few days to get a result back since that is a test we could not perform in our lab downstairs. During the time I was explaining this to the patient he over talked me stating that the test was done over a week ago and wanted an answer. I stated that I could not give him an answer right now until we receive that results and once it has been received I will give him a phone call. He then proceed to complain about how "lousy" our phone system is and repeated "its just lousy". He stated he wanted "a direct phone number to my doctor" I apologized and stated that PCP doesn't have a direct number and I could relay an msg for him and provided the main office number as he expressed the long hold times and states "everytime I'm in the office the women up front aren't doing anything so why cant they answer the phone". I explained we have a call center now that answers phone calls and he was not pleased with that answer. I somewhat got the phone call back to the task at hand and told him that I would call him once result is received and he expressed understanding them we disconnected.

## 2017-09-03 ENCOUNTER — Telehealth: Payer: Self-pay

## 2017-09-03 ENCOUNTER — Encounter: Payer: Self-pay | Admitting: Internal Medicine

## 2017-09-03 NOTE — Telephone Encounter (Signed)
Called patient, per his request, regarding dismissal letter he received 09/02/17. Confirmed with patient that dismissal is for all of [facility] due his anger & unrealistic demands of both our provider and staff. Offered to provide references for Primary Care in his area, patient declined.

## 2017-09-05 ENCOUNTER — Ambulatory Visit (INDEPENDENT_AMBULATORY_CARE_PROVIDER_SITE_OTHER): Payer: Medicare Other | Admitting: Pharmacist

## 2017-09-05 DIAGNOSIS — Z5181 Encounter for therapeutic drug level monitoring: Secondary | ICD-10-CM | POA: Diagnosis not present

## 2017-09-05 DIAGNOSIS — Z951 Presence of aortocoronary bypass graft: Secondary | ICD-10-CM

## 2017-09-05 LAB — POCT INR: INR: 3.2 — AB (ref 2.0–3.0)

## 2017-09-05 NOTE — Telephone Encounter (Signed)
Received signed domestic return receipt verifying delivery of certified letter on September 02, 2017. Article number [id number]

## 2017-09-05 NOTE — Patient Instructions (Signed)
Description   Skip your Coumadin today, then continue taking 1 tablet daily except 1/2 tablet on Sunday, Tuesday, and Thursday.  Recheck INR in 2 weeks. Coumadin Clinic#[phone] Main [phone].

## 2017-09-10 ENCOUNTER — Telehealth: Payer: Self-pay

## 2017-09-10 ENCOUNTER — Ambulatory Visit (INDEPENDENT_AMBULATORY_CARE_PROVIDER_SITE_OTHER): Payer: Medicare Other | Admitting: Internal Medicine

## 2017-09-10 ENCOUNTER — Encounter: Payer: Self-pay | Admitting: Internal Medicine

## 2017-09-10 VITALS — BP 104/50 | HR 72 | Ht 71.25 in | Wt 191.2 lb

## 2017-09-10 DIAGNOSIS — Z7901 Long term (current) use of anticoagulants: Secondary | ICD-10-CM

## 2017-09-10 DIAGNOSIS — K644 Residual hemorrhoidal skin tags: Secondary | ICD-10-CM | POA: Diagnosis not present

## 2017-09-10 DIAGNOSIS — K648 Other hemorrhoids: Secondary | ICD-10-CM | POA: Diagnosis not present

## 2017-09-10 DIAGNOSIS — K529 Noninfective gastroenteritis and colitis, unspecified: Secondary | ICD-10-CM

## 2017-09-10 DIAGNOSIS — I482 Chronic atrial fibrillation, unspecified: Secondary | ICD-10-CM

## 2017-09-10 NOTE — Telephone Encounter (Signed)
  [facility] Pre-operative Risk Assessment     Request for surgical clearance:     Endoscopy Procedure  What type of surgery is being performed?     EGD  When is this surgery scheduled?     To be set up, hopefully 09/23/17 at [facility]  What type of clearance is required ?   Pharmacy  Are there any medications that need to be held prior to surgery and how long? Warfarin, 5 days  Practice name and name of physician performing surgery?      [facility]  What is your office phone and fax number?      Phone- [phone]  [phone]  Anesthesia type (None, local, MAC, general) ?       MAC

## 2017-09-10 NOTE — Patient Instructions (Addendum)
  It has been recommended to you by your physician that you have a(n) colonscopy completed at [facility]. Per your request, we did not schedule the procedure(s) today. Please contact our office at [phone] should you decide to have the procedure completed.   You will be contaced by our office prior to your procedure for directions on holding your Coumadin/Warfarin.  If you do not hear from our office 1 week prior to your scheduled procedure, please call [phone] to discuss.   I appreciate the opportunity to care for you. Silvano Rusk, MD, [facility]

## 2017-09-10 NOTE — Telephone Encounter (Signed)
Will send to [facility] for recommendations. Richardson Dopp, PA-C    09/10/2017 5:02 PM

## 2017-09-10 NOTE — Progress Notes (Signed)
Jason Day 77 y.o. 1940/07/08 [id number]  Assessment & Plan:   Encounter Diagnoses  Name Primary?  . Chronic diarrhea Yes  . Internal and external bleeding hemorrhoids   . On warfarin therapy   . Chronic atrial fibrillation (HCC)     I think he needs a colonoscopy and would do that at the hospital given his comorbidities.  He will check to see if he can do it on August 5.  Would hold his warfarin 5 days 3 days would be okay as well, will check with Dr. Johnsie Cancel.  Increased but rare risk of stroke off warfarin explained to the patient.  Other routine typical risks of bleeding perforation possible need for surgery and missed lesions and inability to predict all risks of colonoscopy with sedation also explained.  He will continue his current therapy since it is helping his diarrhea.  It sounds like his dizziness problems proceeded the use of the Lomotil and the dicyclomine.  Though to be honest I am not 100% sure and we will contact him and tell him to stop taking both.  He may need repeat work-up of his dizziness question through cardiology or neurology ENT not sure.  Will discuss.  Question if amiodarone could be causing some problems.  Appetite is good he is not nauseous.  TSH was normal in the spring.  Can consider banding hemorrhoids again he is improved but I need to understand this diarrhea better.  Maybe he has ulcerative colitis?  Cancer seems unlikely but is in the differential also  Copy to Dr. Jenkins Rouge  Subjective:   Chief Complaint: Rectal bleeding and hemorrhoids  HPI The patient is here for follow-up after I banded hemorrhoids in May.  All 3 columns were banded the knees had less bleeding but he still has it.  I had thought he had irritable bowel syndrome based upon the history I gotten he had a negative colonoscopy other than hemorrhoids and diverticulosis, in [place] in 2016.  Now I am not so sure he has had persistent diarrhea but he is been very frustrated he got  discharged from [facility] primary care after an interaction with Dr. Jenny Reichmann where he thought that he should be able to explain all of his health problems and felt like he was not heard and apparently got frustrated it was inappropriate he has been just prescribed dicyclomine and then Lomotil and he is now using both and thinks that is helping his diarrhea.  Shows me photos from his cell phone of bright red blood in the toilet still.  He is encouraged by the improved stool consistency and less frequent diarrhea on the medication.  He had a negative GI pathogen panel.  He is afraid and scared by the bleeding and the diarrhea.  Wt Readings from Last 3 Encounters:  09/10/17 191 lb 4 oz (86.8 kg)  08/28/17 190 lb (86.2 kg)  07/17/17 193 lb 6 oz (87.7 kg)   He says he is having problems with dizziness, it could be vertigo he had a big work-up for vertigo in [place] apparently but she does not think they found any problems, he also has tinnitus.  He also has symptoms that sound like they might be orthostasis. No Known Allergies Current Meds  Medication Sig  . amiodarone (PACERONE) 200 MG tablet Take 200 mg by mouth daily.  Marland Kitchen aspirin EC 81 MG tablet Take 81 mg by mouth daily.  . digoxin (LANOXIN) 0.125 MG tablet Take 0.5 tablets (0.0625 mg total) by  mouth daily.  . diphenoxylate-atropine (LOMOTIL) 2.5-0.025 MG tablet Take 1 tablet by mouth 4 (four) times daily as needed for diarrhea or loose stools.  . furosemide (LASIX) 20 MG tablet Take 1 tablet (20 mg total) by mouth daily.  Marland Kitchen lisinopril (PRINIVIL,ZESTRIL) 10 MG tablet Take 1 tablet (10 mg total) by mouth daily.  . metoprolol succinate (TOPROL XL) 25 MG 24 hr tablet Take 0.5 tablets (12.5 mg total) by mouth daily.  . rosuvastatin (CRESTOR) 10 MG tablet Take 1 tablet (10 mg total) by mouth at bedtime.  Marland Kitchen spironolactone (ALDACTONE) 25 MG tablet Take 0.5 tablets (12.5 mg total) by mouth daily.  Marland Kitchen warfarin (COUMADIN) 5 MG tablet Take 1 tablet (5 mg total) by  mouth as directed. Or as directed.   Past Medical History:  Diagnosis Date  . Atrial fibrillation ([place])   . Bleeding hemorrhoids   . Chronic combined systolic and diastolic heart failure ([place])   . Chronic sinus complaints    overuses afrin  . Coronary atherosclerosis of native coronary artery   . Hyperlipidemia    Past Surgical History:  Procedure Laterality Date  . BACK SURGERY  ~2014   disc repair  . CHOLECYSTECTOMY    . COLONOSCOPY  04/06/2014   Hemorrhoids and diverticulosis performed in [place]  . CORONARY ARTERY BYPASS GRAFT N/A 10/18/2016   Procedure: CORONARY ARTERY BYPASS GRAFTING (CABG) x five , using left internal mammary artery and right leg greater saphenous vein harvested endoscopically;  Surgeon: Gaye Pollack, MD;  Location: [facility];  Service: Open Heart Surgery;  Laterality: N/A;  . FOOT SURGERY    . HEMORRHOID BANDING    . HEMORRHOID SURGERY    . RIGHT/LEFT HEART CATH AND CORONARY ANGIOGRAPHY N/A 10/16/2016   Procedure: RIGHT/LEFT HEART CATH AND CORONARY ANGIOGRAPHY;  Surgeon: Belva Crome, MD;  Location: [facility];  Service: Cardiovascular;  Laterality: N/A;  . stents in liver    . TEE WITHOUT CARDIOVERSION N/A 10/18/2016   Procedure: TRANSESOPHAGEAL ECHOCARDIOGRAM (TEE);  Surgeon: Gaye Pollack, MD;  Location: [facility];  Service: Open Heart Surgery;  Laterality: N/A;   Social History   Social History Narrative   Married    Originally from [place] moved to [place] age 1 moved to [place]   1 son   1 estranged daughter      Has living will   Wife is health care POA--then son   Would accept resuscitation   No tube feeds if cognitively unaware   family history includes COPD in his father; Emphysema in his father; Healthy in his brother.   Review of Systems See HPI  Objective:   Physical Exam  BP (!) 104/50 (BP Location: Left Arm, Patient Position: Sitting, Cuff Size: Normal)   Pulse 72   Ht 5' 11.25" (1.81 m) Comment: height  measured without shoes  Wt 191 lb 4 oz (86.8 kg)   BMI 26.49 kg/m  Eyes are anicteric He is anxious The lungs are clear Heart S1-S2 with MR murmur Abdomen is soft nontender benign there is a diastases recti Rectal is deferred Short-term memory appears grossly intact to me throughout the exam interview, he appears alert and oriented x3 he has good recall of recent events

## 2017-09-11 NOTE — Telephone Encounter (Signed)
Left patient detailed message to call me back ASAP.

## 2017-09-11 NOTE — Telephone Encounter (Addendum)
Pt takes warfarin for afib with CHADS2VASc score of 4 (age x2, CHF, CAD). Ok to hold warfarin for 5 days prior to procedure.  Spoke with pt to move INR check since it's currently scheduled for procedure date. He states he doesn't know if he wants the procedure and wanted our opinion on whether or not he needed it. Advised that we cannot provide recommendations on whether or not he needs a colonoscopy but that this was discussed when he saw his GI doctor yesterday. Advised him to call us back if he does have colonoscopy as we will need to move his next INR check back a week.

## 2017-09-12 NOTE — Telephone Encounter (Signed)
   Primary Cardiologist: Jenkins Rouge, MD  Chart reviewed as part of pre-operative protocol coverage.   Request was sent regarding coumadin. According to our pharmacy division: Pt takes warfarin for afib with CHADS2VASc score of 4 (age x2, CHF, CAD). Ok to hold warfarin for 5 days prior to procedure.  I will route this recommendation to the requesting party via Epic fax function and remove from pre-op pool.  Please call with questions.  Tami Lin Duke, PA 09/12/2017, 3:19 PM

## 2017-09-13 NOTE — Telephone Encounter (Signed)
ok 

## 2017-09-13 NOTE — Telephone Encounter (Signed)
Left patient voicemail to call me back

## 2017-09-13 NOTE — Telephone Encounter (Signed)
Patient returning phone call to PJ.

## 2017-09-13 NOTE — Telephone Encounter (Signed)
Jason Day called back and he is unable to do the 09/23/2017 date for his procedure. He will call me back Mid-August to see about setting up a date. I told him we got clearance for him to hold his blood thinner.

## 2017-09-19 ENCOUNTER — Encounter: Payer: Self-pay | Admitting: Internal Medicine

## 2017-09-19 DIAGNOSIS — A09 Infectious gastroenteritis and colitis, unspecified: Secondary | ICD-10-CM | POA: Insufficient documentation

## 2017-09-23 ENCOUNTER — Ambulatory Visit (INDEPENDENT_AMBULATORY_CARE_PROVIDER_SITE_OTHER): Payer: Medicare Other | Admitting: *Deleted

## 2017-09-23 DIAGNOSIS — Z5181 Encounter for therapeutic drug level monitoring: Secondary | ICD-10-CM | POA: Diagnosis not present

## 2017-09-23 DIAGNOSIS — Z951 Presence of aortocoronary bypass graft: Secondary | ICD-10-CM | POA: Diagnosis not present

## 2017-09-23 LAB — POCT INR: INR: 2.2 (ref 2.0–3.0)

## 2017-09-23 NOTE — Patient Instructions (Signed)
Description   Continue taking 1 tablet daily except 1/2 tablet on Sunday, Tuesday, and Thursday.  Recheck INR in 3 weeks. Coumadin Clinic#[phone] Main [phone].

## 2017-10-01 ENCOUNTER — Other Ambulatory Visit: Payer: Self-pay | Admitting: Cardiovascular Disease

## 2017-10-01 ENCOUNTER — Other Ambulatory Visit: Payer: Self-pay | Admitting: Internal Medicine

## 2017-10-01 NOTE — Telephone Encounter (Signed)
===  View-only below this line===  ----- Message ----- From: Gatha Mayer, MD Sent: 09/13/2017   1:04 PM EDT To: Patti E Martinique, CMA Subject: FW: FYI                                        See if Mr. Pyon will set up the colonoscopy  Aug 5 at hospital ----- Message ----- From: Josue Hector, MD Sent: 09/13/2017   8:03 AM To: Gatha Mayer, MD Subject: RE: Hermenia Bers to hold coumadin 5 days before colonoscopy

## 2017-10-13 DIAGNOSIS — Z23 Encounter for immunization: Secondary | ICD-10-CM | POA: Diagnosis not present

## 2017-10-14 ENCOUNTER — Telehealth: Payer: Self-pay | Admitting: Pharmacist

## 2017-10-14 ENCOUNTER — Ambulatory Visit (INDEPENDENT_AMBULATORY_CARE_PROVIDER_SITE_OTHER): Payer: Medicare Other | Admitting: Pharmacist

## 2017-10-14 DIAGNOSIS — Z5181 Encounter for therapeutic drug level monitoring: Secondary | ICD-10-CM

## 2017-10-14 DIAGNOSIS — Z951 Presence of aortocoronary bypass graft: Secondary | ICD-10-CM

## 2017-10-14 LAB — POCT INR: INR: 1.6 — AB (ref 2.0–3.0)

## 2017-10-14 MED ORDER — ATORVASTATIN CALCIUM 20 MG PO TABS: 20.0000 mg | ORAL_TABLET | Freq: Every day | ORAL | 3 refills | Status: DC

## 2017-10-14 NOTE — Telephone Encounter (Signed)
Pt came for INR check today and mentioned that his rosuvastatin costs $150 for a 3 month supply and he would like to switch to a cheaper alternative. Will route to Dr Johnsie Cancel to see if he is ok with switching pt to equivalent dose of atorvastatin 20mg  daily.

## 2017-10-14 NOTE — Telephone Encounter (Signed)
Pt aware.

## 2017-10-14 NOTE — Telephone Encounter (Signed)
Since I've not heard back from Mr Gossman I am mailing him a letter to call us so we can set up his colonoscopy for his diarrhea at [facility]. We got clearance to hold his warfarin for 5 days from Dr Johnsie Cancel.

## 2017-10-14 NOTE — Patient Instructions (Addendum)
Description   Take 1.5 tablets today and 1 tablet tomorrow, then continue taking 1 tablet daily except 1/2 tablet on Sunday, Tuesday, and Thursday.  Recheck INR in 2 weeks. Coumadin Clinic#[phone] Main [phone].

## 2017-10-14 NOTE — Telephone Encounter (Signed)
Rx sent in for atorvastatin 20mg  daily, LMOM for pt.

## 2017-10-14 NOTE — Telephone Encounter (Signed)
Ok to change

## 2017-10-24 ENCOUNTER — Other Ambulatory Visit: Payer: Self-pay | Admitting: Podiatry

## 2017-10-24 ENCOUNTER — Encounter: Payer: Self-pay | Admitting: Podiatry

## 2017-10-24 ENCOUNTER — Ambulatory Visit (INDEPENDENT_AMBULATORY_CARE_PROVIDER_SITE_OTHER): Payer: Medicare Other

## 2017-10-24 ENCOUNTER — Ambulatory Visit (INDEPENDENT_AMBULATORY_CARE_PROVIDER_SITE_OTHER): Payer: Medicare Other | Admitting: Podiatry

## 2017-10-24 DIAGNOSIS — M792 Neuralgia and neuritis, unspecified: Secondary | ICD-10-CM

## 2017-10-24 DIAGNOSIS — M79672 Pain in left foot: Secondary | ICD-10-CM

## 2017-10-24 DIAGNOSIS — M79671 Pain in right foot: Secondary | ICD-10-CM | POA: Diagnosis not present

## 2017-10-24 DIAGNOSIS — B351 Tinea unguium: Secondary | ICD-10-CM | POA: Diagnosis not present

## 2017-10-24 DIAGNOSIS — D689 Coagulation defect, unspecified: Secondary | ICD-10-CM | POA: Diagnosis not present

## 2017-10-24 DIAGNOSIS — M7750 Other enthesopathy of unspecified foot: Secondary | ICD-10-CM | POA: Diagnosis not present

## 2017-10-24 DIAGNOSIS — M779 Enthesopathy, unspecified: Secondary | ICD-10-CM

## 2017-10-24 NOTE — Progress Notes (Signed)
Subjective:  Patient ID: Jason Day, male    DOB: 17-Sep-1940,  MRN: [id number]  Chief Complaint  Patient presents with  . Nail Problem    right great toe; pt stated, "think its an ingrown"  . Foot Pain    bilateral ball of foot; pt stated, "feet feel tight all the time, hard to move my toes"    77 y.o. male presents with the above complaint. States the right great toenail feels ingrown.  Also complains of numbness in the balls of both feet. States they feel tight all the time. Endorses history of low back issues.  Review of Systems: Negative except as noted in the HPI. Denies N/V/F/Ch.  Past Medical History:  Diagnosis Date  . Atrial fibrillation ([place])   . Bleeding hemorrhoids   . Chronic combined systolic and diastolic heart failure ([place])   . Chronic sinus complaints    overuses afrin  . Coronary atherosclerosis of native coronary artery   . Hyperlipidemia     Current Outpatient Medications:  .  amiodarone (PACERONE) 200 MG tablet, Take 200 mg by mouth daily., Disp: , Rfl: 6 .  aspirin EC 81 MG tablet, Take 81 mg by mouth daily., Disp: , Rfl:  .  atorvastatin (LIPITOR) 20 MG tablet, Take 1 tablet (20 mg total) by mouth daily., Disp: 90 tablet, Rfl: 3 .  digoxin (LANOXIN) 0.125 MG tablet, TAKE 1/2 TABLET BY MOUTH DAILY, Disp: 45 tablet, Rfl: 2 .  diphenoxylate-atropine (LOMOTIL) 2.5-0.025 MG tablet, Take 1 tablet by mouth 4 (four) times daily as needed for diarrhea or loose stools., Disp: 60 tablet, Rfl: 0 .  furosemide (LASIX) 20 MG tablet, Take 1 tablet (20 mg total) by mouth daily., Disp: 90 tablet, Rfl: 3 .  lisinopril (PRINIVIL,ZESTRIL) 10 MG tablet, Take 1 tablet (10 mg total) by mouth daily., Disp: 90 tablet, Rfl: 3 .  metoprolol succinate (TOPROL XL) 25 MG 24 hr tablet, Take 0.5 tablets (12.5 mg total) by mouth daily., Disp: 90 tablet, Rfl: 3 .  NON FORMULARY, [facility]  Peripheral Neuropathy Cream- Bupivacaine 1%, Doxepin 3%, Gabapentin 6%, Pentoxifylline 3%,  Topiramate 1% Apply 1-2 grams to affected area 3-4 times daily Qty. 120 gm 3 refills, Disp: , Rfl:  .  warfarin (COUMADIN) 5 MG tablet, Take 1 tablet (5 mg total) by mouth as directed. Or as directed., Disp: 90 tablet, Rfl: 1 .  spironolactone (ALDACTONE) 25 MG tablet, Take 0.5 tablets (12.5 mg total) by mouth daily., Disp: 15 tablet, Rfl: 11  Social History   Tobacco Use  Smoking Status Former Smoker  Smokeless Tobacco Never Used  Tobacco Comment   Smoked on and off    No Known Allergies Objective:  There were no vitals filed for this visit. There is no height or weight on file to calculate BMI. Constitutional Well developed. Well nourished.  Vascular Dorsalis pedis pulses palpable bilaterally. Posterior tibial pulses palpable bilaterally. Capillary refill normal to all digits.  No cyanosis or clubbing noted. Pedal hair growth normal.  Neurologic Normal speech. Oriented to person, place, and time. Epicritic sensation to light touch grossly present bilaterally.  Dermatologic Nails elongated, thickened, slightly ingrowing. No open wounds. No skin lesions.  Orthopedic: Normal joint ROM without pain or crepitus bilaterally. No visible deformities. No bony tenderness.   Radiographs: None Today Assessment:   1. Neuralgia and neuritis   2. Onychomycosis   3. Coagulation defect ([place])   4. Bilateral foot pain    Plan:  Patient was evaluated and treated  and all questions answered.  Onychomycosis with coagulation defect -Nails debrided x10  Procedure: Nail Debridement Rationale: Patient meets criteria for routine foot care due to coag defect. Type of Debridement: manual, sharp debridement. Instrumentation: Nail nipper, rotary burr. Number of Nails: 10  Neuralgia  -Educated on causes of neuropathy. -Most likely due to low back issues. -Rx compound neuropathic cream  Return in about 2 months (around 12/24/2017) for Routine Foot Care - on Anticoagulant.

## 2017-10-28 ENCOUNTER — Ambulatory Visit (INDEPENDENT_AMBULATORY_CARE_PROVIDER_SITE_OTHER): Payer: Medicare Other | Admitting: *Deleted

## 2017-10-28 DIAGNOSIS — Z5181 Encounter for therapeutic drug level monitoring: Secondary | ICD-10-CM

## 2017-10-28 DIAGNOSIS — Z951 Presence of aortocoronary bypass graft: Secondary | ICD-10-CM

## 2017-10-28 LAB — POCT INR: INR: 1.7 — AB (ref 2.0–3.0)

## 2017-10-28 NOTE — Patient Instructions (Signed)
Description   Take 1.5 tablets today, then change dose to 1 tablet daily except 1/2 tablet on Sundays and Tuesdays.  Recheck INR in 2 weeks. [facility]#[phone] Main [phone].

## 2017-11-07 DIAGNOSIS — K649 Unspecified hemorrhoids: Secondary | ICD-10-CM | POA: Diagnosis not present

## 2017-11-07 DIAGNOSIS — I25810 Atherosclerosis of coronary artery bypass graft(s) without angina pectoris: Secondary | ICD-10-CM | POA: Diagnosis not present

## 2017-11-07 DIAGNOSIS — Z7901 Long term (current) use of anticoagulants: Secondary | ICD-10-CM | POA: Diagnosis not present

## 2017-11-07 DIAGNOSIS — I4891 Unspecified atrial fibrillation: Secondary | ICD-10-CM | POA: Diagnosis not present

## 2017-11-11 ENCOUNTER — Ambulatory Visit (INDEPENDENT_AMBULATORY_CARE_PROVIDER_SITE_OTHER): Payer: Medicare Other | Admitting: Pharmacist

## 2017-11-11 DIAGNOSIS — Z5181 Encounter for therapeutic drug level monitoring: Secondary | ICD-10-CM

## 2017-11-11 DIAGNOSIS — Z951 Presence of aortocoronary bypass graft: Secondary | ICD-10-CM | POA: Diagnosis not present

## 2017-11-11 LAB — POCT INR: INR: 2 (ref 2.0–3.0)

## 2017-11-11 NOTE — Patient Instructions (Signed)
Description   Continue taking 1 tablet daily except 1/2 tablet on Sundays and Tuesdays.  Recheck INR in 3 weeks. [facility]#[phone] Main [phone].

## 2017-11-20 ENCOUNTER — Telehealth: Payer: Self-pay | Admitting: Cardiovascular Disease

## 2017-11-20 NOTE — Telephone Encounter (Signed)
Patient would like to speak to you in regards to the open heart surgery and medications. Patient declined to give any other details.

## 2017-11-20 NOTE — Telephone Encounter (Signed)
Left message for patient to call back  

## 2017-11-26 NOTE — Telephone Encounter (Signed)
Well await patient to return call.

## 2017-12-02 ENCOUNTER — Ambulatory Visit (INDEPENDENT_AMBULATORY_CARE_PROVIDER_SITE_OTHER): Payer: Medicare Other | Admitting: Pharmacist

## 2017-12-02 DIAGNOSIS — Z951 Presence of aortocoronary bypass graft: Secondary | ICD-10-CM

## 2017-12-02 DIAGNOSIS — Z5181 Encounter for therapeutic drug level monitoring: Secondary | ICD-10-CM

## 2017-12-02 LAB — POCT INR: INR: 1.5 — AB (ref 2.0–3.0)

## 2017-12-02 NOTE — Patient Instructions (Signed)
Description   Take 1.5 tablets today and then start taking 1 tablet daily except 1/2 tablet on Sundays. Recheck INR in 2 weeks. [facility]#[phone] Main [phone].

## 2017-12-16 ENCOUNTER — Ambulatory Visit (INDEPENDENT_AMBULATORY_CARE_PROVIDER_SITE_OTHER): Payer: Medicare Other

## 2017-12-16 DIAGNOSIS — Z951 Presence of aortocoronary bypass graft: Secondary | ICD-10-CM

## 2017-12-16 DIAGNOSIS — Z5181 Encounter for therapeutic drug level monitoring: Secondary | ICD-10-CM

## 2017-12-16 LAB — POCT INR: INR: 1.8 — AB (ref 2.0–3.0)

## 2017-12-16 NOTE — Patient Instructions (Signed)
Description   Take 1.5 tablets today and then start taking 1 tablet daily.  Recheck INR in 2 weeks. [facility]#[phone] Main [phone].

## 2017-12-30 ENCOUNTER — Ambulatory Visit (INDEPENDENT_AMBULATORY_CARE_PROVIDER_SITE_OTHER): Payer: Medicare Other

## 2017-12-30 DIAGNOSIS — Z951 Presence of aortocoronary bypass graft: Secondary | ICD-10-CM | POA: Diagnosis not present

## 2017-12-30 DIAGNOSIS — Z5181 Encounter for therapeutic drug level monitoring: Secondary | ICD-10-CM | POA: Diagnosis not present

## 2017-12-30 LAB — POCT INR: INR: 2.6 (ref 2.0–3.0)

## 2017-12-30 NOTE — Patient Instructions (Signed)
Description   Continue taking 1/2 tablet on Sundays and then 1 tablet all other days.  Recheck INR in 3 weeks. [facility]#[phone] Main [phone].

## 2018-01-02 ENCOUNTER — Ambulatory Visit: Payer: Medicare Other | Admitting: Podiatry

## 2018-01-08 ENCOUNTER — Other Ambulatory Visit: Payer: Self-pay | Admitting: Cardiovascular Disease

## 2018-01-08 NOTE — Telephone Encounter (Signed)
Outpatient Medication Detail    Disp Refills Start End   metoprolol succinate (TOPROL XL) 25 MG 24 hr tablet 90 tablet 3 05/06/2017    Sig - Route: Take 0.5 tablets (12.5 mg total) by mouth daily. - Oral   Sent to pharmacy as: metoprolol succinate (TOPROL XL) 25 MG 24 hr tablet   E-Prescribing Status: Receipt confirmed by pharmacy (05/06/2017 8:44 AM EDT)   Pharmacy   [id number] - [place], [place] - [address]

## 2018-01-20 ENCOUNTER — Ambulatory Visit (INDEPENDENT_AMBULATORY_CARE_PROVIDER_SITE_OTHER): Payer: Medicare Other

## 2018-01-20 ENCOUNTER — Telehealth: Payer: Self-pay

## 2018-01-20 DIAGNOSIS — Z951 Presence of aortocoronary bypass graft: Secondary | ICD-10-CM

## 2018-01-20 DIAGNOSIS — Z5181 Encounter for therapeutic drug level monitoring: Secondary | ICD-10-CM | POA: Diagnosis not present

## 2018-01-20 LAB — POCT INR: INR: 2.3 (ref 2.0–3.0)

## 2018-01-20 NOTE — Patient Instructions (Signed)
Description   Continue taking 1 tablet daily except 1/2 tablet on Sundays.  Recheck INR in 5 weeks. [facility]#[phone] Main [phone].

## 2018-01-20 NOTE — Telephone Encounter (Signed)
Patient complaining of limited ROM in left arm. Will call patient later to discuss.

## 2018-01-21 NOTE — Telephone Encounter (Signed)
Left message for patient to call back  

## 2018-02-03 ENCOUNTER — Other Ambulatory Visit: Payer: Self-pay | Admitting: Cardiovascular Disease

## 2018-02-03 MED ORDER — LISINOPRIL 10 MG PO TABS: 10.0000 mg | ORAL_TABLET | Freq: Every day | ORAL | 1 refills | Status: DC

## 2018-02-05 ENCOUNTER — Telehealth: Payer: Self-pay | Admitting: Cardiovascular Disease

## 2018-02-05 NOTE — Telephone Encounter (Signed)
Called patient back. Patient complained of left arm pain and numbness from elbow down that comes and goes for the past 3 to 4 months. Informed patient he needs to follow up with his PCP. Patient stated he was dismissed from his PCP's office. Informed patient that he needs to find a PCP or go to urgent care to get himself checked out. Informed patient that I would make Dr. Johnsie Cancel aware of his issue, but he needs to have a PCP. Patient verbalized understanding.

## 2018-02-05 NOTE — Telephone Encounter (Signed)
New Message:    Patient didn't say he wanted just would like for you to call him when you able.

## 2018-02-09 ENCOUNTER — Other Ambulatory Visit: Payer: Self-pay | Admitting: Cardiovascular Disease

## 2018-02-10 NOTE — Telephone Encounter (Signed)
See phone note from 02/10/18

## 2018-02-24 ENCOUNTER — Ambulatory Visit (INDEPENDENT_AMBULATORY_CARE_PROVIDER_SITE_OTHER): Payer: Medicare Other | Admitting: *Deleted

## 2018-02-24 DIAGNOSIS — Z951 Presence of aortocoronary bypass graft: Secondary | ICD-10-CM | POA: Diagnosis not present

## 2018-02-24 DIAGNOSIS — Z5181 Encounter for therapeutic drug level monitoring: Secondary | ICD-10-CM

## 2018-02-24 LAB — POCT INR: INR: 1.8 — AB (ref 2.0–3.0)

## 2018-02-24 NOTE — Patient Instructions (Signed)
Description   Today take 1.5 tablets then continue taking 1 tablet daily except 1/2 tablet on Sundays.  Recheck INR in 4 weeks. [facility]#[phone] Main [phone].

## 2018-03-06 ENCOUNTER — Ambulatory Visit: Payer: Medicare Other | Admitting: Internal Medicine

## 2018-03-14 ENCOUNTER — Other Ambulatory Visit: Payer: Self-pay | Admitting: Cardiovascular Disease

## 2018-03-17 ENCOUNTER — Other Ambulatory Visit: Payer: Self-pay | Admitting: Cardiovascular Disease

## 2018-03-24 ENCOUNTER — Ambulatory Visit (INDEPENDENT_AMBULATORY_CARE_PROVIDER_SITE_OTHER): Payer: Medicare Other

## 2018-03-24 DIAGNOSIS — Z5181 Encounter for therapeutic drug level monitoring: Secondary | ICD-10-CM | POA: Diagnosis not present

## 2018-03-24 DIAGNOSIS — Z951 Presence of aortocoronary bypass graft: Secondary | ICD-10-CM | POA: Diagnosis not present

## 2018-03-24 LAB — POCT INR: INR: 2.3 (ref 2.0–3.0)

## 2018-03-24 NOTE — Patient Instructions (Signed)
Description   Continue on same dosage 1 tablet daily except 1/2 tablet on Sundays.  Recheck INR in 4 weeks.Pt refuses to return in 4 weeks, will return in 6 weeks aware of risks. [facility]#[phone] Main [phone].

## 2018-04-03 DIAGNOSIS — R143 Flatulence: Secondary | ICD-10-CM | POA: Diagnosis not present

## 2018-04-03 DIAGNOSIS — K649 Unspecified hemorrhoids: Secondary | ICD-10-CM | POA: Diagnosis not present

## 2018-04-03 DIAGNOSIS — I4891 Unspecified atrial fibrillation: Secondary | ICD-10-CM | POA: Diagnosis not present

## 2018-04-03 DIAGNOSIS — Z7901 Long term (current) use of anticoagulants: Secondary | ICD-10-CM | POA: Diagnosis not present

## 2018-04-03 DIAGNOSIS — I25810 Atherosclerosis of coronary artery bypass graft(s) without angina pectoris: Secondary | ICD-10-CM | POA: Diagnosis not present

## 2018-04-28 ENCOUNTER — Telehealth: Payer: Self-pay | Admitting: Cardiovascular Disease

## 2018-04-28 DIAGNOSIS — R931 Abnormal findings on diagnostic imaging of heart and coronary circulation: Secondary | ICD-10-CM

## 2018-04-28 DIAGNOSIS — I429 Cardiomyopathy, unspecified: Secondary | ICD-10-CM

## 2018-04-28 DIAGNOSIS — I5042 Chronic combined systolic (congestive) and diastolic (congestive) heart failure: Secondary | ICD-10-CM

## 2018-04-28 DIAGNOSIS — Z951 Presence of aortocoronary bypass graft: Secondary | ICD-10-CM

## 2018-04-28 NOTE — Telephone Encounter (Signed)
Returned call to patient. Patient wanting to know if Dr. Johnsie Cancel is wanting to reassess LVEF prior to his appointment in May. Patient states that if he does he wants to remind him that he is claustrophobic. Patient denies any Sx at this time. Will forward to Dr. Johnsie Cancel and his RN for review.

## 2018-04-28 NOTE — Telephone Encounter (Signed)
° °  Patient calling to speak with Pam I.  Patient declined, repeatedly to  give reason for call other than he needed to "clear some things up" Patient denies having any cardiac symptoms. Requesting call from nurse

## 2018-04-29 NOTE — Telephone Encounter (Signed)
Left message for patient to call back  

## 2018-04-29 NOTE — Telephone Encounter (Signed)
I think would be reasonable to reassess his ejection fraction prior to his appointment with echocardiogram given his cardiomyopathy.  Previously, 35%.  Did not desire ICD.  Please order echocardiogram prior to his appointment.  Thank you. [name], MD covering for Dr. Johnsie Cancel

## 2018-04-29 NOTE — Telephone Encounter (Signed)
Patient will have echo this week. Patient is anxious on how he is doing since his last test. Patient states he has not had any issues, but is just concerned.  Patient is willing to come in for echo this week. Patient was wondering about an MRI, but needs an open MRI since his is claustrophobic. Informed patient that there is not an open MRI for the cardiac testing that I know of. Patient's wife stated she has called around to other places in the state and there are none. Informed patient that echo should give [place] a good EF reading to see if it has improved from previous testing. Patient verbalized understanding and agreed to plan.

## 2018-05-01 ENCOUNTER — Ambulatory Visit (HOSPITAL_COMMUNITY): Payer: Medicare Other | Attending: Cardiology

## 2018-05-01 ENCOUNTER — Other Ambulatory Visit: Payer: Self-pay

## 2018-05-01 DIAGNOSIS — R931 Abnormal findings on diagnostic imaging of heart and coronary circulation: Secondary | ICD-10-CM | POA: Diagnosis not present

## 2018-05-01 DIAGNOSIS — I5042 Chronic combined systolic (congestive) and diastolic (congestive) heart failure: Secondary | ICD-10-CM | POA: Insufficient documentation

## 2018-05-01 DIAGNOSIS — Z951 Presence of aortocoronary bypass graft: Secondary | ICD-10-CM | POA: Diagnosis not present

## 2018-05-01 DIAGNOSIS — I429 Cardiomyopathy, unspecified: Secondary | ICD-10-CM | POA: Diagnosis not present

## 2018-05-01 MED ORDER — PERFLUTREN LIPID MICROSPHERE
1.0000 mL | INTRAVENOUS | Status: AC | PRN
  Administered 2018-05-01: 2 mL via INTRAVENOUS

## 2018-05-02 ENCOUNTER — Telehealth: Payer: Self-pay | Admitting: Cardiovascular Disease

## 2018-05-02 NOTE — Telephone Encounter (Signed)
° °  Patient calling to request order for oxygen. Patient states he only wants to speak with [facility] / Dr Kyla Balzarine nurse. Declined to give scheduler additional details

## 2018-05-02 NOTE — Telephone Encounter (Signed)
Called patient's wife (DPR) about message. Patient's wife is wanting to know if patient can get oxygen just to have in case patient comes down with coronavirus. Informed patient's wife that patient would have to qualify for oxygen for insurance to cover it and most of the time patient's PCP orders this. Informed patient's wife that patient needs to get a PCP. Encouraged patient's wife that if patient has any respiratory distress to call 911 or go to the ED. Patient's wife stated at this time patient is fine, she is just worried about him catching anything with his heart issues. Patient's wife stated she would see if she could have patient seen at her PCP's office.

## 2018-05-05 ENCOUNTER — Other Ambulatory Visit: Payer: Self-pay

## 2018-05-05 ENCOUNTER — Ambulatory Visit (INDEPENDENT_AMBULATORY_CARE_PROVIDER_SITE_OTHER): Payer: Medicare Other | Admitting: Pharmacist

## 2018-05-05 DIAGNOSIS — Z951 Presence of aortocoronary bypass graft: Secondary | ICD-10-CM | POA: Diagnosis not present

## 2018-05-05 DIAGNOSIS — Z5181 Encounter for therapeutic drug level monitoring: Secondary | ICD-10-CM | POA: Diagnosis not present

## 2018-05-05 LAB — POCT INR: INR: 1.8 — AB (ref 2.0–3.0)

## 2018-05-05 NOTE — Patient Instructions (Signed)
Description   Take 1.5 tablets tonight then continue on same dosage 1 tablet daily except 1/2 tablet on Sundays.  Recheck INR in 4 weeks. [facility]#[phone] Main [phone].

## 2018-05-15 ENCOUNTER — Telehealth: Payer: Self-pay

## 2018-05-15 NOTE — Telephone Encounter (Signed)
Per TA ok to be his PCP/I LMOVM for wife stating that he can be seen by Dr. Deborra Medina and informed of rule of no controlled without being in office not that he is on anything of that nature/We will schedule him a NP visit but if anything acute comes up we can see either of them via Webex or Tele-Visit/thx dmf

## 2018-05-30 ENCOUNTER — Telehealth: Payer: Self-pay

## 2018-05-30 NOTE — Telephone Encounter (Signed)

## 2018-05-30 NOTE — Telephone Encounter (Signed)
lmom for prescreen/drive thru 

## 2018-06-02 ENCOUNTER — Ambulatory Visit (INDEPENDENT_AMBULATORY_CARE_PROVIDER_SITE_OTHER): Payer: Medicare Other | Admitting: Pharmacist

## 2018-06-02 ENCOUNTER — Other Ambulatory Visit: Payer: Self-pay

## 2018-06-02 ENCOUNTER — Telehealth: Payer: Self-pay | Admitting: Cardiology

## 2018-06-02 DIAGNOSIS — Z951 Presence of aortocoronary bypass graft: Secondary | ICD-10-CM

## 2018-06-02 DIAGNOSIS — Z5181 Encounter for therapeutic drug level monitoring: Secondary | ICD-10-CM | POA: Diagnosis not present

## 2018-06-02 LAB — POCT INR: INR: 2 (ref 2.0–3.0)

## 2018-06-02 NOTE — Telephone Encounter (Signed)
Attempted to call patient back. No answer. Left message on VM to return call

## 2018-06-02 NOTE — Telephone Encounter (Signed)
Pt not interested in switching to a DOAC he want to leave his anticoagulation as it currently is with Coumadin.

## 2018-06-02 NOTE — Telephone Encounter (Signed)
New Message   Patient would like a nurse to call him with results once they come in

## 2018-06-13 ENCOUNTER — Other Ambulatory Visit: Payer: Self-pay | Admitting: Cardiovascular Disease

## 2018-06-20 ENCOUNTER — Telehealth: Payer: Self-pay | Admitting: Cardiovascular Disease

## 2018-06-20 NOTE — Telephone Encounter (Signed)
New Message    Patient states that he received a call. He thinks it was Pam Dr. Johnsie Cancel nurse. Please call.

## 2018-06-20 NOTE — Telephone Encounter (Signed)
Virtual Visit Pre-Appointment Phone Call  "(Name), I am calling you today to discuss your upcoming appointment. We are currently trying to limit exposure to the virus that causes COVID-19 by seeing patients at home rather than in the office."  1. "What is the BEST phone number to call the day of the visit?" - include this in appointment notes  2. "Do you have or have access to (through a family member/friend) a smartphone with video capability that we can use for your visit?" a. If yes - list this number in appt notes as "cell" (if different from BEST phone #) and list the appointment type as a VIDEO visit in appointment notes b. If no - list the appointment type as a PHONE visit in appointment notes  3. Confirm consent - "In the setting of the current Covid19 crisis, you are scheduled for a (phone or video) visit with your provider on (date) at (time).  Just as we do with many in-office visits, in order for you to participate in this visit, we must obtain consent.  If you'd like, I can send this to your mychart (if signed up) or email for you to review.  Otherwise, I can obtain your verbal consent now.  All virtual visits are billed to your insurance company just like a normal visit would be.  By agreeing to a virtual visit, we'd like you to understand that the technology does not allow for your provider to perform an examination, and thus may limit your provider's ability to fully assess your condition. If your provider identifies any concerns that need to be evaluated in person, we will make arrangements to do so.  Finally, though the technology is pretty good, we cannot assure that it will always work on either your or our end, and in the setting of a video visit, we may have to convert it to a phone-only visit.  In either situation, we cannot ensure that we have a secure connection.  Are you willing to proceed? Yes  4. Advise patient to be prepared - "Two hours prior to your appointment, go  ahead and check your blood pressure, pulse, oxygen saturation, and your weight (if you have the equipment to check those) and write them all down. When your visit starts, your provider will ask you for this information. If you have an Apple Watch or Kardia device, please plan to have heart rate information ready on the day of your appointment. Please have a pen and paper handy nearby the day of the visit as well."  5. Give patient instructions for MyChart download to smartphone OR Doximity/Doxy.me as below if video visit (depending on what platform provider is using)  6. Inform patient they will receive a phone call 15 minutes prior to their appointment time (may be from unknown caller ID) so they should be prepared to answer    TELEPHONE CALL NOTE  Bronislaw Switzer has been deemed a candidate for a follow-up tele-health visit to limit community exposure during the Covid-19 pandemic. I spoke with the patient via phone to ensure availability of phone/video source, confirm preferred email & phone number, and discuss instructions and expectations.  I reminded Orin Eberwein to be prepared with any vital sign and/or heart rhythm information that could potentially be obtained via home monitoring, at the time of his visit. I reminded Jamol Ginyard to expect a phone call prior to his visit.  Michaelyn Barter, RN 06/20/2018 8:53 AM  IF USING DOXIMITY or DOXY.ME -  The patient will receive a link just prior to their visit by text.     FULL LENGTH CONSENT FOR TELE-HEALTH VISIT   I hereby voluntarily request, consent and authorize [facility] and its employed or contracted physicians, physician assistants, nurse practitioners or other licensed health care professionals (the Practitioner), to provide me with telemedicine health care services (the "Services") as deemed necessary by the treating Practitioner. I acknowledge and consent to receive the Services by the Practitioner via telemedicine. I understand that the  telemedicine visit will involve communicating with the Practitioner through live audiovisual communication technology and the disclosure of certain medical information by electronic transmission. I acknowledge that I have been given the opportunity to request an in-person assessment or other available alternative prior to the telemedicine visit and am voluntarily participating in the telemedicine visit.  I understand that I have the right to withhold or withdraw my consent to the use of telemedicine in the course of my care at any time, without affecting my right to future care or treatment, and that the Practitioner or I may terminate the telemedicine visit at any time. I understand that I have the right to inspect all information obtained and/or recorded in the course of the telemedicine visit and may receive copies of available information for a reasonable fee.  I understand that some of the potential risks of receiving the Services via telemedicine include:  Marland Kitchen Delay or interruption in medical evaluation due to technological equipment failure or disruption; . Information transmitted may not be sufficient (e.g. poor resolution of images) to allow for appropriate medical decision making by the Practitioner; and/or  . In rare instances, security protocols could fail, causing a breach of personal health information.  Furthermore, I acknowledge that it is my responsibility to provide information about my medical history, conditions and care that is complete and accurate to the best of my ability. I acknowledge that Practitioner's advice, recommendations, and/or decision may be based on factors not within their control, such as incomplete or inaccurate data provided by me or distortions of diagnostic images or specimens that may result from electronic transmissions. I understand that the practice of medicine is not an exact science and that Practitioner makes no warranties or guarantees regarding treatment  outcomes. I acknowledge that I will receive a copy of this consent concurrently upon execution via email to the email address I last provided but may also request a printed copy by calling the office of [facility].    I understand that my insurance will be billed for this visit.   I have read or had this consent read to me. . I understand the contents of this consent, which adequately explains the benefits and risks of the Services being provided via telemedicine.  . I have been provided ample opportunity to ask questions regarding this consent and the Services and have had my questions answered to my satisfaction. . I give my informed consent for the services to be provided through the use of telemedicine in my medical care  By participating in this telemedicine visit I agree to the above.

## 2018-06-23 DIAGNOSIS — M25512 Pain in left shoulder: Secondary | ICD-10-CM | POA: Diagnosis not present

## 2018-06-23 DIAGNOSIS — M19112 Post-traumatic osteoarthritis, left shoulder: Secondary | ICD-10-CM | POA: Diagnosis not present

## 2018-06-23 NOTE — Progress Notes (Signed)
Virtual Visit via Video Note   This visit type was conducted due to national recommendations for restrictions regarding the COVID-19 Pandemic (e.g. social distancing) in an effort to limit this patient's exposure and mitigate transmission in our community.  Due to his co-morbid illnesses, this patient is at least at moderate risk for complications without adequate follow up.  This format is felt to be most appropriate for this patient at this time.  All issues noted in this document were discussed and addressed.  A limited physical exam was performed with this format.  Please refer to the patient's chart for his consent to telehealth for [facility].   Date:  06/23/2018   ID:  [name], DOB Jun 29, 1940, MRN [id number]  Patient Location: Home Provider Location: Office  PCP:  Patient, No Pcp Per  Cardiologist:  Jenkins Rouge, MD   Electrophysiologist:  None   Evaluation Performed:  Follow-Up Visit  Chief Complaint:  CHF  History of Present Illness:    Jason Day is a 78 y.o. 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 He initially presented w/ acute pulmonary edema and diuresed. Echo showed reduced LVEF, down to 30%, leading to LHC, which showed severe multivessel CAD. 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 He had post op afib and started on amiodarne and coumadin. He is now in NSR. He has been on guidelines medical therapy for systolic HF  After discussion today willing to start entresto Discussed holding lisinopril for 48 hours.   Has left rotator cuff tear and has seen Supple. Discussed holding coumadin for 4 days with no lovenox bridge prior to any surgery Cardiac status ok for arthroscopic surgery.   Still with issue diarrhea   He is claustrophobic and cannot have MRI.   Last echo on medical Rx 03/11/17 showed EF 35% severe LVH mild to moderate MR   MUGA Scan 06/17/17 EF 35%     The patient does not have symptoms concerning for COVID-19 infection (fever, chills, cough, or new shortness  of breath).    Past Medical History:  Diagnosis Date  . Atrial fibrillation ([place])   . Bleeding hemorrhoids   . Chronic combined systolic and diastolic heart failure ([place])   . Chronic sinus complaints    overuses afrin  . Coronary atherosclerosis of native coronary artery   . Hyperlipidemia    Past Surgical History:  Procedure Laterality Date  . BACK SURGERY  ~2014   disc repair  . CHOLECYSTECTOMY    . COLONOSCOPY  04/06/2014   Hemorrhoids and diverticulosis performed in [place]  . CORONARY ARTERY BYPASS GRAFT N/A 10/18/2016   Procedure: CORONARY ARTERY BYPASS GRAFTING (CABG) x five , using left internal mammary artery and right leg greater saphenous vein harvested endoscopically;  Surgeon: Gaye Pollack, MD;  Location: [place] OR;  Service: Open Heart Surgery;  Laterality: N/A;  . FOOT SURGERY    . HEMORRHOID BANDING    . HEMORRHOID SURGERY    . RIGHT/LEFT HEART CATH AND CORONARY ANGIOGRAPHY N/A 10/16/2016   Procedure: RIGHT/LEFT HEART CATH AND CORONARY ANGIOGRAPHY;  Surgeon: Belva Crome, MD;  Location: [facility];  Service: Cardiovascular;  Laterality: N/A;  . stents in liver    . TEE WITHOUT CARDIOVERSION N/A 10/18/2016   Procedure: TRANSESOPHAGEAL ECHOCARDIOGRAM (TEE);  Surgeon: Gaye Pollack, MD;  Location: [place];  Service: Open Heart Surgery;  Laterality: N/A;     No outpatient medications have been marked as taking for the 06/26/18 encounter (Appointment) with Josue Hector,  MD.     Allergies:   Patient has no known allergies.   Social History   Tobacco Use  . Smoking status: Former Research scientist (life sciences)  . Smokeless tobacco: Never Used  . Tobacco comment: Smoked on and off  Substance Use Topics  . Alcohol use: No  . Drug use: No     Family Hx: The patient's family history includes COPD in his father; Emphysema in his father; Healthy in his brother. There is no history of Diabetes, Cancer, Stomach cancer, or Colon cancer.  ROS:   Please see the history of present  illness.     All other systems reviewed and are negative.   Prior CV studies:   The following studies were reviewed today:  Echo 05/01/18 SR LAD/LVH no acute changes 01/07/17  Labs/Other Tests and Data Reviewed:    EKG:    Recent Labs: No results found for requested labs within last 8760 hours.   Recent Lipid Panel Lab Results  Component Value Date/Time   CHOL 187 04/22/2017 02:31 PM   CHOL 132 01/21/2017 08:03 AM   TRIG (H) 04/22/2017 02:31 PM    407.0 Triglyceride is over 400; calculations on Lipids are invalid.   HDL 29.80 (L) 04/22/2017 02:31 PM   HDL 38 (L) 01/21/2017 08:03 AM   CHOLHDL 6 04/22/2017 02:31 PM   LDLCALC 82 01/21/2017 08:03 AM   LDLDIRECT 119.0 04/22/2017 02:31 PM    Wt Readings from Last 3 Encounters:  09/10/17 86.8 kg  08/28/17 86.2 kg  07/17/17 87.7 kg     Objective:    Vital Signs:  There were no vitals taken for this visit.   White male No distress No JVP elevation No tachypnea No edema Skin warm and dry   ASSESSMENT & PLAN:    1. Acute on Chronic Systolic HF: hold lisinopril 48 hours start entresto f/u Pharm D MRI 6 months then readdress AICD  2. 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 And 35% echo 03/11/17  MUGA scan 35% does not want AICD and currently not needed   3. CAD: s/p CABG 09/2016. Stable w/o anginal symptoms. Continue ASA, BB and statin.   4. HTN: tends to run low with some dizziness    5. PAF: maintaining NSR w/ amiodarone. He is on coumadin for a/c. INRs are followed in our office.  Coumadin clinic every 4 weeks no need for bridging   6. HLD: LDL in August was 112 mg/dL. He is now on statin therapy with Crestor. Goal LDL is <70 mg/dL. Plan for f/u FLP and HFTs, scheduled 12/2. If not at goal, he will need further increase in Crestor dose. Currently at 10 mg.   7. Irritable bowel:  Colonoscopy latter this month better with bentyl  F/u Gessner   8. Ortho:  Clear to have left rotator cuff surgery hold  coumadin 4 days before no bridge needed   COVID-19 Education: The signs and symptoms of COVID-19 were discussed with the patient and how to seek care for testing (follow up with PCP or arrange E-visit).  The importance of social distancing was discussed today.  Time:   Today, I have spent 30 minutes with the patient with telehealth technology discussing the above problems.     Medication Adjustments/Labs and Tests Ordered: Current medicines are reviewed at length with the patient today.  Concerns regarding medicines are outlined above.   Tests Ordered: No orders of the defined types were placed in this encounter.   Medication Changes: No orders of  the defined types were placed in this encounter.   Disposition:  Follow up in a year  Signed, Jenkins Rouge, MD  06/23/2018 1:38 PM    [place]

## 2018-06-26 ENCOUNTER — Telehealth: Payer: Self-pay | Admitting: Cardiovascular Disease

## 2018-06-26 ENCOUNTER — Telehealth (INDEPENDENT_AMBULATORY_CARE_PROVIDER_SITE_OTHER): Payer: Medicare Other | Admitting: Cardiovascular Disease

## 2018-06-26 ENCOUNTER — Other Ambulatory Visit: Payer: Self-pay

## 2018-06-26 VITALS — BP 125/55 | HR 68 | Ht 72.0 in | Wt 178.0 lb

## 2018-06-26 DIAGNOSIS — I5042 Chronic combined systolic (congestive) and diastolic (congestive) heart failure: Secondary | ICD-10-CM | POA: Diagnosis not present

## 2018-06-26 DIAGNOSIS — I38 Endocarditis, valve unspecified: Secondary | ICD-10-CM | POA: Diagnosis not present

## 2018-06-26 DIAGNOSIS — Z79899 Other long term (current) drug therapy: Secondary | ICD-10-CM

## 2018-06-26 DIAGNOSIS — I255 Ischemic cardiomyopathy: Secondary | ICD-10-CM

## 2018-06-26 MED ORDER — PANTOPRAZOLE SODIUM 40 MG PO TBEC: 40.0000 mg | DELAYED_RELEASE_TABLET | Freq: Every day | ORAL | 3 refills | Status: DC

## 2018-06-26 MED ORDER — SPIRONOLACTONE 25 MG PO TABS: 12.5000 mg | ORAL_TABLET | Freq: Every day | ORAL | 3 refills | Status: DC

## 2018-06-26 MED ORDER — METOPROLOL SUCCINATE ER 25 MG PO TB24: 12.5000 mg | ORAL_TABLET | Freq: Every day | ORAL | 3 refills | Status: DC

## 2018-06-26 MED ORDER — AMIODARONE HCL 200 MG PO TABS: 200.0000 mg | ORAL_TABLET | Freq: Every day | ORAL | 3 refills | Status: DC

## 2018-06-26 MED ORDER — WARFARIN SODIUM 5 MG PO TABS: ORAL_TABLET | ORAL | 1 refills | Status: DC

## 2018-06-26 MED ORDER — SACUBITRIL-VALSARTAN 24-26 MG PO TABS: 1.0000 | ORAL_TABLET | Freq: Two times a day (BID) | ORAL | 11 refills | Status: DC

## 2018-06-26 MED ORDER — DIGOXIN 125 MCG PO TABS: 62.5000 ug | ORAL_TABLET | Freq: Every day | ORAL | 3 refills | Status: DC

## 2018-06-26 NOTE — Patient Instructions (Addendum)
Medication Instructions:  Your physician has recommended you make the following change in your medication:  1- Stop lisinopril- You must wait 36 hours after you stop lisinopril before you can start Entesto. 2- START Entresto 24/26 mg by mouth twice daily  If you need a refill on your cardiac medications before your next appointment, please call your pharmacy.   Lab work: Your physician recommends that you return for lab work in: 3 weeks for CMET, Lipid panel, and TSH (make sure you are fasting for this lab work.)  If you have labs (blood work) drawn today and your tests are completely normal, you will receive your results only by: Marland Kitchen MyChart Message (if you have MyChart) OR . A paper copy in the mail If you have any lab test that is abnormal or we need to change your treatment, we will call you to review the results.  Testing/Procedures: Your physician has recommended that you have a pulmonary function test in 6 months. Pulmonary Function Tests are a group of tests that measure how well air moves in and out of your lungs.  Follow-Up: At [facility], you and your health needs are our priority.  As part of our continuing mission to provide you with exceptional heart care, we have created designated Provider Care Teams.  These Care Teams include your primary Cardiologist (physician) and Advanced Practice Providers (APPs -  Physician Assistants and Nurse Practitioners) who all work together to provide you with the care you need, when you need it. You will need a follow up appointment in 3 months.   You may see Jenkins Rouge, MD or one of the following Advanced Practice Providers on your designated Care Team:   Truitt Merle, NP Cecilie Kicks, NP . Kathyrn Drown, NP  Your physician recommends that you schedule a follow-up appointment in 3 weeks with Pharmacy for titration Entresto.   Sacubitril; Valsartan oral tablet What is this medicine? SACUBITRIL; VALSARTAN (sak UE bi tril; val SAR tan)  is a combination of 2 drugs used to reduce the risk of death and hospitalizations in people with long-lasting heart failure. It is usually used with other medicines to treat heart failure. This medicine may be used for other purposes; ask your health care provider or pharmacist if you have questions. COMMON BRAND NAME(S): Entresto What should I tell my health care provider before I take this medicine? They need to know if you have any of these conditions: -diabetes and take a medicine that contains aliskiren -kidney disease -liver disease -an unusual or allergic reaction to sacubitril; valsartan, drugs called angiotensin converting enzyme (ACE) inhibitors, angiotensin II receptor blockers (ARBs), other medicines, foods, dyes, or preservatives -pregnant or trying to get pregnant -breast-feeding How should I use this medicine? Take this medicine by mouth with a glass of water. Follow the directions on the prescription label. You can take it with or without food. If it upsets your stomach, take it with food. Take your medicine at regular intervals. Do not take it more often than directed. Do not stop taking except on your doctor's advice. Do not take this medicine for at least 36 hours before or after you take an ACE inhibitor medicine. Talk to your health care provider if you are not sure if you take an ACE inhibitor. Talk to your pediatrician regarding the use of this medicine in children. Special care may be needed. Overdosage: If you think you have taken too much of this medicine contact a poison control center or emergency room  at once. NOTE: This medicine is only for you. Do not share this medicine with others. What if I miss a dose? If you miss a dose, take it as soon as you can. If it is almost time for next dose, take only that dose. Do not take double or extra doses. What may interact with this medicine? Do not take this medicine with any of the following medicines: -aliskiren if you have  diabetes -angiotensin-converting enzyme (ACE) inhibitors, like benazepril, captopril, enalapril, fosinopril, lisinopril, or ramipril This medicine may also interact with the following medicines: -angiotensin II receptor blockers (ARBs) like azilsartan, candesartan, eprosartan, irbesartan, losartan, olmesartan, telmisartan, or valsartan -lithium -NSAIDS, medicines for pain and inflammation, like ibuprofen or naproxen -potassium-sparing diuretics like amiloride, spironolactone, and triamterene -potassium supplements This list may not describe all possible interactions. Give your health care provider a list of all the medicines, herbs, non-prescription drugs, or dietary supplements you use. Also tell them if you smoke, drink alcohol, or use illegal drugs. Some items may interact with your medicine. What should I watch for while using this medicine? Tell your doctor or healthcare professional if your symptoms do not start to get better or if they get worse. Do not become pregnant while taking this medicine. Women should inform their doctor if they wish to become pregnant or think they might be pregnant. There is a potential for serious side effects to an unborn child. Talk to your health care professional or pharmacist for more information. You may get dizzy. Do not drive, use machinery, or do anything that needs mental alertness until you know how this medicine affects you. Do not stand or sit up quickly, especially if you are an older patient. This reduces the risk of dizzy or fainting spells. Avoid alcoholic drinks; they can make you more dizzy. What side effects may I notice from receiving this medicine? Side effects that you should report to your doctor or health care professional as soon as possible: -allergic reactions like skin rash, itching or hives, swelling of the face, lips, or tongue -signs and symptoms of increased potassium like muscle weakness; chest pain; or fast, irregular  heartbeat -signs and symptoms of kidney injury like trouble passing urine or change in the amount of urine -signs and symptoms of low blood pressure like feeling dizzy or lightheaded, or if you develop extreme fatigue Side effects that usually do not require medical attention (report to your doctor or health care professional if they continue or are bothersome): -cough This list may not describe all possible side effects. Call your doctor for medical advice about side effects. You may report side effects to FDA at 1-800-FDA-1088. Where should I keep my medicine? Keep out of the reach of children. Store at room temperature between 15 and 30 degrees C (59 and 86 degrees F). Throw away any unused medicine after the expiration date. NOTE: This sheet is a summary. It may not cover all possible information. If you have questions about this medicine, talk to your doctor, pharmacist, or health care provider.  2019 Elsevier/Gold Standard (2015-03-23 13:54:19)

## 2018-06-26 NOTE — Telephone Encounter (Signed)
New Message   Patient would like a call in reference to his sacubitril-valsartan (ENTRESTO) 24-26 MG. Patient has questions about this medication.

## 2018-06-26 NOTE — Telephone Encounter (Signed)
Got the numbers from another nurse at the office, she will mail the card to the patient. Called pharmacy with numbers for [facility] card. Lee at [facility] was able to get card to go through. Patient will be able to pick his Entresto up free of charge for 30 days.

## 2018-06-26 NOTE — Telephone Encounter (Signed)
Rx for coumadin sent to pharmacy

## 2018-06-26 NOTE — Telephone Encounter (Signed)
Called patient back about his message. Patient stated that medication is going to cost him over $400 dollars a month. Patient stated he can't afford to pay this much each month. Informed patient that a coupon for 30 days free is being mailed to him with his AVS. Explained to patient that in the mean time we would see if our pre-auth nurse can help him with the cost and help get it covered. Informed patient once he receives the new medication Entresto to make sure he has stopped and discontinued his lisinopril for at least 36 hours. Patient verbalized understanding on these instructions. Patient stated he needed refills as well. Sent in refills for patient's medications. Patient needed refills on warfarin also, will send message to coumadin clinic for this. Since patient is having lab work done in 3 weeks will see if Dr. Johnsie Cancel wants to add a digoxin level with lab work.

## 2018-06-26 NOTE — Telephone Encounter (Signed)
**Note De-Identified Giuseppina Quinones Obfuscation** I called [facility] and s/w Lattie Haw who states that she has helped the pt with this by calling his [employer] and finding out that the pt owes greater than $400 on his deductible.  Lattie Haw states that the pts wife is in agreement with paying the pts deductible but wants to be sure that the pt can tolerate Entresto prior to them paying this amount.  I have advised her that we provide our pts with a free 30 day co-pay card.  I read the message in this note that Dr Mariana Arn nurse Jeannene Patella wrote stating that she has mailed the pt a free 30 day Entresto co-pay card. Unfortunately the pt will not receive the card in time.  Lattie Haw at [place] states that if we will call them at [phone] and ask for Truman Hayward, as Lattie Haw is ending her shift now, and provide the info from the card they will be able to give him his free 30 days of Entresto asap.  Will forward to [facility] as I am working from home and do not have any Co-pay cards with me.

## 2018-06-26 NOTE — Telephone Encounter (Addendum)
**Note De-Identified Genavie Boettger Obfuscation** I will check on this. It sounds like the pt may have a high deductible.

## 2018-06-27 ENCOUNTER — Telehealth: Payer: Self-pay

## 2018-06-27 NOTE — Telephone Encounter (Signed)
lmom for prescreen  

## 2018-06-27 NOTE — Telephone Encounter (Signed)

## 2018-06-30 ENCOUNTER — Other Ambulatory Visit: Payer: Self-pay

## 2018-06-30 ENCOUNTER — Ambulatory Visit (INDEPENDENT_AMBULATORY_CARE_PROVIDER_SITE_OTHER): Payer: Medicare Other | Admitting: *Deleted

## 2018-06-30 ENCOUNTER — Telehealth: Payer: Self-pay | Admitting: Cardiovascular Disease

## 2018-06-30 DIAGNOSIS — Z5181 Encounter for therapeutic drug level monitoring: Secondary | ICD-10-CM

## 2018-06-30 DIAGNOSIS — Z951 Presence of aortocoronary bypass graft: Secondary | ICD-10-CM

## 2018-06-30 DIAGNOSIS — I5041 Acute combined systolic (congestive) and diastolic (congestive) heart failure: Secondary | ICD-10-CM

## 2018-06-30 DIAGNOSIS — M25812 Other specified joint disorders, left shoulder: Secondary | ICD-10-CM | POA: Diagnosis not present

## 2018-06-30 LAB — POCT INR: INR: 1.3 — AB (ref 2.0–3.0)

## 2018-06-30 NOTE — Telephone Encounter (Signed)
Spoke with patient. He is set up for labs on 6/1. He has a home blood pressure cuff. He will check his blood pressure at home and we will call patient after blood work results to review if we can titrate dose. Patient in agreement of plan.

## 2018-06-30 NOTE — Telephone Encounter (Signed)
New Message            Patient is calling to speak to [facility] about medication that Dr. Johnsie Cancel has prescribe. Pls call to advise

## 2018-06-30 NOTE — Telephone Encounter (Signed)
Patient wanted to let Dr. Johnsie Cancel know his INR was 1.3 today (a message was already sent to Dr. Johnsie Cancel)  He also was able to pick up his [name] and will start that on Wednesday, since he just stopped his lisinopril yesterday. Patient still needs to follow up with pharm D in 3 weeks for Entresto titration and lab work. Will send message to pharmacy to see about helping with appointment.

## 2018-07-01 DIAGNOSIS — M25512 Pain in left shoulder: Secondary | ICD-10-CM | POA: Diagnosis not present

## 2018-07-03 DIAGNOSIS — R197 Diarrhea, unspecified: Secondary | ICD-10-CM | POA: Diagnosis not present

## 2018-07-04 ENCOUNTER — Telehealth: Payer: Self-pay

## 2018-07-04 ENCOUNTER — Telehealth: Payer: Self-pay | Admitting: Pharmacist

## 2018-07-04 NOTE — Telephone Encounter (Signed)
LMOM FOR PRESCREEN  

## 2018-07-04 NOTE — Telephone Encounter (Signed)

## 2018-07-07 ENCOUNTER — Ambulatory Visit (INDEPENDENT_AMBULATORY_CARE_PROVIDER_SITE_OTHER): Payer: Medicare Other | Admitting: *Deleted

## 2018-07-07 ENCOUNTER — Other Ambulatory Visit: Payer: Self-pay

## 2018-07-07 DIAGNOSIS — Z951 Presence of aortocoronary bypass graft: Secondary | ICD-10-CM

## 2018-07-07 DIAGNOSIS — Z5181 Encounter for therapeutic drug level monitoring: Secondary | ICD-10-CM

## 2018-07-07 LAB — POCT INR: INR: 1.8 — AB (ref 2.0–3.0)

## 2018-07-08 DIAGNOSIS — M25512 Pain in left shoulder: Secondary | ICD-10-CM | POA: Diagnosis not present

## 2018-07-09 ENCOUNTER — Telehealth: Payer: Self-pay

## 2018-07-09 NOTE — Telephone Encounter (Signed)
Please comment on coumadin. 

## 2018-07-09 NOTE — Telephone Encounter (Signed)
   [facility] Pre-operative Risk Assessment    Request for surgical clearance:  1. What type of surgery is being performed? flex sigmoidoscopy    2. When is this surgery scheduled? 07/15/18   3. What type of clearance is required (medical clearance vs. Pharmacy clearance to hold med vs. Both)? Pharmacy Clearance  4. Are there any medications that need to be held prior to surgery and how long? Coumadin      5. Practice name and name of physician performing surgery? [facility] - Dr. Oletta Lamas    6. What is your office phone number [phone]    7.   What is your office fax number [phone]  8.   Anesthesia type (None, local, MAC, general) ? None listed    Jason Day 07/09/2018, 4:38 PM  _________________________________________________________________   (provider comments below)

## 2018-07-10 NOTE — Telephone Encounter (Signed)
Patient with diagnosis of afib on warfarin for anticoagulation.    Procedure: flex sigmoidoscopy Date of procedure: 07/15/2027  CHADS2-VASc score of  5 (CHF, HTN, AGE, DM2, stroke/tia x 2, CAD, AGE, male)  Per office protocol, patient can hold warfarin for 5 days prior to procedure.

## 2018-07-10 NOTE — Telephone Encounter (Signed)
   Primary Cardiologist: Jenkins Rouge, MD  Chart reviewed as part of pre-operative protocol coverage. Per pharmacy recommendations, patient can hold coumadin 5 days prior to his upcoming flex sigmoidoscopy. He should restart coumadin when cleared to do so by Dr. Oletta Lamas. Patient was notified and instructed to hold his coumadin starting 07/10/2018.   I will route this recommendation to the requesting party via Epic fax function and remove from pre-op pool.  Please call with questions.  Abigail Butts, PA-C 07/10/2018, 10:07 AM

## 2018-07-15 DIAGNOSIS — R197 Diarrhea, unspecified: Secondary | ICD-10-CM | POA: Diagnosis not present

## 2018-07-15 DIAGNOSIS — K529 Noninfective gastroenteritis and colitis, unspecified: Secondary | ICD-10-CM | POA: Diagnosis not present

## 2018-07-17 DIAGNOSIS — M25512 Pain in left shoulder: Secondary | ICD-10-CM | POA: Diagnosis not present

## 2018-07-17 DIAGNOSIS — R197 Diarrhea, unspecified: Secondary | ICD-10-CM | POA: Diagnosis not present

## 2018-07-18 ENCOUNTER — Telehealth: Payer: Self-pay | Admitting: *Deleted

## 2018-07-18 ENCOUNTER — Other Ambulatory Visit: Payer: Self-pay | Admitting: Cardiovascular Disease

## 2018-07-18 DIAGNOSIS — K529 Noninfective gastroenteritis and colitis, unspecified: Secondary | ICD-10-CM | POA: Diagnosis not present

## 2018-07-18 DIAGNOSIS — R0602 Shortness of breath: Secondary | ICD-10-CM

## 2018-07-18 NOTE — Telephone Encounter (Signed)
Did you read my note from 5/7 ??? Clear to have surgery hold coumadin 4 days before no bridge

## 2018-07-18 NOTE — Telephone Encounter (Signed)
Patient with diagnosis of afib on warfarin for anticoagulation.    Procedure: REVERSE TOTAL SHOULDER Date of procedure: 08/21/2018  CHADS2-VASc score of  5 (CHF, HTN, AGE, DM2, stroke/tia x 2, CAD, AGE, male)  Per office protocol, patient can hold warfarin for 4-5 days prior to procedure.    Will NOT need lovenox bridging

## 2018-07-18 NOTE — Telephone Encounter (Signed)
I will route this recommendation to the requesting party via Epic fax function and remove from pre-op pool.  Please call with questions.  [place], [place] 07/18/2018, 2:52 PM

## 2018-07-18 NOTE — Telephone Encounter (Signed)
   [facility] Pre-operative Risk Assessment    Request for surgical clearance:  1. What type of surgery is being performed? REVERSE TOTAL SHOULDER  2. When is this surgery scheduled? 08/21/18  3. What type of clearance is required (medical clearance vs. Pharmacy clearance to hold med vs. Both)? BOTH  4. Are there any medications that need to be held prior to surgery and how long? COUMADIN  5. Practice name and name of physician performing surgery? [facility]; DR. Lennette Bihari SUPPLE  6. What is your office phone number [phone]   7.   What is your office fax number [phone]  8.   Anesthesia type (None, local, MAC, general) ? GENERAL   Julaine Hua 07/18/2018, 12:26 PM  _________________________________________________________________   (provider comments below)

## 2018-07-21 ENCOUNTER — Other Ambulatory Visit: Payer: Medicare Other

## 2018-07-22 ENCOUNTER — Telehealth: Payer: Self-pay | Admitting: Pharmacist

## 2018-07-22 NOTE — Telephone Encounter (Signed)
Pt returned call, prefers to have labs checked next Monday since he is already scheduled for INR check that day. Moved lab appt and advised pt to monitor BP in the next week and we will follow up with readings next week as well.

## 2018-07-22 NOTE — Telephone Encounter (Signed)
LMOM - pt missed BMET scheduled yesterday for post Entresto start labs. Will need PharmD HF med titration visit after BMET has been done.

## 2018-07-23 ENCOUNTER — Telehealth: Payer: Self-pay

## 2018-07-23 NOTE — Telephone Encounter (Signed)
LMOM FOR PRESCREEN  

## 2018-07-23 NOTE — Telephone Encounter (Signed)

## 2018-07-25 ENCOUNTER — Telehealth: Payer: Self-pay

## 2018-07-25 NOTE — Telephone Encounter (Addendum)
    COVID-19 Pre-Screening Questions:  . In the past 7 to 10 days have you had a cough,  shortness of breath, headache, congestion, fever (100 or greater) body aches, chills, sore throat, or sudden loss of taste or sense of smell? . Have you been around anyone with known Covid 19. . Have you been around anyone who is awaiting Covid 19 test results in the past 7 to 10 days? . Have you been around anyone who has been exposed to Covid 19, or has mentioned symptoms of Covid 19 within the past 7 to 10 days?  If you have any concerns/questions about symptoms patients report during screening (either on the phone or at threshold). Contact the provider seeing the patient or DOD for further guidance.  If neither are available contact a member of the leadership team.          Pt answered No to all pre-screening questions.  [id number] 07/25/2018

## 2018-07-28 ENCOUNTER — Other Ambulatory Visit: Payer: Medicare Other | Admitting: *Deleted

## 2018-07-28 ENCOUNTER — Ambulatory Visit (INDEPENDENT_AMBULATORY_CARE_PROVIDER_SITE_OTHER): Payer: Medicare Other | Admitting: Pharmacist

## 2018-07-28 ENCOUNTER — Other Ambulatory Visit: Payer: Self-pay

## 2018-07-28 DIAGNOSIS — Z951 Presence of aortocoronary bypass graft: Secondary | ICD-10-CM | POA: Diagnosis not present

## 2018-07-28 DIAGNOSIS — I4891 Unspecified atrial fibrillation: Secondary | ICD-10-CM | POA: Diagnosis not present

## 2018-07-28 DIAGNOSIS — I5042 Chronic combined systolic (congestive) and diastolic (congestive) heart failure: Secondary | ICD-10-CM

## 2018-07-28 DIAGNOSIS — I5041 Acute combined systolic (congestive) and diastolic (congestive) heart failure: Secondary | ICD-10-CM

## 2018-07-28 DIAGNOSIS — I38 Endocarditis, valve unspecified: Secondary | ICD-10-CM

## 2018-07-28 DIAGNOSIS — Z5181 Encounter for therapeutic drug level monitoring: Secondary | ICD-10-CM | POA: Diagnosis not present

## 2018-07-28 DIAGNOSIS — M25812 Other specified joint disorders, left shoulder: Secondary | ICD-10-CM | POA: Diagnosis not present

## 2018-07-28 DIAGNOSIS — M25512 Pain in left shoulder: Secondary | ICD-10-CM | POA: Diagnosis not present

## 2018-07-28 LAB — COMPREHENSIVE METABOLIC PANEL
ALT: 17 IU/L (ref 0–44)
AST: 18 IU/L (ref 0–40)
Albumin/Globulin Ratio: 1.7 (ref 1.2–2.2)
Albumin: 3.7 g/dL (ref 3.7–4.7)
Alkaline Phosphatase: 123 IU/L — ABNORMAL HIGH (ref 39–117)
BUN/Creatinine Ratio: 12 (ref 10–24)
BUN: 15 mg/dL (ref 8–27)
Bilirubin Total: 1.6 mg/dL — ABNORMAL HIGH (ref 0.0–1.2)
CO2: 26 mmol/L (ref 20–29)
Calcium: 9 mg/dL (ref 8.6–10.2)
Chloride: 102 mmol/L (ref 96–106)
Creatinine, Ser: 1.21 mg/dL (ref 0.76–1.27)
GFR calc Af Amer: 66 mL/min/{1.73_m2} (ref >59)
GFR calc non Af Amer: 57 mL/min/{1.73_m2} — ABNORMAL LOW (ref >59)
Globulin, Total: 2.2 g/dL (ref 1.5–4.5)
Glucose: 109 mg/dL — ABNORMAL HIGH (ref 65–99)
Potassium: 4.4 mmol/L (ref 3.5–5.2)
Sodium: 142 mmol/L (ref 134–144)
Total Protein: 5.9 g/dL — ABNORMAL LOW (ref 6.0–8.5)

## 2018-07-28 LAB — TSH: TSH: 0.675 u[IU]/mL (ref 0.450–4.500)

## 2018-07-28 LAB — LIPID PANEL
Chol/HDL Ratio: 3.1 ratio (ref 0.0–5.0)
Cholesterol, Total: 85 mg/dL — ABNORMAL LOW (ref 100–199)
HDL: 27 mg/dL — ABNORMAL LOW (ref >39)
LDL Calculated: 48 mg/dL (ref 0–99)
Triglycerides: 51 mg/dL (ref 0–149)
VLDL Cholesterol Cal: 10 mg/dL (ref 5–40)

## 2018-07-28 LAB — POCT INR: INR: 1.8 — AB (ref 2.0–3.0)

## 2018-07-28 NOTE — Patient Instructions (Addendum)
Take 1.5 tablets today then continue taking 1 tablet daily.  Recheck INR in 6 weeks. Coumadin Clinic#[phone] Main [phone].

## 2018-07-29 ENCOUNTER — Telehealth: Payer: Self-pay | Admitting: Pharmacist

## 2018-07-29 ENCOUNTER — Telehealth: Payer: Self-pay

## 2018-07-29 DIAGNOSIS — E785 Hyperlipidemia, unspecified: Secondary | ICD-10-CM

## 2018-07-29 MED ORDER — ATORVASTATIN CALCIUM 20 MG PO TABS: 10.0000 mg | ORAL_TABLET | Freq: Every day | ORAL | 3 refills | Status: DC

## 2018-07-29 MED ORDER — SACUBITRIL-VALSARTAN 49-51 MG PO TABS: 1.0000 | ORAL_TABLET | Freq: Two times a day (BID) | ORAL | 11 refills | Status: DC

## 2018-07-29 NOTE — Addendum Note (Signed)
Addended by: Aris Georgia, Ramonte Mena L on: 07/29/2018 01:15 PM   Modules accepted: Orders

## 2018-07-29 NOTE — Telephone Encounter (Signed)
Spoke with patient. Will increase Entresto to 49/51mg  BID once he is finished the 24/26mg  BID supply. Patient is having shoulder surgery on 7/2-may get labs before that procedure. If not, will get labs at next INR check in July.

## 2018-07-29 NOTE — Telephone Encounter (Signed)
Called patient back with Dr. Kyla Balzarine recommendations, okay to take 10 mg daily and repeat labs in 3 months. Patient agreed to plan and will get lab work when he comes in for [place] in September.

## 2018-07-29 NOTE — Telephone Encounter (Signed)
-----   Message from Josue Hector, MD sent at 07/29/2018 11:01 AM EDT ----- Ok to take 10 mg daily and repeat labs in 3 months

## 2018-07-29 NOTE — Telephone Encounter (Signed)
Called patient and LVM to return call. Labs looked stable. BP in the office yesterday was 120/70. Will increase Entresto to 49/51mg  twice a day.

## 2018-07-31 DIAGNOSIS — Z7901 Long term (current) use of anticoagulants: Secondary | ICD-10-CM | POA: Diagnosis not present

## 2018-07-31 DIAGNOSIS — I4891 Unspecified atrial fibrillation: Secondary | ICD-10-CM | POA: Diagnosis not present

## 2018-07-31 DIAGNOSIS — I25810 Atherosclerosis of coronary artery bypass graft(s) without angina pectoris: Secondary | ICD-10-CM | POA: Diagnosis not present

## 2018-07-31 DIAGNOSIS — R159 Full incontinence of feces: Secondary | ICD-10-CM | POA: Diagnosis not present

## 2018-08-12 ENCOUNTER — Other Ambulatory Visit: Payer: Self-pay | Admitting: Cardiovascular Disease

## 2018-08-12 ENCOUNTER — Encounter: Payer: Medicare Other | Admitting: Family Medicine

## 2018-08-12 DIAGNOSIS — R0602 Shortness of breath: Secondary | ICD-10-CM

## 2018-08-15 NOTE — Patient Instructions (Addendum)
Jaquari Reckner    Your procedure is scheduled on: Thursday 08/21/2018   Report to [facility] Main  Entrance              Report to  Short Stay at   05:30 AM               YOU NEED TO HAVE A COVID 19 TEST ON_______ 11:05______, THIS TEST MUST BE DONE BEFORE SURGERY, COME TO [place].              ONCE YOUR COVID TEST IS COMPLETED, PLEASE BEGIN THE QUARANTINE INSTRUCTIONS AS OUTLINED IN YOUR HANDOUT.   Call this number if you have problems the morning of surgery [phone]    Remember: Do not eat food  :After Midnight.              NO SOLID FOOD AFTER MIDNIGHT THE NIGHT PRIOR TO SURGERY. NOTHING BY MOUTH EXCEPT CLEAR LIQUIDS UNTIL 3 HOURS PRIOR TO [place] SURGERY.              PLEASE FINISH ENSURE DRINK PER SURGEON ORDER 3 HOURS PRIOR TO SCHEDULED SURGERY TIME WHICH NEEDS TO BE COMPLETED AT _0430 am.   CLEAR LIQUID DIET   Foods Allowed                                                                     Foods Excluded  Coffee and tea, regular and decaf                             liquids that you cannot  Plain Jell-O in any flavor                                             see through such as: Fruit ices (not with fruit pulp)                                     milk, soups, orange juice  Iced Popsicles                                    All solid food Carbonated beverages, regular and diet                                    Cranberry, grape and apple juices Sports drinks like Gatorade Lightly seasoned clear broth or consume(fat free) Sugar, honey syrup  Sample Menu Breakfast                                Lunch  Supper Cranberry juice                    Beef broth                            Chicken broth Jell-O                                     Grape juice                           Apple juice Coffee or tea                        Jell-O                                       Popsicle                                                Coffee or tea                        Coffee or tea  _____________________________________________________________________               BRUSH YOUR TEETH MORNING OF SURGERY AND RINSE YOUR MOUTH OUT, NO CHEWING GUM CANDY OR MINTS.     Take these medicines the morning of surgery with A SIP OF WATER:                                  You may not have any metal on your body including hair pins and              piercings  Do not wear jewelry, make-up, lotions, powders or perfumes, deodorant                        Men may shave face and neck.   Do not bring valuables to the hospital. [place].  Contacts, dentures or bridgework may not be worn into surgery.  Leave suitcase in the car. After surgery it may be brought to your room.                  Please read over the following fact sheets you were given: _____________________________________________________________________             [facility] - Preparing for Surgery Before surgery, you can play an important role.  Because skin is not sterile, your skin needs to be as free of germs as possible.  You can reduce the number of germs on your skin by washing with CHG (chlorahexidine gluconate) soap before surgery.  CHG is an antiseptic cleaner which kills germs and bonds with the skin to continue killing germs even after washing. Please DO NOT use if you have an allergy to CHG or antibacterial soaps.  If your skin becomes reddened/irritated stop  using the CHG and inform your nurse when you arrive at Short Stay. Do not shave (including legs and underarms) for at least 48 hours prior to the first CHG shower.  You may shave your face/neck. Please follow these instructions carefully:  1.  Shower with CHG Soap the night before surgery and the  morning of Surgery.  2.  If you choose to wash your hair, wash your hair first as usual with your   normal  shampoo.  3.  After you shampoo, rinse your hair and body thoroughly to remove the  shampoo.                           4.  Use CHG as you would any other liquid soap.  You can apply chg directly  to the skin and wash                       Gently with a scrungie or clean washcloth.  5.  Apply the CHG Soap to your body ONLY FROM THE NECK DOWN.   Do not use on face/ open                           Wound or open sores. Avoid contact with eyes, ears mouth and genitals (private parts).                       Wash face,  Genitals (private parts) with your normal soap.             6.  Wash thoroughly, paying special attention to the area where your surgery  will be performed.  7.  Thoroughly rinse your body with warm water from the neck down.  8.  DO NOT shower/wash with your normal soap after using and rinsing off  the CHG Soap.                9.  Pat yourself dry with a clean towel.            10.  Wear clean pajamas.            11.  Place clean sheets on your bed the night of your first shower and do not  sleep with pets. Day of Surgery : Do not apply any lotions/deodorants the morning of surgery.  Please wear clean clothes to the hospital/surgery center.  FAILURE TO FOLLOW THESE INSTRUCTIONS MAY RESULT IN THE CANCELLATION OF YOUR SURGERY PATIENT SIGNATURE_________________________________  NURSE SIGNATURE__________________________________  ________________________________________________________________________   Adam Phenix  An incentive spirometer is a tool that can help keep your lungs clear and active. This tool measures how well you are filling your lungs with each breath. Taking long deep breaths may help reverse or decrease the chance of developing breathing (pulmonary) problems (especially infection) following:  A long period of time when you are unable to move or be active. BEFORE THE PROCEDURE   If the spirometer includes an indicator to show your best effort, your  nurse or respiratory therapist will set it to a desired goal.  If possible, sit up straight or lean slightly forward. Try not to slouch.  Hold the incentive spirometer in an upright position. INSTRUCTIONS FOR USE  1. Sit on the edge of your bed if possible, or sit up as far as you can in bed or on a chair. 2. Hold the incentive  spirometer in an upright position. 3. Breathe out normally. 4. Place the mouthpiece in your mouth and seal your lips tightly around it. 5. Breathe in slowly and as deeply as possible, raising the piston or the ball toward the top of the column. 6. Hold your breath for 3-5 seconds or for as long as possible. Allow the piston or ball to fall to the bottom of the column. 7. Remove the mouthpiece from your mouth and breathe out normally. 8. Rest for a few seconds and repeat Steps 1 through 7 at least 10 times every 1-2 hours when you are awake. Take your time and take a few normal breaths between deep breaths. 9. The spirometer may include an indicator to show your best effort. Use the indicator as a goal to work toward during each repetition. 10. After each set of 10 deep breaths, practice coughing to be sure your lungs are clear. If you have an incision (the cut made at the time of surgery), support your incision when coughing by placing a pillow or rolled up towels firmly against it. Once you are able to get out of bed, walk around indoors and cough well. You may stop using the incentive spirometer when instructed by your caregiver.  RISKS AND COMPLICATIONS  Take your time so you do not get dizzy or light-headed.  If you are in pain, you may need to take or ask for pain medication before doing incentive spirometry. It is harder to take a deep breath if you are having pain. AFTER USE  Rest and breathe slowly and easily.  It can be helpful to keep track of a log of your progress. Your caregiver can provide you with a simple table to help with this. If you are using the  spirometer at home, follow these instructions: [place] IF:   You are having difficultly using the spirometer.  You have trouble using the spirometer as often as instructed.  Your pain medication is not giving enough relief while using the spirometer.  You develop fever of 100.5 F (38.1 C) or higher. SEEK IMMEDIATE MEDICAL CARE IF:   You cough up bloody sputum that had not been present before.  You develop fever of 102 F (38.9 C) or greater.  You develop worsening pain at or near the incision site. MAKE SURE YOU:   Understand these instructions.  Will watch your condition.  Will get help right away if you are not doing well or get worse. Document Released: 06/18/2006 Document Revised: 04/30/2011 Document Reviewed: 08/19/2006 [facility] Patient Information 2014 [place], [place].   ________________________________________________________________________

## 2018-08-16 ENCOUNTER — Other Ambulatory Visit: Payer: Self-pay | Admitting: Cardiovascular Disease

## 2018-08-18 ENCOUNTER — Telehealth: Payer: Self-pay | Admitting: Cardiovascular Disease

## 2018-08-18 ENCOUNTER — Encounter (HOSPITAL_COMMUNITY): Payer: Self-pay

## 2018-08-18 ENCOUNTER — Other Ambulatory Visit (HOSPITAL_COMMUNITY)
Admission: RE | Admit: 2018-08-18 | Discharge: 2018-08-18 | Disposition: A | Discharge status: Home / Self Care | Payer: Medicare Other | Source: Ambulatory Visit | Attending: Orthopedic Surgery | Admitting: Orthopedic Surgery

## 2018-08-18 ENCOUNTER — Other Ambulatory Visit: Payer: Self-pay

## 2018-08-18 ENCOUNTER — Telehealth: Payer: Self-pay

## 2018-08-18 ENCOUNTER — Encounter (HOSPITAL_COMMUNITY)
Admission: RE | Admit: 2018-08-18 | Discharge: 2018-08-18 | Disposition: A | Discharge status: Home / Self Care | Payer: Medicare Other | Source: Ambulatory Visit | Attending: Orthopedic Surgery | Admitting: Orthopedic Surgery

## 2018-08-18 LAB — BASIC METABOLIC PANEL
Anion gap: 5 (ref 5–15)
BUN: 18 mg/dL (ref 8–23)
CO2: 28 mmol/L (ref 22–32)
Calcium: 8.5 mg/dL — ABNORMAL LOW (ref 8.9–10.3)
Chloride: 110 mmol/L (ref 98–111)
Creatinine, Ser: 1.17 mg/dL (ref 0.61–1.24)
GFR calc Af Amer: >60 mL/min (ref >60)
GFR calc non Af Amer: 60 mL/min — ABNORMAL LOW (ref >60)
Glucose, Bld: 103 mg/dL — ABNORMAL HIGH (ref 70–99)
Potassium: 3.9 mmol/L (ref 3.5–5.1)
Sodium: 143 mmol/L (ref 135–145)

## 2018-08-18 LAB — CBC
HCT: 42.6 % (ref 39.0–52.0)
Hemoglobin: 13.5 g/dL (ref 13.0–17.0)
MCH: 30.6 pg (ref 26.0–34.0)
MCHC: 31.7 g/dL (ref 30.0–36.0)
MCV: 96.6 fL (ref 80.0–100.0)
Platelets: 102 10*3/uL — ABNORMAL LOW (ref 150–400)
RBC: 4.41 MIL/uL (ref 4.22–5.81)
RDW: 15 % (ref 11.5–15.5)
WBC: 4.1 10*3/uL (ref 4.0–10.5)
nRBC: 0 % (ref 0.0–0.2)

## 2018-08-18 LAB — SURGICAL PCR SCREEN
MRSA, PCR: NEGATIVE
Staphylococcus aureus: NEGATIVE

## 2018-08-18 LAB — SARS CORONAVIRUS 2 (TAT 6-24 HRS): SARS Coronavirus 2: NEGATIVE

## 2018-08-18 NOTE — Telephone Encounter (Signed)
New message:     Jason Day from calling stating she need the patients last EKG Faxed to [phone]. Please call  If anymore questions.

## 2018-08-18 NOTE — Telephone Encounter (Signed)
TP-This patient has not been seen in our office as of yet/Dr. Deborra Medina agreed to be his PCP but that was not supposed to be changed prior to his first visit here/I see under procedures there shows "3.12.20 Echocardiogram Complete" and since he is not a patient here yet I cannot access the "Care Everywhere."/I am sorry/thx dmf

## 2018-08-18 NOTE — Progress Notes (Addendum)
Jason Felix PA  Coumadin and ASA were held 08/16/2018 per Patient's cardiologist Dr. Jenkins Rouge. EKG from his office called for completed in feb. 2020 Addendum: tried several times to get Patient"s EKG but was unable. Pt will have to get EKG day of surgery

## 2018-08-19 NOTE — Progress Notes (Signed)
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 former smoker (quit 02/20/68) with h/o Afib (on Coumadin), CAD (MI 2017, CABG 2018), HLD, chronic combined systolic and diastolic heart failure, left shoulder rotator cuff tear scheduled for above procedure 08/21/2018 with Dr. Justice Britain.   Pt last seen by cardiologist, Dr. Joie Bimler, 06/26/2018.  Per his OV note, "Has left rotator cuff tear and has seen Supple. Discussed holding coumadin for 4 days with no lovenox bridge prior to any surgery Cardiac status ok for arthroscopic surgery."    Pt reports last dose of Coumadin 08/16/2018. EKG not obtained at PAT visit, will get DOS.  Anticipate pt can proceed with planned procedure barring acute status change.   VS: BP (!) 101/55 (BP Location: Left Arm)   Pulse 63   Temp 37 C (Oral)   Resp 18   Ht 6' (1.829 m)   Wt 84.6 kg   SpO2 100%   BMI 25.29 kg/m   PROVIDERS: Lucille Passy, MD is PCP   Jenkins Rouge, MD is Cardiologist  LABS: Labs reviewed: Acceptable for surgery. (all labs ordered are listed, but only abnormal results are displayed)  Labs Reviewed  CBC - Abnormal; Notable for the following components:      Result Value   Platelets 102 (*)    All other components within normal limits  BASIC METABOLIC PANEL - Abnormal; Notable for the following components:   Glucose, Bld 103 (*)    Calcium 8.5 (*)    GFR calc non Af Amer 60 (*)    All other components within normal limits  SURGICAL PCR SCREEN     IMAGES:   EKG: 01/07/17 Rate 64 bpm Normal sinus rhythm  Left axis deviation  Left ventricular hypertrophy with QRS widening and repolarization abnormality Inferior infarct, age undetermined   CV: Echo  05/01/2018 IMPRESSIONS   1. The left ventricle has moderate-severely reduced systolic function, with an ejection fraction of 30-35%. The cavity size was mildly dilated. There is moderately increased left ventricular wall thickness. Left ventricular diastolic Doppler parameters  are consistent with pseudonormalization. Basal inferoseptal akinesis, basal inferior and basal inferolateral aneurysm. Mid inferior and mid inferolateral akinesis. 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  2. Left atrial size was moderately dilated.  3. Right atrial size was mildly dilated.  4. There is mild mitral annular calcification present. Mitral valve regurgitation is moderate by color flow Doppler. Suspect infarct-related MR in the setting of inferior/inferolateral wall motion abnormalities. No evidence of mitral valve stenosis.  5. The aortic valve is tricuspid Mild calcification of the aortic valve. no stenosis of the aortic valve.  6. There is mild dilatation of the ascending aorta measuring 40 mm.  7. The IVC was normal in size. PA systolic pressure 39 mmHg.  Cardiac Cath 10/16/16  Severe, calcific multivessel coronary artery disease.  Ostial and distal left main stenoses greater than 50%. Depending upon view/ In worst projection ostial left main is 75% and distal is 70% obstructed.  99% mid LAD and 90% distal LAD. The apical LAD is totally occluded.  75% ostial first obtuse marginal, 60% mid circumflex, and 50% ostial second obtuse marginal.  Total occlusion  of the mid RCA with faint left-to-right collaterals into what appears to be a relatively small PDA and LV branch.  Normal pulmonary artery pressures.  Ischemic cardiomyopathy with normal left ventricular filling pressures and LVEDP of 15 mmHg suggesting excellent therapy of acute on chronic systolic heart failure.  RECOMMENDATIONS:   Heart team approach to determine if/which  revascularization strategies are indicated. Past Medical History:  Diagnosis Date  . Atrial fibrillation ([place])   . Bleeding hemorrhoids   . Chronic combined systolic and diastolic heart failure ([place])   . Chronic sinus complaints    overuses afrin  . Coronary atherosclerosis of native coronary artery   . Hyperlipidemia   . Myocardial infarction [facility]) 09/2015    Past Surgical History:  Procedure Laterality Date  . BACK SURGERY  ~2014   disc repair  . CHOLECYSTECTOMY    . COLONOSCOPY  04/06/2014   Hemorrhoids and diverticulosis performed in [place]  . CORONARY ARTERY BYPASS GRAFT N/A 10/18/2016   Procedure: CORONARY ARTERY BYPASS GRAFTING (CABG) x five , using left internal mammary artery and right leg greater saphenous vein harvested endoscopically;  Surgeon: Gaye Pollack, MD;  Location: [place] OR;  Service: Open Heart Surgery;  Laterality: N/A;  . FOOT SURGERY    . HEMORRHOID BANDING    . HEMORRHOID SURGERY    . RIGHT/LEFT HEART CATH AND CORONARY ANGIOGRAPHY N/A 10/16/2016   Procedure: RIGHT/LEFT HEART CATH AND CORONARY ANGIOGRAPHY;  Surgeon: Belva Crome, MD;  Location: [place] CV LAB;  Service: Cardiovascular;  Laterality: N/A;  . stents in liver    . TEE WITHOUT CARDIOVERSION N/A 10/18/2016   Procedure: TRANSESOPHAGEAL ECHOCARDIOGRAM (TEE);  Surgeon: Gaye Pollack, MD;  Location: [place];  Service: Open Heart Surgery;  Laterality: N/A;    MEDICATIONS: . amiodarone (PACERONE) 200 MG tablet  . aspirin EC 81 MG tablet  . atorvastatin (LIPITOR) 20 MG tablet  . digoxin (LANOXIN) 0.125 MG tablet  . metoprolol succinate (TOPROL-XL) 25 MG 24 hr tablet  . oxymetazoline (AFRIN) 0.05 % nasal spray  . pantoprazole (PROTONIX) 40 MG tablet  . sacubitril-valsartan (ENTRESTO) 49-51 MG  . spironolactone (ALDACTONE) 25 MG tablet  . warfarin (COUMADIN) 5 MG tablet   No current facility-administered medications for this encounter.    Maia Plan [facility] Pre-Surgical Testing [phone] 08/19/18  1:04 PM

## 2018-08-19 NOTE — Anesthesia Preprocedure Evaluation (Addendum)
Anesthesia Evaluation  Patient identified by MRN, date of birth, ID band Patient awake    Reviewed: Allergy & Precautions, H&P , NPO status , Patient's Chart, lab work & pertinent test results  Airway Mallampati: II   Neck ROM: full    Dental   Pulmonary former smoker,    breath sounds clear to auscultation       Cardiovascular + angina + CAD, + Past MI, + CABG and +CHF  + dysrhythmias Atrial Fibrillation + Valvular Problems/Murmurs MR  Rhythm:regular Rate:Normal  EF 30-35%   Neuro/Psych    GI/Hepatic   Endo/Other    Renal/GU      Musculoskeletal   Abdominal   Peds  Hematology   Anesthesia Other Findings   Reproductive/Obstetrics                            Anesthesia Physical Anesthesia Plan  ASA: III  Anesthesia Plan: General   Post-op Pain Management:  Regional for Post-op pain   Induction: Intravenous  PONV Risk Score and Plan: 2 and Ondansetron, Dexamethasone and Treatment may vary due to age or medical condition  Airway Management Planned: Oral ETT  Additional Equipment:   Intra-op Plan:   Post-operative Plan: Extubation in OR  Informed Consent: I have reviewed the patients History and Physical, chart, labs and discussed the procedure including the risks, benefits and alternatives for the proposed anesthesia with the patient or authorized representative who has indicated his/her understanding and acceptance.       Plan Discussed with: CRNA, Anesthesiologist and Surgeon  Anesthesia Plan Comments: (See PAT note 08/18/2018, Konrad Felix, PA-C)       Anesthesia Quick Evaluation

## 2018-08-21 ENCOUNTER — Inpatient Hospital Stay (HOSPITAL_COMMUNITY): Payer: Medicare Other | Admitting: Physician Assistant

## 2018-08-21 ENCOUNTER — Inpatient Hospital Stay (HOSPITAL_COMMUNITY)
Admission: RE | Admit: 2018-08-21 | Discharge: 2018-08-22 | DRG: 483 | Disposition: A | Discharge status: Home / Self Care | Payer: Medicare Other | Attending: Orthopedic Surgery | Admitting: Orthopedic Surgery

## 2018-08-21 ENCOUNTER — Encounter (HOSPITAL_COMMUNITY)
Admission: RE | Disposition: A | Discharge status: Home / Self Care | Payer: Self-pay | Source: Home / Self Care | Attending: Orthopedic Surgery

## 2018-08-21 ENCOUNTER — Encounter (HOSPITAL_COMMUNITY): Payer: Self-pay

## 2018-08-21 ENCOUNTER — Inpatient Hospital Stay (HOSPITAL_COMMUNITY): Payer: Medicare Other | Admitting: Anesthesiology

## 2018-08-21 ENCOUNTER — Other Ambulatory Visit: Payer: Self-pay

## 2018-08-21 DIAGNOSIS — M12812 Other specific arthropathies, not elsewhere classified, left shoulder: Secondary | ICD-10-CM | POA: Diagnosis present

## 2018-08-21 DIAGNOSIS — I2510 Atherosclerotic heart disease of native coronary artery without angina pectoris: Secondary | ICD-10-CM | POA: Diagnosis present

## 2018-08-21 DIAGNOSIS — Z951 Presence of aortocoronary bypass graft: Secondary | ICD-10-CM

## 2018-08-21 DIAGNOSIS — M75102 Unspecified rotator cuff tear or rupture of left shoulder, not specified as traumatic: Secondary | ICD-10-CM | POA: Diagnosis present

## 2018-08-21 DIAGNOSIS — I252 Old myocardial infarction: Secondary | ICD-10-CM

## 2018-08-21 DIAGNOSIS — Z825 Family history of asthma and other chronic lower respiratory diseases: Secondary | ICD-10-CM

## 2018-08-21 DIAGNOSIS — Z1159 Encounter for screening for other viral diseases: Secondary | ICD-10-CM | POA: Diagnosis not present

## 2018-08-21 DIAGNOSIS — Z7901 Long term (current) use of anticoagulants: Secondary | ICD-10-CM | POA: Diagnosis not present

## 2018-08-21 DIAGNOSIS — M25812 Other specified joint disorders, left shoulder: Secondary | ICD-10-CM | POA: Diagnosis not present

## 2018-08-21 DIAGNOSIS — Z79899 Other long term (current) drug therapy: Secondary | ICD-10-CM | POA: Diagnosis not present

## 2018-08-21 DIAGNOSIS — Z7982 Long term (current) use of aspirin: Secondary | ICD-10-CM

## 2018-08-21 DIAGNOSIS — Z96612 Presence of left artificial shoulder joint: Secondary | ICD-10-CM

## 2018-08-21 DIAGNOSIS — M19012 Primary osteoarthritis, left shoulder: Secondary | ICD-10-CM | POA: Diagnosis not present

## 2018-08-21 DIAGNOSIS — I509 Heart failure, unspecified: Secondary | ICD-10-CM | POA: Diagnosis not present

## 2018-08-21 DIAGNOSIS — E785 Hyperlipidemia, unspecified: Secondary | ICD-10-CM | POA: Diagnosis present

## 2018-08-21 DIAGNOSIS — Z87891 Personal history of nicotine dependence: Secondary | ICD-10-CM

## 2018-08-21 DIAGNOSIS — I4891 Unspecified atrial fibrillation: Secondary | ICD-10-CM | POA: Diagnosis present

## 2018-08-21 DIAGNOSIS — I5042 Chronic combined systolic (congestive) and diastolic (congestive) heart failure: Secondary | ICD-10-CM | POA: Diagnosis present

## 2018-08-21 DIAGNOSIS — G8918 Other acute postprocedural pain: Secondary | ICD-10-CM | POA: Diagnosis not present

## 2018-08-21 HISTORY — PX: REVERSE SHOULDER ARTHROPLASTY: SHX5054

## 2018-08-21 SURGERY — ARTHROPLASTY, SHOULDER, TOTAL, REVERSE
Anesthesia: General | Laterality: Left

## 2018-08-21 MED ORDER — FENTANYL CITRATE (PF) 100 MCG/2ML IJ SOLN
INTRAMUSCULAR | Status: DC | PRN
  Administered 2018-08-21: 50 ug via INTRAVENOUS

## 2018-08-21 MED ORDER — BISACODYL 5 MG PO TBEC: 5.0000 mg | DELAYED_RELEASE_TABLET | Freq: Every day | ORAL | Status: DC | PRN

## 2018-08-21 MED ORDER — PHENYLEPHRINE 40 MCG/ML (10ML) SYRINGE FOR IV PUSH (FOR BLOOD PRESSURE SUPPORT)
PREFILLED_SYRINGE | INTRAVENOUS | Status: AC
  Filled 2018-08-21: qty 10

## 2018-08-21 MED ORDER — BUPIVACAINE HCL (PF) 0.5 % IJ SOLN
INTRAMUSCULAR | Status: DC | PRN
  Administered 2018-08-21: 15 mL via PERINEURAL

## 2018-08-21 MED ORDER — FENTANYL CITRATE (PF) 100 MCG/2ML IJ SOLN
INTRAMUSCULAR | Status: AC
  Filled 2018-08-21: qty 2

## 2018-08-21 MED ORDER — DEXAMETHASONE SODIUM PHOSPHATE 10 MG/ML IJ SOLN
INTRAMUSCULAR | Status: AC
  Filled 2018-08-21: qty 1

## 2018-08-21 MED ORDER — OXYCODONE HCL 5 MG PO TABS: 10.0000 mg | ORAL_TABLET | ORAL | Status: DC | PRN

## 2018-08-21 MED ORDER — LACTATED RINGERS IV SOLN
INTRAVENOUS | Status: DC
  Administered 2018-08-21: 16:00:00 via INTRAVENOUS

## 2018-08-21 MED ORDER — ONDANSETRON HCL 4 MG/2ML IJ SOLN
INTRAMUSCULAR | Status: DC | PRN
  Administered 2018-08-21: 4 mg via INTRAVENOUS

## 2018-08-21 MED ORDER — LIDOCAINE HCL (CARDIAC) PF 100 MG/5ML IV SOSY
PREFILLED_SYRINGE | INTRAVENOUS | Status: DC | PRN
  Administered 2018-08-21: 50 mg via INTRAVENOUS

## 2018-08-21 MED ORDER — SPIRONOLACTONE 12.5 MG HALF TABLET
12.5000 mg | ORAL_TABLET | Freq: Every evening | ORAL | Status: DC
  Administered 2018-08-21: 12.5 mg via ORAL
  Filled 2018-08-21: qty 1

## 2018-08-21 MED ORDER — METHOCARBAMOL 500 MG PO TABS
500.0000 mg | ORAL_TABLET | Freq: Four times a day (QID) | ORAL | Status: DC | PRN
  Administered 2018-08-22: 500 mg via ORAL
  Filled 2018-08-21: qty 1

## 2018-08-21 MED ORDER — BUPIVACAINE LIPOSOME 1.3 % IJ SUSP
INTRAMUSCULAR | Status: DC | PRN
  Administered 2018-08-21: 10 mL via PERINEURAL

## 2018-08-21 MED ORDER — OXYCODONE HCL 5 MG PO TABS: 5.0000 mg | ORAL_TABLET | Freq: Once | ORAL | Status: DC | PRN

## 2018-08-21 MED ORDER — PANTOPRAZOLE SODIUM 40 MG PO TBEC
40.0000 mg | DELAYED_RELEASE_TABLET | Freq: Every day | ORAL | Status: DC
  Administered 2018-08-21: 40 mg via ORAL
  Filled 2018-08-21 (×2): qty 1

## 2018-08-21 MED ORDER — OXYCODONE HCL 5 MG PO TABS: 5.0000 mg | ORAL_TABLET | ORAL | Status: DC | PRN

## 2018-08-21 MED ORDER — SUGAMMADEX SODIUM 200 MG/2ML IV SOLN
INTRAVENOUS | Status: DC | PRN
  Administered 2018-08-21: 200 mg via INTRAVENOUS

## 2018-08-21 MED ORDER — METHOCARBAMOL 500 MG PO TABS: 500.0000 mg | ORAL_TABLET | Freq: Three times a day (TID) | ORAL | 1 refills | Status: DC | PRN

## 2018-08-21 MED ORDER — METOCLOPRAMIDE HCL 5 MG/ML IJ SOLN: 5.0000 mg | Freq: Three times a day (TID) | INTRAMUSCULAR | Status: DC | PRN

## 2018-08-21 MED ORDER — PHENOL 1.4 % MT LIQD
1.0000 | OROMUCOSAL | Status: DC | PRN
  Filled 2018-08-21: qty 177

## 2018-08-21 MED ORDER — OXYMETAZOLINE HCL 0.05 % NA SOLN
1.0000 | Freq: Two times a day (BID) | NASAL | Status: DC
  Administered 2018-08-21: 1 via NASAL
  Filled 2018-08-21: qty 15

## 2018-08-21 MED ORDER — PROPOFOL 10 MG/ML IV BOLUS
INTRAVENOUS | Status: DC | PRN
  Administered 2018-08-21: 160 mg via INTRAVENOUS

## 2018-08-21 MED ORDER — CEFAZOLIN SODIUM-DEXTROSE 2-4 GM/100ML-% IV SOLN
2.0000 g | INTRAVENOUS | Status: AC
  Administered 2018-08-21: 2 g via INTRAVENOUS
  Filled 2018-08-21: qty 100

## 2018-08-21 MED ORDER — EPHEDRINE 5 MG/ML INJ
INTRAVENOUS | Status: AC
  Filled 2018-08-21: qty 10

## 2018-08-21 MED ORDER — LACTATED RINGERS IV SOLN
INTRAVENOUS | Status: DC
  Administered 2018-08-21: 06:00:00 via INTRAVENOUS

## 2018-08-21 MED ORDER — ONDANSETRON HCL 4 MG/2ML IJ SOLN: 4.0000 mg | Freq: Four times a day (QID) | INTRAMUSCULAR | Status: DC | PRN

## 2018-08-21 MED ORDER — ONDANSETRON HCL 4 MG PO TABS: 4.0000 mg | ORAL_TABLET | Freq: Three times a day (TID) | ORAL | 0 refills | Status: DC | PRN

## 2018-08-21 MED ORDER — ACETAMINOPHEN 325 MG PO TABS
325.0000 mg | ORAL_TABLET | Freq: Four times a day (QID) | ORAL | Status: DC | PRN
  Administered 2018-08-22: 06:00:00 650 mg via ORAL
  Filled 2018-08-21: qty 2

## 2018-08-21 MED ORDER — ROCURONIUM BROMIDE 10 MG/ML (PF) SYRINGE
PREFILLED_SYRINGE | INTRAVENOUS | Status: AC
  Filled 2018-08-21: qty 10

## 2018-08-21 MED ORDER — METOPROLOL SUCCINATE ER 25 MG PO TB24
12.5000 mg | ORAL_TABLET | Freq: Every day | ORAL | Status: DC
  Administered 2018-08-21: 12.5 mg via ORAL
  Filled 2018-08-21: qty 1

## 2018-08-21 MED ORDER — OXYCODONE-ACETAMINOPHEN 5-325 MG PO TABS: 1.0000 | ORAL_TABLET | ORAL | 0 refills | Status: DC | PRN

## 2018-08-21 MED ORDER — ONDANSETRON HCL 4 MG/2ML IJ SOLN
INTRAMUSCULAR | Status: AC
  Filled 2018-08-21: qty 2

## 2018-08-21 MED ORDER — MIDAZOLAM HCL 2 MG/2ML IJ SOLN
INTRAMUSCULAR | Status: AC
  Filled 2018-08-21: qty 2

## 2018-08-21 MED ORDER — MENTHOL 3 MG MT LOZG: 1.0000 | LOZENGE | OROMUCOSAL | Status: DC | PRN

## 2018-08-21 MED ORDER — DIGOXIN 0.0625 MG HALF TABLET
62.5000 ug | ORAL_TABLET | Freq: Every day | ORAL | Status: DC
  Administered 2018-08-21: 62.5 ug via ORAL
  Filled 2018-08-21 (×2): qty 1

## 2018-08-21 MED ORDER — DIPHENHYDRAMINE HCL 12.5 MG/5ML PO ELIX: 12.5000 mg | ORAL_SOLUTION | ORAL | Status: DC | PRN

## 2018-08-21 MED ORDER — ONDANSETRON HCL 4 MG PO TABS: 4.0000 mg | ORAL_TABLET | Freq: Four times a day (QID) | ORAL | Status: DC | PRN

## 2018-08-21 MED ORDER — METHOCARBAMOL 500 MG IVPB - SIMPLE MED
500.0000 mg | Freq: Four times a day (QID) | INTRAVENOUS | Status: DC | PRN
  Filled 2018-08-21: qty 50

## 2018-08-21 MED ORDER — MIDAZOLAM HCL 5 MG/5ML IJ SOLN
INTRAMUSCULAR | Status: DC | PRN
  Administered 2018-08-21: 1 mg via INTRAVENOUS

## 2018-08-21 MED ORDER — FENTANYL CITRATE (PF) 100 MCG/2ML IJ SOLN: 25.0000 ug | INTRAMUSCULAR | Status: DC | PRN

## 2018-08-21 MED ORDER — SACUBITRIL-VALSARTAN 49-51 MG PO TABS
1.0000 | ORAL_TABLET | Freq: Two times a day (BID) | ORAL | Status: DC
  Administered 2018-08-21 (×2): 1 via ORAL
  Filled 2018-08-21 (×3): qty 1

## 2018-08-21 MED ORDER — PHENYLEPHRINE HCL (PRESSORS) 10 MG/ML IV SOLN
INTRAVENOUS | Status: AC
  Filled 2018-08-21: qty 1

## 2018-08-21 MED ORDER — ROCURONIUM BROMIDE 10 MG/ML (PF) SYRINGE
PREFILLED_SYRINGE | INTRAVENOUS | Status: DC | PRN
  Administered 2018-08-21: 10 mg via INTRAVENOUS
  Administered 2018-08-21: 60 mg via INTRAVENOUS

## 2018-08-21 MED ORDER — OXYCODONE HCL 5 MG/5ML PO SOLN: 5.0000 mg | Freq: Once | ORAL | Status: DC | PRN

## 2018-08-21 MED ORDER — HYDROMORPHONE HCL 1 MG/ML IJ SOLN: 0.5000 mg | INTRAMUSCULAR | Status: DC | PRN

## 2018-08-21 MED ORDER — POLYETHYLENE GLYCOL 3350 17 G PO PACK: 17.0000 g | PACK | Freq: Every day | ORAL | Status: DC | PRN

## 2018-08-21 MED ORDER — SODIUM CHLORIDE 0.9 % IR SOLN
Status: DC | PRN
  Administered 2018-08-21: 1000 mL

## 2018-08-21 MED ORDER — MAGNESIUM CITRATE PO SOLN: 1.0000 | Freq: Once | ORAL | Status: DC | PRN

## 2018-08-21 MED ORDER — CHLORHEXIDINE GLUCONATE 4 % EX LIQD: 60.0000 mL | Freq: Once | CUTANEOUS | Status: DC

## 2018-08-21 MED ORDER — ALUM & MAG HYDROXIDE-SIMETH 200-200-20 MG/5ML PO SUSP: 30.0000 mL | ORAL | Status: DC | PRN

## 2018-08-21 MED ORDER — SODIUM CHLORIDE 0.9 % IV SOLN
INTRAVENOUS | Status: DC | PRN
  Administered 2018-08-21: 50 ug/min via INTRAVENOUS

## 2018-08-21 MED ORDER — EPHEDRINE SULFATE 50 MG/ML IJ SOLN
INTRAMUSCULAR | Status: DC | PRN
  Administered 2018-08-21: 10 mg via INTRAVENOUS

## 2018-08-21 MED ORDER — DEXAMETHASONE SODIUM PHOSPHATE 10 MG/ML IJ SOLN
INTRAMUSCULAR | Status: DC | PRN
  Administered 2018-08-21: 10 mg via INTRAVENOUS

## 2018-08-21 MED ORDER — PROPOFOL 10 MG/ML IV BOLUS
INTRAVENOUS | Status: AC
  Filled 2018-08-21: qty 20

## 2018-08-21 MED ORDER — TRANEXAMIC ACID-NACL 1000-0.7 MG/100ML-% IV SOLN
1000.0000 mg | INTRAVENOUS | Status: AC
  Administered 2018-08-21: 1000 mg via INTRAVENOUS
  Filled 2018-08-21: qty 100

## 2018-08-21 MED ORDER — METOCLOPRAMIDE HCL 5 MG PO TABS: 5.0000 mg | ORAL_TABLET | Freq: Three times a day (TID) | ORAL | Status: DC | PRN

## 2018-08-21 MED ORDER — ASPIRIN EC 81 MG PO TBEC
81.0000 mg | DELAYED_RELEASE_TABLET | Freq: Every day | ORAL | Status: DC
  Administered 2018-08-21: 16:00:00 81 mg via ORAL
  Filled 2018-08-21 (×2): qty 1

## 2018-08-21 MED ORDER — LIDOCAINE 2% (20 MG/ML) 5 ML SYRINGE
INTRAMUSCULAR | Status: AC
  Filled 2018-08-21: qty 5

## 2018-08-21 MED ORDER — DOCUSATE SODIUM 100 MG PO CAPS
100.0000 mg | ORAL_CAPSULE | Freq: Two times a day (BID) | ORAL | Status: DC
  Filled 2018-08-21 (×2): qty 1

## 2018-08-21 SURGICAL SUPPLY — 63 items
ARTHREX UNIVERS REVERS SUTURE CUP (Orthopedic Implant) ×1 IMPLANT
BAG ZIPLOCK 12X15 (MISCELLANEOUS) ×2 IMPLANT
BLADE SAW SGTL 83.5X18.5 (BLADE) ×2 IMPLANT
COOLER ICEMAN CLASSIC (MISCELLANEOUS) IMPLANT
COVER SURGICAL LIGHT HANDLE (MISCELLANEOUS) ×2 IMPLANT
COVER WAND RF STERILE (DRAPES) IMPLANT
CUP SUT UNIV REVERS 39+2 LT (Shoulder) ×1 IMPLANT
DERMABOND ADVANCED (GAUZE/BANDAGES/DRESSINGS) ×1
DERMABOND ADVANCED .7 DNX12 (GAUZE/BANDAGES/DRESSINGS) ×1 IMPLANT
DRAPE INCISE IOBAN 66X45 STRL (DRAPES) IMPLANT
DRAPE ORTHO SPLIT 77X108 STRL (DRAPES) ×2
DRAPE SURG 17X11 SM STRL (DRAPES) ×2 IMPLANT
DRAPE SURG ORHT 6 SPLT 77X108 (DRAPES) ×2 IMPLANT
DRAPE U-SHAPE 47X51 STRL (DRAPES) ×2 IMPLANT
DRESSING AQUACEL AG SP 3.5X10 (GAUZE/BANDAGES/DRESSINGS) IMPLANT
DRSG AQUACEL AG ADV 3.5X10 (GAUZE/BANDAGES/DRESSINGS) ×2 IMPLANT
DRSG AQUACEL AG SP 3.5X10 (GAUZE/BANDAGES/DRESSINGS) ×2
DURAPREP 26ML APPLICATOR (WOUND CARE) ×2 IMPLANT
ELECT BLADE TIP CTD 4 INCH (ELECTRODE) ×2 IMPLANT
ELECT REM PT RETURN 15FT ADLT (MISCELLANEOUS) ×2 IMPLANT
FACESHIELD WRAPAROUND (MASK) ×10 IMPLANT
FACESHIELD WRAPAROUND OR TEAM (MASK) ×3 IMPLANT
GLENOID UNI REV MOD 24 +2 LAT (Joint) ×1 IMPLANT
GLENOSPHERE 39+4 LAT/24 UNI RV (Joint) ×1 IMPLANT
GLOVE BIO SURGEON STRL SZ7.5 (GLOVE) ×2 IMPLANT
GLOVE BIO SURGEON STRL SZ8 (GLOVE) ×2 IMPLANT
GLOVE SS BIOGEL STRL SZ 7 (GLOVE) ×1 IMPLANT
GLOVE SS BIOGEL STRL SZ 7.5 (GLOVE) ×1 IMPLANT
GLOVE SUPERSENSE BIOGEL SZ 7 (GLOVE) ×1
GLOVE SUPERSENSE BIOGEL SZ 7.5 (GLOVE) ×1
GOWN STRL REUS W/TWL LRG LVL3 (GOWN DISPOSABLE) ×4 IMPLANT
INSERT HUMERAL MED 39/ +3 (Shoulder) IMPLANT
INSERT MEDIUM HUMERAL 39/ +3 (Shoulder) ×1 IMPLANT
KIT BASIN OR (CUSTOM PROCEDURE TRAY) ×2 IMPLANT
KIT TURNOVER KIT A (KITS) IMPLANT
MANIFOLD NEPTUNE II (INSTRUMENTS) ×2 IMPLANT
NDL TAPERED W/ NITINOL LOOP (MISCELLANEOUS) ×1 IMPLANT
NEEDLE TAPERED W/ NITINOL LOOP (MISCELLANEOUS) ×2 IMPLANT
NS IRRIG 1000ML POUR BTL (IV SOLUTION) ×2 IMPLANT
PACK SHOULDER (CUSTOM PROCEDURE TRAY) ×2 IMPLANT
PAD ARMBOARD 7.5X6 YLW CONV (MISCELLANEOUS) ×2 IMPLANT
PAD COLD SHLDR WRAP-ON (PAD) ×1 IMPLANT
PIN SET MODULAR GLENOID SYSTEM (PIN) ×1 IMPLANT
PROTECTOR NERVE ULNAR (MISCELLANEOUS) ×2 IMPLANT
RESTRAINT HEAD UNIVERSAL NS (MISCELLANEOUS) ×2 IMPLANT
SCREW CENTRAL MOD 30MM (Screw) ×1 IMPLANT
SCREW PERI LOCK 5.5X16 (Screw) ×1 IMPLANT
SCREW PERI LOCK 5.5X24 (Screw) ×2 IMPLANT
SCREW PERI LOCK 5.5X32 (Screw) ×1 IMPLANT
SLING ARM FOAM STRAP LRG (SOFTGOODS) ×1 IMPLANT
SLING ARM FOAM STRAP MED (SOFTGOODS) IMPLANT
SPONGE LAP 18X18 RF (DISPOSABLE) IMPLANT
STEM HUMERAL UNI REVERS SZ9 (Stem) ×1 IMPLANT
SUCTION FRAZIER HANDLE 12FR (TUBING) ×1
SUCTION TUBE FRAZIER 12FR DISP (TUBING) ×1 IMPLANT
SUT MNCRL AB 3-0 PS2 18 (SUTURE) ×2 IMPLANT
SUT MON AB 2-0 CT1 36 (SUTURE) ×2 IMPLANT
SUT VIC AB 1 CT1 36 (SUTURE) ×2 IMPLANT
SUTURE TAPE 1.3 40 TPR END (SUTURE) ×2 IMPLANT
SUTURETAPE 1.3 40 TPR END (SUTURE) ×4
TOWEL OR 17X26 10 PK STRL BLUE (TOWEL DISPOSABLE) ×4 IMPLANT
TOWEL OR NON WOVEN STRL DISP B (DISPOSABLE) ×2 IMPLANT
WATER STERILE IRR 1000ML POUR (IV SOLUTION) ×4 IMPLANT

## 2018-08-21 NOTE — Anesthesia Procedure Notes (Signed)
Procedure Name: Intubation Date/Time: 08/21/2018 7:47 AM Performed by: Glory Buff, CRNA Pre-anesthesia Checklist: Patient identified, Emergency Drugs available, Suction available and Patient being monitored Patient Re-evaluated:Patient Re-evaluated prior to induction Oxygen Delivery Method: Circle system utilized Preoxygenation: Pre-oxygenation with 100% oxygen Induction Type: IV induction Ventilation: Mask ventilation without difficulty Laryngoscope Size: Miller and 3 Grade View: Grade I Tube type: Oral Tube size: 7.5 mm Number of attempts: 1 Airway Equipment and Method: Stylet and Oral airway Placement Confirmation: ETT inserted through vocal cords under direct vision,  positive ETCO2 and breath sounds checked- equal and bilateral Secured at: 23 cm Tube secured with: Tape Dental Injury: Teeth and Oropharynx as per pre-operative assessment

## 2018-08-21 NOTE — H&P (Signed)
Jason Day    Chief Complaint: left shoulder rotator cuff tear arthropathy HPI: The patient is a 78 y.o. 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 Atrial fibrillation ([place])   . Bleeding hemorrhoids   . Chronic combined systolic and diastolic heart failure ([place])   . Chronic sinus complaints    overuses afrin  . Coronary atherosclerosis of native coronary artery   . Hyperlipidemia   . Myocardial infarction [facility]) 09/2015    Past Surgical History:  Procedure Laterality Date  . BACK SURGERY  ~2014   disc repair  . CHOLECYSTECTOMY    . COLONOSCOPY  04/06/2014   Hemorrhoids and diverticulosis performed in [place]  . CORONARY ARTERY BYPASS GRAFT N/A 10/18/2016   Procedure: CORONARY ARTERY BYPASS GRAFTING (CABG) x five , using left internal mammary artery and right leg greater saphenous vein harvested endoscopically;  Surgeon: Gaye Pollack, MD;  Location: [place] OR;  Service: Open Heart Surgery;  Laterality: N/A;  . FOOT SURGERY    . HEMORRHOID BANDING    . HEMORRHOID SURGERY    . RIGHT/LEFT HEART CATH AND CORONARY ANGIOGRAPHY N/A 10/16/2016   Procedure: RIGHT/LEFT HEART CATH AND CORONARY ANGIOGRAPHY;  Surgeon: Belva Crome, MD;  Location: [place] CV LAB;  Service: Cardiovascular;  Laterality: N/A;  . stents in liver    . TEE WITHOUT CARDIOVERSION N/A 10/18/2016   Procedure: TRANSESOPHAGEAL ECHOCARDIOGRAM (TEE);  Surgeon: Gaye Pollack, MD;  Location: [place];  Service: Open Heart Surgery;  Laterality: N/A;    Family History  Problem Relation Age of Onset  . COPD Father   . Emphysema Father   . Healthy Brother   . Diabetes Neg Hx   . Cancer Neg Hx   . Stomach cancer Neg Hx   . Colon cancer Neg Hx     Social History:  reports that he quit smoking about 50 years ago. His smoking use included cigarettes. He smoked 0.50 packs per day. He has never used  smokeless tobacco. He reports that he does not drink alcohol or use drugs.   Medications Prior to Admission  Medication Sig Dispense Refill  . aspirin EC 81 MG tablet Take 81 mg by mouth daily.    Marland Kitchen atorvastatin (LIPITOR) 20 MG tablet Take 0.5 tablets (10 mg total) by mouth daily. (Patient taking differently: Take 10 mg by mouth every evening. ) 45 tablet 3  . digoxin (LANOXIN) 0.125 MG tablet Take 0.5 tablets (62.5 mcg total) by mouth daily. 45 tablet 3  . metoprolol succinate (TOPROL-XL) 25 MG 24 hr tablet Take 0.5 tablets (12.5 mg total) by mouth daily. (Patient taking differently: Take 12.5 mg by mouth at bedtime. ) 45 tablet 3  . oxymetazoline (AFRIN) 0.05 % nasal spray Place 2 sprays into both nostrils at bedtime as needed for congestion.    . sacubitril-valsartan (ENTRESTO) 49-51 MG Take 1 tablet by mouth 2 (two) times daily. 60 tablet 11  . spironolactone (ALDACTONE) 25 MG tablet Take 0.5 tablets (12.5 mg total) by mouth daily. (Patient taking differently: Take 12.5 mg by mouth every evening. ) 45 tablet 3  . warfarin (COUMADIN) 5 MG tablet Take as directed by [facility] (Patient taking differently: Take 5 mg by mouth every evening. Take as directed by [facility]) 90 tablet 1  . amiodarone (PACERONE) 200 MG tablet Take 1 tablet (200 mg total) by mouth daily. (Patient not  taking: Reported on 08/11/2018) 90 tablet 3  . pantoprazole (PROTONIX) 40 MG tablet Take 1 tablet (40 mg total) by mouth daily. (Patient not taking: Reported on 08/11/2018) 90 tablet 3     Physical Exam: Left shoulder demonstrates a profoundly restricted range of motion with pain and global weakness  Vitals  Temp:  [98.2 F (36.8 C)] 98.2 F (36.8 C) (07/02 0543) Pulse Rate:  [59] 59 (07/02 0543) Resp:  [18] 18 (07/02 0543) BP: (112)/(67) 112/67 (07/02 0543) SpO2:  [100 %] 100 % (07/02 0543) Weight:  [84.6 kg] 84.6 kg (07/02 0500)  Assessment/Plan  Impression: left shoulder rotator cuff tear  arthropathy  Plan of Action: Procedure(s): REVERSE SHOULDER ARTHROPLASTY  Jason Day 08/21/2018, 6:32 AM Contact # [phone]

## 2018-08-21 NOTE — Op Note (Signed)
08/21/2018  9:27 AM  PATIENT:   Jason Day  78 y.o. male  PRE-OPERATIVE DIAGNOSIS:  left shoulder rotator cuff tear arthropathy  POST-OPERATIVE DIAGNOSIS: Same  PROCEDURE: Left shoulder reverse arthroplasty utilizing a press-fit Arthrex size 9 stem, a +3 polyethylene insert, 39/+4 glenosphere, small baseplate  SURGEON:  Jason Day, Jason Clines M.D.  ASSISTANTS: Jason Loges, PA-C  ANESTHESIA:   General endotracheal and interscalene block with Exparel  EBL: 250 cc  SPECIMEN: None  Drains: None   PATIENT DISPOSITION:  PACU - hemodynamically stable.    PLAN OF CARE: Admit for overnight observation  Brief history:  [name] is a 78 year old gentleman who has had chronic and progressively increasing left shoulder pain with profound restrictions in mobility and functional limitations related to an end stage rotator cuff tear arthropathy.  He is brought to the operating this time for planned left shoulder reverse arthroplasty.   Preoperatively he was counseled regarding treatment options as well as the potential risks versus benefits thereof.  Possible surgical complications were reviewed including bleeding, infection, neurovascular injury, persistent pain, loss of motion, anesthetic complication, failure the implant, and possible need for additional surgery.  He understands, and accepts, and agrees plan procedure  Procedure detail:  After undergoing routine preop evaluation patient received prophylactic antibiotics and interscalene block with Exparel were established in the holding area by the anesthesia department.  Placed supine on the operating table and underwent the smooth induction of a general endotracheal anesthesia.  Placed into the beachchair position and appropriately padded and protected.  The left shoulder girdle region was sterilely prepped and draped in standard fashion.  Timeout was called.  An anterior deltopectoral approach left shoulder was made through 10 cm incision.  Skin  flaps elevated dissection carried deeply and the deltopectoral interval was then developed from proximal to distal with a vein taken laterally.  Upper centimeter of the pectoralis major was tenotomized to enhance exposure.  The long head biceps tendon was then unroofed and tenodesed at the upper border of the pectoralis major tendon and the proximal segment was then excised.  The subscapularis was then divided from its lesser tuberosity insertion with electrocautery and the free margin was tagged with a pair of suture tape sutures.  We then divided the capsular attachments from the anterior and inferior margins of the humeral neck and humeral head was delivered through the wound.  We outlined our proposed humeral head resection using the extra medullary guide and this was then completed with the oscillating saw with care taken to protect the residual posterior rotator cuff.  Metal cap placed over the cut proximal humeral surface we then exposed the glenoid with appropriate retractors and then performed a circumferential labral resection gaining complete visualization the periphery of the glenoid.  A guidepin was then directed into the center of the glenoid with a 10 degree inferior tilt and the glenoid was then reamed to a stable subchondral bony bed central drill hole was then prepared drilling and tapping and a 30 mm screw was placed on your baseplate which was then inserted with excellent fit and fixation.  A 39/+4 glenosphere was then impacted onto the baseplate and locking screw was placed.  We then returned our attention back to the proximal humerus where the canal was opened with hand reaming and broached up initially to a size 10 but this stem was prominent so we downsized to a 9 and seated this more deeply into the humerus and then prepared the proximal metaphyseal region and  a trial reduction showed excellent soft tissue balance and fit.  Our final implant was then assembled and inserted into the humeral  canal with a press-fit fixation much to our satisfaction.  A series of trial reduction was then performed and the +3 poly-showed excellent soft tissue balance good stability good motion.  The final poly-was then impacted into our stem final reduction was then performed.  The wound was copes irrigated hemostasis was obtained.  The subscapularis was then repaired back to the lesser tuberosity region utilizing the eyelets on the collar of our humeral stem once this was completed the arm easily achieved 45 degrees of external rotation without excessive tension on the subscap.  The wound again was irrigated final hemostasis was obtained.  The deltopectoral interval was repaired with a series of interrupted figure-of-eight #1 Vicryl sutures.  2-0 Vicryl used with subcu layer and intracuticular 3 Monocryl for the skin followed by Dermabond and Aquasol dressing.  Left arm was placed into a sling.  Patient was awakened, extubated, and taken recovery in stable addition.  Jason Loges, PA-C was used as an Environmental consultant throughout this case essential for help with positioning of the patient, positioning extremity, tissue manipulation, implantation of the prosthesis, wound closure, and intraoperative decision making.  Jason Day  Contact # [phone]

## 2018-08-21 NOTE — Anesthesia Postprocedure Evaluation (Signed)
Anesthesia Post Note  Patient: Jason Day  Procedure(s) Performed: REVERSE SHOULDER ARTHROPLASTY (Left )     Anesthesia Post Evaluation  Last Vitals:  Vitals:   08/21/18 1130 08/21/18 1200  BP: 118/81 110/89  Pulse: 68 68  Resp: 19 16  Temp: (!) 36.4 C 36.6 C  SpO2: 100% 100%    Last Pain:  Vitals:   08/21/18 1200  TempSrc: Oral  PainSc: 0-No pain                 Mykelle Cockerell S

## 2018-08-21 NOTE — Anesthesia Procedure Notes (Signed)
Anesthesia Regional Block: Interscalene brachial plexus block   Pre-Anesthetic Checklist: ,, timeout performed, Correct Patient, Correct Site, Correct Laterality, Correct Procedure, Correct Position, site marked, Risks and benefits discussed,  Surgical consent,  Pre-op evaluation,  At surgeon's request and post-op pain management  Laterality: Left  Prep: chloraprep       Needles:  Injection technique: Single-shot  Needle Type: Echogenic Stimulator Needle     Needle Length: 5cm  Needle Gauge: 22     Additional Needles:   Procedures:, nerve stimulator,,,,,,,   Nerve Stimulator or Paresthesia:  Response: biceps flexion, 0.45 mA,   Additional Responses:   Narrative:  Start time: 08/21/2018 7:01 AM End time: 08/21/2018 7:07 AM Injection made incrementally with aspirations every 5 mL.  Performed by: Personally  Anesthesiologist: Albertha Ghee, MD  Additional Notes: Functioning IV was confirmed and monitors were applied.  A 43mm 22ga Arrow echogenic stimulator needle was used. Sterile prep and drape,hand hygiene and sterile gloves were used.  Negative aspiration and negative test dose prior to incremental administration of local anesthetic. The patient tolerated the procedure well.  Ultrasound guidance: relevent anatomy identified, needle position confirmed, local anesthetic spread visualized around nerve(s), vascular puncture avoided.  Image printed for medical record.

## 2018-08-21 NOTE — Transfer of Care (Signed)
Immediate Anesthesia Transfer of Care Note  Patient: Vick Filter  Procedure(s) Performed: REVERSE SHOULDER ARTHROPLASTY (Left )  Patient Location: PACU  Anesthesia Type:General  Level of Consciousness: drowsy, patient cooperative and responds to stimulation  Airway & Oxygen Therapy: Patient Spontanous Breathing and Patient connected to face mask oxygen  Post-op Assessment: Report given to RN and Post -op Vital signs reviewed and stable  Post vital signs: Reviewed and stable  Last Vitals:  Vitals Value Taken Time  BP 129/84 08/21/18 0930  Temp    Pulse 66 08/21/18 0932  Resp 17 08/21/18 0932  SpO2 100 % 08/21/18 0932  Vitals shown include unvalidated device data.  Last Pain:  Vitals:   08/21/18 0559  TempSrc:   PainSc: 0-No pain         Complications: No apparent anesthesia complications

## 2018-08-21 NOTE — Plan of Care (Signed)
Plan of care for day of surgery discussed with patient

## 2018-08-21 NOTE — Discharge Instructions (Signed)
° °Kevin M. Supple, M.D., F.A.A.O.S. °Orthopaedic Surgery °Specializing in Arthroscopic and Reconstructive °Surgery of the Shoulder °[phone] °[address] - [place], [place] [address] - Fax [phone] ° ° °POST-OP TOTAL SHOULDER REPLACEMENT INSTRUCTIONS ° °1. Call the office at [phone] to schedule your first post-op appointment 10-14 days from the date of your surgery. ° °2. The bandage over your incision is waterproof. You may begin showering with this dressing on. You may leave this dressing on until first follow up appointment within 2 weeks. We prefer you leave this dressing in place until follow up however after 5-7 days if you are having itching or skin irritation and would like to remove it you may do so. Go slow and tug at the borders gently to break the bond the dressing has with the skin. At this point if there is no drainage it is okay to go without a bandage or you may cover it with a light guaze and tape. You can also expect significant bruising around your shoulder that will drift down your arm and into your chest wall. This is very normal and should resolve over several days. ° ° 3. Wear your sling/immobilizer at all times except to perform the exercises below or to occasionally let your arm dangle by your side to stretch your elbow. You also need to sleep in your sling immobilizer until instructed otherwise. It is ok to remove your sling if you are sitting in a controlled environment and allow your arm to rest in a position of comfort by your side or on your lap with pillows to give your neck and skin a break from the sling. You may remove it to allow arm to dangle by side to shower. If you are up walking around and when you go to sleep at night you need to wear it. ° °4. Range of motion to your elbow, wrist, and hand are encouraged 3-5 times daily. Exercise to your hand and fingers helps to reduce swelling you may experience. ° °5. Utilize ice to the shoulder 3-5 times  minimum a day and additionally if you are experiencing pain. ° °6. Prescriptions for a pain medication and a muscle relaxant are provided for you. It is recommended that if you are experiencing pain that you pain medication alone is not controlling, add the muscle relaxant along with the pain medication which can give additional pain relief. The first 1-2 days is generally the most severe of your pain and then should gradually decrease. As your pain lessens it is recommended that you decrease your use of the pain medications to an "as needed basis'" only and to always comply with the recommended dosages of the pain medications. ° °7. Pain medications can produce constipation along with their use. If you experience this, the use of an over the counter stool softener or laxative daily is recommended.  ° °8. For additional questions or concerns, please do not hesitate to call the office. If after hours there is an answering service to forward your concerns to the physician on call. ° °9.Pain control following an exparel block ° °To help control your post-operative pain you received a nerve block  performed with Exparel which is a long acting anesthetic (numbing agent) which can provide pain relief and sensations of numbness (and relief of pain) in the operative shoulder and arm for up to 3 days. Sometimes it provides mixed relief, meaning you may still have numbness in certain areas of the arm but can still   be able to move  parts of that arm, hand, and fingers. We recommend that your prescribed pain medications  be used as needed. We do not feel it is necessary to "pre medicate" and "stay ahead" of pain.  Taking narcotic pain medications when you are not having any pain can lead to unnecessary and potentially dangerous side effects.    10. Use the ice machine as much as possible in the first 5-7 days from surgery, then you can wean its use to as needed. The ice typically needs to be replaced every 6 hours, instead of  ice you can actually freeze water bottles to put in the cooler and then fill water around them to avoid having to purchase ice. You can have spare water bottles freezing to allow you to rotate them once they have melted. Try to have a thin shirt or light cloth or towel under the ice wrap to protect your skin.  POST-OP EXERCISES  Pendulum Exercises  Perform pendulum exercises while standing and bending at the waist. Support your uninvolved arm on a table or chair and allow your operated arm to hang freely. Make sure to do these exercises passively - not using you shoulder muscles.  Repeat 20 times. Do 3 sessions per day.

## 2018-08-22 NOTE — Progress Notes (Signed)
pateint out of room in hall way asking about discharge informed patient that doctor is still seeing patients and paperwork is not ready yet. Ask patient to stay in chair and call for assisstance. Patient refused meds said he would take him when he got home. D Mateo Flow

## 2018-08-22 NOTE — Evaluation (Signed)
Occupational Therapy Evaluation Patient Details Name: Jason Day MRN: [id number] DOB: 09-May-1940 Today's Date: 08/22/2018    History of Present Illness reverse total shoulder arthroplasty-left   Clinical Impression   OT education complete. Handout provided.    Pt return demonstrated pendulums, lap slides, elbow, wrist and hand ROM             Precautions / Restrictions Precautions Precautions: Shoulder Type of Shoulder Precautions: OT eval and treat If sitting in controlled environment, ok to come out of sling to give neck a break. Please sleep in it to protect until follow up in office. OK to use operative arm for feeding, hygiene and ADLs. Ok to instruct Pendulums and lap slides Shoulder Interventions: Shoulder sling/immobilizer Restrictions Weight Bearing Restrictions: Yes      Mobility Bed Mobility Overal bed mobility: Independent                Transfers Overall transfer level: Independent                        ADL either performed or assessed with clinical judgement                      Pertinent Vitals/Pain Pain Assessment: 0-10 Pain Score: 2  Pain Descriptors / Indicators: Discomfort Pain Intervention(s): Limited activity within patient's tolerance;Repositioned     Hand Dominance     Extremity/Trunk Assessment Upper Extremity Assessment Upper Extremity Assessment: LUE deficits/detail           Communication Communication Communication: No difficulties   Cognition Arousal/Alertness: Awake/alert Behavior During Therapy: WFL for tasks assessed/performed Overall Cognitive Status: Within Functional Limits for tasks assessed                                           Shoulder Instructions Shoulder Instructions Donning/doffing shirt without moving shoulder: Minimal assistance;Patient able to independently direct caregiver Method for sponge bathing under operated UE: Minimal assistance;Patient able to independently  direct caregiver Donning/doffing sling/immobilizer: Minimal assistance;Patient able to independently direct caregiver Correct positioning of sling/immobilizer: Minimal assistance;Patient able to independently direct caregiver Pendulum exercises (written home exercise program): Minimal assistance;Patient able to independently direct caregiver ROM for elbow, wrist and digits of operated UE: Minimal assistance;Patient able to independently direct caregiver Sling wearing schedule (on at all times/off for ADL's): Minimal assistance;Patient able to independently direct caregiver Proper positioning of operated UE when showering: Minimal assistance;Patient able to independently direct caregiver Positioning of UE while sleeping: Minimal assistance;Patient able to independently direct caregiver    Home Living Family/patient expects to be discharged to:: Private residence Living Arrangements: Spouse/significant other Available Help at Discharge: Family                         Home Equipment: None          Prior Functioning/Environment Level of Independence: Independent                       OT Treatment/Interventions: Self-care/ADL training;Patient/family education    OT Goals(Current goals can be found in the care plan section) Acute Rehab OT Goals Patient Stated Goal: home today OT Goal Formulation: With patient  OT Frequency:      End of Session Nurse Communication: Mobility status  Activity Tolerance: Patient tolerated treatment well Patient left: in chair  Time: 8548-8301 OT Time Calculation (min): 22 min Charges:  OT General Charges $OT Visit: 1 Visit OT Evaluation $OT Eval Low Complexity: 1 Low  Kari Baars, OT Acute Rehabilitation Services [phone] Office- [phone], Edwena Felty D 08/22/2018, 12:37 PM

## 2018-08-22 NOTE — Plan of Care (Signed)
Plan of care 

## 2018-08-22 NOTE — Progress Notes (Signed)
Patient at the elavotor refusing to listen to discharge instructions. Iv taken out at [employer] papers printed and handed to patient [place]

## 2018-08-22 NOTE — Progress Notes (Signed)
Patient unwilling to stay in [place] nd call for assistance, up at the desk talking to Dr Stann Mainland and staff. Asking about discharge Dr Stann Mainland ask patientt to leave the desk area. D Rosine Abe

## 2018-08-22 NOTE — Progress Notes (Signed)
Discharge papers printed and given to patient at elevator,again refuses to listen to instructions. Floor number written of instructions if he or wife have questions, patient refuses to leave in a wheelchair, patient states" Im not having my wife wait on you are or anybody you lied to me about when I would get to leave. Again restated that I was waiting on MD to finish paperwork and was tied up with other patients. Escorted patient down to front door wife had not arrived. Waited 5 minutes, then called the floor and ask NS/NT to come down and stay with patient so RN could return to floor. NS/NT arrived back on floor at 1110, wait time for patients family to come 24minutes. Bethann Punches RN

## 2018-08-22 NOTE — Progress Notes (Signed)
   Subjective:  Patient reports pain as mild.  Doing very well with no complaints.  Ready to go home.  Objective:   VITALS:   Vitals:   08/21/18 1722 08/21/18 2152 08/22/18 0016 08/22/18 0425  BP: 130/68 117/76 123/72 128/81  Pulse: 77 61 64 80  Resp: 18 16 16 16   Temp: 98.2 F (36.8 C) 98.2 F (36.8 C) 98.5 F (36.9 C) 98.2 F (36.8 C)  TempSrc: Oral Oral Oral   SpO2: 97% 98% 100% 100%  Weight:      Height:        Neurologically intact Neurovascular intact Sensation intact distally Intact pulses distally Incision: dressing C/D/I   Lab Results  Component Value Date   WBC 4.1 08/18/2018   HGB 13.5 08/18/2018   HCT 42.6 08/18/2018   MCV 96.6 08/18/2018   PLT 102 (L) 08/18/2018   BMET    Component Value Date/Time   NA 143 08/18/2018 1101   NA 142 07/28/2018 0938   K 3.9 08/18/2018 1101   CL 110 08/18/2018 1101   CO2 28 08/18/2018 1101   GLUCOSE 103 (H) 08/18/2018 1101   BUN 18 08/18/2018 1101   BUN 15 07/28/2018 0938   CREATININE 1.17 08/18/2018 1101   CALCIUM 8.5 (L) 08/18/2018 1101   GFRNONAA 60 (L) 08/18/2018 1101   GFRAA >60 08/18/2018 1101     Assessment/Plan: 1 Day Post-Op   Active Problems:   S/P reverse total shoulder arthroplasty, left   Up with therapy - sling and NWB  - dc home today -follow up with Dr. Onnie Graham in 2 weeks.   Nicholes Stairs 08/22/2018, 9:33 AM   Geralynn Rile, MD [phone]

## 2018-08-22 NOTE — Discharge Summary (Signed)
Patient ID: Jason Day MRN: [id number] DOB/AGE: [birth year] 78 y.o.  Admit date: 08/21/2018 Discharge date: 08/22/2018  Primary Diagnosis: left rotator cuff arthropathy   Admission Diagnoses:  Past Medical History:  Diagnosis Date  . Atrial fibrillation ([place])   . Bleeding hemorrhoids   . Chronic combined systolic and diastolic heart failure ([place])   . Chronic sinus complaints    overuses afrin  . Coronary atherosclerosis of native coronary artery   . Hyperlipidemia   . Myocardial infarction [facility]) 09/2015   Discharge Diagnoses:   Active Problems:   S/P reverse total shoulder arthroplasty, left  Estimated body mass index is 25.29 kg/m as calculated from the following:   Height as of this encounter: 6' (1.829 m).   Weight as of this encounter: 84.6 kg.  Procedure:  Procedure(s) (LRB): REVERSE SHOULDER ARTHROPLASTY (Left)   Consults: None  HPI: Jason Day was admitted for elective left shoulder reverse arthroplasty for failure of conservative management of his left shoulder rotator cuff arthropathy.    Laboratory Data: Hospital Outpatient Visit on 08/18/2018  Component Date Value Ref Range Status  . SARS Coronavirus 2 08/18/2018 NEGATIVE  NEGATIVE Final   Comment: (NOTE) SARS-CoV-2 target nucleic acids are NOT DETECTED. The SARS-CoV-2 RNA is generally detectable in upper and lower respiratory specimens during the acute phase of infection. Negative results do not preclude SARS-CoV-2 infection, do not rule out co-infections with other pathogens, and should not be used as the sole basis for treatment or other patient management decisions. Negative results must be combined with clinical observations, patient history, and epidemiological information. The expected result is Negative. Fact Sheet for Patients: SugarRoll.be Fact Sheet for Healthcare Providers: https://www.woods-mathews.com/ This test is not yet approved or cleared by the [place] FDA and  has been authorized for detection and/or diagnosis of SARS-CoV-2 by FDA under an Emergency Use Authorization (EUA). This EUA will remain  in effect (meaning this test can be used) for the duration of the COVID-19 declaration under Section 56                          4(b)(1) of the Act, 21 U.S.C. section 360bbb-3(b)(1), unless the authorization is terminated or revoked sooner. Performed at [facility], [place] [address] [place], [place] [address]   Hospital Outpatient Visit on 08/18/2018  Component Date Value Ref Range Status  . WBC 08/18/2018 4.1  4.0 - 10.5 K/uL Final  . RBC 08/18/2018 4.41  4.22 - 5.81 MIL/uL Final  . Hemoglobin 08/18/2018 13.5  13.0 - 17.0 g/dL Final  . HCT 08/18/2018 42.6  39.0 - 52.0 % Final  . MCV 08/18/2018 96.6  80.0 - 100.0 fL Final  . MCH 08/18/2018 30.6  26.0 - 34.0 pg Final  . MCHC 08/18/2018 31.7  30.0 - 36.0 g/dL Final  . RDW 08/18/2018 15.0  11.5 - 15.5 % Final  . Platelets 08/18/2018 102* 150 - 400 K/uL Final   Comment: REPEATED TO VERIFY PLATELET COUNT CONFIRMED BY SMEAR SPECIMEN CHECKED FOR CLOTS Immature Platelet Fraction may be clinically indicated, consider ordering this additional test [id number]   . nRBC 08/18/2018 0.0  0.0 - 0.2 % Final   Performed at [facility] [address] [place],  [address]  . Sodium 08/18/2018 143  135 - 145 mmol/L Final  . Potassium 08/18/2018 3.9  3.5 - 5.1 mmol/L Final  . Chloride 08/18/2018 110  98 - 111 mmol/L Final  . CO2  08/18/2018 28  22 - 32 mmol/L Final  . Glucose, Bld 08/18/2018 103* 70 - 99 mg/dL Final  . BUN 08/18/2018 18  8 - 23 mg/dL Final  . Creatinine, Ser 08/18/2018 1.17  0.61 - 1.24 mg/dL Final  . Calcium 08/18/2018 8.5* 8.9 - 10.3 mg/dL Final  . GFR calc non Af Amer 08/18/2018 60* >60 mL/min Final  . GFR calc Af Amer 08/18/2018 >60  >60 mL/min Final  . Anion gap 08/18/2018 5  5 - 15 Final   Performed at [facility], [place]  [address] [place], [place] [address]  . MRSA, PCR 08/18/2018 NEGATIVE  NEGATIVE Final  . Staphylococcus aureus 08/18/2018 NEGATIVE  NEGATIVE Final   Comment: (NOTE) The Xpert SA Assay (FDA approved for NASAL specimens in patients 9 years of age and older), is one component of a comprehensive surveillance program. It is not intended to diagnose infection nor to guide or monitor treatment. Performed at [facility], [place] [address] [place], [place] [address]   Lab on 07/28/2018  Component Date Value Ref Range Status  . Cholesterol, Total 07/28/2018 85* 100 - 199 mg/dL Final  . Triglycerides 07/28/2018 51  0 - 149 mg/dL Final  . HDL 07/28/2018 27* >39 mg/dL Final  . VLDL Cholesterol Cal 07/28/2018 10  5 - 40 mg/dL Final  . LDL Calculated 07/28/2018 48  0 - 99 mg/dL Final  . Chol/HDL Ratio 07/28/2018 3.1  0.0 - 5.0 ratio Final   Comment:                                   T. Chol/HDL Ratio                                             Men  Women                               1/2 Avg.Risk  3.4    3.3                                   Avg.Risk  5.0    4.4                                2X Avg.Risk  9.6    7.1                                3X Avg.Risk 23.4   11.0   . TSH 07/28/2018 0.675  0.450 - 4.500 uIU/mL Final  . Glucose 07/28/2018 109* 65 - 99 mg/dL Final  . BUN 07/28/2018 15  8 - 27 mg/dL Final  . Creatinine, Ser 07/28/2018 1.21  0.76 - 1.27 mg/dL Final  . GFR calc non Af Amer 07/28/2018 57* >59 mL/min/1.73 Final  . GFR calc Af Amer 07/28/2018 66  >59 mL/min/1.73 Final  . BUN/Creatinine Ratio 07/28/2018 12  10 - 24 Final  . Sodium 07/28/2018 142  134 - 144 mmol/L Final  . Potassium 07/28/2018 4.4  3.5 - 5.2 mmol/L Final  .  Chloride 07/28/2018 102  96 - 106 mmol/L Final  . CO2 07/28/2018 26  20 - 29 mmol/L Final  . Calcium 07/28/2018 9.0  8.6 - 10.2 mg/dL Final  . Total Protein 07/28/2018 5.9* 6.0 - 8.5 g/dL Final  . Albumin 07/28/2018 3.7  3.7 - 4.7 g/dL Final   . Globulin, Total 07/28/2018 2.2  1.5 - 4.5 g/dL Final  . Albumin/Globulin Ratio 07/28/2018 1.7  1.2 - 2.2 Final  . Bilirubin Total 07/28/2018 1.6* 0.0 - 1.2 mg/dL Final  . Alkaline Phosphatase 07/28/2018 123* 39 - 117 IU/L Final  . AST 07/28/2018 18  0 - 40 IU/L Final  . ALT 07/28/2018 17  0 - 44 IU/L Final  Anti-coag visit on 07/28/2018  Component Date Value Ref Range Status  . INR 07/28/2018 1.8* 2.0 - 3.0 Final  Anti-coag visit on 07/07/2018  Component Date Value Ref Range Status  . INR 07/07/2018 1.8* 2.0 - 3.0 Final     X-Rays:No results found.  EKG: Orders placed or performed during the hospital encounter of 08/21/18  . EKG 12-Lead  . EKG 12-Lead     Hospital Course: Jason Day is a 78 y.o. who was admitted to Hospital. They were brought to the operating room on 08/21/2018 and underwent Procedure(s): [place].  Patient tolerated the procedure well and was later transferred to the recovery room and then to the orthopaedic floor for postoperative care.  They were given PO and IV analgesics for pain control following their surgery.  They were given 24 hours of postoperative antibiotics of  Anti-infectives (From admission, onward)   Start     Dose/Rate Route Frequency Ordered Stop   08/21/18 0600  ceFAZolin (ANCEF) IVPB 2g/100 mL premix     2 g 200 mL/hr over 30 Minutes Intravenous On call to O.R. 08/21/18 0532 08/21/18 0818     and started on DVT prophylaxis in the form of None.   OT was ordered for total joint protocol.    Patient had a good night on the evening of surgery.  They started to get up OOB with therapy on day one. By day one, the patient had progressed with therapy and meeting their goals.  Incision was healing well.  Patient was seen in rounds and was ready to go home.   Diet: Regular diet Activity:NWB Follow-up:in 2 weeks Disposition - Home Discharged Condition: good   Discharge Instructions    Call MD / Call 911   Complete by: As directed     If you experience chest pain or shortness of breath, CALL 911 and be transported to the hospital emergency room.  If you develope a fever above 101 F, pus (white drainage) or increased drainage or redness at the wound, or calf pain, call your surgeon's office.   Constipation Prevention   Complete by: As directed    Drink plenty of fluids.  Prune juice may be helpful.  You may use a stool softener, such as Colace (over the counter) 100 mg twice a day.  Use MiraLax (over the counter) for constipation as needed.   Diet - low sodium heart healthy   Complete by: As directed    Increase activity slowly as tolerated   Complete by: As directed      Allergies as of 08/22/2018   No Known Allergies     Medication List    TAKE these medications   amiodarone 200 MG tablet Commonly known as: PACERONE Take 1 tablet (200 mg total) by  mouth daily.   aspirin EC 81 MG tablet Take 81 mg by mouth daily.   atorvastatin 20 MG tablet Commonly known as: LIPITOR Take 0.5 tablets (10 mg total) by mouth daily. What changed: when to take this   digoxin 0.125 MG tablet Commonly known as: LANOXIN Take 0.5 tablets (62.5 mcg total) by mouth daily.   methocarbamol 500 MG tablet Commonly known as: Robaxin Take 1 tablet (500 mg total) by mouth 3 (three) times daily as needed for muscle spasms.   metoprolol succinate 25 MG 24 hr tablet Commonly known as: TOPROL-XL Take 0.5 tablets (12.5 mg total) by mouth daily. What changed: when to take this   ondansetron 4 MG tablet Commonly known as: Zofran Take 1 tablet (4 mg total) by mouth every 8 (eight) hours as needed for nausea or vomiting.   oxyCODONE-acetaminophen 5-325 MG tablet Commonly known as: Percocet Take 1 tablet by mouth every 4 (four) hours as needed (max 6 q).   oxymetazoline 0.05 % nasal spray Commonly known as: AFRIN Place 2 sprays into both nostrils at bedtime as needed for congestion.   pantoprazole 40 MG tablet Commonly known as:  PROTONIX Take 1 tablet (40 mg total) by mouth daily.   sacubitril-valsartan 49-51 MG Commonly known as: Entresto Take 1 tablet by mouth 2 (two) times daily.   spironolactone 25 MG tablet Commonly known as: ALDACTONE Take 0.5 tablets (12.5 mg total) by mouth daily. What changed: when to take this   warfarin 5 MG tablet Commonly known as: COUMADIN Take as directed. If you are unsure how to take this medication, talk to your nurse or doctor. Original instructions: Take as directed by [facility] What changed:   how much to take  how to take this  when to take this      Follow-up Information    Justice Britain, MD.   Specialty: Orthopedic Surgery Why: call to be seen in 10-14 days Contact information: [address] [place] [address] [phone]           Signed: Geralynn Rile, MD Orthopaedic Surgery 08/22/2018, 3:25 PM

## 2018-08-26 ENCOUNTER — Telehealth: Payer: Self-pay | Admitting: Cardiovascular Disease

## 2018-08-26 NOTE — Telephone Encounter (Signed)
The patient stated for the past 2 weeks he has had leg swelling. He also has slight left arm swelling, but he was more concerned with the legs since he just had surgery on his left shoulder. He stated his left is worse then the right. He denies SOB. He said he has lasix 20 mg in the past and took a pill last night. He said he did not notice any improvement after taking the lasix. He denies any lifestyle changes before his swelling started.

## 2018-08-26 NOTE — Telephone Encounter (Signed)
  Jason Day is having swelling in his left arm and left leg. Swelling has been over the last two weeks. He does take a fluid pill. Please advise

## 2018-08-27 ENCOUNTER — Encounter (HOSPITAL_COMMUNITY): Payer: Self-pay | Admitting: Orthopedic Surgery

## 2018-08-28 NOTE — Telephone Encounter (Signed)
Patient does t take meds the way he is supposed to should be in aldactone and entresto but has refused have him see DOD this week and check BMET BNP and get on better CHF regimen can also be seen in CHF clinic this week if easier

## 2018-08-28 NOTE — Telephone Encounter (Signed)
Spoke with pt and was refusing an APP appt only wanting to see Dr Johnsie Cancel. Pt aware no availability until SEPT was able to enourage pt to see an APP Appt made for Tuesday 09/02/18 at 10:30 am with [facility] PA

## 2018-08-28 NOTE — Telephone Encounter (Signed)
Lm to call back ./cy 

## 2018-09-01 ENCOUNTER — Telehealth: Payer: Self-pay | Admitting: Physician Assistant

## 2018-09-01 NOTE — Progress Notes (Signed)
Cardiology Office Note    Date:  09/02/2018   ID:  [name], DOB 02/25/1940, MRN [id number]  PCP:  Lucille Passy, MD  Cardiologist:  Dr. Johnsie Cancel   Chief Complaint: LE swelling   History of Present Illness:   Jason Day is a 78 y.o. male CAD s/p CABG, ICM, PAF, moderate MR and HLD seen for LE edema.    He initially presented 09/2016 w/ acute pulmonary edema and diuresed. Echo showed reduced LVEF, down to 30%, leading to LHC, which showed severe multivessel CAD. 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 Post of afib >> on amiodarone and coumadin. He is claustrophobic and cannot have MRI.   Last echo 04/2018 showed LVEF of 30-35%, grade 2 DD,  WM abnormality with mural thrombus.   Last seen by Dr. Johnsie Cancel 06/26/2018. Started on [place]. Plan to get echo in 6 months.   The patient had L rotator cuff arthropathy 08/21/18.   Today here for follow up. He is not taking amiodarone for long period of time. Patient has LE swelling L >R  for 3-4 weeks (before the surgery). Gained 15 lb in past 4 weeks. No energy since then, mostly starts in afternoon.  Denies palpitation, shortness of breath, chest pain, dizziness, orthopnea, PND or syncope.  He has abdominal fullness/tightness.  Compliant with low-sodium diet.  Seeing GI for diarrhea.  He took Lasix for past 2 days with minimal improvement in edema.  Past Medical History:  Diagnosis Date  . Atrial fibrillation ([place])   . Bleeding hemorrhoids   . Chronic combined systolic and diastolic heart failure ([place])   . Chronic sinus complaints    overuses afrin  . Coronary atherosclerosis of native coronary artery   . Hyperlipidemia   . Myocardial infarction [facility]) 09/2015    Past Surgical History:  Procedure Laterality Date  . BACK SURGERY  ~2014   disc repair  . CHOLECYSTECTOMY    . COLONOSCOPY  04/06/2014   Hemorrhoids and diverticulosis performed in [place]  . CORONARY ARTERY BYPASS GRAFT N/A 10/18/2016   Procedure: CORONARY ARTERY BYPASS GRAFTING (CABG) x five ,  using left internal mammary artery and right leg greater saphenous vein harvested endoscopically;  Surgeon: Gaye Pollack, MD;  Location: [facility] OR;  Service: Open Heart Surgery;  Laterality: N/A;  . FOOT SURGERY    . HEMORRHOID BANDING    . HEMORRHOID SURGERY    . REVERSE SHOULDER ARTHROPLASTY Left 08/21/2018   Procedure: REVERSE SHOULDER ARTHROPLASTY;  Surgeon: Justice Britain, MD;  Location: [facility];  Service: Orthopedics;  Laterality: Left;  . RIGHT/LEFT HEART CATH AND CORONARY ANGIOGRAPHY N/A 10/16/2016   Procedure: RIGHT/LEFT HEART CATH AND CORONARY ANGIOGRAPHY;  Surgeon: Belva Crome, MD;  Location: [facility];  Service: Cardiovascular;  Laterality: N/A;  . stents in liver    . TEE WITHOUT CARDIOVERSION N/A 10/18/2016   Procedure: TRANSESOPHAGEAL ECHOCARDIOGRAM (TEE);  Surgeon: Gaye Pollack, MD;  Location: [facility];  Service: Open Heart Surgery;  Laterality: N/A;    Current Medications:  Prior to Admission medications   Medication Sig Start Date End Date Taking? Authorizing Provider  atorvastatin (LIPITOR) 20 MG tablet Take 0.5 tablets (10 mg total) by mouth daily. Patient taking differently: Take 10 mg by mouth every evening.  07/29/18  Yes Josue Hector, MD  digoxin (LANOXIN) 0.125 MG tablet Take 0.5 tablets (62.5 mcg total) by mouth daily. 06/26/18  Yes Josue Hector, MD  metoprolol succinate (TOPROL-XL) 25 MG 24 hr tablet Take 0.5  tablets (12.5 mg total) by mouth daily. Patient taking differently: Take 12.5 mg by mouth at bedtime.  06/26/18  Yes Josue Hector, MD  sacubitril-valsartan (ENTRESTO) 49-51 MG Take 1 tablet by mouth 2 (two) times daily. 07/29/18  Yes Josue Hector, MD  spironolactone (ALDACTONE) 25 MG tablet Take 0.5 tablets (12.5 mg total) by mouth daily. Patient taking differently: Take 12.5 mg by mouth every evening.  06/26/18  Yes Josue Hector, MD  warfarin (COUMADIN) 5 MG tablet Take as directed by [facility] Patient taking differently: Take 5 mg by mouth  every evening. Take as directed by [facility] 06/26/18  Yes Josue Hector, MD   Allergies:   Patient has no known allergies.   Social History   Socioeconomic History  . Marital status: Married    Spouse name: Not on file  . Number of children: 2  . Years of education: Not on file  . Highest education level: Not on file  Occupational History  . 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 Financial resource strain: Not on file  . Food insecurity    Worry: Not on file    Inability: Not on file  . Transportation needs    Medical: Not on file    Non-medical: Not on file  Tobacco Use  . Smoking status: Former Smoker    Packs/day: 0.50    Types: Cigarettes    Quit date: 1970    Years since quitting: 50.5  . Smokeless tobacco: Never Used  . Tobacco comment: Smoked on and off  Substance and Sexual Activity  . Alcohol use: No  . Drug use: No  . Sexual activity: Yes  Lifestyle  . Physical activity    Days per week: Not on file    Minutes per session: Not on file  . Stress: Not on file  Relationships  . Social Herbalist on phone: Not on file    Gets together: Not on file    Attends religious service: Not on file    Active member of club or organization: Not on file    Attends meetings of clubs or organizations: Not on file    Relationship status: Not on file  Other Topics Concern  . Not on file  Social History Narrative   Married    Originally from [place] moved to [place] age 46 moved to [place]   1 son   1 estranged daughter      Has living will   Wife is health care POA--then son   Would accept resuscitation   No tube feeds if cognitively unaware     Family History:  The patient's family history includes COPD in his father; Emphysema in his father; Healthy in his brother.   ROS:   Please see the history of present illness.    ROS All other systems reviewed and are negative.   PHYSICAL EXAM:   VS:   BP 126/68   Pulse 88   Ht 6' (1.829 m)   Wt 190 lb (86.2 kg)   BMI 25.77 kg/m    GEN: Well nourished, well developed, in no acute distress  HEENT: normal  Neck: no JVD, carotid bruits, or masses Cardiac: Irregularly irregular, positive systolic heart murmur;  rubs, or gallops, 2+ edema left lower extremity, 1+ edema right lower extremity Respiratory:  clear to auscultation bilaterally, normal work of breathing GI: soft, nontender,+ distended, + BS MS: no  deformity or atrophy  Skin: warm and dry, no rash Neuro:  Alert and Oriented x 3, Strength and sensation are intact Psych: euthymic mood, full affect  Wt Readings from Last 3 Encounters:  09/02/18 190 lb (86.2 kg)  08/21/18 186 lb 8 oz (84.6 kg)  08/18/18 186 lb 8 oz (84.6 kg)      Studies/Labs Reviewed:   EKG:  EKG is not ordered today.  The ekg 08/21/2018  Demonstrated atrial fibrillation at rate of 57 bpm, new nonspecific T wave/ST changes in lateral leads.  Recent Labs: 07/28/2018: ALT 17; TSH 0.675 08/18/2018: BUN 18; Creatinine, Ser 1.17; Hemoglobin 13.5; Platelets 102; Potassium 3.9; Sodium 143   Lipid Panel    Component Value Date/Time   CHOL 85 (L) 07/28/2018 0938   TRIG 51 07/28/2018 0938   HDL 27 (L) 07/28/2018 0938   CHOLHDL 3.1 07/28/2018 0938   CHOLHDL 6 04/22/2017 1431   VLDL 9 10/15/2016 1155   LDLCALC 48 07/28/2018 0938   LDLDIRECT 119.0 04/22/2017 1431    Additional studies/ records that were reviewed today include:   Echocardiogram: 04/2018  1. The left ventricle has moderate-severely reduced systolic function, with an ejection fraction of 30-35%. The cavity size was mildly dilated. There is moderately increased left ventricular wall thickness. Left ventricular diastolic Doppler parameters  are consistent with pseudonormalization. Basal inferoseptal akinesis, basal inferior and basal inferolateral aneurysm. Mid inferior and mid inferolateral akinesis. There appeared to be a laminated mural thrombus  within the basal inferior/basal  inferolateral aneurysm.  2. Left atrial size was moderately dilated.  3. Right atrial size was mildly dilated.  4. There is mild mitral annular calcification present. Mitral valve regurgitation is moderate by color flow Doppler. Suspect infarct-related MR in the setting of inferior/inferolateral wall motion abnormalities. No evidence of mitral valve stenosis.  5. The aortic valve is tricuspid Mild calcification of the aortic valve. no stenosis of the aortic valve.  6. There is mild dilatation of the ascending aorta measuring 40 mm.  7. The IVC was normal in size. PA systolic pressure 39 mmHg.    ASSESSMENT & PLAN:    1. Acute on chronic combined CHF  - LVEF of 30-35% by last echo 04/2018>>> started on entresto 06/2018. -Presented with 4-week history of lower extremity edema and weight gain.  Minimally improved with Lasix for past 2 days.  He is compliant with his low-sodium diet.  Baseline weight 172-175lb. Weight 190lb today.  Evidence of volume overload by exam.  Acute exacerbation differentials are atrial fibrillation, worsening mitral regurgitation or CAD. -Check labs today.  Increase Lasix to 40 mg daily for 5 days then as needed.  Advised to monitor symptoms closely. -Continue current medical therapy of Toprol XL, Digoxin, Spironolactone and Entresto. -We will plan to increase beta-blocker and/or Spironolactone during next office visit.   2. ICM -As above.  Plan to repeat echocardiogram in few months after maximal medical therapy.  3. CAD s/p CABG -Recent EKG with nonspecific changes.  Patient denies any anginal symptoms.  Not on aspirin due to need of anticoagulation.  Continue beta-blocker and statin.  4. PAF -Not on amiodarone for unknown period of time.  He was noted in atrial fibrillation by EKG during his surgery 2 weeks ago.  He is in atrial fibrillation by exam today.  Likely this is contributing to his acute CHF.  Rate well controlled.   Patient denies any palpitation or bleeding issue. -Continue Coumadin and BB.  - Add spironolactone.   5.  Moderate mitral regurgitation -Continue to follow.  Will schedule echocardiogram in few months.  Medication Adjustments/Labs and Tests Ordered: Current medicines are reviewed at length with the patient today.  Concerns regarding medicines are outlined above.  Medication changes, Labs and Tests ordered today are listed in the Patient Instructions below. Patient Instructions  Medication Instructions:  Your physician has recommended you make the following change in your medication:  1.  START Lasix 40 mg taking 1 tablet daily for 5 days then take only as needed 2.  START Amiodarone 200 mg taking 1 tablet daily   If you need a refill on your cardiac medications before your next appointment, please call your pharmacy.   Lab work: TODAY:  BMET, CBC, & PRO BNP  If you have labs (blood work) drawn today and your tests are completely normal, you will receive your results only by: Marland Kitchen MyChart Message (if you have MyChart) OR . A paper copy in the mail If you have any lab test that is abnormal or we need to change your treatment, we will call you to review the results.  Testing/Procedures: None ordered  Follow-Up: At [facility], you and your health needs are our priority.  As part of our continuing mission to provide you with exceptional heart care, we have created designated Provider Care Teams.  These Care Teams include your primary Cardiologist (physician) and Advanced Practice Providers (APPs -  Physician Assistants and Nurse Practitioners) who all work together to provide you with the care you need, when you need it. Marland Kitchen You are scheduled for a in-office follow-up appointment with Robbie Lis, PA-C, 09/16/2018 at 3:45.  Please arrive 15 mins early to this appointment  Any Other Special Instructions Will Be Listed Below (If Applicable).       Jarrett Soho, [place]   09/02/2018 12:18 PM    [facility] [place], [place], [place]  [address] Phone: [phone]; Fax: [phone]

## 2018-09-01 NOTE — Telephone Encounter (Signed)

## 2018-09-02 ENCOUNTER — Other Ambulatory Visit: Payer: Self-pay

## 2018-09-02 ENCOUNTER — Ambulatory Visit (INDEPENDENT_AMBULATORY_CARE_PROVIDER_SITE_OTHER): Payer: Medicare Other | Admitting: Physician Assistant

## 2018-09-02 ENCOUNTER — Encounter: Payer: Self-pay | Admitting: Physician Assistant

## 2018-09-02 VITALS — BP 126/68 | HR 88 | Ht 72.0 in | Wt 190.0 lb

## 2018-09-02 DIAGNOSIS — I340 Nonrheumatic mitral (valve) insufficiency: Secondary | ICD-10-CM | POA: Diagnosis not present

## 2018-09-02 DIAGNOSIS — I255 Ischemic cardiomyopathy: Secondary | ICD-10-CM

## 2018-09-02 DIAGNOSIS — I480 Paroxysmal atrial fibrillation: Secondary | ICD-10-CM

## 2018-09-02 DIAGNOSIS — Z951 Presence of aortocoronary bypass graft: Secondary | ICD-10-CM

## 2018-09-02 DIAGNOSIS — R600 Localized edema: Secondary | ICD-10-CM | POA: Diagnosis not present

## 2018-09-02 DIAGNOSIS — I5043 Acute on chronic combined systolic (congestive) and diastolic (congestive) heart failure: Secondary | ICD-10-CM

## 2018-09-02 MED ORDER — FUROSEMIDE 40 MG PO TABS: ORAL_TABLET | ORAL | 11 refills | Status: DC

## 2018-09-02 MED ORDER — AMIODARONE HCL 200 MG PO TABS: 200.0000 mg | ORAL_TABLET | Freq: Every day | ORAL | 3 refills | Status: DC

## 2018-09-02 NOTE — Patient Instructions (Addendum)
Medication Instructions:  Your physician has recommended you make the following change in your medication:  1.  START Lasix 40 mg taking 1 tablet daily for 5 days then take only as needed 2.  START Amiodarone 200 mg taking 1 tablet daily   If you need a refill on your cardiac medications before your next appointment, please call your pharmacy.   Lab work: TODAY:  BMET, CBC, & PRO BNP  If you have labs (blood work) drawn today and your tests are completely normal, you will receive your results only by: Marland Kitchen MyChart Message (if you have MyChart) OR . A paper copy in the mail If you have any lab test that is abnormal or we need to change your treatment, we will call you to review the results.  Testing/Procedures: None ordered  Follow-Up: At [facility], you and your health needs are our priority.  As part of our continuing mission to provide you with exceptional heart care, we have created designated Provider Care Teams.  These Care Teams include your primary Cardiologist (physician) and Advanced Practice Providers (APPs -  Physician Assistants and Nurse Practitioners) who all work together to provide you with the care you need, when you need it. Marland Kitchen You are scheduled for a in-office follow-up appointment with Jason Lis, PA-C, 09/16/2018 at 3:45.  Please arrive 15 mins early to this appointment  Any Other Special Instructions Will Be Listed Below (If Applicable).

## 2018-09-03 LAB — BASIC METABOLIC PANEL
BUN/Creatinine Ratio: 10 (ref 10–24)
BUN: 11 mg/dL (ref 8–27)
CO2: 29 mmol/L (ref 20–29)
Calcium: 8.8 mg/dL (ref 8.6–10.2)
Chloride: 101 mmol/L (ref 96–106)
Creatinine, Ser: 1.12 mg/dL (ref 0.76–1.27)
GFR calc Af Amer: 73 mL/min/{1.73_m2} (ref >59)
GFR calc non Af Amer: 63 mL/min/{1.73_m2} (ref >59)
Glucose: 99 mg/dL (ref 65–99)
Potassium: 4.4 mmol/L (ref 3.5–5.2)
Sodium: 144 mmol/L (ref 134–144)

## 2018-09-03 LAB — CBC
Hematocrit: 40 % (ref 37.5–51.0)
Hemoglobin: 13.2 g/dL (ref 13.0–17.7)
MCH: 30.7 pg (ref 26.6–33.0)
MCHC: 33 g/dL (ref 31.5–35.7)
MCV: 93 fL (ref 79–97)
Platelets: 179 10*3/uL (ref 150–450)
RBC: 4.3 x10E6/uL (ref 4.14–5.80)
RDW: 13.3 % (ref 11.6–15.4)
WBC: 6.3 10*3/uL (ref 3.4–10.8)

## 2018-09-03 LAB — PRO B NATRIURETIC PEPTIDE: NT-Pro BNP: 5646 pg/mL — ABNORMAL HIGH (ref 0–486)

## 2018-09-05 DIAGNOSIS — Z96612 Presence of left artificial shoulder joint: Secondary | ICD-10-CM | POA: Diagnosis not present

## 2018-09-05 DIAGNOSIS — Z471 Aftercare following joint replacement surgery: Secondary | ICD-10-CM | POA: Diagnosis not present

## 2018-09-08 ENCOUNTER — Telehealth: Payer: Self-pay | Admitting: Physician Assistant

## 2018-09-08 NOTE — Telephone Encounter (Signed)
Patient is calling to let Jason Day know how is his doing on the new medication he put him on last week. START Lasix 40 mg taking 1 tablet daily for 5 days then take only as needed 2.  START Amiodarone 200 mg taking 1 tablet daily.  Patient states he lost 18lbs since starting the medication.

## 2018-09-08 NOTE — Telephone Encounter (Signed)
I spoke with pt. He is calling to give update after last visit with B. Bhagat, PA. Pt has taken furosemide for 5 days.  Last dose today. States he lost 12-13 lbs.  Weight today is 177 lbs.  He was 190 lbs when seen in office last week.   Pt reports swelling has improved a great deal but is not completely gone. He is aware of coumadin clinic appt on July 24 and appt with B.Bhagat, PA on July 28

## 2018-09-09 MED ORDER — SPIRONOLACTONE 25 MG PO TABS: 25.0000 mg | ORAL_TABLET | Freq: Every day | ORAL | 3 refills | Status: DC

## 2018-09-09 MED ORDER — FUROSEMIDE 40 MG PO TABS: 40.0000 mg | ORAL_TABLET | Freq: Every day | ORAL | 3 refills | Status: DC

## 2018-09-09 NOTE — Telephone Encounter (Signed)
Patient called [facility] clinic wanted to speak with Dr. Johnsie Cancel RN. States he would like to speak to [facility], although unsure if she is out on leave.

## 2018-09-09 NOTE — Telephone Encounter (Signed)
Left message on patient's and wife's cell phones for call back to review advice from Dr. Johnsie Cancel Updated patient's medication list to indicate Furosemide 40 mg daily and Spironolactone 25 mg daily

## 2018-09-09 NOTE — Telephone Encounter (Signed)
Spoke with patient who states he called to get information back from [place], [place] or Dr. Johnsie Cancel regarding his recent visit and weight loss on Lasix 40 mg daily.  Reports weight today 176 lb and swelling has greatly decreased. Denies SOB and states he feels well. He continues to take Lasix 40 mg daily and reports very good urine output.  He was very complimentary of his visit with Robbie Lis, PA but would like to see Dr. Johnsie Cancel in person at next visit. States he was told he could not see Dr. Johnsie Cancel until November and he does not want to wait that long. Asks when he will increase dose of Entresto and will he continue Lasix 40 mg daily until next visit. I advised that I will forward message to Dr. Johnsie Cancel and Ferol Luz for advice and that I will help get an appointment scheduled when deemed appropriate.  Patient verbalized understanding and agreement and thanked me for the call.

## 2018-09-09 NOTE — Telephone Encounter (Signed)
Increase aldactone to 25 mg daily and take lasix 20 mg bid F/U BMET in 2 weeks was ok a week ago

## 2018-09-09 NOTE — Telephone Encounter (Signed)
It looks like he has been on mid dose of entresto for at least 3 weeks can increase to 97/103 and keep lasix at 40 mg daily BMET/BNP in 2 weeks after changing can try to get on my schedule when I am in office and have opening or Vin or a visit with Pharm D for titrating [name]

## 2018-09-10 ENCOUNTER — Telehealth: Payer: Self-pay

## 2018-09-10 NOTE — Telephone Encounter (Signed)
Left message for patient with the medication changes and appointment with Dr. Johnsie Cancel on 9/30. Called and spoke with patient's wife and reviewed this information with her. She states her husband told her a different story about our conservation yesterday and said no one told him what to do about his leaking heart. She states he is showing signs of depression and she has been unclear as to the plan of care. States he thought that Dr. Johnsie Cancel was trying to give him to another doctor and thought that the PFT scheduled for 11/2 was a stress test and asked why were were waiting so long. I answered questions to wife's satisfaction and explained the reason for the medication changes, upcoming appointments, and patient's cardiac conditions. I advised her to please come with patient to appointment on 7/30 so that she will know plan of care. She verbalized understanding and agreement and thanked me for the call.

## 2018-09-10 NOTE — Telephone Encounter (Signed)

## 2018-09-12 ENCOUNTER — Other Ambulatory Visit: Payer: Self-pay

## 2018-09-12 ENCOUNTER — Ambulatory Visit (INDEPENDENT_AMBULATORY_CARE_PROVIDER_SITE_OTHER): Payer: Medicare Other | Admitting: *Deleted

## 2018-09-12 DIAGNOSIS — Z951 Presence of aortocoronary bypass graft: Secondary | ICD-10-CM | POA: Diagnosis not present

## 2018-09-12 DIAGNOSIS — Z5181 Encounter for therapeutic drug level monitoring: Secondary | ICD-10-CM | POA: Diagnosis not present

## 2018-09-12 LAB — POCT INR: INR: 1.7 — AB (ref 2.0–3.0)

## 2018-09-12 NOTE — Patient Instructions (Signed)
Description   Take 1.5 tablets today and then start taking 1 tablet daily except for 1.5 on Fridays. Recheck INR in 3 weeks. Coumadin Clinic#[phone] Main [phone].

## 2018-09-15 ENCOUNTER — Ambulatory Visit: Payer: Medicare Other | Admitting: Family Medicine

## 2018-09-15 NOTE — Progress Notes (Signed)
Cardiology Office Note    Date:  09/15/2018   ID:  Jason Day, DOB Jun 28, 1940, MRN [id number]  PCP:  No primary care provider on file.  Cardiologist:  Dr. Johnsie Cancel   Chief Complaint: Edema / CHF   History of Present Illness:   Jason Day is a 78 y.o. male CAD s/p CABG, ICM, PAF, moderate MR and HLD seen for LE edema.    He initially presented 09/2016 w/ acute pulmonary edema and diuresed. Echo showed reduced LVEF, down to 30%, leading to LHC, which showed severe multivessel CAD. 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 Post of afib >> on amiodarone and coumadin. He is claustrophobic and cannot have MRI.   Last echo 04/2018 showed LVEF of 30-35%, grade 2 DD,  WM abnormality with mural thrombus.   Last seen by me 06/26/2018. Started on [place]. b.   The patient had L rotator cuff arthropathy 08/21/18.   Seen by PA May with weight gain and edema. CHF meds titrated adding aldactone and increasing entresto  Wife indicates he is depressed he indicates he was frustrated that his heart function isn't any better  Currently his edema is from LE venous disease and not CHF  Past Medical History:  Diagnosis Date  . Atrial fibrillation ([place])   . Bleeding hemorrhoids   . Chronic combined systolic and diastolic heart failure ([place])   . Chronic sinus complaints    overuses afrin  . Coronary atherosclerosis of native coronary artery   . Hyperlipidemia   . Myocardial infarction [facility]) 09/2015    Past Surgical History:  Procedure Laterality Date  . BACK SURGERY  ~2014   disc repair  . CHOLECYSTECTOMY    . COLONOSCOPY  04/06/2014   Hemorrhoids and diverticulosis performed in [place]  . CORONARY ARTERY BYPASS GRAFT N/A 10/18/2016   Procedure: CORONARY ARTERY BYPASS GRAFTING (CABG) x five , using left internal mammary artery and right leg greater saphenous vein harvested endoscopically;  Surgeon: Gaye Pollack, MD;  Location: [place] OR;  Service: Open Heart Surgery;  Laterality: N/A;  . FOOT SURGERY    . HEMORRHOID  BANDING    . HEMORRHOID SURGERY    . REVERSE SHOULDER ARTHROPLASTY Left 08/21/2018   Procedure: REVERSE SHOULDER ARTHROPLASTY;  Surgeon: Justice Britain, MD;  Location: [facility] OR[facility];  Service: Orthopedics;  Laterality: Left;  . RIGHT/LEFT HEART CATH AND CORONARY ANGIOGRAPHY N/A 10/16/2016   Procedure: RIGHT/LEFT HEART CATH AND CORONARY ANGIOGRAPHY;  Surgeon: Belva Crome, MD;  Location: [place] CV LAB;  Service: Cardiovascular;  Laterality: N/A;  . stents in liver    . TEE WITHOUT CARDIOVERSION N/A 10/18/2016   Procedure: TRANSESOPHAGEAL ECHOCARDIOGRAM (TEE);  Surgeon: Gaye Pollack, MD;  Location: [facility];  Service: Open Heart Surgery;  Laterality: N/A;    Current Medications:  Prior to Admission medications   Medication Sig Start Date End Date Taking? Authorizing Provider  atorvastatin (LIPITOR) 20 MG tablet Take 0.5 tablets (10 mg total) by mouth daily. Patient taking differently: Take 10 mg by mouth every evening.  07/29/18  Yes Josue Hector, MD  digoxin (LANOXIN) 0.125 MG tablet Take 0.5 tablets (62.5 mcg total) by mouth daily. 06/26/18  Yes Josue Hector, MD  metoprolol succinate (TOPROL-XL) 25 MG 24 hr tablet Take 0.5 tablets (12.5 mg total) by mouth daily. Patient taking differently: Take 12.5 mg by mouth at bedtime.  06/26/18  Yes Josue Hector, MD  sacubitril-valsartan (ENTRESTO) 49-51 MG Take 1 tablet by mouth 2 (two) times daily. 07/29/18  Yes Josue Hector, MD  spironolactone (ALDACTONE) 25 MG tablet Take 0.5 tablets (12.5 mg total) by mouth daily. Patient taking differently: Take 12.5 mg by mouth every evening.  06/26/18  Yes Josue Hector, MD  warfarin (COUMADIN) 5 MG tablet Take as directed by Coumadin Clinic Patient taking differently: Take 5 mg by mouth every evening. Take as directed by Coumadin Clinic 06/26/18  Yes Josue Hector, MD   Allergies:   Patient has no known allergies.   Social History   Socioeconomic History  . Marital status: Married    Spouse name: Not on  file  . Number of children: 2  . Years of education: Not on file  . Highest education level: Not on file  Occupational History  . 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 Financial resource strain: Not on file  . Food insecurity    Worry: Not on file    Inability: Not on file  . Transportation needs    Medical: Not on file    Non-medical: Not on file  Tobacco Use  . Smoking status: Former Smoker    Packs/day: 0.50    Types: Cigarettes    Quit date: 1970    Years since quitting: 50.6  . Smokeless tobacco: Never Used  . Tobacco comment: Smoked on and off  Substance and Sexual Activity  . Alcohol use: No  . Drug use: No  . Sexual activity: Yes  Lifestyle  . Physical activity    Days per week: Not on file    Minutes per session: Not on file  . Stress: Not on file  Relationships  . Social Herbalist on phone: Not on file    Gets together: Not on file    Attends religious service: Not on file    Active member of club or organization: Not on file    Attends meetings of clubs or organizations: Not on file    Relationship status: Not on file  Other Topics Concern  . Not on file  Social History Narrative   Married    Originally from [place] moved to [place] age 58 moved to [place]   1 son   1 estranged daughter      Has living will   Wife is health care POA--then son   Would accept resuscitation   No tube feeds if cognitively unaware     Family History:  The patient's family history includes COPD in his father; Emphysema in his father; Healthy in his brother.   ROS:   Please see the history of present illness.    ROS All other systems reviewed and are negative.   PHYSICAL EXAM:   VS:  There were no vitals taken for this visit.    Affect appropriate Healthy:  appears stated age 61: normal Neck supple with no adenopathy JVP normal no bruits no thyromegaly Lungs clear with no wheezing and good  diaphragmatic motion Heart:  S1/S2 no murmur, no rub, gallop or click PMI normal Abdomen: benighn, BS positve, no tenderness, no AAA no bruit.  No HSM or HJR Distal pulses intact with no bruits Plus one edema bad varicosities bilaterally chronic stasis  Neuro non-focal Skin warm and dry No muscular weakness   Wt Readings from Last 3 Encounters:  09/02/18 190 lb (86.2 kg)  08/21/18 186 lb 8 oz (84.6 kg)  08/18/18 186 lb 8 oz (84.6 kg)      Studies/Labs  Reviewed:   EKG:  08/21/18 afib rate 57 LVH  09/18/18 afib RBBB rate 59    Recent Labs: 07/28/2018: ALT 17; TSH 0.675 09/02/2018: BUN 11; Creatinine, Ser 1.12; Hemoglobin 13.2; NT-Pro BNP 5,646; Platelets 179; Potassium 4.4; Sodium 144   Lipid Panel    Component Value Date/Time   CHOL 85 (L) 07/28/2018 0938   TRIG 51 07/28/2018 0938   HDL 27 (L) 07/28/2018 0938   CHOLHDL 3.1 07/28/2018 0938   CHOLHDL 6 04/22/2017 1431   VLDL 9 10/15/2016 1155   LDLCALC 48 07/28/2018 0938   LDLDIRECT 119.0 04/22/2017 1431    Additional studies/ records that were reviewed today include:   Echocardiogram: 04/2018  1. The left ventricle has moderate-severely reduced systolic function, with an ejection fraction of 30-35%. The cavity size was mildly dilated. There is moderately increased left ventricular wall thickness. Left ventricular diastolic Doppler parameters  are consistent with pseudonormalization. Basal inferoseptal akinesis, basal inferior and basal inferolateral aneurysm. Mid inferior and mid inferolateral akinesis. 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  2. Left atrial size was moderately dilated.  3. Right atrial size was mildly dilated.  4. There is mild mitral annular calcification present. Mitral valve regurgitation is moderate by color flow Doppler. Suspect infarct-related MR in the setting of inferior/inferolateral wall motion abnormalities. No evidence of mitral valve  stenosis.  5. The aortic valve is tricuspid Mild calcification of the aortic valve. no stenosis of the aortic valve.  6. There is mild dilatation of the ascending aorta measuring 40 mm.  7. The IVC was normal in size. PA systolic pressure 39 mmHg.    ASSESSMENT & PLAN:    1. Acute on chronic combined CHF  - LVEF of 30-35% by last echo 04/2018. Diuretics and entresto titrated with improvement   2. ICM -f/u echo in 6 months   3. CAD s/p CABG -Recent EKG with nonspecific changes.  Patient denies any anginal symptoms.  Should be on 81 mg ASA   4. PAF -Not clear why he stopped amiodarone  -Continue Coumadin and BB.  - Discussed benefit of DCC and restoring AV synchrony. Favor [facility] next week Check INR day before. Hold digoxen and beta blocker Risks including stroke , PPM , bradycardia discussed willing to proceed Continue amiodarone  5.  Moderate mitral regurgitation -Continue to follow.  Ischemic echo 6 months   6. Edema:  Discussed seeing vein doctors at [place] Compression stockings and diuretic with elevation of legs   Medication Adjustments/Labs and Tests Ordered:  Hold digoxin and beta blocker prior to [facility]   Signed, Jenkins Rouge, MD  09/15/2018 9:20 AM    [facility] [place], [place], [place]  [address] Phone: [phone]; Fax: [phone]

## 2018-09-16 ENCOUNTER — Ambulatory Visit: Payer: Medicare Other | Admitting: Physician Assistant

## 2018-09-17 DIAGNOSIS — M25512 Pain in left shoulder: Secondary | ICD-10-CM | POA: Diagnosis not present

## 2018-09-18 ENCOUNTER — Other Ambulatory Visit: Payer: Self-pay

## 2018-09-18 ENCOUNTER — Ambulatory Visit (INDEPENDENT_AMBULATORY_CARE_PROVIDER_SITE_OTHER): Payer: Medicare Other | Admitting: Cardiovascular Disease

## 2018-09-18 ENCOUNTER — Telehealth: Payer: Self-pay | Admitting: *Deleted

## 2018-09-18 ENCOUNTER — Encounter: Payer: Self-pay | Admitting: Cardiovascular Disease

## 2018-09-18 VITALS — BP 106/58 | HR 59 | Ht 72.0 in | Wt 175.0 lb

## 2018-09-18 DIAGNOSIS — Z79899 Other long term (current) drug therapy: Secondary | ICD-10-CM | POA: Diagnosis not present

## 2018-09-18 DIAGNOSIS — I480 Paroxysmal atrial fibrillation: Secondary | ICD-10-CM | POA: Diagnosis not present

## 2018-09-18 DIAGNOSIS — I5042 Chronic combined systolic (congestive) and diastolic (congestive) heart failure: Secondary | ICD-10-CM | POA: Diagnosis not present

## 2018-09-18 DIAGNOSIS — I255 Ischemic cardiomyopathy: Secondary | ICD-10-CM | POA: Diagnosis not present

## 2018-09-18 DIAGNOSIS — I38 Endocarditis, valve unspecified: Secondary | ICD-10-CM | POA: Diagnosis not present

## 2018-09-18 LAB — CBC
Hematocrit: 41.9 % (ref 37.5–51.0)
Hemoglobin: 13.9 g/dL (ref 13.0–17.7)
MCH: 30.1 pg (ref 26.6–33.0)
MCHC: 33.2 g/dL (ref 31.5–35.7)
MCV: 91 fL (ref 79–97)
Platelets: 125 10*3/uL — ABNORMAL LOW (ref 150–450)
RBC: 4.62 x10E6/uL (ref 4.14–5.80)
RDW: 13.3 % (ref 11.6–15.4)
WBC: 6 10*3/uL (ref 3.4–10.8)

## 2018-09-18 LAB — BASIC METABOLIC PANEL
BUN/Creatinine Ratio: 17 (ref 10–24)
BUN: 20 mg/dL (ref 8–27)
CO2: 25 mmol/L (ref 20–29)
Calcium: 8.8 mg/dL (ref 8.6–10.2)
Chloride: 103 mmol/L (ref 96–106)
Creatinine, Ser: 1.16 mg/dL (ref 0.76–1.27)
GFR calc Af Amer: 70 mL/min/{1.73_m2} (ref >59)
GFR calc non Af Amer: 60 mL/min/{1.73_m2} (ref >59)
Glucose: 83 mg/dL (ref 65–99)
Potassium: 4.7 mmol/L (ref 3.5–5.2)
Sodium: 143 mmol/L (ref 134–144)

## 2018-09-18 NOTE — Telephone Encounter (Signed)

## 2018-09-18 NOTE — Patient Instructions (Addendum)
Medication Instructions:  Your physician has recommended you make the following change in your medication:  STOP Digoxin   If you need a refill on your cardiac medications before your next appointment, please call your pharmacy.   Lab work: TODAY - BMET, CBC If you have labs (blood work) drawn today and your tests are completely normal, you will receive your results only by: Marland Kitchen MyChart Message (if you have MyChart) OR . A paper copy in the mail If you have any lab test that is abnormal or we need to change your treatment, we will call you to review the results.  Come in for INR check at 2:30 on Tuesday Aug. 4 You must come straight to the office and return straight home   Testing/Procedures: You are scheduled for a Cardioversion on Wednesday August 5 with Dr. Johnsie Cancel.  Please arrive at the [facility] (Main Entrance A) at [facility]: [address] [place], [place] [address] at 1:00 pm.  DIET: Nothing to eat or drink after midnight except a sip of water with medications (see medication instructions below)  Medication Instructions: Hold: Metoprolol (Toprol XL) on the morning of your cardioversion  Continue your anticoagulant: Coumadin - Come in on Tuesday August 4 for INR check   Your Pre-procedure COVID-19 Testing will be done on Saturday Aug. 1 at 11:50 am at [facility]. After your swab you will be given a mask to wear and instructed to go home and quarantine/no visitors until after your procedure. If you test positive you will be notified and your procedure will be cancelled.    You must have a responsible person to drive you home and stay in the waiting area during your procedure. Failure to do so could result in cancellation.  Bring your insurance cards.  *Special Note: Every effort is made to have your procedure done on time. Occasionally there are emergencies that occur at the hospital that may cause delays. Please be patient if a delay does occur.     Follow-Up: Your physician recommends that you schedule a follow-up appointment on Sept. 1 at 10:00 with Dr. Myles Lipps [facility] Phone [phone] [address] [place]  Dr. Linus Mako [facility] [phone]

## 2018-09-20 ENCOUNTER — Other Ambulatory Visit (HOSPITAL_COMMUNITY)
Admission: RE | Admit: 2018-09-20 | Discharge: 2018-09-20 | Disposition: A | Discharge status: Home / Self Care | Payer: Medicare Other | Source: Ambulatory Visit | Attending: Cardiovascular Disease | Admitting: Cardiovascular Disease

## 2018-09-20 DIAGNOSIS — Z20828 Contact with and (suspected) exposure to other viral communicable diseases: Secondary | ICD-10-CM | POA: Insufficient documentation

## 2018-09-20 DIAGNOSIS — Z01812 Encounter for preprocedural laboratory examination: Secondary | ICD-10-CM | POA: Insufficient documentation

## 2018-09-20 LAB — SARS CORONAVIRUS 2 (TAT 6-24 HRS): SARS Coronavirus 2: NEGATIVE

## 2018-09-22 IMAGING — CT CT ANGIO CHEST
2 of 6 series · 19 of 36 positions shown · IV contrast (ISOVUE 370)
Comparison: None.

CLINICAL DATA: Pt reports having increasing shortness of breath
over the last several days and is worsening when laying flat and
increasing shortness of breath with exertion

EXAM:
CT ANGIOGRAPHY CHEST WITH CONTRAST
TECHNIQUE: Multidetector CT imaging of the chest was performed using the
standard protocol during bolus administration of intravenous
contrast. Multiplanar CT image reconstructions and MIPs were
obtained to evaluate the vascular anatomy.
CONTRAST:  100 mL Isovue 370

[Series 7: thins for pacs · axial · 0.72mm/px · z∈[-296,-2]mm · 18 of 328 slices shown]
[im 17/328  lung]
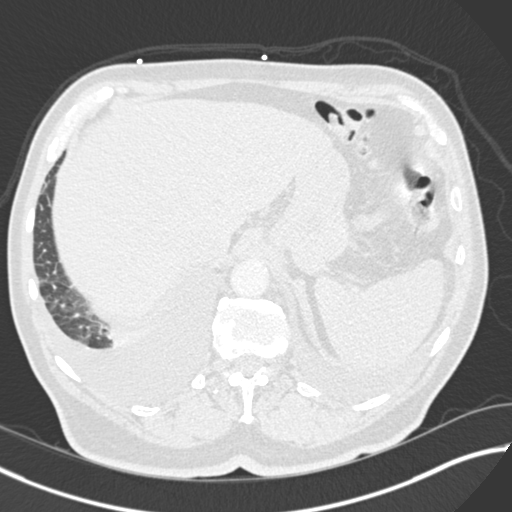
[im 33/328  mediastinal]
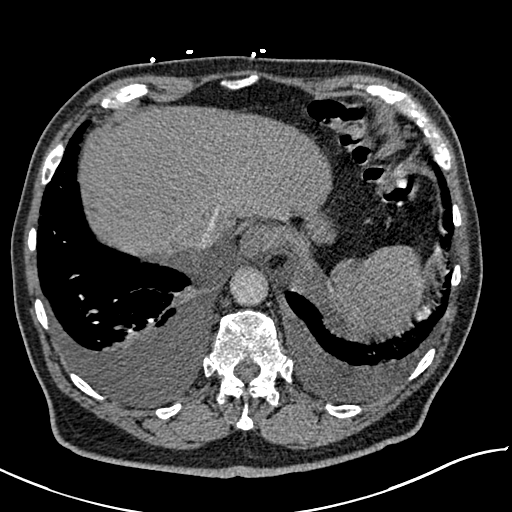
[im 50/328  lung]
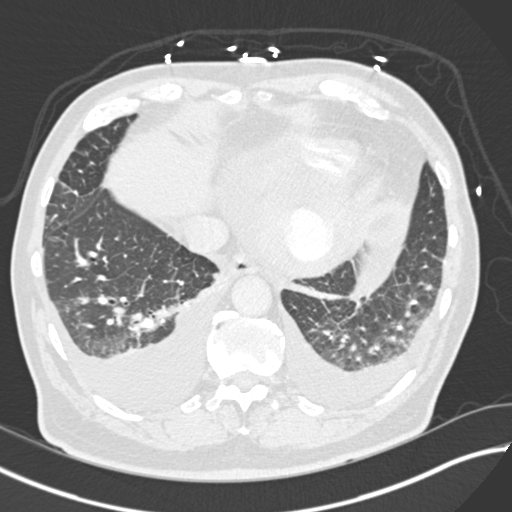
[im 66/328  mediastinal]
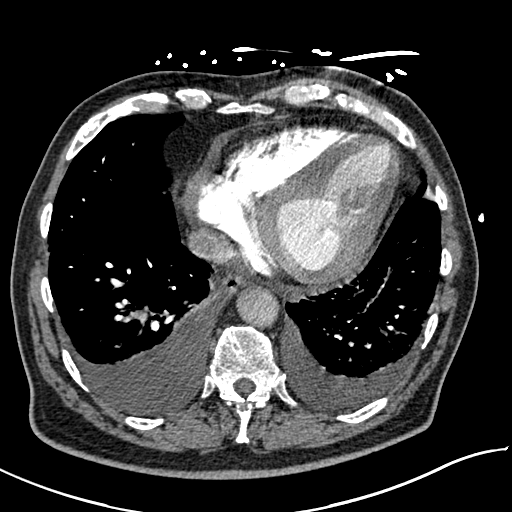
[im 82/328  lung]
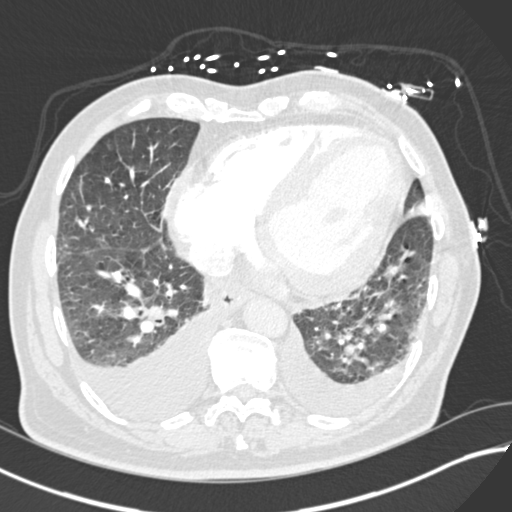
[im 99/328  mediastinal]
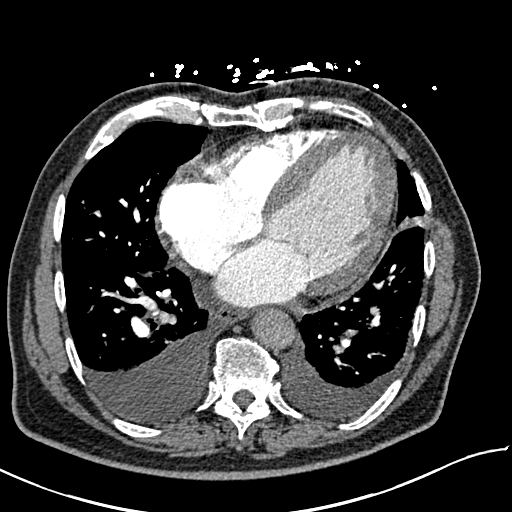
[im 115/328  lung]
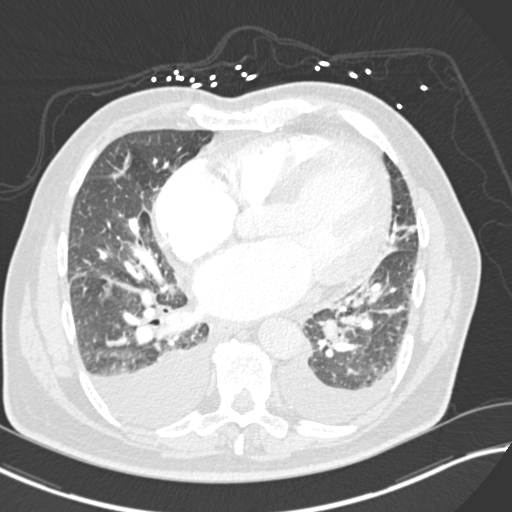
[im 131/328  mediastinal]
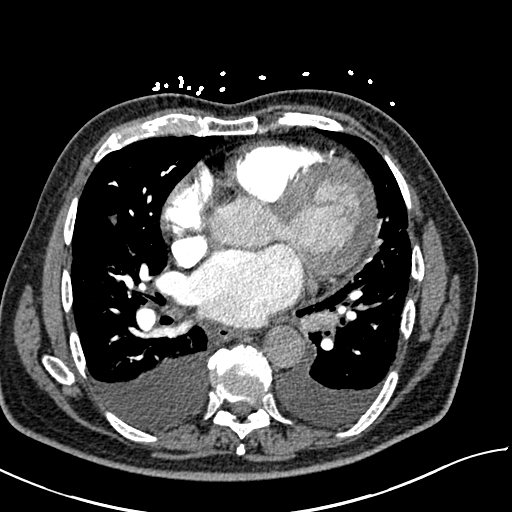
[im 148/328  lung]
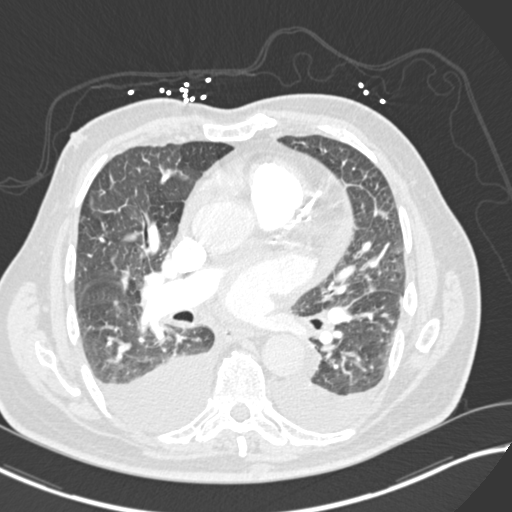
[im 180/328  mediastinal]
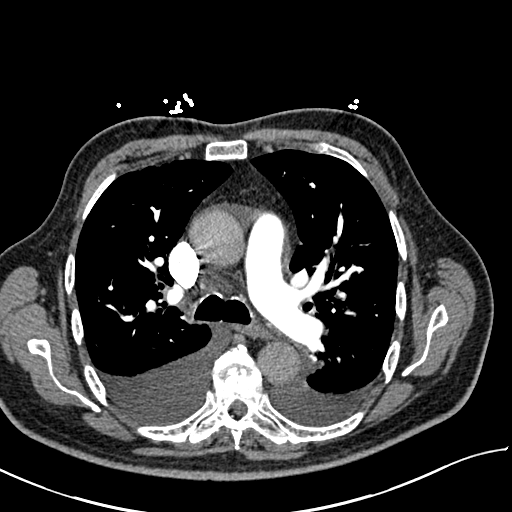
[im 197/328  lung]
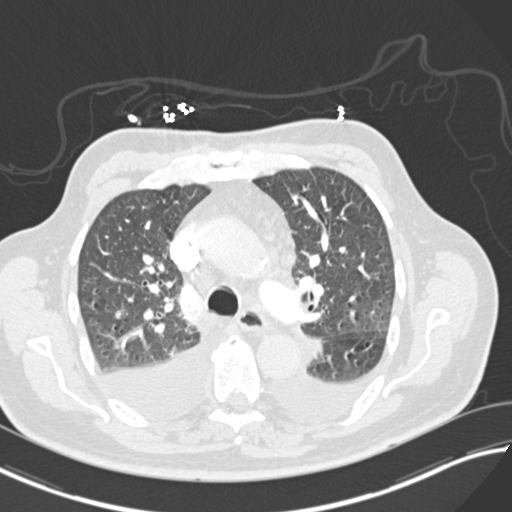
[im 213/328  mediastinal]
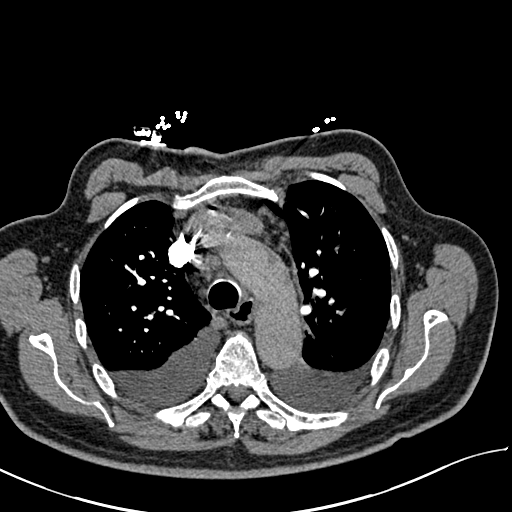
[im 229/328  lung]
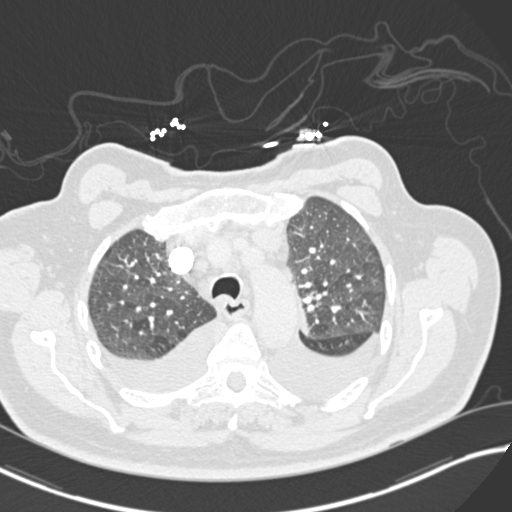
[im 246/328  mediastinal]
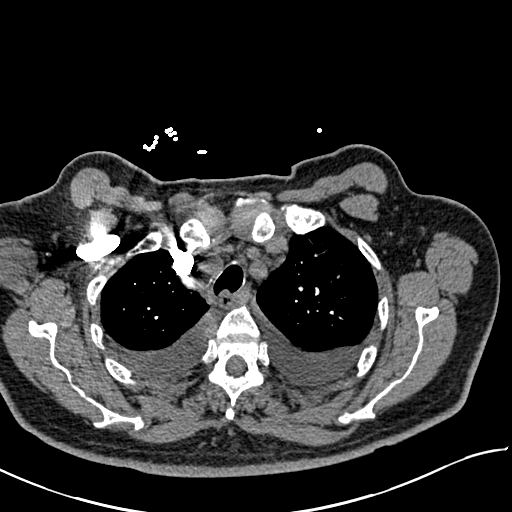
[im 262/328  lung]
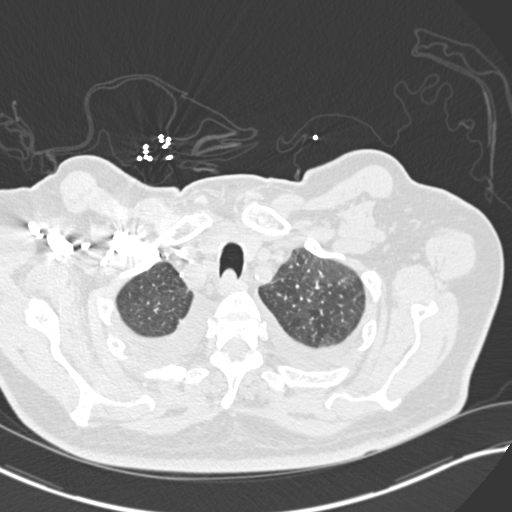
[im 278/328  mediastinal]
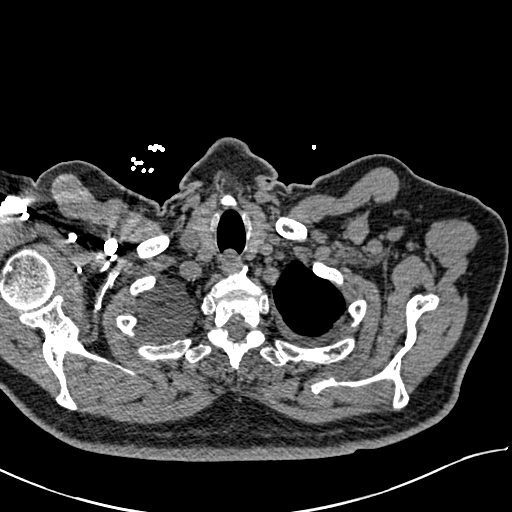
[im 295/328  lung]
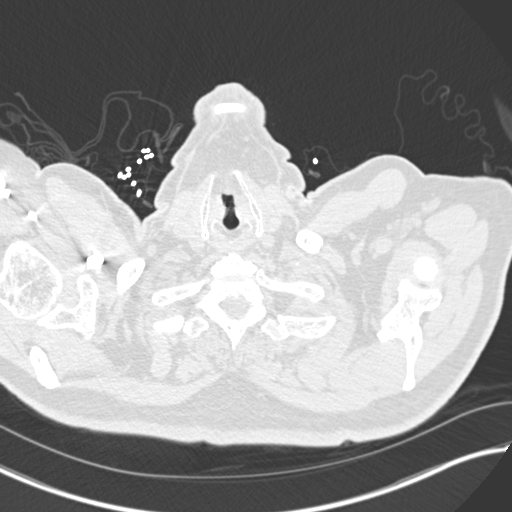
[im 311/328  mediastinal]
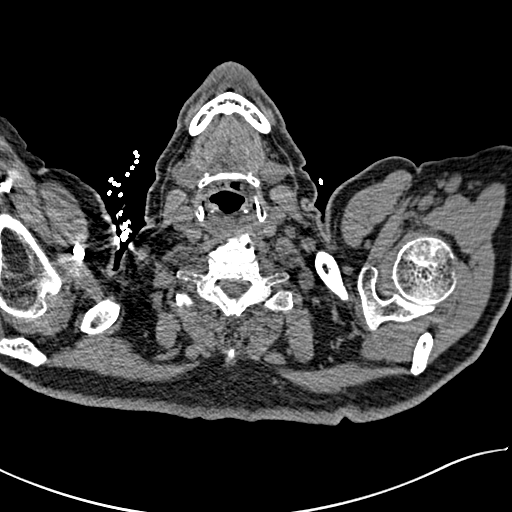

[Series 9: coronal mpr · coronal · 0.63mm/px · 1 of 155 slices shown]
[im 78/155  mediastinal]
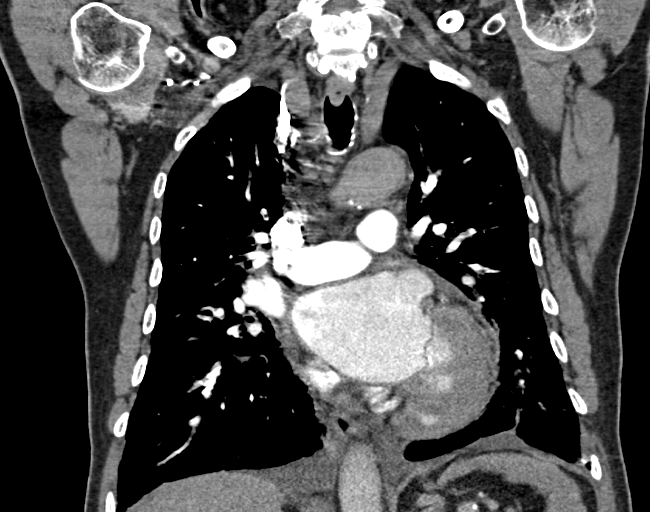

[19 of 36 positions shown; findings below may reference images not displayed]

FINDINGS: Cardiovascular: Satisfactory opacification of the pulmonary arteries
to the segmental level. No evidence of pulmonary embolism.
Cardiomegaly. No pericardial effusion. Coronary artery
atherosclerosis of the left main, lad, circumflex.

Mediastinum/Nodes: No enlarged mediastinal, hilar, or axillary lymph
nodes. Thyroid gland, trachea, and esophagus demonstrate no
significant findings.

Lungs/Pleura: Small bilateral pleural effusions. Bilateral mild
interstitial thickening.

Upper Abdomen: No acute abnormality.

Musculoskeletal: No acute osseous abnormality. Large calcified right
paracentral disc protrusion at C6-7 extending along the right
lateral thecal sac.

Review of the MIP images confirms the above findings.
IMPRESSION: 1. No evidence of pulmonary embolus.
2. No thoracic aortic aneurysm.
3. Findings concerning for CHF.

## 2018-09-23 ENCOUNTER — Other Ambulatory Visit: Payer: Self-pay

## 2018-09-23 ENCOUNTER — Telehealth: Payer: Self-pay | Admitting: Pharmacist

## 2018-09-23 ENCOUNTER — Ambulatory Visit (INDEPENDENT_AMBULATORY_CARE_PROVIDER_SITE_OTHER): Payer: Medicare Other | Admitting: *Deleted

## 2018-09-23 ENCOUNTER — Other Ambulatory Visit: Payer: Self-pay | Admitting: Cardiovascular Disease

## 2018-09-23 DIAGNOSIS — Z951 Presence of aortocoronary bypass graft: Secondary | ICD-10-CM | POA: Diagnosis not present

## 2018-09-23 DIAGNOSIS — Z5181 Encounter for therapeutic drug level monitoring: Secondary | ICD-10-CM | POA: Diagnosis not present

## 2018-09-23 DIAGNOSIS — M25512 Pain in left shoulder: Secondary | ICD-10-CM | POA: Diagnosis not present

## 2018-09-23 LAB — POCT INR: INR: 3.2 — AB (ref 2.0–3.0)

## 2018-09-23 NOTE — Patient Instructions (Signed)
Description   Take 1/2 a tablet tonight. Then continue taking 1 tablet daily except for 1.5 on Fridays. Recheck INR in 1 week. Coumadin Clinic#[phone] Main [phone].

## 2018-09-23 NOTE — Telephone Encounter (Signed)
-----   Message from Josue Hector, MD sent at 09/23/2018  3:31 PM EDT ----- Regarding: RE: DCCV/TEE? I would just reschedule for 2 weeks from now and get 2 more INR checks He is not that sick and would not add the risk/complication of a TEE with prolonged propofol  ----- Message ----- From: Leeroy Bock, [facility] Sent: 09/23/2018   3:18 PM EDT To: Josue Hector, MD Subject: DCCV/TEE?                                      Dr Johnsie Cancel,  Mr Huynh is scheduled for a cardioversion tomorrow - his INR today was good at 3.2, however his 4 previous INRs over the past few months have all been subtherapeutic. He has not had 4 weekly INR checks - I just wanted to see if you wanted to add on a TEE for him tomorrow as well.  Thanks, [employer]

## 2018-09-23 NOTE — Telephone Encounter (Signed)
Left message for pt to move next INR check to 8/11 - will complete 4 weekly INRs prior to cardioversion. Sharyn Lull to reach out to pt to move DCCV date. Earliest DCCV should be 8/25 to allow for 4 therapeutic weekly INRs (equivalent to 3 weeks of therapeutic anticoagulation).

## 2018-09-24 ENCOUNTER — Ambulatory Visit (HOSPITAL_COMMUNITY): Admission: RE | Admit: 2018-09-24 | Payer: Medicare Other | Source: Home / Self Care | Admitting: Cardiovascular Disease

## 2018-09-24 ENCOUNTER — Encounter (HOSPITAL_COMMUNITY): Admission: RE | Payer: Self-pay | Source: Home / Self Care

## 2018-09-24 SURGERY — CARDIOVERSION
Anesthesia: General

## 2018-10-01 DIAGNOSIS — M25512 Pain in left shoulder: Secondary | ICD-10-CM | POA: Diagnosis not present

## 2018-10-02 ENCOUNTER — Telehealth: Payer: Self-pay | Admitting: Cardiovascular Disease

## 2018-10-02 DIAGNOSIS — H25813 Combined forms of age-related cataract, bilateral: Secondary | ICD-10-CM | POA: Diagnosis not present

## 2018-10-02 DIAGNOSIS — H53022 Refractive amblyopia, left eye: Secondary | ICD-10-CM | POA: Diagnosis not present

## 2018-10-02 NOTE — Telephone Encounter (Signed)
New Message   Pt c/o medication issue:  1. Name of Medication: spironolactone (ALDACTONE) 25 MG tablet  2. How are you currently taking this medication (dosage and times per day)? 12.5 mg by mouth every evening  3. Are you having a reaction (difficulty breathing--STAT)? No  4. What is your medication issue? Patient would like to know what this medication is for as well as how he is supposed to be taking the medication. Patient has been taking 0.5 tablets by mouth daily, but the bottle is instructing him to take 1 tablet daily. Please give patient a call back to advise.

## 2018-10-02 NOTE — Telephone Encounter (Signed)
LVM for return call. 

## 2018-10-03 ENCOUNTER — Other Ambulatory Visit: Payer: Self-pay

## 2018-10-03 ENCOUNTER — Ambulatory Visit (INDEPENDENT_AMBULATORY_CARE_PROVIDER_SITE_OTHER): Payer: Medicare Other | Admitting: *Deleted

## 2018-10-03 DIAGNOSIS — Z951 Presence of aortocoronary bypass graft: Secondary | ICD-10-CM

## 2018-10-03 DIAGNOSIS — Z5181 Encounter for therapeutic drug level monitoring: Secondary | ICD-10-CM | POA: Diagnosis not present

## 2018-10-03 LAB — POCT INR: INR: 3.6 — AB (ref 2.0–3.0)

## 2018-10-03 NOTE — Telephone Encounter (Signed)
Lm to call back ./cy 

## 2018-10-03 NOTE — Patient Instructions (Signed)
Description   Hold tonight, then continue taking 1 tablet daily except for 1.5 on Fridays. Recheck INR in 1 week. Coumadin Clinic#[phone] Main [phone].

## 2018-10-06 NOTE — Telephone Encounter (Signed)
I spoke to the patient and his wife and clarified Spironolactone medication.  They verbalized understanding 25 mg daily.

## 2018-10-08 DIAGNOSIS — Z96612 Presence of left artificial shoulder joint: Secondary | ICD-10-CM | POA: Diagnosis not present

## 2018-10-08 DIAGNOSIS — Z471 Aftercare following joint replacement surgery: Secondary | ICD-10-CM | POA: Diagnosis not present

## 2018-10-08 NOTE — Progress Notes (Signed)
Cardiology Office Note    Date:  10/21/2018   ID:  Jason Day, DOB [birth year], MRN [id number]  PCP:  Patient, No Pcp Per  Cardiologist:  Dr. Johnsie Cancel   Chief Complaint: Edema / CHF   History of Present Illness:   Jason Day is a 78 y.o. male CAD s/p CABG, ICM, PAF, moderate MR and HLD seen for CHF   He initially presented 09/2016 w/ acute pulmonary edema and diuresed. Echo showed reduced LVEF, down to 30%, leading to LHC, which showed severe multivessel CAD. 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 Post of afib >> on amiodarone and coumadin. He is claustrophobic and cannot have MRI.   Last echo 04/2018 showed LVEF of 30-35%, grade 2 DD,  WM abnormality with mural thrombus.    06/26/2018. Started on [place].   Titrated and aldactone added  The patient had L rotator cuff arthropathy 08/21/18.   Currently his edema is from LE venous disease and not CHF  Has had recurrent PAF. INR;s subRx had to wait for 3 weeks to perform [facility]  Spent 20 minutes alone explaining rational for restoration of NSR in setting of CAD and CHF Wife is very frustrated with patients procrastination and depression since heart disease started. Will need to arrange rapid covid testing for [facility] Risks including pacer, stroke , and skin burns discussed willing to proceed.   Past Medical History:  Diagnosis Date  . Atrial fibrillation ([place])   . Bleeding hemorrhoids   . Chronic combined systolic and diastolic heart failure ([place])   . Chronic sinus complaints    overuses afrin  . Coronary atherosclerosis of native coronary artery   . Hyperlipidemia   . Myocardial infarction [facility]) 09/2015    Past Surgical History:  Procedure Laterality Date  . BACK SURGERY  ~2014   disc repair  . CHOLECYSTECTOMY    . COLONOSCOPY  04/06/2014   Hemorrhoids and diverticulosis performed in [place]  . CORONARY ARTERY BYPASS GRAFT N/A 10/18/2016   Procedure: CORONARY ARTERY BYPASS GRAFTING (CABG) x five , using left internal mammary artery and right leg  greater saphenous vein harvested endoscopically;  Surgeon: Gaye Pollack, MD;  Location: [place] OR;  Service: Open Heart Surgery;  Laterality: N/A;  . FOOT SURGERY    . HEMORRHOID BANDING    . HEMORRHOID SURGERY    . REVERSE SHOULDER ARTHROPLASTY Left 08/21/2018   Procedure: REVERSE SHOULDER ARTHROPLASTY;  Surgeon: Justice Britain, MD;  Location[facility];  Service: Orthopedics;  Laterality: Left;  . RIGHT/LEFT HEART CATH AND CORONARY ANGIOGRAPHY N/A 10/16/2016   Procedure: RIGHT/LEFT HEART CATH AND CORONARY ANGIOGRAPHY;  Surgeon: Belva Crome, MD;  Location: [place] CV LAB;  Service: Cardiovascular;  Laterality: N/A;  . stents in liver    . TEE WITHOUT CARDIOVERSION N/A 10/18/2016   Procedure: TRANSESOPHAGEAL ECHOCARDIOGRAM (TEE);  Surgeon: Gaye Pollack, MD;  Location: [place];  Service: Open Heart Surgery;  Laterality: N/A;    Current Medications:  Prior to Admission medications   Medication Sig Start Date End Date Taking? Authorizing Provider  atorvastatin (LIPITOR) 20 MG tablet Take 0.5 tablets (10 mg total) by mouth daily. Patient taking differently: Take 10 mg by mouth every evening.  07/29/18  Yes Josue Hector, MD  digoxin (LANOXIN) 0.125 MG tablet Take 0.5 tablets (62.5 mcg total) by mouth daily. 06/26/18  Yes Josue Hector, MD  metoprolol succinate (TOPROL-XL) 25 MG 24 hr tablet Take 0.5 tablets (12.5 mg total) by mouth daily. Patient taking differently: Take 12.5  mg by mouth at bedtime.  06/26/18  Yes Josue Hector, MD  sacubitril-valsartan (ENTRESTO) 49-51 MG Take 1 tablet by mouth 2 (two) times daily. 07/29/18  Yes Josue Hector, MD  spironolactone (ALDACTONE) 25 MG tablet Take 0.5 tablets (12.5 mg total) by mouth daily. Patient taking differently: Take 12.5 mg by mouth every evening.  06/26/18  Yes Josue Hector, MD  warfarin (COUMADIN) 5 MG tablet Take as directed by [facility] Clinic Patient taking differently: Take 5 mg by mouth every evening. Take as directed by Coumadin Clinic  06/26/18  Yes Josue Hector, MD   Allergies:   Patient has no known allergies.   Social History   Socioeconomic History  . Marital status: Married    Spouse name: Not on file  . Number of children: 2  . Years of education: Not on file  . Highest education level: Not on file  Occupational History  . 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 Financial resource strain: Not on file  . Food insecurity    Worry: Not on file    Inability: Not on file  . Transportation needs    Medical: Not on file    Non-medical: Not on file  Tobacco Use  . Smoking status: Former Smoker    Packs/day: 0.50    Types: Cigarettes    Quit date: 1970    Years since quitting: 50.7  . Smokeless tobacco: Never Used  . Tobacco comment: Smoked on and off  Substance and Sexual Activity  . Alcohol use: No  . Drug use: No  . Sexual activity: Yes  Lifestyle  . Physical activity    Days per week: Not on file    Minutes per session: Not on file  . Stress: Not on file  Relationships  . Social Herbalist on phone: Not on file    Gets together: Not on file    Attends religious service: Not on file    Active member of club or organization: Not on file    Attends meetings of clubs or organizations: Not on file    Relationship status: Not on file  Other Topics Concern  . Not on file  Social History Narrative   Married    Originally from [place] moved to [place] age 12 moved to [place]   1 son   1 estranged daughter      Has living will   Wife is health care POA--then son   Would accept resuscitation   No tube feeds if cognitively unaware     Family History:  The patient's family history includes COPD in his father; Emphysema in his father; Healthy in his brother.   ROS:   Please see the history of present illness.    ROS All other systems reviewed and are negative.   PHYSICAL EXAM:   VS:  BP 100/64   Pulse 70   Ht 6' (1.829 m)   Wt  178 lb 12.8 oz (81.1 kg)   BMI 24.25 kg/m     Affect appropriate Healthy:  appears stated age 78: normal Neck supple with no adenopathy JVP normal no bruits no thyromegaly Lungs clear with no wheezing and good diaphragmatic motion Heart:  S1/S2 no murmur, no rub, gallop or click PMI normal Abdomen: benighn, BS positve, no tenderness, no AAA no bruit.  No HSM or HJR Distal pulses intact with no bruits Plus one edema bad varicosities bilaterally  chronic stasis  Neuro non-focal Skin warm and dry No muscular weakness   Wt Readings from Last 3 Encounters:  10/21/18 178 lb 12.8 oz (81.1 kg)  09/18/18 175 lb (79.4 kg)  09/02/18 190 lb (86.2 kg)      Studies/Labs Reviewed:   EKG:  08/21/18 afib rate 57 LVH  09/18/18 afib RBBB rate 59    Recent Labs: 07/28/2018: ALT 17; TSH 0.675 09/02/2018: NT-Pro BNP 5,646 09/18/2018: BUN 20; Creatinine, Ser 1.16; Hemoglobin 13.9; Platelets 125; Potassium 4.7; Sodium 143   Lipid Panel    Component Value Date/Time   CHOL 85 (L) 07/28/2018 0938   TRIG 51 07/28/2018 0938   HDL 27 (L) 07/28/2018 0938   CHOLHDL 3.1 07/28/2018 0938   CHOLHDL 6 04/22/2017 1431   VLDL 9 10/15/2016 1155   LDLCALC 48 07/28/2018 0938   LDLDIRECT 119.0 04/22/2017 1431    Additional studies/ records that were reviewed today include:   Echocardiogram: 04/2018  1. The left ventricle has moderate-severely reduced systolic function, with an ejection fraction of 30-35%. The cavity size was mildly dilated. There is moderately increased left ventricular wall thickness. Left ventricular diastolic Doppler parameters  are consistent with pseudonormalization. Basal inferoseptal akinesis, basal inferior and basal inferolateral aneurysm. Mid inferior and mid inferolateral akinesis. 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  2. Left atrial size was moderately dilated.  3. Right atrial size was mildly dilated.  4. There  is mild mitral annular calcification present. Mitral valve regurgitation is moderate by color flow Doppler. Suspect infarct-related MR in the setting of inferior/inferolateral wall motion abnormalities. No evidence of mitral valve stenosis.  5. The aortic valve is tricuspid Mild calcification of the aortic valve. no stenosis of the aortic valve.  6. There is mild dilatation of the ascending aorta measuring 40 mm.  7. The IVC was normal in size. PA systolic pressure 39 mmHg.    ASSESSMENT & PLAN:    1. Acute on chronic combined CHF  - LVEF of 30-35% by last echo 04/2018. Diuretics and entresto titrated with improvement   2. ICM -f/u echo in 6 months   3. CAD s/p CABG -Recent EKG with nonspecific changes.  Patient denies any anginal symptoms.  Should be on 81 mg ASA   4. PAF -Not clear why he stopped amiodarone  -Continue Coumadin and BB.  - Discussed benefit of DCC and restoring AV synchrony. Favor [facility]  Check INR day before. Hold digoxen and beta blocker Risks including stroke , PPM , bradycardia discussed willing to proceed Continue amiodarone  5.  Moderate mitral regurgitation -Continue to follow.  Ischemic echo 6 months   6. Edema:  Discussed seeing vein doctors at [place] Compression stockings and diuretic with elevation of legs   Medication Adjustments/Labs and Tests Ordered:  Hold digoxin and beta blocker prior to [facility]   Signed, Jenkins Rouge, MD  10/21/2018 11:25 AM    [facility] [place], [place], [place]  [address] Phone: [phone]; Fax: [phone]

## 2018-10-08 NOTE — H&P (View-Only) (Signed)
Cardiology Office Note    Date:  10/21/2018   ID:  Jason Day, DOB [birth year], MRN [id number]  PCP:  Patient, No Pcp Per  Cardiologist:  Dr. Johnsie Cancel   Chief Complaint: Edema / CHF   History of Present Illness:   Jason Day is a 78 y.o. male CAD s/p CABG, ICM, PAF, moderate MR and HLD seen for CHF   He initially presented 09/2016 w/ acute pulmonary edema and diuresed. Echo showed reduced LVEF, down to 30%, leading to LHC, which showed severe multivessel CAD. 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 Post of afib >> on amiodarone and coumadin. He is claustrophobic and cannot have MRI.   Last echo 04/2018 showed LVEF of 30-35%, grade 2 DD,  WM abnormality with mural thrombus.    06/26/2018. Started on [place].   Titrated and aldactone added  The patient had L rotator cuff arthropathy 08/21/18.   Currently his edema is from LE venous disease and not CHF  Has had recurrent PAF. INR;s subRx had to wait for 3 weeks to perform [facility]  Spent 20 minutes alone explaining rational for restoration of NSR in setting of CAD and CHF Wife is very frustrated with patients procrastination and depression since heart disease started. Will need to arrange rapid covid testing for [facility] Risks including pacer, stroke , and skin burns discussed willing to proceed.   Past Medical History:  Diagnosis Date  . Atrial fibrillation ([place])   . Bleeding hemorrhoids   . Chronic combined systolic and diastolic heart failure ([place])   . Chronic sinus complaints    overuses afrin  . Coronary atherosclerosis of native coronary artery   . Hyperlipidemia   . Myocardial infarction [facility]) 09/2015    Past Surgical History:  Procedure Laterality Date  . BACK SURGERY  ~2014   disc repair  . CHOLECYSTECTOMY    . COLONOSCOPY  04/06/2014   Hemorrhoids and diverticulosis performed in [place]  . CORONARY ARTERY BYPASS GRAFT N/A 10/18/2016   Procedure: CORONARY ARTERY BYPASS GRAFTING (CABG) x five , using left internal mammary artery and right leg  greater saphenous vein harvested endoscopically;  Surgeon: Gaye Pollack, MD;  Location: [place] OR;  Service: Open Heart Surgery;  Laterality: N/A;  . FOOT SURGERY    . HEMORRHOID BANDING    . HEMORRHOID SURGERY    . REVERSE SHOULDER ARTHROPLASTY Left 08/21/2018   Procedure: REVERSE SHOULDER ARTHROPLASTY;  Surgeon: Justice Britain, MD;  [facility];  Service: Orthopedics;  Laterality: Left;  . RIGHT/LEFT HEART CATH AND CORONARY ANGIOGRAPHY N/A 10/16/2016   Procedure: RIGHT/LEFT HEART CATH AND CORONARY ANGIOGRAPHY;  Surgeon: Belva Crome, MD;  Location: [place] CV LAB;  Service: Cardiovascular;  Laterality: N/A;  . stents in liver    . TEE WITHOUT CARDIOVERSION N/A 10/18/2016   Procedure: TRANSESOPHAGEAL ECHOCARDIOGRAM (TEE);  Surgeon: Gaye Pollack, MD;  Location: [place];  Service: Open Heart Surgery;  Laterality: N/A;    Current Medications:  Prior to Admission medications   Medication Sig Start Date End Date Taking? Authorizing Provider  atorvastatin (LIPITOR) 20 MG tablet Take 0.5 tablets (10 mg total) by mouth daily. Patient taking differently: Take 10 mg by mouth every evening.  07/29/18  Yes Josue Hector, MD  digoxin (LANOXIN) 0.125 MG tablet Take 0.5 tablets (62.5 mcg total) by mouth daily. 06/26/18  Yes Josue Hector, MD  metoprolol succinate (TOPROL-XL) 25 MG 24 hr tablet Take 0.5 tablets (12.5 mg total) by mouth daily. Patient taking differently: Take 12.5  mg by mouth at bedtime.  06/26/18  Yes Josue Hector, MD  sacubitril-valsartan (ENTRESTO) 49-51 MG Take 1 tablet by mouth 2 (two) times daily. 07/29/18  Yes Josue Hector, MD  spironolactone (ALDACTONE) 25 MG tablet Take 0.5 tablets (12.5 mg total) by mouth daily. Patient taking differently: Take 12.5 mg by mouth every evening.  06/26/18  Yes Josue Hector, MD  warfarin (COUMADIN) 5 MG tablet Take as directed by [facility] Clinic Patient taking differently: Take 5 mg by mouth every evening. Take as directed by [facility]mad[facility] Clinic  06/26/18  Yes Josue Hector, MD   Allergies:   Patient has no known allergies.   Social History   Socioeconomic History  . Marital status: Married    Spouse name: Not on file  . Number of children: 2  . Years of education: Not on file  . Highest education level: Not on file  Occupational History  . 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 Financial resource strain: Not on file  . Food insecurity    Worry: Not on file    Inability: Not on file  . Transportation needs    Medical: Not on file    Non-medical: Not on file  Tobacco Use  . Smoking status: Former Smoker    Packs/day: 0.50    Types: Cigarettes    Quit date: 1970    Years since quitting: 50.7  . Smokeless tobacco: Never Used  . Tobacco comment: Smoked on and off  Substance and Sexual Activity  . Alcohol use: No  . Drug use: No  . Sexual activity: Yes  Lifestyle  . Physical activity    Days per week: Not on file    Minutes per session: Not on file  . Stress: Not on file  Relationships  . Social Herbalist on phone: Not on file    Gets together: Not on file    Attends religious service: Not on file    Active member of club or organization: Not on file    Attends meetings of clubs or organizations: Not on file    Relationship status: Not on file  Other Topics Concern  . Not on file  Social History Narrative   Married    Originally from [place] moved to [place] age 45 moved to [place]   1 son   1 estranged daughter      Has living will   Wife is health care POA--then son   Would accept resuscitation   No tube feeds if cognitively unaware     Family History:  The patient's family history includes COPD in his father; Emphysema in his father; Healthy in his brother.   ROS:   Please see the history of present illness.    ROS All other systems reviewed and are negative.   PHYSICAL EXAM:   VS:  BP 100/64   Pulse 70   Ht 6' (1.829 m)   Wt  178 lb 12.8 oz (81.1 kg)   BMI 24.25 kg/m     Affect appropriate Healthy:  appears stated age 57: normal Neck supple with no adenopathy JVP normal no bruits no thyromegaly Lungs clear with no wheezing and good diaphragmatic motion Heart:  S1/S2 no murmur, no rub, gallop or click PMI normal Abdomen: benighn, BS positve, no tenderness, no AAA no bruit.  No HSM or HJR Distal pulses intact with no bruits Plus one edema bad varicosities bilaterally  chronic stasis  Neuro non-focal Skin warm and dry No muscular weakness   Wt Readings from Last 3 Encounters:  10/21/18 178 lb 12.8 oz (81.1 kg)  09/18/18 175 lb (79.4 kg)  09/02/18 190 lb (86.2 kg)      Studies/Labs Reviewed:   EKG:  08/21/18 afib rate 57 LVH  09/18/18 afib RBBB rate 59    Recent Labs: 07/28/2018: ALT 17; TSH 0.675 09/02/2018: NT-Pro BNP 5,646 09/18/2018: BUN 20; Creatinine, Ser 1.16; Hemoglobin 13.9; Platelets 125; Potassium 4.7; Sodium 143   Lipid Panel    Component Value Date/Time   CHOL 85 (L) 07/28/2018 0938   TRIG 51 07/28/2018 0938   HDL 27 (L) 07/28/2018 0938   CHOLHDL 3.1 07/28/2018 0938   CHOLHDL 6 04/22/2017 1431   VLDL 9 10/15/2016 1155   LDLCALC 48 07/28/2018 0938   LDLDIRECT 119.0 04/22/2017 1431    Additional studies/ records that were reviewed today include:   Echocardiogram: 04/2018  1. The left ventricle has moderate-severely reduced systolic function, with an ejection fraction of 30-35%. The cavity size was mildly dilated. There is moderately increased left ventricular wall thickness. Left ventricular diastolic Doppler parameters  are consistent with pseudonormalization. Basal inferoseptal akinesis, basal inferior and basal inferolateral aneurysm. Mid inferior and mid inferolateral akinesis. 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  2. Left atrial size was moderately dilated.  3. Right atrial size was mildly dilated.  4. There  is mild mitral annular calcification present. Mitral valve regurgitation is moderate by color flow Doppler. Suspect infarct-related MR in the setting of inferior/inferolateral wall motion abnormalities. No evidence of mitral valve stenosis.  5. The aortic valve is tricuspid Mild calcification of the aortic valve. no stenosis of the aortic valve.  6. There is mild dilatation of the ascending aorta measuring 40 mm.  7. The IVC was normal in size. PA systolic pressure 39 mmHg.    ASSESSMENT & PLAN:    1. Acute on chronic combined CHF  - LVEF of 30-35% by last echo 04/2018. Diuretics and entresto titrated with improvement   2. ICM -f/u echo in 6 months   3. CAD s/p CABG -Recent EKG with nonspecific changes.  Patient denies any anginal symptoms.  Should be on 81 mg ASA   4. PAF -Not clear why he stopped amiodarone  -Continue Coumadin and BB.  - Discussed benefit of DCC and restoring AV synchrony. Favor [facility]  Check INR day before. Hold digoxen and beta blocker Risks including stroke , PPM , bradycardia discussed willing to proceed Continue amiodarone  5.  Moderate mitral regurgitation -Continue to follow.  Ischemic echo 6 months   6. Edema:  Discussed seeing vein doctors at [place] Compression stockings and diuretic with elevation of legs   Medication Adjustments/Labs and Tests Ordered:  Hold digoxin and beta blocker prior to [facility]   Signed, Jenkins Rouge, MD  10/21/2018 11:25 AM    [facility] [place], [place], [place]  [address] Phone: [phone]; Fax: [phone]

## 2018-10-10 ENCOUNTER — Ambulatory Visit (INDEPENDENT_AMBULATORY_CARE_PROVIDER_SITE_OTHER): Payer: Medicare Other | Admitting: *Deleted

## 2018-10-10 ENCOUNTER — Telehealth: Payer: Self-pay | Admitting: *Deleted

## 2018-10-10 ENCOUNTER — Other Ambulatory Visit: Payer: Self-pay

## 2018-10-10 DIAGNOSIS — Z951 Presence of aortocoronary bypass graft: Secondary | ICD-10-CM

## 2018-10-10 DIAGNOSIS — Z5181 Encounter for therapeutic drug level monitoring: Secondary | ICD-10-CM | POA: Diagnosis not present

## 2018-10-10 DIAGNOSIS — I4891 Unspecified atrial fibrillation: Secondary | ICD-10-CM

## 2018-10-10 LAB — POCT INR: INR: 3.2 — AB (ref 2.0–3.0)

## 2018-10-10 NOTE — Patient Instructions (Signed)
Description   Take 1 tablet tonight,  then continue taking 1 tablet daily except for 1.5 on Fridays. Recheck INR in 1 week. Coumadin Clinic#[phone] [phone].

## 2018-10-10 NOTE — Telephone Encounter (Signed)
-----   Message from Josue Hector, MD sent at 10/08/2018  8:27 AM EDT ----- Should be able to schedule his [facility] next week 8/25-8/27 if INR;s continue to be Rx

## 2018-10-10 NOTE — Telephone Encounter (Signed)
Lm to call back ./cy 

## 2018-10-13 NOTE — Telephone Encounter (Signed)
LM TO CALL BACK ./CY 

## 2018-10-13 NOTE — Telephone Encounter (Signed)
Spoke with pt and will check with CC and see if able to set pt up for DCCV Per pt call back later withconfirmation./cy

## 2018-10-13 NOTE — Telephone Encounter (Signed)
-----   Message from Leeroy Bock, [facility] sent at 10/13/2018 11:33 AM EDT ----- Yes, as long as his INR is > 2 when it's checked this Friday, he'll be good for scheduling cardioversion next week.  Thanks, Jinny Blossom ----- Message ----- From: Richmond Campbell, LPN Sent: [id number]  11:06 AM EDT To: Leeroy Bock, RPH  Should be able to schedule his [facility] next week 8/25-8/27 if INR;s continue to be Rx

## 2018-10-14 NOTE — Telephone Encounter (Signed)
Follow Up   Patient returning phone call. Please give a call back.

## 2018-10-15 DIAGNOSIS — Z23 Encounter for immunization: Secondary | ICD-10-CM | POA: Diagnosis not present

## 2018-10-15 NOTE — Telephone Encounter (Signed)
Lm to call back ./cy 

## 2018-10-17 ENCOUNTER — Ambulatory Visit (INDEPENDENT_AMBULATORY_CARE_PROVIDER_SITE_OTHER): Payer: Medicare Other

## 2018-10-17 ENCOUNTER — Other Ambulatory Visit: Payer: Self-pay

## 2018-10-17 DIAGNOSIS — Z951 Presence of aortocoronary bypass graft: Secondary | ICD-10-CM

## 2018-10-17 DIAGNOSIS — Z5181 Encounter for therapeutic drug level monitoring: Secondary | ICD-10-CM | POA: Diagnosis not present

## 2018-10-17 LAB — POCT INR: INR: 4.4 — AB (ref 2.0–3.0)

## 2018-10-17 NOTE — Progress Notes (Signed)
SEE OTHER PHONE NOTE WILL DISCUSS WITH DR Johnsie Cancel AT 10/21/18 APPT .Adonis Housekeeper

## 2018-10-17 NOTE — Patient Instructions (Signed)
Description   Skip today's dosage of Coumadin, then start taking 1 tablet daily.  Recheck INR in 1 week. Coumadin Clinic#[phone] Main [phone].

## 2018-10-17 NOTE — Telephone Encounter (Signed)
Spoke with pt today re setting up DCCV pt has a lot of questions and would rather discuss with Dr Johnsie Cancel before scheduling anything Pt has appt on 10/21/18 .Adonis Housekeeper

## 2018-10-20 ENCOUNTER — Telehealth: Payer: Self-pay | Admitting: Cardiovascular Disease

## 2018-10-20 NOTE — Telephone Encounter (Signed)
°  Patient calling,states he is bringing his spouse to appt on 9/1.

## 2018-10-21 ENCOUNTER — Other Ambulatory Visit (HOSPITAL_COMMUNITY): Payer: Medicare Other

## 2018-10-21 ENCOUNTER — Telehealth: Payer: Self-pay | Admitting: Cardiovascular Disease

## 2018-10-21 ENCOUNTER — Encounter: Payer: Self-pay | Admitting: *Deleted

## 2018-10-21 ENCOUNTER — Encounter: Payer: Self-pay | Admitting: Cardiovascular Disease

## 2018-10-21 ENCOUNTER — Other Ambulatory Visit: Payer: Medicare Other

## 2018-10-21 ENCOUNTER — Other Ambulatory Visit: Payer: Self-pay

## 2018-10-21 ENCOUNTER — Ambulatory Visit (INDEPENDENT_AMBULATORY_CARE_PROVIDER_SITE_OTHER): Payer: Medicare Other | Admitting: Cardiovascular Disease

## 2018-10-21 ENCOUNTER — Ambulatory Visit (INDEPENDENT_AMBULATORY_CARE_PROVIDER_SITE_OTHER): Payer: Medicare Other | Admitting: *Deleted

## 2018-10-21 ENCOUNTER — Ambulatory Visit: Payer: Medicare Other | Admitting: Cardiovascular Disease

## 2018-10-21 VITALS — BP 100/64 | HR 70 | Ht 72.0 in | Wt 178.8 lb

## 2018-10-21 DIAGNOSIS — M79672 Pain in left foot: Secondary | ICD-10-CM | POA: Diagnosis not present

## 2018-10-21 DIAGNOSIS — Z5181 Encounter for therapeutic drug level monitoring: Secondary | ICD-10-CM | POA: Diagnosis not present

## 2018-10-21 DIAGNOSIS — E785 Hyperlipidemia, unspecified: Secondary | ICD-10-CM | POA: Diagnosis not present

## 2018-10-21 DIAGNOSIS — Z951 Presence of aortocoronary bypass graft: Secondary | ICD-10-CM | POA: Diagnosis not present

## 2018-10-21 DIAGNOSIS — I4891 Unspecified atrial fibrillation: Secondary | ICD-10-CM

## 2018-10-21 DIAGNOSIS — I255 Ischemic cardiomyopathy: Secondary | ICD-10-CM | POA: Diagnosis not present

## 2018-10-21 DIAGNOSIS — Z01812 Encounter for preprocedural laboratory examination: Secondary | ICD-10-CM | POA: Diagnosis not present

## 2018-10-21 LAB — POCT INR: INR: 2.8 (ref 2.0–3.0)

## 2018-10-21 NOTE — Patient Instructions (Addendum)
Description   Continue taking 1 tablet daily.  Recheck INR 1 week post DCCV. Coumadin Clinic#[phone] Main [phone].

## 2018-10-21 NOTE — Patient Instructions (Signed)
Your physician recommends that you continue on your current medications as directed. Please refer to the Current Medication list given to you today.  Your physician recommends that you return for lab work in:  [place] has recommended that you have a Cardioversion (DCCV). Electrical Cardioversion uses a jolt of electricity to your heart either through paddles or wired patches attached to your chest. This is a controlled, usually prescheduled, procedure. Defibrillation is done under light anesthesia in the hospital, and you usually go home the day of the procedure. This is done to get your heart back into a normal rhythm. You are not awake for the procedure. Please see the instruction sheet given to you today.

## 2018-10-21 NOTE — Telephone Encounter (Signed)
° ° °  Patient would like to know what mediations can be taken for pain after surgery

## 2018-10-22 ENCOUNTER — Telehealth: Payer: Self-pay | Admitting: Cardiovascular Disease

## 2018-10-22 ENCOUNTER — Other Ambulatory Visit (HOSPITAL_COMMUNITY)
Admission: RE | Admit: 2018-10-22 | Discharge: 2018-10-22 | Disposition: A | Discharge status: Home / Self Care | Payer: Medicare Other | Source: Ambulatory Visit | Attending: Cardiology | Admitting: Cardiology

## 2018-10-22 DIAGNOSIS — Z20828 Contact with and (suspected) exposure to other viral communicable diseases: Secondary | ICD-10-CM | POA: Diagnosis not present

## 2018-10-22 DIAGNOSIS — Z01812 Encounter for preprocedural laboratory examination: Secondary | ICD-10-CM | POA: Insufficient documentation

## 2018-10-22 DIAGNOSIS — H2513 Age-related nuclear cataract, bilateral: Secondary | ICD-10-CM | POA: Diagnosis not present

## 2018-10-22 LAB — HEPATIC FUNCTION PANEL
ALT: 12 IU/L (ref 0–44)
AST: 19 IU/L (ref 0–40)
Albumin: 4.2 g/dL (ref 3.7–4.7)
Alkaline Phosphatase: 96 IU/L (ref 39–117)
Bilirubin Total: 2.3 mg/dL — ABNORMAL HIGH (ref 0.0–1.2)
Bilirubin, Direct: 0.26 mg/dL (ref 0.00–0.40)
Total Protein: 6.4 g/dL (ref 6.0–8.5)

## 2018-10-22 LAB — LIPID PANEL
Chol/HDL Ratio: 2.8 ratio (ref 0.0–5.0)
Cholesterol, Total: 104 mg/dL (ref 100–199)
HDL: 37 mg/dL — ABNORMAL LOW (ref >39)
LDL Chol Calc (NIH): 49 mg/dL (ref 0–99)
Triglycerides: 91 mg/dL (ref 0–149)
VLDL Cholesterol Cal: 18 mg/dL (ref 5–40)

## 2018-10-22 LAB — CBC WITH DIFFERENTIAL/PLATELET
Basophils Absolute: 0 10*3/uL (ref 0.0–0.2)
Basos: 0 %
EOS (ABSOLUTE): 0 10*3/uL (ref 0.0–0.4)
Eos: 1 %
Hematocrit: 43.1 % (ref 37.5–51.0)
Hemoglobin: 14.2 g/dL (ref 13.0–17.7)
Immature Grans (Abs): 0 10*3/uL (ref 0.0–0.1)
Immature Granulocytes: 0 %
Lymphocytes Absolute: 1.3 10*3/uL (ref 0.7–3.1)
Lymphs: 21 %
MCH: 30 pg (ref 26.6–33.0)
MCHC: 32.9 g/dL (ref 31.5–35.7)
MCV: 91 fL (ref 79–97)
Monocytes Absolute: 0.6 10*3/uL (ref 0.1–0.9)
Monocytes: 9 %
Neutrophils Absolute: 4.3 10*3/uL (ref 1.4–7.0)
Neutrophils: 69 %
Platelets: 132 10*3/uL — ABNORMAL LOW (ref 150–450)
RBC: 4.73 x10E6/uL (ref 4.14–5.80)
RDW: 14.1 % (ref 11.6–15.4)
WBC: 6.3 10*3/uL (ref 3.4–10.8)

## 2018-10-22 LAB — BASIC METABOLIC PANEL
BUN/Creatinine Ratio: 15 (ref 10–24)
BUN: 20 mg/dL (ref 8–27)
CO2: 27 mmol/L (ref 20–29)
Calcium: 8.6 mg/dL (ref 8.6–10.2)
Chloride: 102 mmol/L (ref 96–106)
Creatinine, Ser: 1.3 mg/dL — ABNORMAL HIGH (ref 0.76–1.27)
GFR calc Af Amer: 61 mL/min/{1.73_m2} (ref >59)
GFR calc non Af Amer: 53 mL/min/{1.73_m2} — ABNORMAL LOW (ref >59)
Glucose: 90 mg/dL (ref 65–99)
Potassium: 4.5 mmol/L (ref 3.5–5.2)
Sodium: 141 mmol/L (ref 134–144)

## 2018-10-22 LAB — SARS CORONAVIRUS 2 (TAT 6-24 HRS): SARS Coronavirus 2: NEGATIVE

## 2018-10-22 MED ORDER — SERTRALINE HCL 50 MG PO TABS: 50.0000 mg | ORAL_TABLET | Freq: Every day | ORAL | 3 refills | Status: DC

## 2018-10-22 NOTE — Telephone Encounter (Signed)
Can call in zoloft 50 mg daily he needs a primary care doctor !!!

## 2018-10-22 NOTE — Telephone Encounter (Signed)
New message   Patient needs to know what type of medication that he would need for depression and if needed patient would like a prescription sent to [facility]. Patient states that he does not want prozac. Please advise

## 2018-10-22 NOTE — Telephone Encounter (Signed)
LEFT DETAILED MESSAGE SCRIPT SENT VIA Epic FOR GEN ZOLOFT 50 MG 1 DAILY PER DR NISHAN./CY

## 2018-10-22 NOTE — Telephone Encounter (Signed)
Lpm with advisement. 9/2

## 2018-10-23 ENCOUNTER — Telehealth: Payer: Self-pay

## 2018-10-23 NOTE — Telephone Encounter (Signed)
The patient has been notified of the result and verbalized understanding.  All questions (if any) were answered. Wilma Flavin, RN 10/23/2018 9:15 AM

## 2018-10-23 NOTE — Progress Notes (Signed)
Spoke with patient and wife about procedure tomorrow. Been at home quarantined, answered all questions and reminded to be here @ 0830.

## 2018-10-24 ENCOUNTER — Telehealth: Payer: Self-pay | Admitting: *Deleted

## 2018-10-24 ENCOUNTER — Other Ambulatory Visit: Payer: Self-pay

## 2018-10-24 ENCOUNTER — Ambulatory Visit (HOSPITAL_COMMUNITY)
Admission: RE | Admit: 2018-10-24 | Discharge: 2018-10-24 | Disposition: A | Discharge status: Home / Self Care | Payer: Medicare Other | Attending: Cardiology | Admitting: Cardiology

## 2018-10-24 ENCOUNTER — Ambulatory Visit (HOSPITAL_COMMUNITY): Payer: Medicare Other | Admitting: Certified Registered"

## 2018-10-24 ENCOUNTER — Encounter (HOSPITAL_COMMUNITY)
Admission: RE | Disposition: A | Discharge status: Home / Self Care | Payer: Medicare Other | Source: Home / Self Care | Attending: Cardiology

## 2018-10-24 ENCOUNTER — Other Ambulatory Visit: Payer: Self-pay | Admitting: *Deleted

## 2018-10-24 ENCOUNTER — Encounter (HOSPITAL_COMMUNITY): Payer: Self-pay | Admitting: Certified Registered"

## 2018-10-24 DIAGNOSIS — E785 Hyperlipidemia, unspecified: Secondary | ICD-10-CM | POA: Diagnosis not present

## 2018-10-24 DIAGNOSIS — I2510 Atherosclerotic heart disease of native coronary artery without angina pectoris: Secondary | ICD-10-CM | POA: Diagnosis not present

## 2018-10-24 DIAGNOSIS — I5043 Acute on chronic combined systolic (congestive) and diastolic (congestive) heart failure: Secondary | ICD-10-CM | POA: Diagnosis not present

## 2018-10-24 DIAGNOSIS — Z87891 Personal history of nicotine dependence: Secondary | ICD-10-CM | POA: Insufficient documentation

## 2018-10-24 DIAGNOSIS — I480 Paroxysmal atrial fibrillation: Secondary | ICD-10-CM | POA: Diagnosis not present

## 2018-10-24 DIAGNOSIS — I5042 Chronic combined systolic (congestive) and diastolic (congestive) heart failure: Secondary | ICD-10-CM | POA: Insufficient documentation

## 2018-10-24 DIAGNOSIS — I4891 Unspecified atrial fibrillation: Secondary | ICD-10-CM | POA: Diagnosis not present

## 2018-10-24 DIAGNOSIS — Z7901 Long term (current) use of anticoagulants: Secondary | ICD-10-CM | POA: Insufficient documentation

## 2018-10-24 DIAGNOSIS — Z951 Presence of aortocoronary bypass graft: Secondary | ICD-10-CM | POA: Diagnosis not present

## 2018-10-24 DIAGNOSIS — R609 Edema, unspecified: Secondary | ICD-10-CM | POA: Insufficient documentation

## 2018-10-24 DIAGNOSIS — I4819 Other persistent atrial fibrillation: Secondary | ICD-10-CM

## 2018-10-24 DIAGNOSIS — I252 Old myocardial infarction: Secondary | ICD-10-CM | POA: Insufficient documentation

## 2018-10-24 DIAGNOSIS — I340 Nonrheumatic mitral (valve) insufficiency: Secondary | ICD-10-CM | POA: Insufficient documentation

## 2018-10-24 DIAGNOSIS — Z79899 Other long term (current) drug therapy: Secondary | ICD-10-CM | POA: Insufficient documentation

## 2018-10-24 DIAGNOSIS — I110 Hypertensive heart disease with heart failure: Secondary | ICD-10-CM | POA: Diagnosis not present

## 2018-10-24 HISTORY — PX: CARDIOVERSION: SHX1299

## 2018-10-24 SURGERY — CARDIOVERSION
Anesthesia: General

## 2018-10-24 MED ORDER — LIDOCAINE 2% (20 MG/ML) 5 ML SYRINGE
INTRAMUSCULAR | Status: DC | PRN
  Administered 2018-10-24: 20 mg via INTRAVENOUS

## 2018-10-24 MED ORDER — SODIUM CHLORIDE 0.9 % IV SOLN
INTRAVENOUS | Status: AC | PRN
  Administered 2018-10-24: 500 mL via INTRAMUSCULAR

## 2018-10-24 MED ORDER — PROPOFOL 10 MG/ML IV BOLUS
INTRAVENOUS | Status: DC | PRN
  Administered 2018-10-24: 60 mg via INTRAVENOUS

## 2018-10-24 NOTE — Telephone Encounter (Signed)
Pt aware of recommendations and agrees to see EP for consult ./cy

## 2018-10-24 NOTE — Anesthesia Postprocedure Evaluation (Signed)
Anesthesia Post Note  Patient: Jason Day  Procedure(s) Performed: CARDIOVERSION (N/A )     Patient location during evaluation: Endoscopy Anesthesia Type: General Level of consciousness: awake and alert, patient cooperative and oriented Pain management: pain level controlled Vital Signs Assessment: post-procedure vital signs reviewed and stable Respiratory status: spontaneous breathing, nonlabored ventilation and respiratory function stable Cardiovascular status: blood pressure returned to baseline and stable Postop Assessment: no apparent nausea or vomiting Anesthetic complications: no    Last Vitals:  Vitals:   10/24/18 0842 10/24/18 0925  BP: (!) 152/82 (!) 116/52  Pulse: (!) 57 (!) 53  Resp: 19 19  Temp: (!) 36.4 C 36.7 C  SpO2: 100% 96%    Last Pain:  Vitals:   10/24/18 0925  TempSrc: Oral  PainSc: 0-No pain                 Kyndall Chaplin,E. [place]

## 2018-10-24 NOTE — Discharge Instructions (Signed)

## 2018-10-24 NOTE — Transfer of Care (Signed)
Immediate Anesthesia Transfer of Care Note  Patient: Jason Day  Procedure(s) Performed: CARDIOVERSION (N/A )  Patient Location: Endoscopy Unit  Anesthesia Type:MAC  Level of Consciousness: drowsy  Airway & Oxygen Therapy: Patient Spontanous Breathing  Post-op Assessment: Report given to RN  Post vital signs: Reviewed and stable  Last Vitals:  Vitals Value Taken Time  BP    Temp    Pulse    Resp    SpO2      Last Pain:  Vitals:   10/24/18 0842  TempSrc: Temporal  PainSc: 0-No pain         Complications: No apparent anesthesia complications

## 2018-10-24 NOTE — CV Procedure (Signed)
    Electrical Cardioversion Procedure Note Jason Day [id number] Jul 16, 1940  Procedure: Electrical Cardioversion Indications:  Atrial Fibrillation  Time Out: Verified patient identification, verified procedure,medications/allergies/relevent history reviewed, required imaging and test results available.  Performed  Procedure Details  The patient was NPO after midnight. Anesthesia was administered at the beside  by Dr.Jackson with  propofol.  Cardioversion was performed with synchronized biphasic defibrillation via AP pads with 120, 200, 200 joules.  3 attempt(s) were performed.  The patient FAILED to convert to normal sinus rhythm. The patient tolerated the procedure well   IMPRESSION:  Unsuccessful cardioversion of atrial fibrillation. AFIB remains well rate controlled in the 50's.    Jason Day 10/24/2018, 9:28 AM

## 2018-10-24 NOTE — Anesthesia Preprocedure Evaluation (Signed)
Anesthesia Evaluation  Patient identified by MRN, date of birth, ID band Patient awake    Reviewed: Allergy & Precautions, NPO status , Patient's Chart, lab work & pertinent test results  History of Anesthesia Complications Negative for: history of anesthetic complications  Airway Mallampati: II  TM Distance: >3 FB Neck ROM: Full    Dental  (+) Dental Advisory Given, Chipped   Pulmonary former smoker,  10/22/2018 SARS coronavirus NEG   breath sounds clear to auscultation       Cardiovascular hypertension, Pt. on medications and Pt. on home beta blockers (-) angina+ CAD and + CABG  + dysrhythmias Atrial Fibrillation  Rhythm:Irregular Rate:Normal  04/2018 ECHO: EF 30-35%, basal inferior aneurysm, mod MR   Neuro/Psych negative neurological ROS     GI/Hepatic negative GI ROS, Neg liver ROS,   Endo/Other    Renal/GU Renal InsufficiencyRenal disease     Musculoskeletal   Abdominal   Peds  Hematology Coumadin: INR 2.8   Anesthesia Other Findings   Reproductive/Obstetrics                             Anesthesia Physical Anesthesia Plan  ASA: III  Anesthesia Plan: General   Post-op Pain Management:    Induction: Intravenous  PONV Risk Score and Plan: 2 and Treatment may vary due to age or medical condition  Airway Management Planned: Natural Airway and Mask  Additional Equipment:   Intra-op Plan:   Post-operative Plan:   Informed Consent: I have reviewed the patients History and Physical, chart, labs and discussed the procedure including the risks, benefits and alternatives for the proposed anesthesia with the patient or authorized representative who has indicated his/her understanding and acceptance.     Dental advisory given  Plan Discussed with: CRNA and Surgeon  Anesthesia Plan Comments:         Anesthesia Quick Evaluation

## 2018-10-24 NOTE — Interval H&P Note (Signed)
History and Physical Interval Note:  10/24/2018 9:05 AM  Jason Day  has presented today for surgery, with the diagnosis of afib.  The various methods of treatment have been discussed with the patient and family. After consideration of risks, benefits and other options for treatment, the patient has consented to  Procedure(s): CARDIOVERSION (N/A) as a surgical intervention.  The patient's history has been reviewed, patient examined, no change in status, stable for surgery.  I have reviewed the patient's chart and labs.  Questions were answered to the patient's satisfaction.     [employer]

## 2018-10-24 NOTE — Anesthesia Procedure Notes (Signed)
Procedure Name: MAC Date/Time: 10/24/2018 9:13 AM Performed by: Annye Asa, MD Pre-anesthesia Checklist: Patient identified, Emergency Drugs available, Suction available, Patient being monitored and Timeout performed Patient Re-evaluated:Patient Re-evaluated prior to induction Oxygen Delivery Method: Simple face mask

## 2018-10-24 NOTE — Telephone Encounter (Signed)
-----   Message from Josue Hector, MD sent at 10/24/2018 10:19 AM EDT ----- Needs f/u with EP failed [facility] on amiodarone or afib clinic would be fine

## 2018-10-26 ENCOUNTER — Encounter (HOSPITAL_COMMUNITY): Payer: Self-pay | Admitting: Cardiology

## 2018-10-29 ENCOUNTER — Encounter (HOSPITAL_COMMUNITY): Payer: Self-pay

## 2018-10-29 ENCOUNTER — Ambulatory Visit (HOSPITAL_COMMUNITY): Admit: 2018-10-29 | Payer: Medicare Other | Admitting: Cardiovascular Disease

## 2018-10-29 SURGERY — CARDIOVERSION
Anesthesia: General

## 2018-10-30 ENCOUNTER — Other Ambulatory Visit: Payer: Self-pay | Admitting: Nurse Practitioner

## 2018-11-10 DIAGNOSIS — H2511 Age-related nuclear cataract, right eye: Secondary | ICD-10-CM | POA: Diagnosis not present

## 2018-11-10 DIAGNOSIS — H25811 Combined forms of age-related cataract, right eye: Secondary | ICD-10-CM | POA: Diagnosis not present

## 2018-11-10 DIAGNOSIS — H25011 Cortical age-related cataract, right eye: Secondary | ICD-10-CM | POA: Diagnosis not present

## 2018-11-12 DIAGNOSIS — Z471 Aftercare following joint replacement surgery: Secondary | ICD-10-CM | POA: Diagnosis not present

## 2018-11-12 DIAGNOSIS — Z96612 Presence of left artificial shoulder joint: Secondary | ICD-10-CM | POA: Diagnosis not present

## 2018-11-19 ENCOUNTER — Telehealth: Payer: Self-pay

## 2018-11-19 ENCOUNTER — Encounter: Payer: Self-pay | Admitting: Internal Medicine

## 2018-11-19 ENCOUNTER — Other Ambulatory Visit: Payer: Self-pay

## 2018-11-19 ENCOUNTER — Telehealth (INDEPENDENT_AMBULATORY_CARE_PROVIDER_SITE_OTHER): Payer: Medicare Other | Admitting: Internal Medicine

## 2018-11-19 VITALS — Ht 72.0 in | Wt 175.0 lb

## 2018-11-19 DIAGNOSIS — Z961 Presence of intraocular lens: Secondary | ICD-10-CM | POA: Diagnosis not present

## 2018-11-19 DIAGNOSIS — I340 Nonrheumatic mitral (valve) insufficiency: Secondary | ICD-10-CM | POA: Diagnosis not present

## 2018-11-19 DIAGNOSIS — H2512 Age-related nuclear cataract, left eye: Secondary | ICD-10-CM | POA: Diagnosis not present

## 2018-11-19 DIAGNOSIS — I4819 Other persistent atrial fibrillation: Secondary | ICD-10-CM | POA: Diagnosis not present

## 2018-11-19 DIAGNOSIS — I5042 Chronic combined systolic (congestive) and diastolic (congestive) heart failure: Secondary | ICD-10-CM

## 2018-11-19 DIAGNOSIS — I38 Endocarditis, valve unspecified: Secondary | ICD-10-CM

## 2018-11-19 DIAGNOSIS — Z951 Presence of aortocoronary bypass graft: Secondary | ICD-10-CM | POA: Diagnosis not present

## 2018-11-19 NOTE — Telephone Encounter (Signed)
Patient scheduled in coumadin clinic tomorrow. Will transition at that time.

## 2018-11-19 NOTE — Telephone Encounter (Signed)
Left voicemail for patient to return call. Need to schedule him next available coumadin clinic appointment in order to switch him from coumadin to Xarelto.

## 2018-11-19 NOTE — Telephone Encounter (Signed)
We will need to know his INR prior to switching.  Sonia Baller is he having any pre-op labs done soon? If not we can get him on the schedule in the coumadin clinic

## 2018-11-19 NOTE — Progress Notes (Signed)
Electrophysiology TeleHealth Note   Due to national recommendations of social distancing due to [place] 19, Audio telehealth visit is felt to be most appropriate for this patient at this time.  Verbal consent was obtained for the visit today   Date:  11/19/2018   ID:  Jason Day, DOB 11/15/1940, MRN [id number]  Location: home  Provider location: [place] Evaluation Performed: New patient consult  PCP:  Patient, No Pcp Per  Cardiologist:  Jenkins Rouge, MD  Electrophysiologist:  None   Chief Complaint:  afib  History of Present Illness:    Jason Day is a 78 y.o. male who presents via audio conferencing for a telehealth visit today.   The patient is referred for new consultation regarding afib by Dr Johnsie Cancel.   He reports initially being diagnosed with atrial fibrillation 2 years ago.  He was placed on amiodarone and coumadin for afib management.  He somehow discontinued his amiodarone and returned in July of this year in afib.  He has developed worsening congestive heart failure.  He was returned to amiodarone as underwent cardioversion 10/24/2018.  This was not successful.   The patient is referred for EP consultation.  He has some fatigue.  He has developed L>R leg edema and weight gain.  This was felt to be worsening CHF due to afib by Dr Johnsie Cancel.  He has improved with diuresis.  Today, he denies symptoms of palpitations, chest pain, dizziness, presyncope, syncope, bleeding, or neurologic sequela. The patient is tolerating medications without difficulties and is otherwise without complaint today.    Past Medical History:  Diagnosis Date  . Bleeding hemorrhoids   . Chronic combined systolic and diastolic heart failure ([place])   . Chronic sinus complaints    overuses afrin  . Coronary atherosclerosis of native coronary artery   . Hyperlipidemia   . Ischemic cardiomyopathy   . Left atrial enlargement   . Moderate mitral regurgitation   . Myocardial infarction ([place]) 09/2015  .  Persistent atrial fibrillation   . RBBB     Past Surgical History:  Procedure Laterality Date  . BACK SURGERY  ~2014   disc repair  . CARDIOVERSION N/A 10/24/2018   Procedure: CARDIOVERSION;  Surgeon: Jerline Pain, MD;  Location: [facility] ENDOSCOPY;  Service: Cardiovascular;  Laterality: N/A;  . CHOLECYSTECTOMY    . COLONOSCOPY  04/06/2014   Hemorrhoids and diverticulosis performed in [place]  . CORONARY ARTERY BYPASS GRAFT N/A 10/18/2016   Procedure: CORONARY ARTERY BYPASS GRAFTING (CABG) x five , using left internal mammary artery and right leg greater saphenous vein harvested endoscopically;  Surgeon: Gaye Pollack, MD;  Location: [place] OR;  Service: Open Heart Surgery;  Laterality: N/A;  . FOOT SURGERY    . HEMORRHOID BANDING    . HEMORRHOID SURGERY    . REVERSE SHOULDER ARTHROPLASTY Left 08/21/2018   Procedure: REVERSE SHOULDER ARTHROPLASTY;  Surgeon: Justice Britain, MD;  Location: [facility] ORS;  Service: Orthopedics;  Laterality: Left;  . RIGHT/LEFT HEART CATH AND CORONARY ANGIOGRAPHY N/A 10/16/2016   Procedure: RIGHT/LEFT HEART CATH AND CORONARY ANGIOGRAPHY;  Surgeon: Belva Crome, MD;  Location: [place] CV LAB;  Service: Cardiovascular;  Laterality: N/A;  . stents in liver    . TEE WITHOUT CARDIOVERSION N/A 10/18/2016   Procedure: TRANSESOPHAGEAL ECHOCARDIOGRAM (TEE);  Surgeon: Gaye Pollack, MD;  Location: [place];  Service: Open Heart Surgery;  Laterality: N/A;    Current Outpatient Medications  Medication Sig Dispense Refill  . amiodarone (PACERONE) 200  MG tablet Take 1 tablet (200 mg total) by mouth daily. 90 tablet 3  . aspirin EC 81 MG tablet Take 81 mg by mouth daily.    Marland Kitchen atorvastatin (LIPITOR) 20 MG tablet Take 0.5 tablets (10 mg total) by mouth daily. 45 tablet 3  . digoxin (LANOXIN) 0.125 MG tablet Take 0.0625 mg by mouth daily.    . furosemide (LASIX) 40 MG tablet Take 1 tablet (40 mg total) by mouth daily. (Patient taking differently: Take 40 mg by mouth daily. Hold for days  he has activities) 90 tablet 3  . metoprolol succinate (TOPROL-XL) 25 MG 24 hr tablet Take 0.5 tablets (12.5 mg total) by mouth daily. (Patient taking differently: Take 12.5 mg by mouth every evening. ) 45 tablet 3  . oxymetazoline (AFRIN) 0.05 % nasal spray Place 1 spray into both nostrils at bedtime.    . sacubitril-valsartan (ENTRESTO) 49-51 MG Take 1 tablet by mouth 2 (two) times daily. 60 tablet 11  . sertraline (ZOLOFT) 50 MG tablet Take 1 tablet (50 mg total) by mouth daily. (Patient taking differently: Take 50 mg by mouth every evening. ) 90 tablet 3  . spironolactone (ALDACTONE) 25 MG tablet Take 1 tablet (25 mg total) by mouth daily. (Patient taking differently: Take 25 mg by mouth every evening. ) 90 tablet 3  . warfarin (COUMADIN) 5 MG tablet Take as directed by Coumadin Clinic (Patient taking differently: Take 5 mg by mouth every evening. ) 90 tablet 1   No current facility-administered medications for this visit.     Allergies:   Patient has no known allergies.   Social History:  The patient  reports that he quit smoking about 50 years ago. His smoking use included cigarettes. He smoked 0.50 packs per day. He has never used smokeless tobacco. He reports that he does not drink alcohol or use drugs.   Family History:  The patient's family history includes COPD in his father; Emphysema in his father; Healthy in his brother.    ROS:  Please see the history of present illness.   All other systems are personally reviewed and negative.    Exam:    Vital Signs:  Ht 6' (1.829 m)   Wt 175 lb (79.4 kg)   BMI 23.73 kg/m    Well sounding, alert and conversant    Labs/Other Tests and Data Reviewed:    Recent Labs: 07/28/2018: TSH 0.675 09/02/2018: NT-Pro BNP 5,646 10/21/2018: ALT 12; BUN 20; Creatinine, Ser 1.30; Hemoglobin 14.2; Platelets 132; Potassium 4.5; Sodium 141   Wt Readings from Last 3 Encounters:  11/19/18 175 lb (79.4 kg)  10/24/18 178 lb 9.2 oz (81 kg)  10/21/18 178  lb 12.8 oz (81.1 kg)     Other studies personally reviewed: Additional studies/ records that were reviewed today include: Dr Mariana Arn  Notes,  Echo  Review of the above records today demonstrates: as above   ASSESSMENT & PLAN:    1.  Persistent atrial fibrillation The patient has symptomatic, recurrent persistent atrial fibrillation with severe LA enlargement and moderate MR.  He also has EF 30%. he has failed medical therapy with amiodarone. Chads2vasc score is 4.  he is anticoagulated with coumadin . Therapeutic strategies for afib including chronic rate control and ablation were discussed in detail with the patient today. Risk, benefits, and alternatives to EP study and radiofrequency ablation for afib were also discussed in detail today. These risks include but are not limited to stroke, bleeding, vascular damage, tamponade, perforation, damage  to the esophagus, lungs, and other structures, pulmonary vein stenosis, worsening renal function, and death. The patient understands these risk and wishes to proceed.  We will therefore proceed with catheter ablation at the next available time.  Carto, ICE, anesthesia are requested for the procedure.  Will also obtain TEE prior to the procedure to exclude LAA thrombus and further evaluate atrial anatomy. Stop coumadin due to labile INRs and start xarelto prior to ablation.  2. Moderate MR We will obtain TEE prior to ablation to evaluate his MR  3. Ischemic CM/ chronic systolic dysfunction Given advanced age, I am not sure that he would be a candidate for ICD.  I will reassess with the patient after ablation.  Current medicines are reviewed at length with the patient today.   The patient does not have concerns regarding his medicines.  The following changes were made today:  none  Labs/ tests ordered today include:  No orders of the defined types were placed in this encounter.   Patient Risk:  after full review of this patients clinical status,  I feel that they are at high risk at this time.   Today, I have spent 20 minutes with the patient with telehealth technology discussing afib .    SignedThompson Grayer MD, [place] 11/19/2018 12:26 PM   [place] [address] [place] [address] [phone] (office) [phone] (fax)

## 2018-11-19 NOTE — Telephone Encounter (Signed)
-----   Message from Thompson Grayer, MD sent at 11/19/2018 12:36 PM EDT ----- Afib ablation C/I/A  TEE rather than CT.   Switch coumadin to xarelto prior to ablation

## 2018-11-20 ENCOUNTER — Ambulatory Visit (INDEPENDENT_AMBULATORY_CARE_PROVIDER_SITE_OTHER): Payer: Medicare Other | Admitting: *Deleted

## 2018-11-20 ENCOUNTER — Other Ambulatory Visit: Payer: Self-pay

## 2018-11-20 DIAGNOSIS — Z5181 Encounter for therapeutic drug level monitoring: Secondary | ICD-10-CM

## 2018-11-20 DIAGNOSIS — Z951 Presence of aortocoronary bypass graft: Secondary | ICD-10-CM | POA: Diagnosis not present

## 2018-11-20 LAB — POCT INR: INR: 4.2 — AB (ref 2.0–3.0)

## 2018-11-20 MED ORDER — RIVAROXABAN 20 MG PO TABS: 20.0000 mg | ORAL_TABLET | Freq: Every day | ORAL | 5 refills | Status: DC

## 2018-11-20 NOTE — Patient Instructions (Addendum)
Description   Stop taking Coumadin today and start taking Xarelto 20mg  (1 tablet) daily take with your biggest meal starting Saturday (10/3)

## 2018-11-20 NOTE — Progress Notes (Signed)
Called pt and unable to leave message, was calling to let him know that we called his pharmacy and were able to apply a Xeralto card so the first 30 days would be free.

## 2018-11-24 ENCOUNTER — Telehealth: Payer: Self-pay

## 2018-11-24 DIAGNOSIS — H25812 Combined forms of age-related cataract, left eye: Secondary | ICD-10-CM | POA: Diagnosis not present

## 2018-11-24 DIAGNOSIS — H2512 Age-related nuclear cataract, left eye: Secondary | ICD-10-CM | POA: Diagnosis not present

## 2018-11-24 DIAGNOSIS — H25012 Cortical age-related cataract, left eye: Secondary | ICD-10-CM | POA: Diagnosis not present

## 2018-11-24 NOTE — Telephone Encounter (Signed)
Afib ablation  C/I/A   TEE rather than CT.   Switch coumadin to xarelto prior to ablation   Call placed to Pt.  Pt started Xarelto 11/22/2018.  Will plan on scheduling ablation in 3 weeks.  Left dates for Pt for last week of October.  Will f/u.

## 2018-12-02 NOTE — Telephone Encounter (Signed)
Outreach made to Pt to schedule for afib ablation.  Per Pt he wants an in person visit with his wife to discuss.  He did not understand on the phone.  Will give to [facility] with scheduling to set up appt.

## 2018-12-22 DIAGNOSIS — M79672 Pain in left foot: Secondary | ICD-10-CM | POA: Diagnosis not present

## 2018-12-22 DIAGNOSIS — M79671 Pain in right foot: Secondary | ICD-10-CM | POA: Diagnosis not present

## 2018-12-22 DIAGNOSIS — M76821 Posterior tibial tendinitis, right leg: Secondary | ICD-10-CM | POA: Diagnosis not present

## 2018-12-22 DIAGNOSIS — I872 Venous insufficiency (chronic) (peripheral): Secondary | ICD-10-CM | POA: Diagnosis not present

## 2018-12-25 ENCOUNTER — Ambulatory Visit (INDEPENDENT_AMBULATORY_CARE_PROVIDER_SITE_OTHER): Payer: Medicare Other | Admitting: Internal Medicine

## 2018-12-25 ENCOUNTER — Other Ambulatory Visit: Payer: Self-pay

## 2018-12-25 ENCOUNTER — Encounter: Payer: Self-pay | Admitting: Internal Medicine

## 2018-12-25 VITALS — BP 110/68 | HR 68 | Ht 72.0 in | Wt 187.0 lb

## 2018-12-25 DIAGNOSIS — I4819 Other persistent atrial fibrillation: Secondary | ICD-10-CM

## 2018-12-25 DIAGNOSIS — I255 Ischemic cardiomyopathy: Secondary | ICD-10-CM

## 2018-12-25 DIAGNOSIS — I340 Nonrheumatic mitral (valve) insufficiency: Secondary | ICD-10-CM

## 2018-12-25 NOTE — Patient Instructions (Signed)
Medication Instructions:  Your physician recommends that you continue on your current medications as directed. Please refer to the Current Medication list given to you today.  Labwork: None ordered.  Testing/Procedures: None ordered.  Follow-Up: Your physician wants you to follow-up in: 3 months with the AFIB clinic.   You will receive a reminder letter in the mail two months in advance. If you don't receive a letter, please call our office to schedule the follow-up appointment.   Any Other Special Instructions Will Be Listed Below (If Applicable).  If you need a refill on your cardiac medications before your next appointment, please call your pharmacy.

## 2018-12-31 NOTE — Progress Notes (Signed)
PCP: Patient, No Pcp Per Primary Cardiologist: Dr Johnsie Cancel Primary EP: Dr Rayann Heman  Jason Day is a 78 y.o. male who presents today for routine electrophysiology followup.  Since last being seen in our clinic, the patient reports doing reasonably well.  He has fatigue but is otherwise stable.  Edema is stable. Today, he denies symptoms of palpitations, chest pain, shortness of breath, dizziness, presyncope, or syncope.  We had discussed ablation with virtual visit.  He is not sure that he wishes to proceed and therefore presents today for further discussion with his family. The patient is otherwise without complaint today.   Past Medical History:  Diagnosis Date  . Bleeding hemorrhoids   . Chronic combined systolic and diastolic heart failure ([place])   . Chronic sinus complaints    overuses afrin  . Coronary atherosclerosis of native coronary artery   . Hyperlipidemia   . Ischemic cardiomyopathy   . Left atrial enlargement   . Moderate mitral regurgitation   . Myocardial infarction ([place]) 09/2015  . Persistent atrial fibrillation ([place])   . RBBB    Past Surgical History:  Procedure Laterality Date  . BACK SURGERY  ~2014   disc repair  . CARDIOVERSION N/A 10/24/2018   Procedure: CARDIOVERSION;  Surgeon: Jerline Pain, MD;  Location: [facility] ENDOSCOPY;  Service: Cardiovascular;  Laterality: N/A;  . CHOLECYSTECTOMY    . COLONOSCOPY  04/06/2014   Hemorrhoids and diverticulosis performed in [place]  . CORONARY ARTERY BYPASS GRAFT N/A 10/18/2016   Procedure: CORONARY ARTERY BYPASS GRAFTING (CABG) x five , using left internal mammary artery and right leg greater saphenous vein harvested endoscopically;  Surgeon: Gaye Pollack, MD;  Location: [place] OR;  Service: Open Heart Surgery;  Laterality: N/A;  . FOOT SURGERY    . HEMORRHOID BANDING    . HEMORRHOID SURGERY    . REVERSE SHOULDER ARTHROPLASTY Left 08/21/2018   Procedure: REVERSE SHOULDER ARTHROPLASTY;  Surgeon: Justice Britain, MD;  Location: [facility] ORS;   Service: Orthopedics;  Laterality: Left;  . RIGHT/LEFT HEART CATH AND CORONARY ANGIOGRAPHY N/A 10/16/2016   Procedure: RIGHT/LEFT HEART CATH AND CORONARY ANGIOGRAPHY;  Surgeon: Belva Crome, MD;  Location: [place] CV LAB;  Service: Cardiovascular;  Laterality: N/A;  . stents in liver    . TEE WITHOUT CARDIOVERSION N/A 10/18/2016   Procedure: TRANSESOPHAGEAL ECHOCARDIOGRAM (TEE);  Surgeon: Gaye Pollack, MD;  Location: [place];  Service: Open Heart Surgery;  Laterality: N/A;    ROS- all systems are reviewed and negatives except as per HPI above  Current Outpatient Medications  Medication Sig Dispense Refill  . amiodarone (PACERONE) 200 MG tablet Take 1 tablet (200 mg total) by mouth daily. 90 tablet 3  . aspirin EC 81 MG tablet Take 81 mg by mouth daily.    Marland Kitchen atorvastatin (LIPITOR) 20 MG tablet Take 0.5 tablets (10 mg total) by mouth daily. 45 tablet 3  . digoxin (LANOXIN) 0.125 MG tablet Take 0.0625 mg by mouth daily.    . furosemide (LASIX) 40 MG tablet Take 40 mg by mouth as needed for fluid.    . metoprolol succinate (TOPROL-XL) 25 MG 24 hr tablet Take 0.5 tablets (12.5 mg total) by mouth daily. (Patient taking differently: Take 12.5 mg by mouth every evening. ) 45 tablet 3  . oxymetazoline (AFRIN) 0.05 % nasal spray Place 1 spray into both nostrils at bedtime.    . rivaroxaban (XARELTO) 20 MG TABS tablet Take 1 tablet (20 mg total) by mouth daily with supper.  30 tablet 5  . sacubitril-valsartan (ENTRESTO) 49-51 MG Take 1 tablet by mouth 2 (two) times daily. 60 tablet 11  . spironolactone (ALDACTONE) 25 MG tablet Take 1 tablet (25 mg total) by mouth daily. (Patient taking differently: Take 25 mg by mouth every evening. ) 90 tablet 3   No current facility-administered medications for this visit.     Physical Exam: Vitals:   12/25/18 1617  BP: 110/68  Pulse: 68  SpO2: 98%  Weight: 187 lb (84.8 kg)  Height: 6' (1.829 m)    GEN- The patient is well appearing, alert and oriented  x 3 today.   Head- normocephalic, atraumatic Eyes-  Sclera clear, conjunctiva pink Ears- hearing intact Oropharynx- clear Lungs-  normal work of breathing Heart- irregular rate and rhythm  GI- soft,  Extremities- no clubbing, cyanosis, + stable edema  Wt Readings from Last 3 Encounters:  12/25/18 187 lb (84.8 kg)  11/19/18 175 lb (79.4 kg)  10/24/18 178 lb 9.2 oz (81 kg)    EKG tracing ordered today is personally reviewed and shows afib, RBBB, LAD  Assessment and Plan:  1. Persistent afib He has medicine refractory afib in the setting of severe LA enlargement.  He is doing reasonably well for his advised age and has decided to not proceed with ablation.  He would prefer to continue rate control. I think that this is a reasonably approach. Continue coumadin  2. Moderate MR Stable  3. Ischemic CM/ chronic systolic dysfunction Medical therapy as per Dr Johnsie Cancel Given advanced age, he is not an ICD candidate  Follow-up with Dr Johnsie Cancel and with the AF clinic in 3 months I will see when needed  Thompson Grayer MD, [facility] 12/31/2018 9:19 PM

## 2019-01-12 ENCOUNTER — Telehealth: Payer: Self-pay | Admitting: Pharmacist

## 2019-01-12 NOTE — Telephone Encounter (Signed)
Patient called very upset that his [name] cost has gone up. He has been out for 2 days. Explained that he has hit the donut whole and it will reset in Jan. Patient states he will pick up refill but he wants off of this medication and wants something cheaper. He also said he wants off the Xarelto. Says that he will hit the donut whole again in June at this rate and he wants off. Advised that I will send message to Dr. Johnsie Cancel letting him know his concerns.

## 2019-01-12 NOTE — Telephone Encounter (Addendum)
Spoke with Pam as patient has 10 days of xarelto left. Advised patient overlap warfarin with Xarelto for 3 days. Will start warfarin on 11/30. 5mg  daily (previous dose). Last dose of Xarelto on 12/2. INR check scheduled on 12/7.  Take last dose of Entresto and start losartan when next dose would be due.

## 2019-01-12 NOTE — Telephone Encounter (Signed)
He can change to cozaar 50 mg instead of Entresto but his CHF may get worse He can switch over to coumadin from xarelto but he needs to understand the need to monitor  INRls which will irritate him more !!

## 2019-01-12 NOTE — Telephone Encounter (Signed)
Called patient back about Dr. Kyla Balzarine recommendations. Patient agreed to changes. Patient stated he has 10 xarelto tablets left and he was picking up a 30 day supply of entresto and will be out by the end of December. Informed patient that pharm D will be consulted about when to start each medications.  Per Marcelle Overlie Pharm D., patient will need to start Coumadin 3 days prior to his last tablet of xarelto. Patient will need to start on Coumadin 5 mg daily,  which was his last known dose before switching to xarelto. Patient can stop Entresto one day and start Cozaar the next day.  Called patient, and left message for patient to call back.

## 2019-01-13 ENCOUNTER — Telehealth: Payer: Self-pay | Admitting: Cardiovascular Disease

## 2019-01-13 NOTE — Addendum Note (Signed)
Addended by: Aris Georgia, Symon Norwood L on: 01/13/2019 01:55 PM   Modules accepted: Orders

## 2019-01-13 NOTE — Telephone Encounter (Signed)
Patient returned call. Informed patient of plan.   1-Patient is going to finish Xarelto taking it daily, then on the last three days, patient needs to start taking Coumadin 5 mg every day and then come in for an INR check on 01/26/19. Patient thinks he has 10 to 9 pills left of xarelto, but will call our office with the exact number. Informed patient that his appointment might change due to when he starts coumadin. Patient verbalized understanding. Patient stated he also has called an assistance program to help him pay for Xarelto.  2-Patient has called a patient assistance program for entresto and he will let our office know if he needs to switch to Losartan. Patient stated that he has at least 30 day supply.

## 2019-01-13 NOTE — Telephone Encounter (Signed)
Left message for patient to call.

## 2019-01-13 NOTE — Telephone Encounter (Signed)
Patient returning Pam's call from yesterday in regards to his medications.

## 2019-01-19 DIAGNOSIS — I890 Lymphedema, not elsewhere classified: Secondary | ICD-10-CM | POA: Diagnosis not present

## 2019-01-19 NOTE — Telephone Encounter (Signed)
Patient wants to talk to the pre auth nurse to discuss some paper work. Will send to her and see if patient can get medication assistance.

## 2019-01-19 NOTE — Telephone Encounter (Signed)
**Note De-Identified Deddrick Saindon Obfuscation** The pt is having a difficult time paying for his medications (mainly Xarelto and Entresto). He is asking questions concerning assistance for the cost of his meds. I gave him [employer] (For Xarelto) and [employer] (For [place]) Pt asst phone numbers to call with questions concerning his eligibility and to request that they mail him applications.  Also, he is aware that I am reaching out to our social workers to see if they can offer him some assistance.

## 2019-01-19 NOTE — Telephone Encounter (Signed)
Follow up:    Patient has been waiting on some to call him concering his medication. Please call patient.

## 2019-01-20 ENCOUNTER — Telehealth (HOSPITAL_COMMUNITY): Payer: Self-pay | Admitting: Licensed Clinical Social Worker

## 2019-01-20 NOTE — Telephone Encounter (Signed)
CSW consulted to speak with pt regarding medication cost concerns with Xarelto and Entresto.  Pt currently in the donut hole and his medication copays for these medications would be $139 for entresto and $120 for the Xarelto which would be unaffordable for pt at this time.  CSW emailed pt patient assistance applications for [employer] and [employer] [place]- informed him how to complete and need to return to MD office for physician portion to be completed and submitted for review- pt expressed understanding.  CSW also discussed copay grant for Entresto through PAN and placed pt on waitlist.  CSW updated MD office and requested they look into getting patient samples for patient to help him get through December as on 1/1 his copays will be reduced again.  CSW will continue to follow and assist as needed  [name], [facility] Desk#: [phone] Cell#: [phone]

## 2019-01-20 NOTE — Telephone Encounter (Signed)
**Note De-Identified Jason Day Obfuscation** The pt is advised that we are leaving him 2 bottles of Xarelto 20 mg samples and 1 bottle of Entresto 49-51 mg samples at our Covid-19 screening table in the downstairs lobby of Dr Mariana Arn office.  He is aware that the office has a limited amount of samples and they are meant for new starts and emergencies and that a change may need to be made in his medications due to cost.  He is appreciative of our help.

## 2019-02-03 ENCOUNTER — Telehealth: Payer: Self-pay | Admitting: Cardiovascular Disease

## 2019-02-03 NOTE — Telephone Encounter (Signed)
Samples left at front test for patient to pick up.

## 2019-02-03 NOTE — Telephone Encounter (Signed)
  Patient calling the office for samples of medication:   1.  What medication and dosage are you requesting samples for? sacubitril-valsartan (ENTRESTO) 49-51 MG rivaroxaban (XARELTO) 20 MG TABS tablet  2.  Are you currently out of this medication? Yes

## 2019-02-03 NOTE — Telephone Encounter (Signed)
Called patient back to let him know we got his paper work. Will leave samples at front desk for patient to pick up to hold him over until paper work can be processed. Patient agreed to plan.

## 2019-02-03 NOTE — Telephone Encounter (Signed)
Follow-up:  Patient called this morning flustered because he left paper work with the check in desk last week and has not heard anything about it. He needed assistance paying for two different medications, Entresto and Xarelto. He says Pam and Jeani Hawking know all about it. If the paperwork is not done, he will need more samples of this medication, which have been requested in another encounter.

## 2019-02-09 ENCOUNTER — Other Ambulatory Visit: Payer: Self-pay

## 2019-02-09 ENCOUNTER — Telehealth: Payer: Self-pay

## 2019-02-09 DIAGNOSIS — I872 Venous insufficiency (chronic) (peripheral): Secondary | ICD-10-CM

## 2019-02-09 NOTE — Telephone Encounter (Signed)
We received Entresto Pt Asst application from pt. Dr Johnsie Cancel signed and it was faxed to [employer].

## 2019-02-10 ENCOUNTER — Ambulatory Visit (INDEPENDENT_AMBULATORY_CARE_PROVIDER_SITE_OTHER): Payer: Medicare Other | Admitting: Vascular Surgery

## 2019-02-10 ENCOUNTER — Other Ambulatory Visit: Payer: Self-pay

## 2019-02-10 ENCOUNTER — Encounter (HOSPITAL_COMMUNITY): Payer: Medicare Other

## 2019-02-10 ENCOUNTER — Encounter: Payer: Self-pay | Admitting: Vascular Surgery

## 2019-02-10 ENCOUNTER — Ambulatory Visit (HOSPITAL_COMMUNITY)
Admission: RE | Admit: 2019-02-10 | Discharge: 2019-02-10 | Disposition: A | Discharge status: Home / Self Care | Payer: Medicare Other | Source: Ambulatory Visit | Attending: Family | Admitting: Family

## 2019-02-10 ENCOUNTER — Encounter: Payer: Medicare Other | Admitting: Vascular Surgery

## 2019-02-10 VITALS — BP 126/77 | HR 66 | Temp 97.9°F | Resp 20 | Ht 72.0 in | Wt 179.3 lb

## 2019-02-10 DIAGNOSIS — I255 Ischemic cardiomyopathy: Secondary | ICD-10-CM

## 2019-02-10 DIAGNOSIS — I872 Venous insufficiency (chronic) (peripheral): Secondary | ICD-10-CM | POA: Insufficient documentation

## 2019-02-10 NOTE — Progress Notes (Signed)
Vascular and Vein Specialist of [facility]  Patient name: Jason Day MRN: [id number] DOB: 1940-04-10 Sex: male  REASON FOR CONSULT: Evaluation of pain and swelling bilateral lower extremities  HPI: [name] is a 78 y.o. male, who is here today for evaluation of pain and swelling in both lower extremities left greater than right.  He is here today with his wife.  He reports multiple components of this.  He does have what sounds like neuropathy.  He describes numbness in a stocking distribution particularly on the bottom of his feet.  Does have swelling in both lower extremities left greater than right.  He does not have any history of lower extremity tissue loss.  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  He had vein harvest from his right leg.  Past Medical History:  Diagnosis Date  . Bleeding hemorrhoids   . Chronic combined systolic and diastolic heart failure ([place])   . Chronic sinus complaints    overuses afrin  . Coronary atherosclerosis of native coronary artery   . Hyperlipidemia   . Ischemic cardiomyopathy   . Left atrial enlargement   . Moderate mitral regurgitation   . Myocardial infarction ([place]) 09/2015  . Persistent atrial fibrillation ([place])   . RBBB     Family History  Problem Relation Age of Onset  . COPD Father   . Emphysema Father   . Healthy Brother   . Diabetes Neg Hx   . Cancer Neg Hx   . Stomach cancer Neg Hx   . Colon cancer Neg Hx     SOCIAL HISTORY: Social History   Socioeconomic History  . Marital status: Married    Spouse name: Not on file  . Number of children: 2  . Years of education: Not on file  . Highest education level: Not on file  Occupational History  . Occupation: Web designer    Comment: Retired  Tobacco Use  . Smoking status: Former Smoker    Packs/day: 0.50    Types: Cigarettes    Quit date: 1970    Years since quitting: 51.0  . Smokeless tobacco: Never  Used  . Tobacco comment: Smoked on and off  Substance and Sexual Activity  . Alcohol use: No  . Drug use: No  . Sexual activity: Yes  Other Topics Concern  . Not on file  Social History Narrative   Married    Originally from [place] moved to [place] age 72 moved to [place]   1 son   1 estranged daughter      Has living will   Wife is health care POA--then son   Would accept resuscitation   No tube feeds if cognitively unaware   Social Determinants of Health   Financial Resource Strain:   . Difficulty of Paying Living Expenses: Not on file  Food Insecurity:   . Worried About Charity fundraiser in the Last Year: Not on file  . Ran Out of Food in the Last Year: Not on file  Transportation Needs:   . Lack of Transportation (Medical): Not on file  . Lack of Transportation (Non-Medical): Not on file  Physical Activity:   . Days of Exercise per Week: Not on file  . Minutes of Exercise per Session: Not on file  Stress:   . Feeling of Stress : Not on file  Social Connections:   . Frequency of Communication with Friends and Family: Not on file  . Frequency of  Social Gatherings with Friends and Family: Not on file  . Attends Religious Services: Not on file  . Active Member of Clubs or Organizations: Not on file  . Attends Archivist Meetings: Not on file  . Marital Status: Not on file  Intimate Partner Violence:   . Fear of Current or Ex-Partner: Not on file  . Emotionally Abused: Not on file  . Physically Abused: Not on file  . Sexually Abused: Not on file    No Known Allergies  Current Outpatient Medications  Medication Sig Dispense Refill  . amiodarone (PACERONE) 200 MG tablet Take 1 tablet (200 mg total) by mouth daily. 90 tablet 3  . aspirin EC 81 MG tablet Take 81 mg by mouth daily.    Marland Kitchen atorvastatin (LIPITOR) 20 MG tablet Take 0.5 tablets (10 mg total) by mouth daily. 45 tablet 3  . digoxin (LANOXIN) 0.125 MG tablet Take 0.0625 mg by mouth  daily.    . metoprolol succinate (TOPROL-XL) 25 MG 24 hr tablet Take 0.5 tablets (12.5 mg total) by mouth daily. (Patient taking differently: Take 12.5 mg by mouth every evening. ) 45 tablet 3  . oxymetazoline (AFRIN) 0.05 % nasal spray Place 1 spray into both nostrils at bedtime.    . rivaroxaban (XARELTO) 20 MG TABS tablet Take 1 tablet (20 mg total) by mouth daily with supper. 30 tablet 5  . sacubitril-valsartan (ENTRESTO) 49-51 MG Take 1 tablet by mouth 2 (two) times daily. 60 tablet 11  . spironolactone (ALDACTONE) 25 MG tablet Take 1 tablet (25 mg total) by mouth daily. (Patient taking differently: Take 25 mg by mouth every evening. ) 90 tablet 3  . furosemide (LASIX) 40 MG tablet Take 40 mg by mouth as needed for fluid.     No current facility-administered medications for this visit.    REVIEW OF SYSTEMS:  [X]  denotes positive finding, [ ]  denotes negative finding Cardiac  Comments:  Chest pain or chest pressure:    Shortness of breath upon exertion:    Short of breath when lying flat:    Irregular heart rhythm:        Vascular    Pain in calf, thigh, or hip brought on by ambulation:    Pain in feet at night that wakes you up from your sleep:     Blood clot in your veins:    Leg swelling:         Pulmonary    Oxygen at home:    Productive cough:     Wheezing:         Neurologic    Sudden weakness in arms or legs:     Sudden numbness in arms or legs:     Sudden onset of difficulty speaking or slurred speech:    Temporary loss of vision in one eye:     Problems with dizziness:         Gastrointestinal    Blood in stool:     Vomited blood:         Genitourinary    Burning when urinating:     Blood in urine:        Psychiatric    Major depression:         Hematologic    Bleeding problems:    Problems with blood clotting too easily:        Skin    Rashes or ulcers:        Constitutional    Fever or chills:  PHYSICAL EXAM: Vitals:   02/10/19 1430    BP: 126/77  Pulse: 66  Resp: 20  Temp: 97.9 F (36.6 C)  SpO2: 99%  Weight: 179 lb 4.8 oz (81.3 kg)  Height: 6' (1.829 m)    GENERAL: The patient is a well-nourished male, in no acute distress. The vital signs are documented above. CARDIOVASCULAR: 2+ radial and 2+ posterior tibial pulses bilaterally.  Changes consistent with venous hypertension with hemosiderin deposits bilaterally left greater than right with swelling left greater than right PULMONARY: There is good air exchange  ABDOMEN: Soft and non-tender  MUSCULOSKELETAL: There are no major deformities or cyanosis. NEUROLOGIC: No focal weakness or paresthesias are detected. SKIN: There are no ulcers or rashes noted. PSYCHIATRIC: The patient has a normal affect.  DATA:  Lower extremity arterial studies from today were reviewed showing normal ankle arm index and normal triphasic waveforms bilaterally  MEDICAL ISSUES: A long discussion with the patient and his wife present.  I do feel that he has no evidence of arterial insufficiency.  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  I also feel that he has chronic venous hypertension causing his swelling.  He has worn compression garments and is able to have these fitted since he has a zipper style.  He reports some discomfort while wearing these for extended period of time.  I did stress the importance of elevation and compression when possible.  He will continue to try compression and elevation.  He will see [place] again on an as-needed basis   Rosetta Posner, MD [facility] Vascular and Vein Specialists of [facility] Tel [phone] Pager [phone]

## 2019-02-23 ENCOUNTER — Telehealth: Payer: Self-pay | Admitting: Cardiovascular Disease

## 2019-02-23 ENCOUNTER — Telehealth (HOSPITAL_COMMUNITY): Payer: Self-pay | Admitting: Licensed Clinical Social Worker

## 2019-02-23 MED ORDER — RIVAROXABAN 20 MG PO TABS: 20.0000 mg | ORAL_TABLET | Freq: Every day | ORAL | 11 refills | Status: DC

## 2019-02-23 NOTE — Telephone Encounter (Signed)
Patient assistance forms located. Will leave at front desk for patient to pick up tomorrow. Made copies for our records. Patient will mail off our portion of form with his portion on [place] [employer] [place]. We have faxed [employer] form.

## 2019-02-23 NOTE — Telephone Encounter (Signed)
Refill has already been sent to pharmacy earlier today, samples will need to be addressed by samples/refill dept.

## 2019-02-23 NOTE — Telephone Encounter (Signed)
**Note De-Identified Bergen Melle Obfuscation** The pt is upset and stating that he needs his 2 pt asst applications back. I advised him that his [employer] pt asst application for his [name] was faxed to [employer] on 02/09/2019 after Dr Johnsie Cancel signed it and that I am unaware of a [place] and [place] application for his Xarelto. He states that he did not want his [employer] or [employer] faxed in as he wants to mail/send them in to the pt asst programs himself although he admits that he did not request that when he left his applications at the office.  He states that Dr Mariana Arn nurse Jeannene Patella told him she received his [employer] and [employer] (see phone note from 01/20/2019).  He is aware that I am forwarding this message to April Garrison, CMA to ask if she will mail the pt his [employer] pt asst application if she can locate it and to [facility] to address Wynetta Emery and [employer] application.  The pt is again asking for Xarelto and [employer]is samples (He has received more than we normally allow) so the samples given today will be all he will be able to get so a change in his meds may need to be readdressed.

## 2019-02-23 NOTE — Telephone Encounter (Signed)
  Patient calling the office for samples of medication:   1.  What medication and dosage are you requesting samples for? rivaroxaban (XARELTO) 20 MG TABS tablet  2.  Are you currently out of this medication? almost

## 2019-02-23 NOTE — Telephone Encounter (Signed)
  Patient is calling to follow up with Jeani Hawking Via regarding his medication authorization

## 2019-02-23 NOTE — Telephone Encounter (Signed)
CSW received notification that PAN foundation was now open to apply to by those on the waitlist.  CSW called and left a message for the pt to inform and to complete the application process.  When CSW spoke with pt he informed CSW that he had received the notification in his email and had already called [employer] and signed himself up for the grant.  Pt then inquired about his Xarelto patient assistance program which he had turned into his Cardiologist office ([place])- CSW sent message to office to inquire.  CSW will continue to follow and assist as needed.  [name], LCSW Clinical Social Worker [facility] Desk#: [phone] Cell#: [phone]

## 2019-02-23 NOTE — Telephone Encounter (Signed)
Left message for patient to call back  

## 2019-02-23 NOTE — Telephone Encounter (Signed)
Patient called back. Patient stated he turned in two asst applications. One for [employer] and one for [employer].  Will check on paper work here at the office.

## 2019-02-24 MED ORDER — RIVAROXABAN 20 MG PO TABS: 20.0000 mg | ORAL_TABLET | Freq: Every day | ORAL | 5 refills | Status: DC

## 2019-02-24 NOTE — Telephone Encounter (Signed)
Rx was sent in by MD's RN. Will send in correct refill to pharmacy.

## 2019-02-24 NOTE — Telephone Encounter (Signed)
This medication was not sent into pt's pharmacy, it on print, could you please resend this medication to pt's pharmacy. thanks

## 2019-02-24 NOTE — Telephone Encounter (Addendum)
**Note De-Identified Vernadette Stutsman Obfuscation** We will place 1 sample bottle of Entresto 49-51 mg and 2 sample bottles of Xarelto 20 mg with his [place] [employer] [place] and [employer] pt asst applications (as the pt states that he is almost out) that he is picking up from office today.

## 2019-02-26 ENCOUNTER — Ambulatory Visit (INDEPENDENT_AMBULATORY_CARE_PROVIDER_SITE_OTHER): Payer: Medicare Other | Admitting: Podiatry

## 2019-02-26 ENCOUNTER — Encounter: Payer: Self-pay | Admitting: Podiatry

## 2019-02-26 ENCOUNTER — Other Ambulatory Visit: Payer: Self-pay

## 2019-02-26 DIAGNOSIS — B351 Tinea unguium: Secondary | ICD-10-CM

## 2019-02-26 DIAGNOSIS — M7751 Other enthesopathy of right foot: Secondary | ICD-10-CM

## 2019-02-26 DIAGNOSIS — D689 Coagulation defect, unspecified: Secondary | ICD-10-CM

## 2019-02-26 NOTE — Progress Notes (Signed)
  Subjective:  Patient ID: Jason Day, male    DOB: 1940/05/20,  MRN: [id number]  Chief Complaint  Patient presents with  . Nail Problem    pt is here to get a nail trim, as well as to look at the sore spot on his right foot lateral side.     79 y.o. male presents with the above complaint. History confirmed with patient. States it has been bothering him for about a week, tried heating pad without relief.  Objective:  Physical Exam: warm, good capillary refill, nail exam onychomycosis of the toenails, no trophic changes or ulcerative lesions, normal DP and PT pulses, and normal sensory exam. Left Foot: normal exam, no swelling, tenderness, instability; ligaments intact, full range of motion of all ankle/foot joints  Right Foot: tenderness of the 5th metatarsal head   No images are attached to the encounter.  Assessment:  1. Onychomycosis   2. Coagulation defect ([place])   3. Capsulitis of metatarsophalangeal (MTP) joint of right foot     Plan:  Patient was evaluated and treated and all questions answered.  Capsulitis, Onychomycosis, and Coagulation Defect -Nails palliatively debrided secondary to pain -Injection delivered to the right 5th MPJ  Procedure: Joint Injection Location: Right 5th MP joint Skin Prep: Alcohol. Injectate: 0.5 cc 1% lidocaine plain, 0.5 cc dexamethasone phosphate. Disposition: Patient tolerated procedure well. Injection site dressed with a band-aid.  Procedure: Nail Debridement Rationale: Patient meets criteria for routine foot care due to coagulation defect Type of Debridement: manual, sharp debridement. Instrumentation: Nail nipper, rotary burr. Number of Nails: 10   Return in about 3 months (around 05/27/2019) for Routine foot care (on anticoagulant).

## 2019-02-27 ENCOUNTER — Telehealth (HOSPITAL_COMMUNITY): Payer: Self-pay | Admitting: Licensed Clinical Social Worker

## 2019-02-27 NOTE — Telephone Encounter (Signed)
CSW received notification from PAN foundation that pt has been approved for the copay grant to help with Entresto costs  Approval dates: 11/29/18- 02/26/2020  Pharmacy card details not available online so informed pt if he does not get a copay card in the mail we will need to call the PAN foundation to get those details so he can provide to pharmacy  CSW will continue to follow and assist as needed  [name], [place] Worker [facility] Desk#: [phone] Cell#: [phone]

## 2019-03-02 ENCOUNTER — Telehealth (HOSPITAL_COMMUNITY): Payer: Self-pay | Admitting: Licensed Clinical Social Worker

## 2019-03-02 NOTE — Telephone Encounter (Signed)
CSW received call from pt requesting help figuring out how to sign up for his COVID vaccine.  CSW provided pt with information from the public health department including their COVID-19 phone line and the link to online registration for those 75 years and up  CSW will continue to follow and assist as needed  [name], [place] Worker [facility] Desk#: [phone] Cell#: [phone]

## 2019-03-15 ENCOUNTER — Ambulatory Visit: Payer: Medicare Other | Attending: Internal Medicine

## 2019-03-15 DIAGNOSIS — Z23 Encounter for immunization: Secondary | ICD-10-CM | POA: Insufficient documentation

## 2019-03-15 NOTE — Progress Notes (Signed)
   Covid-19 Vaccination Clinic  Name:  Jason Day    MRN: [id number] DOB: 1940-11-25  03/15/2019  [name] was observed post Covid-19 immunization for 30 minutes based on pre-vaccination screening without incidence. He was provided with Vaccine Information Sheet and instruction to access the V-Safe system.   [name] was instructed to call 911 with any severe reactions post vaccine: Marland Kitchen Difficulty breathing  . Swelling of your face and throat  . A fast heartbeat  . A bad rash all over your body  . Dizziness and weakness    Immunizations Administered    Name Date Dose VIS Date Route   Pfizer COVID-19 Vaccine 03/15/2019 10:54 AM 0.3 mL 01/30/2019 Intramuscular   Manufacturer: [place]   Lot: [id number]   [place]: [id number]

## 2019-03-23 ENCOUNTER — Telehealth: Payer: Self-pay

## 2019-03-23 NOTE — Telephone Encounter (Signed)
**Note De-Identified Sharlee Rufino Obfuscation** Letter received from [employer] pt asst program Hridaan Bouse fax stating that they have approved the pt for asst with Entresto. Approval good for the remainder of the year (02/19/20). Pt ID: [id number]  The letter states that they have notified the pt of this approval.

## 2019-04-06 ENCOUNTER — Ambulatory Visit: Payer: Medicare Other | Attending: Internal Medicine

## 2019-04-06 DIAGNOSIS — Z23 Encounter for immunization: Secondary | ICD-10-CM

## 2019-04-06 NOTE — Progress Notes (Signed)
   Covid-19 Vaccination Clinic  Name:  Jason Day    MRN: [id number] DOB: 08-26-1940  04/06/2019  [name] was observed post Covid-19 immunization for 15 minutes without incidence. He was provided with Vaccine Information Sheet and instruction to access the V-Safe system.   [name] was instructed to call 911 with any severe reactions post vaccine: Marland Kitchen Difficulty breathing  . Swelling of your face and throat  . A fast heartbeat  . A bad rash all over your body  . Dizziness and weakness    Immunizations Administered    Name Date Dose VIS Date Route   Pfizer COVID-19 Vaccine 04/06/2019  8:28 AM 0.3 mL 01/30/2019 Intramuscular   Manufacturer: [place]   Lot: [id number]   [place]: [id number]

## 2019-04-07 ENCOUNTER — Telehealth: Payer: Self-pay | Admitting: Licensed Clinical Social Worker

## 2019-04-07 NOTE — Telephone Encounter (Signed)
CSW received call from pt in regards to [facility] and [place] application for [employer].   Pt reports that he heard back from [employer] that he would have to spend about $1,600 out of pocket before he would qualify for assistance which he states wouldn't happen until the very end of the year meaning it is essentially worthless to him.  Right now pt is paying about $50/month for Xarelto which is around the average price so don't think there are any other options for him to make more affordable.  Pt is not interested in remaining on Xarelto due to cost and would like to switch back to coumadin- CSW informed pt he needs to call his MD at [facility] before making any of these changes as they would have to transition him back to coumadin and ensure he is on the right dose- CSW also messaged provider office to inform of pt wish to switch back to coumadin.  CSW encouraged pt to reach out if he has any further concerns  [name], [facility] Desk#: [phone] Cell#: [phone]

## 2019-05-13 ENCOUNTER — Telehealth: Payer: Self-pay | Admitting: Cardiovascular Disease

## 2019-05-13 NOTE — Telephone Encounter (Signed)
Pt is calling to speak with Pam RN and Dr. Johnsie Cancel, to schedule his next follow-up appointment for sometime in April or May, for he states "I need a good looking over by Dr. Johnsie Cancel, and have updated labs and testing done." Pt has no cardiac complaints at this time, but states "its been sometime since I saw him for follow-up, and I want to make sure everything with my heart is where it needs to be."  Offered to assist the pt in scheduling him a follow-up visit with Dr. Johnsie Cancel at noted open slots, and pt states "I just want Pam to call me back and make this."  Pt states "I am in no hurry, but just have her call back when she returns to the office." Informed the pt that I will route this message to [facility] RN for further review and follow-up with the pt, upon return to the office.  Pt verbalized understanding and agrees with this plan.  Pt very appreciative for the offer to schedule him.

## 2019-05-13 NOTE — Telephone Encounter (Signed)
Patient calling to discuss his medications with Pam. He did not give any further information.

## 2019-05-14 NOTE — Telephone Encounter (Signed)
Patient will come in on 06/05/19 for follow up.

## 2019-05-27 DIAGNOSIS — K648 Other hemorrhoids: Secondary | ICD-10-CM | POA: Diagnosis not present

## 2019-05-29 ENCOUNTER — Ambulatory Visit: Payer: Medicare Other | Admitting: Podiatry

## 2019-05-29 NOTE — Progress Notes (Signed)
Cardiology Office Note    Date:  06/05/2019   ID:  Jason Day, DOB 1940/10/14, MRN [id number]  PCP:  Patient, No Pcp Per  Cardiologist:  Dr. Johnsie Cancel   Chief Complaint: Edema / CHF   History of Present Illness:   Jason Day is a 79 y.o. male CAD s/p CABG, ICM, PAF, moderate MR and HLD seen in f/u   He initially presented 09/2016 w/ acute pulmonary edema and diuresed. Echo showed reduced LVEF, down to 30%, leading to LHC, which showed severe multivessel CAD. 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 Post of afib >> on amiodarone and coumadin. He is claustrophobic and cannot have MRI.   Last echo 04/2018 showed LVEF of 30-35%, grade 2 DD,  WM abnormality with mural thrombus.   The patient had L rotator cuff arthropathy 08/21/18.   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 Bleeding hemorrhoids   . Chronic combined systolic and diastolic heart failure ([place])   . Chronic sinus complaints    overuses afrin  . Coronary atherosclerosis of native coronary artery   . Hyperlipidemia   . Ischemic cardiomyopathy   . Left atrial enlargement   . Moderate mitral regurgitation   . Myocardial infarction ([place]) 09/2015  . Persistent atrial fibrillation ([place])   . RBBB     Past Surgical History:  Procedure Laterality Date  . BACK SURGERY  ~2014   disc repair  . CARDIOVERSION N/A 10/24/2018   Procedure: CARDIOVERSION;  Surgeon: Jerline Pain, MD;  Location: [facility] ENDOSCOPY;  Service: Cardiovascular;  Laterality: N/A;  . CHOLECYSTECTOMY    . COLONOSCOPY  04/06/2014   Hemorrhoids and diverticulosis performed in [place]  . CORONARY ARTERY BYPASS GRAFT N/A 10/18/2016   Procedure: CORONARY ARTERY BYPASS  GRAFTING (CABG) x five , using left internal mammary artery and right leg greater saphenous vein harvested endoscopically;  Surgeon: Gaye Pollack, MD;  Location: [facility] OR;  Service: Open Heart Surgery;  Laterality: N/A;  . FOOT SURGERY    . HEMORRHOID BANDING    . HEMORRHOID SURGERY    . REVERSE SHOULDER ARTHROPLASTY Left 08/21/2018   Procedure: REVERSE SHOULDER ARTHROPLASTY;  Surgeon: Justice Britain, MD;  Location: [facility] ORS;  Service: Orthopedics;  Laterality: Left;  . RIGHT/LEFT HEART CATH AND CORONARY ANGIOGRAPHY N/A 10/16/2016   Procedure: RIGHT/LEFT HEART CATH AND CORONARY ANGIOGRAPHY;  Surgeon: Belva Crome, MD;  Location: [facility] CV LAB;  Service: Cardiovascular;  Laterality: N/A;  . stents in liver    . TEE WITHOUT CARDIOVERSION N/A 10/18/2016   Procedure: TRANSESOPHAGEAL ECHOCARDIOGRAM (TEE);  Surgeon: Gaye Pollack, MD;  Location: [facility];  Service: Open Heart Surgery;  Laterality: N/A;    Current Medications:  Prior to Admission medications   Medication Sig Start Date End Date Taking? Authorizing Provider  atorvastatin (LIPITOR) 20 MG tablet Take 0.5 tablets (10 mg total) by mouth daily. Patient taking differently: Take 10 mg by mouth every evening.  07/29/18  Yes Josue Hector, MD  digoxin (LANOXIN) 0.125 MG tablet Take 0.5 tablets (62.5 mcg total) by mouth daily. 06/26/18  Yes Josue Hector, MD  metoprolol succinate (TOPROL-XL) 25 MG 24 hr tablet  Take 0.5 tablets (12.5 mg total) by mouth daily. Patient taking differently: Take 12.5 mg by mouth at bedtime.  06/26/18  Yes Josue Hector, MD  sacubitril-valsartan (ENTRESTO) 49-51 MG Take 1 tablet by mouth 2 (two) times daily. 07/29/18  Yes Josue Hector, MD  spironolactone (ALDACTONE) 25 MG tablet Take 0.5 tablets (12.5 mg total) by mouth daily. Patient taking differently: Take 12.5 mg by mouth every evening.  06/26/18  Yes Josue Hector, MD  warfarin (COUMADIN) 5 MG tablet Take as directed by [facility] Patient taking  differently: Take 5 mg by mouth every evening. Take as directed by [facility] 06/26/18  Yes Josue Hector, MD   Allergies:   Patient has no known allergies.   Social History   Socioeconomic History  . Marital status: Married    Spouse name: Not on file  . Number of children: 2  . Years of education: Not on file  . Highest education level: Not on file  Occupational History  . Occupation: Web designer    Comment: Retired  Tobacco Use  . Smoking status: Former Smoker    Packs/day: 0.50    Types: Cigarettes    Quit date: 1970    Years since quitting: 51.3  . Smokeless tobacco: Never Used  . Tobacco comment: Smoked on and off  Substance and Sexual Activity  . Alcohol use: No  . Drug use: No  . Sexual activity: Yes  Other Topics Concern  . Not on file  Social History Narrative   Married    Originally from [place] moved to [place] age 70 moved to [place]   1 son   1 estranged daughter      Has living will   Wife is health care POA--then son   Would accept resuscitation   No tube feeds if cognitively unaware   Social Determinants of Health   Financial Resource Strain:   . Difficulty of Paying Living Expenses:   Food Insecurity:   . Worried About Charity fundraiser in the Last Year:   . Arboriculturist in the Last Year:   Transportation Needs:   . Film/video editor (Medical):   Marland Kitchen Lack of Transportation (Non-Medical):   Physical Activity:   . Days of Exercise per Week:   . Minutes of Exercise per Session:   Stress:   . Feeling of Stress :   Social Connections:   . Frequency of Communication with Friends and Family:   . Frequency of Social Gatherings with Friends and Family:   . Attends Religious Services:   . Active Member of Clubs or Organizations:   . Attends Archivist Meetings:   Marland Kitchen Marital Status:      Family History:  The patient's family history includes COPD in his father; Emphysema in his father; Healthy in  his brother.   ROS:   Please see the history of present illness.    ROS All other systems reviewed and are negative.   PHYSICAL EXAM:   VS:  BP (!) 108/58   Pulse 78   Ht 6' (1.829 m)   Wt 180 lb (81.6 kg)   SpO2 95%   BMI 24.41 kg/m     Affect appropriate Healthy:  appears stated age 21: normal Neck supple with no adenopathy JVP normal no bruits no thyromegaly Lungs clear with no wheezing and good diaphragmatic motion Heart:  S1/S2 no murmur, no rub, gallop or click PMI normal Abdomen: benighn,  BS positve, no tenderness, no AAA no bruit.  No HSM or HJR Distal pulses intact with no bruits Plus one edema bad varicosities bilaterally chronic stasis  Neuro non-focal Skin warm and dry No muscular weakness   Wt Readings from Last 3 Encounters:  06/05/19 180 lb (81.6 kg)  02/10/19 179 lb 4.8 oz (81.3 kg)  12/25/18 187 lb (84.8 kg)      Studies/Labs Reviewed:   EKG:  12/25/18 afib RBBB LAD old IMI   Recent Labs: 07/28/2018: TSH 0.675 09/02/2018: NT-Pro BNP 5,646 10/21/2018: ALT 12; BUN 20; Creatinine, Ser 1.30; Hemoglobin 14.2; Platelets 132; Potassium 4.5; Sodium 141   Lipid Panel    Component Value Date/Time   CHOL 104 10/21/2018 1117   TRIG 91 10/21/2018 1117   HDL 37 (L) 10/21/2018 1117   CHOLHDL 2.8 10/21/2018 1117   CHOLHDL 6 04/22/2017 1431   VLDL 9 10/15/2016 1155   LDLCALC 49 10/21/2018 1117   LDLDIRECT 119.0 04/22/2017 1431    Additional studies/ records that were reviewed today include:   Echocardiogram: 04/2018  1. The left ventricle has moderate-severely reduced systolic function, with an ejection fraction of 30-35%. The cavity size was mildly dilated. There is moderately increased left ventricular wall thickness. Left ventricular diastolic Doppler parameters  are consistent with pseudonormalization. Basal inferoseptal akinesis, basal inferior and basal inferolateral aneurysm. Mid inferior and mid inferolateral akinesis. 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  2. Left atrial size was moderately dilated.  3. Right atrial size was mildly dilated.  4. There is mild mitral annular calcification present. Mitral valve regurgitation is moderate by color flow Doppler. Suspect infarct-related MR in the setting of inferior/inferolateral wall motion abnormalities. No evidence of mitral valve stenosis.  5. The aortic valve is tricuspid Mild calcification of the aortic valve. no stenosis of the aortic valve.  6. There is mild dilatation of the ascending aorta measuring 40 mm.  7. The IVC was normal in size. PA systolic pressure 39 mmHg.    ASSESSMENT & PLAN:    1. Acute on chronic combined CHF  - LVEF of 30-35% by last echo 04/2018. On excellent Rx with digoxin, lasix, aldactone Toprol and entresto will update echo since meds optimized   2. CAD s/p CABG -Recent EKG with nonspecific changes.  Patient denies any anginal symptoms.  Should be on 81 mg ASA   3 PAF - on xarelto  some issues affording DOAC Failed cardioversion and medical Rx with amiodarone Seen by Dr Rayann Heman 12/25/18 and ablation deferred adopted strategy of rate control and anticoagulation LA dimension by echo 51 mm  5.  Moderate mitral regurgitation -ischemic f/u echo ordered see above   6. Edema:  Discussed seeing vein doctors at [facility] Compression stockings and diuretic with elevation of legs   Medication Adjustments/Labs and Tests Ordered:   Signed, Jenkins Rouge, MD  06/05/2019 10:51 AM    [facility] [address], [place], [place]  [address] Phone: [phone]; Fax: [phone]

## 2019-06-05 ENCOUNTER — Encounter: Payer: Self-pay | Admitting: Cardiovascular Disease

## 2019-06-05 ENCOUNTER — Other Ambulatory Visit: Payer: Self-pay

## 2019-06-05 ENCOUNTER — Ambulatory Visit (INDEPENDENT_AMBULATORY_CARE_PROVIDER_SITE_OTHER): Payer: Medicare Other | Admitting: Cardiovascular Disease

## 2019-06-05 VITALS — BP 108/58 | HR 78 | Ht 72.0 in | Wt 180.0 lb

## 2019-06-05 DIAGNOSIS — I340 Nonrheumatic mitral (valve) insufficiency: Secondary | ICD-10-CM | POA: Diagnosis not present

## 2019-06-05 DIAGNOSIS — I255 Ischemic cardiomyopathy: Secondary | ICD-10-CM

## 2019-06-05 DIAGNOSIS — I480 Paroxysmal atrial fibrillation: Secondary | ICD-10-CM

## 2019-06-05 DIAGNOSIS — I5043 Acute on chronic combined systolic (congestive) and diastolic (congestive) heart failure: Secondary | ICD-10-CM | POA: Diagnosis not present

## 2019-06-05 NOTE — Patient Instructions (Signed)
Medication Instructions:  °*If you need a refill on your cardiac medications before your next appointment, please call your pharmacy* ° °Lab Work: °If you have labs (blood work) drawn today and your tests are completely normal, you will receive your results only by: °• MyChart Message (if you have MyChart) OR °• A paper copy in the mail °If you have any lab test that is abnormal or we need to change your treatment, we will call you to review the results. ° °Testing/Procedures: °Your physician has requested that you have an echocardiogram. Echocardiography is a painless test that uses sound waves to create images of your heart. It provides your doctor with information about the size and shape of your heart and how well your heart’s chambers and valves are working. This procedure takes approximately one hour. There are no restrictions for this procedure. ° °Follow-Up: °At [facility], you and your health needs are our priority.  As part of our continuing mission to provide you with exceptional heart care, we have created designated Provider Care Teams.  These Care Teams include your primary Cardiologist (physician) and Advanced Practice Providers (APPs -  Physician Assistants and Nurse Practitioners) who all work together to provide you with the care you need, when you need it. ° °We recommend signing up for the patient portal called "MyChart".  Sign up information is provided on this After Visit Summary.  MyChart is used to connect with patients for Virtual Visits (Telemedicine).  Patients are able to view lab/test results, encounter notes, upcoming appointments, etc.  Non-urgent messages can be sent to your provider as well.   °To learn more about what you can do with MyChart, go to [email or web address].com.   ° °Your next appointment:   °6 month(s) ° °The format for your next appointment:   °In Person ° °Provider:   °You may see Peter Nishan, MD or one of the following Advanced Practice Providers on your  designated Care Team:   °· Lori Gerhardt, NP °· Laura Ingold, NP °· Jill McDaniel, NP ° ° ° °

## 2019-06-24 ENCOUNTER — Other Ambulatory Visit: Payer: Self-pay

## 2019-06-24 ENCOUNTER — Ambulatory Visit (HOSPITAL_COMMUNITY): Payer: Medicare Other | Attending: Cardiology

## 2019-06-24 DIAGNOSIS — I480 Paroxysmal atrial fibrillation: Secondary | ICD-10-CM | POA: Diagnosis not present

## 2019-06-24 DIAGNOSIS — I255 Ischemic cardiomyopathy: Secondary | ICD-10-CM

## 2019-06-24 DIAGNOSIS — I340 Nonrheumatic mitral (valve) insufficiency: Secondary | ICD-10-CM

## 2019-06-24 DIAGNOSIS — I5043 Acute on chronic combined systolic (congestive) and diastolic (congestive) heart failure: Secondary | ICD-10-CM | POA: Diagnosis not present

## 2019-06-24 MED ORDER — PERFLUTREN LIPID MICROSPHERE
1.0000 mL | INTRAVENOUS | Status: AC | PRN
  Administered 2019-06-24: 2 mL via INTRAVENOUS

## 2019-06-30 ENCOUNTER — Telehealth: Payer: Self-pay | Admitting: Cardiovascular Disease

## 2019-06-30 NOTE — Telephone Encounter (Signed)
   Pt is returning call from [facility] regarding echo results

## 2019-06-30 NOTE — Telephone Encounter (Signed)
Josue Hector, MD  Michaelyn Barter, RN  EF about same 30-35% ischemic DCM MR just mild f/u [facility] who has seen before to discuss AICD    Patient aware of results. Will send message to EP scheduler.

## 2019-07-06 DIAGNOSIS — K625 Hemorrhage of anus and rectum: Secondary | ICD-10-CM | POA: Diagnosis not present

## 2019-07-07 ENCOUNTER — Telehealth: Payer: Self-pay | Admitting: Cardiovascular Disease

## 2019-07-07 NOTE — Telephone Encounter (Signed)
Patient states that he would like to talk to [facility], Dr. Kyla Balzarine nurse. He states that he trusts her and Dr. Kyla Balzarine advice. He says that he is losing a lot of blood in his stool. He saw his doctor last week about this issue but they referred him to the wrong doctor. He is aware that Pam is off today but says it will be okay to discuss with her tomorrow.

## 2019-07-08 NOTE — Telephone Encounter (Signed)
Patient called back. Patient stated he was concerned about bright red blood coming form his rectum after bowel movements for 3 days. Patient's bleeding stopped on Sunday night and patient decide to stop his xarelto on Monday without consulting doctor.  Patient saw a GI doctor yesterday who has suggested an endoscopy and referral to another GI doctor. Patient wanted advise about this. Informed patient that he should not stop xarelto without consulting a doctor. Encourage patient to follow GI doctor directions and get testing done. Will forward to Dr. Johnsie Cancel for further advisement.

## 2019-07-08 NOTE — Telephone Encounter (Signed)
Patient called back. Informed patient of Dr. Kyla Balzarine recommendations. Patient verbalized understanding.

## 2019-07-08 NOTE — Telephone Encounter (Signed)
Left message for patient to call back  

## 2019-07-08 NOTE — Telephone Encounter (Signed)
AGree ok to stop 2 days before any endoscopy though

## 2019-07-09 ENCOUNTER — Other Ambulatory Visit: Payer: Self-pay | Admitting: Cardiovascular Disease

## 2019-07-09 DIAGNOSIS — K625 Hemorrhage of anus and rectum: Secondary | ICD-10-CM | POA: Diagnosis not present

## 2019-07-10 ENCOUNTER — Other Ambulatory Visit: Payer: Self-pay | Admitting: Gastroenterology

## 2019-07-12 ENCOUNTER — Other Ambulatory Visit: Payer: Self-pay | Admitting: Cardiovascular Disease

## 2019-07-13 ENCOUNTER — Telehealth: Payer: Self-pay | Admitting: *Deleted

## 2019-07-13 ENCOUNTER — Telehealth: Payer: Self-pay | Admitting: Pharmacist

## 2019-07-13 ENCOUNTER — Other Ambulatory Visit: Payer: Self-pay

## 2019-07-13 MED ORDER — DIGOXIN 125 MCG PO TABS: 0.0625 mg | ORAL_TABLET | Freq: Every day | ORAL | 3 refills | Status: DC

## 2019-07-13 NOTE — Telephone Encounter (Signed)
Outpatient Medication Detail   Disp Refills Start End   digoxin (LANOXIN) 0.125 MG tablet 45 tablet 3 07/13/2019    Sig - Route: Take 0.5 tablets (0.0625 mg total) by mouth daily. - Oral   Sent to pharmacy as: digoxin (LANOXIN) 0.125 MG tablet   E-Prescribing Status: Receipt confirmed by pharmacy (07/13/2019 12:27 PM EDT)   Pharmacy  [id number] - [place], [place] - [address]

## 2019-07-13 NOTE — Telephone Encounter (Signed)
    [facility] Pre-operative Risk Assessment    HEARTCARE STAFF: - Please ensure there is not already an duplicate clearance open for this procedure. - Under Visit Info/Reason for Call, type in Other and utilize the format Clearance MM/DD/YY or Clearance TBD. Do not use dashes or single digits. - If request is for dental extraction, please clarify the # of teeth to be extracted.  Request for surgical clearance:  1. What type of surgery is being performed? COLONOSCOPY/ENDOSCOPY   2. When is this surgery scheduled? 08/04/19   3. What type of clearance is required (medical clearance vs. Pharmacy clearance to hold med vs. Both)? BOTH  4. Are there any medications that need to be held prior to surgery and how long? [place]   5. Practice name and name of physician performing surgery? [facility]; DR. Penelope Coop   6. What is the office phone number? [phone]   7.   What is the office fax number?  [phone]  8.   Anesthesia type (None, local, MAC, general) ? NOT LISTED   Jason Day 07/13/2019, 4:16 PM  _________________________________________________________________   (provider comments below)

## 2019-07-13 NOTE — Telephone Encounter (Signed)
Received an angry voicemail at 11:53 today about refilling patients medications. Patient requesting call. Will forward to refills as we do not prescribe anything for this patient.

## 2019-07-13 NOTE — Telephone Encounter (Signed)
Pt's medication was sent to pt's pharmacy as requested. Confirmation received.  °

## 2019-07-13 NOTE — Telephone Encounter (Signed)
Called pt and left message informing pt that his medication digoxin was sent to his pharmacy as requested for a year supply and if he has any other problems, questions or concerns, to give our office a call back.

## 2019-07-14 NOTE — Telephone Encounter (Signed)
Patient with diagnosis of afib on Xarelto for anticoagulation.    Procedure: COLONOSCOPY/ENDOSCOPY Date of procedure: 08/04/19  CHADS2-VASc score of  4 (CHF, AGE, CAD, AGE)  CrCl 51 ml/min  Per office protocol, patient can hold Xarelto for 1-2 days prior to procedure.

## 2019-07-14 NOTE — Telephone Encounter (Signed)
Can you please comment on how long Xarelto needs to be held for upcoming colonoscopy?  Thank you!

## 2019-07-14 NOTE — Telephone Encounter (Signed)
   Primary Cardiologist: Jenkins Rouge, MD  Chart reviewed as part of pre-operative protocol coverage. Given past medical history and time since last visit, based on ACC/AHA guidelines, Jason Day would be at acceptable risk for the planned procedure without further cardiovascular testing.   Patient with diagnosis of afib on Xarelto for anticoagulation.    Procedure: COLONOSCOPY/ENDOSCOPY Date of procedure: 08/04/19  CHADS2-VASc score of  4 (CHF, AGE, CAD, AGE)  CrCl 51 ml/min  Per office protocol, patient can hold Xarelto for 1-2 days prior to procedure.  I will route this recommendation to the requesting party via Epic fax function and remove from pre-op pool.  Please call with questions.  Jossie Ng. Audelia Knape NP-C    07/14/2019, 1:30 PM [address] Office [phone] Fax [phone]

## 2019-07-21 DIAGNOSIS — M79642 Pain in left hand: Secondary | ICD-10-CM | POA: Diagnosis not present

## 2019-07-21 DIAGNOSIS — G5602 Carpal tunnel syndrome, left upper limb: Secondary | ICD-10-CM | POA: Diagnosis not present

## 2019-07-29 DIAGNOSIS — G5602 Carpal tunnel syndrome, left upper limb: Secondary | ICD-10-CM | POA: Diagnosis not present

## 2019-07-31 ENCOUNTER — Other Ambulatory Visit (HOSPITAL_COMMUNITY)
Admission: RE | Admit: 2019-07-31 | Discharge: 2019-07-31 | Disposition: A | Discharge status: Home / Self Care | Payer: Medicare Other | Source: Ambulatory Visit | Attending: Gastroenterology | Admitting: Gastroenterology

## 2019-07-31 DIAGNOSIS — Z01812 Encounter for preprocedural laboratory examination: Secondary | ICD-10-CM | POA: Insufficient documentation

## 2019-07-31 DIAGNOSIS — Z20822 Contact with and (suspected) exposure to covid-19: Secondary | ICD-10-CM | POA: Diagnosis not present

## 2019-07-31 DIAGNOSIS — G5602 Carpal tunnel syndrome, left upper limb: Secondary | ICD-10-CM | POA: Diagnosis not present

## 2019-07-31 LAB — SARS CORONAVIRUS 2 (TAT 6-24 HRS): SARS Coronavirus 2: NEGATIVE

## 2019-08-03 ENCOUNTER — Telehealth: Payer: Self-pay

## 2019-08-03 NOTE — Telephone Encounter (Signed)
   [facility] Pre-operative Risk Assessment    HEARTCARE STAFF: - Please ensure there is not already an duplicate clearance open for this procedure. - Under Visit Info/Reason for Call, type in Other and utilize the format Clearance MM/DD/YY or Clearance TBD. Do not use dashes or single digits. - If request is for dental extraction, please clarify the # of teeth to be extracted.  Request for surgical clearance:  1. What type of surgery is being performed? LEFT CARPAL RELEASE   2. When is this surgery scheduled? TBD   3. What type of clearance is required (medical clearance vs. Pharmacy clearance to hold med vs. Both)? BOTH  4. Are there any medications that need to be held prior to surgery and how long? XARELTO PLEASE ADVISE   5. Practice name and name of physician performing surgery? [facility]; DR. Jeneen Rinks [place]   6. What is the office phone number? [phone]   7.   What is the office fax number? [phone]  8.   Anesthesia type (None, local, MAC, general) ? MAC   Jacinta Shoe 08/03/2019, 3:53 PM  _________________________________________________________________   (provider comments below)

## 2019-08-03 NOTE — Anesthesia Preprocedure Evaluation (Addendum)
Anesthesia Evaluation  Patient identified by MRN, date of birth, ID band Patient awake    Reviewed: Allergy & Precautions, H&P , NPO status , Patient's Chart, lab work & pertinent test results  Airway Mallampati: I  TM Distance: >3 FB Neck ROM: Full    Dental no notable dental hx. (+) Teeth Intact, Dental Advisory Given   Pulmonary neg pulmonary ROS, former smoker,    Pulmonary exam normal breath sounds clear to auscultation       Cardiovascular Exercise Tolerance: Good + angina + CAD, + Past MI and +CHF  negative cardio ROS Normal cardiovascular exam+ dysrhythmias Atrial Fibrillation  Rhythm:Regular Rate:Normal  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 This appears to be smaller as compared to prior echo. Left ventricular ejection fraction, by estimation, is 30 to  35%. The left ventricle has moderately decreased function. The left ventricle demonstrates regional wall motion abnormalities. Definity contrast agent was given IV to delineate the left ventricular endocardial borders. The left ventricular internal  cavity size was moderately dilated. There is moderate asymmetric left ventricular hypertrophy of the basal-septal segment. Indeterminate diastolic filling due to E-A fusion.   Neuro/Psych negative neurological ROS  negative psych ROS   GI/Hepatic negative GI ROS, Neg liver ROS,   Endo/Other  negative endocrine ROS  Renal/GU negative Renal ROS  negative genitourinary   Musculoskeletal negative musculoskeletal ROS (+)   Abdominal   Peds negative pediatric ROS (+)  Hematology negative hematology ROS (+)   Anesthesia Other Findings   Reproductive/Obstetrics negative OB ROS                            Anesthesia Physical Anesthesia Plan  ASA: IV  Anesthesia Plan: MAC   Post-op Pain  Management:    Induction:   PONV Risk Score and Plan: 1  Airway Management Planned:   Additional Equipment:   Intra-op Plan:   Post-operative Plan:   Informed Consent: I have reviewed the patients History and Physical, chart, labs and discussed the procedure including the risks, benefits and alternatives for the proposed anesthesia with the patient or authorized representative who has indicated his/her understanding and acceptance.       Plan Discussed with: Anesthesiologist and CRNA  Anesthesia Plan Comments:         Anesthesia Quick Evaluation

## 2019-08-04 ENCOUNTER — Ambulatory Visit (HOSPITAL_COMMUNITY): Payer: Medicare Other

## 2019-08-04 ENCOUNTER — Other Ambulatory Visit: Payer: Self-pay

## 2019-08-04 ENCOUNTER — Encounter (HOSPITAL_COMMUNITY): Payer: Self-pay | Admitting: Gastroenterology

## 2019-08-04 ENCOUNTER — Ambulatory Visit (HOSPITAL_COMMUNITY)
Admission: RE | Admit: 2019-08-04 | Discharge: 2019-08-04 | Disposition: A | Discharge status: Home / Self Care | Payer: Medicare Other | Attending: Gastroenterology | Admitting: Gastroenterology

## 2019-08-04 ENCOUNTER — Encounter (HOSPITAL_COMMUNITY)
Admission: RE | Disposition: A | Discharge status: Home / Self Care | Payer: Self-pay | Source: Home / Self Care | Attending: Gastroenterology

## 2019-08-04 DIAGNOSIS — I2510 Atherosclerotic heart disease of native coronary artery without angina pectoris: Secondary | ICD-10-CM | POA: Diagnosis not present

## 2019-08-04 DIAGNOSIS — Z7901 Long term (current) use of anticoagulants: Secondary | ICD-10-CM | POA: Diagnosis not present

## 2019-08-04 DIAGNOSIS — I509 Heart failure, unspecified: Secondary | ICD-10-CM | POA: Insufficient documentation

## 2019-08-04 DIAGNOSIS — K922 Gastrointestinal hemorrhage, unspecified: Secondary | ICD-10-CM | POA: Diagnosis not present

## 2019-08-04 DIAGNOSIS — K648 Other hemorrhoids: Secondary | ICD-10-CM | POA: Diagnosis not present

## 2019-08-04 DIAGNOSIS — K625 Hemorrhage of anus and rectum: Secondary | ICD-10-CM | POA: Diagnosis not present

## 2019-08-04 DIAGNOSIS — I252 Old myocardial infarction: Secondary | ICD-10-CM | POA: Insufficient documentation

## 2019-08-04 HISTORY — PX: COLONOSCOPY WITH PROPOFOL: SHX5780

## 2019-08-04 HISTORY — PX: ESOPHAGOGASTRODUODENOSCOPY (EGD) WITH PROPOFOL: SHX5813

## 2019-08-04 SURGERY — ESOPHAGOGASTRODUODENOSCOPY (EGD) WITH PROPOFOL
Anesthesia: Monitor Anesthesia Care

## 2019-08-04 MED ORDER — LIDOCAINE 2% (20 MG/ML) 5 ML SYRINGE
INTRAMUSCULAR | Status: DC | PRN
  Administered 2019-08-04: 100 mg via INTRAVENOUS

## 2019-08-04 MED ORDER — PHENYLEPHRINE 40 MCG/ML (10ML) SYRINGE FOR IV PUSH (FOR BLOOD PRESSURE SUPPORT)
PREFILLED_SYRINGE | INTRAVENOUS | Status: DC | PRN
  Administered 2019-08-04 (×3): 80 ug via INTRAVENOUS

## 2019-08-04 MED ORDER — PROPOFOL 500 MG/50ML IV EMUL
INTRAVENOUS | Status: AC
  Filled 2019-08-04: qty 50

## 2019-08-04 MED ORDER — PROPOFOL 10 MG/ML IV BOLUS
INTRAVENOUS | Status: DC | PRN
  Administered 2019-08-04: 20 mg via INTRAVENOUS

## 2019-08-04 MED ORDER — LACTATED RINGERS IV SOLN: INTRAVENOUS | Status: DC

## 2019-08-04 MED ORDER — PROPOFOL 500 MG/50ML IV EMUL
INTRAVENOUS | Status: DC | PRN
  Administered 2019-08-04: 125 ug/kg/min via INTRAVENOUS

## 2019-08-04 SURGICAL SUPPLY — 25 items

## 2019-08-04 NOTE — Anesthesia Procedure Notes (Signed)
Date/Time: 08/04/2019 9:41 AM Performed by: Sharlette Dense, CRNA Oxygen Delivery Method: Simple face mask

## 2019-08-04 NOTE — Op Note (Signed)
[facility] Patient Name: Jason Day Procedure Date: 08/04/2019 MRN: [id number] Attending MD: Wonda Horner , MD Date of Birth: 11/02/1940 CSN: [id number] Age: 79 Admit Type: Outpatient Procedure:                Colonoscopy Indications:              Rectal bleeding Providers:                Wonda Horner, MD, Yehuda Mao,                            Technician, Danley Danker, CRNA Referring MD:              Medicines:                Propofol per Anesthesia Complications:            No immediate complications. Estimated Blood Loss:     Estimated blood loss: none. Procedure:                Pre-Anesthesia Assessment:                           - Prior to the procedure, a History and Physical                            was performed, and patient medications and                            allergies were reviewed. The patient's tolerance of                            previous anesthesia was also reviewed. The risks                            and benefits of the procedure and the sedation                            options and risks were discussed with the patient.                            All questions were answered, and informed consent                            was obtained. Prior Anticoagulants: The patient has                            taken no previous anticoagulant or antiplatelet                            agents. ASA Grade Assessment: II - A patient with                            mild systemic disease. After reviewing the risks  and benefits, the patient was deemed in                            satisfactory condition to undergo the procedure.                           - Prior to the procedure, a History and Physical                            was performed, and patient medications and                            allergies were reviewed. The patient's tolerance of                            previous anesthesia was also  reviewed. The risks                            and benefits of the procedure and the sedation                            options and risks were discussed with the patient.                            All questions were answered, and informed consent                            was obtained. Prior Anticoagulants: The patient has                            taken Xarelto (rivaroxaban), last dose was 3 days                            prior to procedure. ASA Grade Assessment: II - A                            patient with mild systemic disease. After reviewing                            the risks and benefits, the patient was deemed in                            satisfactory condition to undergo the procedure.                           After obtaining informed consent, the colonoscope                            was passed under direct vision. Throughout the                            procedure, the patient's blood pressure, pulse, and  oxygen saturations were monitored continuously. The                            CF-HQ190L ([id number]) Olympus colonoscope was                            introduced through the anus and advanced to the the                            cecum, identified by appendiceal orifice and                            ileocecal valve. The ileocecal valve, appendiceal                            orifice, and rectum were photographed. The                            colonoscopy was performed without difficulty. The                            patient tolerated the procedure well. The quality                            of the bowel preparation was good. Scope In: 9:58:18 AM Scope Out: 10:10:23 AM Scope Withdrawal Time: 0 hours 6 minutes 29 seconds  Total Procedure Duration: 0 hours 12 minutes 5 seconds  Findings:      Hemorrhoids were found on perianal exam.      Internal hemorrhoids were found during retroflexion and during perianal       exam. The hemorrhoids  were medium-sized.      The exam was otherwise normal throughout the examined colon. Impression:               - Hemorrhoids found on perianal exam.                           - Internal hemorrhoids.                           - No specimens collected. Moderate Sedation:      . Recommendation:           - Resume regular diet.                           - Continue present medications.                           - Either treat hemorrhoids medically or refer back                            to surgeon. Procedure Code(s):        --- Professional ---                           [phone], Colonoscopy, flexible; diagnostic, including  collection of specimen(s) by brushing or washing,                            when performed (separate procedure) Diagnosis Code(s):        --- Professional ---                           K64.8, Other hemorrhoids                           K62.5, Hemorrhage of anus and rectum CPT copyright 2019 American Medical Association. All rights reserved. The codes documented in this report are preliminary and upon coder review may  be revised to meet current compliance requirements. Wonda Horner, MD 08/04/2019 10:27:26 AM This report has been signed electronically. Number of Addenda: 0

## 2019-08-04 NOTE — H&P (Signed)
The patient is a 79 year old male who is here today for endoscopy and colonoscopy.  He has been experiencing rectal bleeding of uncertain etiology.  He had a normal flexible sigmoidoscopy about a year ago.  He saw a surgeon recently who did not feel he had significant hemorrhoids.  He has been on Xarelto but he has held this now for today's procedures.  Physical: No acute distress  Heart regular rhythm  Lungs clear  Abdomen soft and nontender  Impression: Rectal bleeding of uncertain etiology  Plan EGD and colonoscopy

## 2019-08-04 NOTE — Op Note (Signed)
[facility] Patient Name: Jason Day Procedure Date: 08/04/2019 MRN: [id number] Attending MD: Wonda Horner , MD Date of Birth: 1940-12-23 CSN: [id number] Age: 79 Admit Type: Outpatient Procedure:                Upper GI endoscopy Indications:              Hematochezia Providers:                Wonda Horner, MD, Yehuda Mao,                            Technician, Danley Danker, CRNA Referring MD:              Medicines:                Propofol per Anesthesia Complications:            No immediate complications. Estimated Blood Loss:     Estimated blood loss: none. Procedure:                Pre-Anesthesia Assessment:                           - Prior to the procedure, a History and Physical                            was performed, and patient medications and                            allergies were reviewed. The patient's tolerance of                            previous anesthesia was also reviewed. The risks                            and benefits of the procedure and the sedation                            options and risks were discussed with the patient.                            All questions were answered, and informed consent                            was obtained. Prior Anticoagulants: The patient                            stopped Xeralto 3-4 days ago. ASA Grade Assessment:                            II - A patient with mild systemic disease. After                            reviewing the risks and benefits, the patient was  deemed in satisfactory condition to undergo the                            procedure.                           After obtaining informed consent, the endoscope was                            passed under direct vision. Throughout the                            procedure, the patient's blood pressure, pulse, and                            oxygen saturations were monitored continuously. The                             [id number]-H190 ([id number]) Olympus gastroscope was                            introduced through the mouth, and advanced to the                            second part of duodenum. The upper GI endoscopy was                            accomplished without difficulty. The patient                            tolerated the procedure well. Scope In: Scope Out: Findings:      The examined esophagus was normal.      Diffuse moderately erythematous mucosa was found in the stomach.      Bilious fluid was found in the gastric body.      The examined duodenum was normal. Impression:               - Normal esophagus.                           - Erythematous mucosa in the stomach.                           - Bilious gastric fluid.                           - Normal examined duodenum.                           - No specimens collected. Moderate Sedation:      . Recommendation:           - Resume regular diet.                           - Continue present medications. Procedure Code(s):        --- Professional ---  16384, Esophagogastroduodenoscopy, flexible,                            transoral; diagnostic, including collection of                            specimen(s) by brushing or washing, when performed                            (separate procedure) Diagnosis Code(s):        --- Professional ---                           K31.89, Other diseases of stomach and duodenum                           K92.1, Melena (includes Hematochezia) CPT copyright 2019 American Medical Association. All rights reserved. The codes documented in this report are preliminary and upon coder review may  be revised to meet current compliance requirements. Wonda Horner, MD 08/04/2019 10:21:29 AM This report has been signed electronically. Number of Addenda: 0

## 2019-08-04 NOTE — Transfer of Care (Signed)
Immediate Anesthesia Transfer of Care Note  Patient: Jason Day  Procedure(s) Performed: ESOPHAGOGASTRODUODENOSCOPY (EGD) WITH PROPOFOL (N/A ) COLONOSCOPY WITH PROPOFOL (N/A )  Patient Location: Endoscopy Unit  Anesthesia Type:MAC  Level of Consciousness: drowsy  Airway & Oxygen Therapy: Patient Spontanous Breathing and Patient connected to face mask oxygen  Post-op Assessment: Report given to RN and Post -op Vital signs reviewed and stable  Post vital signs: Reviewed and stable  Last Vitals:  Vitals Value Taken Time  BP    Temp    Pulse 49 08/04/19 1019  Resp 16 08/04/19 1019  SpO2 100 % 08/04/19 1019  Vitals shown include unvalidated device data.  Last Pain:  Vitals:   08/04/19 0819  TempSrc: Oral  PainSc: 0-No pain         Complications: No complications documented.

## 2019-08-04 NOTE — Discharge Instructions (Signed)

## 2019-08-04 NOTE — Anesthesia Postprocedure Evaluation (Signed)
Anesthesia Post Note  Patient: Jason Day  Procedure(s) Performed: ESOPHAGOGASTRODUODENOSCOPY (EGD) WITH PROPOFOL (N/A ) COLONOSCOPY WITH PROPOFOL (N/A )     Patient location during evaluation: PACU Anesthesia Type: MAC Level of consciousness: awake and alert Pain management: pain level controlled Vital Signs Assessment: post-procedure vital signs reviewed and stable Respiratory status: spontaneous breathing, nonlabored ventilation, respiratory function stable and patient connected to nasal cannula oxygen Cardiovascular status: stable and blood pressure returned to baseline Postop Assessment: no apparent nausea or vomiting Anesthetic complications: no   No complications documented.  Last Vitals:  Vitals:   08/04/19 1020 08/04/19 1030  BP: (!) 108/48 120/64  Pulse: (!) 47 (!) 49  Resp: 16 16  Temp:    SpO2: 100% 98%    Last Pain:  Vitals:   08/04/19 0819  TempSrc: Oral  PainSc: 0-No pain                 Wanisha Shiroma

## 2019-08-04 NOTE — Telephone Encounter (Signed)
° °  Primary Cardiologist: Jenkins Rouge, MD  Chart reviewed as part of pre-operative protocol coverage. Given past medical history and time since last visit, based on ACC/AHA guidelines, Jason Day would be at acceptable risk for the planned procedure without further cardiovascular testing.   Patient with diagnosis of afib on Xarelto for anticoagulation.    Procedure: left carpal release Date of procedure: TBD  CHADS2-VASc score of 4 (age x2, CHF, CAD)  CrCl 45mL/min Platelet count 132K  Per office protocol, patient can hold Xarelto for 1-2 days prior to procedure.     I will route this recommendation to the requesting party via Epic fax function and remove from pre-op pool.  Please call with questions.  Jossie Ng. Kartier Bennison NP-C    08/04/2019, 9:40 AM [address] Office [phone] Fax [phone]

## 2019-08-04 NOTE — Telephone Encounter (Signed)
Patient with diagnosis of afib on Xarelto for anticoagulation.    Procedure: left carpal release Date of procedure: TBD  CHADS2-VASc score of 4 (age x2, CHF, CAD)  CrCl 52mL/min Platelet count 132K  Per office protocol, patient can hold Xarelto for 1-2 days prior to procedure.

## 2019-08-06 ENCOUNTER — Encounter (HOSPITAL_COMMUNITY): Payer: Self-pay | Admitting: Gastroenterology

## 2019-08-19 ENCOUNTER — Ambulatory Visit (INDEPENDENT_AMBULATORY_CARE_PROVIDER_SITE_OTHER): Payer: Medicare Other | Admitting: Internal Medicine

## 2019-08-19 ENCOUNTER — Other Ambulatory Visit: Payer: Self-pay

## 2019-08-19 ENCOUNTER — Encounter: Payer: Self-pay | Admitting: Internal Medicine

## 2019-08-19 VITALS — BP 106/54 | HR 60 | Ht 72.0 in | Wt 180.0 lb

## 2019-08-19 DIAGNOSIS — Z951 Presence of aortocoronary bypass graft: Secondary | ICD-10-CM

## 2019-08-19 DIAGNOSIS — I340 Nonrheumatic mitral (valve) insufficiency: Secondary | ICD-10-CM | POA: Diagnosis not present

## 2019-08-19 DIAGNOSIS — I519 Heart disease, unspecified: Secondary | ICD-10-CM | POA: Diagnosis not present

## 2019-08-19 DIAGNOSIS — I5042 Chronic combined systolic (congestive) and diastolic (congestive) heart failure: Secondary | ICD-10-CM

## 2019-08-19 DIAGNOSIS — I4819 Other persistent atrial fibrillation: Secondary | ICD-10-CM

## 2019-08-19 DIAGNOSIS — I255 Ischemic cardiomyopathy: Secondary | ICD-10-CM

## 2019-08-19 NOTE — Patient Instructions (Addendum)
Medication Instructions:  Your physician has recommended you make the following change in your medication:  1 stop aspirin 2 stop amiodarone   Alternatives to xarelto are; eliquis or pradaxa    *If you need a refill on your cardiac medications before your next appointment, please call your pharmacy*  Lab Work: None ordered.  If you have labs (blood work) drawn today and your tests are completely normal, you will receive your results only by: Marland Kitchen MyChart Message (if you have MyChart) OR . A paper copy in the mail If you have any lab test that is abnormal or we need to change your treatment, we will call you to review the results.  Testing/Procedures: None ordered.  Follow-Up: At [facility], you and your health needs are our priority.  As part of our continuing mission to provide you with exceptional heart care, we have created designated Provider Care Teams.  These Care Teams include your primary Cardiologist (physician) and Advanced Practice Providers (APPs -  Physician Assistants and Nurse Practitioners) who all work together to provide you with the care you need, when you need it.  We recommend signing up for the patient portal called "MyChart".  Sign up information is provided on this After Visit Summary.  MyChart is used to connect with patients for Virtual Visits (Telemedicine).  Patients are able to view lab/test results, encounter notes, upcoming appointments, etc.  Non-urgent messages can be sent to your provider as well.   To learn more about what you can do with MyChart, go to [email or web address].    Your next appointment:   Your physician wants you to follow-up in: As needed with Dr. Rayann Heman.     Other Instructions:

## 2019-08-19 NOTE — Progress Notes (Signed)
PCP: Patient, No Pcp Per Primary Cardiologist: Dr Johnsie Cancel Primary EP: Dr Rayann Heman  Jason Day is a 79 y.o. male who presents today for routine electrophysiology followup.  Since last being seen in our clinic, the patient reports doing very well. He is active.  Denies symptoms of afib.  He is mostly worried today about the costs of xarelto.  He discussed this for about 10 minutes during his visit today.  He has some bruising but no major bleeding.  Today, he denies symptoms of palpitations, chest pain, shortness of breath,  lower extremity edema, dizziness, presyncope, or syncope.  The patient is otherwise without complaint today.   Past Medical History:  Diagnosis Date  . Bleeding hemorrhoids   . Chronic combined systolic and diastolic heart failure ([place])   . Chronic sinus complaints    overuses afrin  . Coronary atherosclerosis of native coronary artery   . Hyperlipidemia   . Ischemic cardiomyopathy   . Left atrial enlargement   . Moderate mitral regurgitation   . Myocardial infarction ([place]) 09/2015  . Persistent atrial fibrillation ([place])   . RBBB    Past Surgical History:  Procedure Laterality Date  . BACK SURGERY  ~2014   disc repair  . CARDIOVERSION N/A 10/24/2018   Procedure: CARDIOVERSION;  Surgeon: Jerline Pain, MD;  Location: [facility] ENDOSCOPY;  Service: Cardiovascular;  Laterality: N/A;  . CHOLECYSTECTOMY    . COLONOSCOPY  04/06/2014   Hemorrhoids and diverticulosis performed in [place]  . COLONOSCOPY WITH PROPOFOL N/A 08/04/2019   Procedure: COLONOSCOPY WITH PROPOFOL;  Surgeon: Wonda Horner, MD;  Location: [facility] ENDOSCOPY;  Service: Endoscopy;  Laterality: N/A;  . 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  ESOPHAGOGASTRODUODENOSCOPY (EGD) WITH PROPOFOL N/A 08/04/2019   Procedure: ESOPHAGOGASTRODUODENOSCOPY (EGD) WITH PROPOFOL;  Surgeon: Wonda Horner, MD;  Location: [facility] ENDOSCOPY;  Service: Endoscopy;  Laterality: N/A;  . FOOT SURGERY    . HEMORRHOID BANDING    . HEMORRHOID SURGERY    . REVERSE SHOULDER ARTHROPLASTY Left 08/21/2018   Procedure: REVERSE SHOULDER ARTHROPLASTY;  Surgeon: Justice Britain, MD;  Location: [facility] ORS;  Service: Orthopedics;  Laterality: Left;  . RIGHT/LEFT HEART CATH AND CORONARY ANGIOGRAPHY N/A 10/16/2016   Procedure: RIGHT/LEFT HEART CATH AND CORONARY ANGIOGRAPHY;  Surgeon: Belva Crome, MD;  Location: [place] CV LAB;  Service: Cardiovascular;  Laterality: N/A;  . stents in liver    . TEE WITHOUT CARDIOVERSION N/A 10/18/2016   Procedure: TRANSESOPHAGEAL ECHOCARDIOGRAM (TEE);  Surgeon: Gaye Pollack, MD;  Location: [facility];  Service: Open Heart Surgery;  Laterality: N/A;    ROS- all systems are reviewed and negatives except as per HPI above  Current Outpatient Medications  Medication Sig Dispense Refill  . amiodarone (PACERONE) 200 MG tablet Take 1 tablet (200 mg total) by mouth daily. 90 tablet 3  . aspirin EC 81 MG tablet Take 81 mg by mouth daily.    Marland Kitchen atorvastatin (LIPITOR) 20 MG tablet Take 0.5 tablets (10 mg total) by mouth daily. 45 tablet 3  . digoxin (LANOXIN) 0.125 MG tablet Take 0.5 tablets (0.0625 mg total) by mouth daily. 45 tablet 3  . metoprolol succinate (TOPROL-XL) 25 MG 24 hr tablet TAKE 1/2 TABLET BY MOUTH EVERY DAY 45  tablet 3  . rivaroxaban (XARELTO) 20 MG TABS tablet Take 1 tablet (20 mg total) by mouth daily with supper. 30 tablet 5  . sacubitril-valsartan (ENTRESTO) 49-51 MG Take 1 tablet by mouth 2 (two) times daily. 60 tablet 11  . spironolactone (ALDACTONE) 25 MG tablet Take 1 tablet (25 mg total) by mouth daily. 90 tablet 3   No current facility-administered medications for this visit.    Physical Exam: Vitals:   08/19/19 1000  BP: (!)  106/54  Pulse: 60  SpO2: 99%  Weight: 180 lb (81.6 kg)  Height: 6' (1.829 m)    GEN- The patient is well appearing, alert and oriented x 3 today.   Head- normocephalic, atraumatic Eyes-  Sclera clear, conjunctiva pink Ears- hearing intact Oropharynx- clear Lungs-   normal work of breathing Heart- irregular rate and rhythm  GI- soft  Extremities- no clubbing, cyanosis, or edema  Wt Readings from Last 3 Encounters:  08/19/19 180 lb (81.6 kg)  08/04/19 175 lb 0.7 oz (79.4 kg)  06/05/19 180 lb (81.6 kg)    EKG tracing ordered today is personally reviewed and shows afib, RBBB  Echo reviewed, EF 30%, moderaet MR, severe biatrial enlargement  Assessment and Plan:  1. Ischemic CM/ chronic systolic dysfunction Doing well with NYHA Class I symptoms Stop ASA as he is also on [place]. Given his advanced age and life preferences, I would not advise an ICD. We discussed using a share decision making process today risks and benefits of an ICD and he and his wife are clear in their decision to avoid this procedure currently.  2. Permanent afib Rate controlled Stop amiodarone Increase toprol when needed He is very concerned about costs of [place] therapy. We discussed at length today.  His chads2vasc score is at least 4.  I have offered coumadin, ongoing DOAC use, or watchman as options today.  He would prefer to continue xarelto at this time. Stop ASA to reduce bruising/ bleeding risks as per guidelines If his MR progresses, could consider MAZE, LAA removal and valve repair.  Given his advanced age and prior sternotomy, this would not be a trivial undertaking.   Risks, benefits and potential toxicities for medications prescribed and/or refilled reviewed with patient today.   Return as needed  Thompson Grayer MD, [facility] 08/19/2019 10:31 AM

## 2019-08-20 ENCOUNTER — Telehealth: Payer: Self-pay

## 2019-08-20 NOTE — Telephone Encounter (Addendum)
**Note De-Identified Rocko Fesperman Obfuscation** I received the following staff message:  Jason Jewel, RN  Jason Day, Jason Boston, LPN The patient will need some assistance with meds. You have helped before with Xarelto.    I advised you would give him a call to start the process again for his Xarelto.   I also gave him 3 weeks worth of samples at this visit.   Thanks Jason Day!   Jason Day / Jason Day (Dr. Jackalyn Lombard new nurse  I called the pts cell phone X 3 and received message that the number I dialed is no longer in service. I then called his home phone got no answer and could not leave a message as the pts VM has not been set up.  Per the pts chart this was taken care of by one of our pharmacist, Jason Day on 01/12/2019 and the pt was switched to [facility] per his request at that time. Please see that phone note for details.  It appears he did not make the change and we have given him Xarelto samples from time to time since then to help him out. No mention in chart of him ever applying for assistance through [facility] pt asst program.  I will continue to call the pt to discuss and to recommend that he apply for asst for his Xarelto through the [place] and [employer] Pt Asst program.

## 2019-08-21 ENCOUNTER — Telehealth: Payer: Self-pay | Admitting: Internal Medicine

## 2019-08-21 NOTE — Telephone Encounter (Signed)
**Note De-Identified Jason Day Obfuscation** The pt called the office today with a call back number of [phone] which is not listed in his chart. I called the number and did reach the pt.  He states that his Xarelto has gone up in price to $130/30 day supply because he is in the donut hole. He states that he has not met his 4% out of pocket requirement to be approved for [employer] and [employer] Pt Asst Program and that he cannot afford it.  He states that he wants to go back on [place] as it is much cheaper for him. I advised him that one of our pharmacist will be calling him ti discuss him switching from Xarelto to [place].

## 2019-08-21 NOTE — Telephone Encounter (Signed)
Spoke with patient. States he has a few weeks left of Xarelto. He cannot afford Xarelto while in the donut hole and does not feel comfortable doing the program [facility] has for $85/month. Wants to switch to warfarin but wants Dr. Rayann Heman ok. States he felt like Dr. Rayann Heman did not want him to switch to warfarin but he has no other options. I asked him to call us when he had 1 week of Xarelto left. He asked me to call him after I speak with Dr. Rayann Heman.

## 2019-08-21 NOTE — Telephone Encounter (Signed)
New message   Patient would like a referral for a Gastroenterologist. Please call to discuss.

## 2019-08-22 NOTE — Telephone Encounter (Signed)
Ok to go back to coumadin if he has to f/u coumadin clinic

## 2019-08-23 NOTE — Telephone Encounter (Signed)
He should contact pcp or primary cardiologist for this referral.

## 2019-08-25 NOTE — Telephone Encounter (Signed)
Called pt at [phone] and LVM to return call Advising pt Dr. Rayann Heman and Dr. Johnsie Cancel said ok to switch to coumadin. Will resume pt old warfarin dose of 5mg  daily when he has 4 days of Xarelto left.

## 2019-08-27 MED ORDER — WARFARIN SODIUM 5 MG PO TABS
5.0000 mg | ORAL_TABLET | Freq: Every day | ORAL | 0 refills | Status: DC
Start: 2019-08-27 — End: 2019-11-24

## 2019-08-27 NOTE — Telephone Encounter (Signed)
Called pt and advised to contact pcp or primary cardiologist for GI referral.

## 2019-08-27 NOTE — Telephone Encounter (Signed)
Patient called back. Advised it was ok to go back to warfarin. States he will be out of Xarelto on 7/26. Therefore I will have him start warfarin on 7/24. Warfarin 5mg  daily. rx sent to pharmacy. Coumadin clinic apt scheduled for 7/29.

## 2019-08-27 NOTE — Addendum Note (Signed)
Addended by: Marcelle Overlie D on: 08/27/2019 12:31 PM   Modules accepted: Orders

## 2019-09-10 ENCOUNTER — Telehealth: Payer: Self-pay | Admitting: *Deleted

## 2019-09-10 NOTE — Telephone Encounter (Signed)
Pt left a message on our voicemail that he needed to know how and when to resume warfarin and when his appt would be. Called the pt back but there was no answer so had to leave a message for the pt to call back regarding dosing instructions and next appt.

## 2019-09-10 NOTE — Telephone Encounter (Signed)
Patient called back. States he has 2 days of xarelto left. Will have him start warfarin tonight. Elizebeth Koller out what he has left of Xarelto as an overlap. I have moved him coumadin appointment up to Tuesday since he will start warfarin sooner than originally planned.

## 2019-09-10 NOTE — Telephone Encounter (Signed)
Jason Day is returning [name]'s call. I advised the pt of his upcoming appt, but he still needs information on how he is to take the medication. Please leave a detailed message if he does not answer so he does not have to callback.

## 2019-09-11 DIAGNOSIS — K588 Other irritable bowel syndrome: Secondary | ICD-10-CM | POA: Diagnosis not present

## 2019-09-15 ENCOUNTER — Ambulatory Visit (INDEPENDENT_AMBULATORY_CARE_PROVIDER_SITE_OTHER): Payer: Medicare Other

## 2019-09-15 ENCOUNTER — Other Ambulatory Visit: Payer: Self-pay

## 2019-09-15 DIAGNOSIS — Z951 Presence of aortocoronary bypass graft: Secondary | ICD-10-CM

## 2019-09-15 DIAGNOSIS — Z5181 Encounter for therapeutic drug level monitoring: Secondary | ICD-10-CM | POA: Diagnosis not present

## 2019-09-15 LAB — POCT INR: INR: 2.2 (ref 2.0–3.0)

## 2019-09-15 NOTE — Patient Instructions (Signed)
Description   Continue on same dosage 5mg  daily.  Recheck in 1 week.

## 2019-09-16 ENCOUNTER — Encounter (HOSPITAL_COMMUNITY): Payer: Self-pay

## 2019-09-16 ENCOUNTER — Encounter (HOSPITAL_COMMUNITY): Payer: Self-pay | Admitting: *Deleted

## 2019-09-16 ENCOUNTER — Emergency Department (HOSPITAL_COMMUNITY)
Admission: EM | Admit: 2019-09-16 | Discharge: 2019-09-16 | Disposition: A | Discharge status: Home / Self Care | Payer: Medicare Other | Attending: Emergency Medicine | Admitting: Emergency Medicine

## 2019-09-16 ENCOUNTER — Emergency Department (HOSPITAL_COMMUNITY)
Admission: EM | Admit: 2019-09-16 | Discharge: 2019-09-16 | Disposition: A | Discharge status: Home / Self Care | Payer: Medicare Other | Source: Home / Self Care | Attending: Emergency Medicine | Admitting: Emergency Medicine

## 2019-09-16 ENCOUNTER — Telehealth: Payer: Self-pay | Admitting: Cardiovascular Disease

## 2019-09-16 ENCOUNTER — Other Ambulatory Visit: Payer: Self-pay

## 2019-09-16 DIAGNOSIS — R140 Abdominal distension (gaseous): Secondary | ICD-10-CM | POA: Insufficient documentation

## 2019-09-16 DIAGNOSIS — Z5321 Procedure and treatment not carried out due to patient leaving prior to being seen by health care provider: Secondary | ICD-10-CM | POA: Diagnosis not present

## 2019-09-16 DIAGNOSIS — R1030 Lower abdominal pain, unspecified: Secondary | ICD-10-CM | POA: Insufficient documentation

## 2019-09-16 DIAGNOSIS — R339 Retention of urine, unspecified: Secondary | ICD-10-CM

## 2019-09-16 DIAGNOSIS — R197 Diarrhea, unspecified: Secondary | ICD-10-CM | POA: Diagnosis not present

## 2019-09-16 DIAGNOSIS — Z87891 Personal history of nicotine dependence: Secondary | ICD-10-CM | POA: Insufficient documentation

## 2019-09-16 DIAGNOSIS — Z7901 Long term (current) use of anticoagulants: Secondary | ICD-10-CM | POA: Insufficient documentation

## 2019-09-16 DIAGNOSIS — Z951 Presence of aortocoronary bypass graft: Secondary | ICD-10-CM | POA: Insufficient documentation

## 2019-09-16 DIAGNOSIS — I2510 Atherosclerotic heart disease of native coronary artery without angina pectoris: Secondary | ICD-10-CM | POA: Insufficient documentation

## 2019-09-16 LAB — URINALYSIS, ROUTINE W REFLEX MICROSCOPIC
Bilirubin Urine: NEGATIVE
Glucose, UA: NEGATIVE mg/dL
Ketones, ur: NEGATIVE mg/dL
Leukocytes,Ua: NEGATIVE
Nitrite: NEGATIVE
Protein, ur: NEGATIVE mg/dL
Specific Gravity, Urine: 1.012 (ref 1.005–1.030)
pH: 5 (ref 5.0–8.0)

## 2019-09-16 LAB — CBC
HCT: 42.4 % (ref 39.0–52.0)
Hemoglobin: 13.6 g/dL (ref 13.0–17.0)
MCH: 31.3 pg (ref 26.0–34.0)
MCHC: 32.1 g/dL (ref 30.0–36.0)
MCV: 97.5 fL (ref 80.0–100.0)
Platelets: 99 10*3/uL — ABNORMAL LOW (ref 150–400)
RBC: 4.35 MIL/uL (ref 4.22–5.81)
RDW: 14.6 % (ref 11.5–15.5)
WBC: 7.7 10*3/uL (ref 4.0–10.5)
nRBC: 0 % (ref 0.0–0.2)

## 2019-09-16 LAB — BASIC METABOLIC PANEL
Anion gap: 11 (ref 5–15)
BUN: 20 mg/dL (ref 8–23)
CO2: 28 mmol/L (ref 22–32)
Calcium: 8.9 mg/dL (ref 8.9–10.3)
Chloride: 105 mmol/L (ref 98–111)
Creatinine, Ser: 1.42 mg/dL — ABNORMAL HIGH (ref 0.61–1.24)
GFR calc Af Amer: 54 mL/min — ABNORMAL LOW (ref >60)
GFR calc non Af Amer: 47 mL/min — ABNORMAL LOW (ref >60)
Glucose, Bld: 102 mg/dL — ABNORMAL HIGH (ref 70–99)
Potassium: 4.2 mmol/L (ref 3.5–5.1)
Sodium: 144 mmol/L (ref 135–145)

## 2019-09-16 NOTE — Telephone Encounter (Signed)
New Message  Patient states that he is returning a call to Dr. Kyla Balzarine nurse. Please give patient a call back.

## 2019-09-16 NOTE — ED Notes (Signed)
Pt stated that he was leaving and his wife is coming to pick him up. I called pt wife and she stated she is coming to take him to another hospital

## 2019-09-16 NOTE — ED Triage Notes (Signed)
Patient states he has not voided in 4 days.   C/o lower abdominal pain 8/10   A/ox4 Ambulatory in triage.

## 2019-09-16 NOTE — ED Provider Notes (Signed)
[facility] DEPT Provider Note   CSN: [id number] Arrival date & time: 09/16/19  1435     History Chief Complaint  Patient presents with  . Urinary Retention    Jason Day is a 79 y.o. male.  79 year old male presents with complaint of urinary retention.  Patient states he is been unable to pass any urine since Sunday (7/25).  Patient states this is the first time this is ever happened to him.  Patient has had elevated PSAs in the past, treated with medication.  Denies nausea, vomiting, changes in bowel habits, dysuria, hematuria.  Foley placed prior to exam and patient is feeling much better at this time.        Past Medical History:  Diagnosis Date  . Bleeding hemorrhoids   . Chronic combined systolic and diastolic heart failure ([place])   . Chronic sinus complaints    overuses afrin  . Coronary atherosclerosis of native coronary artery   . Hyperlipidemia   . Ischemic cardiomyopathy   . Left atrial enlargement   . Moderate mitral regurgitation   . Myocardial infarction ([name]) 09/2015  . Persistent atrial fibrillation ([place])   . RBBB     Patient Active Problem List   Diagnosis Date Noted  . Mitral regurgitation 12/25/2018  . S/P reverse total shoulder arthroplasty, 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  . Acute infectious diarrhea 09/19/2017  . Chest pain 08/28/2017  . Change in behavior 08/28/2017  . Irritable bowel syndrome with both constipation and diarrhea 06/07/2017  . 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 Elevated PSA 04/23/2017  . Elevated bilirubin 04/22/2017  . Wellness examination 04/22/2017  . Internal and external bleeding hemorrhoids 04/22/2017  . Hematochezia 04/22/2017  . Alternating constipation and diarrhea 04/22/2017  . Atrial fibrillation ([place])   . Chronic combined systolic and diastolic heart failure ([place])   . Chronic sinus complaints   . Hyperlipidemia   . Acute combined systolic and diastolic congestive heart failure ([place]) 10/16/2016  .  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 BACK SURGERY  ~2014   disc repair  . CARDIOVERSION N/A 10/24/2018   Procedure: CARDIOVERSION;  Surgeon: Jerline Pain, MD;  Location: [facility] ENDOSCOPY;  Service: Cardiovascular;  Laterality: N/A;  . CHOLECYSTECTOMY    . COLONOSCOPY  04/06/2014   Hemorrhoids and diverticulosis performed in [place]  . COLONOSCOPY WITH PROPOFOL N/A 08/04/2019   Procedure: COLONOSCOPY WITH PROPOFOL;  Surgeon: Wonda Horner, MD;  Location: [facility] ENDOSCOPY;  Service: Endoscopy;  Laterality: N/A;  . CORONARY ARTERY BYPASS GRAFT N/A 10/18/2016   Procedure: CORONARY ARTERY BYPASS GRAFTING (CABG) x five , using left internal mammary artery and right leg greater saphenous vein harvested endoscopically;  Surgeon: Gaye Pollack, MD;  Location: [facility] OR;  Service: Open Heart Surgery;  Laterality: N/A;  . ESOPHAGOGASTRODUODENOSCOPY (EGD) WITH PROPOFOL N/A 08/04/2019   Procedure: ESOPHAGOGASTRODUODENOSCOPY (EGD) WITH PROPOFOL;  Surgeon: Wonda Horner, MD;  Location: [facility] ENDOSCOPY;  Service: Endoscopy;  Laterality: N/A;  . FOOT SURGERY    . HEMORRHOID BANDING    . HEMORRHOID SURGERY    . REVERSE SHOULDER ARTHROPLASTY Left 08/21/2018   Procedure: REVERSE SHOULDER ARTHROPLASTY;  Surgeon: Justice Britain, MD;  Location: [facility] ORS;  Service: Orthopedics;  Laterality: Left;  . RIGHT/LEFT HEART CATH AND CORONARY ANGIOGRAPHY N/A 10/16/2016   Procedure: RIGHT/LEFT HEART CATH AND CORONARY ANGIOGRAPHY;  Surgeon: Belva Crome, MD;  Location: [facility]  INVASIVE CV LAB;  Service: Cardiovascular;  Laterality: N/A;  . stents in liver    . TEE WITHOUT CARDIOVERSION N/A 10/18/2016   Procedure: TRANSESOPHAGEAL ECHOCARDIOGRAM (TEE);  Surgeon: Gaye Pollack, MD;  Location: [facility];  Service: Open Heart Surgery;  Laterality: N/A;       Family History  Problem Relation Age of Onset  . COPD Father   . Emphysema  Father   . Healthy Brother   . Diabetes Neg Hx   . Cancer Neg Hx   . Stomach cancer Neg Hx   . Colon cancer Neg Hx     Social History   Tobacco Use  . Smoking status: Former Smoker    Packs/day: 0.50    Types: Cigarettes    Quit date: 1970    Years since quitting: 51.6  . Smokeless tobacco: Never Used  . Tobacco comment: Smoked on and off  Vaping Use  . Vaping Use: Never used  Substance Use Topics  . Alcohol use: No  . Drug use: No    Home Medications Prior to Admission medications   Medication Sig Start Date End Date Taking? Authorizing Provider  atorvastatin (LIPITOR) 20 MG tablet Take 0.5 tablets (10 mg total) by mouth daily. 07/09/19   Josue Hector, MD  digoxin (LANOXIN) 0.125 MG tablet Take 0.5 tablets (0.0625 mg total) by mouth daily. 07/13/19   Josue Hector, MD  metoprolol succinate (TOPROL-XL) 25 MG 24 hr tablet TAKE 1/2 TABLET BY MOUTH EVERY DAY 07/09/19   Josue Hector, MD  sacubitril-valsartan (ENTRESTO) 49-51 MG Take 1 tablet by mouth 2 (two) times daily. 07/29/18   Josue Hector, MD  spironolactone (ALDACTONE) 25 MG tablet Take 1 tablet (25 mg total) by mouth daily. 09/09/18   Josue Hector, MD  warfarin (COUMADIN) 5 MG tablet Take 1 tablet (5 mg total) by mouth daily. Start on 09/12/19 08/27/19   Josue Hector, MD    Allergies    Patient has no known allergies.  Review of Systems   Review of Systems  Constitutional: Negative for fever.  Gastrointestinal: Positive for abdominal distention. Negative for nausea and vomiting.  Genitourinary: Positive for difficulty urinating. Negative for dysuria and hematuria.  Skin: Negative for rash and wound.  All other systems reviewed and are negative.   Physical Exam Updated Vital Signs BP (!) 159/91   Pulse 98   Resp 18   SpO2 98%   Physical Exam Vitals and nursing note reviewed.  Constitutional:      General: He is not in acute distress.    Appearance: He is well-developed. He is not diaphoretic.    HENT:     Head: Normocephalic and atraumatic.  Pulmonary:     Effort: Pulmonary effort is normal.  Abdominal:     General: There is no distension.     Palpations: Abdomen is soft.     Tenderness: There is no abdominal tenderness.  Musculoskeletal:     Right lower leg: No edema.     Left lower leg: No edema.  Skin:    General: Skin is warm and dry.     Findings: No erythema or rash.  Neurological:     Mental Status: He is alert and oriented to person, place, and time.  Psychiatric:        Behavior: Behavior normal.     ED Results / Procedures / Treatments   Labs (all labs ordered are listed, but only abnormal results are displayed) Labs Reviewed  URINALYSIS, ROUTINE W REFLEX MICROSCOPIC - Abnormal; Notable for the following components:      Result Value   Hgb urine dipstick MODERATE (*)    Bacteria, UA RARE (*)    All other components within normal limits  BASIC METABOLIC PANEL - Abnormal; Notable for the following components:   Glucose, Bld 102 (*)    Creatinine, Ser 1.42 (*)    GFR calc non Af Amer 47 (*)    GFR calc Af Amer 54 (*)    All other components within normal limits  CBC - Abnormal; Notable for the following components:   Platelets 99 (*)    All other components within normal limits    EKG None  Radiology No results found.  Procedures Procedures (including critical care time)  Medications Ordered in ED Medications - No data to display  ED Course  I have reviewed the triage vital signs and the nursing notes.  Pertinent labs & imaging results that were available during my care of the patient were reviewed by me and considered in my medical decision making (see chart for details).  Clinical Course as of Sep 15 1632  Wed Sep 16, 6355  [id number] 79 year old male presents to the ER with acute urinary retention.  Patient was greater than 999 mL on bladder scan.  Foley catheter was placed without difficulty and patient is feeling much better at this time.   BMP with slight increase in creatinine, CBC with platelets of 99 otherwise hemoglobin is 13.9.  Urinalysis with moderate hemoglobin, does not suggest UTI.  Patient would like to be discharged with his Foley in place to follow-up with [facility] whom he has already been in touch with.  Return to ER for decreased flow/catheter obstruction or other concerns.   [LM]    Clinical Course User Index [LM] Roque Lias   MDM Rules/Calculators/A&P                          Final Clinical Impression(s) / ED Diagnoses Final diagnoses:  Urinary retention    Rx / DC Orders ED Discharge Orders    None       Roque Lias 09/16/19 1634    Quintella Reichert, MD 09/16/19 Einar Crow

## 2019-09-16 NOTE — ED Notes (Addendum)
Bladder scan- Greater than 913mls

## 2019-09-16 NOTE — ED Triage Notes (Signed)
Pt is here with urinary retention times 2 days and believes it is from taking glycopyrrolate and fiber for concern for diarrhea. Pt states that he is leaking urine, but no stream. Decreased eating for last 2 days.  Alert and oriented.  Pt is alert and complains of suprapubic discomfort

## 2019-09-16 NOTE — Telephone Encounter (Signed)
Called patient back. Patient stated he is in the ED waiting to get an ultrasound since he has not urinated for several days. Patient is wanting to leave, or at least have his wife with him. Encouraged patient to stay, that it might take some time but they need to find out what is going on with him. Patient verbalized understanding.

## 2019-09-22 DIAGNOSIS — N401 Enlarged prostate with lower urinary tract symptoms: Secondary | ICD-10-CM | POA: Diagnosis not present

## 2019-09-22 DIAGNOSIS — R330 Drug induced retention of urine: Secondary | ICD-10-CM | POA: Diagnosis not present

## 2019-09-23 ENCOUNTER — Other Ambulatory Visit: Payer: Self-pay

## 2019-09-23 ENCOUNTER — Ambulatory Visit (INDEPENDENT_AMBULATORY_CARE_PROVIDER_SITE_OTHER): Payer: Medicare Other

## 2019-09-23 DIAGNOSIS — Z951 Presence of aortocoronary bypass graft: Secondary | ICD-10-CM | POA: Diagnosis not present

## 2019-09-23 DIAGNOSIS — Z5181 Encounter for therapeutic drug level monitoring: Secondary | ICD-10-CM | POA: Diagnosis not present

## 2019-09-23 LAB — PROTIME-INR
INR: 8.2 (ref 0.9–1.2)
Prothrombin Time: 80.5 s — ABNORMAL HIGH (ref 9.1–12.0)

## 2019-09-23 LAB — POCT INR: INR: 8 — AB (ref 2.0–3.0)

## 2019-09-23 NOTE — Patient Instructions (Signed)
Description   INR >8.0, sent pt to lab for STAT INR.  Go to ED with bleeding problems. INR from [facility] 8.2, called LMOM with detailed instructions to hold Warfarin until recheck on Monday.  Also asked pt to call us back to confirm receipt of message and schedule follow-up appt on Monday 09/28/19. Patient called back- confirmed message to hold until Monday and scheduled appointment for Monday. Coumadin Clinic [phone].

## 2019-09-24 DIAGNOSIS — R330 Drug induced retention of urine: Secondary | ICD-10-CM | POA: Diagnosis not present

## 2019-09-24 DIAGNOSIS — N401 Enlarged prostate with lower urinary tract symptoms: Secondary | ICD-10-CM | POA: Diagnosis not present

## 2019-09-29 ENCOUNTER — Telehealth: Payer: Self-pay

## 2019-09-29 NOTE — Telephone Encounter (Signed)
Attempted to contact pt, pt was seen in office on Wednesday 09/23/19 INR was >8.0, pt was sent for STAT lab work INR 8.2.  Called pt, advised to hold Warfarin until INR recheck on Monday 09/28/19.  Pt did not come to scheduled appt on 09/28/19.  Attempted to call and reschedule LMOM TCB, need to reschedule appt ASAP.

## 2019-09-29 NOTE — Telephone Encounter (Signed)
Pt returned call to clinic, [name] Hemphill RN made appt for pt in clinic tomorrow 09/30/19 at 12:45pm.  Pt verbalized understanding of appt date and time.

## 2019-09-30 ENCOUNTER — Other Ambulatory Visit: Payer: Self-pay

## 2019-09-30 ENCOUNTER — Ambulatory Visit (INDEPENDENT_AMBULATORY_CARE_PROVIDER_SITE_OTHER): Payer: Medicare Other | Admitting: *Deleted

## 2019-09-30 DIAGNOSIS — Z951 Presence of aortocoronary bypass graft: Secondary | ICD-10-CM

## 2019-09-30 DIAGNOSIS — Z5181 Encounter for therapeutic drug level monitoring: Secondary | ICD-10-CM

## 2019-09-30 DIAGNOSIS — N401 Enlarged prostate with lower urinary tract symptoms: Secondary | ICD-10-CM | POA: Diagnosis not present

## 2019-09-30 DIAGNOSIS — R330 Drug induced retention of urine: Secondary | ICD-10-CM | POA: Diagnosis not present

## 2019-09-30 LAB — POCT INR: INR: 1.4 — AB (ref 2.0–3.0)

## 2019-09-30 NOTE — Patient Instructions (Signed)
Description   Start taking 1 tablet daily except for 1/2 a tablet on Tuesday, Thursday and Saturday. Recheck INR in 1 week. Call coumadin clinic for any changes in medications or upcoming procedures or any questions. [phone].

## 2019-10-08 ENCOUNTER — Other Ambulatory Visit: Payer: Self-pay | Admitting: Cardiovascular Disease

## 2019-10-09 ENCOUNTER — Other Ambulatory Visit: Payer: Self-pay

## 2019-10-09 ENCOUNTER — Ambulatory Visit (INDEPENDENT_AMBULATORY_CARE_PROVIDER_SITE_OTHER): Payer: Medicare Other | Admitting: *Deleted

## 2019-10-09 DIAGNOSIS — Z951 Presence of aortocoronary bypass graft: Secondary | ICD-10-CM | POA: Diagnosis not present

## 2019-10-09 DIAGNOSIS — Z5181 Encounter for therapeutic drug level monitoring: Secondary | ICD-10-CM

## 2019-10-09 LAB — POCT INR: INR: 1.8 — AB (ref 2.0–3.0)

## 2019-10-09 NOTE — Patient Instructions (Addendum)
Description   Start taking 1 tablet daily except for 1/2 a tablet on Sundays and Thursdays. Recheck INR in 4 weeks- per pt request.  Call coumadin clinic for any changes in medications or upcoming procedures. [phone]

## 2019-10-09 NOTE — Progress Notes (Signed)
Recommended pt come back in 1 week to have INR checked. Pt refused and stated he would come back on September 23. Informed pt that we do not advise he wait that long to have INR checked. Informed pt waiting that long he is at a higher risk for bleeding, stroke and blood clots. Pt verbalized understanding and stated that the earliest he would come in to have his INR checked is 11/05/2019. Appointment scheduled for that day.

## 2019-10-12 DIAGNOSIS — R330 Drug induced retention of urine: Secondary | ICD-10-CM | POA: Diagnosis not present

## 2019-10-12 DIAGNOSIS — N401 Enlarged prostate with lower urinary tract symptoms: Secondary | ICD-10-CM | POA: Diagnosis not present

## 2019-10-13 ENCOUNTER — Telehealth: Payer: Self-pay | Admitting: Cardiovascular Disease

## 2019-10-13 NOTE — Telephone Encounter (Signed)
Patient is calling to speak with Jeani Hawking to renew his [name] application - it needs to be done before 02/19/20. Please call.

## 2019-10-13 NOTE — Telephone Encounter (Signed)
**Note De-Identified [name] Obfuscation** The pt is advised to print a [employer] pt asst application offline (I also gave him [employer]'s pt asst phone number incase he has questions for them or to request they mail him an application), complete his part of the application, obtain any required documents per [employer], and to bring all to Dr Mariana Arn office to drop off and that we will take care of the provider part od application and will fax all to [employer] pt Jason Day.  He thanked me for calling him back to discuss.

## 2019-10-15 DIAGNOSIS — R330 Drug induced retention of urine: Secondary | ICD-10-CM | POA: Diagnosis not present

## 2019-10-15 DIAGNOSIS — N401 Enlarged prostate with lower urinary tract symptoms: Secondary | ICD-10-CM | POA: Diagnosis not present

## 2019-10-19 ENCOUNTER — Telehealth: Payer: Self-pay | Admitting: Cardiovascular Disease

## 2019-10-19 NOTE — Telephone Encounter (Signed)
   Primary Cardiologist:Peter Johnsie Cancel, MD  Chart reviewed as part of pre-operative protocol coverage. Because of Jason Day past medical history and time since last visit, they will require a follow-up visit in order to better assess preoperative cardiovascular risk.  Jason Day was last seen on 07/2019 by Dr. 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 At last OV in 05/2019, recommended f/u in 6 months which is scheduled for October. Given patient's complex medical history, advanced age and moderate risk of CV complications would recommend in-office appointment for pre-op risk stratification and optimization prior to undergoing general anesthesia.  Pre-op covering staff: - Please schedule appointment and call patient to inform them. If patient already had an upcoming appointment within acceptable timeframe, please add "pre-op clearance" to the appointment notes so provider is aware - can we maybe move up the visit with Dr. Johnsie Cancel? - Please contact requesting surgeon's office via preferred method (i.e, phone, fax) to inform them of need for appointment prior to surgery.  If applicable, this message will also be routed to pharmacy pool for input on holding anticoagulant/antiplatelet agent as requested below so that this information is available to the clearing provider at time of patient's appointment.   [name], PA-C  10/19/2019, 4:13 PM

## 2019-10-19 NOTE — Telephone Encounter (Signed)
Left message for the pt to call back for a pre op appt.

## 2019-10-19 NOTE — Telephone Encounter (Signed)
   [facility] Pre-operative Risk Assessment    Request for surgical clearance:  1. What type of surgery is being performed? Cystoscopy with Transurethral Resection of the Prostate    2. When is this surgery scheduled? TBD   3. What type of clearance is required (medical clearance vs. Pharmacy clearance to hold med vs. Both)? Both  4. Are there any medications that need to be held prior to surgery and how long? Warfarin, to be held 5 days prior   5. Practice name and name of physician performing surgery? [facility]  Dr. [name]   6. What is your office phone number? [phone] (ext: [phone])    7.   What is your office fax number? [phone]  8.   Anesthesia type (None, local, MAC, general) ? general   Jason Day 10/19/2019, 3:04 PM  _________________________________________________________________   (provider comments below)

## 2019-10-20 NOTE — Telephone Encounter (Signed)
Pt has been scheduled to see Dr. Johnsie Cancel 10/30/19 for pre op clearance. I will forward clearance notes to MD for upcoming appt. I will remove from the pre op call back pool.

## 2019-10-20 NOTE — Telephone Encounter (Signed)
Patient with diagnosis of afib on warfarin for anticoagulation.    Procedure: Cystoscopy with Transurethral Resection of the Prostate   Date of procedure: TBD  CHADS2-VASc score of  4 (CHF, AGE, CAD, AGE)  Per office protocol, patient can hold warfarin for 5 days prior to procedure.

## 2019-10-27 ENCOUNTER — Other Ambulatory Visit: Payer: Self-pay | Admitting: Urology

## 2019-10-27 NOTE — Progress Notes (Signed)
Cardiology Office Note    Date:  10/30/2019   ID:  Jason Day, DOB 26-Jan-1941, MRN [id number]  PCP:  Patient, No Pcp Per  Cardiologist:  Dr. Johnsie Cancel   Chief Complaint: Edema / CHF   History of Present Illness:   Jason Day is a 79 y.o. male CAD s/p CABG, ICM, PAF,  MR and HLD seen in f/u   He initially presented 09/2016 w/ acute pulmonary edema and diuresed. Echo showed reduced LVEF, down to 30%, leading to LHC, which showed severe multivessel CAD. 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 Post of afib >> on amiodarone and coumadin. He is claustrophobic and cannot have MRI for EF calculation  He has had recurrent afib and failed medical Rx with amiodarone LA 51 mm diameter on TTE Seen by [facility] 12/25/18 and ablation deferred plan for rate control and anticoagulation strategy Xarelto too expensive on coumadin now   Last echo 04/24/19  showed LVEF of 30-35%,mild / moderate MR  The patient had L rotator cuff arthropathy 08/21/18.   Currently his edema is from LE venous disease and not CHF  Had benign upper endoscopy and colonoscopy 08/04/19 Dr Penelope Coop   Needs cystoscopy Discussed holding coumadin 5 days before he has had an indwelling foley In for a couple weeks for retention   Past Medical History:  Diagnosis Date   Bleeding hemorrhoids    Chronic combined systolic and diastolic heart failure (HCC)    Chronic sinus complaints    overuses afrin   Coronary atherosclerosis of native coronary artery    Hyperlipidemia    Ischemic cardiomyopathy    Left atrial enlargement    Moderate mitral regurgitation    Myocardial infarction ([place]) 09/2015   Persistent atrial fibrillation (HCC)    RBBB     Past Surgical History:  Procedure Laterality Date   BACK SURGERY  ~2014   disc repair   CARDIOVERSION N/A 10/24/2018   Procedure: CARDIOVERSION;  Surgeon: Jerline Pain, MD;  Location: [place];  Service: Cardiovascular;  Laterality: N/A;   CHOLECYSTECTOMY     COLONOSCOPY  04/06/2014    Hemorrhoids and diverticulosis performed in [place] N/A 08/04/2019   Procedure: COLONOSCOPY WITH PROPOFOL;  Surgeon: Wonda Horner, MD;  [facility];  Service: Endoscopy;  Laterality: N/A;   CORONARY ARTERY BYPASS GRAFT N/A 10/18/2016   Procedure: CORONARY ARTERY BYPASS GRAFTING (CABG) x five , using left internal mammary artery and right leg greater saphenous vein harvested endoscopically;  Surgeon: Gaye Pollack, MD;  Location: [facility];  Service: Open Heart Surgery;  Laterality: N/A;   ESOPHAGOGASTRODUODENOSCOPY (EGD) WITH PROPOFOL N/A 08/04/2019   Procedure: ESOPHAGOGASTRODUODENOSCOPY (EGD) WITH PROPOFOL;  Surgeon: Wonda Horner, MD;  [facility];  Service: Endoscopy;  Laterality: N/A;   FOOT SURGERY     HEMORRHOID BANDING     HEMORRHOID SURGERY     REVERSE SHOULDER ARTHROPLASTY Left 08/21/2018   Procedure: REVERSE SHOULDER ARTHROPLASTY;  Surgeon: Justice Britain, MD;  [facility];  Service: Orthopedics;  Laterality: Left;   RIGHT/LEFT HEART CATH AND CORONARY ANGIOGRAPHY N/A 10/16/2016   Procedure: RIGHT/LEFT HEART CATH AND CORONARY ANGIOGRAPHY;  Surgeon: Belva Crome, MD;  Location: [facility] [facility];  Service: Cardiovascular;  Laterality: N/A;   stents in liver     TEE WITHOUT CARDIOVERSION N/A 10/18/2016   Procedure: TRANSESOPHAGEAL ECHOCARDIOGRAM (TEE);  Surgeon: Gaye Pollack, MD;  Location: [facility];  Service: Open Heart Surgery;  Laterality: N/A;  Current Medications:  Prior to Admission medications   Medication Sig Start Date End Date Taking? Authorizing Provider  atorvastatin (LIPITOR) 20 MG tablet Take 0.5 tablets (10 mg total) by mouth daily. Patient taking differently: Take 10 mg by mouth every evening.  07/29/18  Yes Josue Hector, MD  digoxin (LANOXIN) 0.125 MG tablet Take 0.5 tablets (62.5 mcg total) by mouth daily. 06/26/18  Yes Josue Hector, MD  metoprolol succinate (TOPROL-XL) 25 MG 24 hr tablet Take 0.5  tablets (12.5 mg total) by mouth daily. Patient taking differently: Take 12.5 mg by mouth at bedtime.  06/26/18  Yes Josue Hector, MD  sacubitril-valsartan (ENTRESTO) 49-51 MG Take 1 tablet by mouth 2 (two) times daily. 07/29/18  Yes Josue Hector, MD  spironolactone (ALDACTONE) 25 MG tablet Take 0.5 tablets (12.5 mg total) by mouth daily. Patient taking differently: Take 12.5 mg by mouth every evening.  06/26/18  Yes Josue Hector, MD  warfarin (COUMADIN) 5 MG tablet Take as directed by [facility] Patient taking differently: Take 5 mg by mouth every evening. Take as directed by [facility] 06/26/18  Yes Josue Hector, MD   Allergies:   Patient has no known allergies.   Social History   Socioeconomic History   Marital status: Married    Spouse name: Not on file   Number of children: 2   Years of education: Not on file   Highest education level: Not on file  Occupational History   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  Tobacco Use   Smoking status: Former Smoker    Packs/day: 0.50    Types: Cigarettes    Quit date: 1970    Years since quitting: 51.7   Smokeless tobacco: Never Used   Tobacco comment: Smoked on and off  Scientific laboratory technician Use: Never used  Substance and Sexual Activity   Alcohol use: No   Drug use: No   Sexual activity: Yes  Other Topics Concern   Not on file  Social History Narrative   Married    Originally from [place] moved to [place] age 87 moved to [place]   1 son   1 estranged daughter      Has living will   Wife is health care POA--then son   Would accept resuscitation   No tube feeds if cognitively unaware   Social Determinants of Health   Financial Resource Strain:    Difficulty of Paying Living Expenses: Not on file  Food Insecurity:    Worried About Charity fundraiser in the Last Year: Not on file   [employer] of Food in the Last Year: Not on file  Transportation Needs:    Lack  of Transportation (Medical): Not on file   Lack of Transportation (Non-Medical): Not on file  Physical Activity:    Days of Exercise per Week: Not on file   Minutes of Exercise per Session: Not on file  Stress:    Feeling of Stress : Not on file  Social Connections:    Frequency of Communication with Friends and Family: Not on file   Frequency of Social Gatherings with Friends and Family: Not on file   Attends Religious Services: Not on file   Active Member of Clubs or Organizations: Not on file   Attends Archivist Meetings: Not on file   Marital Status: Not on file     Family History:  The patient's family history includes  COPD in his father; Emphysema in his father; Healthy in his brother.   ROS:   Please see the history of present illness.    ROS All other systems reviewed and are negative.   PHYSICAL EXAM:   VS:  BP 118/66    Pulse 66    Ht 6' (1.829 m)    Wt 167 lb (75.8 kg)    SpO2 95%    BMI 22.65 kg/m     Affect appropriate Healthy:  appears stated age 47: normal Neck supple with no adenopathy JVP normal no bruits no thyromegaly Lungs clear with no wheezing and good diaphragmatic motion Heart:  S1/S2 apical MR murmur, no rub, gallop or click PMI  Enlarged  post sternotomy  Abdomen: benighn, BS positve, no tenderness, no AAA no bruit.  No HSM or HJR Foley catheter strapped to right leg  Distal pulses intact with no bruits Plus one edema bad varicosities bilaterally chronic stasis  Neuro non-focal Skin warm and dry No muscular weakness   Wt Readings from Last 3 Encounters:  10/30/19 167 lb (75.8 kg)  08/19/19 180 lb (81.6 kg)  08/04/19 175 lb 0.7 oz (79.4 kg)      Studies/Labs Reviewed:   EKG:  9/10/21afib RBBB LAD old IMI rate 66   Recent Labs: 09/16/2019: BUN 20; Creatinine, Ser 1.42; Hemoglobin 13.6; Platelets 99; Potassium 4.2; Sodium 144   Lipid Panel    Component Value Date/Time   CHOL 104 10/21/2018 1117   TRIG 91  10/21/2018 1117   HDL 37 (L) 10/21/2018 1117   CHOLHDL 2.8 10/21/2018 1117   CHOLHDL 6 04/22/2017 1431   VLDL 9 10/15/2016 1155   LDLCALC 49 10/21/2018 1117   LDLDIRECT 119.0 04/22/2017 1431    Additional studies/ records that were reviewed today include:   Echocardiogram: 04/2018  1. The left ventricle has moderate-severely reduced systolic function, with an ejection fraction of 30-35%. The cavity size was mildly dilated. There is moderately increased left ventricular wall thickness. Left ventricular diastolic Doppler parameters  are consistent with pseudonormalization. Basal inferoseptal akinesis, basal inferior and basal inferolateral aneurysm. Mid inferior and mid inferolateral akinesis. 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  2. Left atrial size was moderately dilated.  3. Right atrial size was mildly dilated.  4. There is mild mitral annular calcification present. Mitral valve regurgitation is moderate by color flow Doppler. Suspect infarct-related MR in the setting of inferior/inferolateral wall motion abnormalities. No evidence of mitral valve stenosis.  5. The aortic valve is tricuspid Mild calcification of the aortic valve. no stenosis of the aortic valve.  6. There is mild dilatation of the ascending aorta measuring 40 mm.  7. The IVC was normal in size. PA systolic pressure 39 mmHg.    ASSESSMENT & PLAN:    1. Acute on chronic combined CHF  - LVEF of 30-35% by last echo 06/24/19. On excellent Rx with digoxin, lasix, aldactone Toprol and entresto will update echo since meds optimized   2. CAD s/p CABG -Recent EKG with nonspecific changes.  Patient denies any anginal symptoms.  Should be on 81 mg ASA   3 PAF - on coumadin , Xarelto too expensive Failed cardioversion and medical Rx with amiodarone Seen by Dr Rayann Heman 12/25/18 and ablation deferred adopted strategy of rate control and anticoagulation LA dimension by  echo 51 mm INR have been suboptima as high as 8.2 earlier in month and last 2 sub Rx  5.  Mitral regurgitation  -ischemic  Mild / moderate by echo 06/24/19   6. Edema:  Discussed seeing vein doctors at [facility] Compression stockings and diuretic with elevation of legs   7. Urology:  F/u Jeffie Pollock having procedure end of month continue with indwelling foley Hold coumadin 5 days before procedure Dr Jeffie Pollock to decide when to resume coumadin post procedure   Medication Adjustments/Labs and Tests Ordered:   Signed, Jenkins Rouge, MD  10/30/2019 10:26 AM    [facility] [address], [place], [place]  [address] Phone: [phone]; Fax: [phone]

## 2019-10-28 NOTE — Progress Notes (Signed)
Reviewed pt chart per pre-op due to surgery on 11-19-2019.  Noted pt has an ef 30%.  Per anesthesia ambulatory surgery center guidelines pt is not a surgery center candidate since his ef is below 40%.  Called and left message w/ Pam, OR scheduler for Dr Jeffie Pollock, informed pt would need to be done at main OR.

## 2019-10-30 ENCOUNTER — Encounter: Payer: Self-pay | Admitting: Cardiovascular Disease

## 2019-10-30 ENCOUNTER — Other Ambulatory Visit: Payer: Self-pay

## 2019-10-30 ENCOUNTER — Ambulatory Visit (INDEPENDENT_AMBULATORY_CARE_PROVIDER_SITE_OTHER): Payer: Medicare Other | Admitting: Cardiovascular Disease

## 2019-10-30 VITALS — BP 118/66 | HR 66 | Ht 72.0 in | Wt 167.0 lb

## 2019-10-30 DIAGNOSIS — I5042 Chronic combined systolic (congestive) and diastolic (congestive) heart failure: Secondary | ICD-10-CM

## 2019-10-30 DIAGNOSIS — I255 Ischemic cardiomyopathy: Secondary | ICD-10-CM

## 2019-10-30 DIAGNOSIS — I480 Paroxysmal atrial fibrillation: Secondary | ICD-10-CM

## 2019-10-30 DIAGNOSIS — Z951 Presence of aortocoronary bypass graft: Secondary | ICD-10-CM | POA: Diagnosis not present

## 2019-10-30 DIAGNOSIS — I340 Nonrheumatic mitral (valve) insufficiency: Secondary | ICD-10-CM

## 2019-10-30 NOTE — Patient Instructions (Signed)

## 2019-11-03 DIAGNOSIS — Z23 Encounter for immunization: Secondary | ICD-10-CM | POA: Diagnosis not present

## 2019-11-03 NOTE — Patient Instructions (Addendum)
DUE TO COVID-19 ONLY ONE VISITOR IS ALLOWED TO COME WITH YOU AND STAY IN THE WAITING ROOM ONLY DURING PRE OP AND PROCEDURE DAY OF SURGERY. THE 1 VISITOR  MAY VISIT WITH YOU AFTER SURGERY IN YOUR PRIVATE ROOM DURING VISITING HOURS ONLY!  YOU NEED TO HAVE A COVID 19 TEST ON__9/27_____ @_9 :55______, THIS TEST MUST BE DONE BEFORE SURGERY,  COVID TESTING SITE [place] [id number], IT IS ON THE RIGHT GOING OUT [address] APPROXIMATELY  2 MINUTES PAST [employer] ON THE RIGHT. ONCE YOUR COVID TEST IS COMPLETED,  PLEASE BEGIN THE QUARANTINE INSTRUCTIONS AS OUTLINED IN YOUR HANDOUT.                Jason Day    Your procedure is scheduled on: 11/19/19   Report to [facility] Main  Entrance   Report to admitting at  7:30 AM     Call this number if you have problems the morning of surgery [phone]    Remember: Do not eat food or drink liquids :After Midnight.  RUSH YOUR TEETH MORNING OF SURGERY AND RINSE YOUR MOUTH OUT, NO CHEWING GUM CANDY OR MINTS.     Take these medicines the morning of surgery with A SIP OF WATER: Metoprolol, Digoxin                                 You may not have any metal on your body including              piercings  Do not wear jewelry,  lotions, powders or deodorant                           Men may shave face and neck.   Do not bring valuables to the hospital. [place].  Contacts, dentures or bridgework may not be worn into surgery.                    Please read over the following fact sheets you were given: _____________________________________________________________________             [facility] - Preparing for Surgery Before surgery, you can play an important role.   Because skin is not sterile, your skin needs to be as free of germs as possible.   You can reduce the number of germs on your skin by washing with CHG (chlorahexidine gluconate) soap  before surgery.   CHG is an antiseptic cleaner which kills germs and bonds with the skin to continue killing germs even after washing. Please DO NOT use if you have an allergy to CHG or antibacterial soaps .  If your skin becomes reddened/irritated stop using the CHG and inform your nurse when you arrive at Short Stay.   You may shave your face/neck.  Please follow these instructions carefully:  1.  Shower with CHG Soap the night before surgery and the  morning of Surgery.  2.  If you choose to wash your hair, wash your hair first as usual with your  normal  shampoo.  3.  After you shampoo, rinse your hair and body thoroughly to remove the  shampoo.  4.  Use CHG as you would any other liquid soap.  You can apply chg directly  to the skin and wash                       Gently with a scrungie or clean washcloth.  5.  Apply the CHG Soap to your body ONLY FROM THE NECK DOWN.   Do not use on face/ open                           Wound or open sores. Avoid contact with eyes, ears mouth and genitals (private parts).                       Wash face,  Genitals (private parts) with your normal soap.             6.  Wash thoroughly, paying special attention to the area where your surgery  will be performed.  7.  Thoroughly rinse your body with warm water from the neck down.  8.  DO NOT shower/wash with your normal soap after using and rinsing off  the CHG Soap.             9.  Pat yourself dry with a clean towel.            10.  Wear clean pajamas.            11.  Place clean sheets on your bed the night of your first shower and do not  sleep with pets. Day of Surgery : Do not apply any lotions/deodorants the morning of surgery.  Please wear clean clothes to the hospital/surgery center.  FAILURE TO FOLLOW THESE INSTRUCTIONS MAY RESULT IN THE CANCELLATION OF YOUR SURGERY PATIENT SIGNATURE_________________________________  NURSE  SIGNATURE__________________________________  ________________________________________________________________________

## 2019-11-05 ENCOUNTER — Ambulatory Visit (INDEPENDENT_AMBULATORY_CARE_PROVIDER_SITE_OTHER): Payer: Medicare Other | Admitting: *Deleted

## 2019-11-05 ENCOUNTER — Other Ambulatory Visit: Payer: Self-pay

## 2019-11-05 DIAGNOSIS — Z951 Presence of aortocoronary bypass graft: Secondary | ICD-10-CM | POA: Diagnosis not present

## 2019-11-05 DIAGNOSIS — I4819 Other persistent atrial fibrillation: Secondary | ICD-10-CM | POA: Diagnosis not present

## 2019-11-05 DIAGNOSIS — Z5181 Encounter for therapeutic drug level monitoring: Secondary | ICD-10-CM

## 2019-11-05 LAB — POCT INR: INR: 2.1 (ref 2.0–3.0)

## 2019-11-05 NOTE — Patient Instructions (Addendum)
Description   Continue taking 1 tablet daily except for 1/2 tablet on Sundays and Thursdays. Recheck INR in 1.5 weeks post procedure. Call coumadin clinic for any changes in medications or upcoming procedures. [phone]    Last dose to take Warfarin is on 11/13/2019 for procedure. Then resume Warfarin at regular dose or per Dr. Jetty Duhamel.

## 2019-11-10 ENCOUNTER — Encounter (HOSPITAL_COMMUNITY): Payer: Self-pay

## 2019-11-10 ENCOUNTER — Other Ambulatory Visit: Payer: Self-pay

## 2019-11-10 ENCOUNTER — Encounter (HOSPITAL_COMMUNITY)
Admission: RE | Admit: 2019-11-10 | Discharge: 2019-11-10 | Disposition: A | Discharge status: Home / Self Care | Payer: Medicare Other | Source: Ambulatory Visit | Attending: Urology | Admitting: Urology

## 2019-11-10 DIAGNOSIS — Z01812 Encounter for preprocedural laboratory examination: Secondary | ICD-10-CM | POA: Insufficient documentation

## 2019-11-10 HISTORY — DX: Dyspnea, unspecified: R06.00

## 2019-11-10 LAB — CBC
HCT: 41.3 % (ref 39.0–52.0)
Hemoglobin: 13.6 g/dL (ref 13.0–17.0)
MCH: 30.9 pg (ref 26.0–34.0)
MCHC: 32.9 g/dL (ref 30.0–36.0)
MCV: 93.9 fL (ref 80.0–100.0)
Platelets: 97 10*3/uL — ABNORMAL LOW (ref 150–400)
RBC: 4.4 MIL/uL (ref 4.22–5.81)
RDW: 14.6 % (ref 11.5–15.5)
WBC: 5.2 10*3/uL (ref 4.0–10.5)
nRBC: 0 % (ref 0.0–0.2)

## 2019-11-10 LAB — BASIC METABOLIC PANEL
Anion gap: 11 (ref 5–15)
BUN: 23 mg/dL (ref 8–23)
CO2: 27 mmol/L (ref 22–32)
Calcium: 8.8 mg/dL — ABNORMAL LOW (ref 8.9–10.3)
Chloride: 102 mmol/L (ref 98–111)
Creatinine, Ser: 1.14 mg/dL (ref 0.61–1.24)
GFR calc Af Amer: >60 mL/min (ref >60)
GFR calc non Af Amer: >60 mL/min (ref >60)
Glucose, Bld: 117 mg/dL — ABNORMAL HIGH (ref 70–99)
Potassium: 4.6 mmol/L (ref 3.5–5.1)
Sodium: 140 mmol/L (ref 135–145)

## 2019-11-10 NOTE — Progress Notes (Signed)
COVID Vaccine Completed:yes Date COVID Vaccine completed: 2nd dose 2/21 and booster 11/05/19 COVID vaccine manufacturer: [employer]     PCP -no Cardiologist - Dr. Edmonia James Clearance is in [place] 10/30/19  Chest x-ray - 2018- Epic EKG - 10/30/19-Epic Stress Test - no ECHO - 06/24/19-Epic Cardiac Cath - 2018, CABG x3 2018 Pacemaker/ICD device last checked:NA  Sleep Study - no CPAP -   Fasting Blood Sugar - NA Checks Blood Sugar _____ times a day  Blood Thinner Instructions: Coumadin/ Dr Johnsie Cancel  Instructions:Hold for 5 days prior/ Dr. Johnsie Cancel Last Dose:11/13/22  Anesthesia review:   Patient denies shortness of breath, fever, cough and chest pain at PAT appointment Yes   Patient verbalized understanding of instructions that were given to them at the PAT appointment. Patient was also instructed that they will need to review over the PAT instructions again at home before surgery. Yes The Pt had a bad experience at [facility] after shoulder surgery. All he wants this time is a bed and a TV.  He has SOB with activity since his Heart surgery but not with ADLs

## 2019-11-12 NOTE — Progress Notes (Signed)
Anesthesia Chart Review   Case: [id number] Date/Time: 11/19/19 0915   Procedure: CYSTOSCOPY TRANSURETHRAL RESECTION OF THE PROSTATE (TURP) (N/A )   Anesthesia type: General   Pre-op diagnosis: BENIGN PROSTATE HYPERPLASIA WITH RETENTION   [facility] / [facility]   Surgeons: [name], MD      DISCUSSION:79 y.o. former smoker (quit 02/20/68) with h/o a-fib (on Coumadin), CAD s/p CABG 2018, ischemic cardiomyopathy (EF 30-35% on Echo 06/24/2019), moderate MR, BPH with retention scheduled for above procedure 11/19/2019 with Dr. [name].   Pt last seen by cardiology 10/30/2019.  Per OV note, "F/u Jason Day having procedure end of month continue with indwelling foley Hold coumadin 5 days before procedure Dr Jason Day to decide when to resume coumadin post procedure."   Anticipate pt can proceed with planned procedure barring acute status change.   VS: BP (!) 116/59   Pulse 69   Temp 37.1 C (Oral)   Resp 20   Ht 6' (1.829 m)   Wt 77.1 kg   SpO2 100%   BMI 23.06 kg/m   PROVIDERS: Patient, No Pcp Per  Jenkins Rouge, MD is Cardiologist  LABS: Labs reviewed: Acceptable for surgery. (all labs ordered are listed, but only abnormal results are displayed)  Labs Reviewed  CBC - Abnormal; Notable for the following components:      Result Value   Platelets 97 (*)    All other components within normal limits     IMAGES:   EKG: 10/30/2019 Rate 66 bpm    CV: Echo 06/24/2019 IMPRESSIONS    1. There is density seen on contrast imaging in the basal segment of the  LV aneurysm. Suggests chronic laminated thrombus. This appears to be  smaller as compared to prior echo.. Left ventricular ejection fraction, by  estimation, is 30 to 35%. The left  ventricle has moderately decreased function. The left ventricle  demonstrates regional wall motion abnormalities (see scoring  diagram/findings for description). The left ventricular internal cavity  size was moderately dilated. There is moderate  asymmetric  left ventricular hypertrophy of the basal-septal segment. Indeterminate  diastolic filling due to E-A fusion.  2. Right ventricular systolic function is normal. The right ventricular  size is normal. There is normal pulmonary artery systolic pressure.  3. Left atrial size was severely dilated.  4. Right atrial size was severely dilated.  5. The mitral valve is degenerative. Mild to moderate mitral valve  regurgitation. No evidence of mitral stenosis.  6. The aortic valve is tricuspid. Aortic valve regurgitation is not  visualized. Mild aortic valve sclerosis is present, with no evidence of  aortic valve stenosis.  Past Medical History:  Diagnosis Date  . Bleeding hemorrhoids   . Chronic combined systolic and diastolic heart failure ([place])   . Chronic sinus complaints    overuses afrin  . Coronary atherosclerosis of native coronary artery   . Dyspnea   . Hyperlipidemia   . Ischemic cardiomyopathy   . Left atrial enlargement   . Moderate mitral regurgitation   . Myocardial infarction ([place]) 09/2015  . Persistent atrial fibrillation ([place])   . RBBB    A- Fib    Past Surgical History:  Procedure Laterality Date  . BACK SURGERY  ~2014   disc repair  . CARDIOVERSION N/A 10/24/2018   Procedure: CARDIOVERSION;  Surgeon: Jerline Pain, MD;  Location: [facility] ENDOSCOPY;  Service: Cardiovascular;  Laterality: N/A;  . CHOLECYSTECTOMY    . COLONOSCOPY  04/06/2014   Hemorrhoids and diverticulosis  performed in [place]  . COLONOSCOPY WITH PROPOFOL N/A 08/04/2019   Procedure: COLONOSCOPY WITH PROPOFOL;  Surgeon: Wonda Horner, MD;  [facility];  Service: Endoscopy;  Laterality: N/A;  . CORONARY ARTERY BYPASS GRAFT N/A 10/18/2016   Procedure: CORONARY ARTERY BYPASS GRAFTING (CABG) x five , using left internal mammary artery and right leg greater saphenous vein harvested endoscopically;  Surgeon: Gaye Pollack, MD;  Location: [place] [facility];  Service: Open Heart Surgery;   Laterality: N/A;  . ESOPHAGOGASTRODUODENOSCOPY (EGD) WITH PROPOFOL N/A 08/04/2019   Procedure: ESOPHAGOGASTRODUODENOSCOPY (EGD) WITH PROPOFOL;  Surgeon: Wonda Horner, MD;  [facility];  Service: Endoscopy;  Laterality: N/A;  . FOOT SURGERY    . HEMORRHOID BANDING    . HEMORRHOID SURGERY    . REVERSE SHOULDER ARTHROPLASTY Left 08/21/2018   Procedure: REVERSE SHOULDER ARTHROPLASTY;  Surgeon: Justice Britain, MD;  [facility];  Service: Orthopedics;  Laterality: Left;  . RIGHT/LEFT HEART CATH AND CORONARY ANGIOGRAPHY N/A 10/16/2016   Procedure: RIGHT/LEFT HEART CATH AND CORONARY ANGIOGRAPHY;  Surgeon: Belva Crome, MD;  Location: [place] CV LAB;  Service: Cardiovascular;  Laterality: N/A;  . stents in liver    . TEE WITHOUT CARDIOVERSION N/A 10/18/2016   Procedure: TRANSESOPHAGEAL ECHOCARDIOGRAM (TEE);  Surgeon: Gaye Pollack, MD;  Location: [place];  Service: Open Heart Surgery;  Laterality: N/A;    MEDICATIONS: . atorvastatin (LIPITOR) 20 MG tablet  . digoxin (LANOXIN) 0.125 MG tablet  . finasteride (PROSCAR) 5 MG tablet  . glycopyrrolate (ROBINUL) 2 MG tablet  . metoprolol succinate (TOPROL-XL) 25 MG 24 hr tablet  . sacubitril-valsartan (ENTRESTO) 49-51 MG  . spironolactone (ALDACTONE) 25 MG tablet  . warfarin (COUMADIN) 5 MG tablet   No current facility-administered medications for this encounter.    Jason Felix, PA-C [facility] [phone]

## 2019-11-16 ENCOUNTER — Telehealth: Payer: Self-pay | Admitting: Cardiovascular Disease

## 2019-11-16 ENCOUNTER — Other Ambulatory Visit (HOSPITAL_COMMUNITY)
Admission: RE | Admit: 2019-11-16 | Discharge: 2019-11-16 | Disposition: A | Discharge status: Home / Self Care | Payer: Medicare Other | Source: Ambulatory Visit | Attending: Urology | Admitting: Urology

## 2019-11-16 DIAGNOSIS — Z01812 Encounter for preprocedural laboratory examination: Secondary | ICD-10-CM | POA: Insufficient documentation

## 2019-11-16 DIAGNOSIS — Z20822 Contact with and (suspected) exposure to covid-19: Secondary | ICD-10-CM | POA: Diagnosis not present

## 2019-11-16 LAB — SARS CORONAVIRUS 2 (TAT 6-24 HRS): SARS Coronavirus 2: NEGATIVE

## 2019-11-16 NOTE — Telephone Encounter (Signed)
I spoke with patient.  He is calling to see why he is on spironolactone.  I told him this was to help with his CHF and low EF

## 2019-11-16 NOTE — Telephone Encounter (Signed)
Patient returning call.

## 2019-11-16 NOTE — Telephone Encounter (Signed)
Left message to call office to clarify medications he had called about

## 2019-11-16 NOTE — Telephone Encounter (Signed)
New message  Pt c/o medication issue:  1. Name of Medication: spironolactone (ALDACTONE) 25 MG tablet [[id number]]    2. How are you currently taking this medication (dosage and times per day)? sacubitril-valsartan (ENTRESTO) 49-51 MG [[id number]]    3. Are you having a reaction (difficulty breathing--STAT)? No    4. What is your medication issue? Pt would like to know what this med is and why he is taking.  He does not understand why he is taking this med.  He stated if he does not answer, It is ok to leave it on his VM    [phone] best cb number

## 2019-11-18 NOTE — H&P (Signed)
I have urinary retention.  HPI: Jason Day is a 79 year-old male established patient who is here for urinary retention.    GU hx: Jason Day is a 79 yo WM who presented to the ER on 7/28 with a 1 week history of voiding difficulty and was found to be in retention with >961ml on bladder scan. He now has a foley. He was voiding without complaints other than chronic nocturia prior to this episode and has no prior GU history. He was started on Glycopyrrolate, an anticholinergic, for his bowels for about a week prior. He had a history of frequent BM's. He has stopped the glycopyrrolate prior to the ER visit. He had a PSA in 2019 that was 3.78 and his Cr in the ER was 1.42 which was up from 1.3.    09/24/19: Patient with above noted hx. He returns today for TOV. He is currently on Silodosin 4 mg and has stopped the Glycopyrrolate. He states that he was tolerating the catheter well and denied any gross hematuria. No fevers or chills.   09/30/19": Patient with above-noted history. He returns today for repeat trial of void. He had unsuccessful attempt approximately 1 week ago. Urine culture, which was obtained at catheter reinserted in last week was noted to be positive for Klebsiella oxytoca. He is currently using Macrobid for treatment prior to repeat trial of void. He remains on silodosin, as previously prescribed. He states that his catheter has been draining appropriately, though he is tolerating it poorly. He states that it is been significantly uncomfortable. He denies gross hematuria. No complaints of fevers or chills. He seems very frustrated today that no one is giving him any definitive answers.   10/15/19: Jason Day returns today in f/u from urodynamic testing for a prostate [place] and possible cystocopy. 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 He was only able to void a small amount with a reduced stream. He has delayed sensation with some loss of compliance but no instability.  the prostate [place] today demonstrated no worrisome lesions and the volume is 46ml.     ALLERGIES: None   MEDICATIONS: Metoprolol Succinate 25 mg tablet, extended release 24 hr  Warfarin Sodium  Atorvastatin Calcium 20 mg tablet  Digoxin 125 mcg (0.125 mg) tablet  Entresto 49 mg-51 mg tablet  Glycopyrrolate 2 mg tablet  Silodosin 1 capsule PO Daily  Spironolactone     GU PSH: Complex cystometrogram, w/ void pressure and urethral pressure profile studies, any technique - 10/12/2019 Complex Uroflow - 10/12/2019 Emg surf Electrd - 10/12/2019 Inject For cystogram - 10/12/2019 Intrabd voidng Press - 10/12/2019     NON-GU PSH: Anesth, Shoulder Replacement Back surgery CABG (coronary artery bypass grafting) Eye Surgery (Unspecified) Foot surgery (unspecified) Hand/finger Surgery     GU PMH: Urinary Retention, 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 He is off of that med. I will give him sildosoin 4mg  daily since it is a prostate specific alpha blocker that is less likely to cause vascular side effects. I reviewed the side effects. He will return later this week for a voiding trial, - 09/22/2019 BPH w/LUTS - 09/22/2019    NON-GU PMH: Arrhythmia Atrial Fibrillation Myocardial Infarction Other heart failure    FAMILY HISTORY: Death of family member - Father, Mother    Notes: 1 son; 1 daughter    SOCIAL HISTORY: Marital Status: Married Preferred Language: English; Ethnicity: Not Hispanic Or  Latino; Race: White Current Smoking Status: Patient has never smoked.   Tobacco Use Assessment Completed: Used Tobacco in last 30 days? Does not use smokeless tobacco. Has never drank.  Drinks 2 caffeinated drinks per day. Patient's occupation is/was retired.    REVIEW OF SYSTEMS:    GU Review Male:   Patient denies frequent urination, hard to postpone urination, burning/ pain with urination, get up at night to urinate, leakage of  urine, stream starts and stops, trouble starting your stream, have to strain to urinate , erection problems, and penile pain.  Gastrointestinal (Upper):   Patient denies nausea, vomiting, and indigestion/ heartburn.  Gastrointestinal (Lower):   Patient denies diarrhea and constipation.  Constitutional:   Patient denies fever, night sweats, weight loss, and fatigue.  Skin:   Patient denies skin rash/ lesion and itching.  Eyes:   Patient denies blurred vision and double vision.  Ears/ Nose/ Throat:   Patient denies sore throat and sinus problems.  Hematologic/Lymphatic:   Patient denies swollen glands and easy bruising.  Cardiovascular:   Patient denies leg swelling and chest pains.  Respiratory:   Patient denies cough and shortness of breath.  Endocrine:   Patient denies excessive thirst.  Musculoskeletal:   Patient denies back pain and joint pain.  Neurological:   Patient reports dizziness. Patient denies headaches.  Psychologic:   Patient denies anxiety and depression.   VITAL SIGNS:      10/15/2019 03:46 PM  BP 92/56 mmHg  Pulse 80 /min  Temperature 98.0 F / 36.6 C   Complexity of Data:  Records Review:   Previous Patient Records  Urine Test Review:   Urine Culture  Urodynamics Review:   Review Urodynamics Tests  X-Ray Review: Prostate Ultrasound: Reviewed Films. Reviewed Report. Discussed With Patient.     PROCEDURES:         Prostate Ultrasound - 35329  Length:5.53cm Height:4.91cm Width:5.43cm Volume:77.62ml      0.64cm Cystic Area Multiple Subcentimeter hyperechoic areas throughout prostate Patient confirmed No Neulasta OnPro Device.      The transrectal ultrasound probe is introduced into the rectum, and the prostate is visualized. Ultrasonography is utilized throughout the procedure. At the conclusion of the procedure, the ultrasound probe is removed. The patient tolerates the procedure without complication.   ASSESSMENT:      ICD-10 Details  1 GU:   BPH w/LUTS  - N40.1 Chronic, Stable - He has some detrusor function but was unable to void. The prostate is 7ml. I discussed options including finasteride and repeat voiding trials going forward or a TURP. He is most interested in the TURP but I will start finasteride to downsize the prostate and reduce bleeding risk. I will get him cleared by cardiology to come off of warfarin. I reviewd the risks of a TURP including bleeding, infection, incontinence, stricture, need for secondary procedures, ejaculatory and erectile dysfunction, thrombotic events, fluid overload and anesthetic complications. I explained that 95% of men will have relief of the obstructive symptoms and about 70% will have relief of the irritative symptoms.   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      PLAN:            Medications New Meds: Finasteride 5 mg tablet 1 tablet PO Daily   #90  1 Refill(s)  Levofloxacin 250 mg tablet 1 tablet PO Daily start 2 days prior to the procedure.  #3  0 Refill(s)  Schedule Return Visit/Planned Activity: Next Available Appointment - Schedule Surgery  Procedure: Unspecified Date - Cystoscopy TURP - 99579 Notes: Next available

## 2019-11-19 ENCOUNTER — Other Ambulatory Visit: Payer: Self-pay

## 2019-11-19 ENCOUNTER — Ambulatory Visit (HOSPITAL_COMMUNITY): Payer: Medicare Other

## 2019-11-19 ENCOUNTER — Observation Stay (HOSPITAL_COMMUNITY)
Admission: RE | Admit: 2019-11-19 | Discharge: 2019-11-20 | Disposition: A | Discharge status: Home / Self Care | Payer: Medicare Other | Attending: Urology | Admitting: Urology

## 2019-11-19 ENCOUNTER — Encounter (HOSPITAL_COMMUNITY): Payer: Self-pay | Admitting: Urology

## 2019-11-19 ENCOUNTER — Encounter (HOSPITAL_COMMUNITY)
Admission: RE | Disposition: A | Discharge status: Home / Self Care | Payer: Self-pay | Source: Home / Self Care | Attending: Urology

## 2019-11-19 DIAGNOSIS — R338 Other retention of urine: Secondary | ICD-10-CM | POA: Diagnosis not present

## 2019-11-19 DIAGNOSIS — I5089 Other heart failure: Secondary | ICD-10-CM | POA: Diagnosis not present

## 2019-11-19 DIAGNOSIS — Z951 Presence of aortocoronary bypass graft: Secondary | ICD-10-CM | POA: Insufficient documentation

## 2019-11-19 DIAGNOSIS — R330 Drug induced retention of urine: Secondary | ICD-10-CM | POA: Insufficient documentation

## 2019-11-19 DIAGNOSIS — Z87891 Personal history of nicotine dependence: Secondary | ICD-10-CM | POA: Insufficient documentation

## 2019-11-19 DIAGNOSIS — N401 Enlarged prostate with lower urinary tract symptoms: Principal | ICD-10-CM | POA: Insufficient documentation

## 2019-11-19 DIAGNOSIS — I25119 Atherosclerotic heart disease of native coronary artery with unspecified angina pectoris: Secondary | ICD-10-CM | POA: Insufficient documentation

## 2019-11-19 DIAGNOSIS — Z7901 Long term (current) use of anticoagulants: Secondary | ICD-10-CM | POA: Diagnosis not present

## 2019-11-19 DIAGNOSIS — Z79899 Other long term (current) drug therapy: Secondary | ICD-10-CM | POA: Diagnosis not present

## 2019-11-19 DIAGNOSIS — Z96619 Presence of unspecified artificial shoulder joint: Secondary | ICD-10-CM | POA: Insufficient documentation

## 2019-11-19 DIAGNOSIS — I4891 Unspecified atrial fibrillation: Secondary | ICD-10-CM | POA: Insufficient documentation

## 2019-11-19 DIAGNOSIS — N320 Bladder-neck obstruction: Secondary | ICD-10-CM | POA: Diagnosis not present

## 2019-11-19 DIAGNOSIS — E785 Hyperlipidemia, unspecified: Secondary | ICD-10-CM | POA: Diagnosis not present

## 2019-11-19 DIAGNOSIS — N138 Other obstructive and reflux uropathy: Secondary | ICD-10-CM | POA: Diagnosis not present

## 2019-11-19 HISTORY — PX: TRANSURETHRAL RESECTION OF PROSTATE: SHX73

## 2019-11-19 SURGERY — TURP (TRANSURETHRAL RESECTION OF PROSTATE)
Anesthesia: General

## 2019-11-19 MED ORDER — STERILE WATER FOR IRRIGATION IR SOLN
Status: DC | PRN
  Administered 2019-11-19: 250 mL

## 2019-11-19 MED ORDER — ATORVASTATIN CALCIUM 10 MG PO TABS
10.0000 mg | ORAL_TABLET | Freq: Every day | ORAL | Status: DC
  Administered 2019-11-19 – 2019-11-20 (×2): 10 mg via ORAL
  Filled 2019-11-19 (×2): qty 1

## 2019-11-19 MED ORDER — FENTANYL CITRATE (PF) 100 MCG/2ML IJ SOLN
INTRAMUSCULAR | Status: AC
  Filled 2019-11-19: qty 2

## 2019-11-19 MED ORDER — PROPOFOL 10 MG/ML IV BOLUS
INTRAVENOUS | Status: AC
  Filled 2019-11-19: qty 20

## 2019-11-19 MED ORDER — ONDANSETRON HCL 4 MG/2ML IJ SOLN
INTRAMUSCULAR | Status: AC
  Filled 2019-11-19: qty 2

## 2019-11-19 MED ORDER — POTASSIUM CHLORIDE IN NACL 20-0.45 MEQ/L-% IV SOLN
INTRAVENOUS | Status: DC
  Filled 2019-11-19 (×3): qty 1000

## 2019-11-19 MED ORDER — SPIRONOLACTONE 25 MG PO TABS
25.0000 mg | ORAL_TABLET | Freq: Every day | ORAL | Status: DC
  Administered 2019-11-19 – 2019-11-20 (×2): 25 mg via ORAL
  Filled 2019-11-19 (×2): qty 1

## 2019-11-19 MED ORDER — PROPOFOL 10 MG/ML IV BOLUS
INTRAVENOUS | Status: DC | PRN
  Administered 2019-11-19: 140 mg via INTRAVENOUS

## 2019-11-19 MED ORDER — 0.9 % SODIUM CHLORIDE (POUR BTL) OPTIME
TOPICAL | Status: DC | PRN
  Administered 2019-11-19: 1000 mL

## 2019-11-19 MED ORDER — PROMETHAZINE HCL 25 MG/ML IJ SOLN: 6.2500 mg | INTRAMUSCULAR | Status: DC | PRN

## 2019-11-19 MED ORDER — CIPROFLOXACIN HCL 250 MG PO TABS
250.0000 mg | ORAL_TABLET | Freq: Two times a day (BID) | ORAL | 0 refills | Status: DC
Start: 2019-11-19 — End: 2019-11-22

## 2019-11-19 MED ORDER — DIGOXIN 0.0625 MG HALF TABLET
0.0625 mg | ORAL_TABLET | Freq: Every day | ORAL | Status: DC
  Administered 2019-11-19: 0.0625 mg via ORAL
  Filled 2019-11-19 (×3): qty 1

## 2019-11-19 MED ORDER — FENTANYL CITRATE (PF) 100 MCG/2ML IJ SOLN
INTRAMUSCULAR | Status: DC | PRN
Start: 2019-11-19 — End: 2019-11-19
  Administered 2019-11-19: 50 ug via INTRAVENOUS
  Administered 2019-11-19 (×2): 25 ug via INTRAVENOUS

## 2019-11-19 MED ORDER — ONDANSETRON HCL 4 MG/2ML IJ SOLN: 4.0000 mg | INTRAMUSCULAR | Status: DC | PRN

## 2019-11-19 MED ORDER — CHLORHEXIDINE GLUCONATE CLOTH 2 % EX PADS: 6.0000 | MEDICATED_PAD | Freq: Every day | CUTANEOUS | Status: DC

## 2019-11-19 MED ORDER — FENTANYL CITRATE (PF) 100 MCG/2ML IJ SOLN: 25.0000 ug | INTRAMUSCULAR | Status: DC | PRN

## 2019-11-19 MED ORDER — HYOSCYAMINE SULFATE 0.125 MG SL SUBL
0.1250 mg | SUBLINGUAL_TABLET | SUBLINGUAL | Status: DC | PRN
  Filled 2019-11-19: qty 1

## 2019-11-19 MED ORDER — PHENYLEPHRINE HCL-NACL 10-0.9 MG/250ML-% IV SOLN
INTRAVENOUS | Status: DC | PRN
  Administered 2019-11-19: 25 ug/min via INTRAVENOUS

## 2019-11-19 MED ORDER — HYDROMORPHONE HCL 1 MG/ML IJ SOLN: 0.5000 mg | INTRAMUSCULAR | Status: DC | PRN

## 2019-11-19 MED ORDER — SODIUM CHLORIDE 0.9 % IR SOLN
Status: DC | PRN
  Administered 2019-11-19: 24000 mL

## 2019-11-19 MED ORDER — LIDOCAINE 2% (20 MG/ML) 5 ML SYRINGE
INTRAMUSCULAR | Status: AC
  Filled 2019-11-19: qty 5

## 2019-11-19 MED ORDER — EPHEDRINE SULFATE-NACL 50-0.9 MG/10ML-% IV SOSY
PREFILLED_SYRINGE | INTRAVENOUS | Status: DC | PRN
  Administered 2019-11-19: 10 mg via INTRAVENOUS

## 2019-11-19 MED ORDER — ORAL CARE MOUTH RINSE: 15.0000 mL | Freq: Once | OROMUCOSAL | Status: AC

## 2019-11-19 MED ORDER — LACTATED RINGERS IV SOLN: INTRAVENOUS | Status: DC

## 2019-11-19 MED ORDER — CHLORHEXIDINE GLUCONATE 0.12 % MT SOLN
15.0000 mL | Freq: Once | OROMUCOSAL | Status: AC
  Administered 2019-11-19: 15 mL via OROMUCOSAL

## 2019-11-19 MED ORDER — FINASTERIDE 5 MG PO TABS
5.0000 mg | ORAL_TABLET | Freq: Every day | ORAL | Status: DC
  Administered 2019-11-19 – 2019-11-20 (×2): 5 mg via ORAL
  Filled 2019-11-19 (×2): qty 1

## 2019-11-19 MED ORDER — ONDANSETRON HCL 4 MG/2ML IJ SOLN
INTRAMUSCULAR | Status: DC | PRN
  Administered 2019-11-19: 4 mg via INTRAVENOUS

## 2019-11-19 MED ORDER — BISACODYL 10 MG RE SUPP: 10.0000 mg | Freq: Every day | RECTAL | Status: DC | PRN

## 2019-11-19 MED ORDER — GLYCOPYRROLATE 1 MG PO TABS
2.0000 mg | ORAL_TABLET | Freq: Two times a day (BID) | ORAL | Status: DC
  Administered 2019-11-19 (×2): 2 mg via ORAL
  Filled 2019-11-19 (×4): qty 2

## 2019-11-19 MED ORDER — METOPROLOL SUCCINATE ER 25 MG PO TB24
12.5000 mg | ORAL_TABLET | Freq: Every day | ORAL | Status: DC
  Administered 2019-11-19 – 2019-11-20 (×2): 12.5 mg via ORAL
  Filled 2019-11-19 (×2): qty 1

## 2019-11-19 MED ORDER — OXYCODONE HCL 5 MG PO TABS: 5.0000 mg | ORAL_TABLET | ORAL | Status: DC | PRN

## 2019-11-19 MED ORDER — CIPROFLOXACIN IN D5W 400 MG/200ML IV SOLN
400.0000 mg | INTRAVENOUS | Status: AC
  Administered 2019-11-19: 400 mg via INTRAVENOUS
  Filled 2019-11-19: qty 200

## 2019-11-19 MED ORDER — CIPROFLOXACIN HCL 250 MG PO TABS
250.0000 mg | ORAL_TABLET | Freq: Two times a day (BID) | ORAL | Status: DC
  Administered 2019-11-19 – 2019-11-20 (×2): 250 mg via ORAL
  Filled 2019-11-19 (×2): qty 1

## 2019-11-19 MED ORDER — DIGOXIN 125 MCG PO TABS: 0.0625 mg | ORAL_TABLET | Freq: Every day | ORAL | Status: DC

## 2019-11-19 MED ORDER — SENNOSIDES-DOCUSATE SODIUM 8.6-50 MG PO TABS: 1.0000 | ORAL_TABLET | Freq: Every evening | ORAL | Status: DC | PRN

## 2019-11-19 MED ORDER — ACETAMINOPHEN 325 MG PO TABS: 650.0000 mg | ORAL_TABLET | ORAL | Status: DC | PRN

## 2019-11-19 MED ORDER — FLEET ENEMA 7-19 GM/118ML RE ENEM: 1.0000 | ENEMA | Freq: Once | RECTAL | Status: DC | PRN

## 2019-11-19 MED ORDER — DEXAMETHASONE SODIUM PHOSPHATE 10 MG/ML IJ SOLN
INTRAMUSCULAR | Status: AC
  Filled 2019-11-19: qty 1

## 2019-11-19 MED ORDER — SACUBITRIL-VALSARTAN 49-51 MG PO TABS
1.0000 | ORAL_TABLET | Freq: Two times a day (BID) | ORAL | Status: DC
  Administered 2019-11-19 (×2): 1 via ORAL
  Filled 2019-11-19 (×4): qty 1

## 2019-11-19 MED ORDER — DEXAMETHASONE SODIUM PHOSPHATE 10 MG/ML IJ SOLN
INTRAMUSCULAR | Status: DC | PRN
  Administered 2019-11-19: 8 mg via INTRAVENOUS

## 2019-11-19 MED ORDER — EPHEDRINE 5 MG/ML INJ
INTRAVENOUS | Status: AC
  Filled 2019-11-19: qty 10

## 2019-11-19 MED ORDER — LIDOCAINE 2% (20 MG/ML) 5 ML SYRINGE
INTRAMUSCULAR | Status: DC | PRN
  Administered 2019-11-19: 80 mg via INTRAVENOUS

## 2019-11-19 MED ORDER — SODIUM CHLORIDE 0.9 % IR SOLN: 3000.0000 mL | Status: DC

## 2019-11-19 SURGICAL SUPPLY — 18 items
BAG DRN RND TRDRP ANRFLXCHMBR (UROLOGICAL SUPPLIES)
BAG URINE DRAIN 2000ML AR STRL (UROLOGICAL SUPPLIES) IMPLANT
BAG URO CATCHER STRL LF (MISCELLANEOUS) ×3 IMPLANT
CATH FOLEY 3WAY 30CC 22FR (CATHETERS) ×3 IMPLANT
ELECT REM PT RETURN 15FT ADLT (MISCELLANEOUS) IMPLANT
GLOVE SURG SS PI 8.0 STRL IVOR (GLOVE) ×3 IMPLANT
GOWN STRL REUS W/TWL XL LVL3 (GOWN DISPOSABLE) ×3 IMPLANT
HOLDER FOLEY CATH W/STRAP (MISCELLANEOUS) ×3 IMPLANT
KIT TURNOVER KIT A (KITS) ×3 IMPLANT
LOOP CUT BIPOLAR 24F LRG (ELECTROSURGICAL) IMPLANT
MANIFOLD NEPTUNE II (INSTRUMENTS) ×3 IMPLANT
PACK CYSTO (CUSTOM PROCEDURE TRAY) ×3 IMPLANT
SYR 30ML LL (SYRINGE) ×3 IMPLANT
SYR TOOMEY IRRIG 70ML (MISCELLANEOUS) ×3
SYRINGE TOOMEY IRRIG 70ML (MISCELLANEOUS) ×1 IMPLANT
TUBING CONNECTING 10 (TUBING) ×2 IMPLANT
TUBING CONNECTING 10' (TUBING) ×1
TUBING UROLOGY SET (TUBING) ×3 IMPLANT

## 2019-11-19 NOTE — Anesthesia Postprocedure Evaluation (Signed)
Anesthesia Post Note  Patient: Jason Day  Procedure(s) Performed: CYSTOSCOPY TRANSURETHRAL RESECTION OF THE PROSTATE (TURP) (N/A )     Patient location during evaluation: PACU Anesthesia Type: General Level of consciousness: sedated Pain management: pain level controlled Vital Signs Assessment: post-procedure vital signs reviewed and stable Respiratory status: spontaneous breathing and respiratory function stable Cardiovascular status: stable Postop Assessment: no apparent nausea or vomiting Anesthetic complications: no   No complications documented.  Last Vitals:  Vitals:   11/19/19 1100 11/19/19 1133  BP: 113/62 (!) 111/55  Pulse: 60 62  Resp: 16 16  Temp: 36.4 C 36.6 C  SpO2: 97% 100%    Last Pain:  Vitals:   11/19/19 1133  TempSrc: Oral  PainSc:                  Merlinda Frederick

## 2019-11-19 NOTE — Transfer of Care (Signed)
Immediate Anesthesia Transfer of Care Note  Patient: Jason Day  Procedure(s) Performed: CYSTOSCOPY TRANSURETHRAL RESECTION OF THE PROSTATE (TURP) (N/A )  Patient Location: PACU  Anesthesia Type:General  Level of Consciousness: awake, alert , oriented and patient cooperative  Airway & Oxygen Therapy: Patient Spontanous Breathing and Patient connected to face mask oxygen  Post-op Assessment: Report given to RN, Post -op Vital signs reviewed and stable and Patient moving all extremities  Post vital signs: Reviewed and stable  Last Vitals:  Vitals Value Taken Time  BP    Temp    Pulse 72 11/19/19 1026  Resp 15 11/19/19 1026  SpO2 100 % 11/19/19 1026  Vitals shown include unvalidated device data.  Last Pain:  Vitals:   11/19/19 0829  TempSrc:   PainSc: 0-No pain         Complications: No complications documented.

## 2019-11-19 NOTE — Interval H&P Note (Signed)
History and Physical Interval Note:  11/19/2019 8:12 AM  Jason Day  has presented today for surgery, with the diagnosis of [place].  The various methods of treatment have been discussed with the patient and family. After consideration of risks, benefits and other options for treatment, the patient has consented to  Procedure(s): [place] (TURP) (N/A) as a surgical intervention.  The patient's history has been reviewed, patient examined, no change in status, stable for surgery.  I have reviewed the patient's chart and labs.  Questions were answered to the patient's satisfaction.     [name]

## 2019-11-19 NOTE — Anesthesia Preprocedure Evaluation (Addendum)
Anesthesia Evaluation  Patient identified by MRN, date of birth, ID band Patient awake    Reviewed: Allergy & Precautions, H&P , NPO status , Patient's Chart, lab work & pertinent test results  Airway Mallampati: I  TM Distance: >3 FB Neck ROM: Full    Dental no notable dental hx. (+) Teeth Intact, Dental Advisory Given   Pulmonary neg pulmonary ROS, shortness of breath, former smoker,    Pulmonary exam normal breath sounds clear to auscultation       Cardiovascular Exercise Tolerance: Good + angina + CAD, + Past MI and +CHF  + dysrhythmias (on warfarin - stopped 5 days ago) Atrial Fibrillation  Rhythm:Irregular Rate:Normal  EKG reviewed.  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 This appears to be smaller as compared to prior echo. Left ventricular ejection fraction, by estimation, is 30 to  35%. The left ventricle has moderately decreased function. The left ventricle demonstrates regional wall motion abnormalities. Definity contrast agent was given IV to delineate the left ventricular endocardial borders. The left ventricular internal  cavity size was moderately dilated. There is moderate asymmetric left ventricular hypertrophy of the basal-septal segment. Indeterminate diastolic filling due to E-A fusion.   Neuro/Psych negative neurological ROS  negative psych ROS   GI/Hepatic negative GI ROS, Neg liver ROS,   Endo/Other  negative endocrine ROS  Renal/GU negative Renal ROS  negative genitourinary   Musculoskeletal negative musculoskeletal ROS (+)   Abdominal   Peds negative pediatric ROS (+)  Hematology negative hematology ROS (+)   Anesthesia Other Findings   Reproductive/Obstetrics negative OB ROS                            Anesthesia Physical  Anesthesia Plan  ASA:  IV  Anesthesia Plan: General   Post-op Pain Management:    Induction: Intravenous  PONV Risk Score and Plan: 2 and Dexamethasone, Ondansetron and Treatment may vary due to age or medical condition  Airway Management Planned: LMA  Additional Equipment:   Intra-op Plan:   Post-operative Plan:   Informed Consent: I have reviewed the patients History and Physical, chart, labs and discussed the procedure including the risks, benefits and alternatives for the proposed anesthesia with the patient or authorized representative who has indicated his/her understanding and acceptance.       Plan Discussed with: Anesthesiologist and CRNA  Anesthesia Plan Comments:         Anesthesia Quick Evaluation

## 2019-11-19 NOTE — Progress Notes (Signed)
Patient ID: Jason Day, male   DOB: 06-03-1940, 79 y.o.   MRN: [id number]  He is doing well postop and the urine is clear.  BP (!) 111/55 (BP Location: Right Arm)   Pulse 62   Temp 97.8 F (36.6 C) (Oral)   Resp 16   Ht 6' (1.829 m)   Wt 77.1 kg   SpO2 100%   BMI 23.06 kg/m   I will order his foley out at 5am and he will be able to go home when voidng.

## 2019-11-19 NOTE — Op Note (Signed)
Preoperative diagnosis: 1. Bladder outlet obstruction secondary to BPH  Postoperative diagnosis:  1. Bladder outlet obstruction secondary to BPH  Procedure:  1. Cystoscopy 2. Transurethral resection of the prostate  Surgeon: [name]. M.D.  Anesthesia: general  Complications: None  EBL: 25ml  Specimens: 1. Prostate chips  Disposition of specimens: Pathology  Indication: Jason Day is a patient with bladder outlet obstruction and retention secondary to benign prostatic hyperplasia. After reviewing the management options for treatment, he elected to proceed with the above surgical procedure(s). We have discussed the potential benefits and risks of the procedure, side effects of the proposed treatment, the likelihood of the patient achieving the goals of the procedure, and any potential problems that might occur during the procedure or recuperation. Informed consent has been obtained.  Description of procedure:  The patient was taken to the operating room and general  anesthesia was induced.  The patient was placed in the dorsal lithotomy position, prepped and draped in the usual sterile fashion, and preoperative antibiotics were administered. A preoperative time-out was performed.   Cystourethroscopy was performed.  The patient's urethra was examined and was normal. The prostatic urethra is 3-4cm with a small middle lobe.  The bladder was then systematically examined in its entirety. There was mild trabeculation with no evidence of any bladder tumors, stones, or other mucosal pathology other than erythema from the catheter.  The ureteral orifices were identified so as to be avoided during the procedure.  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 Care was taken not to resect distal to the verumontanum.  At the completion of the procedure the bladder was evacuated free of chips and hemostasis was insured.  Final inspection revealed intact ureteral orifices, a widely patent TUR channel and an intact external sphincter.   Hemostasis was then achieved with the cautery and the bladder was emptied and reinspected with no significant bleeding noted at the end of the procedure.  After removal of the scope pressure on the bladder produced stream  A 7fr 3 way catheter was then placed into the bladder and placed on continuous bladder irrigation.  The patient appeared to tolerate the procedure well and without complications.  The patient was able to be awakened and transferred to the recovery unit in satisfactory condition.

## 2019-11-19 NOTE — Anesthesia Procedure Notes (Signed)
Procedure Name: LMA Insertion Date/Time: 11/19/2019 9:10 AM Performed by: Victoriano Lain, CRNA Pre-anesthesia Checklist: Patient identified, Emergency Drugs available, Suction available, Patient being monitored and Timeout performed Patient Re-evaluated:Patient Re-evaluated prior to induction Oxygen Delivery Method: Circle system utilized Preoxygenation: Pre-oxygenation with 100% oxygen Induction Type: IV induction LMA: LMA with gastric port inserted Number of attempts: 1 Placement Confirmation: positive ETCO2 and breath sounds checked- equal and bilateral Tube secured with: Tape Dental Injury: Teeth and Oropharynx as per pre-operative assessment

## 2019-11-19 NOTE — Plan of Care (Signed)

## 2019-11-19 NOTE — Progress Notes (Signed)
Patient tolerated clear liquid diet.  Advanced to full liquid diet per order.

## 2019-11-19 NOTE — Discharge Instructions (Signed)
Transurethral Resection of the Prostate, Care After This sheet gives you information about how to care for yourself after your procedure. Your health care provider may also give you more specific instructions. If you have problems or questions, contact your health care provider. What can I expect after the procedure? After the procedure, it is common to have:  Mild pain in your lower abdomen.  Soreness or mild discomfort in your penis from having the catheter inserted during the procedure.  A feeling of urgency when you need to urinate.  A small amount of blood in your urine. You may notice some small blood clots in your urine. These are normal. Follow these instructions at home: Medicines  Take over-the-counter and prescription medicines only as told by your health care provider.  If you were prescribed an antibiotic medicine, take it as told by your health care provider. Do not stop taking the antibiotic even if you start to feel better.  Ask your health care provider if the medicine prescribed to you: ? Requires you to avoid driving or using heavy machinery. ? Can cause constipation. You may need to take actions to prevent or treat constipation, such as:  Take over-the-counter or prescription medicines.  Eat foods that are high in fiber, such as fresh fruits and vegetables, whole grains, and beans.  Limit foods that are high in fat and processed sugars, such as fried or sweet foods.  Do not drive for 24 hours if you were given a sedative during your procedure. Activity   Return to your normal activities as told by your health care provider. Ask your health care provider what activities are safe for you.  Do not lift anything that is heavier than 10 lb (4.5 kg), or the limit that you are told, for 3 weeks after the procedure or until your health care provider says that it is safe.  Avoid intense physical activity for as long as told by your health care provider.  Avoid  sitting for a long time without moving. Get up and move around one or more times every few hours. This helps to prevent blood clots. You may increase your physical activity gradually as you start to feel better. Lifestyle  Do not drink alcohol for as long as told by your health care provider. This is especially important if you are taking prescription pain medicines.  Do not engage in sexual activity until your health care provider says that you can do this. General instructions   Do not take baths, swim, or use a hot tub until your health care provider approves.  Drink enough fluid to keep your urine pale yellow.  Urinate as soon as you feel the need to. Do not try to hold your urine for long periods of time.  If your health care provider approves, you may take a stool softener for 2-3 weeks to prevent you from straining to have a bowel movement.  Wear compression stockings as told by your health care provider. These stockings help to prevent blood clots and reduce swelling in your legs.  Keep all follow-up visits as told by your health care provider. This is important. Contact a health care provider if you have:  Difficulty urinating.  A fever.  Pain that gets worse or does not improve with medicine.  Blood in your urine that does not go away after 1 week of resting and drinking more fluids.  Swelling in your penis or testicles. Get help right away if:  You are unable   to urinate.  You are having more blood clots in your urine instead of fewer.  You have: ? Large blood clots. ? A lot of blood in your urine. ? Pain in your back or lower abdomen. ? Pain or swelling in your legs. ? Chills and you are shaking. ? Difficulty breathing or shortness of breath. Summary  After the procedure, it is common to have a small amount of blood in your urine.  Avoid heavy lifting and intense physical activity for as long as told by your health care provider.  Urinate as soon as you  feel the need to. Do not try to hold your urine for long periods of time.  Keep all follow-up visits as told by your health care provider. This is important.  You may resume the warfarin in 1 week if you are not having bloody urine.  This information is not intended to replace advice given to you by your health care provider. Make sure you discuss any questions you have with your health care provider. Document Revised: 05/28/2018 Document Reviewed: 11/06/2017 Elsevier Patient Education  [place].

## 2019-11-20 ENCOUNTER — Encounter (HOSPITAL_COMMUNITY): Payer: Self-pay | Admitting: Urology

## 2019-11-20 DIAGNOSIS — I4891 Unspecified atrial fibrillation: Secondary | ICD-10-CM | POA: Diagnosis not present

## 2019-11-20 DIAGNOSIS — I5089 Other heart failure: Secondary | ICD-10-CM | POA: Diagnosis not present

## 2019-11-20 DIAGNOSIS — I25119 Atherosclerotic heart disease of native coronary artery with unspecified angina pectoris: Secondary | ICD-10-CM | POA: Diagnosis not present

## 2019-11-20 DIAGNOSIS — N401 Enlarged prostate with lower urinary tract symptoms: Secondary | ICD-10-CM | POA: Diagnosis not present

## 2019-11-20 DIAGNOSIS — R330 Drug induced retention of urine: Secondary | ICD-10-CM | POA: Diagnosis not present

## 2019-11-20 DIAGNOSIS — N320 Bladder-neck obstruction: Secondary | ICD-10-CM | POA: Diagnosis not present

## 2019-11-20 LAB — SURGICAL PATHOLOGY

## 2019-11-20 NOTE — Plan of Care (Signed)

## 2019-11-20 NOTE — Progress Notes (Signed)
Patient given discharge, follow up, and medication instructions, verbalized understanding, IV removed, personal belongings with patient, family to transport home  

## 2019-11-22 ENCOUNTER — Encounter (HOSPITAL_COMMUNITY): Payer: Self-pay | Admitting: Emergency Medicine

## 2019-11-22 ENCOUNTER — Emergency Department (HOSPITAL_COMMUNITY)
Admission: EM | Admit: 2019-11-22 | Discharge: 2019-11-22 | Disposition: A | Discharge status: Home / Self Care | Payer: Medicare Other | Attending: Emergency Medicine | Admitting: Emergency Medicine

## 2019-11-22 ENCOUNTER — Other Ambulatory Visit: Payer: Self-pay

## 2019-11-22 DIAGNOSIS — Z96612 Presence of left artificial shoulder joint: Secondary | ICD-10-CM | POA: Diagnosis not present

## 2019-11-22 DIAGNOSIS — I25810 Atherosclerosis of coronary artery bypass graft(s) without angina pectoris: Secondary | ICD-10-CM | POA: Diagnosis not present

## 2019-11-22 DIAGNOSIS — Z87891 Personal history of nicotine dependence: Secondary | ICD-10-CM | POA: Insufficient documentation

## 2019-11-22 DIAGNOSIS — I5042 Chronic combined systolic (congestive) and diastolic (congestive) heart failure: Secondary | ICD-10-CM | POA: Diagnosis not present

## 2019-11-22 DIAGNOSIS — R339 Retention of urine, unspecified: Secondary | ICD-10-CM

## 2019-11-22 LAB — URINALYSIS, ROUTINE W REFLEX MICROSCOPIC
Bacteria, UA: NONE SEEN
Bilirubin Urine: NEGATIVE
Glucose, UA: NEGATIVE mg/dL
Ketones, ur: NEGATIVE mg/dL
Nitrite: NEGATIVE
Protein, ur: 100 mg/dL — AB
RBC / HPF: >50 RBC/hpf — ABNORMAL HIGH (ref 0–5)
Specific Gravity, Urine: 1.009 (ref 1.005–1.030)
pH: 6 (ref 5.0–8.0)

## 2019-11-22 MED ORDER — HYDROCODONE-ACETAMINOPHEN 5-325 MG PO TABS
2.0000 | ORAL_TABLET | Freq: Once | ORAL | Status: AC
  Administered 2019-11-22: 2 via ORAL
  Filled 2019-11-22: qty 2

## 2019-11-22 MED ORDER — CIPROFLOXACIN HCL 250 MG PO TABS: 250.0000 mg | ORAL_TABLET | Freq: Two times a day (BID) | ORAL | 0 refills | Status: DC

## 2019-11-22 NOTE — Discharge Instructions (Addendum)
Continue Cipro for 3 more days.  Follow-up with Dr. Jeffie Pollock in 2 to 3 days, and return to the ER if you experience any new and/or concerning symptoms.

## 2019-11-22 NOTE — ED Triage Notes (Signed)
Pt had catheter removed Thursday. States that he has been "dribbling" since. Bladder scan over 87mL.

## 2019-11-22 NOTE — ED Provider Notes (Signed)
Jason Day   CSN: [id number] Arrival date & time: 11/22/19  0204     History Chief Complaint  Patient presents with  . Urinary Retention    Jason Day is a 79 y.o. male.  Patient is a 79 year old male with past medical history of ischemic cardiomyopathy, prior MI, atrial fibrillation, and recently underwent cystoscopy for treatment of urinary outlet obstruction related to BPH.  Patient presents today with an inability to void.  Starting earlier this evening, the patient was unable to empty his bladder.  This became to the point where he was extremely uncomfortable and presents here.  He denies any fevers or chills.  He denies having passed any blood.  The history is provided by the patient.       Past Medical History:  Diagnosis Date  . Bleeding hemorrhoids   . Chronic combined systolic and diastolic heart failure ([place])   . Chronic sinus complaints    overuses afrin  . Coronary atherosclerosis of native coronary artery   . Dyspnea   . Hyperlipidemia   . Ischemic cardiomyopathy   . Left atrial enlargement   . Moderate mitral regurgitation   . Myocardial infarction ([place]) 09/2015  . Persistent atrial fibrillation ([place])   . RBBB    A- Fib    Patient Active Problem List   Diagnosis Date Noted  . 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  . Mitral regurgitation 12/25/2018  . S/P reverse total shoulder arthroplasty, 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  . Acute infectious diarrhea 09/19/2017  . Chest pain 08/28/2017  . Change in behavior 08/28/2017  . Irritable bowel syndrome with both constipation and diarrhea 06/07/2017  . 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 Elevated PSA 04/23/2017  . Elevated bilirubin 04/22/2017  . Wellness examination 04/22/2017  . Internal and external bleeding hemorrhoids 04/22/2017  . Hematochezia 04/22/2017  . Alternating constipation and diarrhea 04/22/2017  . Atrial fibrillation ([place])   . Chronic combined systolic  and diastolic heart failure ([place])   . Chronic sinus complaints   . Hyperlipidemia   . Acute combined systolic and diastolic congestive heart failure ([place]) 10/16/2016  . 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 BACK SURGERY  ~2014   disc repair  . CARDIOVERSION N/A 10/24/2018   Procedure: CARDIOVERSION;  Surgeon: Jerline Pain, MD;  Location: [facility] ENDOSCOPY;  Service: Cardiovascular;  Laterality: N/A;  . CHOLECYSTECTOMY    . COLONOSCOPY  04/06/2014   Hemorrhoids and diverticulosis performed in [place]  . COLONOSCOPY WITH PROPOFOL N/A 08/04/2019   Procedure: COLONOSCOPY WITH PROPOFOL;  Surgeon: Wonda Horner, MD;  Location: [facility] ENDOSCOPY;  Service: Endoscopy;  Laterality: N/A;  . CORONARY ARTERY BYPASS GRAFT N/A 10/18/2016   Procedure: CORONARY ARTERY BYPASS GRAFTING (CABG) x five , using left internal mammary artery and right leg greater saphenous vein harvested endoscopically;  Surgeon: Gaye Pollack, MD;  Location: [place] OR;  Service: Open Heart Surgery;  Laterality: N/A;  . ESOPHAGOGASTRODUODENOSCOPY (EGD) WITH PROPOFOL N/A 08/04/2019   Procedure: ESOPHAGOGASTRODUODENOSCOPY (EGD) WITH PROPOFOL;  Surgeon: Wonda Horner, MD;  Location: [facility] ENDOSCOPY;  Service: Endoscopy;  Laterality: N/A;  . FOOT SURGERY    . HEMORRHOID BANDING    . HEMORRHOID SURGERY    . REVERSE SHOULDER ARTHROPLASTY Left 08/21/2018   Procedure: REVERSE SHOULDER ARTHROPLASTY;  Surgeon: Justice Britain, MD;  Location: [facility] ORS;  Service: Orthopedics;  Laterality: Left;  .  RIGHT/LEFT HEART CATH AND CORONARY ANGIOGRAPHY N/A 10/16/2016   Procedure: RIGHT/LEFT HEART CATH AND CORONARY ANGIOGRAPHY;  Surgeon: Belva Crome, MD;  Location: [place] CV LAB;  Service: Cardiovascular;  Laterality: N/A;  . stents in liver    . TEE WITHOUT CARDIOVERSION N/A 10/18/2016   Procedure: TRANSESOPHAGEAL ECHOCARDIOGRAM (TEE);  Surgeon:  Gaye Pollack, MD;  Location: [place];  Service: Open Heart Surgery;  Laterality: N/A;  . TRANSURETHRAL RESECTION OF PROSTATE N/A 11/19/2019   Procedure: CYSTOSCOPY TRANSURETHRAL RESECTION OF THE PROSTATE (TURP);  Surgeon: [name], MD;  Location: [facility] ORS;  Service: Urology;  Laterality: N/A;       Family History  Problem Relation Age of Onset  . COPD Father   . Emphysema Father   . Healthy Brother   . Diabetes Neg Hx   . Cancer Neg Hx   . Stomach cancer Neg Hx   . Colon cancer Neg Hx     Social History   Tobacco Use  . Smoking status: Former Smoker    Packs/day: 0.50    Types: Cigarettes    Quit date: 1970    Years since quitting: 51.7  . Smokeless tobacco: Never Used  . Tobacco comment: Smoked on and off  Vaping Use  . Vaping Use: Never used  Substance Use Topics  . Alcohol use: No  . Drug use: No    Home Medications Prior to Admission medications   Medication Sig Start Date End Date Taking? Authorizing Provider  atorvastatin (LIPITOR) 20 MG tablet Take 0.5 tablets (10 mg total) by mouth daily. 07/09/19   Josue Hector, MD  ciprofloxacin (CIPRO) 250 MG tablet Take 1 tablet (250 mg total) by mouth 2 (two) times daily. 11/19/19   [name], MD  digoxin (LANOXIN) 0.125 MG tablet Take 0.5 tablets (0.0625 mg total) by mouth daily. 07/13/19   Josue Hector, MD  finasteride (PROSCAR) 5 MG tablet Take 5 mg by mouth daily.    [provider]  glycopyrrolate (ROBINUL) 2 MG tablet Take 2 mg by mouth 2 (two) times daily. 10/24/19   [provider]  metoprolol succinate (TOPROL-XL) 25 MG 24 hr tablet TAKE 1/2 TABLET BY MOUTH EVERY DAY Patient taking differently: Take 12.5 mg by mouth daily.  07/09/19   Josue Hector, MD  sacubitril-valsartan (ENTRESTO) 49-51 MG Take 1 tablet by mouth 2 (two) times daily. 07/29/18   Josue Hector, MD  spironolactone (ALDACTONE) 25 MG tablet TAKE 1 TABLET BY MOUTH EVERY DAY Patient taking differently: Take 25 mg by mouth daily.   10/08/19   Josue Hector, MD  warfarin (COUMADIN) 5 MG tablet Take 1 tablet (5 mg total) by mouth daily. Start on 09/12/19 Patient taking differently: Take 2.5-5 mg by mouth daily. Take 2.5 mg on Thursday and Sunday All the other days take 5 mg 08/27/19   Josue Hector, MD    Allergies    Patient has no known allergies.  Review of Systems   Review of Systems  All other systems reviewed and are negative.   Physical Exam Updated Vital Signs BP 135/76 (BP Location: Left Arm)   Pulse 75   Temp 98.4 F (36.9 C) (Oral)   Resp 16   Ht 6' (1.829 m)   Wt 77.1 kg   SpO2 100%   BMI 23.06 kg/m   Physical Exam Vitals and nursing Day reviewed.  Constitutional:      General: He is in acute distress.  Appearance: Normal appearance. He is not ill-appearing.  HENT:     Head: Normocephalic and atraumatic.  Pulmonary:     Effort: Pulmonary effort is normal.  Abdominal:     General: Abdomen is flat. There is no distension.     Tenderness: There is no abdominal tenderness.  Musculoskeletal:        General: Normal range of motion.  Skin:    General: Skin is warm and dry.  Neurological:     Mental Status: He is alert.     ED Results / Procedures / Treatments   Labs (all labs ordered are listed, but only abnormal results are displayed) Labs Reviewed  URINALYSIS, ROUTINE W REFLEX MICROSCOPIC    EKG None  Radiology No results found.  Procedures Procedures (including critical care time)  Medications Ordered in ED Medications  HYDROcodone-acetaminophen (NORCO/VICODIN) 5-325 MG per tablet 2 tablet (has no administration in time range)    ED Course  I have reviewed the triage vital signs and the nursing notes.  Pertinent labs & imaging results that were available during my care of the patient were reviewed by me and considered in my medical decision making (see chart for details).    MDM Rules/Calculators/A&P  Patient presenting with urinary retention 1 day after  cystoscopy for BPH.  Foley catheter placed and nearly 1000 cc of urine was obtained.  Urine is basically clear and culture obtained.  Patient has been on Cipro since this procedure and just recently took his last dose.  This will be extended for several more days.  To follow-up with urology in the next 2 days.  Foley catheter left in place.  Final Clinical Impression(s) / ED Diagnoses Final diagnoses:  None    Rx / DC Orders ED Discharge Orders    None       Veryl Speak, MD 11/22/19 [phone]

## 2019-11-22 NOTE — ED Notes (Signed)
Bladder scan results: 842 mL.

## 2019-11-24 ENCOUNTER — Other Ambulatory Visit: Payer: Self-pay | Admitting: Cardiovascular Disease

## 2019-11-25 LAB — URINE CULTURE: Culture: 10000 — AB

## 2019-11-26 ENCOUNTER — Telehealth: Payer: Self-pay

## 2019-11-26 NOTE — Telephone Encounter (Signed)
PT to follow up with Uro 2 days post ED visit [facility] on 11/22/19, where foley was placed post surgical procedure.

## 2019-11-27 ENCOUNTER — Other Ambulatory Visit: Payer: Self-pay | Admitting: *Deleted

## 2019-11-27 MED ORDER — ENTRESTO 49-51 MG PO TABS: 1.0000 | ORAL_TABLET | Freq: Two times a day (BID) | ORAL | 3 refills | Status: DC

## 2019-12-03 NOTE — Discharge Summary (Signed)
Patient ID: Jason Day MRN: [id number] DOB/AGE: Mar 24, 1940 79 y.o.  Admit date: 11/19/2019 Discharge date: 10.1.2021  Primary Care Physician:  Patient, No Pcp Per  Discharge Diagnoses:  N40.1 Present on Admission: . BPH with urinary obstruction   Discharge Medications: Allergies as of 11/20/2019   No Known Allergies     Medication List    TAKE these medications   atorvastatin 20 MG tablet Commonly known as: LIPITOR Take 0.5 tablets (10 mg total) by mouth daily. Notes to patient: 11/21/19   digoxin 0.125 MG tablet Commonly known as: LANOXIN Take 0.5 tablets (0.0625 mg total) by mouth daily. Notes to patient: 11/20/19   finasteride 5 MG tablet Commonly known as: PROSCAR Take 5 mg by mouth daily. Notes to patient: 11/21/19   metoprolol succinate 25 MG 24 hr tablet Commonly known as: TOPROL-XL TAKE 1/2 TABLET BY MOUTH EVERY Day Notes to patient: 11/21/19   spironolactone 25 MG tablet Commonly known as: ALDACTONE TAKE 1 TABLET BY MOUTH EVERY Day Notes to patient: 11/21/19        Brief H and P: For complete details please refer to admission H and P, but in brief pt admitted for TURP for mgmt of BPH w/ obstruction  Hospital Course: Pt underwent TURP and had uncomplicated postop course. Active Problems:   BPH with urinary obstruction   Day of Discharge BP (!) 103/57 (BP Location: Left Arm)   Pulse (!) 45   Temp 98.2 F (36.8 C) (Oral)   Resp 18   Ht 6' (1.829 m)   Wt 77.1 kg   SpO2 99%   BMI 23.06 kg/m   No results found for this or any previous visit (from the past 24 hour(s)).  Physical Exam: General: Alert and awake oriented x3 not in any acute distress. HEENT: anicteric sclera, pupils reactive to light and accommodation CVS: S1-S2 clear no murmur rubs or gallops Chest: clear to auscultation bilaterally, no wheezing rales or rhonchi Abdomen: soft nontender, nondistended, normal bowel sounds, no organomegaly Extremities: no cyanosis, clubbing or edema  noted bilaterally Neuro: Cranial nerves II-XII intact, no focal neurological deficits  Disposition:  Home  Diet:  Regular  Activity:  Discussed w/ pt   TESTS THAT NEED FOLLOW-UP   Path review  DISCHARGE FOLLOW-UP   Follow-up Information    Jason Kays, NP On 11/24/2019.   Specialty: Nurse Practitioner Why: [id number] information: [place] [address] [place] [address] [phone]               Time spent on Discharge:   10 mins  Signed: [name] 12/03/2019, 6:28 AM

## 2019-12-08 ENCOUNTER — Ambulatory Visit: Payer: Medicare Other | Admitting: Cardiovascular Disease

## 2019-12-09 ENCOUNTER — Other Ambulatory Visit: Payer: Self-pay

## 2019-12-09 ENCOUNTER — Ambulatory Visit (INDEPENDENT_AMBULATORY_CARE_PROVIDER_SITE_OTHER): Payer: Medicare Other | Admitting: *Deleted

## 2019-12-09 DIAGNOSIS — Z951 Presence of aortocoronary bypass graft: Secondary | ICD-10-CM | POA: Diagnosis not present

## 2019-12-09 DIAGNOSIS — Z5181 Encounter for therapeutic drug level monitoring: Secondary | ICD-10-CM

## 2019-12-09 LAB — POCT INR: INR: 1.9 — AB (ref 2.0–3.0)

## 2019-12-09 NOTE — Patient Instructions (Signed)
Description   Take 1.5 tablets today and then continue taking 1 tablet daily except for 1/2 tablet on Sundays and Thursdays. Recheck INR 4 weeks per pt request. Call coumadin clinic for any changes in medications or upcoming procedures. [phone]

## 2019-12-10 ENCOUNTER — Telehealth: Payer: Self-pay | Admitting: *Deleted

## 2019-12-10 DIAGNOSIS — K639 Disease of intestine, unspecified: Secondary | ICD-10-CM | POA: Diagnosis not present

## 2019-12-10 DIAGNOSIS — R140 Abdominal distension (gaseous): Secondary | ICD-10-CM | POA: Diagnosis not present

## 2019-12-10 NOTE — Telephone Encounter (Signed)
Pt called and stated that he would like for me to take Cipro off his med list because he is not taking that medication. Updated medication list showing he is not taking that medication and marked it for removal.

## 2020-01-06 ENCOUNTER — Ambulatory Visit (INDEPENDENT_AMBULATORY_CARE_PROVIDER_SITE_OTHER): Payer: Medicare Other

## 2020-01-06 ENCOUNTER — Other Ambulatory Visit: Payer: Self-pay

## 2020-01-06 DIAGNOSIS — Z951 Presence of aortocoronary bypass graft: Secondary | ICD-10-CM

## 2020-01-06 DIAGNOSIS — Z5181 Encounter for therapeutic drug level monitoring: Secondary | ICD-10-CM

## 2020-01-06 LAB — POCT INR: INR: 3.1 — AB (ref 2.0–3.0)

## 2020-01-06 NOTE — Patient Instructions (Signed)
Description   Since you have been taking 1 tablet daily, start taking 1 tablet daily except for 1/2 tablet only on Sundays. Recheck INR 4 weeks per pt request. Call coumadin clinic for any changes in medications or upcoming procedures. [phone]

## 2020-01-26 DIAGNOSIS — R197 Diarrhea, unspecified: Secondary | ICD-10-CM | POA: Diagnosis not present

## 2020-02-10 ENCOUNTER — Other Ambulatory Visit: Payer: Self-pay

## 2020-02-10 ENCOUNTER — Ambulatory Visit (INDEPENDENT_AMBULATORY_CARE_PROVIDER_SITE_OTHER): Payer: Medicare Other | Admitting: *Deleted

## 2020-02-10 DIAGNOSIS — Z5181 Encounter for therapeutic drug level monitoring: Secondary | ICD-10-CM

## 2020-02-10 DIAGNOSIS — Z951 Presence of aortocoronary bypass graft: Secondary | ICD-10-CM | POA: Diagnosis not present

## 2020-02-10 DIAGNOSIS — R330 Drug induced retention of urine: Secondary | ICD-10-CM | POA: Diagnosis not present

## 2020-02-10 LAB — POCT INR: INR: 2 (ref 2.0–3.0)

## 2020-02-10 NOTE — Patient Instructions (Signed)
Description    Continue taking 1 tablet daily except for 1/2 tablet only on Sundays. Recheck INR 4 weeks per pt request. Call coumadin clinic for any changes in medications or upcoming procedures. [phone]

## 2020-02-24 ENCOUNTER — Other Ambulatory Visit: Payer: Self-pay | Admitting: Cardiovascular Disease

## 2020-03-10 ENCOUNTER — Other Ambulatory Visit: Payer: Self-pay

## 2020-03-10 ENCOUNTER — Ambulatory Visit (INDEPENDENT_AMBULATORY_CARE_PROVIDER_SITE_OTHER): Payer: Medicare Other | Admitting: *Deleted

## 2020-03-10 DIAGNOSIS — Z951 Presence of aortocoronary bypass graft: Secondary | ICD-10-CM | POA: Diagnosis not present

## 2020-03-10 DIAGNOSIS — Z5181 Encounter for therapeutic drug level monitoring: Secondary | ICD-10-CM | POA: Diagnosis not present

## 2020-03-10 LAB — POCT INR: INR: 2.9 (ref 2.0–3.0)

## 2020-03-10 NOTE — Patient Instructions (Signed)
Description    Continue taking 1 tablet daily except for 1/2 tablet only on Sundays. Recheck INR 5 weeks. Call coumadin clinic for any changes in medications or upcoming procedures. [phone]

## 2020-03-18 ENCOUNTER — Telehealth: Payer: Self-pay | Admitting: Cardiovascular Disease

## 2020-03-18 NOTE — Telephone Encounter (Signed)
Dr. Nishan pt. 

## 2020-03-18 NOTE — Telephone Encounter (Signed)
Patient would like for Dr. Marlou Porch or nurse to call im back

## 2020-03-18 NOTE — Telephone Encounter (Signed)
Patient wanted a referral for his wife. Explained to patient that his wife is not our patient. Encouraged him to have his wife get a PCP and have PCP put in referral for wife. Patient agreed to plan.

## 2020-03-22 ENCOUNTER — Other Ambulatory Visit: Payer: Self-pay

## 2020-03-22 ENCOUNTER — Ambulatory Visit
Admission: RE | Admit: 2020-03-22 | Discharge: 2020-03-22 | Disposition: A | Discharge status: Home / Self Care | Payer: Medicare Other | Source: Ambulatory Visit | Attending: Gastroenterology | Admitting: Gastroenterology

## 2020-03-22 ENCOUNTER — Other Ambulatory Visit: Payer: Self-pay | Admitting: Gastroenterology

## 2020-03-22 DIAGNOSIS — R109 Unspecified abdominal pain: Secondary | ICD-10-CM

## 2020-03-22 DIAGNOSIS — R140 Abdominal distension (gaseous): Secondary | ICD-10-CM

## 2020-03-25 ENCOUNTER — Other Ambulatory Visit: Payer: Self-pay

## 2020-03-25 MED ORDER — SPIRONOLACTONE 25 MG PO TABS
25.0000 mg | ORAL_TABLET | Freq: Every day | ORAL | 2 refills | Status: DC
Start: 2020-03-25 — End: 2021-01-02

## 2020-03-25 NOTE — Telephone Encounter (Signed)
Pt's medication was sent to pt's pharmacy as requested. Confirmation received.  °

## 2020-03-30 ENCOUNTER — Other Ambulatory Visit: Payer: Self-pay | Admitting: Physician Assistant

## 2020-03-30 ENCOUNTER — Other Ambulatory Visit: Payer: Self-pay | Admitting: Cardiovascular Disease

## 2020-03-30 NOTE — Telephone Encounter (Signed)
Warfarin refill received and it was last filled on 02/24/2020 for a 90 day supply (enough until April 2022). Will call the pharmacy to ensure it was received.   Called the pharmacy and they states it was not received so gave a verbal for Warfarin 5mg , instructions given were to take 1/2 tablet to 1 tablet by mouth daily or as directed by Coumadin Clinic with 90 tabs 0 refills under his Cardiologist Dr Johnsie Cancel. She states they will process the refill. Will deny since gave a verbal order.

## 2020-04-27 ENCOUNTER — Ambulatory Visit: Payer: Medicare Other | Admitting: Cardiovascular Disease

## 2020-05-04 ENCOUNTER — Other Ambulatory Visit: Payer: Self-pay

## 2020-05-04 ENCOUNTER — Telehealth: Payer: Self-pay | Admitting: Licensed Clinical Social Worker

## 2020-05-04 MED ORDER — ENTRESTO 49-51 MG PO TABS: 1.0000 | ORAL_TABLET | Freq: Two times a day (BID) | ORAL | 1 refills | Status: DC

## 2020-05-04 NOTE — Telephone Encounter (Signed)
CSW received call from pt requesting help with his [employer] assistance.  Pt states he was getting assistance through [employer] but then states he took [employer] application to [address] to be submitted and he hasn't heard back and is now out of medication.  CSW able to confirm through [employer] portal that pt has active grant that is good until 02/25/21- still has $993 in this grant so informed pt he needed to get his medication at his normal pharmacy utilizing this grant.  CSW called [employer] [address] and [facility] confirmed [facility] and [employer] foundation grant information- confirmed grant working and pt could get medication for $0.  CSW informed of above and will pick up medication later today.  [name], LCSW Clinical Social Worker [facility] Desk#: [phone] Cell#: [phone]

## 2020-05-05 ENCOUNTER — Telehealth: Payer: Self-pay | Admitting: *Deleted

## 2020-05-05 NOTE — Telephone Encounter (Signed)
Pt is overdue to have INR checked. Called pt and LMOM.   Called pt's mobile number, his wife answered. Asked if I could speak to pt, wife stated that he was sleeping but she would check to see if he was awake. Pt stated that he would call back to the coumadin clinic to schedule appointment.

## 2020-05-13 ENCOUNTER — Ambulatory Visit: Payer: Medicare Other | Admitting: Cardiovascular Disease

## 2020-05-17 ENCOUNTER — Telehealth: Payer: Self-pay | Admitting: Cardiovascular Disease

## 2020-05-17 NOTE — Telephone Encounter (Signed)
Called patient wife to inform her that Jason Day is not in office today.  She told me that she is very concerned about husband.  She expressed that he is not eating, is argumentative, and hasn't had labs drawn for coumadin since around January.  She talked to me over 10 minutes regarding concrns about patient.  She would like for Pam to call her with suggestions and possibly have Dr. Johnsie Cancel call the patient.  I advised her to follow up with PCP.  She informed me that he does not have a PCP and she needs suggestions.  I advised her to google doctors and set up an appointment with a provider to get him seen if he is as bad as she states. She expressed that she doesn't want him to go to just any doctor because he has a history of been argumentative with doctors.  I then let her know that I would forward her message to [facility] and she would FU when she can.  She thanked me for listening to her concerns.

## 2020-05-17 NOTE — Telephone Encounter (Signed)
Patient's wife is asking for Pam to give her a call please, she is concern about the patient behavior. He hasn't gone to get is blood work done and he is losing weight. She says he doesn't remember things, she thinks he maybe going through depression. Wife is asking that you please give her a call [phone]

## 2020-05-17 NOTE — Telephone Encounter (Signed)
Not sure we can help with his behavioral issues. ? Go to urgent care or med center [facility] to be evaluated and possibly be started on med like Cymbalta. See if we can get him in for INR check this week

## 2020-05-18 NOTE — Telephone Encounter (Signed)
Called patient's wife back. Informed her of Dr. Johnsie Cancel recommendations. Patient's wife is going to try to get patient to go to urgent care and hopefully get a referral to a PCP.

## 2020-05-18 NOTE — Telephone Encounter (Signed)
Left a message for patient or wife to call the office.  To go over Dr. Johnsie Cancel recommendations.

## 2020-05-19 ENCOUNTER — Telehealth: Payer: Self-pay | Admitting: *Deleted

## 2020-05-19 NOTE — Telephone Encounter (Signed)
Pt is overdue to have INR checked. Called pt and LMOM to call coumadin clinic back to schedule an appointment.

## 2020-06-08 ENCOUNTER — Telehealth: Payer: Self-pay | Admitting: *Deleted

## 2020-06-08 NOTE — Telephone Encounter (Signed)
Pt is overdue to have INR check. Called pt and LMOM for pt to call back and schedule an appointment.

## 2020-06-16 ENCOUNTER — Other Ambulatory Visit: Payer: Self-pay | Admitting: Cardiovascular Disease

## 2020-06-17 ENCOUNTER — Other Ambulatory Visit: Payer: Self-pay | Admitting: Physician Assistant

## 2020-06-17 ENCOUNTER — Other Ambulatory Visit: Payer: Self-pay | Admitting: Cardiovascular Disease

## 2020-06-20 NOTE — Telephone Encounter (Addendum)
Pt needs an appt and 90 day is inappropriate for this pt (noncompliance with appts).  Will call to try to schedule.  At 903am, called pt to schedule an appt in the Anticoagulation Clinic; there was no answer so left a message for the pt to call back as he needs an appt since he is requesting a refill.

## 2020-06-21 NOTE — Telephone Encounter (Signed)
Called pt since he has a requested Warfarin refill and is overdue to see the Anticoagulation Clinic. There was no answer so left another message for the pt to call back regarding a refill request we have received and to schedule an appointment. I left the direct number to call back. Will await a call back since unable to process this refill since pt in overdue.

## 2020-06-23 NOTE — Telephone Encounter (Signed)
Called the pt again and left another message.

## 2020-06-28 ENCOUNTER — Telehealth: Payer: Self-pay | Admitting: *Deleted

## 2020-06-28 NOTE — Telephone Encounter (Addendum)
Per 06/17/20 refill note have been trying to reach the pt regarding an Anticoagulation/Coumadin Clinic appt. Warfarin/Coumadin must be monitored so unable to fill since pt is overdue.  Today, I called the pt without success. Will deny refill since pt has not returned any calls regarding an appt with the Anticoagulation Clinic and Warfarin refill.

## 2020-06-28 NOTE — Telephone Encounter (Signed)
Called pt without success. Will deny refill since pt has not returned any calls regarding an appt with the Anticoagulation Clinic and Warfarin refill.

## 2020-06-28 NOTE — Telephone Encounter (Signed)
Pt returned the call and states he could not come in today and is aware that he needs to have his blood checked. Advised that we cannot process his refill until he adheres to appt and he states he knows and it is his own fault that he is not coming in. Also, he states the Cardiologist has canceled all his appt with him and he will get things arranged to come in when he has a chance. He stated this clinic is on his list to call and he will call back tomorrow afternoon to make an appt. He is aware we can not send in Warfarin refill as he is not being monitored.

## 2020-07-06 ENCOUNTER — Telehealth: Payer: Self-pay | Admitting: *Deleted

## 2020-07-06 NOTE — Telephone Encounter (Signed)
Pt called the main number and set an appt for 07/27/20; which is not the recommended date. He states he has enough Warfarin to last until the appt.

## 2020-07-06 NOTE — Telephone Encounter (Signed)
Received a voicemail from the pt stating his name and date of birth. Called the pt back but had to leave a message to call back. Pt is overdue for Anticoagulation Appt. Also, left main number to call to set an appt. Will await a call back.

## 2020-07-26 ENCOUNTER — Other Ambulatory Visit: Payer: Self-pay | Admitting: Cardiovascular Disease

## 2020-07-26 ENCOUNTER — Telehealth: Payer: Self-pay | Admitting: Cardiovascular Disease

## 2020-07-26 MED ORDER — METOPROLOL SUCCINATE ER 25 MG PO TB24: 0.5000 | ORAL_TABLET | Freq: Every day | ORAL | 0 refills | Status: DC

## 2020-07-26 NOTE — Telephone Encounter (Signed)
New Message      Pt wants to know if he needs lab work before his appt on 09-06-20?

## 2020-07-26 NOTE — Telephone Encounter (Signed)
Outpatient Medication Detail   Disp Refills Start End   metoprolol succinate (TOPROL-XL) 25 MG 24 hr tablet 45 tablet 0 07/26/2020    Sig - Route: Take 0.5 tablets (12.5 mg total) by mouth daily. - Oral   Sent to pharmacy as: metoprolol succinate (TOPROL-XL) 25 MG 24 hr tablet   Notes to Pharmacy: PT MUST KEEP APPT IN July FOR FURTHER REFILLS.   E-Prescribing Status: Receipt confirmed by pharmacy (07/26/2020  9:29 AM EDT)     Ph[facility]acy  [id number] [place] [place] - [address]

## 2020-07-26 NOTE — Telephone Encounter (Signed)
. *  STAT* If patient is at the pharmacy, call can be transferred to refill team.   1. Which medications need to be refilled? (please list name of each medication and dose if known) metoprolol  2. Which pharmacy/location (including street and city if local pharmacy) is medication to be sent to? [facility] [address] [place],  3. Do they need a 30 day or 90 day supply? 90 days and refills

## 2020-07-26 NOTE — Telephone Encounter (Signed)
[facility]ill sent in [facility]rol[facility], [facility] pt request[facility] [address]  Pt has an appt coming up in July.

## 2020-07-27 ENCOUNTER — Other Ambulatory Visit: Payer: Self-pay

## 2020-07-27 ENCOUNTER — Ambulatory Visit (INDEPENDENT_AMBULATORY_CARE_PROVIDER_SITE_OTHER): Payer: HMO | Admitting: *Deleted

## 2020-07-27 DIAGNOSIS — Z5181 Encounter for therapeutic drug level monitoring: Secondary | ICD-10-CM

## 2020-07-27 DIAGNOSIS — Z951 Presence of aortocoronary bypass graft: Secondary | ICD-10-CM | POA: Diagnosis not present

## 2020-07-27 LAB — POCT INR: INR: 1.8 — AB (ref 2.0–3.0)

## 2020-07-27 NOTE — Patient Instructions (Addendum)
Description   Today take 1.5 tablets then continue taking 1 tablet daily except for 1/2 tablet only on Sundays. Advised pt to speak with Dr Johnsie Cancel about switching to back to [place]. Recheck INR 6 weeks per pt request with MD Appt. Call coumadin clinic for any changes in medications or upcoming procedures. [phone]

## 2020-08-15 ENCOUNTER — Telehealth: Payer: Self-pay | Admitting: Cardiovascular Disease

## 2020-08-15 ENCOUNTER — Other Ambulatory Visit (HOSPITAL_COMMUNITY): Payer: Self-pay

## 2020-08-15 NOTE — Telephone Encounter (Signed)
**Note De-Identified [name] Obfuscation** Per phone note in the pts chart from 3/16 I will forward this note to Tammy Sours, LCSW for advisement to the pt.

## 2020-08-15 NOTE — Telephone Encounter (Signed)
Patient called to say that he is out of sacubitril-valsartan (ENTRESTO) 49-51 MG and it was getting through a [employer]  where he doesn't have to pay out of practice. Is unable to get through the pharmacy because it is too expensive. Please advise.

## 2020-08-16 ENCOUNTER — Telehealth: Payer: Self-pay | Admitting: Licensed Clinical Social Worker

## 2020-08-16 NOTE — Telephone Encounter (Signed)
Pt was transferred to this writer, questions related to medication assistance. I introduced self and role to pt. Pt states that he had been working on assistance, called my colleague Eliezer Lofts who is out on maternity leave. He confirms that he had spoken with Kathlee Nations, pharmacy advocate. However he still has questions. Was able to three way call Kathlee Nations in. We were able to clarify with pt that he has [employer] PAP paperwork, not [employer] foundation- and that he needs to have Dr. Johnsie Cancel complete and submit on his behalf. Pt very anxious about getting this done as he is nearing being out of medications. I was able to get in contact with Jeani Hawking, RN, and [place], CMA, who are aware pt will bring paperwork by the office and will assist pt with having samples at the front desk. LCSW called pt back and shared this information. If he runs into any challenges then he has been encouraged to call me back directly.   Westley Hummer, MSW, [place]  [phone]

## 2020-08-16 NOTE — Telephone Encounter (Signed)
No problem ladies, anytime.

## 2020-08-17 NOTE — Telephone Encounter (Signed)
**Note De-Identified Jason Day Obfuscation** The pt left his completed [employer] pt asst application for Entresto at the office with documents. I have completed the provider page of the application and emailed all to Dr Kyla Balzarine nurse so she can obtain his signature, date it, and to fax all to [employer] at the fax number written on the cover letter included.

## 2020-08-17 NOTE — Telephone Encounter (Signed)
Pt's original paperwork was mail out today to the pt.

## 2020-08-17 NOTE — Telephone Encounter (Signed)
Left message on patient's voicemail about his paperwork has been faxed and Dr. Tamala Julian has signed his prescription, who is covering for Dr. Johnsie Cancel, since he is out at this time. Also, left message that his original paper work would be mailed back to him.

## 2020-08-17 NOTE — Telephone Encounter (Signed)
**Note De-Identified Ahliya Glatt Obfuscation** Dr Johnsie Cancel is out of the office until 7/8. Dr Tamala Julian has agreed to sign the application so I have completed another provider page of the application with Dr Darliss Ridgel info and emailed it to Dr Mariana Arn nurse so she can obtain Dr Thompson Caul signature and to fax all to [employer] pt asst foundation and to give original application with (Dr Darliss Ridgel signature) to Carter Kitten, [place] so she can mail all to the pt for his records as requested

## 2020-08-26 ENCOUNTER — Other Ambulatory Visit: Payer: Self-pay | Admitting: Cardiovascular Disease

## 2020-08-28 NOTE — Progress Notes (Signed)
Cardiology Office Note    Date:  09/06/2020   ID:  [name], DOB 07-Sep-1940, MRN [id number]  PCP:  Lind Covert, MD  Cardiologist:  Dr. Johnsie Cancel   Chief Complaint: Edema / CHF   History of Present Illness:   Jason Day is a 80 y.o. male CAD s/p CABG, ICM, PAF,  MR and HLD seen in f/u   He initially presented 09/2016 w/ acute pulmonary edema and diuresed. Echo showed reduced LVEF, down to 30%, leading to LHC, which showed severe multivessel CAD. 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 Post of afib >> on amiodarone and coumadin. He is claustrophobic and cannot have MRI for EF calculation  He has had recurrent afib and failed medical Rx with amiodarone LA 51 mm diameter on TTE Seen by [facility] 12/25/18 and ablation deferred plan for rate control and anticoagulation strategy Xarelto too expensive on coumadin now   Last echo 04/24/19  showed LVEF of 30-35%,mild / moderate MR Able to get Entresto through patient assistance   The patient had L rotator cuff arthropathy 08/21/18.   Currently his edema is from LE venous disease and not CHF  Had benign upper endoscopy and colonoscopy 08/04/19 Dr Penelope Coop  Also had some urinary retention requiring foley and cystoscopy  Wife notes some behavioral issues and very argumentative   Taking 40 mg of lasix as needed for edema Mostly in his LLE from reflux and chronic venous disease   Past Medical History:  Diagnosis Date   Bleeding hemorrhoids    Chronic combined systolic and diastolic heart failure (HCC)    Chronic sinus complaints    overuses afrin   Coronary atherosclerosis of native coronary artery    Dyspnea    Hyperlipidemia    Ischemic cardiomyopathy    Left atrial enlargement    Moderate mitral regurgitation    Myocardial infarction ([facility]) 09/2015   Persistent atrial fibrillation (HCC)    RBBB    A- Fib    Past Surgical History:  Procedure Laterality Date   BACK SURGERY  ~2014   disc repair   CARDIOVERSION N/A 10/24/2018   Procedure:  CARDIOVERSION;  Surgeon: Jerline Pain, MD;  Location: [facility];  Service: Cardiovascular;  Laterality: N/A;   CHOLECYSTECTOMY     COLONOSCOPY  04/06/2014   Hemorrhoids and diverticulosis performed in [place] N/A 08/04/2019   Procedure: COLONOSCOPY WITH PROPOFOL;  Surgeon: Wonda Horner, MD;  Location: [facility];  Service: Endoscopy;  Laterality: N/A;   CORONARY ARTERY BYPASS GRAFT N/A 10/18/2016   Procedure: CORONARY ARTERY BYPASS GRAFTING (CABG) x five , using left internal mammary artery and right leg greater saphenous vein harvested endoscopically;  Surgeon: Gaye Pollack, MD;  Location: [facility];  Service: Open Heart Surgery;  Laterality: N/A;   ESOPHAGOGASTRODUODENOSCOPY (EGD) WITH PROPOFOL N/A 08/04/2019   Procedure: ESOPHAGOGASTRODUODENOSCOPY (EGD) WITH PROPOFOL;  Surgeon: Wonda Horner, MD;  Location: [facility];  Service: Endoscopy;  Laterality: N/A;   FOOT SURGERY     HEMORRHOID BANDING     HEMORRHOID SURGERY     REVERSE SHOULDER ARTHROPLASTY Left 08/21/2018   Procedure: REVERSE SHOULDER ARTHROPLASTY;  Surgeon: Justice Britain, MD;  Location: [facility];  Service: Orthopedics;  Laterality: Left;   RIGHT/LEFT HEART CATH AND CORONARY ANGIOGRAPHY N/A 10/16/2016   Procedure: RIGHT/LEFT HEART CATH AND CORONARY ANGIOGRAPHY;  Surgeon: Belva Crome, MD;  Location: [facility];  Service: Cardiovascular;  Laterality: N/A;   stents in liver  TEE WITHOUT CARDIOVERSION N/A 10/18/2016   Procedure: TRANSESOPHAGEAL ECHOCARDIOGRAM (TEE);  Surgeon: Gaye Pollack, MD;  Location: [facility];  Service: Open Heart Surgery;  Laterality: N/A;   TRANSURETHRAL RESECTION OF PROSTATE N/A 11/19/2019   Procedure: CYSTOSCOPY TRANSURETHRAL RESECTION OF THE PROSTATE (TURP);  Surgeon: [name], MD;  Location: [facility];  Service: Urology;  Laterality: N/A;    Current Medications:  Prior to Admission medications   Medication Sig Start Date End Date Taking? Authorizing Provider   atorvastatin (LIPITOR) 20 MG tablet Take 0.5 tablets (10 mg total) by mouth daily. Patient taking differently: Take 10 mg by mouth every evening.  07/29/18  Yes Josue Hector, MD  digoxin (LANOXIN) 0.125 MG tablet Take 0.5 tablets (62.5 mcg total) by mouth daily. 06/26/18  Yes Josue Hector, MD  metoprolol succinate (TOPROL-XL) 25 MG 24 hr tablet Take 0.5 tablets (12.5 mg total) by mouth daily. Patient taking differently: Take 12.5 mg by mouth at bedtime.  06/26/18  Yes Josue Hector, MD  sacubitril-valsartan (ENTRESTO) 49-51 MG Take 1 tablet by mouth 2 (two) times daily. 07/29/18  Yes Josue Hector, MD  spironolactone (ALDACTONE) 25 MG tablet Take 0.5 tablets (12.5 mg total) by mouth daily. Patient taking differently: Take 12.5 mg by mouth every evening.  06/26/18  Yes Josue Hector, MD  warfarin (COUMADIN) 5 MG tablet Take as directed by [facility] Patient taking differently: Take 5 mg by mouth every evening. Take as directed by Coumadin Clinic 06/26/18  Yes Josue Hector, MD   Allergies:   Patient has no allergy information on record.   Social History   Socioeconomic History   Marital status: Married    Spouse name: Not on file   Number of children: 2   Years of education: Not on file   Highest education level: Not on file  Occupational History   Occupation: [employer]: Retired  Tobacco Use   Smoking status: Former    Packs/day: 0.50    Types: Cigarettes    Quit date: 1970    Years since quitting: 52.5   Smokeless tobacco: Never   Tobacco comments:    Smoked on and off  Vaping Use   Vaping Use: Never used  Substance and Sexual Activity   Alcohol use: No   Drug use: No   Sexual activity: Yes  Other Topics Concern   Not on file  Social History Narrative   Married    Originally from [place] moved to [place] age 14 moved to [place]   1 son Foye Damron in [place]   1dtr [place] in [employer]       Has living will   Wife  is health care POA--then son   Would accept resuscitation   No tube feeds if cognitively unaware   Social Determinants of Health   Financial Resource Strain: Not on file  Food Insecurity: Not on file  Transportation Needs: Not on file  Physical Activity: Not on file  Stress: Not on file  Social Connections: Not on file     Family History:  The patient's family history includes COPD in his father; Emphysema in his father; Healthy in his brother.   ROS:   Please see the history of present illness.    ROS All other systems reviewed and are negative.   PHYSICAL EXAM:   VS:  BP (!) 146/82   Pulse 60   Ht 6' (1.829 m)  Wt 78 kg   BMI 23.33 kg/m     Affect appropriate Healthy:  appears stated age 24: normal Neck supple with no adenopathy JVP normal no bruits no thyromegaly Lungs clear with no wheezing and good diaphragmatic motion Heart:  S1/S2 apical MR murmur, no rub, gallop or click PMI  Enlarged  post sternotomy  Abdomen: benighn, BS positve, no tenderness, no AAA no bruit.  No HSM or HJR Foley catheter strapped to right leg  Distal pulses intact with no bruits Plus one edema bad varicosities bilaterally chronic stasis  Neuro non-focal Skin warm and dry No muscular weakness   Wt Readings from Last 3 Encounters:  09/06/20 78 kg  08/31/20 81.2 kg  11/22/19 77.1 kg      Studies/Labs Reviewed:   EKG:  9/10/21afib RBBB LAD old IMI rate 66   Recent Labs: 08/31/2020: ALT 11; BUN 15; Creatinine, Ser 1.24; Hemoglobin 14.2; Platelets 95; Potassium 4.1; Sodium 144   Lipid Panel    Component Value Date/Time   CHOL 96 (L) 08/31/2020 1011   TRIG 53 08/31/2020 1011   HDL 41 08/31/2020 1011   CHOLHDL 2.3 08/31/2020 1011   CHOLHDL 6 04/22/2017 1431   VLDL 9 10/15/2016 1155   LDLCALC 42 08/31/2020 1011   LDLDIRECT 119.0 04/22/2017 1431    Additional studies/ records that were reviewed today include:   Echocardiogram: 04/2018  1. The left ventricle has  moderate-severely reduced systolic function, with an ejection fraction of 30-35%. The cavity size was mildly dilated. There is moderately increased left ventricular wall thickness. Left ventricular diastolic Doppler parameters  are consistent with pseudonormalization. Basal inferoseptal akinesis, basal inferior and basal inferolateral aneurysm. Mid inferior and mid inferolateral akinesis. 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  2. Left atrial size was moderately dilated.  3. Right atrial size was mildly dilated.  4. There is mild mitral annular calcification present. Mitral valve regurgitation is moderate by color flow Doppler. Suspect infarct-related MR in the setting of inferior/inferolateral wall motion abnormalities. No evidence of mitral valve stenosis.  5. The aortic valve is tricuspid Mild calcification of the aortic valve. no stenosis of the aortic valve.  6. There is mild dilatation of the ascending aorta measuring 40 mm.  7. The IVC was normal in size. PA systolic pressure 39 mmHg.    ASSESSMENT & PLAN:    1. Acute on chronic combined CHF  - LVEF of 30-35% by last echo 06/24/19. On excellent Rx with digoxin, lasix, aldactone Toprol and entresto will update echo since meds optimized   2. CAD s/p CABG -Recent EKG with nonspecific changes.  Patient denies any anginal symptoms.  Should be on 81 mg ASA   3 PAF - on coumadin , Xarelto too expensive Failed cardioversion and medical Rx with amiodarone Seen by Dr Rayann Heman 12/25/18 and ablation deferred adopted strategy of rate control and anticoagulation LA dimension by echo 51 mm Did not get INR check from 03/10/20 to 07/27/20 most recent 09/06/2020 3.2   5.  Mitral regurgitation  -ischemic  Mild / moderate by echo 06/24/19   6. Edema:  Discussed seeing vein doctors at [facility] Compression stockings and diuretic with elevation of legs   7. Urology:  F/u post cystoscopy / foley out    Medication Adjustments/Labs and Tests Ordered:  Echo for ischemic DCM F/U in  6 months  Encouraged regular f/u with coumadin clinic   Signed, Jenkins Rouge, MD  09/06/2020 2:48 PM  [facility] [place], [place], [place]  [address] Phone: [phone]; Fax: [phone]

## 2020-08-31 ENCOUNTER — Ambulatory Visit (INDEPENDENT_AMBULATORY_CARE_PROVIDER_SITE_OTHER): Payer: HMO | Admitting: Family Medicine

## 2020-08-31 ENCOUNTER — Encounter: Payer: Self-pay | Admitting: Family Medicine

## 2020-08-31 ENCOUNTER — Other Ambulatory Visit: Payer: Self-pay

## 2020-08-31 VITALS — BP 132/84 | HR 80 | Wt 179.0 lb

## 2020-08-31 DIAGNOSIS — Z1159 Encounter for screening for other viral diseases: Secondary | ICD-10-CM

## 2020-08-31 DIAGNOSIS — I5042 Chronic combined systolic (congestive) and diastolic (congestive) heart failure: Secondary | ICD-10-CM | POA: Diagnosis not present

## 2020-08-31 DIAGNOSIS — I25758 Atherosclerosis of native coronary artery of transplanted heart with other forms of angina pectoris: Secondary | ICD-10-CM

## 2020-08-31 DIAGNOSIS — J301 Allergic rhinitis due to pollen: Secondary | ICD-10-CM

## 2020-08-31 MED ORDER — LORATADINE 10 MG PO TABS: 10.0000 mg | ORAL_TABLET | Freq: Every day | ORAL | 2 refills | Status: DC

## 2020-08-31 NOTE — Patient Instructions (Addendum)
Good to see you today - Thank you for coming in  Things we discussed today:  Legs - wear compression support socks and use vaseline every night on your legs   Nose - use nasal saline when you first wake up and try the allergy medication every day   I will call you if your tests are not good.  Otherwise, I will send you a message on MyChart (if it is active) or a letter in the mail..  If you do not hear from me with in 2 weeks please call our office.     Please always bring your medication bottles  Come back to see me in 6 months to talk about your goals

## 2020-09-01 ENCOUNTER — Encounter: Payer: Self-pay | Admitting: Family Medicine

## 2020-09-01 DIAGNOSIS — J309 Allergic rhinitis, unspecified: Secondary | ICD-10-CM

## 2020-09-01 HISTORY — DX: Allergic rhinitis, unspecified: J30.9

## 2020-09-01 LAB — LIPID PANEL
Chol/HDL Ratio: 2.3 ratio (ref 0.0–5.0)
Cholesterol, Total: 96 mg/dL — ABNORMAL LOW (ref 100–199)
HDL: 41 mg/dL (ref >39)
LDL Chol Calc (NIH): 42 mg/dL (ref 0–99)
Triglycerides: 53 mg/dL (ref 0–149)
VLDL Cholesterol Cal: 13 mg/dL (ref 5–40)

## 2020-09-01 LAB — CBC
Hematocrit: 41.5 % (ref 37.5–51.0)
Hemoglobin: 14.2 g/dL (ref 13.0–17.7)
MCH: 31.1 pg (ref 26.6–33.0)
MCHC: 34.2 g/dL (ref 31.5–35.7)
MCV: 91 fL (ref 79–97)
Platelets: 95 10*3/uL — CL (ref 150–450)
RBC: 4.56 x10E6/uL (ref 4.14–5.80)
RDW: 14.7 % (ref 11.6–15.4)
WBC: 4.9 10*3/uL (ref 3.4–10.8)

## 2020-09-01 LAB — CMP14+EGFR
ALT: 11 IU/L (ref 0–44)
AST: 17 IU/L (ref 0–40)
Albumin/Globulin Ratio: 2.5 — ABNORMAL HIGH (ref 1.2–2.2)
Albumin: 4.5 g/dL (ref 3.7–4.7)
Alkaline Phosphatase: 95 IU/L (ref 44–121)
BUN/Creatinine Ratio: 12 (ref 10–24)
BUN: 15 mg/dL (ref 8–27)
Bilirubin Total: 3.8 mg/dL — ABNORMAL HIGH (ref 0.0–1.2)
CO2: 26 mmol/L (ref 20–29)
Calcium: 9 mg/dL (ref 8.6–10.2)
Chloride: 106 mmol/L (ref 96–106)
Creatinine, Ser: 1.24 mg/dL (ref 0.76–1.27)
Globulin, Total: 1.8 g/dL (ref 1.5–4.5)
Glucose: 92 mg/dL (ref 65–99)
Potassium: 4.1 mmol/L (ref 3.5–5.2)
Sodium: 144 mmol/L (ref 134–144)
Total Protein: 6.3 g/dL (ref 6.0–8.5)
eGFR: 59 mL/min/{1.73_m2} — ABNORMAL LOW (ref >59)

## 2020-09-01 LAB — HEPATITIS C ANTIBODY: Hep C Virus Ab: <0.1 s/co ratio (ref 0.0–0.9)

## 2020-09-01 NOTE — Progress Notes (Signed)
    SUBJECTIVE:   CHIEF COMPLAINT / HPI:   Here for establish care visit and physical Does not have any specific concerns except for   Nasal Congestion Often nose feels congested with frequent sneezing.  No shortness of breath or bleeding.  Has not tried antihistamine  CAD - sees Dr Roxy Horseman.  Attests to taking all his medications as per list.  Does take a Lasix tablet as needed several times a month for swelling.  No active bleeding but bruises very easily on warfarin.   PERTINENT  PMH / PSH:  Social History   Social History Narrative   Married    Originally from [place] moved to [place] age 29 moved to [place] 2018   1 son Declyn Delsol in [place]   1dtr [place] in [place]       Has living will   Wife is health care POA--then son   Would accept resuscitation   No tube feeds if cognitively unaware     OBJECTIVE:   BP 132/84   Pulse 80   Wt 179 lb (81.2 kg)   SpO2 100%   BMI 24.28 kg/m   By himself. Conversant often joking Ears:  External ear exam shows no significant lesions or deformities.  Otoscopic examination reveals clear canals, tympanic membranes are intact bilaterally without bulging, retraction, inflammation or discharge. Hearing is grossly normal bilaterall Neck:  No deformities, thyromegaly, masses, or tenderness noted.   Supple with full range of motion without pain. Heart - irreg Lungs:  Normal respiratory effort, chest expands symmetrically. Lungs are clear to auscultation, no crackles or wheezes. Mobility:able to get up and down from exam table without assistance or distress 1+ edema at ankles but diffuse small bruises on legs also diffusely dryu  ASSESSMENT/PLAN:   Allergic rhinitis Symptoms consistent with allergies - Trial of loratadine    Annual Examination Male over 80 yo  I reviewed the following patient responses on our Physical Exam Form Tobacco use and candidacy for lung cancer or aneurysm screening Alcohol Use  Weight   Exercise  Risk for STI  Fall risk Advanced Directive  PHQ9 score reviewed  Blood pressure reviewed  I considered the following items based upon USPSTF recommendations: Colon cancer screening HIV testing:  Hepatitis C testing Cholesterol screening STI screening if high risk (Hepatitis B, Syphilis, Gonorrhea, Chlamydia) Immunizations - Influenza, Covid, [place], Pneumonia, Tetanus  See After Visit Summary for recommendations    Lind Covert, MD [place]

## 2020-09-01 NOTE — Assessment & Plan Note (Signed)
Symptoms consistent with allergies - Trial of loratadine

## 2020-09-04 ENCOUNTER — Other Ambulatory Visit: Payer: Self-pay | Admitting: Cardiovascular Disease

## 2020-09-05 ENCOUNTER — Ambulatory Visit: Payer: Medicare Other | Admitting: Cardiovascular Disease

## 2020-09-05 NOTE — Telephone Encounter (Signed)
Prescription refill request received for warfarin Lov: 10/30/2019 Next INR check: 7/19 Warfarin tablet strength: 5mg 

## 2020-09-06 ENCOUNTER — Encounter: Payer: Self-pay | Admitting: Cardiovascular Disease

## 2020-09-06 ENCOUNTER — Ambulatory Visit (INDEPENDENT_AMBULATORY_CARE_PROVIDER_SITE_OTHER): Payer: HMO | Admitting: Cardiovascular Disease

## 2020-09-06 ENCOUNTER — Ambulatory Visit (INDEPENDENT_AMBULATORY_CARE_PROVIDER_SITE_OTHER): Payer: HMO | Admitting: *Deleted

## 2020-09-06 ENCOUNTER — Telehealth: Payer: Self-pay

## 2020-09-06 ENCOUNTER — Other Ambulatory Visit: Payer: Self-pay

## 2020-09-06 VITALS — BP 146/82 | HR 60 | Ht 72.0 in | Wt 172.0 lb

## 2020-09-06 DIAGNOSIS — Z5181 Encounter for therapeutic drug level monitoring: Secondary | ICD-10-CM | POA: Diagnosis not present

## 2020-09-06 DIAGNOSIS — I340 Nonrheumatic mitral (valve) insufficiency: Secondary | ICD-10-CM

## 2020-09-06 DIAGNOSIS — I480 Paroxysmal atrial fibrillation: Secondary | ICD-10-CM

## 2020-09-06 DIAGNOSIS — I420 Dilated cardiomyopathy: Secondary | ICD-10-CM

## 2020-09-06 DIAGNOSIS — I872 Venous insufficiency (chronic) (peripheral): Secondary | ICD-10-CM | POA: Diagnosis not present

## 2020-09-06 DIAGNOSIS — Z951 Presence of aortocoronary bypass graft: Secondary | ICD-10-CM | POA: Diagnosis not present

## 2020-09-06 LAB — POCT INR: INR: 3.2 — AB (ref 2.0–3.0)

## 2020-09-06 MED ORDER — FUROSEMIDE 20 MG PO TABS: 20.0000 mg | ORAL_TABLET | Freq: Every day | ORAL | 3 refills | Status: DC

## 2020-09-06 NOTE — Patient Instructions (Addendum)
Medication Instructions:  *If you need a refill on your cardiac medications before your next appointment, please call your pharmacy*  Lab Work: If you have labs (blood work) drawn today and your tests are completely normal, you will receive your results only by: [place] (if you have MyChart) OR A paper copy in the mail If you have any lab test that is abnormal or we need to change your treatment, we will call you to review the results.  Testing/Procedures: Your physician has requested that you have an echocardiogram in 6 months. Echocardiography is a painless test that uses sound waves to create images of your heart. It provides your doctor with information about the size and shape of your heart and how well your heart's chambers and valves are working. This procedure takes approximately one hour. There are no restrictions for this procedure.  Follow-Up: At [facility], you and your health needs are our priority.  As part of our continuing mission to provide you with exceptional heart care, we have created designated Provider Care Teams.  These Care Teams include your primary Cardiologist (physician) and Advanced Practice Providers (APPs -  Physician Assistants and Nurse Practitioners) who all work together to provide you with the care you need, when you need it.  We recommend signing up for the patient portal called "MyChart".  Sign up information is provided on this After Visit Summary.  MyChart is used to connect with patients for Virtual Visits (Telemedicine).  Patients are able to view lab/test results, encounter notes, upcoming appointments, etc.  Non-urgent messages can be sent to your provider as well.   To learn more about what you can do with MyChart, go to [email or web address].    Your next appointment:   6 month(s)  The format for your next appointment:   In Person  Provider:   You may see Jenkins Rouge, MD or one of the following Advanced Practice Providers on your  designated Care Team:   Cecilie Kicks, NP   [facility] [phone] [address]

## 2020-09-06 NOTE — Telephone Encounter (Signed)
Patient walks into [facility] for recent blood work to show his Cardiologist. Blood work given along with [employer], retail.  Patient called nurse line shortly after wanting to know why his platelets are low and if he needs to start a medication.   Please advise.

## 2020-09-06 NOTE — Patient Instructions (Addendum)
Description    Hold warfarin today and then continue taking warfarin 1 tablet daily except for 1/2 a tablet on Sundays. Recheck INR in  3 weeks. Call coumadin clinic for any changes in medications or upcoming procedures. [phone]

## 2020-09-07 MED ORDER — FUROSEMIDE 40 MG PO TABS: 40.0000 mg | ORAL_TABLET | Freq: Every day | ORAL | 3 refills | Status: DC

## 2020-09-07 NOTE — Addendum Note (Signed)
Addended by: Aris Georgia, Ariston Grandison L on: 09/07/2020 11:06 AM   Modules accepted: Orders

## 2020-09-08 NOTE — Telephone Encounter (Signed)
Left vm to call us and leave times and number to call

## 2020-09-14 ENCOUNTER — Telehealth: Payer: Self-pay

## 2020-09-14 NOTE — Telephone Encounter (Signed)
**Note De-Identified Yussef Jorge Obfuscation** Per letter received from [employer] pt asst Foundation a [place] PA is required to be done prior to them considering the pt for asst with Entresto. I have done the [place] PA through covermymeds. Key: [id number]

## 2020-09-20 ENCOUNTER — Other Ambulatory Visit: Payer: Self-pay | Admitting: Cardiovascular Disease

## 2020-09-20 NOTE — Telephone Encounter (Signed)
**Note De-Identified Bahar Shelden Obfuscation** Letter received from [facility] stating that they denied the pts [place] PA, I have done another [place] PA through covermymeds as the pts insurance is [employer] not [employer]. Key: [id number]

## 2020-09-20 NOTE — Telephone Encounter (Signed)
**Note De-Identified Louie Flenner Obfuscation** [name] Key: [id number] - PA Case ID: [id number] Outcome:Approved today Coverage Starts on: 09/20/2020 12:00:00 AM, Coverage Ends on: 09/20/2021 12:00:00 AM. Drug [name] 49-'51MG'$  tablets Form: [employer] Medicare 4-Part Electronic PA Form (2017 NCPDP)  I have notified [facility] of this approval.

## 2020-09-26 ENCOUNTER — Telehealth: Payer: Self-pay | Admitting: Cardiovascular Disease

## 2020-09-26 NOTE — Telephone Encounter (Signed)
Patient states he is returning a call from [facility]. I did not see any notes.

## 2020-09-26 NOTE — Telephone Encounter (Signed)
Called patient back. Made patient an appointment to follow up with Dr. Johnsie Cancel, now that the schedule is out for 6 months. Patient verbalized understanding.

## 2020-10-03 ENCOUNTER — Other Ambulatory Visit: Payer: Self-pay | Admitting: Cardiovascular Disease

## 2020-10-05 ENCOUNTER — Other Ambulatory Visit: Payer: Self-pay

## 2020-10-05 ENCOUNTER — Ambulatory Visit (INDEPENDENT_AMBULATORY_CARE_PROVIDER_SITE_OTHER): Payer: HMO

## 2020-10-05 ENCOUNTER — Ambulatory Visit (HOSPITAL_COMMUNITY): Payer: HMO | Attending: Cardiology

## 2020-10-05 DIAGNOSIS — Z5181 Encounter for therapeutic drug level monitoring: Secondary | ICD-10-CM

## 2020-10-05 DIAGNOSIS — I420 Dilated cardiomyopathy: Secondary | ICD-10-CM | POA: Diagnosis not present

## 2020-10-05 DIAGNOSIS — Z951 Presence of aortocoronary bypass graft: Secondary | ICD-10-CM

## 2020-10-05 LAB — ECHOCARDIOGRAM COMPLETE
Area-P 1/2: 3.1 cm2
MV M vel: 4.5 m/s
MV Peak grad: 81 mmHg
Radius: 0.8 cm
S' Lateral: 5.5 cm

## 2020-10-05 LAB — POCT INR: INR: 2.1 (ref 2.0–3.0)

## 2020-10-05 MED ORDER — PERFLUTREN LIPID MICROSPHERE
1.0000 mL | INTRAVENOUS | Status: AC | PRN
  Administered 2020-10-05: 2 mL via INTRAVENOUS

## 2020-10-05 NOTE — Patient Instructions (Addendum)
Description   Continue taking warfarin 1 tablet daily except for 1/2 a tablet on Sundays. Recheck INR in 6 weeks, per pt request. Call coumadin clinic for any changes in medications or upcoming procedures. [phone]

## 2020-10-13 ENCOUNTER — Telehealth: Payer: Self-pay | Admitting: Cardiovascular Disease

## 2020-10-13 DIAGNOSIS — I480 Paroxysmal atrial fibrillation: Secondary | ICD-10-CM

## 2020-10-13 NOTE — Progress Notes (Deleted)
Called patient with results of echo.  Per Dr. Johnsie Cancel, EF same and MR looks moderate to me Has stable but large inferior basal aneurysm Cannot evaluate this with MRI claustrophobic. He has seen [facility] before and deferred ablation or AICD. Call patient and confirm he does not want to entertain the idea of BiV AICD He is on good medical Rx If he wants to pursue BiV AICD have him see Dr Quentin Ore to discuss ECG with QRS 164 msec RBBB/LAD from IMI    Patient stated he would discuss it with Dr. Johnsie Cancel tomorrow at his wife's appointment.

## 2020-10-13 NOTE — Telephone Encounter (Signed)
Patient states he was returning call

## 2020-10-13 NOTE — Telephone Encounter (Signed)
Called patient with results of echo.  Per Dr. Johnsie Cancel, EF same and MR looks moderate to me Has stable but large inferior basal aneurysm Cannot evaluate this with MRI claustrophobic. He has seen [facility] before and deferred ablation or AICD. Call patient and confirm he does not want to entertain the idea of BiV AICD He is on good medical Rx If he wants to pursue BiV AICD have him see Dr Quentin Ore to discuss ECG with QRS 164 msec RBBB/LAD from IMI    Patient stated he would discuss it with Dr. Johnsie Cancel tomorrow at his wife's appointment.

## 2020-10-14 NOTE — Addendum Note (Signed)
Addended by: Aris Georgia, Kaydon Husby L on: 10/14/2020 10:25 AM   Modules accepted: Orders

## 2020-10-21 ENCOUNTER — Other Ambulatory Visit: Payer: Self-pay | Admitting: Cardiovascular Disease

## 2020-11-01 ENCOUNTER — Other Ambulatory Visit: Payer: Self-pay | Admitting: Cardiovascular Disease

## 2020-11-01 NOTE — Telephone Encounter (Signed)
Prescription refill request received for warfarin Lov: 09/06/2020, Nishan Next INR check: 9/28 Warfarin tablet strength:  '5mg'$    Refill sent.

## 2020-11-07 ENCOUNTER — Telehealth: Payer: Self-pay

## 2020-11-07 NOTE — Telephone Encounter (Signed)
Patient calls nurse line [facility] [address]. Patient would like to be called once the order has been sent over.   Talbot Grumbling, RN

## 2020-11-08 ENCOUNTER — Telehealth: Payer: Self-pay | Admitting: Cardiovascular Disease

## 2020-11-08 NOTE — Telephone Encounter (Signed)
Returned the call to pt.  Left a message that the refill for Montelukast, he requested, would have to come from his PCP.

## 2020-11-08 NOTE — Telephone Encounter (Signed)
*  STAT* If patient is at the pharmacy, call can be transferred to refill team.   1. Which medications need to be refilled? (please list name of each medication and dose if known) Montelukast 20mg   2. Which pharmacy/location (including street and city if local pharmacy) is medication to be sent to? [id number] [place] [place] - [address]  3. Do they need a 30 day or 90 day supply? 90 day supply

## 2020-11-09 MED ORDER — ZOSTER VAC RECOMB ADJUVANTED 50 MCG/0.5ML IM SUSR: INTRAMUSCULAR | 1 refills | Status: DC

## 2020-11-09 NOTE — Telephone Encounter (Signed)
Pls let him know I sent in the Rx to his pharmacy for shingles vaccine  Thanks

## 2020-11-10 NOTE — Telephone Encounter (Signed)
Called and informed patient via voice mail of Shingles RX.  Ozella Almond, [place]

## 2020-11-16 ENCOUNTER — Other Ambulatory Visit: Payer: Self-pay

## 2020-11-16 ENCOUNTER — Encounter (INDEPENDENT_AMBULATORY_CARE_PROVIDER_SITE_OTHER): Payer: Self-pay

## 2020-11-16 ENCOUNTER — Ambulatory Visit (INDEPENDENT_AMBULATORY_CARE_PROVIDER_SITE_OTHER): Payer: HMO

## 2020-11-16 DIAGNOSIS — Z951 Presence of aortocoronary bypass graft: Secondary | ICD-10-CM | POA: Diagnosis not present

## 2020-11-16 DIAGNOSIS — Z5181 Encounter for therapeutic drug level monitoring: Secondary | ICD-10-CM | POA: Diagnosis not present

## 2020-11-16 LAB — POCT INR: INR: 1.9 — AB (ref 2.0–3.0)

## 2020-11-16 NOTE — Patient Instructions (Signed)
Description   Take 1.5 tablets today and then continue taking warfarin 1 tablet daily except for 1/2 a tablet on Sundays. Recheck INR in 6 weeks, per pt request. Call coumadin clinic for any changes in medications or upcoming procedures. [phone]

## 2020-11-23 ENCOUNTER — Telehealth: Payer: Self-pay | Admitting: Cardiovascular Disease

## 2020-11-23 ENCOUNTER — Telehealth: Payer: Self-pay | Admitting: Internal Medicine

## 2020-11-23 NOTE — Telephone Encounter (Signed)
Patient needed a letter for court stating he has an appointment with Dr. Quentin Ore on 12/07/20 and Dr. Johnsie Cancel has referred him. Typed up letter and had it sign. Will leave a front desk for patient to pick up.

## 2020-11-23 NOTE — Telephone Encounter (Signed)
  Pt is requesting to speak with Pam, he said its very important that he speaks with her

## 2020-11-23 NOTE — Telephone Encounter (Signed)
CORRECTION, its to see Dr. Quentin Ore not Dr. Caryl Comes

## 2020-11-23 NOTE — Telephone Encounter (Signed)
Patient needs an appointment reminder too see Dr. Caryl Comes emailed to him. Please advise.

## 2020-11-24 NOTE — Telephone Encounter (Signed)
Pt is calling back because he did not receive a callback on 11/23/20. Please advise pt further

## 2020-11-29 ENCOUNTER — Other Ambulatory Visit: Payer: Self-pay | Admitting: Cardiovascular Disease

## 2020-12-06 NOTE — Progress Notes (Signed)
Electrophysiology Office Note:    Date:  12/07/2020   ID:  Jason Day, DOB 06/20/1940, MRN [id number]  PCP:  Jason Covert, MD  [facility] Cardiologist:  Jason Rouge, MD  [facility] Electrophysiologist:  Jason Epley, MD   Referring MD: Jason Hector, MD   Chief Complaint: Ischemic cardiomyopathy  History of Present Illness:    Jason Day is a 80 y.o. male who presents for an evaluation of ischemic cardiomyopathy at the request of Jason Day. Their medical history includes coronary artery disease post CABG, moderate MR, permanent atrial fibrillation, right bundle branch block.  The patient has a persistently reduced left ventricular function.  The patient previously saw Jason Day in June 2021.  At that time, ICD was not thought to be a good option given his history and advanced age and was also not desired by the patient his family. Today he is with his wife in clinic.  He confirms the above history.   Past Medical History:  Diagnosis Date   Bleeding hemorrhoids    Chronic combined systolic and diastolic heart failure (HCC)    Chronic sinus complaints    overuses afrin   Coronary atherosclerosis of native coronary artery    Dyspnea    Hyperlipidemia    Ischemic cardiomyopathy    Left atrial enlargement    Moderate mitral regurgitation    Myocardial infarction ([place]) 09/2015   Persistent atrial fibrillation (HCC)    RBBB    A- Fib    Past Surgical History:  Procedure Laterality Date   BACK SURGERY  ~2014   disc repair   CARDIOVERSION N/A 10/24/2018   Procedure: CARDIOVERSION;  Surgeon: Jason Pain, MD;  Location: [place] [facility];  Service: Cardiovascular;  Laterality: N/A;   CHOLECYSTECTOMY     COLONOSCOPY  04/06/2014   Hemorrhoids and diverticulosis performed in [place] N/A 08/04/2019   Procedure: COLONOSCOPY WITH PROPOFOL;  Surgeon: Jason Horner, MD;  [facility];  Service: Endoscopy;  Laterality: N/A;    CORONARY ARTERY BYPASS GRAFT N/A 10/18/2016   Procedure: CORONARY ARTERY BYPASS GRAFTING (CABG) x five , using left internal mammary artery and right leg greater saphenous vein harvested endoscopically;  Surgeon: Jason Pollack, MD;  Location: [place] [facility];  Service: Open Heart Surgery;  Laterality: N/A;   ESOPHAGOGASTRODUODENOSCOPY (EGD) WITH PROPOFOL N/A 08/04/2019   Procedure: ESOPHAGOGASTRODUODENOSCOPY (EGD) WITH PROPOFOL;  Surgeon: Jason Horner, MD;  [facility];  Service: Endoscopy;  Laterality: N/A;   FOOT SURGERY     HEMORRHOID BANDING     HEMORRHOID SURGERY     REVERSE SHOULDER ARTHROPLASTY Left 08/21/2018   Procedure: REVERSE SHOULDER ARTHROPLASTY;  Surgeon: Jason Britain, MD;  [facility];  Service: Orthopedics;  Laterality: Left;   RIGHT/LEFT HEART CATH AND CORONARY ANGIOGRAPHY N/A 10/16/2016   Procedure: RIGHT/LEFT HEART CATH AND CORONARY ANGIOGRAPHY;  Surgeon: Jason Crome, MD;  Location: [place] [facility];  Service: Cardiovascular;  Laterality: N/A;   stents in liver     TEE WITHOUT CARDIOVERSION N/A 10/18/2016   Procedure: TRANSESOPHAGEAL ECHOCARDIOGRAM (TEE);  Surgeon: Jason Pollack, MD;  Location: [place];  Service: Open Heart Surgery;  Laterality: N/A;   TRANSURETHRAL RESECTION OF PROSTATE N/A 11/19/2019   Procedure: CYSTOSCOPY TRANSURETHRAL RESECTION OF THE PROSTATE (TURP);  Surgeon: Jason Seal, MD;  [facility];  Service: Urology;  Laterality: N/A;    Current Medications: Current Meds  Medication Sig   atorvastatin (LIPITOR) 20 MG  tablet TAKE 1/2 TABLET BY MOUTH DAILY   digoxin (LANOXIN) 0.125 MG tablet TAKE 1/2 TABLETS BY MOUTH EVERY DAY   ENTRESTO 49-51 MG TAKE 1 TABLET BY MOUTH TWICE A DAY   loratadine (CLARITIN) 10 MG tablet Take 1 tablet (10 mg total) by mouth daily.   metoprolol succinate (TOPROL-XL) 25 MG 24 hr tablet TAKE 1/2 TABLET BY MOUTH EVERY DAY   spironolactone (ALDACTONE) 25 MG tablet Take 1 tablet (25 mg total) by mouth daily.   warfarin  (COUMADIN) 5 MG tablet TAKE 1/2 TABLET TO 1 TABLET BY MOUTH DAILY AS DIRECTED BY THE COUMADIN CLINIC.   Zoster Vaccine Adjuvanted [facility]) injection Inject 0.5 ml IM and Repeat in 2 months     Allergies:   Patient has no allergy information on record.   Social History   Socioeconomic History   Marital status: Married    Spouse name: Not on file   Number of children: 2   Years of education: Not on file   Highest education level: Not on file  Occupational History   Occupation: [employer]: Retired  Tobacco Use   Smoking status: Former    Packs/day: 0.50    Types: Cigarettes    Quit date: 1970    Years since quitting: 52.8   Smokeless tobacco: Never   Tobacco comments:    Smoked on and off  Scientific laboratory technician Use: Never used  Substance and Sexual Activity   Alcohol use: No   Drug use: No   Sexual activity: Yes  Other Topics Concern   Not on file  Social History Narrative   Married    Originally from [place] moved to [place] age 43 moved to [place]   1 son Jason Day in [place]   1dtr Jason Day in [employer]       Has living will   Wife is health care POA--then son   Would accept resuscitation   No tube feeds if cognitively unaware   Social Determinants of Health   Financial Resource Strain: Not on file  Food Insecurity: Not on file  Transportation Needs: Not on file  Physical Activity: Not on file  Stress: Not on file  Social Connections: Not on file     Family History: The patient's family history includes COPD in his father; Emphysema in his father; Healthy in his brother. There is no history of Diabetes, Cancer, Stomach cancer, or Colon cancer.  ROS:   Please see the history of present illness.    All other systems reviewed and are negative.  EKGs/Labs/Other Studies Reviewed:    The following studies were reviewed today:   October 05, 2020 echo personally reviewed Left ventricular function moderately  decreased, 35% Global hypokinesis RV function is normal Dilated left and right atria Moderate to severe MR   EKG:  The ekg ordered today demonstrates atrial fibrillation with a right bundle branch block.  QRS duration is about 140 ms.   Recent Labs: 08/31/2020: ALT 11; BUN 15; Creatinine, Ser 1.24; Hemoglobin 14.2; Platelets 95; Potassium 4.1; Sodium 144  Recent Lipid Panel    Component Value Date/Time   CHOL 96 (L) 08/31/2020 1011   TRIG 53 08/31/2020 1011   HDL 41 08/31/2020 1011   CHOLHDL 2.3 08/31/2020 1011   CHOLHDL 6 04/22/2017 1431   VLDL 9 10/15/2016 1155   LDLCALC 42 08/31/2020 1011   LDLDIRECT 119.0 04/22/2017 1431    Physical Exam:    VS:  Ht 6' (1.829 m)   Wt 171 lb (77.6 kg)   SpO2 91%   BMI 23.19 kg/m     Wt Readings from Last 3 Encounters:  12/07/20 171 lb (77.6 kg)  09/06/20 172 lb (78 kg)  08/31/20 179 lb (81.2 kg)     GEN:  Well nourished, well developed in no acute distress.   HEENT: Normal NECK: No JVD; No carotid bruits LYMPHATICS: No lymphadenopathy CARDIAC: Irregularly irregular, 2 out of 6 holosystolic murmur.  No rubs or gallops. RESPIRATORY:  Clear to auscultation without rales, wheezing or rhonchi  ABDOMEN: Soft, non-tender, non-distended MUSCULOSKELETAL:  No edema; No deformity  SKIN: Warm and dry NEUROLOGIC:  Alert and oriented x 3 PSYCHIATRIC:  Normal affect       ASSESSMENT:    1. Chronic combined systolic and diastolic heart failure ([place])   2. Permanent atrial fibrillation ([place])   3. Coronary artery disease without angina pectoris, unspecified vessel or lesion type, unspecified whether native or transplanted heart   4. S/P CABG x 5    PLAN:    In order of problems listed above:  #Chronic combined systolic and diastolic heart failure Follows with Jason Day.  Continue metoprolol, Entresto, spironolactone and digoxin.  Today we discussed the role of CRT-D and his cardiomyopathy management.  He has a narrow right bundle  branch block on his EKG.  For this reason I do not think he has a high chance of responding to CRT therapy.  Given his age, ICD therapy is not indicated for primary prevention.  I discussed this at length with the patient his wife today in clinic.  I also spoke with Jason Day on the phone to update him.   #Coronary artery disease post CABG No ischemic symptoms.  Continue Lipitor.   Follow-up with EP as needed.  Medication Adjustments/Labs and Tests Ordered: Current medicines are reviewed at length with the patient today.  Concerns regarding medicines are outlined above.  No orders of the defined types were placed in this encounter.  No orders of the defined types were placed in this encounter.    Signed, Hilton Cork. Quentin Ore, MD, [facility], [facility] 12/07/2020 11:27 AM    Electrophysiology [facility]

## 2020-12-07 ENCOUNTER — Encounter: Payer: Self-pay | Admitting: Cardiology

## 2020-12-07 ENCOUNTER — Ambulatory Visit (INDEPENDENT_AMBULATORY_CARE_PROVIDER_SITE_OTHER): Payer: HMO | Admitting: Cardiology

## 2020-12-07 ENCOUNTER — Other Ambulatory Visit: Payer: Self-pay

## 2020-12-07 VITALS — BP 120/80 | HR 69 | Ht 72.0 in | Wt 171.0 lb

## 2020-12-07 DIAGNOSIS — Z951 Presence of aortocoronary bypass graft: Secondary | ICD-10-CM | POA: Diagnosis not present

## 2020-12-07 DIAGNOSIS — I2510 Atherosclerotic heart disease of native coronary artery without angina pectoris: Secondary | ICD-10-CM

## 2020-12-07 DIAGNOSIS — I4821 Permanent atrial fibrillation: Secondary | ICD-10-CM

## 2020-12-07 DIAGNOSIS — I5042 Chronic combined systolic (congestive) and diastolic (congestive) heart failure: Secondary | ICD-10-CM

## 2020-12-07 NOTE — Patient Instructions (Addendum)

## 2020-12-15 ENCOUNTER — Other Ambulatory Visit: Payer: Self-pay | Admitting: Cardiovascular Disease

## 2020-12-22 ENCOUNTER — Telehealth: Payer: Self-pay | Admitting: Cardiovascular Disease

## 2020-12-22 NOTE — Telephone Encounter (Signed)
Pt c/o Shortness Of Breath: STAT if SOB developed within the last 24 hours or pt is noticeably SOB on the phone  1. Are you currently SOB (can you hear that pt is SOB on the phone)? NO PT IS NOT SOB NOW, ONLY WHEN HE IS SLEEPING. IT WAKES PT UP AT NIGHT  2. How long have you been experiencing SOB? PT EXPERIENCES SOB AT NIGHT.  3. Are you SOB when sitting or when up moving around? ONLY WHEN HE IS SLEEPING  4. Are you currently experiencing any other symptoms? PT WANTS TO KNOW IF HE CAN GET AN OXYGEN TANK. HE SAID HE IS NOT GOING TO GO THE ER BECAUSE HE DOESN'T WANT TO GO.

## 2020-12-22 NOTE — Telephone Encounter (Signed)
Called patient back about his message. Patient stated he has SOB when sleeping and has some swelling still in his BLE. Patient stated he takes his lasix now and then, but not every day. Informed patient, that he needs to start taking his Lasix daily and see if that helps. Will forward to Dr. Johnsie Cancel for further advisement.

## 2020-12-23 NOTE — Telephone Encounter (Signed)
Patient was returning call. Please advise ?

## 2020-12-23 NOTE — Telephone Encounter (Signed)
Patient canceled his appointment for 11/07 with Dr. Angelena Form and said he sees Dr. Johnsie Cancel only. He advised me to send a message to [facility] and have her make room for Dr. Johnsie Cancel to see him. He said if an appointment is not made he will be dead in a week. Please advise.

## 2020-12-23 NOTE — Telephone Encounter (Signed)
Called a spoke with the patient about needing an appointment to be evaluated regarding his SOB and edema; that Dr. Johnsie Cancel would like him to come in on Monday and see the DOD for testing and in person evaluation based on his symptoms. The patient was upset and frustrated at the situation about having appointment with a different doctor and kept saying I wish they knew what was wrong with me. Educated the patient about his known CAD with ischemic DCM EF 30-35% and significant MR. Tried to reassure the patient and discuss that the doctor wanted him to be seen on Monday so we can help him feel better and hopefully avoid the ED. He is insistent on only seeing Dr. Johnsie Cancel. Made appointment for Dr. Johnsie Cancel on 11/8 for 48 hour acute appointment. ED precautions given.  Verbalized agreement and understanding.

## 2020-12-23 NOTE — Progress Notes (Signed)
Cardiology Office Note    Date:  12/27/2020   ID:  Jason Day, DOB [birth year], MRN [id number]  PCP:  Jason Covert, MD  Cardiologist:  Dr. Johnsie Day   Chief Complaint: Edema / CHF   History of Present Illness:   Jason Day is a 80 y.o. male CAD s/p CABG, ICM, PAF,  MR and HLD seen in f/u   He initially presented 09/2016 w/ acute pulmonary edema and diuresed. Echo showed reduced LVEF, down to 30%, leading to LHC, which showed severe multivessel CAD. 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 Post of afib >> on amiodarone and coumadin. He is claustrophobic and cannot have MRI for EF calculation  He has had recurrent afib and failed medical Rx with amiodarone LA 51 mm diameter on TTE Seen by Jason Day 12/25/18 and ablation deferred plan for rate control and anticoagulation strategy Xarelto too expensive on coumadin now   Last echo  10/05/20 with EF 30-35% moderate bi atrial enlargement moderate to severe MR   The patient had L rotator cuff arthropathy 08/21/18.   Currently his edema is from LE venous disease and not CHF  Had benign upper endoscopy and colonoscopy 08/04/19 Dr Jason Day  Also had some urinary retention requiring foley and cystoscopy  Wife notes some behavioral issues and very argumentative   Called office last week and complained of increasing dyspnea PND and orthopnea has been taking his aldactone and lasix Most recent INR 1.9 11/16/20 His baseline ECG shows afib with LAD and RBBB Seen by Dr Jason Day most recently 12/07/20 and did not think he would benefit from CRT pacing given fairly narrow RBBB   Today he actually looks dry and has lost a lot of weight. His behavioral issues continue to exacerbate his wife. He lost 10 additional lbs with daily diuretic.   Past Medical History:  Diagnosis Date   Bleeding hemorrhoids    Chronic combined systolic and diastolic heart failure (HCC)    Chronic sinus complaints    overuses afrin   Coronary atherosclerosis of native coronary artery    Dyspnea     Hyperlipidemia    Ischemic cardiomyopathy    Left atrial enlargement    Moderate mitral regurgitation    Myocardial infarction ([place]) 09/2015   Persistent atrial fibrillation (HCC)    RBBB    A- Fib    Past Surgical History:  Procedure Laterality Date   BACK SURGERY  ~2014   disc repair   CARDIOVERSION N/A 10/24/2018   Procedure: CARDIOVERSION;  Surgeon: Jason Pain, MD;  Location: [place] [facility];  Service: Cardiovascular;  Laterality: N/A;   CHOLECYSTECTOMY     COLONOSCOPY  04/06/2014   Hemorrhoids and diverticulosis performed in [place] N/A 08/04/2019   Procedure: COLONOSCOPY WITH PROPOFOL;  Surgeon: Jason Horner, MD;  [facility];  Service: Endoscopy;  Laterality: N/A;   CORONARY ARTERY BYPASS GRAFT N/A 10/18/2016   Procedure: CORONARY ARTERY BYPASS GRAFTING (CABG) x five , using left internal mammary artery and right leg greater saphenous vein harvested endoscopically;  Surgeon: Jason Pollack, MD;  Location: [facility];  Service: Open Heart Surgery;  Laterality: N/A;   ESOPHAGOGASTRODUODENOSCOPY (EGD) WITH PROPOFOL N/A 08/04/2019   Procedure: ESOPHAGOGASTRODUODENOSCOPY (EGD) WITH PROPOFOL;  Surgeon: Jason Horner, MD;  [facility];  Service: Endoscopy;  Laterality: N/A;   FOOT SURGERY     HEMORRHOID BANDING     HEMORRHOID SURGERY     REVERSE SHOULDER ARTHROPLASTY Left 08/21/2018   Procedure: REVERSE  SHOULDER ARTHROPLASTY;  Surgeon: Jason Britain, MD;  [facility];  Service: Orthopedics;  Laterality: Left;   RIGHT/LEFT HEART CATH AND CORONARY ANGIOGRAPHY N/A 10/16/2016   Procedure: RIGHT/LEFT HEART CATH AND CORONARY ANGIOGRAPHY;  Surgeon: Jason Crome, MD;  Location: [facility];  Service: Cardiovascular;  Laterality: N/A;   stents in liver     TEE WITHOUT CARDIOVERSION N/A 10/18/2016   Procedure: TRANSESOPHAGEAL ECHOCARDIOGRAM (TEE);  Surgeon: Jason Pollack, MD;  Location: [facility];  Service: Open Heart Surgery;   Laterality: N/A;   TRANSURETHRAL RESECTION OF PROSTATE N/A 11/19/2019   Procedure: CYSTOSCOPY TRANSURETHRAL RESECTION OF THE PROSTATE (TURP);  Surgeon: Jason Seal, MD;  [facility];  Service: Urology;  Laterality: N/A;    Current Medications:  Prior to Admission medications   Medication Sig Start Date End Date Taking? Authorizing Provider  atorvastatin (LIPITOR) 20 MG tablet Take 0.5 tablets (10 mg total) by mouth daily. Patient taking differently: Take 10 mg by mouth every evening.  07/29/18  Yes Jason Hector, MD  digoxin (LANOXIN) 0.125 MG tablet Take 0.5 tablets (62.5 mcg total) by mouth daily. 06/26/18  Yes Jason Hector, MD  metoprolol succinate (TOPROL-XL) 25 MG 24 hr tablet Take 0.5 tablets (12.5 mg total) by mouth daily. Patient taking differently: Take 12.5 mg by mouth at bedtime.  06/26/18  Yes Jason Hector, MD  sacubitril-valsartan (ENTRESTO) 49-51 MG Take 1 tablet by mouth 2 (two) times daily. 07/29/18  Yes Jason Hector, MD  spironolactone (ALDACTONE) 25 MG tablet Take 0.5 tablets (12.5 mg total) by mouth daily. Patient taking differently: Take 12.5 mg by mouth every evening.  06/26/18  Yes Jason Hector, MD  warfarin (COUMADIN) 5 MG tablet Take as directed by Coumadin Clinic Patient taking differently: Take 5 mg by mouth every evening. Take as directed by Coumadin Clinic 06/26/18  Yes Jason Hector, MD   Allergies:   Patient has no allergy information on record.   Social History   Socioeconomic History   Marital status: Married    Spouse name: Not on file   Number of children: 2   Years of education: Not on file   Highest education level: Not on file  Occupational History   Occupation: [place]: Retired  Tobacco Use   Smoking status: Former    Packs/day: 0.50    Types: Cigarettes    Quit date: 1970    Years since quitting: 52.8   Smokeless tobacco: Never   Tobacco comments:    Smoked on and off  Scientific laboratory technician Use:  Never used  Substance and Sexual Activity   Alcohol use: No   Drug use: No   Sexual activity: Yes  Other Topics Concern   Not on file  Social History Narrative   Married    Originally from [place] moved to [place] age 74 moved to [place]   1 son Jason Day in [place]   1dtr Jason Day in [place]       Has living will   Wife is health care POA--then son   Would accept resuscitation   No tube feeds if cognitively unaware   Social Determinants of Health   Financial Resource Strain: Not on file  Food Insecurity: Not on file  Transportation Needs: Not on file  Physical Activity: Not on file  Stress: Not on file  Social Connections: Not on file     Family History:  The patient's family history includes COPD in his father; Emphysema in his father; Healthy in his brother.   ROS:   Please see the history of present illness.    ROS All other systems reviewed and are negative.   PHYSICAL EXAM:   VS:  There were no vitals taken for this visit.    Argumentative  Chronically ill male HEENT: normal Neck supple with no adenopathy JVP normal no bruits no thyromegaly Lungs clear with no wheezing and good diaphragmatic motion Heart:  S1/S2 apical MR murmur, no rub, gallop or click PMI  Enlarged  post sternotomy  Abdomen: benighn, BS positve, no tenderness, no AAA no bruit.  No HSM or HJR Foley catheter strapped to right leg  Distal pulses intact with no bruits Plus one edema bad varicosities bilaterally chronic stasis  Neuro non-focal Skin warm and dry No muscular weakness   Wt Readings from Last 3 Encounters:  12/07/20 171 lb (77.6 kg)  09/06/20 172 lb (78 kg)  08/31/20 179 lb (81.2 kg)      Studies/Labs Reviewed:   EKG:  9/10/21afib RBBB LAD old IMI rate 66   Recent Labs: 08/31/2020: ALT 11; BUN 15; Creatinine, Ser 1.24; Hemoglobin 14.2; Platelets 95; Potassium 4.1; Sodium 144   Lipid Panel    Component Value Date/Time   CHOL 96 (L) 08/31/2020  1011   TRIG 53 08/31/2020 1011   HDL 41 08/31/2020 1011   CHOLHDL 2.3 08/31/2020 1011   CHOLHDL 6 04/22/2017 1431   VLDL 9 10/15/2016 1155   LDLCALC 42 08/31/2020 1011   LDLDIRECT 119.0 04/22/2017 1431    Additional studies/ records that were reviewed today include:   Echo 10/05/20 IMPRESSIONS     1. Left ventricular ejection fraction, by estimation, is 30 to 35%. The  left ventricle has moderately decreased function. The left ventricle  demonstrates global hypokinesis. Left ventricular diastolic parameters are  consistent with Grade II diastolic  dysfunction (pseudonormalization).   2. Right ventricular systolic function is normal. The right ventricular  size is normal. There is mildly elevated pulmonary artery systolic  pressure.   3. Left atrial size was moderately dilated.   4. Right atrial size was moderately dilated.   5. The mitral valve is normal in structure. Moderate to severe mitral  valve regurgitation. No evidence of mitral stenosis.   6. The aortic valve is tricuspid. Aortic valve regurgitation is not  visualized. Mild to moderate aortic valve sclerosis/calcification is  present, without any evidence of aortic stenosis.   7. The inferior vena cava is dilated in size with <50% respiratory  variability, suggesting right atrial pressure of 15 mmHg.   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 Similar size as prior.      ASSESSMENT & PLAN:    1. Acute on chronic combined CHF  - LVEF of 30-35% with moderate to severe MR  Both Dr Rayann Heman and Jason Day deferred CRT pacing given age and narrow RBBB Check BNP/BMET He looks dry today and lungs are Clear check BMET/BNP   2. CAD s/p CABG -Recent EKG with nonspecific changes.  Patient denies any anginal symptoms.  Should be on 81 mg ASA   3 PAF - on coumadin , Xarelto too expensive Failed cardioversion and medical Rx with amiodarone Seen by Dr Rayann Heman 12/25/18 and ablation  deferred adopted strategy of rate control and anticoagulation LA dimension by echo 51 mm Did not get INR check from 03/10/20 to 07/27/20 most recent 12/27/2020  3.2   5.  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 Edema:  Discussed seeing vein doctors at [facility] Compression stockings and diuretic with elevation of legs   7. Urology:  F/u post cystoscopy / foley out   8. Behavioral:  needs to f/u with primary as he has dementia with behavioral issues that are getting worse. Discussed taking to primary about appetite stimulant as well Suggested Boost supplement   Medication Adjustments/Labs and Tests Ordered:  BMET/BNP, Hct F/U after Christmas   Signed, Ailey Wessling, MD  12/27/2020 9:09 AM    [address], [place], [place]  [address] Phone: [phone]; Fax: [phone]

## 2020-12-23 NOTE — Telephone Encounter (Signed)
Jason Hector, MD  You; Aris Georgia, Olin Hauser, RN; Richmond Campbell, LPN 12 minutes ago (3:42 PM)   He has known CAD with ischemic DCM EF 30-35% and significant MR. Can have him come in for ECG, CXR BMET/BNP and update echo Have him see DOD Monday should go to ER if too SOB     Left a message for the pt to call back... I have put him on the DOD schedule for Monday 12/26/20 at 11:30 am.

## 2020-12-23 NOTE — Telephone Encounter (Signed)
Pt called back to see what Dr. Johnsie Cancel recommends.. he is trying to take the lasix daily as suggested by Dr. Kyla Balzarine nurse but it has only been one day... he is very SOB at night and cannot sleep except on 2-3 pillow now which is new for him... he still has some pedal edema that does not improve with elevation...  He says he can only walk minimally 1/2 block before having to stop with increased SOB.Jason Day patient is audibly SOB on the phone... I could not tell if this is much different from his baseline... he just says that he will not go anywhere to be seen and says he just wants to sit and wait to hear back form Dr. Kyla Balzarine review.  He says he is not uncomfortable and feels this is not "urgent".   Will forward to Dr. Ellamae Sia

## 2020-12-26 ENCOUNTER — Ambulatory Visit: Payer: HMO | Admitting: Cardiovascular Disease

## 2020-12-27 ENCOUNTER — Encounter: Payer: Self-pay | Admitting: Cardiovascular Disease

## 2020-12-27 ENCOUNTER — Other Ambulatory Visit: Payer: Self-pay

## 2020-12-27 ENCOUNTER — Ambulatory Visit (INDEPENDENT_AMBULATORY_CARE_PROVIDER_SITE_OTHER): Payer: HMO | Admitting: Cardiovascular Disease

## 2020-12-27 VITALS — BP 100/62 | HR 70 | Ht 72.0 in | Wt 162.0 lb

## 2020-12-27 DIAGNOSIS — Z951 Presence of aortocoronary bypass graft: Secondary | ICD-10-CM

## 2020-12-27 DIAGNOSIS — I5042 Chronic combined systolic (congestive) and diastolic (congestive) heart failure: Secondary | ICD-10-CM | POA: Diagnosis not present

## 2020-12-27 DIAGNOSIS — R0602 Shortness of breath: Secondary | ICD-10-CM | POA: Diagnosis not present

## 2020-12-27 DIAGNOSIS — I340 Nonrheumatic mitral (valve) insufficiency: Secondary | ICD-10-CM | POA: Diagnosis not present

## 2020-12-27 DIAGNOSIS — I482 Chronic atrial fibrillation, unspecified: Secondary | ICD-10-CM

## 2020-12-27 NOTE — Patient Instructions (Addendum)
Medication Instructions:  Your physician recommends that you continue on your current medications as directed. Please refer to the Current Medication list given to you today.  *If you need a refill on your cardiac medications before your next appointment, please call your pharmacy*  Lab Work: Your physician recommends that you have lab work today.  If you have labs (blood work) drawn today and your tests are completely normal, you will receive your results only by: [place] (if you have MyChart) OR A paper copy in the mail If you have any lab test that is abnormal or we need to change your treatment, we will call you to review the results.  Testing/Procedures: None ordered today.  Follow-Up: At [facility], you and your health needs are our priority.  As part of our continuing mission to provide you with exceptional heart care, we have created designated Provider Care Teams.  These Care Teams include your primary Cardiologist (physician) and Advanced Practice Providers (APPs -  Physician Assistants and Nurse Practitioners) who all work together to provide you with the care you need, when you need it.  We recommend signing up for the patient portal called "MyChart".  Sign up information is provided on this After Visit Summary.  MyChart is used to connect with patients for Virtual Visits (Telemedicine).  Patients are able to view lab/test results, encounter notes, upcoming appointments, etc.  Non-urgent messages can be sent to your provider as well.   To learn more about what you can do with MyChart, go to [email or web address].    Your next appointment:   6 to 8 week(s)  The format for your next appointment:   In Person  Provider:   Jenkins Rouge, MD

## 2020-12-29 ENCOUNTER — Telehealth: Payer: Self-pay | Admitting: Cardiovascular Disease

## 2020-12-29 ENCOUNTER — Encounter (HOSPITAL_BASED_OUTPATIENT_CLINIC_OR_DEPARTMENT_OTHER): Payer: Self-pay | Admitting: Nurse Practitioner

## 2020-12-29 ENCOUNTER — Other Ambulatory Visit: Payer: Self-pay

## 2020-12-29 ENCOUNTER — Ambulatory Visit (INDEPENDENT_AMBULATORY_CARE_PROVIDER_SITE_OTHER): Payer: HMO | Admitting: Nurse Practitioner

## 2020-12-29 ENCOUNTER — Telehealth (HOSPITAL_BASED_OUTPATIENT_CLINIC_OR_DEPARTMENT_OTHER): Payer: Self-pay | Admitting: Nurse Practitioner

## 2020-12-29 VITALS — BP 120/88 | HR 54 | Resp 12 | Ht 72.0 in | Wt 159.0 lb

## 2020-12-29 DIAGNOSIS — J310 Chronic rhinitis: Secondary | ICD-10-CM

## 2020-12-29 DIAGNOSIS — I5042 Chronic combined systolic (congestive) and diastolic (congestive) heart failure: Secondary | ICD-10-CM

## 2020-12-29 DIAGNOSIS — K591 Functional diarrhea: Secondary | ICD-10-CM | POA: Diagnosis not present

## 2020-12-29 DIAGNOSIS — R634 Abnormal weight loss: Secondary | ICD-10-CM

## 2020-12-29 DIAGNOSIS — I4891 Unspecified atrial fibrillation: Secondary | ICD-10-CM | POA: Diagnosis not present

## 2020-12-29 DIAGNOSIS — R0602 Shortness of breath: Secondary | ICD-10-CM

## 2020-12-29 DIAGNOSIS — Z23 Encounter for immunization: Secondary | ICD-10-CM

## 2020-12-29 DIAGNOSIS — K529 Noninfective gastroenteritis and colitis, unspecified: Secondary | ICD-10-CM | POA: Diagnosis not present

## 2020-12-29 DIAGNOSIS — F320 Major depressive disorder, single episode, mild: Secondary | ICD-10-CM | POA: Diagnosis not present

## 2020-12-29 DIAGNOSIS — I25758 Atherosclerosis of native coronary artery of transplanted heart with other forms of angina pectoris: Secondary | ICD-10-CM | POA: Diagnosis not present

## 2020-12-29 DIAGNOSIS — Z0000 Encounter for general adult medical examination without abnormal findings: Secondary | ICD-10-CM

## 2020-12-29 DIAGNOSIS — R413 Other amnesia: Secondary | ICD-10-CM | POA: Diagnosis not present

## 2020-12-29 DIAGNOSIS — D689 Coagulation defect, unspecified: Secondary | ICD-10-CM

## 2020-12-29 HISTORY — DX: Shortness of breath: R06.02

## 2020-12-29 LAB — CBC WITH DIFFERENTIAL/PLATELET
Basophils Absolute: 0 10*3/uL (ref 0.0–0.2)
Basos: 0 %
EOS (ABSOLUTE): 0.1 10*3/uL (ref 0.0–0.4)
Eos: 1 %
Hematocrit: 44.1 % (ref 37.5–51.0)
Hemoglobin: 14.9 g/dL (ref 13.0–17.7)
Immature Grans (Abs): 0 10*3/uL (ref 0.0–0.1)
Immature Granulocytes: 0 %
Lymphocytes Absolute: 1.3 10*3/uL (ref 0.7–3.1)
Lymphs: 22 %
MCH: 30.4 pg (ref 26.6–33.0)
MCHC: 33.8 g/dL (ref 31.5–35.7)
MCV: 90 fL (ref 79–97)
Monocytes Absolute: 0.6 10*3/uL (ref 0.1–0.9)
Monocytes: 10 %
Neutrophils Absolute: 4.1 10*3/uL (ref 1.4–7.0)
Neutrophils: 67 %
Platelets: 133 10*3/uL — ABNORMAL LOW (ref 150–450)
RBC: 4.9 x10E6/uL (ref 4.14–5.80)
RDW: 14.3 % (ref 11.6–15.4)
WBC: 6.1 10*3/uL (ref 3.4–10.8)

## 2020-12-29 LAB — BASIC METABOLIC PANEL
BUN/Creatinine Ratio: 16 (ref 10–24)
BUN: 22 mg/dL (ref 8–27)
CO2: 31 mmol/L — ABNORMAL HIGH (ref 20–29)
Calcium: 9.2 mg/dL (ref 8.6–10.2)
Chloride: 99 mmol/L (ref 96–106)
Creatinine, Ser: 1.35 mg/dL — ABNORMAL HIGH (ref 0.76–1.27)
Glucose: 96 mg/dL (ref 70–99)
Potassium: 4.2 mmol/L (ref 3.5–5.2)
Sodium: 143 mmol/L (ref 134–144)
eGFR: 53 mL/min/{1.73_m2} — ABNORMAL LOW (ref >59)

## 2020-12-29 LAB — PRO B NATRIURETIC PEPTIDE: NT-Pro BNP: 3044 pg/mL — ABNORMAL HIGH (ref 0–486)

## 2020-12-29 MED ORDER — CHOLESTYRAMINE 4 G PO PACK: 4.0000 g | PACK | Freq: Three times a day (TID) | ORAL | 12 refills | Status: DC

## 2020-12-29 MED ORDER — MIRTAZAPINE 7.5 MG PO TABS: ORAL_TABLET | ORAL | 0 refills | Status: DC

## 2020-12-29 MED ORDER — ZOSTER VAC RECOMB ADJUVANTED 50 MCG/0.5ML IM SUSR: 0.5000 mL | Freq: Once | INTRAMUSCULAR | 1 refills | Status: AC

## 2020-12-29 NOTE — Telephone Encounter (Signed)
Pt called and wanted provider to know that the medication that was prescribed today 11/10 the pharmacist stated to them it lessons the effectiveness of his warfin medication. Pt just wanted a nurse to call them and see if it was okay to take. Please advise.

## 2020-12-29 NOTE — Telephone Encounter (Signed)
Pt c/o medication issue:  1. Name of Medication: cholestyramine (QUESTRAN) 4 g packet  2. How are you currently taking this medication (dosage and times per day)? Not taking yet  3. Are you having a reaction (difficulty breathing--STAT)? no  4. What is your medication issue? Patient would like to know if it is okay for his to start the medication. He says he is concerned, because it can decrease the efficiency of his warfarin. He would like a call back telling him whether her can take it or not. He says if he does not answer to just leave the answer on his voicemail.

## 2020-12-29 NOTE — Progress Notes (Signed)
Jason Render, DNP, AGNP-c [facility] Medicine [address]  [place], [place] [address] [phone]  New patient visit   Patient: Jason Day   DOB: [birth year]   80 y.o. Male  MRN: [id number] Visit Date: 12/29/2020  Patient Care Team: Jason Day, Jason Pesa, NP as PCP - General (Nurse Practitioner) Jason Hector, MD as PCP - Cardiology (Cardiology) Jason Epley, MD as PCP - Electrophysiology (Cardiology) Jason Silence, MD as Consulting Physician (Gastroenterology) Jason Britain, MD as Consulting Physician (Orthopedic Surgery) Jason Seal, MD as Attending Physician (Urology)  Today's healthcare provider: Orma Render, NP   Chief Complaint  Patient presents with   New Patient (Initial Visit)    Patient has had significant weight loss in the past 3 months. Been to previous [facility] doctors with no results. Everything he eats runs right through him.    Subjective    Jason Day is a 80 y.o. male who presents today as a new patient to establish care.  He is present with his wife, Jason Day, today. HPI  [name] and his wife report he has had a significant amount of weight loss over the last 3 months.   They endorse ongoing, chronic frequent bowel movements and abdominal Day with food ingestion.  Patient reports that he does not feel hungry and he does avoid eating at times due to increased frequency of bowel movements.   They report that he has been evaluated by 3 separate GI physicians and did have a hospitalization with upper endoscopy and colonoscopy which revealed no causes of symptoms. He was recently seen by cardiology who recommended that he see his PCP for recommendations of supplementation to help with weight loss. Cardiology did obtain some labs but they are unsure what has been tested and do not have the results back yet.  He also endorses concerns with chronic rhinorrhea and feelings of shortness of breath. The patient does endorse using  Afrin multiple times a day for several years due to chronic congestion and rhinorrhea. The patient and his wife report whenever he feels significantly congested he does get short of breath and they do feel that oxygen therapy would be beneficial for him to have at home with these instances. He has not had any evaluation by pulmonology in the past and has not been on any medications for breathing. He has had rhinoplasty in the past for cosmetic purposes.  They endorse concerns for heart murmur.  They report that previous evaluations with cardiology were limited on recommendations for treatment for this.  They do express concerns that they are being "brushed off" due to his age.  He does have a history of combined systolic and diastolic heart failure, ischemic cardiomyopathy, left atrial enlargement, moderate mitral regurgitation, persistent A. fib, right bundle branch block, MI.  They are requesting second opinion with cardiology today.  Patients wife also express concerns with memory impairment. She reports he has "some dementia".  Past Medical History:  Diagnosis Date   Bleeding hemorrhoids    Chronic combined systolic and diastolic heart failure (HCC)    Chronic sinus complaints    overuses afrin   Coronary atherosclerosis of native coronary artery    Dyspnea    Hyperlipidemia    Ischemic cardiomyopathy    Left atrial enlargement    Moderate mitral regurgitation    Myocardial infarction ([place]) 09/2015   Persistent atrial fibrillation (HCC)    RBBB    A- Fib   Past  Surgical History:  Procedure Laterality Date   BACK SURGERY  ~2014   disc repair   CARDIOVERSION N/A 10/24/2018   Procedure: CARDIOVERSION;  Surgeon: Jason Pain, MD;  Location: [facility] ENDOSCOPY;  Service: Cardiovascular;  Laterality: N/A;   CHOLECYSTECTOMY     COLONOSCOPY  04/06/2014   Hemorrhoids and diverticulosis performed in [place] N/A 08/04/2019   Procedure: COLONOSCOPY WITH PROPOFOL;   Surgeon: Jason Horner, MD;  Location: [facility] ENDOSCOPY;  Service: Endoscopy;  Laterality: N/A;   CORONARY ARTERY BYPASS GRAFT N/A 10/18/2016   Procedure: CORONARY ARTERY BYPASS GRAFTING (CABG) x five , using left internal mammary artery and right leg greater saphenous vein harvested endoscopically;  Surgeon: Jason Pollack, MD;  Location: [facility] OR;  Service: Open Heart Surgery;  Laterality: N/A;   ESOPHAGOGASTRODUODENOSCOPY (EGD) WITH PROPOFOL N/A 08/04/2019   Procedure: ESOPHAGOGASTRODUODENOSCOPY (EGD) WITH PROPOFOL;  Surgeon: Jason Horner, MD;  Location: [facility] ENDOSCOPY;  Service: Endoscopy;  Laterality: N/A;   FOOT SURGERY     HEMORRHOID BANDING     HEMORRHOID SURGERY     REVERSE SHOULDER ARTHROPLASTY Left 08/21/2018   Procedure: REVERSE SHOULDER ARTHROPLASTY;  Surgeon: Jason Britain, MD;  Location: [facility] ORS;  Service: Orthopedics;  Laterality: Left;   RIGHT/LEFT HEART CATH AND CORONARY ANGIOGRAPHY N/A 10/16/2016   Procedure: RIGHT/LEFT HEART CATH AND CORONARY ANGIOGRAPHY;  Surgeon: Jason Crome, MD;  Location: [facility] CV LAB;  Service: Cardiovascular;  Laterality: N/A;   stents in liver     TEE WITHOUT CARDIOVERSION N/A 10/18/2016   Procedure: TRANSESOPHAGEAL ECHOCARDIOGRAM (TEE);  Surgeon: Jason Pollack, MD;  Location: [facility];  Service: Open Heart Surgery;  Laterality: N/A;   TRANSURETHRAL RESECTION OF PROSTATE N/A 11/19/2019   Procedure: CYSTOSCOPY TRANSURETHRAL RESECTION OF THE PROSTATE (TURP);  Surgeon: Jason Seal, MD;  Location: [facility] ORS;  Service: Urology;  Laterality: N/A;   Family Status  Relation Name Status   Mother  Deceased at age 72       old age   Father  Deceased at age 5       cirrhosis   Brother  Alive   Neg Hx  (Not Specified)   Family History  Problem Relation Age of Onset   COPD Father    Emphysema Father    Healthy Brother    Diabetes Neg Hx    Cancer Neg Hx    Stomach cancer Neg Hx    Colon cancer Neg Hx    Social History   Socioeconomic History   Marital  status: Married    Spouse name: Not on file   Number of children: 2   Years of education: Not on file   Highest education level: Not on file  Occupational History   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  Tobacco Use   Smoking status: Former    Packs/day: 0.50    Types: Cigarettes    Quit date: 1970    Years since quitting: 52.8   Smokeless tobacco: Never   Tobacco comments:    Smoked on and off  Scientific laboratory technician Use: Never used  Substance and Sexual Activity   Alcohol use: No   Drug use: No   Sexual activity: Yes  Other Topics Concern   Not on file  Social History Narrative   Married    Originally from [place] moved to [place] age 64 moved to [place] 2018   1 son Dugan Vanhoesen in [place]  1dtr [name] in [place]       Has living will   Wife is health care POA--then son   Would accept resuscitation   No tube feeds if cognitively unaware   Social Determinants of Health   Financial Resource Strain: Not on file  Food Insecurity: Not on file  Transportation Needs: Not on file  Physical Activity: Not on file  Stress: Not on file  Social Connections: Not on file   Outpatient Medications Prior to Visit  Medication Sig   atorvastatin (LIPITOR) 20 MG tablet TAKE 1/2 TABLET BY MOUTH EVERY DAY   digoxin (LANOXIN) 0.125 MG tablet TAKE 1/2 TABLETS BY MOUTH EVERY DAY   ENTRESTO 49-51 MG TAKE 1 TABLET BY MOUTH TWICE A DAY   furosemide (LASIX) 40 MG tablet Take 1 tablet (40 mg total) by mouth daily.   metoprolol succinate (TOPROL-XL) 25 MG 24 hr tablet TAKE 1/2 TABLET BY MOUTH EVERY DAY   Saccharomyces boulardii (FLORASTOR PO) Take 1 tablet by mouth daily.   spironolactone (ALDACTONE) 25 MG tablet Take 1 tablet (25 mg total) by mouth daily.   warfarin (COUMADIN) 5 MG tablet TAKE 1/2 TABLET TO 1 TABLET BY MOUTH DAILY AS DIRECTED BY THE COUMADIN CLINIC.   [DISCONTINUED] Zoster Vaccine Adjuvanted [facility]) injection Inject 0.5 ml IM and Repeat  in 2 months   No facility-administered medications prior to visit.   No Known Allergies  Immunization History  Administered Date(s) Administered   Influenza, High Dose Seasonal PF 11/06/2016, 10/15/2018, 10/15/2018, 11/03/2019, 10/31/2020   Influenza-Unspecified 12/16/2020   PFIZER Comirnaty(Gray Top)Covid-19 Tri-Sucrose Vaccine 05/22/2020   PFIZER(Purple Top)SARS-COV-2 Vaccination 03/15/2019, 04/06/2019, 11/03/2019   PNEUMOCOCCAL CONJUGATE-20 12/29/2020   Zoster Recombinat (Shingrix) 04/19/2016, 06/22/2016    Health Maintenance  Topic Date Due   COVID-19 Vaccine (5 - Booster for [place] series) 07/17/2020   TETANUS/TDAP  10/21/2026   Pneumonia Vaccine 5+ Years old  Completed   INFLUENZA VACCINE  Completed   Zoster Vaccines- Shingrix  Completed   HPV VACCINES  Aged Out    Patient Care Team: Mikaelyn Arthurs, Jason Pesa, NP as PCP - General (Nurse Practitioner) Jason Hector, MD as PCP - Cardiology (Cardiology) Jason Epley, MD as PCP - Electrophysiology (Cardiology) Jason Silence, MD as Consulting Physician (Gastroenterology) Jason Britain, MD as Consulting Physician (Orthopedic Surgery) Jason Seal, MD as Attending Physician (Urology)  Review of Systems All review of systems negative except what is listed in the HPI    Objective    There were no vitals taken for this visit. Physical Exam Vitals and nursing note reviewed.  Constitutional:      Appearance: He is cachectic. He is ill-appearing.  HENT:     Head: Normocephalic.     Right Ear: Tympanic membrane normal.     Left Ear: Tympanic membrane normal.     Nose: Congestion and rhinorrhea present.  Eyes:     Extraocular Movements: Extraocular movements intact.     Conjunctiva/sclera: Conjunctivae normal.     Pupils: Pupils are equal, round, and reactive to light.  Neck:     Vascular: No carotid bruit.  Cardiovascular:     Rate and Rhythm: Normal rate. Rhythm irregular.     Pulses: Normal pulses.     Heart sounds:  Murmur heard.  Pulmonary:     Effort: Pulmonary effort is normal.     Breath sounds: Normal breath sounds.  Abdominal:     General: Bowel sounds are normal. There is no distension.  Palpations: Abdomen is soft. There is no mass.     Tenderness: There is abdominal tenderness. There is no right CVA tenderness, left CVA tenderness, guarding or rebound.     Hernia: No hernia is present.  Musculoskeletal:        General: Normal range of motion.     Cervical back: Normal range of motion. No tenderness.     Right lower leg: Edema present.     Left lower leg: Edema present.  Lymphadenopathy:     Cervical: No cervical adenopathy.  Skin:    General: Skin is warm and dry.     Capillary Refill: Capillary refill takes less than 2 seconds.  Neurological:     General: No focal deficit present.     Mental Status: He is alert.     Cranial Nerves: No cranial nerve deficit.     Sensory: No sensory deficit.     Motor: Weakness present.     Coordination: Coordination normal.     Gait: Gait normal.  Psychiatric:        Attention and Perception: Attention normal.        Mood and Affect: Mood is anxious. Affect is labile and angry.        Behavior: Behavior is agitated. Behavior is cooperative.        Cognition and Memory: He exhibits impaired recent memory.     Depression Screen PHQ 2/9 Scores 12/30/2020 08/31/2020 04/22/2017 03/20/2017  PHQ - 2 Score 0 1 0 0  PHQ- 9 Score 7 5 - -   Results for orders placed or performed in visit on 12/29/20  Thyroid Panel With TSH  Result Value Ref Range   TSH 2.690 0.450 - 4.500 uIU/mL   T4, Total 7.2 4.5 - 12.0 ug/dL   T3 Uptake Ratio 30 24 - 39 %   Free Thyroxine Index 2.2 1.2 - 4.9  VITAMIN D 25 Hydroxy (Vit-D Deficiency, Fractures)  Result Value Ref Range   Vit D, 25-Hydroxy 59.2 30.0 - 100.0 ng/mL  Hemoglobin A1c  Result Value Ref Range   Hgb A1c MFr Bld 5.9 (H) 4.8 - 5.6 %   Est. 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  Will follow      Relevant Medications   mirtazapine (REMERON) 7.5 MG tablet   cholestyramine (QUESTRAN) 4 g packet   Atrial fibrillation (HCC)    Chronic history of afib on chronic anticoagulation HR irregular today, but no rapid rate present.  Asymptomatic.  Followed by cardsJohnsie Cancel.       Relevant Medications   cholestyramine (QUESTRAN) 4 g packet   Chronic combined systolic and diastolic heart failure (HCC)    Chronic.  Mild edema present in LE.  Patient is taking diuretic daily at this time- which is new from PRN Exam stable with no signs of volume overload.  BNP pending from cardiology      Relevant Medications   cholestyramine (QUESTRAN) 4 g packet   Encounter for medical examination to establish care - Primary    Multiple chronic medical concerns present today.  Will evaluate the medical record to determine what has been evaluated and recommendations from speciality and previous providers in order to make  new recommendations. Considerable amount of time spent with concerns today and determining plan of care.  Patient will likely require multiple follow-ups to address issues present.       Diarrhea    Chronic diarrhea with intake of meals.  Patient has had cholecystectomy, which is a possible contributor to his symptoms.  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  There is concerns for weight loss with BMI of 21.97% today. He appears thin and malnourished on exam.  Will trial bile acid sequestrants to see if this is helpful for chronic loose stools.  Educated on use with meals to see if this  is helpful. May consider adding imodium to regimen to help slow gastric emptying.  We will plan to re-evaluate in the future.       Shortness of breath    Endorses shortness of breath with chronic rhinorrhea and congestion.  Patient is chronically using Afrin and this was discussed at length today.  Patient and wife are requesting oxygen therapy for home.  Discussed that this will require evaluation by pulmonology for recommendations as there is no apparent need for this on evaluation today and insurance will not approve without diagnosis and evaluation.  Will send pulmonology referral.  We may be able to get intermittent O2 approved for CHF diagnosis, but will defer to pulmonology at this time to ensure correct diagnosis and testing are in place prior to treating unknown possible causes of symptoms.  Patient and wife agreeable to plan.       Relevant Orders   Ambulatory referral to Pulmonology   Weight loss    Reported significant weight loss over past 3 months.  Unclear of exact weight that has been lost- will review record for further evaluation.  Patient reports decreased appetite and desire to eat due to chronic diarrhea.  Would like to address diarrhea concerns with bile acid sequestrant and improve appetite with remeron today. Recommend meal supplementation with high protein sources such as boost or ensure to help increase weight.  No GI deficit determine by work-up with GI. Recent labs with cardiology do not reveal suspected causes.  Will obtain thyroid, A1c, and Vitamin D testing today for further evaluation and make recommendations to plan of care based on lab and chart review.       Relevant Medications   mirtazapine (REMERON) 7.5 MG tablet   Other Relevant Orders   Thyroid Panel With TSH (Completed)   VITAMIN D 25 Hydroxy (Vit-D Deficiency, Fractures) (Completed)   Hemoglobin A1c (Completed)   Depression, major, single episode, mild ([place])    Patient exhibits positive PHQ  today with noted agitation and mood changes during visit.  Wife expressed concerns with depression symptoms and memory loss. It does not appear he has had formal evaluation for this. Will review the record to see if there have been previous evaluations and make recommendations for f/u as appropriate.  Will begin mirtazapine 7.5mg  today to help with mood and appetite. Plan to increase to 15mg  at 2 weeks.  Will f/u in 2 weeks to see how this medication is working and reassess mood.       Relevant Medications   mirtazapine (REMERON) 7.5 MG tablet   Memory impairment    Memory impairment reported by patient and spouse It is unclear if this has been evaluated in the past.  Will review medical record for evaluations and make recommendations on further changes to plan of care and referrals.  There is a component of depression present, which could be exacerbating his symptoms.  Will start remeron today to see if this helps with mood and determine if changes in his memory occur with improvement of depression symptoms.       Coagulation defect (HCC)    Chronic anticoagulation.  INR checks with cardiology on regular basis. Patient does endorse confusion on his "normal" levels with varying responses during visits for checks.  Recent labs with cardiology.  Will review the medical records to determine the recommended INR and report to patient.      Chronic rhinitis   Chronic diarrhea   Relevant Medications   cholestyramine (QUESTRAN) 4 g packet   Other Visit Diagnoses     Need for shingles vaccine       Vaccine for streptococcus pneumoniae and influenza       Relevant Orders   Pneumococcal conjugate vaccine 20-valent (Completed)        Return in about 4 weeks (around 01/26/2021) for weight loss- new meds.     Time: 75 minutes, >50% spent counseling, care coordination, chart review, and documentation.   Deniya Craigo, Jason Pesa, NP, DNP, AGNP-C [facility] at [facility]

## 2020-12-29 NOTE — Patient Instructions (Signed)
Thank you for choosing [place] at [facility] for your Primary Care needs. I am excited for the opportunity to partner with you to meet your health care goals. It was a pleasure meeting you today!  Recommendations from today's visit: We will try the bile acid medication, cholestyramine, to see if this helps with your bowels. You will take this with every meal you eat.  We will try Remeron at bedtime to see if this will help with your mood and with your with your appetite. You wont see this work right away, it may take a few weeks for total change, but you should notice a difference in about 2 weeks to some degree.    Information on diet, exercise, and health maintenance recommendations are listed below. This is information to help you be sure you are on track for optimal health and monitoring.   Please look over this and let us know if you have any questions or if you have completed any of the health maintenance outside of [place] so that we can be sure your records are up to date.  ___________________________________________________________ About Me: I am an Adult-Geriatric Nurse Practitioner with a background in caring for patients for more than 20 years with a strong intensive care background. I provide primary care and sports medicine services to patients age 65 and older within this office. My education had a strong focus on caring for the older adult population, which I am passionate about. I am also the director of the APP Fellowship with [facility].   My desire is to provide you with the best service through preventive medicine and supportive care. I consider you a part of the medical team and value your input. I work diligently to ensure that you are heard and your needs are met in a safe and effective manner. I want you to feel comfortable with me as your provider and want you to know that your health concerns are important to me.  For your information, our  office hours are: Monday, Tuesday, and Thursday 8:00 AM - 5:00 PM Wednesday and Friday 8:00 AM - 12:00 PM.   In my time away from the office I am teaching new APP's within the system and am unavailable, but my partner, Dr. Burnard Bunting is in the office for emergent needs.   If you have questions or concerns, please call our office at [phone] or send [place] a MyChart message and we will respond as quickly as possible.  ____________________________________________________________ MyChart:  For all urgent or time sensitive needs we ask that you please call the office to avoid delays. Our number is [phone]. MyChart is not constantly monitored and due to the large volume of messages a day, replies may take up to 72 business hours.  MyChart Policy: MyChart allows for you to see your visit notes, after visit summary, provider recommendations, lab and tests results, make an appointment, request refills, and contact your provider or the office for non-urgent questions or concerns. Providers are seeing patients during normal business hours and do not have built in time to review MyChart messages.  We ask that you allow a minimum of 3 business days for responses to [employer]. For this reason, please do not send urgent requests through [place]. Please call the office at [phone]. New and ongoing conditions may require a visit. We have virtual and in person visit available for your convenience.  Complex MyChart concerns may require a visit. Your provider may request you  schedule a virtual or in person visit to ensure we are providing the best care possible. MyChart messages sent after 11:00 AM on Friday will not be received by the provider until Monday morning.    Lab and Test Results: You will receive your lab and test results on MyChart as soon as they are completed and results have been sent by the lab or testing facility. Due to this service, you will receive your results BEFORE your  provider.  I review lab and tests results each morning prior to seeing patients. Some results require collaboration with other providers to ensure you are receiving the most appropriate care. For this reason, we ask that you please allow a minimum of 3-5 business days from the time the ALL results have been received for your provider to receive and review lab and test results and contact you about these.  Most lab and test result comments from the provider will be sent through [place]. Your provider may recommend changes to the plan of care, follow-up visits, repeat testing, ask questions, or request an office visit to discuss these results. You may reply directly to this message or call the office at [phone] to provide information for the provider or set up an appointment. In some instances, you will be called with test results and recommendations. Please let us know if this is preferred and we will make note of this in your chart to provide this for you.    If you have not heard a response to your lab or test results in 5 business days from all results returning to [place], please call the office to let us know. We ask that you please avoid calling prior to this time unless there is an emergent concern. Due to high call volumes, this can delay the resulting process.  After Hours: For all non-emergency after hours needs, please call the office at [phone] and select the option to reach the on-call provider service. On-call services are shared between multiple [place] offices and therefore it will not be possible to speak directly with your provider. On-call providers may provide medical advice and recommendations, but are unable to provide refills for maintenance medications.  For all emergency or urgent medical needs after normal business hours, we recommend that you seek care at the closest Urgent Care or Emergency Department to ensure appropriate treatment in a timely manner.  [facility] [place] has a 24 hour emergency room located on the ground floor for your convenience.   Urgent Concerns During the Business Day Providers are seeing patients from 8AM to [place] with a busy schedule and are most often not able to respond to non-urgent calls until the end of the day or the next business day. If you should have URGENT concerns during the day, please call and speak to the nurse or schedule a same day appointment so that we can address your concern without delay.   Thank you, again, for choosing me as your health care partner. I appreciate your trust and look forward to learning more about you.   Worthy Keeler, DNP, AGNP-c ___________________________________________________________  Health Maintenance Recommendations Screening Testing Mammogram Every 1 -2 years based on history and risk factors Starting at age 24 Pap Smear Ages 21-39 every 3 years Ages 20-65 every 5 years with HPV testing More frequent testing may be required based on results and history Colon Cancer Screening Every 1-10 years based on test performed, risk factors, and history Starting at age 68 Bone  Density Screening Every 2-10 years based on history Starting at age 14 for women Recommendations for men differ based on medication usage, history, and risk factors AAA Screening One time ultrasound Men 58-89 years old who have every smoked Lung Cancer Screening Low Dose Lung CT every 12 months Age 34-80 years with a 30 pack-year smoking history who still smoke or who have quit within the last 15 years  Screening Labs Routine  Labs: Complete Blood Count (CBC), Complete Metabolic Panel (CMP), Cholesterol (Lipid Panel) Every 6-12 months based on history and medications May be recommended more frequently based on current conditions or previous results Hemoglobin A1c Lab Every 3-12 months based on history and previous results Starting at age 20 or earlier with diagnosis of diabetes, high  cholesterol, BMI >26, and/or risk factors Frequent monitoring for patients with diabetes to ensure blood sugar control Thyroid Panel (TSH w/ T3 & T4) Every 6 months based on history, symptoms, and risk factors May be repeated more often if on medication HIV One time testing for all patients 34 and older May be repeated more frequently for patients with increased risk factors or exposure Hepatitis C One time testing for all patients 57 and older May be repeated more frequently for patients with increased risk factors or exposure Gonorrhea, Chlamydia Every 12 months for all sexually active persons 13-24 years Additional monitoring may be recommended for those who are considered high risk or who have symptoms PSA Men 20-64 years old with risk factors Additional screening may be recommended from age 29-69 based on risk factors, symptoms, and history  Vaccine Recommendations Tetanus Booster All adults every 10 years Flu Vaccine All patients 6 months and older every year COVID Vaccine All patients 12 years and older Initial dosing with booster May recommend additional booster based on age and health history HPV Vaccine 2 doses all patients age 68-26 Dosing may be considered for patients over 26 Shingles Vaccine (Shingrix) 2 doses all adults 69 years and older Pneumonia (Pneumovax 23) All adults 37 years and older May recommend earlier dosing based on health history Pneumonia (Prevnar 35) All adults 18 years and older Dosed 1 year after Pneumovax 23  Additional Screening, Testing, and Vaccinations may be recommended on an individualized basis based on family history, health history, risk factors, and/or exposure.  __________________________________________________________  Diet Recommendations for All Patients  I recommend that all patients maintain a diet low in saturated fats, carbohydrates, and cholesterol. While this can be challenging at first, it is not impossible and small  changes can make big differences.  Things to try: Decreasing the amount of soda, sweet tea, and/or juice to one or less per day and replace with water While water is always the first choice, if you do not like water you may consider adding a water additive without sugar to improve the taste other sugar free drinks Replace potatoes with a brightly colored vegetable at dinner Use healthy oils, such as canola oil or olive oil, instead of butter or hard margarine Limit your bread intake to two pieces or less a day Replace regular pasta with low carb pasta options Bake, broil, or grill foods instead of frying Monitor portion sizes  Eat smaller, more frequent meals throughout the day instead of large meals  An important thing to remember is, if you love foods that are not great for your health, you don't have to give them up completely. Instead, allow these foods to be a reward when you have done well. Allowing yourself  to still have special treats every once in a while is a nice way to tell yourself thank you for working hard to keep yourself healthy.   Also remember that every day is a new day. If you have a bad day and "fall off the wagon", you can still climb right back up and keep moving along on your journey!  We have resources available to help you!  Some websites that may be helpful include: www.http://carter.biz/  Www.VeryWellFit.com _____________________________________________________________  Activity Recommendations for All Patients  I recommend that all adults get at least 20 minutes of moderate physical activity that elevates your heart rate at least 5 days out of the week.  Some examples include: Walking or jogging at a pace that allows you to carry on a conversation Cycling (stationary bike or outdoors) Water aerobics Yoga Weight lifting Dancing If physical limitations prevent you from putting stress on your joints, exercise in a pool or seated in a chair are excellent  options.  Do determine your MAXIMUM heart rate for activity: YOUR AGE - 220 = MAX HeartRate   Remember! Do not push yourself too hard.  Start slowly and build up your pace, speed, weight, time in exercise, etc.  Allow your body to rest between exercise and get good sleep. You will need more water than normal when you are exerting yourself. Do not wait until you are thirsty to drink. Drink with a purpose of getting in at least 8, 8 ounce glasses of water a day plus more depending on how much you exercise and sweat.    If you begin to develop dizziness, chest pain, abdominal pain, jaw pain, shortness of breath, headache, vision changes, lightheadedness, or other concerning symptoms, stop the activity and allow your body to rest. If your symptoms are severe, seek emergency evaluation immediately. If your symptoms are concerning, but not severe, please let us know so that we can recommend further evaluation.

## 2020-12-29 NOTE — Telephone Encounter (Signed)
Left VM on pt machine per his request- advised him to make sure he separates his warfarin and cholestyramine by at least 4 hours. Advised its very important that he keeps his apt with the coumadin clinic on 11/16 so that we can adjust his dose if needed.

## 2020-12-30 ENCOUNTER — Encounter (HOSPITAL_BASED_OUTPATIENT_CLINIC_OR_DEPARTMENT_OTHER): Payer: Self-pay | Admitting: Nurse Practitioner

## 2020-12-30 ENCOUNTER — Telehealth: Payer: Self-pay

## 2020-12-30 DIAGNOSIS — Z7901 Long term (current) use of anticoagulants: Secondary | ICD-10-CM | POA: Insufficient documentation

## 2020-12-30 DIAGNOSIS — R413 Other amnesia: Secondary | ICD-10-CM | POA: Insufficient documentation

## 2020-12-30 DIAGNOSIS — R32 Unspecified urinary incontinence: Secondary | ICD-10-CM

## 2020-12-30 DIAGNOSIS — D689 Coagulation defect, unspecified: Secondary | ICD-10-CM | POA: Insufficient documentation

## 2020-12-30 DIAGNOSIS — I5042 Chronic combined systolic (congestive) and diastolic (congestive) heart failure: Secondary | ICD-10-CM

## 2020-12-30 DIAGNOSIS — R159 Full incontinence of feces: Secondary | ICD-10-CM | POA: Insufficient documentation

## 2020-12-30 HISTORY — DX: Long term (current) use of anticoagulants: Z79.01

## 2020-12-30 HISTORY — DX: Unspecified urinary incontinence: R32

## 2020-12-30 LAB — HEMOGLOBIN A1C
Est. average glucose Bld gHb Est-mCnc: 123 mg/dL
Hgb A1c MFr Bld: 5.9 % — ABNORMAL HIGH (ref 4.8–5.6)

## 2020-12-30 LAB — VITAMIN D 25 HYDROXY (VIT D DEFICIENCY, FRACTURES): Vit D, 25-Hydroxy: 59.2 ng/mL (ref 30.0–100.0)

## 2020-12-30 LAB — THYROID PANEL WITH TSH
Free Thyroxine Index: 2.2 (ref 1.2–4.9)
T3 Uptake Ratio: 30 % (ref 24–39)
T4, Total: 7.2 ug/dL (ref 4.5–12.0)
TSH: 2.69 u[IU]/mL (ref 0.450–4.500)

## 2020-12-30 NOTE — Assessment & Plan Note (Signed)
Reported significant weight loss over past 3 months.  Unclear of exact weight that has been lost- will review record for further evaluation.  Patient reports decreased appetite and desire to eat due to chronic diarrhea.  Would like to address diarrhea concerns with bile acid sequestrant and improve appetite with remeron today. Recommend meal supplementation with high protein sources such as boost or ensure to help increase weight.  No GI deficit determine by work-up with GI. Recent labs with cardiology do not reveal suspected causes.  Will obtain thyroid, A1c, and Vitamin D testing today for further evaluation and make recommendations to plan of care based on lab and chart review.

## 2020-12-30 NOTE — Assessment & Plan Note (Addendum)
Chronic.  Mild edema present in LE.  Patient is taking diuretic daily at this time- which is new from PRN Exam stable with no signs of volume overload.  BNP pending from cardiology

## 2020-12-30 NOTE — Assessment & Plan Note (Signed)
No acute s/s present today On current therapy recommended by cardiology.  Will follow

## 2020-12-30 NOTE — Assessment & Plan Note (Signed)
Patient exhibits positive PHQ today with noted agitation and mood changes during visit.  Wife expressed concerns with depression symptoms and memory loss. It does not appear he has had formal evaluation for this. Will review the record to see if there have been previous evaluations and make recommendations for f/u as appropriate.  Will begin mirtazapine 7.5mg  today to help with mood and appetite. Plan to increase to 15mg  at 2 weeks.  Will f/u in 2 weeks to see how this medication is working and reassess mood.

## 2020-12-30 NOTE — Assessment & Plan Note (Signed)
Chronic history of afib on chronic anticoagulation HR irregular today, but no rapid rate present.  Asymptomatic.  Followed by cardsJohnsie Cancel.

## 2020-12-30 NOTE — Assessment & Plan Note (Signed)
Chronic diarrhea with intake of meals.  Patient has had cholecystectomy, which is a possible contributor to his symptoms.  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  There is concerns for weight loss with BMI of 21.97% today. He appears thin and malnourished on exam.  Will trial bile acid sequestrants to see if this is helpful for chronic loose stools.  Educated on use with meals to see if this is helpful. May consider adding imodium to regimen to help slow gastric emptying.  We will plan to re-evaluate in the future.

## 2020-12-30 NOTE — Telephone Encounter (Signed)
-----   Message from Josue Hector, MD sent at 12/29/2020  4:51 PM EST ----- BNP up as is CR continue diuretics refer to CHF clinic  We will look at MR in structural

## 2020-12-30 NOTE — Telephone Encounter (Signed)
The patient has been notified of the result and verbalized understanding.  All questions (if any) were answered. Michaelyn Barter, RN 12/30/2020 5:11 PM

## 2020-12-30 NOTE — Assessment & Plan Note (Signed)
Chronic anticoagulation.  INR checks with cardiology on regular basis. Patient does endorse confusion on his "normal" levels with varying responses during visits for checks.  Recent labs with cardiology.  Will review the medical records to determine the recommended INR and report to patient.

## 2020-12-30 NOTE — Assessment & Plan Note (Signed)
Memory impairment reported by patient and spouse It is unclear if this has been evaluated in the past.  Will review medical record for evaluations and make recommendations on further changes to plan of care and referrals.  There is a component of depression present, which could be exacerbating his symptoms.  Will start remeron today to see if this helps with mood and determine if changes in his memory occur with improvement of depression symptoms.

## 2020-12-30 NOTE — Assessment & Plan Note (Signed)
Multiple chronic medical concerns present today.  Will evaluate the medical record to determine what has been evaluated and recommendations from speciality and previous providers in order to make new recommendations. Considerable amount of time spent with concerns today and determining plan of care.  Patient will likely require multiple follow-ups to address issues present.

## 2020-12-30 NOTE — Assessment & Plan Note (Signed)
Endorses shortness of breath with chronic rhinorrhea and congestion.  Patient is chronically using Afrin and this was discussed at length today.  Patient and wife are requesting oxygen therapy for home.  Discussed that this will require evaluation by pulmonology for recommendations as there is no apparent need for this on evaluation today and insurance will not approve without diagnosis and evaluation.  Will send pulmonology referral.  We may be able to get intermittent O2 approved for CHF diagnosis, but will defer to pulmonology at this time to ensure correct diagnosis and testing are in place prior to treating unknown possible causes of symptoms.  Patient and wife agreeable to plan.

## 2021-01-01 ENCOUNTER — Other Ambulatory Visit: Payer: Self-pay | Admitting: Cardiovascular Disease

## 2021-01-02 ENCOUNTER — Encounter (HOSPITAL_BASED_OUTPATIENT_CLINIC_OR_DEPARTMENT_OTHER): Payer: Self-pay | Admitting: Nurse Practitioner

## 2021-01-02 ENCOUNTER — Other Ambulatory Visit (HOSPITAL_BASED_OUTPATIENT_CLINIC_OR_DEPARTMENT_OTHER): Payer: Self-pay | Admitting: Nurse Practitioner

## 2021-01-02 DIAGNOSIS — D689 Coagulation defect, unspecified: Secondary | ICD-10-CM

## 2021-01-02 DIAGNOSIS — Z7901 Long term (current) use of anticoagulants: Secondary | ICD-10-CM

## 2021-01-02 NOTE — Progress Notes (Signed)
New start mirtazapine for mood and weight loss.  Starting at 7.5mg . Will need INR monitoring in 1 week from start to adjust warfarin dosing, if needed.  Orders placed for INR monitoring.  Patient may come to the office or make appt with coumadin clinic to have this done. Recommend coumadin clinic as they have historically monitored and adjusted dosing.

## 2021-01-16 ENCOUNTER — Other Ambulatory Visit: Payer: Self-pay

## 2021-01-16 ENCOUNTER — Ambulatory Visit (INDEPENDENT_AMBULATORY_CARE_PROVIDER_SITE_OTHER): Payer: HMO | Admitting: *Deleted

## 2021-01-16 DIAGNOSIS — Z5181 Encounter for therapeutic drug level monitoring: Secondary | ICD-10-CM

## 2021-01-16 DIAGNOSIS — Z951 Presence of aortocoronary bypass graft: Secondary | ICD-10-CM

## 2021-01-16 LAB — POCT INR: INR: 1.5 — AB (ref 2.0–3.0)

## 2021-01-16 NOTE — Patient Instructions (Signed)
Description   Take 1.5 tablets today and then  START taking warfarin 1 tablet daily. Recheck INR in 4 weeks. (Per pt request, aware of risk). Call coumadin clinic for any changes in medications or upcoming procedures. [phone]

## 2021-01-23 ENCOUNTER — Other Ambulatory Visit: Payer: Self-pay | Admitting: Cardiovascular Disease

## 2021-01-26 ENCOUNTER — Encounter (HOSPITAL_BASED_OUTPATIENT_CLINIC_OR_DEPARTMENT_OTHER): Payer: Self-pay | Admitting: Nurse Practitioner

## 2021-01-26 ENCOUNTER — Ambulatory Visit (INDEPENDENT_AMBULATORY_CARE_PROVIDER_SITE_OTHER): Payer: HMO | Admitting: Nurse Practitioner

## 2021-01-26 ENCOUNTER — Other Ambulatory Visit: Payer: Self-pay

## 2021-01-26 VITALS — BP 120/82 | HR 97 | Ht 76.0 in | Wt 157.0 lb

## 2021-01-26 DIAGNOSIS — J310 Chronic rhinitis: Secondary | ICD-10-CM | POA: Diagnosis not present

## 2021-01-26 DIAGNOSIS — R634 Abnormal weight loss: Secondary | ICD-10-CM | POA: Diagnosis not present

## 2021-01-26 DIAGNOSIS — K529 Noninfective gastroenteritis and colitis, unspecified: Secondary | ICD-10-CM | POA: Diagnosis not present

## 2021-01-26 DIAGNOSIS — D689 Coagulation defect, unspecified: Secondary | ICD-10-CM

## 2021-01-26 DIAGNOSIS — F320 Major depressive disorder, single episode, mild: Secondary | ICD-10-CM

## 2021-01-26 MED ORDER — MIRTAZAPINE 15 MG PO TABS: 15.0000 mg | ORAL_TABLET | Freq: Every day | ORAL | 3 refills | Status: DC

## 2021-01-26 MED ORDER — CHOLESTYRAMINE 4 G PO PACK: 4.0000 g | PACK | Freq: Three times a day (TID) | ORAL | 12 refills | Status: DC

## 2021-01-26 MED ORDER — IPRATROPIUM BROMIDE 0.03 % NA SOLN: 2.0000 | Freq: Two times a day (BID) | NASAL | 6 refills | Status: DC

## 2021-01-26 NOTE — Assessment & Plan Note (Signed)
INR monitoring today.  Goal 2-3

## 2021-01-26 NOTE — Assessment & Plan Note (Signed)
Patient endorses decreased diarrhea and increased appetite, however, weight loss is still evident today. Remeron started at last visit and patient reports he feels like this is helping.  Encouraged patient to increase his intake with supplements containing high protein and avoid skipping meals. The weight loss is concerning. I will review his labs and studies from previous providers and make recommendations on any additional testing that may help eliminate or identify possible cause.

## 2021-01-26 NOTE — Patient Instructions (Addendum)
I want yo to continue to use the medication to help with your appetite and diarrhea. I have sent these to the pharmacy.  The mirtazapine is now a 15mg  pill so you just need to take one a day instead of 2 of the 7.5mg  pills.  I have sent a nasal spray for you to try. It is two sprays in each nostril twice a day. If this doesn't work, we may want to have the ear nose and throat doctor take a look to see if they can find a reason for the runny nose.   I am going to look over your labs and will let you know if I see anything that could indicate possible cancer. I will review the records from the urologist and GI doctor also.   Keep drinking a high protein drink at least once a day to help with your weight gain.

## 2021-01-26 NOTE — Assessment & Plan Note (Signed)
Will trial ipratropium to intranasal to see if this is helpful for symptom management.  Discussed with patient and wife that this could be a non-controllable condition related to his body.  If this is not effective, I do recommend that he have evaluation with ENT for possible determination of etiology.

## 2021-01-26 NOTE — Assessment & Plan Note (Signed)
Patient reportedly doing well on remeron.  Will continue current dose at this time.  May need to increase based on his weight at next visit.  Monitor for mood changes.

## 2021-01-26 NOTE — Assessment & Plan Note (Signed)
Significant improvement with normal BM noted once per day since starting bile salts.  Recommend that he continue this to help with symptom management.  Discussed that we will need to monitor his INR and adjust the medication appropriately while taking this medication.  He understands to take the medication at least 3.5-4 hours between coumadin dosing.  We will continue to monitor.

## 2021-01-26 NOTE — Progress Notes (Signed)
Established Patient Office Visit  Subjective:  Patient ID: Jason Day, male    DOB: 13-Aug-1940  Age: 80 y.o. MRN: [id number]  CC:  Chief Complaint  Patient presents with   Follow-up    Patient is here for follow up. He would like a PT/INR. He feels that the medication is working. Wants to know how long he will need to stay on it. He still have the runny nose. He would like to know if there is a test you can run to see if he has cancer.     HPI Jason Day presents for follow-up for unintentional weight loss and new start of mirtazapine. At his initial visit we discussed concerns with mood fluctuations and weight loss that had been occurring for about 3 months.  The patient reported decreased appetite and chronic frequent BM's that contributed to his desire to avoid foods at times.  He had a GI work-up for the increased BM and was diagnosed with IBS.  He was started on mirtazapine and bile acid sequestrants for management of weight loss and chronic diarrhea.  He is on coumadin for chronic Afib and he has been followed with cardiology for management of this since starting the mirtazapine with appropriate dosage adjustments.  We also discussed addition of high protein supplementation in between meals with something like Boost or Ensure to increase caloric intake and protein.   Today he tells me he feels he has gained some weight and has a better appetite He is only experiencing one bowel movement a day at this time and feels the bile acid sequestrants are working very well for him.  His wife reports that he is not drinking the ensure daily. Initial weight: 159 lb  with BMI 21.97% Weight today: 157 lb with BMI 19.11% He is not having side effects from the medications.   He would like to have his INR checked today as he is concerned with the levels given the possible interaction with the bile salts.  He has not had any bleeding, dark stools, dark urine, or easy bruising.   He endorses that he  is very concerned with chronic rhinorrhea and the constant clear dripping from his nose. He has cut down on his Afrin use to only once per day, but symptoms are continuous. He has tried multiple intranasal steroids and is unsure what next steps to take.   He endorses concerns over the weight loss and possibility of cancer. He would like to know what tests could be run to check for this.  He has had a prostate evaluation with urology in the recent past. He has had GI evaluation in the recent past. He has recent lab work completed. He has no fever or chills. No new wounds. No changes to skin.    Past Medical History:  Diagnosis Date   Bleeding hemorrhoids    Chronic combined systolic and diastolic heart failure (HCC)    Chronic sinus complaints    overuses afrin   Coronary atherosclerosis of native coronary artery    Dyspnea    Hyperlipidemia    Ischemic cardiomyopathy    Left atrial enlargement    Moderate mitral regurgitation    Myocardial infarction ([place]) 09/2015   Persistent atrial fibrillation (HCC)    RBBB    A- Fib    Past Surgical History:  Procedure Laterality Date   BACK SURGERY  ~2014   disc repair   CARDIOVERSION N/A 10/24/2018   Procedure: CARDIOVERSION;  Surgeon: Jerline Pain,  MD;  Location: [facility];  Service: Cardiovascular;  Laterality: N/A;   CHOLECYSTECTOMY     COLONOSCOPY  04/06/2014   Hemorrhoids and diverticulosis performed in [place] N/A 08/04/2019   Procedure: COLONOSCOPY WITH PROPOFOL;  Surgeon: Wonda Horner, MD;  [facility];  Service: Endoscopy;  Laterality: N/A;   CORONARY ARTERY BYPASS GRAFT N/A 10/18/2016   Procedure: CORONARY ARTERY BYPASS GRAFTING (CABG) x five , using left internal mammary artery and right leg greater saphenous vein harvested endoscopically;  Surgeon: Gaye Pollack, MD;  Location: [facility];  Service: Open Heart Surgery;  Laterality: N/A;   ESOPHAGOGASTRODUODENOSCOPY (EGD) WITH PROPOFOL N/A  08/04/2019   Procedure: ESOPHAGOGASTRODUODENOSCOPY (EGD) WITH PROPOFOL;  Surgeon: Wonda Horner, MD;  [facility];  Service: Endoscopy;  Laterality: N/A;   FOOT SURGERY     HEMORRHOID BANDING     HEMORRHOID SURGERY     REVERSE SHOULDER ARTHROPLASTY Left 08/21/2018   Procedure: REVERSE SHOULDER ARTHROPLASTY;  Surgeon: Justice Britain, MD;  [facility];  Service: Orthopedics;  Laterality: Left;   RIGHT/LEFT HEART CATH AND CORONARY ANGIOGRAPHY N/A 10/16/2016   Procedure: RIGHT/LEFT HEART CATH AND CORONARY ANGIOGRAPHY;  Surgeon: Belva Crome, MD;  Location: [facility];  Service: Cardiovascular;  Laterality: N/A;   stents in liver     TEE WITHOUT CARDIOVERSION N/A 10/18/2016   Procedure: TRANSESOPHAGEAL ECHOCARDIOGRAM (TEE);  Surgeon: Gaye Pollack, MD;  Location: [facility];  Service: Open Heart Surgery;  Laterality: N/A;   TRANSURETHRAL RESECTION OF PROSTATE N/A 11/19/2019   Procedure: CYSTOSCOPY TRANSURETHRAL RESECTION OF THE PROSTATE (TURP);  Surgeon: [name], MD;  [facility];  Service: Urology;  Laterality: N/A;    Family History  Problem Relation Age of Onset   COPD Father    Emphysema Father    Healthy Brother    Diabetes Neg Hx    Cancer Neg Hx    Stomach cancer Neg Hx    Colon cancer Neg Hx     Social History   Socioeconomic History   Marital status: Married    Spouse name: Not on file   Number of children: 2   Years of education: Not on file   Highest education level: Not on file  Occupational History   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  Tobacco Use   Smoking status: Former    Packs/day: 0.50    Types: Cigarettes    Quit date: 1970    Years since quitting: 52.9   Smokeless tobacco: Never   Tobacco comments:    Smoked on and off  Scientific laboratory technician Use: Never used  Substance and Sexual Activity   Alcohol use: No   Drug use: No   Sexual activity: Yes  Other Topics Concern   Not on file  Social History  Narrative   Married    Originally from [place] moved to [place] age 28 moved to [place]   1 son Heath Tesler in [place]   1dtr [place] in [place]       Has living will   Wife is health care POA--then son   Would accept resuscitation   No tube feeds if cognitively unaware   Social Determinants of Health   Financial Resource Strain: Not on file  Food Insecurity: Not on file  Transportation Needs: Not on file  Physical Activity: Not on file  Stress: Not on file  Social Connections: Not on  file  Intimate Partner Violence: Not on file    Outpatient Medications Prior to Visit  Medication Sig Dispense Refill   atorvastatin (LIPITOR) 20 MG tablet TAKE 1/2 TABLET BY MOUTH EVERY DAY 45 tablet 3   digoxin (LANOXIN) 0.125 MG tablet TAKE 1/2 TABLETS BY MOUTH EVERY DAY 45 tablet 3   ENTRESTO 49-51 MG TAKE 1 TABLET BY MOUTH TWICE A DAY 180 tablet 1   furosemide (LASIX) 40 MG tablet Take 1 tablet (40 mg total) by mouth daily. 90 tablet 3   metoprolol succinate (TOPROL-XL) 25 MG 24 hr tablet TAKE 1/2 TABLET BY MOUTH EVERY DAY 45 tablet 3   Saccharomyces boulardii (FLORASTOR PO) Take 1 tablet by mouth daily.     spironolactone (ALDACTONE) 25 MG tablet TAKE 1 TABLET (25 MG TOTAL) BY MOUTH DAILY. 90 tablet 2   warfarin (COUMADIN) 5 MG tablet TAKE 1/2 TABLET TO 1 TABLET BY MOUTH DAILY AS DIRECTED BY THE COUMADIN CLINIC. 35 tablet 1   cholestyramine (QUESTRAN) 4 g packet Take 1 packet (4 g total) by mouth 3 (three) times daily with meals. 60 each 12   mirtazapine (REMERON) 7.5 MG tablet Take 1 tablet (7.5 mg total) by mouth at bedtime for 14 days, THEN 2 tablets (15 mg total) at bedtime for 16 days. 60 tablet 0   No facility-administered medications prior to visit.    No Known Allergies  ROS Review of Systems All review of systems negative except what is listed in the HPI    Objective:    Physical Exam Vitals and nursing note reviewed.  Constitutional:      Appearance:  Normal appearance.  HENT:     Head: Normocephalic.     Right Ear: Tympanic membrane normal.     Left Ear: Tympanic membrane normal.     Nose: Rhinorrhea present. No congestion.     Mouth/Throat:     Mouth: Mucous membranes are moist.     Pharynx: Oropharynx is clear.  Eyes:     Extraocular Movements: Extraocular movements intact.     Conjunctiva/sclera: Conjunctivae normal.     Pupils: Pupils are equal, round, and reactive to light.  Neck:     Vascular: No carotid bruit.  Cardiovascular:     Rate and Rhythm: Normal rate. Rhythm irregular.     Pulses: Normal pulses.     Heart sounds: Murmur heard.  Pulmonary:     Effort: Pulmonary effort is normal.     Breath sounds: No wheezing.  Abdominal:     General: Bowel sounds are normal. There is no distension.     Palpations: Abdomen is soft.     Tenderness: There is no abdominal tenderness. There is no right CVA tenderness, left CVA tenderness or guarding.  Musculoskeletal:        General: Normal range of motion.     Cervical back: No tenderness.     Right lower leg: No edema.     Left lower leg: No edema.  Lymphadenopathy:     Cervical: No cervical adenopathy.  Skin:    General: Skin is warm and dry.     Capillary Refill: Capillary refill takes less than 2 seconds.     Findings: No bruising.  Neurological:     General: No focal deficit present.     Mental Status: He is alert and oriented to person, place, and time.     Motor: Weakness present.     Coordination: Coordination normal.  Psychiatric:  Mood and Affect: Mood normal.        Behavior: Behavior normal.        Thought Content: Thought content normal.        Judgment: Judgment normal.    BP 120/82   Pulse 97   Ht 6' 4" (1.93 m)   Wt 157 lb (71.2 kg)   SpO2 (!) 86%   BMI 19.11 kg/m  Wt Readings from Last 3 Encounters:  01/26/21 157 lb (71.2 kg)  12/29/20 159 lb (72.1 kg)  12/27/20 162 lb (73.5 kg)     Health Maintenance Due  Topic Date Due    COVID-19 Vaccine (5 - Booster for Pfizer series) 07/17/2020    There are no preventive care reminders to display for this patient.  Lab Results  Component Value Date   TSH 2.690 12/29/2020   Lab Results  Component Value Date   WBC 6.1 12/27/2020   HGB 14.9 12/27/2020   HCT 44.1 12/27/2020   MCV 90 12/27/2020   PLT 133 (L) 12/27/2020   Lab Results  Component Value Date   NA 143 12/27/2020   K 4.2 12/27/2020   CO2 31 (H) 12/27/2020   GLUCOSE 96 12/27/2020   BUN 22 12/27/2020   CREATININE 1.35 (H) 12/27/2020   BILITOT 3.8 (H) 08/31/2020   ALKPHOS 95 08/31/2020   AST 17 08/31/2020   ALT 11 08/31/2020   PROT 6.3 08/31/2020   ALBUMIN 4.5 08/31/2020   CALCIUM 9.2 12/27/2020   ANIONGAP 11 11/10/2019   EGFR 53 (L) 12/27/2020   GFR 46.18 (L) 04/22/2017   Lab Results  Component Value Date   CHOL 96 (L) 08/31/2020   Lab Results  Component Value Date   HDL 41 08/31/2020   Lab Results  Component Value Date   LDLCALC 42 08/31/2020   Lab Results  Component Value Date   TRIG 53 08/31/2020   Lab Results  Component Value Date   CHOLHDL 2.3 08/31/2020   Lab Results  Component Value Date   HGBA1C 5.9 (H) 12/29/2020      Assessment & Plan:   Problem List Items Addressed This Visit     Chronic rhinitis    Will trial ipratropium to intranasal to see if this is helpful for symptom management.  Discussed with patient and wife that this could be a non-controllable condition related to his body.  If this is not effective, I do recommend that he have evaluation with ENT for possible determination of etiology.       Relevant Medications   ipratropium (ATROVENT) 0.03 % nasal spray   Chronic diarrhea    Significant improvement with normal BM noted once per day since starting bile salts.  Recommend that he continue this to help with symptom management.  Discussed that we will need to monitor his INR and adjust the medication appropriately while taking this medication.  He  understands to take the medication at least 3.5-4 hours between coumadin dosing.  We will continue to monitor.       Relevant Medications   cholestyramine (QUESTRAN) 4 g packet   Weight loss - Primary    Patient endorses decreased diarrhea and increased appetite, however, weight loss is still evident today. Remeron started at last visit and patient reports he feels like this is helping.  Encouraged patient to increase his intake with supplements containing high protein and avoid skipping meals. The weight loss is concerning. I will review his labs and studies from previous providers and make recommendations  on any additional testing that may help eliminate or identify possible cause.       Relevant Medications   mirtazapine (REMERON) 15 MG tablet   Depression, major, single episode, mild ([place])    Patient reportedly doing well on remeron.  Will continue current dose at this time.  May need to increase based on his weight at next visit.  Monitor for mood changes.      Relevant Medications   mirtazapine (REMERON) 15 MG tablet   Coagulation defect (HCC)    INR monitoring today.  Goal 2-3      Relevant Orders   INR/PT    Meds ordered this encounter  Medications   ipratropium (ATROVENT) 0.03 % nasal spray    Sig: Place 2 sprays into both nostrils 2 (two) times daily.    Dispense:  30 mL    Refill:  6   mirtazapine (REMERON) 15 MG tablet    Sig: Take 1 tablet (15 mg total) by mouth at bedtime.    Dispense:  90 tablet    Refill:  3   cholestyramine (QUESTRAN) 4 g packet    Sig: Take 1 packet (4 g total) by mouth 3 (three) times daily with meals.    Dispense:  90 each    Refill:  12    Follow-up: Return in about 3 months (around 04/26/2021) for Weight.    Orma Render, NP

## 2021-01-27 LAB — PROTIME-INR
INR: 2.3 — ABNORMAL HIGH (ref 0.9–1.2)
Prothrombin Time: 23 s — ABNORMAL HIGH (ref 9.1–12.0)

## 2021-02-02 ENCOUNTER — Ambulatory Visit (HOSPITAL_BASED_OUTPATIENT_CLINIC_OR_DEPARTMENT_OTHER): Payer: HMO | Admitting: Nurse Practitioner

## 2021-02-06 ENCOUNTER — Encounter (HOSPITAL_BASED_OUTPATIENT_CLINIC_OR_DEPARTMENT_OTHER): Payer: Self-pay | Admitting: Nurse Practitioner

## 2021-02-23 NOTE — Progress Notes (Signed)
Cardiology Office Note    Date:  02/23/2021   ID:  Jason Day, DOB 1940/09/07, MRN [id number]  PCP:  Orma Render, NP  Cardiologist:  Dr. Johnsie Cancel   Chief Complaint: Edema / CHF   History of Present Illness:   Jason Day is a 81 y.o. male CAD s/p CABG, ICM, PAF,  MR and HLD seen in f/u   He initially presented 09/2016 w/ acute pulmonary edema and diuresed. Echo showed reduced LVEF, down to 30%, leading to LHC, which showed severe multivessel CAD. 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 Post of afib >> on amiodarone and coumadin. He is claustrophobic and cannot have MRI for EF calculation  He has had recurrent afib and failed medical Rx with amiodarone LA 51 mm diameter on TTE Seen by [facility] 12/25/18 and ablation deferred plan for rate control and anticoagulation strategy Xarelto too expensive on coumadin now   Last echo  10/05/20 with EF 30-35% moderate bi atrial enlargement moderate to severe MR   The patient had L rotator cuff arthropathy 08/21/18.   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% moderate bi atrial enlargement moderate to severe MR   Seeing [name] as primary now   Past Medical History:  Diagnosis Date   Bleeding hemorrhoids    Chronic combined systolic and diastolic heart failure (HCC)    Chronic sinus complaints    overuses afrin   Coronary atherosclerosis of native coronary artery    Dyspnea    Hyperlipidemia    Ischemic cardiomyopathy     Left atrial enlargement    Moderate mitral regurgitation    Myocardial infarction ([facility]) 09/2015   Persistent atrial fibrillation (HCC)    RBBB    A- Fib    Past Surgical History:  Procedure Laterality Date   BACK SURGERY  ~2014   disc repair   CARDIOVERSION N/A 10/24/2018   Procedure: CARDIOVERSION;  Surgeon: Jerline Pain, MD;  Location: [facility];  Service: Cardiovascular;  Laterality: N/A;   CHOLECYSTECTOMY     COLONOSCOPY  04/06/2014   Hemorrhoids and diverticulosis performed in [facility] N/A 08/04/2019   Procedure: COLONOSCOPY WITH PROPOFOL;  Surgeon: Wonda Horner, MD;  [facility];  Service: Endoscopy;  Laterality: N/A;   CORONARY ARTERY BYPASS GRAFT N/A 10/18/2016   Procedure: CORONARY ARTERY BYPASS GRAFTING (CABG) x five , using left internal mammary artery and right leg greater saphenous vein harvested endoscopically;  Surgeon: Gaye Pollack, MD;  Location: [facility];  Service: Open Heart Surgery;  Laterality: N/A;   ESOPHAGOGASTRODUODENOSCOPY (EGD) WITH PROPOFOL N/A 08/04/2019   Procedure: ESOPHAGOGASTRODUODENOSCOPY (EGD) WITH PROPOFOL;  Surgeon: Wonda Horner, MD;  [facility];  Service: Endoscopy;  Laterality: N/A;   FOOT SURGERY     HEMORRHOID BANDING     HEMORRHOID SURGERY     REVERSE SHOULDER ARTHROPLASTY Left 08/21/2018   Procedure: REVERSE SHOULDER ARTHROPLASTY;  Surgeon: Justice Britain, MD;  [facility];  Service: Orthopedics;  Laterality: Left;   RIGHT/LEFT HEART CATH AND CORONARY ANGIOGRAPHY N/A 10/16/2016   Procedure: RIGHT/LEFT HEART CATH AND CORONARY ANGIOGRAPHY;  Surgeon: Belva Crome, MD;  Location: [facility];  Service: Cardiovascular;  Laterality: N/A;   stents in liver     TEE WITHOUT CARDIOVERSION N/A 10/18/2016   Procedure: TRANSESOPHAGEAL ECHOCARDIOGRAM (TEE);  Surgeon: Gaye Pollack, MD;  Location: [facility];  Service: Open Heart Surgery;  Laterality: N/A;   TRANSURETHRAL RESECTION OF PROSTATE  N/A 11/19/2019   Procedure: CYSTOSCOPY TRANSURETHRAL RESECTION OF THE PROSTATE (TURP);  Surgeon: [name], MD;  [facility];  Service: Urology;  Laterality: N/A;    Current Medications:  Prior to Admission medications   Medication Sig Start Date End Date Taking? Authorizing Provider  atorvastatin (LIPITOR) 20 MG tablet Take 0.5 tablets (10 mg total) by mouth daily. Patient taking differently: Take 10 mg by mouth every evening.  07/29/18  Yes Josue Hector, MD  digoxin (LANOXIN) 0.125 MG tablet Take 0.5 tablets (62.5 mcg total) by mouth daily. 06/26/18  Yes Josue Hector, MD  metoprolol succinate (TOPROL-XL) 25 MG 24 hr tablet Take 0.5 tablets (12.5 mg total) by mouth daily. Patient taking differently: Take 12.5 mg by mouth at bedtime.  06/26/18  Yes Josue Hector, MD  sacubitril-valsartan (ENTRESTO) 49-51 MG Take 1 tablet by mouth 2 (two) times daily. 07/29/18  Yes Josue Hector, MD  spironolactone (ALDACTONE) 25 MG tablet Take 0.5 tablets (12.5 mg total) by mouth daily. Patient taking differently: Take 12.5 mg by mouth every evening.  06/26/18  Yes Josue Hector, MD  warfarin (COUMADIN) 5 MG tablet Take as directed by [facility] Clinic Patient taking differently: Take 5 mg by mouth every evening. Take as directed by Coumadin Clinic 06/26/18  Yes Josue Hector, MD   Allergies:   Patient has no known allergies.   Social History   Socioeconomic History   Marital status: Married    Spouse name: Not on file   Number of children: 2   Years of education: Not on file   Highest education level: Not on file  Occupational History   Occupation: [employer]: Retired  Tobacco Use   Smoking status: Former    Packs/day: 0.50    Types: Cigarettes    Quit date: 1970    Years since quitting: 53.0   Smokeless tobacco: Never   Tobacco comments:    Smoked on and off  Scientific laboratory technician Use: Never used  Substance and Sexual Activity   Alcohol use: No   Drug use:  No   Sexual activity: Yes  Other Topics Concern   Not on file  Social History Narrative   Married    Originally from [place] moved to [place] age 31 moved to [place]   1 son Jason Day in [place]   1dtr [place] in [place]       Has living will   Wife is health care POA--then son   Would accept resuscitation   No tube feeds if cognitively unaware   Social Determinants of Health   Financial Resource Strain: Not on file  Food Insecurity: Not on file  Transportation Needs: Not on file  Physical Activity: Not on file  Stress: Not on file  Social Connections: Not on file     Family History:  The patient's family history includes COPD in his father; Emphysema in  his father; Healthy in his brother.   ROS:   Please see the history of present illness.    ROS All other systems reviewed and are negative.   PHYSICAL EXAM:   VS:  There were no vitals taken for this visit.    Argumentative  Chronically ill male HEENT: normal Neck supple with no adenopathy JVP normal no bruits no thyromegaly Lungs clear with no wheezing and good diaphragmatic motion Heart:  S1/S2 apical MR murmur, no rub, gallop or click PMI  Enlarged  post sternotomy  Abdomen: benighn, BS positve, no tenderness, no AAA no bruit.  No HSM or HJR Foley catheter strapped to right leg  Distal pulses intact with no bruits Plus one edema bad varicosities bilaterally chronic stasis  Neuro non-focal Skin warm and dry No muscular weakness   Wt Readings from Last 3 Encounters:  01/26/21 157 lb (71.2 kg)  12/29/20 159 lb (72.1 kg)  12/27/20 162 lb (73.5 kg)      Studies/Labs Reviewed:   EKG:  9/10/21afib RBBB LAD old IMI rate 66   Recent Labs: 08/31/2020: ALT 11 12/27/2020: BUN 22; Creatinine, Ser 1.35; Hemoglobin 14.9; NT-Pro BNP 3,044; Platelets 133; Potassium 4.2; Sodium 143 12/29/2020: TSH 2.690   Lipid Panel    Component Value Date/Time   CHOL 96 (L) 08/31/2020 1011   TRIG 53  08/31/2020 1011   HDL 41 08/31/2020 1011   CHOLHDL 2.3 08/31/2020 1011   CHOLHDL 6 04/22/2017 1431   VLDL 9 10/15/2016 1155   LDLCALC 42 08/31/2020 1011   LDLDIRECT 119.0 04/22/2017 1431    Additional studies/ records that were reviewed today include:   Echo 10/05/20 IMPRESSIONS     1. Left ventricular ejection fraction, by estimation, is 30 to 35%. The  left ventricle has moderately decreased function. The left ventricle  demonstrates global hypokinesis. Left ventricular diastolic parameters are  consistent with Grade II diastolic  dysfunction (pseudonormalization).   2. Right ventricular systolic function is normal. The right ventricular  size is normal. There is mildly elevated pulmonary artery systolic  pressure.   3. Left atrial size was moderately dilated.   4. Right atrial size was moderately dilated.   5. The mitral valve is normal in structure. Moderate to severe mitral  valve regurgitation. No evidence of mitral stenosis.   6. The aortic valve is tricuspid. Aortic valve regurgitation is not  visualized. Mild to moderate aortic valve sclerosis/calcification is  present, without any evidence of aortic stenosis.   7. The inferior vena cava is dilated in size with <50% respiratory  variability, suggesting right atrial pressure of 15 mmHg.   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 Similar size as prior.      ASSESSMENT & PLAN:    1. Acute on chronic combined CHF  - LVEF of 30-35% with moderate to severe MR  Both Dr Rayann Heman and Quentin Ore deferred CRT pacing given age and narrow RBBB Euvolemic on good medical Rx Getting assistance with Entresto   2. CAD s/p CABG -Recent EKG with nonspecific changes.  Patient denies any anginal symptoms.  Should be on 81 mg ASA   3 Chronic Afib  - on coumadin , Xarelto too expensive Failed cardioversion and medical Rx with amiodarone Seen by Dr Rayann Heman 12/25/18 and ablation deferred adopted  strategy of rate control and anticoagulation LA dimension by echo 51 mm INR Rx 2.2 today   5.  Mitral regurgitation  -ischemic moderate to severe  concern  for posterior basal aneurysm and feasibility of mitral clip will need to review with structural team if he becomes more symptomatic   6. Edema:  Discussed seeing vein doctors at [facility] Compression stockings and diuretic with elevation of legs   7. Urology:  F/u post cystoscopy / foley out   8. Behavioral:  needs to f/u with primary as he has dementia with behavioral issues that are getting worse. Discussed taking to primary about appetite stimulant as well Suggested Boost supplement   Medication Adjustments/Labs and Tests Ordered:  None-  long discussion about risk of recurrent CHF given CAD/ischemic DCM/ischemic MR And LE venous varicosities    F/U in 6 months   Signed, Jenkins Rouge, MD  02/23/2021 9:51 AM    [facility] [place], [place], [place]  [address] Phone: [phone]; Fax: [phone]

## 2021-03-08 ENCOUNTER — Ambulatory Visit (INDEPENDENT_AMBULATORY_CARE_PROVIDER_SITE_OTHER): Payer: HMO | Admitting: Cardiovascular Disease

## 2021-03-08 ENCOUNTER — Ambulatory Visit (INDEPENDENT_AMBULATORY_CARE_PROVIDER_SITE_OTHER): Payer: HMO | Admitting: *Deleted

## 2021-03-08 ENCOUNTER — Encounter: Payer: Self-pay | Admitting: Cardiovascular Disease

## 2021-03-08 ENCOUNTER — Other Ambulatory Visit: Payer: Self-pay

## 2021-03-08 VITALS — BP 120/72 | HR 76 | Ht 76.0 in | Wt 168.0 lb

## 2021-03-08 DIAGNOSIS — I872 Venous insufficiency (chronic) (peripheral): Secondary | ICD-10-CM

## 2021-03-08 DIAGNOSIS — I340 Nonrheumatic mitral (valve) insufficiency: Secondary | ICD-10-CM | POA: Diagnosis not present

## 2021-03-08 DIAGNOSIS — Z951 Presence of aortocoronary bypass graft: Secondary | ICD-10-CM

## 2021-03-08 DIAGNOSIS — I482 Chronic atrial fibrillation, unspecified: Secondary | ICD-10-CM

## 2021-03-08 DIAGNOSIS — Z5181 Encounter for therapeutic drug level monitoring: Secondary | ICD-10-CM | POA: Diagnosis not present

## 2021-03-08 DIAGNOSIS — I255 Ischemic cardiomyopathy: Secondary | ICD-10-CM

## 2021-03-08 LAB — POCT INR: INR: 2.2 (ref 2.0–3.0)

## 2021-03-08 NOTE — Patient Instructions (Signed)
Description   Continue taking warfarin 1 tablet daily. Recheck INR in 5 weeks. (Per pt request, aware of risk). Call coumadin clinic for any changes in medications or upcoming procedures. [phone]

## 2021-03-08 NOTE — Patient Instructions (Signed)
Medication Instructions:  °Your physician recommends that you continue on your current medications as directed. Please refer to the Current Medication list given to you today. ° °*If you need a refill on your cardiac medications before your next appointment, please call your pharmacy* ° °Lab Work: °If you have labs (blood work) drawn today and your tests are completely normal, you will receive your results only by: °MyChart Message (if you have MyChart) OR °A paper copy in the mail °If you have any lab test that is abnormal or we need to change your treatment, we will call you to review the results. ° °Testing/Procedures: °None ordered today. ° °Follow-Up: °At [facility], you and your health needs are our priority.  As part of our continuing mission to provide you with exceptional heart care, we have created designated Provider Care Teams.  These Care Teams include your primary Cardiologist (physician) and Advanced Practice Providers (APPs -  Physician Assistants and Nurse Practitioners) who all work together to provide you with the care you need, when you need it. ° °We recommend signing up for the patient portal called "MyChart".  Sign up information is provided on this After Visit Summary.  MyChart is used to connect with patients for Virtual Visits (Telemedicine).  Patients are able to view lab/test results, encounter notes, upcoming appointments, etc.  Non-urgent messages can be sent to your provider as well.   °To learn more about what you can do with MyChart, go to [email or web address].   ° °Your next appointment:   °6 month(s) ° °The format for your next appointment:   °In Person ° °Provider:   °Peter Nishan, MD { ° ° °

## 2021-03-24 ENCOUNTER — Other Ambulatory Visit: Payer: Self-pay | Admitting: Cardiovascular Disease

## 2021-03-24 NOTE — Telephone Encounter (Signed)
Prescription refill request received for warfarin Lov: Jason Day 03/08/2021 Next INR check: 2/22 Warfarin tablet strength: 5mg 

## 2021-04-03 ENCOUNTER — Ambulatory Visit: Payer: HMO | Admitting: Cardiovascular Disease

## 2021-04-12 ENCOUNTER — Ambulatory Visit (INDEPENDENT_AMBULATORY_CARE_PROVIDER_SITE_OTHER): Payer: HMO | Admitting: *Deleted

## 2021-04-12 ENCOUNTER — Other Ambulatory Visit: Payer: Self-pay

## 2021-04-12 DIAGNOSIS — Z5181 Encounter for therapeutic drug level monitoring: Secondary | ICD-10-CM | POA: Diagnosis not present

## 2021-04-12 DIAGNOSIS — Z951 Presence of aortocoronary bypass graft: Secondary | ICD-10-CM

## 2021-04-12 LAB — POCT INR: INR: 1.9 — AB (ref 2.0–3.0)

## 2021-04-12 NOTE — Patient Instructions (Signed)
Description   Take 1.5 tablets of warfarin today, then continue taking warfarin 1 tablet daily. Recheck INR in 6 weeks. (Per pt request, aware of risk). Call coumadin clinic for any changes in medications or upcoming procedures. [phone]

## 2021-04-27 ENCOUNTER — Ambulatory Visit (HOSPITAL_BASED_OUTPATIENT_CLINIC_OR_DEPARTMENT_OTHER): Payer: HMO | Admitting: Nurse Practitioner

## 2021-05-21 ENCOUNTER — Other Ambulatory Visit: Payer: Self-pay | Admitting: Cardiovascular Disease

## 2021-05-22 NOTE — Telephone Encounter (Signed)
Request for warfarin refill: ?LOV 03/08/21  P Nishan MD ?Last INR was 1.9 on 04/12/21 ?Next INR due on 05/24/21 ?Refill approved. ?

## 2021-05-24 ENCOUNTER — Other Ambulatory Visit: Payer: Self-pay

## 2021-05-24 ENCOUNTER — Ambulatory Visit (INDEPENDENT_AMBULATORY_CARE_PROVIDER_SITE_OTHER): Payer: HMO | Admitting: Family Medicine

## 2021-05-24 ENCOUNTER — Encounter: Payer: Self-pay | Admitting: Family Medicine

## 2021-05-24 ENCOUNTER — Ambulatory Visit (INDEPENDENT_AMBULATORY_CARE_PROVIDER_SITE_OTHER): Payer: HMO

## 2021-05-24 DIAGNOSIS — R413 Other amnesia: Secondary | ICD-10-CM

## 2021-05-24 DIAGNOSIS — K591 Functional diarrhea: Secondary | ICD-10-CM | POA: Diagnosis not present

## 2021-05-24 DIAGNOSIS — Z5181 Encounter for therapeutic drug level monitoring: Secondary | ICD-10-CM | POA: Diagnosis not present

## 2021-05-24 DIAGNOSIS — R634 Abnormal weight loss: Secondary | ICD-10-CM

## 2021-05-24 DIAGNOSIS — F320 Major depressive disorder, single episode, mild: Secondary | ICD-10-CM

## 2021-05-24 DIAGNOSIS — Z951 Presence of aortocoronary bypass graft: Secondary | ICD-10-CM | POA: Diagnosis not present

## 2021-05-24 LAB — POCT INR: INR: 3.1 — AB (ref 2.0–3.0)

## 2021-05-24 MED ORDER — FLUTICASONE PROPIONATE 50 MCG/ACT NA SUSP: 2.0000 | Freq: Every day | NASAL | 6 refills | Status: DC

## 2021-05-24 MED ORDER — LORATADINE 10 MG PO TABS: 10.0000 mg | ORAL_TABLET | Freq: Every day | ORAL | 2 refills | Status: DC

## 2021-05-24 NOTE — Assessment & Plan Note (Signed)
Weight loss seems stable at around 160 since fall of 2022 when he was 170.  Will recheck labs. He is hard to follow regarding diet.  Seems like he took remeron which helped according to prior notes but he does not want to take now.  Will see him back in one month.   ?

## 2021-05-24 NOTE — Assessment & Plan Note (Signed)
Alert conversant without any complaints of depression today.  Is not taking Remeron by his report  ?

## 2021-05-24 NOTE — Assessment & Plan Note (Signed)
He is alone today and seems to function well using his phone appropriately.  However he is vague as to why he stopped prior medications and seems a bit erractic in following up with me and with NP Early more recently.  May need to address further and perhaps a [place] clinic visit if he is agreeable  ?

## 2021-05-24 NOTE — Assessment & Plan Note (Signed)
After a discussion today he does not seem to want any further therapy or work up.  Will follow  ?

## 2021-05-24 NOTE — Patient Instructions (Signed)
Description   ?Continue taking warfarin 1 tablet daily.  ?Recheck INR in 6 weeks.  ?Call coumadin clinic for any changes in medications or upcoming procedures. [phone] ?  ?   ?

## 2021-05-24 NOTE — Patient Instructions (Signed)
Good to see you today - Thank you for coming in ? ?Things we discussed today: ? ?Weight ?Checks I will call you or MYchart about the results.   Keep eating regularly ? ?Nose Dripping ?- start loratadine once a day ?- flonase spray once a day ? ?Please always bring your medication bottles ? ?Come back to see me in 1 month  ?

## 2021-05-24 NOTE — Progress Notes (Signed)
? ? ?  SUBJECTIVE:  ? ?CHIEF COMPLAINT / HPI:  ? ?Saw last in July 2022.  Since established care with NP Early first in 12/2020.   Last in 01/2021 for depression and weight loss.  Sees cardiology regularly. ?Today he relates he would like to follow with me regularly. ?He seems to know his medications - brought up a list on his phone.  Also has a list on phone for his questions today.  ? ?Weight loss ?He feels this is better.  Took Remeron for "a little while" but then stopped.  Thinks he does not eat enough.  No fevers or vomiting ? ?Nasal Drip ?Often when he bends over or eats or at night.  Uses Afrin nasal spray every night due to nose stuffiness but not during the day.  Tried Atrovent nasal for 2 days but does not want to ever use again because did not help.  Does not recall every taking allergy pills or nasal spray. No bleeding no face pain ? ?Diarrhea ?Has intermittent diarrhea associated with gas.  Was prescribed Cholestyramine which he took for a few days.  Maybe it helped but he does not want to try again. No bleeding Had upper lower endo June 2021  ? ?PERTINENT  PMH / PSH: Follows with cardiology Dr Johnsie Cancel regularly  ? ?OBJECTIVE:  ? ?BP 128/75   Pulse 75   Wt 160 lb 6.4 oz (72.8 kg)   SpO2 98%   BMI 19.52 kg/m?   ?Heart - irreg irreg ?Lungs - clear ?Abdomen - old scars no HSM or tenderness ?Neck:  No deformities, thyromegaly, masses, or tenderness noted.   Supple with full range of motion without pain. ?Mobility:able to get up and down from exam table without assistance or distress ? ? ?ASSESSMENT/PLAN:  ? ?Weight loss ?Weight loss seems stable at around 160 since fall of 2022 when he was 170.  Will recheck labs. He is hard to follow regarding diet.  Seems like he took remeron which helped according to prior notes but he does not want to take now.  Will see him back in one month.   ? ?Memory impairment ?He is alone today and seems to function well using his phone appropriately.  However he is vague as  to why he stopped prior medications and seems a bit erractic in following up with me and with NP Early more recently.  May need to address further and perhaps a [place] clinic visit if he is agreeable  ? ?Diarrhea ?After a discussion today he does not seem to want any further therapy or work up.  Will follow  ? ?Depression, major, single episode, mild ([place]) ?Alert conversant without any complaints of depression today.  Is not taking Remeron by his report  ?  ? ? ?Lind Covert, MD ?[place]  ?

## 2021-05-25 ENCOUNTER — Encounter: Payer: Self-pay | Admitting: Family Medicine

## 2021-05-25 LAB — COMPREHENSIVE METABOLIC PANEL
ALT: 12 IU/L (ref 0–44)
AST: 24 IU/L (ref 0–40)
Albumin/Globulin Ratio: 2.4 — ABNORMAL HIGH (ref 1.2–2.2)
Albumin: 4.3 g/dL (ref 3.7–4.7)
Alkaline Phosphatase: 104 IU/L (ref 44–121)
BUN/Creatinine Ratio: 16 (ref 10–24)
BUN: 20 mg/dL (ref 8–27)
Bilirubin Total: 3.1 mg/dL — ABNORMAL HIGH (ref 0.0–1.2)
CO2: 29 mmol/L (ref 20–29)
Calcium: 9.2 mg/dL (ref 8.6–10.2)
Chloride: 100 mmol/L (ref 96–106)
Creatinine, Ser: 1.25 mg/dL (ref 0.76–1.27)
Globulin, Total: 1.8 g/dL (ref 1.5–4.5)
Glucose: 82 mg/dL (ref 70–99)
Potassium: 4.2 mmol/L (ref 3.5–5.2)
Sodium: 142 mmol/L (ref 134–144)
Total Protein: 6.1 g/dL (ref 6.0–8.5)
eGFR: 58 mL/min/{1.73_m2} — ABNORMAL LOW (ref >59)

## 2021-05-25 LAB — CBC
Hematocrit: 39.4 % (ref 37.5–51.0)
Hemoglobin: 13.6 g/dL (ref 13.0–17.7)
MCH: 31.2 pg (ref 26.6–33.0)
MCHC: 34.5 g/dL (ref 31.5–35.7)
MCV: 90 fL (ref 79–97)
Platelets: 110 10*3/uL — ABNORMAL LOW (ref 150–450)
RBC: 4.36 x10E6/uL (ref 4.14–5.80)
RDW: 13.7 % (ref 11.6–15.4)
WBC: 4.7 10*3/uL (ref 3.4–10.8)

## 2021-05-25 LAB — TSH: TSH: 2.22 u[IU]/mL (ref 0.450–4.500)

## 2021-05-27 ENCOUNTER — Other Ambulatory Visit: Payer: Self-pay | Admitting: Cardiovascular Disease

## 2021-06-06 ENCOUNTER — Telehealth: Payer: Self-pay

## 2021-06-06 NOTE — Telephone Encounter (Signed)
Patient calls nurse line requesting orders for portable oxygen.  ? ?Advised patient insurance typically requires specific O2 saturations to qualify. However, will forward to PCP for review. Patient stated, "my doctor knows me and knows I need oxygen." ? ?Patient does have FU apt on 5/10. ? ? ?

## 2021-06-09 ENCOUNTER — Encounter: Payer: Self-pay | Admitting: Family Medicine

## 2021-06-09 NOTE — Telephone Encounter (Signed)
See my MyChart message  ?

## 2021-06-28 ENCOUNTER — Encounter: Payer: Self-pay | Admitting: Family Medicine

## 2021-06-28 ENCOUNTER — Ambulatory Visit (INDEPENDENT_AMBULATORY_CARE_PROVIDER_SITE_OTHER): Payer: HMO | Admitting: Family Medicine

## 2021-06-28 DIAGNOSIS — R634 Abnormal weight loss: Secondary | ICD-10-CM

## 2021-06-28 DIAGNOSIS — R0602 Shortness of breath: Secondary | ICD-10-CM

## 2021-06-28 NOTE — Assessment & Plan Note (Signed)
Fluctuating.  Suggested increase intake and try ensure shakes.   Will monitor. Does not seem to be worsening and lab cancer screening unrevealing.  Possibly due to chronic heart failure  ?

## 2021-06-28 NOTE — Assessment & Plan Note (Signed)
Seems primarily at night.  Need to rule out sleep apnea.  His HF seems well controlled so less likely PND.  Persistently complains of nasal congestion and rhinorrehea.  Did not respond to allergy therapy.  May need ENT review if sleep study not helpful  ?

## 2021-06-28 NOTE — Progress Notes (Signed)
? ? ?  SUBJECTIVE:  ? ?CHIEF COMPLAINT / HPI:  ? ?Weight loss ?Weight is down 3 lbs.  Has been around 160 since October.  Feels he is eating well.  No nausea and vomiting. Is not taking supplements ?  ?Shortness of Breath ?Feels he needs oxygen because wakes up very short of breath at night.  Has to gasp.  His weight and edema is well controlled with his heart failure medications.  No shortness of breath during day with exertion ? ?Nose congestion ?Has intermittent congestion with rhinorrhea especially at night.  Tried flonase for a month and loratadine - did not help.  No pain or purulence.  Uses Afrin regularly at night ? ?PERTINENT  PMH / PSH: lives with wife ?Brings in all his medication bottles - Updated medication list  ? ?OBJECTIVE:  ? ?BP (!) 120/55   Pulse 71   Ht '6\' 4"'$  (1.93 m)   Wt 157 lb 3.2 oz (71.3 kg)   SpO2 100%   BMI 19.13 kg/m?   ?Thin  ?Mobility:able to get up and down from exam table without assistance or distress ?No pedal edema ?H - irreg irreg ?L - clear  ? ?ASSESSMENT/PLAN:  ? ?Weight loss ?Fluctuating.  Suggested increase intake and try ensure shakes.   Will monitor. Does not seem to be worsening and lab cancer screening unrevealing.  Possibly due to chronic heart failure  ? ?Shortness of breath ?Seems primarily at night.  Need to rule out sleep apnea.  His HF seems well controlled so less likely PND.  Persistently complains of nasal congestion and rhinorrehea.  Did not respond to allergy therapy.  May need ENT review if sleep study not helpful  ?  ?Patient Instructions  ?Good to see you today - Thank you for coming in ? ?Things we discussed today: ? ?I will order a sleep study and they will contact you to set it up ? ?I updated your medication list ?  ?Please always bring your medication bottles ? ?Come back to see me in 2 months  ? ? ?Lind Covert, MD ?[place]  ?

## 2021-06-28 NOTE — Patient Instructions (Signed)
Good to see you today - Thank you for coming in ? ?Things we discussed today: ? ?I will order a sleep study and they will contact you to set it up ? ?I updated your medication list ?  ?Please always bring your medication bottles ? ?Come back to see me in 2 months  ?

## 2021-07-13 ENCOUNTER — Ambulatory Visit (INDEPENDENT_AMBULATORY_CARE_PROVIDER_SITE_OTHER): Payer: HMO

## 2021-07-13 ENCOUNTER — Telehealth: Payer: Self-pay | Admitting: Family Medicine

## 2021-07-13 DIAGNOSIS — Z5181 Encounter for therapeutic drug level monitoring: Secondary | ICD-10-CM

## 2021-07-13 DIAGNOSIS — Z951 Presence of aortocoronary bypass graft: Secondary | ICD-10-CM

## 2021-07-13 LAB — POCT INR: INR: 2 (ref 2.0–3.0)

## 2021-07-13 NOTE — Telephone Encounter (Signed)
Pt. 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  He has been suffering for over 4 years and believes there is a cure.  He is also concerned that he never received a call regarding the referral for a sleep test. Please  call [phone]      Paper placed in your box

## 2021-07-13 NOTE — Telephone Encounter (Signed)
Got his vm Left number for sleep cnter to schedule sleep test Asked him to call back and leave time and number and I will call him to discuss Gilberts

## 2021-07-13 NOTE — Patient Instructions (Addendum)
Description   Take 1.5 tablets today, then resume same dosage of Warfarin 1 tablet daily.  Recheck INR in 9 weeks per pt request (Pt aware of risk).  Call coumadin clinic for any changes in medications or upcoming procedures. [phone]

## 2021-07-14 ENCOUNTER — Encounter: Payer: Self-pay | Admitting: Family Medicine

## 2021-07-14 NOTE — Telephone Encounter (Signed)
No Answer Left VM Told I would try again and send My chart message   Called again - another VM Asked him to call with times and number and I will call

## 2021-07-14 NOTE — Telephone Encounter (Signed)
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   Patient would like to discuss specifically a cure for the disease.   Will forward to PCP.

## 2021-07-25 ENCOUNTER — Encounter: Payer: Self-pay | Admitting: *Deleted

## 2021-08-10 ENCOUNTER — Telehealth (HOSPITAL_BASED_OUTPATIENT_CLINIC_OR_DEPARTMENT_OTHER): Payer: Self-pay | Admitting: Nurse Practitioner

## 2021-08-19 ENCOUNTER — Other Ambulatory Visit: Payer: Self-pay | Admitting: Cardiovascular Disease

## 2021-09-04 ENCOUNTER — Other Ambulatory Visit: Payer: Self-pay | Admitting: Cardiovascular Disease

## 2021-09-28 ENCOUNTER — Other Ambulatory Visit: Payer: Self-pay | Admitting: Cardiovascular Disease

## 2021-10-03 ENCOUNTER — Other Ambulatory Visit: Payer: Self-pay | Admitting: Cardiovascular Disease

## 2021-10-11 ENCOUNTER — Ambulatory Visit (INDEPENDENT_AMBULATORY_CARE_PROVIDER_SITE_OTHER): Payer: HMO

## 2021-10-11 DIAGNOSIS — Z5181 Encounter for therapeutic drug level monitoring: Secondary | ICD-10-CM

## 2021-10-11 DIAGNOSIS — Z951 Presence of aortocoronary bypass graft: Secondary | ICD-10-CM | POA: Diagnosis not present

## 2021-10-11 LAB — POCT INR: INR: 2.8 (ref 2.0–3.0)

## 2021-10-11 NOTE — Patient Instructions (Signed)
Description   Continue taking Warfarin 1 tablet daily.  Recheck INR in 9 weeks per pt request (Pt aware of risk).  Call coumadin clinic for any changes in medications or upcoming procedures. [phone]

## 2021-10-12 ENCOUNTER — Other Ambulatory Visit (HOSPITAL_BASED_OUTPATIENT_CLINIC_OR_DEPARTMENT_OTHER): Payer: Self-pay | Admitting: Nurse Practitioner

## 2021-10-12 ENCOUNTER — Ambulatory Visit (INDEPENDENT_AMBULATORY_CARE_PROVIDER_SITE_OTHER): Payer: HMO | Admitting: Nurse Practitioner

## 2021-10-12 ENCOUNTER — Encounter (HOSPITAL_BASED_OUTPATIENT_CLINIC_OR_DEPARTMENT_OTHER): Payer: Self-pay | Admitting: Nurse Practitioner

## 2021-10-12 VITALS — BP 124/64 | HR 57 | Ht 72.0 in | Wt 153.0 lb

## 2021-10-12 DIAGNOSIS — Z630 Problems in relationship with spouse or partner: Secondary | ICD-10-CM | POA: Diagnosis not present

## 2021-10-12 DIAGNOSIS — D689 Coagulation defect, unspecified: Secondary | ICD-10-CM | POA: Diagnosis not present

## 2021-10-12 DIAGNOSIS — K529 Noninfective gastroenteritis and colitis, unspecified: Secondary | ICD-10-CM | POA: Diagnosis not present

## 2021-10-12 DIAGNOSIS — I5042 Chronic combined systolic (congestive) and diastolic (congestive) heart failure: Secondary | ICD-10-CM

## 2021-10-12 DIAGNOSIS — I482 Chronic atrial fibrillation, unspecified: Secondary | ICD-10-CM | POA: Diagnosis not present

## 2021-10-12 DIAGNOSIS — I25758 Atherosclerosis of native coronary artery of transplanted heart with other forms of angina pectoris: Secondary | ICD-10-CM | POA: Diagnosis not present

## 2021-10-12 DIAGNOSIS — K909 Intestinal malabsorption, unspecified: Secondary | ICD-10-CM

## 2021-10-12 DIAGNOSIS — R768 Other specified abnormal immunological findings in serum: Secondary | ICD-10-CM | POA: Diagnosis not present

## 2021-10-12 DIAGNOSIS — R197 Diarrhea, unspecified: Secondary | ICD-10-CM | POA: Diagnosis not present

## 2021-10-12 DIAGNOSIS — R634 Abnormal weight loss: Secondary | ICD-10-CM | POA: Diagnosis not present

## 2021-10-12 DIAGNOSIS — R413 Other amnesia: Secondary | ICD-10-CM | POA: Diagnosis not present

## 2021-10-12 NOTE — Patient Instructions (Addendum)
    We will check your labs and stool today to see if we can find anything.   I am going to send in the referral for social work  We will also look into a dietician/nutritionist  I am going to check on the medications that may be helpful for runny nose and the supplement you are taking I will be in touch with you about the labs   We will plan to circle back around in a few weeks and come up with a plan
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
## 2021-10-13 LAB — HEPATITIS A ANTIBODY, TOTAL: hep A Total Ab: POSITIVE — AB

## 2021-10-13 LAB — COMPREHENSIVE METABOLIC PANEL
ALT: 14 IU/L (ref 0–44)
AST: 18 IU/L (ref 0–40)
Albumin/Globulin Ratio: 1.9 (ref 1.2–2.2)
Albumin: 4.5 g/dL (ref 3.8–4.8)
Alkaline Phosphatase: 102 IU/L (ref 44–121)
BUN/Creatinine Ratio: 18 (ref 10–24)
BUN: 22 mg/dL (ref 8–27)
Bilirubin Total: 2.2 mg/dL — ABNORMAL HIGH (ref 0.0–1.2)
CO2: 23 mmol/L (ref 20–29)
Calcium: 9.2 mg/dL (ref 8.6–10.2)
Chloride: 103 mmol/L (ref 96–106)
Creatinine, Ser: 1.24 mg/dL (ref 0.76–1.27)
Globulin, Total: 2.4 g/dL (ref 1.5–4.5)
Glucose: 92 mg/dL (ref 70–99)
Potassium: 5 mmol/L (ref 3.5–5.2)
Sodium: 142 mmol/L (ref 134–144)
Total Protein: 6.9 g/dL (ref 6.0–8.5)
eGFR: 59 mL/min/{1.73_m2} — ABNORMAL LOW (ref >59)

## 2021-10-13 LAB — CBC WITH DIFF/PLATELET
Basophils Absolute: 0 10*3/uL (ref 0.0–0.2)
Basos: 0 %
EOS (ABSOLUTE): 0.1 10*3/uL (ref 0.0–0.4)
Eos: 1 %
Hematocrit: 43.9 % (ref 37.5–51.0)
Hemoglobin: 14.7 g/dL (ref 13.0–17.7)
Immature Grans (Abs): 0 10*3/uL (ref 0.0–0.1)
Immature Granulocytes: 0 %
Lymphocytes Absolute: 1 10*3/uL (ref 0.7–3.1)
Lymphs: 21 %
MCH: 31 pg (ref 26.6–33.0)
MCHC: 33.5 g/dL (ref 31.5–35.7)
MCV: 93 fL (ref 79–97)
Monocytes Absolute: 0.4 10*3/uL (ref 0.1–0.9)
Monocytes: 8 %
Neutrophils Absolute: 3.5 10*3/uL (ref 1.4–7.0)
Neutrophils: 70 %
Platelets: 110 10*3/uL — ABNORMAL LOW (ref 150–450)
RBC: 4.74 x10E6/uL (ref 4.14–5.80)
RDW: 14.4 % (ref 11.6–15.4)
WBC: 5 10*3/uL (ref 3.4–10.8)

## 2021-10-13 LAB — LIPID PANEL
Chol/HDL Ratio: 2.3 ratio (ref 0.0–5.0)
Cholesterol, Total: 103 mg/dL (ref 100–199)
HDL: 44 mg/dL (ref >39)
LDL Chol Calc (NIH): 45 mg/dL (ref 0–99)
Triglycerides: 61 mg/dL (ref 0–149)
VLDL Cholesterol Cal: 14 mg/dL (ref 5–40)

## 2021-10-13 LAB — THYROID PANEL WITH TSH
Free Thyroxine Index: 2.1 (ref 1.2–4.9)
T3 Uptake Ratio: 29 % (ref 24–39)
T4, Total: 7.4 ug/dL (ref 4.5–12.0)
TSH: 1.96 u[IU]/mL (ref 0.450–4.500)

## 2021-10-13 LAB — MAGNESIUM: Magnesium: 2 mg/dL (ref 1.6–2.3)

## 2021-10-13 LAB — VITAMIN D 25 HYDROXY (VIT D DEFICIENCY, FRACTURES): Vit D, 25-Hydroxy: 44.8 ng/mL (ref 30.0–100.0)

## 2021-10-13 LAB — IRON,TIBC AND FERRITIN PANEL
Ferritin: 130 ng/mL (ref 30–400)
Iron Saturation: 24 % (ref 15–55)
Iron: 71 ug/dL (ref 38–169)
Total Iron Binding Capacity: 292 ug/dL (ref 250–450)
UIBC: 221 ug/dL (ref 111–343)

## 2021-10-13 LAB — B12 AND FOLATE PANEL
Folate: 14.6 ng/mL (ref >3.0)
Vitamin B-12: 265 pg/mL (ref 232–1245)

## 2021-10-16 ENCOUNTER — Other Ambulatory Visit (HOSPITAL_BASED_OUTPATIENT_CLINIC_OR_DEPARTMENT_OTHER): Payer: Self-pay | Admitting: Nurse Practitioner

## 2021-10-16 DIAGNOSIS — R768 Other specified abnormal immunological findings in serum: Secondary | ICD-10-CM

## 2021-10-16 LAB — C DIFFICILE, CYTOTOXIN B

## 2021-10-16 LAB — C DIFFICILE TOXINS A+B W/RFLX: C difficile Toxins A+B, EIA: NEGATIVE

## 2021-10-17 ENCOUNTER — Telehealth (HOSPITAL_BASED_OUTPATIENT_CLINIC_OR_DEPARTMENT_OTHER): Payer: Self-pay | Admitting: Nurse Practitioner

## 2021-10-17 ENCOUNTER — Telehealth (HOSPITAL_BASED_OUTPATIENT_CLINIC_OR_DEPARTMENT_OTHER): Payer: Self-pay

## 2021-10-17 NOTE — Telephone Encounter (Signed)
Please call Jason Day and let him know that we are still waiting on the remainder of test results to come in. 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 We have to wait for the remainder of the testing to come in before we can determine what is actually going on.   A result note was sent for patient to be called 08/28 regarding the results. Please refer to that note for additional information on labs.   Please let him know that he can schedule a phone visit and we can go over everything once all of the results have come through. I don't know that I have the ability to do a three way call on this end, but will look into that.   The appt will need to be last of the day and allot for at least 40 minutes- most likely an hour due to the complex nature of his concerns.

## 2021-10-17 NOTE — Telephone Encounter (Signed)
Returned patient call no answer, left voicemail for patient to call me back.

## 2021-10-17 NOTE — Telephone Encounter (Signed)
Pt called on 8/29 @ 2:55 was very upset. Pt stated that he has been calling the nurse line and no one has called him back regarding his results on what he needs to do for medication for this issue. I read the note that was put in below. Read it word for word to pt. He is just wanting answers and would like someone to call him back. Please advise.

## 2021-10-18 ENCOUNTER — Telehealth (HOSPITAL_BASED_OUTPATIENT_CLINIC_OR_DEPARTMENT_OTHER): Payer: Self-pay

## 2021-10-18 ENCOUNTER — Encounter (HOSPITAL_BASED_OUTPATIENT_CLINIC_OR_DEPARTMENT_OTHER): Payer: Self-pay | Admitting: Nurse Practitioner

## 2021-10-18 LAB — HEPATITIS A ANTIBODY, IGM: Hep A IgM: NEGATIVE

## 2021-10-18 LAB — SPECIMEN STATUS REPORT

## 2021-10-18 NOTE — Telephone Encounter (Signed)
I called patient to review his labs with him he immediately begin to swear, curse at me and use in appropriate language. I advised him we were waiting on the stool sample for a complete lab results. After spending 15 mins on the phone with him, I spoke with Laretta Bolster and shared the conversation with her. He was dismissed from the practice. She asked me to call his daughter and explain the labs as Laretta Bolster had just got the stool sample and all labs were normal. I spoke with his daughter [name] whom lives in [place]. I did inform her that he would be discharged from the practice as we have a 0 tolerance for verbal abuse. She stated " that is why I live in [place] and was very apologetic.   [employer]

## 2021-10-18 NOTE — Telephone Encounter (Signed)
Thank you for relaying the message. CMA has reached out but not received an answer. He has asked that we only give information to him. If he cannot be reached, it would be appropriate to reach out to his daughter. Detailed information is listed in the result note for the labs.    We are still awaiting results of all the testing at this time, therefore we do not have a diagnosed condition and no treatments or medications can be prescribed.   OK to place him as acute virtual and I will be happy to call him or his daughter after clinic today to go over what we have so far and discuss with him. Due to the time required, will need to be after clinic.

## 2021-10-18 NOTE — Telephone Encounter (Signed)
CMA encounters not documented.

## 2021-10-20 ENCOUNTER — Encounter (HOSPITAL_BASED_OUTPATIENT_CLINIC_OR_DEPARTMENT_OTHER): Payer: Self-pay | Admitting: Nurse Practitioner

## 2021-10-20 NOTE — Telephone Encounter (Signed)
Continuance of patient concerns divided into 3 separate encounters. Please review full chart

## 2021-10-21 ENCOUNTER — Encounter (HOSPITAL_BASED_OUTPATIENT_CLINIC_OR_DEPARTMENT_OTHER): Payer: Self-pay | Admitting: Nurse Practitioner

## 2021-11-17 ENCOUNTER — Encounter (HOSPITAL_BASED_OUTPATIENT_CLINIC_OR_DEPARTMENT_OTHER): Payer: Self-pay | Admitting: Nurse Practitioner

## 2021-11-17 DIAGNOSIS — Z630 Problems in relationship with spouse or partner: Secondary | ICD-10-CM | POA: Insufficient documentation

## 2021-11-17 NOTE — Assessment & Plan Note (Signed)
Chronic warfarin use for Afib. Closely monitored with anticoagulation clinic.

## 2021-11-17 NOTE — Assessment & Plan Note (Addendum)
Discussion of marital conflict presented today with concerns over the lack of care the patient is receiving by his spouse. I have met both Mr and Mrs. Roggenkamp together on several occassions and witnessed argumentative language and behaviors between both parties with shouting and aggressive language. Mr. Urbani has raised his voice to myself and staff during periods of frustration in past visits. I do feel that there is discord present in the marriage, however, it does not appear to be provoked by one person.   Mr. Pitstick endorses feeling safe in the home and denies any concerns with finances, food, or safety. I do not feel that Mr. Riojas is incapable of independently ensuring basic hygiene and food safety precautions are met and he denies that necessities are being withheld from him.

## 2021-11-17 NOTE — Assessment & Plan Note (Signed)
Multiple discussions have been held over the diagnosis of Gilbert's syndrome at past visits.Unfortunately, Jason Day does not feel that the information he has been presented with has been enough. It is unclear if this is misunderstanding or something else.  At this time he has no evidence of jaundice. Labs are pending for monitoring of bilirubin.  In some instances, IBS is a symptom experienced with this condition, therefore, it is possible this could be contributing to the diarrhea. Recommend increased fiber intake with use of Metamucil (or similar) 3-4 times a day to help add bulk to stool. Imodium may be used for intermittent periods of diarrhea that may present.

## 2021-11-17 NOTE — Assessment & Plan Note (Addendum)
History of cholecystectomy with chronic diarrhea immediately after intake. Patient had good success with bile acid sequestrants last year, however, he stopped these for unknown reasons at this time he declines restarting these.  I do feel that his limited oral intake and diarrhea are contributing significantly to his weight loss. We have discussed this at length at each visit and chart review shows this has been addressed by other providers as well. He becomes visibly frustrated when this is brought up, but continues to demand a solution to his diarrhea. The situation is quite difficult as he does not appear to understand or possibly want to understand that these recommendations are to help the problem.  I recommend restart of the BAS and intermittent immodium use to help control symptoms at this time. He is not experiencing any signs of infectious origin such as nausea, vomiting, fevers, or pain. He has declined offer for further GI evaluation and prescriptions for recommended medications today.

## 2021-11-17 NOTE — Assessment & Plan Note (Addendum)
Review of weight shows  appx 170lb from 10/2019 to 12/2020 then appx 160lb from 12/2020-06/2021. Today he is 153lb. Total weight loss of 17lb since Fall 2021.  At this time he does appear thin, but his strength is intact. He does not show signs of protein calorie malnutrition on labs. He acknowledges purposeful reduction of intake to avoid diarrhea.  I have recommended supplements of boost/ensure, use of bile acid sequestrants, and use of imodium to help increase caloric intake and reduce diarrhea. For unknown reasons he has been resistant to these suggestions. I have resuggested these again today.  At this time there is no evidence of infection, malabsorption, or starvation present. I do feel that his weight changes are provoked by his lack of intake to avoid loose stools.  I am at a loss of how to help him at this point. He does not seem to want to take the medical advice I am providing. We will recheck labs again today to ensure nothing has changed and test the stool sample he brought to ensure that there are no other signs that may lead [place] to another solution.

## 2021-11-17 NOTE — Assessment & Plan Note (Addendum)
Jason Day has historically shown evidence of mild memory changes but not to the extent of requiring supervision or not being permitted independence. Today he is able to articulate his concerns and his remote and recent memory appear fairly well intact. He is easy to anger and becomes visibly frustrated when he does not get the answers he wants.   I do believe that he is able to care for his basic necessities, but further cognitive evaluation by neuropsychology would be needed to completely assess his current status and abilities to be completely independent. At this time I recommend, if he chooses to move from his home, he look into a retirement community that provides assisted living arrangements. I will be happy to send a referral to neuropsychiatry for further evaluation.

## 2021-11-17 NOTE — Assessment & Plan Note (Signed)
Chronic. No alarm sx present. No LE edema. Followed closely by cardiology. No changes

## 2021-11-17 NOTE — Assessment & Plan Note (Signed)
Chronic. Follows with cardiology closely. Chronic anticoagulant. No changes

## 2021-11-17 NOTE — Assessment & Plan Note (Signed)
Chronic. Irregular rhythm with normal rate today. On chronic anticoagulation. No alarm sx present. Continue current treatment and close monitoring with cardiology.

## 2021-11-23 ENCOUNTER — Other Ambulatory Visit: Payer: Self-pay | Admitting: Cardiovascular Disease

## 2021-12-14 ENCOUNTER — Ambulatory Visit: Payer: HMO | Attending: Cardiovascular Disease | Admitting: *Deleted

## 2021-12-14 DIAGNOSIS — Z5181 Encounter for therapeutic drug level monitoring: Secondary | ICD-10-CM | POA: Diagnosis not present

## 2021-12-14 DIAGNOSIS — Z951 Presence of aortocoronary bypass graft: Secondary | ICD-10-CM | POA: Diagnosis not present

## 2021-12-14 LAB — POCT INR: INR: 2.1 (ref 2.0–3.0)

## 2021-12-14 NOTE — Patient Instructions (Signed)
Description   Today take 1.5 tablets then continue taking Warfarin 1 tablet daily. Recheck INR in 10 weeks per pt request (Pt aware of risk). Call coumadin clinic for any changes in medications or upcoming procedures. [phone] or [phone]

## 2021-12-21 ENCOUNTER — Telehealth: Payer: Self-pay | Admitting: Cardiovascular Disease

## 2021-12-21 NOTE — Telephone Encounter (Signed)
Called patient to let him know he does not need antibiotics for dental work. Patient verbalized understanding.

## 2021-12-21 NOTE — Telephone Encounter (Signed)
Patient called wanting to know if he needs to be on antibiotics to get a deep cleaning of his teeth done.

## 2022-01-25 NOTE — Progress Notes (Signed)
Cardiology Office Note    Date:  02/05/2022   ID:  Zenon Leaf, DOB Dec 12, 1940, MRN [id number]  PCP:  Orma Render, NP  Cardiologist:  Dr. Johnsie Cancel   Chief Complaint: Edema / CHF   History of Present Illness:   Jason Day is a 81 y.o. male CAD s/p CABG, ICM, PAF,  MR and HLD seen in f/u   He initially presented 09/2016 w/ acute pulmonary edema and diuresed. Echo showed reduced LVEF, down to 30%, leading to LHC, which showed severe multivessel CAD. 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 Post of afib >> on amiodarone and coumadin. He is claustrophobic and cannot have MRI for EF calculation  He has had recurrent afib and failed medical Rx with amiodarone LA 51 mm diameter on TTE Seen by [facility] 12/25/18 and ablation deferred plan for rate control and anticoagulation strategy Xarelto too expensive on coumadin now   Last echo  10/05/20 with EF 30-35% moderate bi atrial enlargement moderate to severe MR   The patient had L rotator cuff arthropathy 08/21/18.   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% moderate bi atrial enlargement moderate to severe MR   Seeing [name] as primary now   Needs help getting Xarelto patient assistance done Weight is down quite a bit   Past Medical History:  Diagnosis Date   Acute combined systolic and diastolic congestive heart failure ([facility]) 10/16/2016   Allergic rhinitis 09/01/2020   Bleeding hemorrhoids    Chronic combined systolic  and diastolic heart failure (HCC)    Chronic sinus complaints    overuses afrin   Coronary atherosclerosis of native coronary artery    Dyspnea    Encounter for medical examination to establish care 04/22/2017   Hyperlipidemia    Irritable bowel syndrome 06/07/2017   Ischemic cardiomyopathy    Left atrial enlargement    Long term (current) use of anticoagulants 12/30/2020   Moderate mitral regurgitation    Myocardial infarction ([facility]) 09/2015   Persistent atrial fibrillation (HCC)    RBBB    A- Fib   Shortness of breath 12/29/2020   Urinary incontinence 12/30/2020    Past Surgical History:  Procedure Laterality Date   BACK SURGERY  ~2014   disc repair   CARDIOVERSION N/A 10/24/2018   Procedure: CARDIOVERSION;  Surgeon: Jerline Pain, MD;  Location: [facility];  Service: Cardiovascular;  Laterality: N/A;   CHOLECYSTECTOMY     COLONOSCOPY  04/06/2014   Hemorrhoids and diverticulosis performed in [place] N/A 08/04/2019   Procedure: COLONOSCOPY WITH PROPOFOL;  Surgeon: Wonda Horner, MD;  Location: [facility];  Service: Endoscopy;  Laterality: N/A;   CORONARY ARTERY BYPASS GRAFT N/A 10/18/2016   Procedure: CORONARY ARTERY BYPASS GRAFTING (CABG) x five , using left internal mammary artery and right leg greater saphenous vein harvested endoscopically;  Surgeon: Gaye Pollack, MD;  Location: [facility];  Service: Open Heart Surgery;  Laterality: N/A;  ESOPHAGOGASTRODUODENOSCOPY (EGD) WITH PROPOFOL N/A 08/04/2019   Procedure: ESOPHAGOGASTRODUODENOSCOPY (EGD) WITH PROPOFOL;  Surgeon: Wonda Horner, MD;  [facility];  Service: Endoscopy;  Laterality: N/A;   FOOT SURGERY     HEMORRHOID BANDING     HEMORRHOID SURGERY     REVERSE SHOULDER ARTHROPLASTY Left 08/21/2018   Procedure: REVERSE SHOULDER ARTHROPLASTY;  Surgeon: Justice Britain, MD;  Location: [facility];  Service: Orthopedics;  Laterality: Left;   RIGHT/LEFT HEART CATH AND CORONARY ANGIOGRAPHY N/A  10/16/2016   Procedure: RIGHT/LEFT HEART CATH AND CORONARY ANGIOGRAPHY;  Surgeon: Belva Crome, MD;  Location: [facility];  Service: Cardiovascular;  Laterality: N/A;   stents in liver     TEE WITHOUT CARDIOVERSION N/A 10/18/2016   Procedure: TRANSESOPHAGEAL ECHOCARDIOGRAM (TEE);  Surgeon: Gaye Pollack, MD;  Location: [facility];  Service: Open Heart Surgery;  Laterality: N/A;   TRANSURETHRAL RESECTION OF PROSTATE N/A 11/19/2019   Procedure: CYSTOSCOPY TRANSURETHRAL RESECTION OF THE PROSTATE (TURP);  Surgeon: [name], MD;  Location: [facility];  Service: Urology;  Laterality: N/A;    Current Medications:  Prior to Admission medications   Medication Sig Start Date End Date Taking? Authorizing Provider  atorvastatin (LIPITOR) 20 MG tablet Take 0.5 tablets (10 mg total) by mouth daily. Patient taking differently: Take 10 mg by mouth every evening.  07/29/18  Yes Josue Hector, MD  digoxin (LANOXIN) 0.125 MG tablet Take 0.5 tablets (62.5 mcg total) by mouth daily. 06/26/18  Yes Josue Hector, MD  metoprolol succinate (TOPROL-XL) 25 MG 24 hr tablet Take 0.5 tablets (12.5 mg total) by mouth daily. Patient taking differently: Take 12.5 mg by mouth at bedtime.  06/26/18  Yes Josue Hector, MD  sacubitril-valsartan (ENTRESTO) 49-51 MG Take 1 tablet by mouth 2 (two) times daily. 07/29/18  Yes Josue Hector, MD  spironolactone (ALDACTONE) 25 MG tablet Take 0.5 tablets (12.5 mg total) by mouth daily. Patient taking differently: Take 12.5 mg by mouth every evening.  06/26/18  Yes Josue Hector, MD  warfarin (COUMADIN) 5 MG tablet Take as directed by [facility] Patient taking differently: Take 5 mg by mouth every evening. Take as directed by [facility] 06/26/18  Yes Josue Hector, MD   Allergies:   Patient has no known allergies.   Social History   Socioeconomic History   Marital status: Married    Spouse name: Not on file   Number of children: 2   Years of education: Not on file    Highest education level: Not on file  Occupational History   Occupation: [employer]: Retired  Tobacco Use   Smoking status: Former    Packs/day: 0.50    Types: Cigarettes    Quit date: 1970    Years since quitting: 53.9   Smokeless tobacco: Never   Tobacco comments:    Smoked on and off  Scientific laboratory technician Use: Never used  Substance and Sexual Activity   Alcohol use: No   Drug use: No   Sexual activity: Yes  Other Topics Concern   Not on file  Social History Narrative   Married    Originally from [place] moved to [place] age 48 moved to [place]   1 son Yobany Vroom in [place]   1dtr [place] in [place]       Has living will   Wife is health care POA--then son   Would accept resuscitation  No tube feeds if cognitively unaware   Social Determinants of Health   Financial Resource Strain: Not on file  Food Insecurity: Not on file  Transportation Needs: Not on file  Physical Activity: Not on file  Stress: Not on file  Social Connections: Not on file     Family History:  The patient's family history includes COPD in his father; Emphysema in his father; Healthy in his brother.   ROS:   Please see the history of present illness.    ROS All other systems reviewed and are negative.   PHYSICAL EXAM:   VS:  BP (!) 98/48   Pulse (!) 46   Ht 6' (1.829 m)   Wt 159 lb (72.1 kg)   SpO2 (!) 89%   BMI 21.56 kg/m     Argumentative  Chronically ill male HEENT: normal Neck supple with no adenopathy JVP normal no bruits no thyromegaly Lungs clear with no wheezing and good diaphragmatic motion Heart:  S1/S2 apical MR murmur, no rub, gallop or click PMI  Enlarged  post sternotomy  Abdomen: benighn, BS positve, no tenderness, no AAA no bruit.  No HSM or HJR Foley catheter strapped to right leg  Distal pulses intact with no bruits Plus one edema bad varicosities bilaterally chronic stasis  Neuro non-focal Skin warm and dry No  muscular weakness   Wt Readings from Last 3 Encounters:  02/05/22 159 lb (72.1 kg)  10/12/21 153 lb (69.4 kg)  06/28/21 157 lb 3.2 oz (71.3 kg)      Studies/Labs Reviewed:   EKG:  9/10/21afib RBBB LAD old IMI rate 66   Recent Labs: 10/12/2021: ALT 14; BUN 22; Creatinine, Ser 1.24; Hemoglobin 14.7; Magnesium 2.0; Platelets 110; Potassium 5.0; Sodium 142; TSH 1.960   Lipid Panel    Component Value Date/Time   CHOL 103 10/12/2021 1027   TRIG 61 10/12/2021 1027   HDL 44 10/12/2021 1027   CHOLHDL 2.3 10/12/2021 1027   CHOLHDL 6 04/22/2017 1431   VLDL 9 10/15/2016 1155   LDLCALC 45 10/12/2021 1027   LDLDIRECT 119.0 04/22/2017 1431    Additional studies/ records that were reviewed today include:   Echo 10/05/20 IMPRESSIONS     1. Left ventricular ejection fraction, by estimation, is 30 to 35%. The  left ventricle has moderately decreased function. The left ventricle  demonstrates global hypokinesis. Left ventricular diastolic parameters are  consistent with Grade II diastolic  dysfunction (pseudonormalization).   2. Right ventricular systolic function is normal. The right ventricular  size is normal. There is mildly elevated pulmonary artery systolic  pressure.   3. Left atrial size was moderately dilated.   4. Right atrial size was moderately dilated.   5. The mitral valve is normal in structure. Moderate to severe mitral  valve regurgitation. No evidence of mitral stenosis.   6. The aortic valve is tricuspid. Aortic valve regurgitation is not  visualized. Mild to moderate aortic valve sclerosis/calcification is  present, without any evidence of aortic stenosis.   7. The inferior vena cava is dilated in size with <50% respiratory  variability, suggesting right atrial pressure of 15 mmHg.   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 Similar size as prior.      ASSESSMENT & PLAN:    1. Acute on chronic combined CHF  - LVEF  of 30-35% with moderate to severe MR  Both Dr Rayann Heman and Quentin Ore deferred CRT pacing given age and narrow RBBB Euvolemic on  good medical Rx Getting assistance with Entresto   2. CAD s/p CABG -Recent EKG with nonspecific changes.  Patient denies any anginal symptoms.  Should be on 81 mg ASA   3 Chronic Afib  - on coumadin , Xarelto too expensive Failed cardioversion and medical Rx with amiodarone Seen by Dr Rayann Heman 12/25/18 and ablation deferred adopted strategy of rate control and anticoagulation LA dimension by echo 51 mm INR Rx 2.1 12/14/21    5.  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 Edema:  Discussed seeing vein doctors at [facility] Compression stockings and diuretic with elevation of legs   7. Urology:  F/u post cystoscopy / foley out   8. Behavioral:  needs to f/u with primary as he has dementia with behavioral issues that are getting worse. Discussed taking to primary about appetite stimulant as well Suggested Boost supplement   Medication Adjustments/Labs and Tests Ordered:  None-  long discussion about risk of recurrent CHF given CAD/ischemic DCM/ischemic MR And LE venous varicosities   Update TTE  Labs BMET/BNP/Dig level   F/U in 6 months   Signed, Jenkins Rouge, MD  02/05/2022 9:26 AM    [facility] [address], [place], [place]  [address] Phone: [phone]; Fax: [phone]

## 2022-02-05 ENCOUNTER — Encounter: Payer: Self-pay | Admitting: Cardiovascular Disease

## 2022-02-05 ENCOUNTER — Ambulatory Visit: Payer: HMO | Attending: Cardiovascular Disease | Admitting: Cardiovascular Disease

## 2022-02-05 VITALS — BP 98/48 | HR 46 | Ht 72.0 in | Wt 159.0 lb

## 2022-02-05 DIAGNOSIS — I420 Dilated cardiomyopathy: Secondary | ICD-10-CM | POA: Diagnosis not present

## 2022-02-05 DIAGNOSIS — I340 Nonrheumatic mitral (valve) insufficiency: Secondary | ICD-10-CM | POA: Diagnosis not present

## 2022-02-05 NOTE — Patient Instructions (Signed)
Medication Instructions:  Your physician recommends that you continue on your current medications as directed. Please refer to the Current Medication list given to you today.  *If you need a refill on your cardiac medications before your next appointment, please call your pharmacy*  Lab Work: Your physician recommends that you have lab work today- BMET, BNP, Dig Level  If you have labs (blood work) drawn today and your tests are completely normal, you will receive your results only by: MyChart Message (if you have MyChart) OR A paper copy in the mail If you have any lab test that is abnormal or we need to change your treatment, we will call you to review the results.   Testing/Procedures: Your physician has requested that you have an echocardiogram. Echocardiography is a painless test that uses sound waves to create images of your heart. It provides your doctor with information about the size and shape of your heart and how well your heart's chambers and valves are working. This procedure takes approximately one hour. There are no restrictions for this procedure. Please do NOT wear cologne, perfume, aftershave, or lotions (deodorant is allowed). Please arrive 15 minutes prior to your appointment time.  Follow-Up: At [facility], you and your health needs are our priority.  As part of our continuing mission to provide you with exceptional heart care, we have created designated Provider Care Teams.  These Care Teams include your primary Cardiologist (physician) and Advanced Practice Providers (APPs -  Physician Assistants and Nurse Practitioners) who all work together to provide you with the care you need, when you need it.  We recommend signing up for the patient portal called "MyChart".  Sign up information is provided on this After Visit Summary.  MyChart is used to connect with patients for Virtual Visits (Telemedicine).  Patients are able to view lab/test results, encounter notes,  upcoming appointments, etc.  Non-urgent messages can be sent to your provider as well.   To learn more about what you can do with MyChart, go to [email or web address].    Your next appointment:   6 month(s)  The format for your next appointment:   In Person  Provider:   Jenkins Rouge, MD       Important Information About Sugar

## 2022-02-06 ENCOUNTER — Telehealth: Payer: Self-pay | Admitting: Cardiovascular Disease

## 2022-02-06 DIAGNOSIS — I420 Dilated cardiomyopathy: Secondary | ICD-10-CM

## 2022-02-06 DIAGNOSIS — I5042 Chronic combined systolic (congestive) and diastolic (congestive) heart failure: Secondary | ICD-10-CM

## 2022-02-06 LAB — BASIC METABOLIC PANEL
BUN/Creatinine Ratio: 19 (ref 10–24)
BUN: 23 mg/dL (ref 8–27)
CO2: 27 mmol/L (ref 20–29)
Calcium: 9.1 mg/dL (ref 8.6–10.2)
Chloride: 103 mmol/L (ref 96–106)
Creatinine, Ser: 1.21 mg/dL (ref 0.76–1.27)
Glucose: 89 mg/dL (ref 70–99)
Potassium: 4.2 mmol/L (ref 3.5–5.2)
Sodium: 141 mmol/L (ref 134–144)
eGFR: 60 mL/min/{1.73_m2} (ref >59)

## 2022-02-06 LAB — DIGITOXIN LEVEL

## 2022-02-06 LAB — SPECIMEN STATUS REPORT

## 2022-02-06 LAB — PRO B NATRIURETIC PEPTIDE: NT-Pro BNP: 3420 pg/mL — ABNORMAL HIGH (ref 0–486)

## 2022-02-06 NOTE — Telephone Encounter (Signed)
Called patient back with results. Patient stated he has not been taking his lasix. Encouraged patient to start taking his lasix daily. Will see if patient needs BMET again in a week or two since he has not been taking the lasix.

## 2022-02-06 NOTE — Addendum Note (Signed)
Addended by: Aris Georgia, Averill Pons L on: 02/06/2022 10:52 AM   Modules accepted: Orders

## 2022-02-06 NOTE — Telephone Encounter (Signed)
Pt returning call for lab results  

## 2022-02-07 NOTE — Telephone Encounter (Signed)
Called patient with Dr. Kyla Balzarine recommendations. Patient will come in on 02/21/22 for BMET and digoxin level if not done by then.

## 2022-02-10 ENCOUNTER — Other Ambulatory Visit: Payer: Self-pay | Admitting: Cardiovascular Disease

## 2022-02-11 ENCOUNTER — Emergency Department (HOSPITAL_COMMUNITY)
Admission: EM | Admit: 2022-02-11 | Discharge: 2022-02-11 | Disposition: A | Discharge status: Home / Self Care | Payer: HMO | Attending: Emergency Medicine | Admitting: Emergency Medicine

## 2022-02-11 ENCOUNTER — Emergency Department (HOSPITAL_COMMUNITY): Payer: HMO

## 2022-02-11 ENCOUNTER — Other Ambulatory Visit: Payer: Self-pay | Admitting: Physician Assistant

## 2022-02-11 DIAGNOSIS — R55 Syncope and collapse: Secondary | ICD-10-CM | POA: Diagnosis not present

## 2022-02-11 DIAGNOSIS — Z7901 Long term (current) use of anticoagulants: Secondary | ICD-10-CM | POA: Insufficient documentation

## 2022-02-11 DIAGNOSIS — I4891 Unspecified atrial fibrillation: Secondary | ICD-10-CM | POA: Diagnosis not present

## 2022-02-11 DIAGNOSIS — R944 Abnormal results of kidney function studies: Secondary | ICD-10-CM | POA: Insufficient documentation

## 2022-02-11 DIAGNOSIS — R42 Dizziness and giddiness: Secondary | ICD-10-CM | POA: Diagnosis present

## 2022-02-11 DIAGNOSIS — Z79899 Other long term (current) drug therapy: Secondary | ICD-10-CM | POA: Insufficient documentation

## 2022-02-11 DIAGNOSIS — I5042 Chronic combined systolic (congestive) and diastolic (congestive) heart failure: Secondary | ICD-10-CM | POA: Diagnosis not present

## 2022-02-11 DIAGNOSIS — R001 Bradycardia, unspecified: Secondary | ICD-10-CM | POA: Diagnosis not present

## 2022-02-11 LAB — CBC WITH DIFFERENTIAL/PLATELET
Abs Immature Granulocytes: 0 10*3/uL (ref 0.00–0.07)
Basophils Absolute: 0 10*3/uL (ref 0.0–0.1)
Basophils Relative: 0 %
Eosinophils Absolute: 0.1 10*3/uL (ref 0.0–0.5)
Eosinophils Relative: 2 %
HCT: 41.3 % (ref 39.0–52.0)
Hemoglobin: 13.5 g/dL (ref 13.0–17.0)
Immature Granulocytes: 0 %
Lymphocytes Relative: 25 %
Lymphs Abs: 0.9 10*3/uL (ref 0.7–4.0)
MCH: 31 pg (ref 26.0–34.0)
MCHC: 32.7 g/dL (ref 30.0–36.0)
MCV: 94.9 fL (ref 80.0–100.0)
Monocytes Absolute: 0.3 10*3/uL (ref 0.1–1.0)
Monocytes Relative: 9 %
Neutro Abs: 2.3 10*3/uL (ref 1.7–7.7)
Neutrophils Relative %: 64 %
Platelets: 74 10*3/uL — ABNORMAL LOW (ref 150–400)
RBC: 4.35 MIL/uL (ref 4.22–5.81)
RDW: 13.6 % (ref 11.5–15.5)
WBC: 3.6 10*3/uL — ABNORMAL LOW (ref 4.0–10.5)
nRBC: 0 % (ref 0.0–0.2)

## 2022-02-11 LAB — BASIC METABOLIC PANEL
Anion gap: 7 (ref 5–15)
BUN: 39 mg/dL — ABNORMAL HIGH (ref 8–23)
CO2: 29 mmol/L (ref 22–32)
Calcium: 8.6 mg/dL — ABNORMAL LOW (ref 8.9–10.3)
Chloride: 103 mmol/L (ref 98–111)
Creatinine, Ser: 1.43 mg/dL — ABNORMAL HIGH (ref 0.61–1.24)
GFR, Estimated: 49 mL/min — ABNORMAL LOW (ref >60)
Glucose, Bld: 97 mg/dL (ref 70–99)
Potassium: 4.7 mmol/L (ref 3.5–5.1)
Sodium: 139 mmol/L (ref 135–145)

## 2022-02-11 LAB — TROPONIN I (HIGH SENSITIVITY)
Troponin I (High Sensitivity): 52 ng/L — ABNORMAL HIGH (ref <18)
Troponin I (High Sensitivity): 4 ng/L (ref <18)

## 2022-02-11 LAB — URINALYSIS, ROUTINE W REFLEX MICROSCOPIC
Bacteria, UA: NONE SEEN
Bilirubin Urine: NEGATIVE
Glucose, UA: NEGATIVE mg/dL
Hgb urine dipstick: NEGATIVE
Ketones, ur: NEGATIVE mg/dL
Leukocytes,Ua: NEGATIVE
Nitrite: NEGATIVE
Protein, ur: NEGATIVE mg/dL
Specific Gravity, Urine: 1.006 (ref 1.005–1.030)
pH: 6 (ref 5.0–8.0)

## 2022-02-11 LAB — CBG MONITORING, ED: Glucose-Capillary: 87 mg/dL (ref 70–99)

## 2022-02-11 LAB — DIGOXIN LEVEL: Digoxin Level: 0.4 ng/mL — ABNORMAL LOW (ref 0.8–2.0)

## 2022-02-11 NOTE — ED Notes (Signed)
Pt put clothes back on and is ready to leave. NAD noted at this time.

## 2022-02-11 NOTE — ED Notes (Signed)
Pt does not have pacemaker

## 2022-02-11 NOTE — Discharge Instructions (Addendum)
You were seen today due to a syncopal episode. Please discontinue your digoxin and decrease your lasix usage. Please follow up with cardiology. If you experience further syncopal episodes or other life-threatening symptoms such as chest pain please return to the emergency department.

## 2022-02-11 NOTE — ED Provider Notes (Signed)
[facility] EMERGENCY DEPARTMENT Provider Note   CSN: [id number] Arrival date & time: 02/11/22  7672     History  Chief Complaint  Patient presents with   Loss of Consciousness    Jason Day is a 81 y.o. male.  Patient presents emergency department via EMS complaining of a syncopal episode.  Patient was eating breakfast when his wife noticed him to doze off.  The syncopal episode reportedly lasted for a matter of seconds.  The patient did not fall.  He reports being somewhat lightheaded just prior to the episode.  The patient is currently conscious alert and oriented x 4.  He has no other distress at this time.  He denies chest pain, shortness of breath, headache, lightheadedness, dizziness, abdominal pain, nausea, vomiting.  Patient does have extensive cardiac history including CABG, atrial fibrillation, chronic combined systolic and diastolic heart failure, mitral regurgitation, memory impairment  HPI     Home Medications Prior to Admission medications   Medication Sig Start Date End Date Taking? Authorizing Provider  atorvastatin (LIPITOR) 20 MG tablet TAKE 1/2 TABLET BY MOUTH EVERY DAY 11/23/21   Josue Hector, MD  ENTRESTO 49-51 MG TAKE 1 TABLET BY MOUTH TWICE A DAY 05/29/21   Josue Hector, MD  furosemide (LASIX) 40 MG tablet TAKE 1 TABLET BY MOUTH EVERY DAY Patient taking differently: Take 40 mg by mouth daily as needed. 09/04/21   Josue Hector, MD  metoprolol succinate (TOPROL-XL) 25 MG 24 hr tablet TAKE 1/2 TABLET BY MOUTH EVERY DAY 10/03/21   Josue Hector, MD  oxymetazoline (AFRIN) 0.05 % nasal spray Place 1 spray into both nostrils at bedtime.    [provider]  spironolactone (ALDACTONE) 25 MG tablet TAKE 1 TABLET (25 MG TOTAL) BY MOUTH DAILY. 09/28/21   Josue Hector, MD  warfarin (COUMADIN) 5 MG tablet TAKE 1 TABLET BY MOUTH DAILY AS DIRECTED BY THE COUMADIN CLINIC. 05/22/21   Josue Hector, MD      Allergies    Patient has no known  allergies.    Review of Systems   Review of Systems  Constitutional:  Negative for fever.  Respiratory:  Negative for shortness of breath.   Cardiovascular:  Negative for chest pain and palpitations.  Gastrointestinal:  Negative for abdominal pain, nausea and vomiting.  Genitourinary:  Negative for dysuria.  Neurological:  Positive for syncope. Negative for dizziness and light-headedness.    Physical Exam Updated Vital Signs BP 129/74   Pulse 64   Temp 98.1 F (36.7 C) (Oral)   Resp (!) 21   Ht 6' (1.829 m)   Wt 70.3 kg   SpO2 100%   BMI 21.02 kg/m  Physical Exam Vitals and nursing note reviewed.  Constitutional:      General: He is not in acute distress.    Appearance: He is well-developed.  HENT:     Head: Normocephalic and atraumatic.     Mouth/Throat:     Mouth: Mucous membranes are moist.  Eyes:     Conjunctiva/sclera: Conjunctivae normal.  Cardiovascular:     Rate and Rhythm: Regular rhythm. Bradycardia present.     Pulses: Normal pulses.  Pulmonary:     Effort: Pulmonary effort is normal. No respiratory distress.     Breath sounds: Normal breath sounds.  Abdominal:     Palpations: Abdomen is soft.     Tenderness: There is no abdominal tenderness.  Musculoskeletal:        General: No  swelling.     Cervical back: Neck supple.  Skin:    General: Skin is warm and dry.     Capillary Refill: Capillary refill takes less than 2 seconds.  Neurological:     Mental Status: He is alert.  Psychiatric:        Mood and Affect: Mood normal.     ED Results / Procedures / Treatments   Labs (all labs ordered are listed, but only abnormal results are displayed) Labs Reviewed  BASIC METABOLIC PANEL - Abnormal; Notable for the following components:      Result Value   BUN 39 (*)    Creatinine, Ser 1.43 (*)    Calcium 8.6 (*)    GFR, Estimated 49 (*)    All other components within normal limits  CBC WITH DIFFERENTIAL/PLATELET - Abnormal; Notable for the following  components:   WBC 3.6 (*)    Platelets 74 (*)    All other components within normal limits  URINALYSIS, ROUTINE W REFLEX MICROSCOPIC - Abnormal; Notable for the following components:   Color, Urine STRAW (*)    All other components within normal limits  DIGOXIN LEVEL - Abnormal; Notable for the following components:   Digoxin Level 0.4 (*)    All other components within normal limits  TROPONIN I (HIGH SENSITIVITY) - Abnormal; Notable for the following components:   Troponin I (High Sensitivity) 52 (*)    All other components within normal limits  CBG MONITORING, ED  TROPONIN I (HIGH SENSITIVITY)    EKG EKG Interpretation  Date/Time:  Sunday February 11 2022 09:38:55 EST Ventricular Rate:  51 PR Interval:    QRS Duration: 156 QT Interval:  474 QTC Calculation: 437 R Axis:   -72 Text Interpretation: Atrial fibrillation Right bundle branch block LVH with IVCD and secondary repol abnrm Inferior infarct, acute >>> Acute MI <<< amplitude is increased in multiple leads, proportion of ST changes is otherwise similar to 2020 likely related to LVH. Confirmed by Merrily Pew [phone]) on 02/11/2022 9:50:32 AM  Radiology CT Head Wo Contrast  Result Date: 02/11/2022 CLINICAL DATA:  Mental status change with unknown cause EXAM: CT HEAD WITHOUT CONTRAST TECHNIQUE: Contiguous axial images were obtained from the base of the skull through the vertex without intravenous contrast. RADIATION DOSE REDUCTION: This exam was performed according to the departmental dose-optimization program which includes automated exposure control, adjustment of the mA and/or kV according to patient size and/or use of iterative reconstruction technique. COMPARISON:  None Available. FINDINGS: Brain: No evidence of acute infarction, hemorrhage, hydrocephalus, extra-axial collection or mass lesion/mass effect. Generalized atrophy and chronic small vessel ischemia. Vascular: No hyperdense vessel or unexpected calcification.  Skull: Normal. Negative for fracture or focal lesion. Sinuses/Orbits: No acute finding.  Bilateral cataract resection. IMPRESSION: Involutional changes without superimposed acute or reversible finding. Electronically Signed   By: Jorje Guild M.D.   On: 02/11/2022 10:58   DG Chest Portable 1 View  Result Date: 02/11/2022 CLINICAL DATA:  Syncope EXAM: PORTABLE CHEST 1 VIEW COMPARISON:  Radiograph 11/21/2016 FINDINGS: Unchanged mild cardiomegaly with prior median sternotomy and postsurgical changes of CABG. There is no focal airspace consolidation. There are skin folds overlying the left lower chest and right apex. No pleural effusion or evidence of pneumothorax. Thoracic spondylosis. Left reverse total shoulder arthroplasty. No acute osseous abnormality. IMPRESSION: Unchanged mild cardiomegaly.  No focal airspace consolidation. Electronically Signed   By: Maurine Simmering M.D.   On: 02/11/2022 10:31    Procedures Procedures  Medications Ordered in ED Medications - No data to display  ED Course/ Medical Decision Making/ A&P                           Medical Decision Making Amount and/or Complexity of Data Reviewed Labs: ordered. Radiology: ordered.   This patient presents to the ED for concern of syncope, this involves an extensive number of treatment options, and is a complaint that carries with it a high risk of complications and morbidity.  The differential diagnosis includes dysrhythmia, ACS, vasovagal response, dehydration/orthostatic changes, CVA, and others   Co morbidities that complicate the patient evaluation  History of previous CABG, previous bradycardia, atrial fibrillation   Additional history obtained:  Additional history obtained from EMS External records from outside source obtained and reviewed including cardiology notes from December 18, acute on chronic CHF, mitral regurgitation  Lab Tests:  I Ordered, and personally interpreted labs.  The pertinent results  include: Mildly elevated creatinine 1.43, WBC 3.6, unremarkable UA   Imaging Studies ordered:  I ordered imaging studies including chest x-ray and head CT I independently visualized and interpreted imaging which showed Unchanged mild cardiomegaly.  No focal airspace consolidation.  Involutional changes without superimposed acute or reversible  finding.   I agree with the radiologist interpretation   Cardiac Monitoring: / EKG:  The patient was maintained on a cardiac monitor.  I personally viewed and interpreted the cardiac monitored which showed an underlying rhythm of: Sinus bradycardia   Consultations Obtained:  I requested consultation with the cardiology team,  and discussed lab and imaging findings as well as pertinent plan - they recommend: Discontinuing digoxin.  Recommend the patient pay close attention to amount of Lasix, possibly reduce dose.  Plan on sending patient to monitor for outpatient monitoring with follow-up with Dr.Nishen   Social Determinants of Health:  Patient lives at home with his wife   Test / Admission - Considered:  Patient care being transferred to Evlyn Courier, PA-C at shift handoff.  Patient has mildly elevated creatinine levels concerning for some level of overdiuresis.  Patient just this week began taking his furosemide daily instead of just as needed.  He states in the past he would only take it once or twice a week.  Patient instructed to temper his furosemide use for the time being and to stop his digoxin at recommendations of Dr. Harl Bowie.  Cardiac monitor is being mailed to the patient to be used prior to his follow-up with cardiology.  Disposition pending troponin.         Final Clinical Impression(s) / ED Diagnoses Final diagnoses:  Syncope, unspecified syncope type    Rx / DC Orders ED Discharge Orders     None         Ronny Bacon 02/11/22 1523    Mesner, Corene Cornea, MD 02/18/22 [phone]

## 2022-02-11 NOTE — ED Notes (Signed)
Pt left prior to receiving discharge instructions and was called to view discharge instructions on mychart.

## 2022-02-11 NOTE — ED Triage Notes (Addendum)
Pt BIB EMS from home. Pt was eating breakfast when he doze off and left arm started to tremor- lasting for seconds. Pt did not fall. Pt rpeorts significant CV history. A&Ox4 ABCs intact at this time. Pt denies SOB & CP.

## 2022-02-11 NOTE — ED Notes (Signed)
NAD noted, ABCs intact at this time. Pt sitting in chair watching TV

## 2022-02-11 NOTE — ED Notes (Signed)
Orthostatic Vitals below. PT denies lightheadedness upon changing positions.    02/11/22 1505 02/11/22 1508 02/11/22 1510  Vitals  BP 125/74 118/74 129/74  MAP (mmHg) 87 89 87  Patient Position (if appropriate) Lying Sitting Standing  Pulse Rate (!) 58 (!) 52 64

## 2022-02-11 NOTE — ED Provider Notes (Signed)
Signout received illness 81 year old male who presented with syncopal episode.  His medical history is significant for persistent A-fib, ischemic cardiomyopathy, CAD.  See note from Eda Keys for additional history.  Patient's case was discussed with cardiologist who recommended discontinuing digoxin, and adjusted patient's Lasix dose.  From cardiology standpoint patient was appropriate for discharge.  At the time of signout patient is awaiting his troponin and orthostatic results. Physical Exam  BP 129/74   Pulse 64   Temp 98.1 F (36.7 C) (Oral)   Resp (!) 21   Ht 6' (1.829 m)   Wt 70.3 kg   SpO2 100%   BMI 21.02 kg/m   Physical Exam  Procedures  Procedures  ED Course / MDM    Medical Decision Making Amount and/or Complexity of Data Reviewed Labs: ordered. Radiology: ordered.   Patient's orthostatics were within normal.  Initial troponin of 52, repeat of 4.  He remained without chest pain.  Low suspicion for ACS.  Patient is appropriate for discharge.  Discharged in stable condition.  Discussed follow-up with his primary cardiologist.       Evlyn Courier, PA-C 02/11/22 [place], [place], DO 02/11/22 1702

## 2022-02-13 ENCOUNTER — Other Ambulatory Visit (INDEPENDENT_AMBULATORY_CARE_PROVIDER_SITE_OTHER): Payer: HMO

## 2022-02-13 DIAGNOSIS — R55 Syncope and collapse: Secondary | ICD-10-CM

## 2022-02-13 NOTE — Progress Notes (Unsigned)
Enrolled patient for a 14 day Zio AT monitor to be mailed to patients home .  [id number] mailed to patient 02/13/22, applied in office 02/15/22.  Dr Johnsie Cancel to read

## 2022-02-14 ENCOUNTER — Telehealth: Payer: Self-pay | Admitting: Cardiovascular Disease

## 2022-02-14 LAB — SPECIMEN STATUS REPORT

## 2022-02-14 NOTE — Telephone Encounter (Signed)
New Message:     Patient wants to know if he really need the Zio Monitor? He says if he does, he need someone to help him with this please.

## 2022-02-14 NOTE — Telephone Encounter (Signed)
Called patient back. Made patient an appointment to see monitor tech tomorrow to help place zio patch on.

## 2022-02-15 ENCOUNTER — Telehealth: Payer: Self-pay | Admitting: Cardiology

## 2022-02-15 ENCOUNTER — Ambulatory Visit: Payer: HMO | Attending: Internal Medicine

## 2022-02-15 DIAGNOSIS — R55 Syncope and collapse: Secondary | ICD-10-CM

## 2022-02-15 NOTE — Telephone Encounter (Signed)
   Jason Day was recently seen in the ED on 12/24 following a syncopal episode at home. As a part of further workup, a Zio AT monitor was ordered and was placed on the patient on 02/15/22. We received a call from [place] [facility] this evening about the first observed episode of atrial fibrillation noted to have occurred at 03:26:47 PM. Faxed transmission of rhythm strip received. This shows continuous atrial fibrillation with tracings consistent with previous ECGs. No pauses or significant bradycardia noted. Patient has a known history of atrial fibrillation, at this point felt to be permanent with rate control strategy in place. He is anticoagulated on Warfarin.   We will continue to monitor device results.   Lily Kocher PA-C

## 2022-02-18 ENCOUNTER — Other Ambulatory Visit: Payer: Self-pay | Admitting: Cardiovascular Disease

## 2022-02-21 ENCOUNTER — Other Ambulatory Visit: Payer: Self-pay | Admitting: *Deleted

## 2022-02-21 ENCOUNTER — Ambulatory Visit: Payer: HMO | Attending: Cardiovascular Disease

## 2022-02-21 DIAGNOSIS — I420 Dilated cardiomyopathy: Secondary | ICD-10-CM

## 2022-02-21 DIAGNOSIS — I340 Nonrheumatic mitral (valve) insufficiency: Secondary | ICD-10-CM | POA: Diagnosis not present

## 2022-02-21 DIAGNOSIS — I5042 Chronic combined systolic (congestive) and diastolic (congestive) heart failure: Secondary | ICD-10-CM | POA: Diagnosis not present

## 2022-02-21 LAB — BASIC METABOLIC PANEL
BUN/Creatinine Ratio: 17 (ref 10–24)
BUN: 20 mg/dL (ref 8–27)
CO2: 27 mmol/L (ref 20–29)
Calcium: 9.2 mg/dL (ref 8.6–10.2)
Chloride: 103 mmol/L (ref 96–106)
Creatinine, Ser: 1.15 mg/dL (ref 0.76–1.27)
Glucose: 113 mg/dL — ABNORMAL HIGH (ref 70–99)
Potassium: 4.6 mmol/L (ref 3.5–5.2)
Sodium: 142 mmol/L (ref 134–144)
eGFR: 64 mL/min/{1.73_m2} (ref >59)

## 2022-02-22 ENCOUNTER — Ambulatory Visit: Payer: HMO | Attending: Cardiovascular Disease

## 2022-02-22 DIAGNOSIS — Z5181 Encounter for therapeutic drug level monitoring: Secondary | ICD-10-CM | POA: Diagnosis not present

## 2022-02-22 DIAGNOSIS — Z951 Presence of aortocoronary bypass graft: Secondary | ICD-10-CM | POA: Diagnosis not present

## 2022-02-22 LAB — POCT INR: INR: 2 (ref 2.0–3.0)

## 2022-02-22 LAB — DIGOXIN LEVEL: Digoxin, Serum: <0.4 ng/mL — ABNORMAL LOW (ref 0.5–0.9)

## 2022-02-22 NOTE — Patient Instructions (Signed)
Description   Today take 1.5 tablets then continue taking Warfarin 1 tablet daily. Recheck INR in 10 weeks per pt request (Pt aware of risk). Call coumadin clinic for any changes in medications or upcoming procedures.  Coumadin Clinic [phone]

## 2022-02-26 ENCOUNTER — Telehealth: Payer: Self-pay | Admitting: *Deleted

## 2022-02-26 NOTE — Telephone Encounter (Signed)
Patient returning call.

## 2022-02-26 NOTE — Telephone Encounter (Signed)
I spoke with patient. He had no symptoms on 1/5.  He thinks he was watching a movie at that time.  He is feeling fine today.

## 2022-02-26 NOTE — Telephone Encounter (Signed)
Strip reviewed by Dr Lynne Leader).  Continue current treatment plan.

## 2022-02-26 NOTE — Telephone Encounter (Signed)
   Cardiac Monitor Alert  Date of alert:  02/23/22   Patient Name: Jason Day  DOB: [birth year]  MRN: [id number]   [place] Cardiologist: Dr Riley [facility] EP:  none  Monitor Information: Long Term Monitor-Live Telemetry [ZioAT]  Reason:  syncope Ordering provider:  Nishan   Alert Atrial Fibrillation/Flutter ranging from 30-76 bmp. (Average of 46 bmp). Pause occurred lasting 4 seconds.  Isolated VE(s) were present.  Frankey Poot RN notified on January 6, at 12:07 AM This is the 2nd alert for this rhythm.  The patient has a hx of Atrial Fibrillation/Flutter.  The patient is currently on anticoagulation.  Anticoagulation medication as of 02/26/2022           warfarin (COUMADIN) 5 MG tablet TAKE 1 TABLET BY MOUTH DAILY AS DIRECTED BY THE COUMADIN CLINIC.       Next Cardiology Appointment   Date:  03/30/22  Provider:  Johnsie Cancel    Call placed to patient. Left message to call office.   Other:   Leodis Liverpool, RN  02/26/2022 9:09 AM

## 2022-02-28 ENCOUNTER — Telehealth: Payer: Self-pay | Admitting: Cardiovascular Disease

## 2022-02-28 NOTE — Telephone Encounter (Signed)
   Cardiac Monitor Alert  Date of alert:  02/28/2022   Patient Name: Jason Day  DOB: October 19, 1940  MRN: [id number]   [place] Cardiologist: None  [facility] EP:  Vickie Epley, MD    Monitor Information: Long Term Monitor-Live Telemetry [ZioAT]  Reason:  Syncope and Collapse  Ordering provider:  Janan Ridge, PA-C   Alert Atrial Fib with bradycardia, HR 39 for 60 seconds during sleep.   This is the  unknown number  alert for this rhythm.   Next Cardiology Appointment   Date:  03/30/2022  Provider:  Dr. Johnsie Cancel  The patient was contacted today.  He is asymptomatic. Arrhythmia, symptoms and history reviewed with DOD, Dr. Ali Lowe.   Plan:  Follow up appointment with Dr. Johnsie Cancel 03/30/2022.   Other: [employer] call / report received from Sam, for Atrial Fibrillation with bradycardia, HR 39 for 60 seconds during sleep, at 1142 pm on 02/27/2022.  Pt called and was asymptomatic and asleep at time rhythm was captured.  Report was printed from [employer], and taken to DOD, Dr. Ali Lowe for review.  Since Pt was sleeping, and asymptomatic at time of capture, Dr. Ali Lowe ordered Pt to f/u with Dr. Johnsie Cancel on 03/30/22.   Varney Daily, RN  02/28/2022 4:02 PM

## 2022-02-28 NOTE — Telephone Encounter (Signed)
Per call taken in [facility] triage from Sam of [employer] regarding abnormal heart monitor alert.   Per Sam of [place], Pt Jason Day, had Atrial Fibrillation, with bradycardia / HR of 39 BPM last night at 1142 pm on 02/27/2022 for a total of 60 Seconds.   An Alert report will be posted once this is received and will take to DOD, Dr. Ali Lowe for review.    Pt contacted at 1045 am on 02/28/2022.  Pt made aware of the Alert capture and his bradycardia; Pt stated he was asleep when this occurred, and was asymptomatic.  Pt made aware I was following up on message received from [facility], and will make our DOD aware of this once the report is received.  Pt appreciated the call.

## 2022-02-28 NOTE — Telephone Encounter (Signed)
Sam with [employer] calling with abnormal EKG results.

## 2022-03-02 ENCOUNTER — Ambulatory Visit: Payer: Self-pay | Admitting: Internal Medicine

## 2022-03-08 ENCOUNTER — Ambulatory Visit (HOSPITAL_COMMUNITY): Payer: PPO | Attending: Cardiovascular Disease

## 2022-03-08 ENCOUNTER — Telehealth: Payer: Self-pay | Admitting: Family Medicine

## 2022-03-08 ENCOUNTER — Telehealth: Payer: Self-pay | Admitting: Nurse Practitioner

## 2022-03-08 DIAGNOSIS — I340 Nonrheumatic mitral (valve) insufficiency: Secondary | ICD-10-CM | POA: Diagnosis not present

## 2022-03-08 DIAGNOSIS — I420 Dilated cardiomyopathy: Secondary | ICD-10-CM | POA: Diagnosis not present

## 2022-03-08 LAB — ECHOCARDIOGRAM COMPLETE
AR max vel: 1.82 cm2
AV Area VTI: 1.95 cm2
AV Area mean vel: 1.69 cm2
AV Mean grad: 4 mmHg
AV Peak grad: 6.9 mmHg
Ao pk vel: 1.32 m/s
S' Lateral: 5.6 cm

## 2022-03-08 NOTE — Telephone Encounter (Signed)
Reviewed form and placed in PCP's box for completion.  .Amadeo Coke R Liyla Radliff, CMA  

## 2022-03-08 NOTE — Telephone Encounter (Signed)
Patient needs Application for disability Parking Placard form completed.  His pcp is Dr Erin Hearing. Put information in red folder today.  Any questions call patient.

## 2022-03-12 ENCOUNTER — Telehealth: Payer: Self-pay | Admitting: Cardiovascular Disease

## 2022-03-12 DIAGNOSIS — I5042 Chronic combined systolic (congestive) and diastolic (congestive) heart failure: Secondary | ICD-10-CM

## 2022-03-12 NOTE — Telephone Encounter (Signed)
Patient returning Pam's call in regards to echo results

## 2022-03-13 NOTE — Telephone Encounter (Signed)
The patient has been notified of the result and verbalized understanding.  All questions (if any) were answered. Bernestine Amass, RN 03/13/2022 10:24 AM  Referral has been placed.

## 2022-03-13 NOTE — Telephone Encounter (Signed)
Form completed and placed in RN box

## 2022-03-13 NOTE — Telephone Encounter (Signed)
Pt calling back to f/u on Echo results. Please advise

## 2022-03-13 NOTE — Telephone Encounter (Signed)
Patient called and informed that forms are ready for pick up. Copy made and placed in batch scanning. Original placed at front desk for pick up.   Corissa Oguinn C Saory Carriero, RN  

## 2022-03-13 NOTE — Telephone Encounter (Signed)
-----  Message from Josue Hector, MD sent at 03/08/2022 11:54 AM EST ----- EF continues to be depressed He is on good meds refer to CHF clinic to see if anything else should be done

## 2022-03-15 ENCOUNTER — Telehealth: Payer: Self-pay | Admitting: Cardiovascular Disease

## 2022-03-15 ENCOUNTER — Telehealth: Payer: Self-pay

## 2022-03-15 DIAGNOSIS — R9431 Abnormal electrocardiogram [ECG] [EKG]: Secondary | ICD-10-CM

## 2022-03-15 DIAGNOSIS — R55 Syncope and collapse: Secondary | ICD-10-CM

## 2022-03-15 NOTE — Telephone Encounter (Signed)
-----  Message from Josue Hector, MD sent at 03/14/2022  2:31 PM EST ----- Please refer to EP ----- Message ----- From: Almyra Deforest, PA Sent: 03/14/2022   1:42 PM EST To: Josue Hector, MD; Jacqulynn Cadet, CMA  Heart monitor ordered after patient went to ED in Dec 2023 with syncope. Digoxin was stopped at the time. Still on low dose toprol XL 12.'5mg'$  daily. Prolonged pauses primarily occurred at night. However also had some tachycardic episodes. Will need to review with Dr. Johnsie Cancel on the next follow up to decide if need EP evaluation.

## 2022-03-15 NOTE — Telephone Encounter (Signed)
Patient called wanting to speak to Dr. Kyla Balzarine nurse.

## 2022-03-15 NOTE — Telephone Encounter (Signed)
Left message for patient to call back.

## 2022-03-15 NOTE — Telephone Encounter (Signed)
Called patient back. Patient is very concerned about his echo results. Patient would like nurse to call his daughter about what is going on. Patient's daughter is a Marine scientist and has been reviewing his echo results and office visit notes. Patient and his daughter are wanting him to get in with CHF clinic as soon as possible to see what options he has. Daughter is a Merchandiser, retail she is concerned that patient might be looking at palliative care sooner than later. Informed her that right now we have put in referral for CHF to see what else can be done. She would like to request the doctors see patient as soon as possible. Sent both HF doctors the results of patient's echo and request a sooner appointment.

## 2022-03-15 NOTE — Telephone Encounter (Signed)
Patient returning call.

## 2022-03-15 NOTE — Telephone Encounter (Signed)
Patient called back. Informed patient of results and the EP referral. Patient stated he saw Dr. Quentin Ore a little over a year ago and that he was wondering if this appointment is necessary. Will send message to Dr. Quentin Ore for advisement.

## 2022-03-16 ENCOUNTER — Telehealth (HOSPITAL_COMMUNITY): Payer: Self-pay

## 2022-03-16 NOTE — Telephone Encounter (Signed)
Left message for patient to call office to arrange visit with Dr. Aundra Dubin. (New Pt.)

## 2022-03-21 ENCOUNTER — Telehealth (HOSPITAL_COMMUNITY): Payer: Self-pay | Admitting: Vascular Surgery

## 2022-03-21 NOTE — Telephone Encounter (Signed)
Lvm to make new chf appt w/ [facility] next

## 2022-03-22 ENCOUNTER — Telehealth: Payer: Self-pay | Admitting: Cardiovascular Disease

## 2022-03-22 NOTE — Telephone Encounter (Signed)
I called this pt to schedule EP appt w/CL per Dr. Johnsie Cancel. Pt said he is tired of getting the run around and will wait to schedule after he is seen by Dr. Johnsie Cancel on 02.09.24

## 2022-03-22 NOTE — Progress Notes (Incomplete)
Cardiology Office Note    Date:  03/22/2022   ID:  Jason Day, DOB 06-Nov-1940, MRN [id number]  PCP:  Lind Covert, MD  Cardiologist:  Dr. Johnsie Cancel   Chief Complaint: Edema / CHF   History of Present Illness:   Jason Day is a 82 y.o. male CAD s/p CABG, ICM, PAF,  MR and HLD seen in f/u   He initially presented 09/2016 w/ acute pulmonary edema and diuresed. Echo showed reduced LVEF, down to 30%, leading to LHC, which showed severe multivessel CAD. 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 Post of afib >> on amiodarone and coumadin. He is claustrophobic and cannot have MRI for EF calculation  He has had recurrent afib and failed medical Rx with amiodarone LA 51 mm diameter on TTE Seen by [facility] 12/25/18 and ablation deferred plan for rate control and anticoagulation strategy Xarelto too expensive on coumadin now   Last echo  10/05/20 with EF 30-35% moderate bi atrial enlargement moderate to severe MR   The patient had L rotator cuff arthropathy 08/21/18.   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 Dig d/c and still on low dose Toprol He had frequent NSVT with longest run 18 beats Afib continuous He had 46 pauses longes lasting 4 seconds   TTE done 03/08/22 reviewed EF 25-30% severe RV depression  Severe LAE And moderate functional MR  I had a long talk with his daughter who lives in [place] and is a Forensic psychologist. We discussed his cognitive decline and cardiac decline She hopes to move to [place] in future and is going there next month She is concerned about patient and wife caring for themselves in a large house in [place] They have not wanted to move back to [place] to any kind of assisted living   ***  Past Medical History:  Diagnosis Date  . Acute combined systolic and diastolic congestive heart failure ([place]) 10/16/2016  . Allergic rhinitis 09/01/2020  . Bleeding hemorrhoids   . Chronic combined systolic and diastolic heart failure ([place])   . Chronic sinus complaints    overuses afrin  . Coronary atherosclerosis of native coronary artery   . Dyspnea   . Encounter for medical examination to establish care 04/22/2017  . Hyperlipidemia   . Irritable bowel syndrome 06/07/2017  . Ischemic cardiomyopathy   . Left atrial enlargement   . Long term (current) use of anticoagulants 12/30/2020  . Moderate mitral regurgitation   . Myocardial infarction ([place]) 09/2015  . Persistent atrial fibrillation ([place])   . RBBB    A- Fib  . Shortness of breath 12/29/2020  . Urinary incontinence 12/30/2020    Past Surgical History:  Procedure Laterality Date  . BACK SURGERY  ~2014   disc repair  . CARDIOVERSION  N/A 10/24/2018   Procedure: CARDIOVERSION;  Surgeon: Jerline Pain, MD;  Location: [facility] ENDOSCOPY;  Service: Cardiovascular;  Laterality: N/A;  . CHOLECYSTECTOMY    . COLONOSCOPY  04/06/2014   Hemorrhoids and diverticulosis performed in [place]  . COLONOSCOPY WITH PROPOFOL N/A 08/04/2019   Procedure: COLONOSCOPY WITH PROPOFOL;  Surgeon: Jason Horner, MD;  Location: [facility] ENDOSCOPY;  Service: Endoscopy;  Laterality: N/A;  . CORONARY ARTERY BYPASS GRAFT N/A 10/18/2016   Procedure: CORONARY ARTERY BYPASS GRAFTING (CABG) x five , using left internal mammary artery and right leg greater  saphenous vein harvested endoscopically;  Surgeon: Gaye Pollack, MD;  Location: [place] OR;  Service: Open Heart Surgery;  Laterality: N/A;  . ESOPHAGOGASTRODUODENOSCOPY (EGD) WITH PROPOFOL N/A 08/04/2019   Procedure: ESOPHAGOGASTRODUODENOSCOPY (EGD) WITH PROPOFOL;  Surgeon: Jason Horner, MD;  Location: [facility] ENDOSCOPY;  Service: Endoscopy;  Laterality: N/A;  . FOOT SURGERY    . HEMORRHOID BANDING    . HEMORRHOID SURGERY    . REVERSE SHOULDER ARTHROPLASTY Left 08/21/2018   Procedure: REVERSE SHOULDER ARTHROPLASTY;  Surgeon: Jason Britain, MD;  Location: [facility] ORS;  Service: Orthopedics;  Laterality: Left;  . RIGHT/LEFT HEART CATH AND CORONARY ANGIOGRAPHY N/A 10/16/2016   Procedure: RIGHT/LEFT HEART CATH AND CORONARY ANGIOGRAPHY;  Surgeon: Belva Crome, MD;  Location: [facility] CV LAB;  Service: Cardiovascular;  Laterality: N/A;  . stents in liver    . TEE WITHOUT CARDIOVERSION N/A 10/18/2016   Procedure: TRANSESOPHAGEAL ECHOCARDIOGRAM (TEE);  Surgeon: Gaye Pollack, MD;  Location: [facility];  Service: Open Heart Surgery;  Laterality: N/A;  . TRANSURETHRAL RESECTION OF PROSTATE N/A 11/19/2019   Procedure: CYSTOSCOPY TRANSURETHRAL RESECTION OF THE PROSTATE (TURP);  Surgeon: [name], MD;  Location: [facility] ORS;  Service: Urology;  Laterality: N/A;    Current Medications:  Prior to Admission medications   Medication Sig Start Date End Date Taking? Authorizing Provider  atorvastatin (LIPITOR) 20 MG tablet Take 0.5 tablets (10 mg total) by mouth daily. Patient taking differently: Take 10 mg by mouth every evening.  07/29/18  Yes Josue Hector, MD  digoxin (LANOXIN) 0.125 MG tablet Take 0.5 tablets (62.5 mcg total) by mouth daily. 06/26/18  Yes Josue Hector, MD  metoprolol succinate (TOPROL-XL) 25 MG 24 hr tablet Take 0.5 tablets (12.5 mg total) by mouth daily. Patient taking differently: Take 12.5 mg by mouth at bedtime.  06/26/18  Yes Josue Hector, MD  sacubitril-valsartan (ENTRESTO) 49-51 MG Take 1  tablet by mouth 2 (two) times daily. 07/29/18  Yes Josue Hector, MD  spironolactone (ALDACTONE) 25 MG tablet Take 0.5 tablets (12.5 mg total) by mouth daily. Patient taking differently: Take 12.5 mg by mouth every evening.  06/26/18  Yes Josue Hector, MD  warfarin (COUMADIN) 5 MG tablet Take as directed by [facility] Clinic Patient taking differently: Take 5 mg by mouth every evening. Take as directed by Coumadin Clinic 06/26/18  Yes Josue Hector, MD   Allergies:   Patient has no known allergies.   Social History   Socioeconomic History  . Marital status: Married    Spouse name: Not on file  . Number of children: 2  . Years of education: Not on file  . Highest education level: Not on file  Occupational History  . Occupation: Web designer    Comment: Retired  Tobacco Use  . Smoking status: Former    Packs/day: 0.50    Types: Cigarettes    Quit date: 1970  Years since quitting: 54.1  . Smokeless tobacco: Never  . Tobacco comments:    Smoked on and off  Vaping Use  . Vaping Use: Never used  Substance and Sexual Activity  . Alcohol use: No  . Drug use: No  . Sexual activity: Yes  Other Topics Concern  . Not on file  Social History Narrative   Married    Originally from [place] moved to [place] age 82 moved to [place] 2018   1 son Jeric Slagel in [place]   1dtr [place] in [place]       Has living will   Wife is health care POA--then son   Would accept resuscitation   No tube feeds if cognitively unaware   Social Determinants of Health   Financial Resource Strain: Not on file  Food Insecurity: Not on file  Transportation Needs: Not on file  Physical Activity: Not on file  Stress: Not on file  Social Connections: Not on file     Family History:  The patient's family history includes COPD in his father; Emphysema in his father; Healthy in his brother.   ROS:   Please see the history of present illness.    ROS All other systems  reviewed and are negative.   PHYSICAL EXAM:   VS:  There were no vitals taken for this visit.    Argumentative  Chronically ill male HEENT: normal Neck supple with no adenopathy JVP normal no bruits no thyromegaly Lungs clear with no wheezing and good diaphragmatic motion Heart:  S1/S2 apical MR murmur, no rub, gallop or click PMI  Enlarged  post sternotomy  Abdomen: benighn, BS positve, no tenderness, no AAA no bruit.  No HSM or HJR Foley catheter strapped to right leg  Distal pulses intact with no bruits Plus one edema bad varicosities bilaterally chronic stasis  Neuro non-focal Skin warm and dry No muscular weakness   Wt Readings from Last 3 Encounters:  02/11/22 155 lb (70.3 kg)  02/05/22 159 lb (72.1 kg)  10/12/21 153 lb (69.4 kg)      Studies/Labs Reviewed:   EKG:  9/10/21afib RBBB LAD old IMI rate 66   Recent Labs: 10/12/2021: ALT 14; Magnesium 2.0; TSH 1.960 02/05/2022: NT-Pro BNP 3,420 02/11/2022: Hemoglobin 13.5; Platelets 74 02/21/2022: BUN 20; Creatinine, Ser 1.15; Potassium 4.6; Sodium 142   Lipid Panel    Component Value Date/Time   CHOL 103 10/12/2021 1027   TRIG 61 10/12/2021 1027   HDL 44 10/12/2021 1027   CHOLHDL 2.3 10/12/2021 1027   CHOLHDL 6 04/22/2017 1431   VLDL 9 10/15/2016 1155   LDLCALC 45 10/12/2021 1027   LDLDIRECT 119.0 04/22/2017 1431    Additional studies/ records that were reviewed today include:   Echo 03/08/22  IMPRESSIONS     1. Left ventricular ejection fraction, by estimation, is 25 to 30%. Left  ventricular ejection fraction by PLAX is 26 %. The left ventricle has  severely decreased function. The left ventricle demonstrates global  hypokinesis. The left ventricular internal   cavity size was moderately dilated. There is moderate asymmetric left  ventricular hypertrophy of the basal-septal segment. Indeterminate  diastolic filling due to E-A fusion.   2. Right ventricular systolic function is severely reduced. The  right  ventricular size is mildly enlarged. There is normal pulmonary artery  systolic pressure. The estimated right ventricular systolic pressure is  64.3 mmHg.   3. Left atrial size was severely dilated.   4. Right atrial size was  mildly dilated.   5. The mitral valve is degenerative. Moderate mitral valve regurgitation.   6. Significant thickening of the non-coronary cusp. The aortic valve is  tricuspid. There is moderate thickening of the aortic valve. Aortic valve  regurgitation is not visualized. No aortic stenosis is present. Aortic  valve mean gradient measures 4.0  mmHg.   7. The pulmonic valve was abnormal.   8. Aortic dilatation noted. There is borderline dilatation of the  ascending aorta, measuring 38 mm.   9. The inferior vena cava is normal in size with <50% respiratory  variability, suggesting right atrial pressure of 8 mmHg.   Comparison(s): Changes from prior study are noted. 10/05/2020: LVEF 30-35%,  global HK, grade 2DD.    Cardiac Telemetry Monitor : Patch Wear Time:  14 days and 0 hours (2023-12-28T13:57:23-0500 to 2024-01-11T13:57:23-0500)   28 Ventricular Tachycardia runs occurred, the run with the fastest interval lasting 5 beats with a max rate of 226 bpm, the longest lasting 18 beats with an avg rate of 135 bpm. Atrial Fibrillation occurred continuously (100% burden), ranging from 29-188  bpm (avg of 65 bpm). Bundle Branch Block/IVCD was present. 46 Pauses occurred, the longest lasting 4 secs (15 bpm). Idioventricular Rhythm was present. Isolated VEs were occasional (3.8%, 48389), VE Couplets were rare (<1.0%, 1328), and VE Triplets were  rare (<1.0%, 21). Ventricular Bigeminy and Trigeminy were present. Previously notified: MD notification criteria for First Documentation of Atrial Fibrillation met - Notified Even W., PA on 15 Feb 2022 at 6:18 PM EST (SW). MD notification criteria for  Pauses and Slow Atrial Fibrillation met -Notified Frankey Poot RN on 24 Feb 2022  at 12:07 AM EST (JM).   Jenkins Rouge MD [facility]   ASSESSMENT & PLAN:    1. Acute on chronic combined CHF  - LVEF of 25-30% with moderate functional MR   Both Dr Rayann Heman and Quentin Ore deferred CRT pacing given age and narrow RBBB ***  2. CAD s/p CABG -Recent EKG with nonspecific changes.  Patient denies any anginal symptoms.  Should be on 81 mg ASA ***  3 Chronic Afib  - on coumadin , Xarelto too expensive Failed cardioversion and medical Rx with amiodarone Seen by Dr Rayann Heman 12/25/18 and ablation deferred adopted strategy of rate control and anticoagulation LA dimension by echo 51 mm INR Rx 2.0 02/22/22   5.  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 Edema:  Discussed seeing vein doctors at [facility] Compression stockings and diuretic with elevation of legs   7. Urology:  F/u post cystoscopy / foley out   8. Behavioral:  needs to f/u with primary as he has dementia with behavioral issues that are getting worse. Discussed taking to primary about appetite stimulant as well Suggested Boost supplement Long talk with daughter [name] who is concerned about their living situation and ability to care for themselves ***  He has been resistent to f/u EP and CHF visits Thinks he's getting the run around and passed around Given his age and cognitive decline I think he is not likely candidate for advanced Rx;s and may be more appropriate for palliative /hospice as daughter suggests   F/U in 6 months   Signed, Jenkins Rouge, MD  03/22/2022 4:47 PM    [address], [place], [place]  [address] Phone: [phone]; Fax: [phone]

## 2022-03-26 ENCOUNTER — Other Ambulatory Visit: Payer: Self-pay | Admitting: Cardiovascular Disease

## 2022-03-28 ENCOUNTER — Other Ambulatory Visit: Payer: Self-pay | Admitting: Cardiovascular Disease

## 2022-03-28 DIAGNOSIS — I4821 Permanent atrial fibrillation: Secondary | ICD-10-CM

## 2022-03-28 NOTE — Telephone Encounter (Signed)
Prescription refill request received for warfarin Lov: 02/05/22 Jason Day Cancel) Next INR check: 05/03/22 Warfarin tablet strength: '5mg'$   Appropriate dose. Refill sent.

## 2022-03-29 ENCOUNTER — Other Ambulatory Visit (HOSPITAL_COMMUNITY): Payer: Self-pay

## 2022-03-29 ENCOUNTER — Ambulatory Visit
Admission: RE | Admit: 2022-03-29 | Discharge: 2022-03-29 | Disposition: A | Discharge status: Home / Self Care | Payer: PPO | Source: Ambulatory Visit | Attending: Cardiology | Admitting: Cardiology

## 2022-03-29 ENCOUNTER — Encounter (HOSPITAL_COMMUNITY): Payer: Self-pay | Admitting: Cardiology

## 2022-03-29 VITALS — BP 110/70 | HR 46 | Wt 157.0 lb

## 2022-03-29 DIAGNOSIS — Z951 Presence of aortocoronary bypass graft: Secondary | ICD-10-CM | POA: Diagnosis not present

## 2022-03-29 DIAGNOSIS — I482 Chronic atrial fibrillation, unspecified: Secondary | ICD-10-CM

## 2022-03-29 DIAGNOSIS — I5042 Chronic combined systolic (congestive) and diastolic (congestive) heart failure: Secondary | ICD-10-CM

## 2022-03-29 DIAGNOSIS — I255 Ischemic cardiomyopathy: Secondary | ICD-10-CM | POA: Diagnosis not present

## 2022-03-29 DIAGNOSIS — Z7901 Long term (current) use of anticoagulants: Secondary | ICD-10-CM | POA: Diagnosis not present

## 2022-03-29 DIAGNOSIS — I5022 Chronic systolic (congestive) heart failure: Secondary | ICD-10-CM | POA: Diagnosis not present

## 2022-03-29 DIAGNOSIS — R1310 Dysphagia, unspecified: Secondary | ICD-10-CM | POA: Insufficient documentation

## 2022-03-29 DIAGNOSIS — I447 Left bundle-branch block, unspecified: Secondary | ICD-10-CM | POA: Diagnosis not present

## 2022-03-29 DIAGNOSIS — R55 Syncope and collapse: Secondary | ICD-10-CM | POA: Diagnosis not present

## 2022-03-29 DIAGNOSIS — Z79899 Other long term (current) drug therapy: Secondary | ICD-10-CM | POA: Insufficient documentation

## 2022-03-29 DIAGNOSIS — I2510 Atherosclerotic heart disease of native coronary artery without angina pectoris: Secondary | ICD-10-CM | POA: Diagnosis not present

## 2022-03-29 DIAGNOSIS — I5082 Biventricular heart failure: Secondary | ICD-10-CM | POA: Diagnosis not present

## 2022-03-29 DIAGNOSIS — R634 Abnormal weight loss: Secondary | ICD-10-CM

## 2022-03-29 DIAGNOSIS — Z7984 Long term (current) use of oral hypoglycemic drugs: Secondary | ICD-10-CM | POA: Diagnosis not present

## 2022-03-29 DIAGNOSIS — I4821 Permanent atrial fibrillation: Secondary | ICD-10-CM | POA: Insufficient documentation

## 2022-03-29 DIAGNOSIS — I472 Ventricular tachycardia, unspecified: Secondary | ICD-10-CM | POA: Insufficient documentation

## 2022-03-29 DIAGNOSIS — I495 Sick sinus syndrome: Secondary | ICD-10-CM | POA: Diagnosis not present

## 2022-03-29 DIAGNOSIS — I340 Nonrheumatic mitral (valve) insufficiency: Secondary | ICD-10-CM | POA: Diagnosis not present

## 2022-03-29 MED ORDER — DAPAGLIFLOZIN PROPANEDIOL 10 MG PO TABS: 10.0000 mg | ORAL_TABLET | Freq: Every day | ORAL | 3 refills | Status: DC

## 2022-03-29 NOTE — Progress Notes (Signed)
PCP: Lind Covert, MD Cardiology: Dr. Johnsie Cancel HF Cardiology: Dr. Aundra Dubin  82 y.o. with history of CAD s/p CABG, ischemic cardiomyopathy, and permanent atrial fibrillation was referred by Dr. Johnsie Cancel for evaluation of CHF.  Patient had CABG x 5 in 8/18.  Post-op echo showed EF 35%. Since that time, LV EF has ranged from 25-35%.  Most recent echo in 1/24 showed EF 25-30%, moderate LV dilation, severely decreased RV systolic function, severe LAE, moderate MR. He had post-op atrial fibrillation.  He failed amiodarone and cardioversion, and it was decided not to pursue AF ablation.  He is in permanent atrial fibrillation.  In 12/23, he had a presyncopal episode.  Zio monitor was placed showing AF rate ranging 29 - 100 bpm, 46 pauses with the longest 4 seconds (at night). Also 28 NSVT runs, longest 18 beats.   Patient reports doing quite well overall. His wife is concerned about weight loss.  He says that he does not eat as much as in the past because food gets stuck in his esophagus.  He has to cut up meat very finely.  He gets tired at times, but it sounds like he stays very active.  He works in his yard, laid about 500 bricks in a patio recently.  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  No dyspnea with his usual activities.  No chest pain.  He denies further episodes of syncope/presyncope, no orthostatic-type lightheadedness. He rarely takes Lasix.   Labs (1/24): K 4.6, creatinine 1.15  ECG (personally reviewed): Atrial fibrillation rate 54, RBBB, LAFB  PMH:  1. CAD: S/p CABG 8/18 with LIMA-LAD, SVG-D2, SVG-OM1, seq SVG-OM2 and OM3.  2. Atrial fibrillation: Post-op CABG initially.  Failed amiodarone and cardioversion. Decided against ablation in 2020. Now permanent.  3. Chronic systolic CHF: Ischemic cardiomyopathy.  - Echo (1/19): EF 35% - Echo (2020): EF 30-35% - Echo (8/22): EF 30-35%, moderate-severe MR - Echo (1/24): EF 25-30%, moderate LV dilation, severely decreased  RV systolic function, severe LAE, moderate MR.  4. 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  Moderate-severe on 8/22 echo but moderate on 1/24 echo.  5. Bradycardia: Presyncopal episode in 12/23.  Zio monitor (1/24) with AF rate ranging 29 - 100 bpm, 46 pauses with the longest 4 seconds (at night).  6. NSVT: 28 NSVT runs on 1/24 Zio monitor, longest 18 beats.  7. Mild dementia 8. 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  Married.  Quit smoking remotely.  Rare ETOH.   Family History  Problem Relation Age of Onset   COPD Father    Emphysema Father    Healthy Brother    Diabetes Neg Hx    Cancer Neg Hx    Stomach cancer Neg Hx    Colon cancer Neg Hx    ROS: All systems reviewed and negative except as per HPI.   Current Outpatient Medications  Medication Sig Dispense Refill   atorvastatin (LIPITOR) 20 MG tablet TAKE 1/2 TABLET BY MOUTH DAILY 45 tablet 3   dapagliflozin propanediol (FARXIGA) 10 MG TABS tablet Take 1 tablet (10 mg total) by mouth daily before breakfast. 90 tablet 3   ENTRESTO 49-51 MG TAKE 1 TABLET BY MOUTH TWICE A DAY 180 tablet 3   furosemide (LASIX) 40 MG tablet Take 40 mg by mouth as needed for edema.     metoprolol succinate (TOPROL-XL) 25 MG 24 hr tablet TAKE 1/2 TABLET BY MOUTH DAILY 45 tablet 3   oxymetazoline (AFRIN) 0.05 %  nasal spray Place 1 spray into both nostrils at bedtime.     spironolactone (ALDACTONE) 25 MG tablet TAKE 1 TABLET (25 MG TOTAL) BY MOUTH DAILY. 90 tablet 3   warfarin (COUMADIN) 5 MG tablet TAKE 1 TABLET BY MOUTH DAILY AS DIRECTED BY THE COUMADIN CLINIC. 35 tablet 5   No current facility-administered medications for this encounter.   BP 110/70   Pulse (!) 46   Wt 71.2 kg (157 lb)   SpO2 100%   BMI 21.29 kg/m  General: NAD Neck: No JVD, no thyromegaly or thyroid nodule.  Lungs: Clear to auscultation bilaterally with normal respiratory effort. CV: Nondisplaced PMI.  Heart irregular S1/S2, no S3/S4, no murmur.  1+  ankle edema.  Soft left carotid bruit.  Normal pedal pulses.  Abdomen: Soft, nontender, no hepatosplenomegaly, no distention.  Skin: Intact without lesions or rashes.  Neurologic: Alert and oriented x 3.  Psych: Normal affect. Extremities: No clubbing or cyanosis.  HEENT: Normal.   Assessment/Plan: 1. Chronic systolic CHF: Ischemic cardiomyopathy.  Echoes dating from 2018 have shown EF ranging 25-35%.  Most recent echo in 1/24 showed EF 25-30%, moderate LV dilation, severely decreased RV systolic function, severe LAE, moderate MR. Despite biventricular failure, he is minimally symptomatic (NYHA class II).  He is not volume overloaded on exam and rarely takes prn Lasix.  He is overall quite functional and would continue aggressive medical management.  - Continue Entresto 49/51 bid.  - Off digoxin with bradycardia, would stay off.  - Continue low dose Toprol XL 12.5 mg daily.  Would like to keep this going but will not increase with bradycardia (see below).  - Continue spironolactone 25 mg daily.  - Add Farxiga 10 mg daily.  BMET/BNP today and again in 10 days.  - Not CRT candidate with LBBB.  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  6. NSVT: 28 NSVT runs on 1/24 Zio monitor, longest 18 beats.  2. Dysphagia: This seems significant, I worry he has an esophageal stricture.  - Refer to [facility] for evaluation, will likely need EGD with dilation.  3. Atrial fibrillation: Permanent.  Failed amiodarone and DCCV, and ablation was decided against.  No GI bleeding.  Recent Zio monitor showed heart rate ranging 29-100 bpm.  - Continue warfarin, could consider Eliquis transition in the future.  4. Bradycardia: HR as low as 29 bpm on 1/24 Zio monitor, also with 46 pauses (longest 4 seconds).  Most of these episodes occurred at night while sleeping.  He is not interested in sleep study/CPAP.  He had a presyncopal episode in 12/23 prior to wearing monitor.  He is off digoxin now  and Toprol XL has been cut back to 12.5 mg daily.  - Would like to continue low dose Toprol XL for now.  - He will need followup with Dr. Quentin Ore, need to consider pacemaker though he really has not had recent symptoms referable to bradycardia.  He is not a CRT candidate with RBBB.  5. Carotid bruit: Soft, will order carotid dopplers.  6. 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  Most recent echo in 1/24 showed moderate MR. Follow for now.  7. CAD: CABG x 5 in 2018.  No chest pain.  - No ASA given stable CAD and warfarin use.  - Continue atorvastatin. Check lipids today.   Followup in 2 months with me.   Loralie Champagne 03/29/2022

## 2022-03-29 NOTE — Patient Instructions (Signed)
START Faxiga 10 mg daily.  Labs done today, your results will be available in MyChart, we will contact you for abnormal readings.  Repeat blood work in 10 days.  Your provider has ordered Carotid doppler test for you. You will be called to have this test arranged.  Please follow up with Dr. Quentin Ore for your slow heart.  You have been referred to the [facility] [place]. They will call you to arrange your appointment.  Ensure supplement  Twice daily  Your physician recommends that you schedule a follow-up appointment in: 2 months  If you have any questions or concerns before your next appointment please send [place] a message through [place] or call our office at [phone].    TO LEAVE A MESSAGE FOR THE NURSE SELECT OPTION 2, PLEASE LEAVE A MESSAGE INCLUDING: YOUR NAME DATE OF BIRTH CALL BACK NUMBER REASON FOR CALL**this is important as we prioritize the call backs  YOU WILL RECEIVE A CALL BACK THE SAME DAY AS LONG AS YOU CALL BEFORE 4:00 PM  At the [facility], you and your health needs are our priority. As part of our continuing mission to provide you with exceptional heart care, we have created designated Provider Care Teams. These Care Teams include your primary Cardiologist (physician) and Advanced Practice Providers (APPs- Physician Assistants and Nurse Practitioners) who all work together to provide you with the care you need, when you need it.   You may see any of the following providers on your designated Care Team at your next follow up: Dr Glori Bickers Dr Loralie Champagne Dr. Roxana Hires, NP Lyda Jester, [place] [facility] [place], [place] Forestine Na, NP Audry Riles, PharmD   Please be sure to bring in all your medications bottles to every appointment.    Thank you for choosing [facility]

## 2022-03-30 ENCOUNTER — Ambulatory Visit: Payer: PPO | Admitting: Cardiovascular Disease

## 2022-04-11 ENCOUNTER — Ambulatory Visit (HOSPITAL_COMMUNITY)
Admission: RE | Admit: 2022-04-11 | Discharge: 2022-04-11 | Disposition: A | Discharge status: Home / Self Care | Payer: PPO | Source: Ambulatory Visit | Attending: Internal Medicine | Admitting: Internal Medicine

## 2022-04-11 DIAGNOSIS — I5042 Chronic combined systolic (congestive) and diastolic (congestive) heart failure: Secondary | ICD-10-CM | POA: Insufficient documentation

## 2022-04-11 LAB — BASIC METABOLIC PANEL
Anion gap: 8 (ref 5–15)
BUN: 35 mg/dL — ABNORMAL HIGH (ref 8–23)
CO2: 28 mmol/L (ref 22–32)
Calcium: 9.1 mg/dL (ref 8.9–10.3)
Chloride: 104 mmol/L (ref 98–111)
Creatinine, Ser: 1.12 mg/dL (ref 0.61–1.24)
GFR, Estimated: >60 mL/min (ref >60)
Glucose, Bld: 94 mg/dL (ref 70–99)
Potassium: 4.1 mmol/L (ref 3.5–5.1)
Sodium: 140 mmol/L (ref 135–145)

## 2022-04-13 ENCOUNTER — Ambulatory Visit (HOSPITAL_COMMUNITY)
Admission: RE | Admit: 2022-04-13 | Discharge: 2022-04-13 | Disposition: A | Discharge status: Home / Self Care | Payer: PPO | Source: Ambulatory Visit | Attending: Cardiovascular Disease | Admitting: Cardiovascular Disease

## 2022-04-13 DIAGNOSIS — R0989 Other specified symptoms and signs involving the circulatory and respiratory systems: Secondary | ICD-10-CM | POA: Insufficient documentation

## 2022-04-13 DIAGNOSIS — I5042 Chronic combined systolic (congestive) and diastolic (congestive) heart failure: Secondary | ICD-10-CM | POA: Insufficient documentation

## 2022-04-17 ENCOUNTER — Telehealth (HOSPITAL_COMMUNITY): Payer: Self-pay

## 2022-04-17 NOTE — Telephone Encounter (Signed)
Patient aware of Carotid US results.

## 2022-04-17 NOTE — Telephone Encounter (Signed)
-----   Message from Larey Dresser, MD sent at 04/15/2022 12:24 PM EST ----- Minimal carotid stenosis.

## 2022-05-03 ENCOUNTER — Ambulatory Visit: Payer: PPO | Attending: Internal Medicine | Admitting: *Deleted

## 2022-05-03 DIAGNOSIS — Z951 Presence of aortocoronary bypass graft: Secondary | ICD-10-CM

## 2022-05-03 DIAGNOSIS — Z5181 Encounter for therapeutic drug level monitoring: Secondary | ICD-10-CM

## 2022-05-03 LAB — POCT INR: INR: 2.8 (ref 2.0–3.0)

## 2022-05-03 NOTE — Patient Instructions (Signed)
Description   Continue taking Warfarin 1 tablet daily. Recheck INR in 10 weeks per pt request (Pt aware of risk). Call coumadin clinic for any changes in medications or upcoming procedures.  Coumadin Clinic [phone]

## 2022-05-15 ENCOUNTER — Telehealth: Payer: Self-pay | Admitting: Gastroenterology

## 2022-05-15 NOTE — Telephone Encounter (Signed)
Hi Dr. Candis Schatz,   We received patient's referral for esophageal stricture and weight loss. He is requesting a transfer of care specifically over to you.Last seen by Howie Ill in 2021 and last colonoscopy in 2021. Records were received for you to review and advise on scheduling.   Thank you.

## 2022-06-07 NOTE — Telephone Encounter (Signed)
Hi Dr. Leone Payor,   Would you approve the request for transfer of care below? The patient was last seen by you in 2019 before seeing [facility].  Thank you.

## 2022-06-07 NOTE — Telephone Encounter (Signed)
This patient left our practice (me) and switched to [facility] - he should return there

## 2022-06-13 ENCOUNTER — Other Ambulatory Visit (HOSPITAL_COMMUNITY): Payer: Self-pay

## 2022-06-13 ENCOUNTER — Telehealth: Payer: Self-pay | Admitting: Cardiovascular Disease

## 2022-06-13 ENCOUNTER — Other Ambulatory Visit: Payer: Self-pay | Admitting: Cardiovascular Disease

## 2022-06-13 MED ORDER — ENTRESTO 49-51 MG PO TABS: 1.0000 | ORAL_TABLET | Freq: Two times a day (BID) | ORAL | 0 refills | Status: DC

## 2022-06-13 NOTE — Telephone Encounter (Signed)
Called patient to advise on scheduling, left voicemail.

## 2022-06-13 NOTE — Telephone Encounter (Signed)
*  STAT* If patient is at the pharmacy, call can be transferred to refill team.   1. Which medications need to be refilled? (please list name of each medication and dose if known)   ENTRESTO 49-51 MG  2. Which pharmacy/location (including street and city if local pharmacy) is medication to be sent to?  [id number] [place], [place] - [address]  3. Do they need a 30 day or 90 day supply?   90 day   Patient states he only has 1 tablet left.

## 2022-07-11 ENCOUNTER — Encounter (HOSPITAL_COMMUNITY): Payer: Self-pay

## 2022-07-11 ENCOUNTER — Telehealth: Payer: Self-pay | Admitting: *Deleted

## 2022-07-11 ENCOUNTER — Telehealth (HOSPITAL_COMMUNITY): Payer: Self-pay

## 2022-07-11 ENCOUNTER — Other Ambulatory Visit (HOSPITAL_COMMUNITY): Payer: Self-pay

## 2022-07-11 NOTE — Telephone Encounter (Signed)
I spoke to patient about his referral to Dr. Tomasa Rand. He was upset because they will not see him. I called the office to find out more information - Dr. Tomasa Rand will not see him because he was formerly with Dr. Leone Payor. Basically [facility] will not see this patient. They are recommending he stay with [facility] or find another GI doctor.   Is there someone else you want to refer him to?

## 2022-07-11 NOTE — Telephone Encounter (Signed)
Is there some reason he could not just see Dr. Leone Payor again? I did not refer him to anyone in specific, just [facility]. He can see whoever.  If they continue to refuse to see him for whatever reason, can refer to [facility].

## 2022-07-11 NOTE — Telephone Encounter (Signed)
Pt called and asked if he could be seen today instead of tomorrow since his appt is tomorrow. Advised pt over the phone the next available is at 1030am he declined with curse words. He states can you just stick my finger and mail me the results, advised over the phone we cannot do that and if he is willing to be seen at 1030am then we will do the entire visit and take care of the results at one time. He became loud and aggressive over the phone and stated I do not know why you can't just stick my finger and mail my results. I asked if he would like the appointment or not and he curse and said no. Call ended.  Then pt called back into the office and he stated he wanted to reschedule  tomorrow's appt. Asked when he would like to reschedule and he stated he could not do it now. He asked to cancel and stated he would call back.

## 2022-07-11 NOTE — Progress Notes (Signed)
error 

## 2022-07-11 NOTE — Telephone Encounter (Signed)
Referral faxed to [facility]. Patient notified via Mychart and left message for him to call back

## 2022-07-12 ENCOUNTER — Ambulatory Visit: Payer: PPO

## 2022-08-02 ENCOUNTER — Ambulatory Visit: Payer: PPO | Attending: Internal Medicine | Admitting: *Deleted

## 2022-08-02 DIAGNOSIS — Z5181 Encounter for therapeutic drug level monitoring: Secondary | ICD-10-CM | POA: Diagnosis not present

## 2022-08-02 DIAGNOSIS — Z951 Presence of aortocoronary bypass graft: Secondary | ICD-10-CM

## 2022-08-02 LAB — POCT INR: POC INR: 2.8

## 2022-08-02 NOTE — Patient Instructions (Signed)
Description   Continue taking Warfarin 1 tablet daily. Recheck INR in 10 weeks per pt request (Pt aware of risk). Call coumadin clinic for any changes in medications or upcoming procedures.  Coumadin Clinic [phone]     

## 2022-08-13 ENCOUNTER — Ambulatory Visit (INDEPENDENT_AMBULATORY_CARE_PROVIDER_SITE_OTHER): Payer: PPO | Admitting: Gastroenterology

## 2022-08-13 ENCOUNTER — Encounter: Payer: Self-pay | Admitting: Gastroenterology

## 2022-08-13 VITALS — BP 100/70 | HR 54 | Ht 73.0 in | Wt 150.0 lb

## 2022-08-13 DIAGNOSIS — R159 Full incontinence of feces: Secondary | ICD-10-CM

## 2022-08-13 DIAGNOSIS — R143 Flatulence: Secondary | ICD-10-CM

## 2022-08-13 DIAGNOSIS — K580 Irritable bowel syndrome with diarrhea: Secondary | ICD-10-CM

## 2022-08-13 MED ORDER — RIFAXIMIN 550 MG PO TABS: 550.0000 mg | ORAL_TABLET | Freq: Three times a day (TID) | ORAL | 0 refills | Status: DC

## 2022-08-13 NOTE — Patient Instructions (Addendum)
_______________________________________________________  If your blood pressure at your visit was 140/90 or greater, please contact your primary care physician to follow up on this.  _______________________________________________________  If you are age 83 or older, your body mass index should be between 23-30. Your Body mass index is 19.79 kg/m. If this is out of the aforementioned range listed, please consider follow up with your Primary Care Provider.  If you are age 67 or younger, your body mass index should be between 19-25. Your Body mass index is 19.79 kg/m. If this is out of the aformentioned range listed, please consider follow up with your Primary Care Provider.   We have sent the following medications to your pharmacy for you to pick up at your convenience: Rifaximan 550mg  three times daily for fourteen days.   The [facility] providers would like to encourage you to use [facility] to communicate with providers for non-urgent requests or questions.  Due to long hold times on the telephone, sending your provider a message by [facility] may be a faster and more efficient way to get a response.  Please allow 48 business hours for a response.  Please remember that this is for non-urgent requests.   It was a pleasure to see you today!  Thank you for trusting me with your gastrointestinal care!    Scott E.Tomasa Rand, MD

## 2022-08-13 NOTE — Progress Notes (Signed)
HPI : Jason Day is a 82 y.o. male with a history of coronary artery disease status post CABG, atrial fibrillation and congestive heart failure who is referred to [place] by [facility], *for further evaluation and management of chronic diarrhea.  The patient is accompanied by his wife who provides much of the history.  The patient has had problems with diarrhea with urgency and incontinence for about 4 years now.  His symptoms apparently started about 6 months after his bypass surgery.  Patient's wife also reports significant weight loss going from 215 pounds now to 150 pounds over these four years.  Review of the patient's weights documented in the EMR indicate gradual weight loss since 2019, going from around 190 pounds to his current weight of 150 pounds.  His weights vary quite a bit from encounter to encounter, but overall it appears he has lost about 10 pounds in the past year. The patient attributes his weight loss to not eating.  He does not eat much, because the more that he eats, the more diarrhea and incontinence he has.  If he does not eat, his symptoms are much improved.  The patient describes significant fecal urgency that impairs his ability to leave the house.  He states that for days on and, he will have frequent urges to go and will often times not be able to get to the toilet in time.  He has symptoms most days of the week (estimates 5 days out of the week).  Symptoms are worse with larger meals.  He does not typically have nocturnal bowel movements, but he does wake up at night to urinate, and will often pass small amounts of stool with gas during these voids. In addition to the diarrhea and incontinence, he has significant bloating and flatulence.  He often passes stool with flatulence.  He denies significant abdominal pain. His stools are not bloody.  His stools are usually not watery either, but they are more "slimy" with little formed to them. No nausea or vomiting.   Constipation is never a problem for him.  Other than large meals causing worsening symptoms, he has not noted any other foods that cause worsening symptoms.  When asked if he had made any dietary changes, the patient's wife lists off a wide variety of foods that he he was recommended not to eat to include white bread, tomato sauce, dairy.  It is not clear if these dietary recommendations were made for heart health purposes or for his GI symptoms.  When pressed further, she states that he did try a low FODMAP diet which was not helpful.  He continues to avoid lactose other than occasional cheese.  He has not tried strict gluten-free diet. He was previously followed by [facility] from 2019 until now.  I have limited records from [facility] at the time of his visit, which did include a colonoscopy in 2021 to evaluate hematochezia.  This colonoscopy was notable for internal hemorrhoids, but was otherwise normal.  Random biopsies were not taken at that time.  He did have a flexible sigmoidoscopy in June 2020 in which random biopsies were taken and did not show microscopic colitis.  An EGD in June 2021 showed gastric erythema with presence of bilious fluid in the stomach, but was otherwise normal. The patient does recall being given a powder which he took with meals which was not helpful.  He also recalls being given suppositories and "some pills", but he does not recall the  names, he just recalls nothing helped.  He does not recall being diagnosed with SIBO or being given any antibiotics.  He does think that he has tried Imodium and Lomotil and they were not helpful.  Patient's wife states that the patient needs a pacemaker, but she says he was "too thin" and was felt to be too high risk for anesthesia.  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      Weights: Today: 150lb Mar 29, 2022:  157lb Oct 12, 2021:  153lb May 24, 2021:  160lb Mar 08, 2021:  168lb Dec 27, 2020:  162lb August 31, 2020:  179lb Sept 30, 2021:  170lbs September 02, 2018: 190lbs   Saw Dr. Leone Payor in 2019 Was scheduled for colonoscopy, then switched to [facility] who he saw from 2019 to 2021 (Dr. Randa Evens, then Dr. Evette Cristal)  Colonoscopy August 04, 2019 (Dr. Evette Cristal) Rectal bleeding Internal hemorrhoids, otherwise normal colonoscopy  EGD August 04, 2019 (Dr. Evette Cristal) Gastric erythema, bilious fluid in stomach, otherwise normal  Flex Sig Jun 2020 Dr. Randa Evens Normal with random biopsies  Hemorrhoid banding in 2019 Dr. Leone Payor; all 3 columns  Colonoscopy 2016 Dr. Annia Friendly Maxson Left sided diverticulosis, otherwise normal  From Cardiology's note Feb 2024 Patient had CABG x 5 in 8/18. Post-op echo showed EF 35%. Since that time, LV EF has ranged from 25-35%. Most recent echo in 1/24 showed EF 25-30%, moderate LV dilation, severely decreased RV systolic function, severe LAE, moderate MR. He had post-op atrial fibrillation. He failed amiodarone and cardioversion, and it was decided not to pursue AF ablation. He is in permanent atrial fibrillation. In 12/23, he had a presyncopal episode. Zio monitor was placed showing AF rate ranging 29 - 100 bpm, 46 pauses with the longest 4 seconds (at night). Also 28 NSVT runs, longest 18 beats.     Past Medical History:  Diagnosis Date   Acute combined systolic and diastolic congestive heart failure (HCC) 10/16/2016   Allergic rhinitis 09/01/2020   Anal fissure    Bleeding hemorrhoids    Chronic combined systolic and diastolic heart failure (HCC)    Chronic sinus complaints    overuses afrin   Coronary atherosclerosis of native coronary artery    Dyspnea    Encounter for medical examination to establish care 04/22/2017   Gallstones    Hyperlipidemia    Irritable bowel syndrome 06/07/2017   Ischemic cardiomyopathy    Left atrial  enlargement    Long term (current) use of anticoagulants 12/30/2020   Moderate mitral regurgitation    Myocardial infarction (HCC) 09/2015   Persistent atrial fibrillation (HCC)    RBBB    A- Fib   Shortness of breath 12/29/2020   Urinary incontinence 12/30/2020     Past Surgical History:  Procedure Laterality Date   BACK SURGERY  ~2014   disc repair   CARDIOVERSION N/A 10/24/2018   Procedure: CARDIOVERSION;  Surgeon: Jake Bathe, MD;  Location: [facility] ENDOSCOPY;  Service: Cardiovascular;  Laterality: N/A;   CHOLECYSTECTOMY     COLONOSCOPY  04/06/2014   Hemorrhoids and diverticulosis performed in [place]   COLONOSCOPY WITH PROPOFOL N/A 08/04/2019   Procedure: COLONOSCOPY WITH PROPOFOL;  Surgeon: Graylin Shiver, MD;  Location: [facility] ENDOSCOPY;  Service: Endoscopy;  Laterality: N/A;   CORONARY ARTERY BYPASS GRAFT N/A 10/18/2016   Procedure: CORONARY ARTERY  BYPASS GRAFTING (CABG) x five , using left internal mammary artery and right leg greater saphenous vein harvested endoscopically;  Surgeon: Alleen Borne, MD;  Location: [facility] OR;  Service: Open Heart Surgery;  Laterality: N/A;   ESOPHAGOGASTRODUODENOSCOPY (EGD) WITH PROPOFOL N/A 08/04/2019   Procedure: ESOPHAGOGASTRODUODENOSCOPY (EGD) WITH PROPOFOL;  Surgeon: Graylin Shiver, MD;  Location: [facility] ENDOSCOPY;  Service: Endoscopy;  Laterality: N/A;   FOOT SURGERY     HEMORRHOID BANDING     HEMORRHOID SURGERY     REVERSE SHOULDER ARTHROPLASTY Left 08/21/2018   Procedure: REVERSE SHOULDER ARTHROPLASTY;  Surgeon: Francena Hanly, MD;  Location: [facility] ORS;  Service: Orthopedics;  Laterality: Left;   RIGHT/LEFT HEART CATH AND CORONARY ANGIOGRAPHY N/A 10/16/2016   Procedure: RIGHT/LEFT HEART CATH AND CORONARY ANGIOGRAPHY;  Surgeon: Lyn Records, MD;  Location: [facility] INVASIVE CV LAB;  Service: Cardiovascular;  Laterality: N/A;   stents in liver     TEE WITHOUT CARDIOVERSION N/A 10/18/2016   Procedure: TRANSESOPHAGEAL ECHOCARDIOGRAM (TEE);  Surgeon: Alleen Borne, MD;  Location: [facility] OR;  Service: Open Heart Surgery;  Laterality: N/A;   TRANSURETHRAL RESECTION OF PROSTATE N/A 11/19/2019   Procedure: CYSTOSCOPY TRANSURETHRAL RESECTION OF THE PROSTATE (TURP);  Surgeon: Bjorn Pippin, MD;  Location: [facility] ORS;  Service: Urology;  Laterality: N/A;   Family History  Problem Relation Age of Onset   COPD Father    Emphysema Father    Healthy Brother    Diabetes Neg Hx    Cancer Neg Hx    Stomach cancer Neg Hx    Colon cancer Neg Hx    Social History   Tobacco Use   Smoking status: Former    Packs/day: .5    Types: Cigarettes    Quit date: 1970    Years since quitting: 54.5   Smokeless tobacco: Never   Tobacco comments:    Quit in 1970  Vaping Use   Vaping Use: Never used  Substance Use Topics   Alcohol use: No   Drug use: No   Current Outpatient Medications  Medication Sig Dispense Refill   atorvastatin (LIPITOR) 20 MG tablet TAKE 1/2 TABLET BY MOUTH DAILY 45 tablet 3   dapagliflozin propanediol (FARXIGA) 10 MG TABS tablet Take 1 tablet (10 mg total) by mouth daily before breakfast. 90 tablet 3   furosemide (LASIX) 40 MG tablet Take 40 mg by mouth as needed for edema.     metoprolol succinate (TOPROL-XL) 25 MG 24 hr tablet TAKE 1/2 TABLET BY MOUTH DAILY 45 tablet 3   oxymetazoline (AFRIN) 0.05 % nasal spray Place 1 spray into both nostrils at bedtime.     sacubitril-valsartan (ENTRESTO) 49-51 MG Take 1 tablet by mouth 2 (two) times daily. Needs appointment with provider for future refills. 180 tablet 0   spironolactone (ALDACTONE) 25 MG tablet TAKE 1 TABLET (25 MG TOTAL) BY MOUTH DAILY. 90 tablet 3   warfarin (COUMADIN) 5 MG tablet TAKE 1 TABLET BY MOUTH DAILY AS DIRECTED BY THE COUMADIN CLINIC. 35 tablet 5   No current facility-administered medications for this visit.   No Known Allergies   Review of Systems: All systems reviewed and negative except where noted in HPI.    No results found.  Physical Exam: BP 100/70   Pulse (!) 54    Ht 6\' 1"  (1.854 m)   Wt 150 lb (68 kg)   BMI 19.79 kg/m  Constitutional: Pleasant,well-developed, thin Caucasian male in no acute distress.  Accompanied by wife HEENT: Normocephalic and  atraumatic. Conjunctivae are normal. No scleral icterus. Neck supple.  Cardiovascular: Normal rate, regular rhythm.  Pulmonary/chest: Effort normal and breath sounds normal. No wheezing, rales or rhonchi. Abdominal: Soft, nondistended, nontender. Bowel sounds hyperactive throughout. There are no masses palpable. No hepatomegaly. Extremities: no edema Neurological: Alert and oriented to person place and time. Skin: Skin is warm and dry. No rashes noted. Psychiatric: Normal mood and affect. Behavior is normal.  CBC    Component Value Date/Time   WBC 3.6 (L) 02/11/2022 0953   RBC 4.35 02/11/2022 0953   HGB 13.5 02/11/2022 0953   HGB 14.7 10/12/2021 1027   HCT 41.3 02/11/2022 0953   HCT 43.9 10/12/2021 1027   PLT 74 (L) 02/11/2022 0953   PLT 110 (L) 10/12/2021 1027   MCV 94.9 02/11/2022 0953   MCV 93 10/12/2021 1027   MCH 31.0 02/11/2022 0953   MCHC 32.7 02/11/2022 0953   RDW 13.6 02/11/2022 0953   RDW 14.4 10/12/2021 1027   LYMPHSABS 0.9 02/11/2022 0953   LYMPHSABS 1.0 10/12/2021 1027   MONOABS 0.3 02/11/2022 0953   EOSABS 0.1 02/11/2022 0953   EOSABS 0.1 10/12/2021 1027   BASOSABS 0.0 02/11/2022 0953   BASOSABS 0.0 10/12/2021 1027    CMP     Component Value Date/Time   NA 140 04/11/2022 0901   NA 142 02/21/2022 0957   K 4.1 04/11/2022 0901   CL 104 04/11/2022 0901   CO2 28 04/11/2022 0901   GLUCOSE 94 04/11/2022 0901   BUN 35 (H) 04/11/2022 0901   BUN 20 02/21/2022 0957   CREATININE 1.12 04/11/2022 0901   CALCIUM 9.1 04/11/2022 0901   PROT 6.9 10/12/2021 1027   ALBUMIN 4.5 10/12/2021 1027   AST 18 10/12/2021 1027   ALT 14 10/12/2021 1027   ALKPHOS 102 10/12/2021 1027   BILITOT 2.2 (H) 10/12/2021 1027   GFRNONAA >60 04/11/2022 0901   GFRAA >60 11/10/2019 1050       Latest  Ref Rng & Units 02/11/2022    9:53 AM 10/12/2021   10:27 AM 05/24/2021   11:01 AM  CBC EXTENDED  WBC 4.0 - 10.5 K/uL 3.6  5.0  4.7   RBC 4.22 - 5.81 MIL/uL 4.35  4.74  4.36   Hemoglobin 13.0 - 17.0 g/dL 84.1  32.4  40.1   HCT 39.0 - 52.0 % 41.3  43.9  39.4   Platelets 150 - 400 K/uL 74  110  110   NEUT# 1.7 - 7.7 K/uL 2.3  3.5    Lymph# 0.7 - 4.0 K/uL 0.9  1.0        ASSESSMENT AND PLAN: 82 year old male with significant cardiac history to include coronary artery disease status post CABG, atrial fibrillation and combined systolic/diastolic heart failure, also with several years of significant diarrhea with urgency and incontinence with associated weight loss.  He is status postcholecystectomy, so bile acid diarrhea is very high on the differential.  He reports being given a powder in the past which did not help with his diarrhea.  This was presumably cholestyramine. His symptoms are also suggestive of SIBO, but he does not have risk factors for this.  Microscopic colitis has been ruled out. 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  I recommend we treat with 2 weeks of rifaximin.  I would like to get the detailed records from [facility] to see what other evaluation and treatment has been done thus far. Will follow-up with the patient in 6 to 8  weeks and consider further evaluation if rifaximin does not help.  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 Tomasa Rand, MD [facility]  I spent a total of 45 minutes reviewing the patient's medical record, interviewing and examining the patient, discussing her diagnosis and management of his condition going forward, and documenting in the medical record  CC:  Chambliss, Estill Batten, *   ADDENDUM:  Following the patient's appointment, he brought printed records from [facility]:  Patient was initially seen by Dr. Randa Evens in September 2019.  This note indicates difficulty obtaining a  coherent history.  His primary symptom at that time seem to be him more related, and he was given hydrocortisone cream. He followed up in February 2020 at which time he was complaining of excessive gas.  He was recommended to try Beano and Lactaid and to track his diet. His next encounter appeared to be a flexible sigmoidoscopy for diarrhea.  This was unremarkable and random biopsies were normal. He was then seen in June 2020 with reports of diarrhea, urgency and incontinence. Note indicate negative GI pathogen and normal lactoferrin.  He was prescribed Questran, but notes indicate he was not taking it.  At that visit, they again discussed Questran and needs for dose modification to bulk up stool.  He was seen again April 2021, this time with Dr. Evette Cristal with a primary complaint of rectal bleeding and perianal discomfort.  He was offered suppositories, but declined.  He was thus referred to colorectal surgery. He saw Dr. Evette Cristal again in May 2021, and his note indicates that the colorectal surgeon did not appreciate prominent hemorrhoids and did not feel that was the source of his bleeding.  He therefore underwent a repeat colonoscopy and upper endoscopy at that time (procedure/results described above) He followed up again in July 2021 and was prescribed Robinul Forte tablets and Metamucil for IBS symptoms (loose stools, urgency). Marland Kitchen He followed up again in October 2021, this time with Dr. Dulce Sellar, reporting ongoing symptoms of gas, bloating and frequent stools with urgency.  Robinul caused urinary retention.  He was then advised to try the low FODMAP diet and recommended to try FD guard.  He saw Dr. Dulce Sellar again in December 2021 with ongoing symptoms, and was prescribed rifaximin.  This is the last encounter from [facility].

## 2022-08-14 ENCOUNTER — Telehealth: Payer: Self-pay

## 2022-08-14 NOTE — Telephone Encounter (Signed)
Left VM for patient letting them know that Xifaxan samples were at the office for him to pick up.

## 2022-08-15 NOTE — Telephone Encounter (Signed)
Returned patient's call and let him know that it was okay to take medication morning ,afternoon , and evening. I let patient know that he could call back if he had anymore questions.

## 2022-08-15 NOTE — Telephone Encounter (Signed)
Inbound call from patient requesting a call back to discuss when to take Xifaxan medication. Would like to know if he should take it at the exact time of his other medications in the morning, afternoon, and evening. Please advise, thank you.

## 2022-08-27 ENCOUNTER — Telehealth: Payer: Self-pay | Admitting: Gastroenterology

## 2022-08-27 NOTE — Telephone Encounter (Signed)
Inbound call from patient's wife requesting a call back regarding Xifaxan medication. Stated patient is almost finished with medication. She believes he should take it for a little while longer. Requesting a call back to discuss further. Please advise, thank you.

## 2022-08-29 NOTE — Telephone Encounter (Signed)
Inbound call from patient's wife requesting a call back to be advised on what to do regarding medication. Would like a call back as soon as possible. Advised Dr. Tomasa Rand has been at the hospital this week. Requesting a call back to discuss further. Please advise, thank you.

## 2022-08-29 NOTE — Telephone Encounter (Signed)
Inbound call from patient stating he received a phone call but no voice mail was left. Please advise, thank you.

## 2022-09-03 ENCOUNTER — Other Ambulatory Visit: Payer: Self-pay

## 2022-09-03 MED ORDER — COLESTIPOL HCL 1 G PO TABS: 1.0000 g | ORAL_TABLET | Freq: Two times a day (BID) | ORAL | 1 refills | Status: DC

## 2022-09-03 NOTE — Telephone Encounter (Signed)
Contacted patient and his wife. They stated they would like to try cholestyramine or colestipol which ever one you would recommend. He did have some Diarrhea this weekend but stated it was not too bad.

## 2022-09-04 NOTE — Telephone Encounter (Signed)
Contacted patient and his wife and reassured them that Colestipol does not commonly cause weight loss and that if patient did lose weight then patient could stop colestipol.

## 2022-09-04 NOTE — Telephone Encounter (Signed)
PT wife is calling back with concerns about new medication he started. She saw it causes weight loss and she feels he doesn't need to lose anymore. She wants to know if he should continue the medication. She was also told that there would be two medications called in for him and they only receibed one. Requesting call back

## 2022-09-04 NOTE — Telephone Encounter (Signed)
Inbound call from patient's wife stating Viberzi medication has more side effects since patient does not have a gallbladder. Please advise, thank you.

## 2022-09-20 ENCOUNTER — Ambulatory Visit: Payer: PPO | Attending: Cardiovascular Disease

## 2022-09-20 DIAGNOSIS — Z951 Presence of aortocoronary bypass graft: Secondary | ICD-10-CM

## 2022-09-20 DIAGNOSIS — Z5181 Encounter for therapeutic drug level monitoring: Secondary | ICD-10-CM | POA: Diagnosis not present

## 2022-09-20 LAB — POCT INR: INR: 2.3 (ref 2.0–3.0)

## 2022-09-20 NOTE — Patient Instructions (Signed)
Description   Continue taking Warfarin 1 tablet daily.  Recheck INR in 10 weeks per pt request.  Call coumadin clinic for any changes in medications or upcoming procedures.  Coumadin Clinic [phone]

## 2022-09-24 ENCOUNTER — Telehealth (HOSPITAL_COMMUNITY): Payer: Self-pay

## 2022-09-24 ENCOUNTER — Other Ambulatory Visit (HOSPITAL_COMMUNITY): Payer: Self-pay

## 2022-09-24 NOTE — Telephone Encounter (Signed)
Advanced Heart Failure Patient Advocate Encounter  Returned pt call regarding PAN grant (low on funds). Recommended using [employer] grant as this will cover [place] Custard, Metoprolol, Spironolactone, and have a higher coverage amount for the year. Patient is agreeable to this change.  He currently has apx 2 months of medication in stock. To avoid inactivity after approval, I will submit for [employer] grant on 10/1, have new rxs for heart meds sent in to be delivered by [facility] to the address on file, confirm copays on the medications, and call the pt to update him.  Burnell Blanks, CPhT Rx Patient Advocate Phone: [phone]

## 2022-09-26 ENCOUNTER — Telehealth: Payer: Self-pay | Admitting: Cardiovascular Disease

## 2022-09-26 MED ORDER — ENTRESTO 49-51 MG PO TABS: 1.0000 | ORAL_TABLET | Freq: Two times a day (BID) | ORAL | Status: DC

## 2022-09-26 NOTE — Telephone Encounter (Signed)
Patient calling the office for samples of medication:   1.  What medication and dosage are you requesting samples for? sacubitril-valsartan (ENTRESTO) 49-51 MG  2.  Are you currently out of this medication?   No

## 2022-09-26 NOTE — Telephone Encounter (Signed)
Left message for patient to call back  

## 2022-09-26 NOTE — Telephone Encounter (Signed)
Called patient back patient stated he is in the doughnut hole and needs samples for Entresto. Will give patient one bottle of samples until he sees the [facility] Failure Clinic on 10/11/22. At that time he can discuss what they are going to do about his medications. Will give patient information for patient assistance for Entresto. Will also send message to nurse who helps with these forms.

## 2022-09-26 NOTE — Telephone Encounter (Signed)
Patient returned RN's call. 

## 2022-10-10 ENCOUNTER — Telehealth: Payer: Self-pay | Admitting: Cardiovascular Disease

## 2022-10-10 ENCOUNTER — Other Ambulatory Visit: Payer: Self-pay | Admitting: Cardiovascular Disease

## 2022-10-10 ENCOUNTER — Other Ambulatory Visit (HOSPITAL_COMMUNITY): Payer: Self-pay | Admitting: Cardiology

## 2022-10-10 ENCOUNTER — Other Ambulatory Visit (HOSPITAL_COMMUNITY): Payer: Self-pay

## 2022-10-10 DIAGNOSIS — I4821 Permanent atrial fibrillation: Secondary | ICD-10-CM

## 2022-10-10 MED ORDER — ATORVASTATIN CALCIUM 10 MG PO TABS: 10.0000 mg | ORAL_TABLET | Freq: Every day | ORAL | 3 refills | Status: DC

## 2022-10-10 NOTE — Telephone Encounter (Signed)
Left message for patient to call back  

## 2022-10-10 NOTE — Telephone Encounter (Signed)
Called patient back to let him know he is still on Lipitor and will send medication in to his pharmacy. Patient stated he would be interested in switching to [facility]. Will send them a message to call patient.

## 2022-10-10 NOTE — Telephone Encounter (Signed)
Pt c/o medication issue:  1. Name of Medication:           atorvastatin (LIPITOR) 20 MG tablet     2. How are you currently taking this medication (dosage and times per day)? TAKE 1/2 TABLET BY MOUTH DAILY   3. Are you having a reaction (difficulty breathing--STAT)? No  4. What is your medication issue? Patient is calling because he picked up this medication from the pharmacy and only received 5 pills. Patient is unsure if he should stop the medication. Please advise.

## 2022-10-10 NOTE — Progress Notes (Signed)
PCP: Carney Living, MD Cardiology: Dr. Eden Emms HF Cardiology: Dr. Shirlee Latch  82 y.o. with history of CAD s/p CABG, ischemic cardiomyopathy, and permanent atrial fibrillation referred by Dr. Eden Emms for evaluation of CHF.  Patient had CABG x 5 in 8/18.  Post-op echo showed EF 35%. Since that time, LV EF has ranged from 25-35%.  Most recent echo in 1/24 showed EF 25-30%, moderate LV dilation, severely decreased RV systolic function, severe LAE, moderate MR. He had post-op atrial fibrillation.  He failed amiodarone and cardioversion, and it was decided not to pursue AF ablation.  He is in permanent atrial fibrillation.  In 12/23, he had a presyncopal episode.  Zio monitor was placed showing AF rate ranging 29 - 100 bpm, 46 pauses with the longest 4 seconds (at night). Also 28 NSVT runs, longest 18 beats.   Seen for initial HF visit in 02/24. Volume stable. Started on Farxiga.   He is here today for follow-up. Has been stable from HF standpoint. Notes a little bit of shortness of breath walking up the stairs to his house after working out in the yard. No dyspnea with ADLs. No orthopnea, PND or lower extremity edema. Has lasix to use PRN, takes rarely. Occasionally gets lightheaded if he stands up too quickly. No presyncope or syncope. Compliant with all medications.  Has lost about 40 lb over the last few years. Reports eating less d/t discomfort from frequent flatulence and diarrhea. Has been following with GI.   Labs (1/24): K 4.6, creatinine 1.15  ECG (personally reviewed): Atrial fibrillation rate 54, RBBB, LAFB  PMH:  1. CAD: S/p CABG 8/18 with LIMA-LAD, SVG-D2, SVG-OM1, seq SVG-OM2 and OM3.  2. Atrial fibrillation: Post-op CABG initially.  Failed amiodarone and cardioversion. Decided against ablation in 2020. Now permanent.  3. Chronic systolic CHF: Ischemic cardiomyopathy.  - Echo (1/19): EF 35% - Echo (2020): EF 30-35% - Echo (8/22): EF 30-35%, moderate-severe MR - Echo (1/24): EF  25-30%, moderate LV dilation, severely decreased RV systolic function, severe LAE, moderate MR.  4. 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  Moderate-severe on 8/22 echo but moderate on 1/24 echo.  5. Bradycardia: Presyncopal episode in 12/23.  Zio monitor (1/24) with AF rate ranging 29 - 100 bpm, 46 pauses with the longest 4 seconds (at night).  6. NSVT: 28 NSVT runs on 1/24 Zio monitor, longest 18 beats.  7. Mild dementia 8. 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  Married.  Quit smoking remotely.  Rare ETOH.   Family History  Problem Relation Age of Onset   COPD Father    Emphysema Father    Healthy Brother    Diabetes Neg Hx    Cancer Neg Hx    Stomach cancer Neg Hx    Colon cancer Neg Hx    ROS: All systems reviewed and negative except as per HPI.   Current Outpatient Medications  Medication Sig Dispense Refill   atorvastatin (LIPITOR) 10 MG tablet Take 1 tablet (10 mg total) by mouth daily. 90 tablet 1   colestipol (COLESTID) 1 g tablet Take 1 tablet (1 g total) by mouth 2 (two) times daily. Take this medication at least 1 hour after taking your other medications. 60 tablet 1   dapagliflozin propanediol (FARXIGA) 10 MG TABS tablet Take 1 tablet (10 mg total) by mouth daily before breakfast. 90 tablet 3   ENTRESTO 49-51 MG TAKE 1 TABLET BY MOUTH 2 (TWO) TIMES DAILY. NEEDS APPOINTMENT WITH PROVIDER FOR FUTURE REFILLS.  180 tablet 0   furosemide (LASIX) 40 MG tablet Take 40 mg by mouth as needed for edema.     metoprolol succinate (TOPROL-XL) 25 MG 24 hr tablet Take 0.5 tablets (12.5 mg total) by mouth daily. 45 tablet 1   oxymetazoline (AFRIN) 0.05 % nasal spray Place 1 spray into both nostrils at bedtime.     rifaximin (XIFAXAN) 550 MG TABS tablet Take 1 tablet (550 mg total) by mouth 3 (three) times daily. 41 tablet 0   sacubitril-valsartan (ENTRESTO) 49-51 MG Take 1 tablet by mouth 2 (two) times daily. 180 tablet 1   spironolactone (ALDACTONE) 25 MG tablet  Take 1 tablet (25 mg total) by mouth daily. 90 tablet 1   warfarin (COUMADIN) 5 MG tablet Take 1 tablet (5 mg total) by mouth daily, as directed by the coumadin clinic. 35 tablet 1   No current facility-administered medications for this encounter.   BP 100/60   Pulse (!) 48   Wt 68.8 kg (151 lb 9.6 oz)   SpO2 100%   BMI 20.00 kg/m  General:  Thin elderly male. Wife is present. HEENT: normal Neck: supple. no JVD.  Cor: PMI nondisplaced. Regular rate & rhythm. No rubs, gallops or murmurs. Lungs: clear Abdomen: soft, nontender, nondistended.  Extremities: no cyanosis, clubbing, rash, no edema Neuro: alert & oriented x 3. Affect pleasant  EKG: Atrial fibrillation 55 bpm, PVCs, RBBB, LAFB  Assessment/Plan: 1. Chronic systolic CHF: Ischemic cardiomyopathy.  Echoes dating from 2018 have shown EF ranging 25-35%.  Most recent echo in 1/24 showed EF 25-30%, moderate LV dilation, severely decreased RV systolic function, severe LAE, moderate MR. Despite biventricular failure, he is minimally symptomatic (NYHA class II).  He is not volume overloaded on exam and rarely takes prn Lasix.  Occasional lightheadedness with position changes. Watch for signs of dehydration, may need to scale back GDMT. - Continue Entresto 49/51 bid.  - Off digoxin with bradycardia, would stay off.  - Continue low dose Toprol XL 12.5 mg daily.  Would like to keep this going but will not increase with bradycardia (see below).  - Continue spironolactone 25 mg daily.  - Continue Farxiga 10 mg daily.   - Not CRT candidate with RBBB.  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  - Labs today 2. NSVT: 28 NSVT runs on 1/24 Zio monitor, longest 18 beats.  3. Weight loss: Has lost about 40 lb over the last few years. Avoids eating due to flatulence and diarrhea. Followed by GI for IBS. 4. Atrial fibrillation: Permanent.  Failed amiodarone and DCCV, and ablation was decided against.  No GI bleeding.   Recent Zio monitor showed heart rate ranging 29-100 bpm.  - Continue warfarin, could consider Eliquis transition in the future.  5. Bradycardia: HR as low as 29 bpm on 1/24 Zio monitor, also with 46 pauses (longest 4 seconds).  Most of these episodes occurred at night while sleeping.  He is not interested in sleep study/CPAP.  He had a presyncopal episode in 12/23 prior to wearing monitor.  He is off digoxin now and Toprol XL has been cut back to 12.5 mg daily.  - Would like to continue low dose Toprol XL for now.  - Followup with Dr. Lalla Brothers, to consider pacemaker though he really has not had recent symptoms referable to bradycardia (? Occasional dizziness more orthostatic in nature).  Referral submitted. He is not a CRT candidate with RBBB.  6. 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  Most recent echo in 1/24 showed moderate MR. Follow for now.  7. CAD: CABG x 5 in 2018.  No chest pain.  - No ASA given stable CAD and warfarin use.  - Continue atorvastatin.   Followup 6 months with Dr. Shirlee Latch  [facility], Lillia Abed N 10/11/2022

## 2022-10-11 ENCOUNTER — Ambulatory Visit (HOSPITAL_COMMUNITY)
Admission: RE | Admit: 2022-10-11 | Discharge: 2022-10-11 | Disposition: A | Discharge status: Home / Self Care | Payer: PPO | Source: Ambulatory Visit | Attending: Physician Assistant | Admitting: Physician Assistant

## 2022-10-11 ENCOUNTER — Other Ambulatory Visit (HOSPITAL_COMMUNITY): Payer: Self-pay

## 2022-10-11 ENCOUNTER — Other Ambulatory Visit: Payer: Self-pay

## 2022-10-11 ENCOUNTER — Encounter (HOSPITAL_COMMUNITY): Payer: Self-pay

## 2022-10-11 VITALS — BP 100/60 | HR 48 | Wt 151.6 lb

## 2022-10-11 DIAGNOSIS — Z79899 Other long term (current) drug therapy: Secondary | ICD-10-CM | POA: Diagnosis not present

## 2022-10-11 DIAGNOSIS — I495 Sick sinus syndrome: Secondary | ICD-10-CM | POA: Diagnosis not present

## 2022-10-11 DIAGNOSIS — I5022 Chronic systolic (congestive) heart failure: Secondary | ICD-10-CM | POA: Diagnosis not present

## 2022-10-11 DIAGNOSIS — Z87891 Personal history of nicotine dependence: Secondary | ICD-10-CM | POA: Diagnosis not present

## 2022-10-11 DIAGNOSIS — I255 Ischemic cardiomyopathy: Secondary | ICD-10-CM | POA: Diagnosis not present

## 2022-10-11 DIAGNOSIS — I5082 Biventricular heart failure: Secondary | ICD-10-CM | POA: Diagnosis not present

## 2022-10-11 DIAGNOSIS — Z7984 Long term (current) use of oral hypoglycemic drugs: Secondary | ICD-10-CM | POA: Insufficient documentation

## 2022-10-11 DIAGNOSIS — I452 Bifascicular block: Secondary | ICD-10-CM | POA: Diagnosis not present

## 2022-10-11 DIAGNOSIS — Z951 Presence of aortocoronary bypass graft: Secondary | ICD-10-CM | POA: Insufficient documentation

## 2022-10-11 DIAGNOSIS — I4821 Permanent atrial fibrillation: Secondary | ICD-10-CM | POA: Diagnosis not present

## 2022-10-11 DIAGNOSIS — Z7901 Long term (current) use of anticoagulants: Secondary | ICD-10-CM | POA: Diagnosis not present

## 2022-10-11 DIAGNOSIS — Z6820 Body mass index (BMI) 20.0-20.9, adult: Secondary | ICD-10-CM | POA: Insufficient documentation

## 2022-10-11 DIAGNOSIS — R634 Abnormal weight loss: Secondary | ICD-10-CM | POA: Diagnosis not present

## 2022-10-11 DIAGNOSIS — K580 Irritable bowel syndrome with diarrhea: Secondary | ICD-10-CM | POA: Diagnosis not present

## 2022-10-11 DIAGNOSIS — I2510 Atherosclerotic heart disease of native coronary artery without angina pectoris: Secondary | ICD-10-CM

## 2022-10-11 DIAGNOSIS — I340 Nonrheumatic mitral (valve) insufficiency: Secondary | ICD-10-CM | POA: Diagnosis not present

## 2022-10-11 LAB — BASIC METABOLIC PANEL
Anion gap: 9 (ref 5–15)
BUN: 18 mg/dL (ref 8–23)
CO2: 28 mmol/L (ref 22–32)
Calcium: 9.4 mg/dL (ref 8.9–10.3)
Chloride: 102 mmol/L (ref 98–111)
Creatinine, Ser: 1.29 mg/dL — ABNORMAL HIGH (ref 0.61–1.24)
GFR, Estimated: 56 mL/min — ABNORMAL LOW (ref >60)
Glucose, Bld: 91 mg/dL (ref 70–99)
Potassium: 4.5 mmol/L (ref 3.5–5.1)
Sodium: 139 mmol/L (ref 135–145)

## 2022-10-11 LAB — BRAIN NATRIURETIC PEPTIDE: B Natriuretic Peptide: 712.3 pg/mL — ABNORMAL HIGH (ref 0.0–100.0)

## 2022-10-11 MED ORDER — ENTRESTO 49-51 MG PO TABS
1.0000 | ORAL_TABLET | Freq: Two times a day (BID) | ORAL | 1 refills | Status: DC
Start: 2022-10-11 — End: 2023-04-03
  Filled 2022-10-11 – 2022-12-06 (×5): qty 180, 90d supply, fill #0
  Filled 2023-02-15 – 2023-03-19 (×2): qty 180, 90d supply, fill #1

## 2022-10-11 MED ORDER — ATORVASTATIN CALCIUM 10 MG PO TABS
10.0000 mg | ORAL_TABLET | Freq: Every day | ORAL | 1 refills | Status: DC
  Filled 2022-10-11 – 2023-03-19 (×2): qty 90, 90d supply, fill #0
  Filled 2023-06-24: qty 90, 90d supply, fill #1

## 2022-10-11 MED ORDER — SPIRONOLACTONE 25 MG PO TABS
25.0000 mg | ORAL_TABLET | Freq: Every day | ORAL | 1 refills | Status: DC
Start: 2022-10-11 — End: 2023-09-12
  Filled 2022-10-11 – 2023-03-19 (×3): qty 90, 90d supply, fill #0
  Filled 2023-06-24: qty 90, 90d supply, fill #1

## 2022-10-11 MED ORDER — WARFARIN SODIUM 5 MG PO TABS
5.0000 mg | ORAL_TABLET | Freq: Every day | ORAL | 1 refills | Status: DC
Start: 2022-10-11 — End: 2022-10-31
  Filled 2022-10-11: qty 35, 35d supply, fill #0

## 2022-10-11 MED ORDER — METOPROLOL SUCCINATE ER 25 MG PO TB24
12.5000 mg | ORAL_TABLET | Freq: Every day | ORAL | 1 refills | Status: DC
Start: 2022-10-11 — End: 2023-03-29
  Filled 2022-10-11 – 2023-03-19 (×4): qty 45, 90d supply, fill #0

## 2022-10-11 NOTE — Addendum Note (Signed)
Encounter addended by: Andrey Farmer, PA-C on: 10/11/2022 5:10 PM  Actions taken: Order Reconciliation Section accessed

## 2022-10-11 NOTE — Telephone Encounter (Signed)
Patient and wife expressed concerns about donut hold/ copay while in office for appointment. Enrolled in the grant, and new rxs sent to [facility] for delivery.  The patient was approved for a [employer] grant that will help cover the cost of Entresto, Farxiga, Metoprolol, Spironolactone.  Total amount awarded, $10,000.  Effective: 09/11/22 - 09/10/23.  BIN [id number] PCN [id number] [id number] ID [id number]  Pharmacy provided with approval and processing information. Patient informed in office.  Burnell Blanks, CPhT Rx Patient Advocate Phone: [phone]

## 2022-10-11 NOTE — Telephone Encounter (Addendum)
Prescription refill request received for warfarin Lov: 03/29/2022, 02/05/2022 Nishan Next INR check: 11/29/2022 Warfarin tablet strength: 5mg 

## 2022-10-11 NOTE — Patient Instructions (Addendum)
Medication Changes:  No Changes In Medications at this time.   Lab Work:  Labs done today, your results will be available in MyChart, we will contact you for abnormal readings.  Referrals:  YOU HAVE BEEN REFERRED TO ELECTROPHYSIOLOGY THEY WILL REACH OUT TO YOU OR CALL TO ARRANGE THIS. PLEASE CALL us WITH ANY CONCERNS   Follow-Up in: 6 MONTHS WITH DR. Shirlee Latch. PLEASE CALL OUR OFFICE AROUND NOVEMBER TO GET SCHEDULED FOR YOUR APPOINTMENT. PHONE NUMBER IS [phone] OPTION 2    At the [facility], you and your health needs are our priority. We have a designated team specialized in the treatment of Heart Failure. This Care Team includes your primary Heart Failure Specialized Cardiologist (physician), Advanced Practice Providers (APPs- Physician Assistants and Nurse Practitioners), and Pharmacist who all work together to provide you with the care you need, when you need it.   You may see any of the following providers on your designated Care Team at your next follow up:  Dr. Arvilla Meres Dr. Marca Ancona Dr. Marcos Eke, NP Robbie Lis, [place] [facility] [place], [place] Brynda Peon, NP Karle Plumber, PharmD   Please be sure to bring in all your medications bottles to every appointment.   Need to Contact us:  If you have any questions or concerns before your next appointment please send [place] a message through [place] or call our office at [phone].    TO LEAVE A MESSAGE FOR THE NURSE SELECT OPTION 2, PLEASE LEAVE A MESSAGE INCLUDING: YOUR NAME DATE OF BIRTH CALL BACK NUMBER REASON FOR CALL**this is important as we prioritize the call backs  YOU WILL RECEIVE A CALL BACK THE SAME DAY AS LONG AS YOU CALL BEFORE 4:00 PM

## 2022-10-12 ENCOUNTER — Other Ambulatory Visit (HOSPITAL_COMMUNITY): Payer: Self-pay

## 2022-10-20 ENCOUNTER — Other Ambulatory Visit (HOSPITAL_COMMUNITY): Payer: Self-pay

## 2022-10-29 ENCOUNTER — Other Ambulatory Visit: Payer: Self-pay | Admitting: Gastroenterology

## 2022-10-31 ENCOUNTER — Other Ambulatory Visit: Payer: Self-pay | Admitting: Cardiovascular Disease

## 2022-10-31 DIAGNOSIS — I4821 Permanent atrial fibrillation: Secondary | ICD-10-CM

## 2022-10-31 NOTE — Telephone Encounter (Signed)
Refill request for warfarin:  Last INR was 2.3 on 09/20/22 Next INR due 11/29/22 LOV was 03/29/22  Refill approved.

## 2022-11-05 NOTE — Progress Notes (Unsigned)
    SUBJECTIVE:   CHIEF COMPLAINT / HPI:   Weight loss His bowels are much improved with addition of new medication from Dr Tomasa Rand.  He is actually slowly gaining weight.  No vomiting or bleeding or fever  Skin lesions Has one on his forehead that grows and shrinks and sometimes bleeds.  Would like to be referred to his wife's dermatologist.  Also a lump on his back   Nasal congestion Still bothers him frequently with profuse watery discharge.  Kept under control at night so he can breath with afrin.  Has tried nasal steroids and antihistamines without help.  No bleeding or pain or purulent discharge    PERTINENT  PMH / PSH: Sees cardiology Zettie Pho) and GI Tomasa Rand) regularly   OBJECTIVE:   BP 123/74   Pulse 90   Ht 6\' 1"  (1.854 m)   Wt 157 lb 9.6 oz (71.5 kg)   SpO2 100%   BMI 20.79 kg/m   Ears:  External ear exam shows no significant lesions or deformities.  Otoscopic examination reveals clear canals, tympanic membranes are intact bilaterally without bulging, retraction, inflammation or discharge. Hearing is grossly normal bilaterall Nose - thinned nasal mucosa without lesions or evident discharge or bleeding Lungs:  Normal respiratory effort, chest expands symmetrically. Lungs are clear to auscultation, no crackles or wheezes. Skin  ASSESSMENT/PLAN:   Skin lesion Assessment & Plan: Forehead lesion consistent with hemangioma.  Will refer to dermatology per his wishes  Orders: -     Ambulatory referral to Dermatology  Chronic rhinitis Assessment & Plan: Unchanged.  Resistant to many medications.  While not desirable once daily afrin helps him breath at night.  Recommend adding nasal saline to prevent dryness and potential bleeding since on warfarin.   Offered ENT referral but he is not interested now.   Weight loss Assessment & Plan: Improved with decreased bowel movements.  No signs of cancer or infection.  Continue to monitor      Patient  Instructions  Good to see you today - Thank you for coming in  Things we discussed today:  Nose - use the afrin no more than once a day and use nasal saline at least twice a day  If you would like to be referred to an ENT doctor to see if any other treatments  I have put in a referral to Dermatology Dr Lovenia Kim.  They should call you to schedule an appointment.  This could appear as an unknown number on your phone.  If you have not heard from them in 2 weeks please let me know.   Come back in one year for physical    Carney Living, MD [facility]

## 2022-11-06 ENCOUNTER — Ambulatory Visit (INDEPENDENT_AMBULATORY_CARE_PROVIDER_SITE_OTHER): Payer: PPO | Admitting: Family Medicine

## 2022-11-06 ENCOUNTER — Telehealth: Payer: Self-pay | Admitting: Family Medicine

## 2022-11-06 VITALS — BP 123/74 | HR 90 | Ht 73.0 in | Wt 157.6 lb

## 2022-11-06 DIAGNOSIS — J310 Chronic rhinitis: Secondary | ICD-10-CM

## 2022-11-06 DIAGNOSIS — R634 Abnormal weight loss: Secondary | ICD-10-CM

## 2022-11-06 DIAGNOSIS — L989 Disorder of the skin and subcutaneous tissue, unspecified: Secondary | ICD-10-CM | POA: Insufficient documentation

## 2022-11-06 NOTE — Patient Instructions (Signed)
Good to see you today - Thank you for coming in  Things we discussed today:  Nose - use the afrin no more than once a day and use nasal saline at least twice a day  If you would like to be referred to an ENT doctor to see if any other treatments  I have put in a referral to Dermatology Dr Lovenia Kim.  They should call you to schedule an appointment.  This could appear as an unknown number on your phone.  If you have not heard from them in 2 weeks please let me know.   Come back in one year for physical

## 2022-11-06 NOTE — Telephone Encounter (Signed)
Delaine called and is the husband to one of Sara's patients ([name] Laing) and his current pcp is retiring and Huntley Dec recommended you to be his new pcp. Will this be ok?

## 2022-11-06 NOTE — Assessment & Plan Note (Signed)
Improved with decreased bowel movements.  No signs of cancer or infection.  Continue to monitor

## 2022-11-06 NOTE — Assessment & Plan Note (Signed)
Unchanged.  Resistant to many medications.  While not desirable once daily afrin helps him breath at night.  Recommend adding nasal saline to prevent dryness and potential bleeding since on warfarin.   Offered ENT referral but he is not interested now.

## 2022-11-06 NOTE — Assessment & Plan Note (Signed)
Forehead lesion consistent with hemangioma.  Will refer to dermatology per his wishes

## 2022-11-12 ENCOUNTER — Other Ambulatory Visit: Payer: Self-pay | Admitting: Pharmacist

## 2022-11-12 ENCOUNTER — Telehealth: Payer: Self-pay | Admitting: Pharmacist

## 2022-11-12 NOTE — Progress Notes (Signed)
Conducted chart review for potential MAD - Farxiga (dapagliflozin) - currently up to date with fills on 06/24/22 and 09/22/22.

## 2022-11-12 NOTE — Telephone Encounter (Signed)
Completed med review for adherence with Farxiga (dapagliflozin) - appears adherent with most recent fill date of 09/22/2022

## 2022-11-21 ENCOUNTER — Ambulatory Visit: Payer: PPO | Admitting: Cardiology

## 2022-11-27 NOTE — Progress Notes (Unsigned)
Electrophysiology Office Follow up Visit Note:    Date:  11/28/2022   ID:  [name], DOB [birth year], MRN [id number]  PCP:  Carney Living, MD  [facility] Cardiologist:  None  [facility] Electrophysiologist:  Lanier Prude, MD    Interval History:    Jason Day is a 82 y.o. male who presents for a follow up visit.   Last seen December 07, 2020.  The patient has a history of ischemic cardiomyopathy.  Also history of coronary disease post CABG, moderate MR, permanent atrial fibrillation and a right bundle branch block.  ICD has been previously discussed with the patient but he is declined.  At the last appointment, we discussed CRT-D but given his right bundle branch block on EKG and the QRS duration, I did not think he had a high likelihood of improved functional status with a CRT-D.  We elected to proceed with medical management alone.  He is with family today in clinic.  He reports no syncopal episode.       Past medical, surgical, social and family history were reviewed.  ROS:   Please see the history of present illness.    All other systems reviewed and are negative.  EKGs/Labs/Other Studies Reviewed:    The following studies were reviewed today:  January 2024 ZIO monitor personally reviewed.  Reviewed in person with the patient and his family.  Pauses are predominantly nocturnal.  Good heart rate excursion.  March 08, 2022 echo EF 25 to 30% RV severely reduced Severely dilated left atrium Moderate MR      Physical Exam:    VS:  BP 90/60 (BP Location: Left Arm, Patient Position: Sitting, Cuff Size: Normal)   Pulse (!) 57   Ht 6' (1.829 m)   Wt 156 lb 4 oz (70.9 kg)   SpO2 98%   BMI 21.19 kg/m     Wt Readings from Last 3 Encounters:  11/28/22 156 lb 4 oz (70.9 kg)  11/06/22 157 lb 9.6 oz (71.5 kg)  10/11/22 151 lb 9.6 oz (68.8 kg)     GEN:  Well nourished, well developed in no acute distress.  Elderly CARDIAC: Irregular rhythm, no murmurs,  rubs, gallops RESPIRATORY:  Clear to auscultation without rales, wheezing or rhonchi       ASSESSMENT:    1. Chronic systolic heart failure (HCC)   2. Tachycardia-bradycardia syndrome (HCC)   3. Permanent atrial fibrillation (HCC)   4. Syncope and collapse    PLAN:    In order of problems listed above:  #Chronic systolic heart failure NYHA class III.  Warm and dry on exam.  Continue spironolactone, Entresto, metoprolol, Lasix, Farxiga  #Permanent atrial fibrillation Continue warfarin  #Tachybradycardia syndrome Asymptomatic as of October 11, 2022 appointment with heart failure clinic.  His medications have been adjusted with improvement in symptoms.  Prior syncopal history does not sound arrhythmic. Discussed permanent pacing in detail again during today's clinic appointment.  For now, recommend continue with a conservative management strategy.  We discussed symptoms that would prompt immediate evaluation for reconsideration of permanent pacing.  He should present to the emergency department should he experience presyncope or syncope.  Follow-up with EP on an as-needed basis.     Signed, Steffanie Dunn, MD, [facility], [facility] 11/28/2022 11:45 AM    Electrophysiology [facility]

## 2022-11-28 ENCOUNTER — Encounter: Payer: Self-pay | Admitting: Cardiology

## 2022-11-28 ENCOUNTER — Ambulatory Visit: Payer: PPO | Attending: Cardiology | Admitting: Cardiology

## 2022-11-28 VITALS — BP 90/60 | HR 57 | Ht 72.0 in | Wt 156.2 lb

## 2022-11-28 DIAGNOSIS — R55 Syncope and collapse: Secondary | ICD-10-CM | POA: Diagnosis not present

## 2022-11-28 DIAGNOSIS — I5022 Chronic systolic (congestive) heart failure: Secondary | ICD-10-CM

## 2022-11-28 DIAGNOSIS — I495 Sick sinus syndrome: Secondary | ICD-10-CM

## 2022-11-28 DIAGNOSIS — I4821 Permanent atrial fibrillation: Secondary | ICD-10-CM

## 2022-11-28 NOTE — Patient Instructions (Signed)
Medication Instructions:  Your physician recommends that you continue on your current medications as directed. Please refer to the Current Medication list given to you today.  *If you need a refill on your cardiac medications before your next appointment, please call your pharmacy*  Follow-Up: At [facility], you and your health needs are our priority.  As part of our continuing mission to provide you with exceptional heart care, we have created designated Provider Care Teams.  These Care Teams include your primary Cardiologist (physician) and Advanced Practice Providers (APPs -  Physician Assistants and Nurse Practitioners) who all work together to provide you with the care you need, when you need it.  Your next appointment:   As needed with Dr. Lambert 

## 2022-11-29 ENCOUNTER — Ambulatory Visit: Payer: PPO

## 2022-11-29 DIAGNOSIS — Z5181 Encounter for therapeutic drug level monitoring: Secondary | ICD-10-CM | POA: Diagnosis not present

## 2022-11-29 DIAGNOSIS — Z951 Presence of aortocoronary bypass graft: Secondary | ICD-10-CM

## 2022-11-29 LAB — POCT INR: INR: 2.8 (ref 2.0–3.0)

## 2022-11-29 NOTE — Patient Instructions (Signed)
Description   Continue taking Warfarin 1 tablet daily.  Recheck INR in 10 weeks per pt request (aware of risk)  Call coumadin clinic for any changes in medications or upcoming procedures.  Coumadin Clinic [phone]

## 2022-12-03 ENCOUNTER — Telehealth: Payer: Self-pay | Admitting: Gastroenterology

## 2022-12-03 NOTE — Telephone Encounter (Signed)
Spoke with pt and let him know he is taking the colestipol for loose stools. He states he is not having loose stools. Discussed with him he can always stop the medication and see if he starts having issues again. If he does then he can resume the medication. He verbalized understanding.

## 2022-12-03 NOTE — Telephone Encounter (Signed)
Inbound call from patient requesting to know when he should stop taking colestipol medication. States he has been taking medication for 4 months. Please advise, thank you.

## 2022-12-04 ENCOUNTER — Ambulatory Visit: Payer: PPO | Admitting: *Deleted

## 2022-12-04 DIAGNOSIS — Z0000 Encounter for general adult medical examination without abnormal findings: Secondary | ICD-10-CM

## 2022-12-04 NOTE — Progress Notes (Signed)
Subjective:   Jason Day is a 82 y.o. male who presents for Medicare Annual/Subsequent preventive examination.  Visit Complete: Virtual I connected with  Jason Day on 12/04/22 by a audio enabled telemedicine application and verified that I am speaking with the correct person using two identifiers.  Patient Location: Home  Provider Location: Home Office  I discussed the limitations of evaluation and management by telemedicine. The patient expressed understanding and agreed to proceed.  Vital Signs: Because this visit was a virtual/telehealth visit, some criteria may be missing or patient reported. Any vitals not documented were not able to be obtained and vitals that have been documented are patient reported. Cardiac Risk Factors include: advanced age (>76men, >73 women);male gender     Objective:    There were no vitals filed for this visit. There is no height or weight on file to calculate BMI.     12/04/2022    8:40 AM 05/24/2021   10:36 AM 08/31/2020    9:30 AM 11/19/2019   11:28 AM 11/10/2019   10:33 AM 09/16/2019    2:53 PM 08/04/2019    8:13 AM  Advanced Directives  Does Patient Have a Medical Advance Directive? Yes No No Yes Yes Yes No  Type of Estate agent of [employer] Power of [place];Living will Healthcare Power of [place];Living will Living will;Healthcare Power of Attorney   Does patient want to make changes to medical advance directive?    No - Patient declined  No - Patient declined   Copy of Healthcare Power of Attorney in Chart? No - copy requested   No - copy requested  No - copy requested   Would patient like information on creating a medical advance directive?  No - Patient declined No - Patient declined   No - Patient declined No - Patient declined    Current Medications (verified) Outpatient Encounter Medications as of 12/04/2022  Medication Sig   atorvastatin (LIPITOR) 10 MG tablet Take 1 tablet (10 mg total) by mouth daily.    colestipol (COLESTID) 1 g tablet TAKE 1 TABLET (1 G TOTAL) BY MOUTH 2 (TWO) TIMES DAILY. TAKE THIS MEDICATION AT LEAST 1 HOUR AFTER TAKING YOUR OTHER MEDICATIONS.   metoprolol succinate (TOPROL-XL) 25 MG 24 hr tablet Take 0.5 tablets (12.5 mg total) by mouth daily.   sacubitril-valsartan (ENTRESTO) 49-51 MG Take 1 tablet by mouth 2 (two) times daily.   spironolactone (ALDACTONE) 25 MG tablet Take 1 tablet (25 mg total) by mouth daily.   warfarin (COUMADIN) 5 MG tablet TAKE 1 TABLET BY MOUTH DAILY AS DIRECTED BY THE COUMADIN CLINIC.   dapagliflozin propanediol (FARXIGA) 10 MG TABS tablet Take 1 tablet (10 mg total) by mouth daily before breakfast.   furosemide (LASIX) 40 MG tablet Take 40 mg by mouth as needed for edema.   oxymetazoline (AFRIN) 0.05 % nasal spray Place 1 spray into both nostrils at bedtime.   rifaximin (XIFAXAN) 550 MG TABS tablet Take 1 tablet (550 mg total) by mouth 3 (three) times daily.   No facility-administered encounter medications on file as of 12/04/2022.    Allergies (verified) Patient has no known allergies.   History: Past Medical History:  Diagnosis Date   Acute combined systolic and diastolic congestive heart failure (HCC) 10/16/2016   Allergic rhinitis 09/01/2020   Anal fissure    Bleeding hemorrhoids    Chronic combined systolic and diastolic heart failure (HCC)    Chronic sinus complaints    overuses afrin  Coronary atherosclerosis of native coronary artery    Dyspnea    Encounter for medical examination to establish care 04/22/2017   Gallstones    Hyperlipidemia    Irritable bowel syndrome 06/07/2017   Ischemic cardiomyopathy    Left atrial enlargement    Long term (current) use of anticoagulants 12/30/2020   Moderate mitral regurgitation    Myocardial infarction (HCC) 09/2015   Persistent atrial fibrillation (HCC)    RBBB    A- Fib   Shortness of breath 12/29/2020   Urinary incontinence 12/30/2020   Past Surgical History:  Procedure  Laterality Date   BACK SURGERY  ~2014   disc repair   CARDIOVERSION N/A 10/24/2018   Procedure: CARDIOVERSION;  Surgeon: Jake Bathe, MD;  Location: [facility] ENDOSCOPY;  Service: Cardiovascular;  Laterality: N/A;   CHOLECYSTECTOMY     COLONOSCOPY  04/06/2014   Hemorrhoids and diverticulosis performed in [place]   COLONOSCOPY WITH PROPOFOL N/A 08/04/2019   Procedure: COLONOSCOPY WITH PROPOFOL;  Surgeon: Graylin Shiver, MD;  Location: [facility] ENDOSCOPY;  Service: Endoscopy;  Laterality: N/A;   CORONARY ARTERY BYPASS GRAFT N/A 10/18/2016   Procedure: CORONARY ARTERY BYPASS GRAFTING (CABG) x five , using left internal mammary artery and right leg greater saphenous vein harvested endoscopically;  Surgeon: Alleen Borne, MD;  Location: [facility] OR;  Service: Open Heart Surgery;  Laterality: N/A;   ESOPHAGOGASTRODUODENOSCOPY (EGD) WITH PROPOFOL N/A 08/04/2019   Procedure: ESOPHAGOGASTRODUODENOSCOPY (EGD) WITH PROPOFOL;  Surgeon: Graylin Shiver, MD;  Location: [facility] ENDOSCOPY;  Service: Endoscopy;  Laterality: N/A;   FOOT SURGERY     HEMORRHOID BANDING     HEMORRHOID SURGERY     REVERSE SHOULDER ARTHROPLASTY Left 08/21/2018   Procedure: REVERSE SHOULDER ARTHROPLASTY;  Surgeon: Francena Hanly, MD;  Location: [facility] ORS;  Service: Orthopedics;  Laterality: Left;   RIGHT/LEFT HEART CATH AND CORONARY ANGIOGRAPHY N/A 10/16/2016   Procedure: RIGHT/LEFT HEART CATH AND CORONARY ANGIOGRAPHY;  Surgeon: Lyn Records, MD;  Location: [facility] INVASIVE CV LAB;  Service: Cardiovascular;  Laterality: N/A;   stents in liver     TEE WITHOUT CARDIOVERSION N/A 10/18/2016   Procedure: TRANSESOPHAGEAL ECHOCARDIOGRAM (TEE);  Surgeon: Alleen Borne, MD;  Location: [facility] OR;  Service: Open Heart Surgery;  Laterality: N/A;   TRANSURETHRAL RESECTION OF PROSTATE N/A 11/19/2019   Procedure: CYSTOSCOPY TRANSURETHRAL RESECTION OF THE PROSTATE (TURP);  Surgeon: Bjorn Pippin, MD;  Location: [facility] ORS;  Service: Urology;  Laterality: N/A;   Family History  Problem  Relation Age of Onset   COPD Father    Emphysema Father    Healthy Brother    Diabetes Neg Hx    Cancer Neg Hx    Stomach cancer Neg Hx    Colon cancer Neg Hx    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  Tobacco Use   Smoking status: Former    Current packs/day: 0.00    Types: Cigarettes    Quit date: 1970    Years since quitting: 54.8   Smokeless tobacco: Never   Tobacco comments:    Quit in 1970  Vaping Use   Vaping status: Never Used  Substance and Sexual Activity   Alcohol use: No   Drug use: No  Sexual activity: Yes  Other Topics Concern   Not on file  Social History Narrative   Married    Originally from [place] moved to [place] age 89 moved to [place] 2018   1 son Hady Niemczyk in [place]   1dtr [place] in [place]       Has living will   Wife is health care POA--then son   Would accept resuscitation   No tube feeds if cognitively unaware   Social Determinants of Health   Financial Resource Strain: Low Risk  (12/04/2022)   Overall Financial Resource Strain (CARDIA)    Difficulty of Paying Living Expenses: Not hard at all  Food Insecurity: No Food Insecurity (12/04/2022)   Hunger Vital Sign    Worried About Running Out of Food in the Last Year: Never true    Ran Out of Food in the Last Year: Never true  Transportation Needs: No Transportation Needs (12/04/2022)   PRAPARE - Administrator, Civil Service (Medical): No    Lack of Transportation (Non-Medical): No  Physical Activity: Insufficiently Active (12/04/2022)   Exercise Vital Sign    Days of Exercise per Week: 3 days    Minutes of Exercise per Session: 30 min  Stress: No Stress Concern Present (12/04/2022)   [employer] of Occupational  Health - Occupational Stress Questionnaire    Feeling of Stress : Not at all  Social Connections: Moderately Isolated (12/04/2022)   Social Connection and Isolation Panel [NHANES]    Frequency of Communication with Friends and Family: Never    Frequency of Social Gatherings with Friends and Family: Twice a week    Attends Religious Services: More than 4 times per year    Active Member of [employer] or Organizations: No    Attends Engineer, structural: Never    Marital Status: Married    Tobacco Counseling Counseling given: Not Answered Tobacco comments: Quit in 1970   Clinical Intake:  Pre-visit preparation completed: Yes  Pain : No/denies pain     Diabetes: No  How often do you need to have someone help you when you read instructions, pamphlets, or other written materials from your doctor or pharmacy?: 1 - Never  Interpreter Needed?: No  Information entered by :: Remi Haggard LPN   Activities of Daily Living    12/04/2022    8:42 AM  In your present state of health, do you have any difficulty performing the following activities:  Hearing? 1  Vision? 0  Difficulty concentrating or making decisions? 0  Walking or climbing stairs? 0  Dressing or bathing? 0  Doing errands, shopping? 0  Preparing Food and eating ? N  Using the Toilet? N  In the past six months, have you accidently leaked urine? N  Do you have problems with loss of bowel control? N  Managing your Medications? N  Managing your Finances? N  Housekeeping or managing your Housekeeping? N    Patient Care Team: Carney Living, MD as PCP - General (Family Medicine) Lanier Prude, MD as PCP - Electrophysiology (Cardiology) Wendall Stade, MD as Consulting Physician (Cardiology) Willis Modena, MD as Consulting Physician (Gastroenterology) Francena Hanly, MD as Consulting Physician (Orthopedic Surgery) Bjorn Pippin, MD as Attending Physician (Urology)  Indicate any recent Medical  Services you may have received from other than [facility] providers in the past year (date may be approximate).     Assessment:   This is a routine wellness examination for [place].  Hearing/Vision  screen Hearing Screening - Comments:: Some trouble hearing Does not have hearing aids Vision Screening - Comments:: Has had cataract surgery  Wears readers  Has not seen eye doctor in 3 years   Goals Addressed             This Visit's Progress    Patient Stated       Continue current lifestyle       Depression Screen    12/04/2022    8:39 AM 06/28/2021    9:16 AM 12/30/2020    8:09 AM 08/31/2020    9:29 AM 04/22/2017    1:31 PM 03/20/2017   12:41 PM 12/17/2016    1:15 PM  PHQ 2/9 Scores  PHQ - 2 Score 0 0 0 1 0 0 0  PHQ- 9 Score 0 0 7 5       Fall Risk    12/04/2022    8:36 AM 11/06/2022    9:16 AM 10/12/2021    9:00 AM 05/24/2021   10:32 AM 12/30/2020    8:09 AM  Fall Risk   Falls in the past year? 0 0 0 0 0  Number falls in past yr: 0 0 0 0 0  Injury with Fall? 0 0 0 0 0  Risk for fall due to :   No Fall Risks  Impaired mobility  Follow up Falls evaluation completed;Education provided;Falls prevention discussed  Education provided;Falls evaluation completed  Falls evaluation completed;Education provided    MEDICARE RISK AT HOME: Medicare Risk at Home Any stairs in or around the home?: Yes If so, are there any without handrails?: No Home free of loose throw rugs in walkways, pet beds, electrical cords, etc?: Yes Adequate lighting in your home to reduce risk of falls?: Yes Life alert?: No Use of a cane, walker or w/c?: No Grab bars in the bathroom?: No Shower chair or bench in shower?: No Elevated toilet seat or a handicapped toilet?: No  TIMED UP AND GO:  Was the test performed?  No    Cognitive Function:        12/04/2022    8:50 AM 12/04/2022    8:42 AM  6CIT Screen  What Year? 0 points 0 points  What month? 0 points 0 points  What time? 0 points 0 points   Count back from 20 2 points 0 points  Months in reverse 4 points 0 points  Repeat phrase  0 points  Total Score  0 points    Immunizations Immunization History  Administered Date(s) Administered   Fluad Quad(high Dose 65+) 11/07/2021   Influenza, High Dose Seasonal PF 11/06/2016, 10/15/2018, 10/15/2018, 11/03/2019, 10/31/2020   Influenza-Unspecified 12/16/2020   PFIZER Comirnaty(Gray Top)Covid-19 Tri-Sucrose Vaccine 05/22/2020   PFIZER(Purple Top)SARS-COV-2 Vaccination 03/15/2019, 04/06/2019, 11/03/2019   PNEUMOCOCCAL CONJUGATE-20 12/29/2020   Pfizer Covid-19 Vaccine Bivalent Booster 24yrs & up 06/21/2021   Pfizer(Comirnaty)Fall Seasonal Vaccine 12 years and older 10/23/2022   Respiratory Syncytial Virus Vaccine,Recomb Aduvanted(Arexvy) 12/03/2021   Zoster Recombinant(Shingrix) 04/19/2016, 06/22/2016, 09/17/2021    TDAP status: Due, Education has been provided regarding the importance of this vaccine. Advised may receive this vaccine at local pharmacy or Health Dept. Aware to provide a copy of the vaccination record if obtained from local pharmacy or Health Dept. Verbalized acceptance and understanding.  Flu Vaccine status: Up to date  Pneumococcal vaccine status: Up to date  Covid-19 vaccine status: Information provided on how to obtain vaccines.   Qualifies for Shingles Vaccine? No   Zostavax completed Yes  Shingrix Completed?: Yes  Screening Tests Health Maintenance  Topic Date Due   DTaP/Tdap/Td (1 - Tdap) Never done   COVID-19 Vaccine (7 - 2023-24 season) 02/22/2023   Medicare Annual Wellness (AWV)  12/04/2023   Pneumonia Vaccine 107+ Years old  Completed   INFLUENZA VACCINE  Completed   Zoster Vaccines- Shingrix  Completed   HPV VACCINES  Aged Out   Hepatitis C Screening  Discontinued    Health Maintenance  Health Maintenance Due  Topic Date Due   DTaP/Tdap/Td (1 - Tdap) Never done    Colorectal cancer screening: No longer required.   Lung Cancer  Screening: (Low Dose CT Chest recommended if Age 62-80 years, 20 pack-year currently smoking OR have quit w/in 15years.) does not qualify.   Lung Cancer Screening Referral:   Additional Screening:  Hepatitis C Screening:  Never Done   Vision Screening: Recommended annual ophthalmology exams for early detection of glaucoma and other disorders of the eye. Is the patient up to date with their annual eye exam?  No  Who is the provider or what is the name of the office in which the patient attends annual eye exams?  If pt is not established with a provider, would they like to be referred to a provider to establish care? No .   Dental Screening: Recommended annual dental exams for proper oral hygiene    Community Resource Referral / Chronic Care Management: CRR required this visit?  No   CCM required this visit?  No     Plan:     I have personally reviewed and noted the following in the patient's chart:   Medical and social history Use of alcohol, tobacco or illicit drugs  Current medications and supplements including opioid prescriptions. Patient is not currently taking opioid prescriptions. Functional ability and status Nutritional status Physical activity Advanced directives List of other physicians Hospitalizations, surgeries, and ER visits in previous 12 months Vitals Screenings to include cognitive, depression, and falls Referrals and appointments  In addition, I have reviewed and discussed with patient certain preventive protocols, quality metrics, and best practice recommendations. A written personalized care plan for preventive services as well as general preventive health recommendations were provided to patient.     Remi Haggard, LPN   16/11/9602   After Visit Summary: (MyChart) Due to this being a telephonic visit, the after visit summary with patients personalized plan was offered to patient via MyChart   Nurse Notes:

## 2022-12-04 NOTE — Patient Instructions (Signed)
[name] , Thank you for taking time to come for your Medicare Wellness Visit. I appreciate your ongoing commitment to your health goals. Please review the following plan we discussed and let me know if I can assist you in the future.   Screening recommendations/referrals:  Colonoscopy: no longer required Recommended yearly ophthalmology/optometry visit for glaucoma screening and checkup Recommended yearly dental visit for hygiene and checkup  Vaccinations: Influenza vaccine: up to date Pneumococcal vaccine: up to date Tdap vaccine: Education provided Shingles vaccine: up to date    Advanced directives: Education provided   Preventive Care 65 Years and Older, Male Preventive care refers to lifestyle choices and visits with your health care provider that can promote health and wellness. What does preventive care include? A yearly physical exam. This is also called an annual well check. Dental exams once or twice a year. Routine eye exams. Ask your health care provider how often you should have your eyes checked. Personal lifestyle choices, including: Daily care of your teeth and gums. Regular physical activity. Eating a healthy diet. Avoiding tobacco and drug use. Limiting alcohol use. Practicing safe sex. Taking low doses of aspirin every day. Taking vitamin and mineral supplements as recommended by your health care provider. What happens during an annual well check? The services and screenings done by your health care provider during your annual well check will depend on your age, overall health, lifestyle risk factors, and family history of disease. Counseling  Your health care provider may ask you questions about your: Alcohol use. Tobacco use. Drug use. Emotional well-being. Home and relationship well-being. Sexual activity. Eating habits. History of falls. Memory and ability to understand (cognition). Work and work Astronomer. Screening  You may have the following  tests or measurements: Height, weight, and BMI. Blood pressure. Lipid and cholesterol levels. These may be checked every 5 years, or more frequently if you are over 45 years old. Skin check. Lung cancer screening. You may have this screening every year starting at age 25 if you have a 30-pack-year history of smoking and currently smoke or have quit within the past 15 years. Fecal occult blood test (FOBT) of the stool. You may have this test every year starting at age 59. Flexible sigmoidoscopy or colonoscopy. You may have a sigmoidoscopy every 5 years or a colonoscopy every 10 years starting at age 51. Prostate cancer screening. Recommendations will vary depending on your family history and other risks. Hepatitis C blood test. Hepatitis B blood test. Sexually transmitted disease (STD) testing. Diabetes screening. This is done by checking your blood sugar (glucose) after you have not eaten for a while (fasting). You may have this done every 1-3 years. Abdominal aortic aneurysm (AAA) screening. You may need this if you are a current or former smoker. Osteoporosis. You may be screened starting at age 54 if you are at high risk. Talk with your health care provider about your test results, treatment options, and if necessary, the need for more tests. Vaccines  Your health care provider may recommend certain vaccines, such as: Influenza vaccine. This is recommended every year. Tetanus, diphtheria, and acellular pertussis (Tdap, Td) vaccine. You may need a Td booster every 10 years. Zoster vaccine. You may need this after age 86. Pneumococcal 13-valent conjugate (PCV13) vaccine. One dose is recommended after age 62. Pneumococcal polysaccharide (PPSV23) vaccine. One dose is recommended after age 3. Talk to your health care provider about which screenings and vaccines you need and how often you need them. This  information is not intended to replace advice given to you by your health care provider.  Make sure you discuss any questions you have with your health care provider. Document Released: 03/04/2015 Document Revised: 10/26/2015 Document Reviewed: 12/07/2014 Elsevier Interactive Patient Education  2017 [employer].  Fall Prevention in the Home Falls can cause injuries. They can happen to people of all ages. There are many things you can do to make your home safe and to help prevent falls. What can I do on the outside of my home? Regularly fix the edges of walkways and driveways and fix any cracks. Remove anything that might make you trip as you walk through a door, such as a raised step or threshold. Trim any bushes or trees on the path to your home. Use bright outdoor lighting. Clear any walking paths of anything that might make someone trip, such as rocks or tools. Regularly check to see if handrails are loose or broken. Make sure that both sides of any steps have handrails. Any raised decks and porches should have guardrails on the edges. Have any leaves, snow, or ice cleared regularly. Use sand or salt on walking paths during winter. Clean up any spills in your garage right away. This includes oil or grease spills. What can I do in the bathroom? Use night lights. Install grab bars by the toilet and in the tub and shower. Do not use towel bars as grab bars. Use non-skid mats or decals in the tub or shower. If you need to sit down in the shower, use a plastic, non-slip stool. Keep the floor dry. Clean up any water that spills on the floor as soon as it happens. Remove soap buildup in the tub or shower regularly. Attach bath mats securely with double-sided non-slip rug tape. Do not have throw rugs and other things on the floor that can make you trip. What can I do in the bedroom? Use night lights. Make sure that you have a light by your bed that is easy to reach. Do not use any sheets or blankets that are too big for your bed. They should not hang down onto the floor. Have a  firm chair that has side arms. You can use this for support while you get dressed. Do not have throw rugs and other things on the floor that can make you trip. What can I do in the kitchen? Clean up any spills right away. Avoid walking on wet floors. Keep items that you use a lot in easy-to-reach places. If you need to reach something above you, use a strong step stool that has a grab bar. Keep electrical cords out of the way. Do not use floor polish or wax that makes floors slippery. If you must use wax, use non-skid floor wax. Do not have throw rugs and other things on the floor that can make you trip. What can I do with my stairs? Do not leave any items on the stairs. Make sure that there are handrails on both sides of the stairs and use them. Fix handrails that are broken or loose. Make sure that handrails are as long as the stairways. Check any carpeting to make sure that it is firmly attached to the stairs. Fix any carpet that is loose or worn. Avoid having throw rugs at the top or bottom of the stairs. If you do have throw rugs, attach them to the floor with carpet tape. Make sure that you have a light switch at the top  of the stairs and the bottom of the stairs. If you do not have them, ask someone to add them for you. What else can I do to help prevent falls? Wear shoes that: Do not have high heels. Have rubber bottoms. Are comfortable and fit you well. Are closed at the toe. Do not wear sandals. If you use a stepladder: Make sure that it is fully opened. Do not climb a closed stepladder. Make sure that both sides of the stepladder are locked into place. Ask someone to hold it for you, if possible. Clearly mark and make sure that you can see: Any grab bars or handrails. First and last steps. Where the edge of each step is. Use tools that help you move around (mobility aids) if they are needed. These include: Canes. Walkers. Scooters. Crutches. Turn on the lights when you  go into a dark area. Replace any light bulbs as soon as they burn out. Set up your furniture so you have a clear path. Avoid moving your furniture around. If any of your floors are uneven, fix them. If there are any pets around you, be aware of where they are. Review your medicines with your doctor. Some medicines can make you feel dizzy. This can increase your chance of falling. Ask your doctor what other things that you can do to help prevent falls. This information is not intended to replace advice given to you by your health care provider. Make sure you discuss any questions you have with your health care provider. Document Released: 12/02/2008 Document Revised: 07/14/2015 Document Reviewed: 03/12/2014 Elsevier Interactive Patient Education  2017 [employer].

## 2022-12-06 ENCOUNTER — Other Ambulatory Visit (HOSPITAL_COMMUNITY): Payer: Self-pay

## 2022-12-06 ENCOUNTER — Other Ambulatory Visit: Payer: Self-pay | Admitting: Cardiovascular Disease

## 2022-12-06 ENCOUNTER — Other Ambulatory Visit: Payer: Self-pay

## 2022-12-06 DIAGNOSIS — I4821 Permanent atrial fibrillation: Secondary | ICD-10-CM

## 2022-12-06 MED ORDER — WARFARIN SODIUM 5 MG PO TABS
5.0000 mg | ORAL_TABLET | Freq: Every day | ORAL | 2 refills | Status: DC
  Filled 2022-12-06: qty 35, 35d supply, fill #0

## 2022-12-06 NOTE — Telephone Encounter (Signed)
Warfarin 5mg  refill; last sent 10/31/22 30 day supply with 5 refills Afib Last INR  11/29/22 Last OV 11/28/22  Confirmed with pt that he wants warfarin refill sent to [facility] now.

## 2022-12-07 ENCOUNTER — Telehealth: Payer: Self-pay

## 2022-12-07 NOTE — Telephone Encounter (Signed)
Patient calls nurse line very upset in regards to Dermatology referral.   He reports a referral was placed "over one month ago" and he still does not have an apt scheduled.   He reports he spoke with [facility] and they denied ever receiving a referral from [place].   It appears referral was sent to [facility] back in September.   Will forward to referral coordinator for assistance.

## 2022-12-07 NOTE — Telephone Encounter (Signed)
Re faxed referral to [facility]. Referral has also been sent to [facility]. Mychart message has been sent to pt to inform and update.  Jason Day

## 2022-12-20 ENCOUNTER — Other Ambulatory Visit: Payer: Self-pay | Admitting: Pharmacist

## 2022-12-20 NOTE — Progress Notes (Signed)
Patient chart reviewed for medication adherence with Farxiga (dapagliflozin).  Chart review reveals that patient has recently filled 09/22/2022 for #90 and is current - meeting goal adherence at this time.

## 2022-12-28 ENCOUNTER — Other Ambulatory Visit: Payer: Self-pay | Admitting: Gastroenterology

## 2023-01-01 ENCOUNTER — Other Ambulatory Visit (HOSPITAL_COMMUNITY): Payer: Self-pay

## 2023-01-10 DIAGNOSIS — C44319 Basal cell carcinoma of skin of other parts of face: Secondary | ICD-10-CM | POA: Diagnosis not present

## 2023-01-10 DIAGNOSIS — L720 Epidermal cyst: Secondary | ICD-10-CM | POA: Diagnosis not present

## 2023-01-10 DIAGNOSIS — D1801 Hemangioma of skin and subcutaneous tissue: Secondary | ICD-10-CM | POA: Diagnosis not present

## 2023-01-10 DIAGNOSIS — D692 Other nonthrombocytopenic purpura: Secondary | ICD-10-CM | POA: Diagnosis not present

## 2023-01-10 DIAGNOSIS — L570 Actinic keratosis: Secondary | ICD-10-CM | POA: Diagnosis not present

## 2023-01-10 DIAGNOSIS — D485 Neoplasm of uncertain behavior of skin: Secondary | ICD-10-CM | POA: Diagnosis not present

## 2023-01-10 DIAGNOSIS — L821 Other seborrheic keratosis: Secondary | ICD-10-CM | POA: Diagnosis not present

## 2023-01-10 DIAGNOSIS — B351 Tinea unguium: Secondary | ICD-10-CM | POA: Diagnosis not present

## 2023-01-16 NOTE — Progress Notes (Signed)
Cardiology Office Note    Date:  01/30/2023   ID:  Jason Day, DOB 1941/01/14, MRN [id number]  PCP:  Carney Living, MD  Cardiologist:  Dr. Eden Emms   Chief Complaint: Edema / CHF   History of Present Illness:   Jason Day is a 82 y.o. male CAD s/p CABG, ICM, PAF,  MR and HLD seen in f/u   He initially presented 09/2016 w/ acute pulmonary edema and diuresed. Echo showed reduced LVEF, down to 30%, leading to LHC, which showed severe multivessel CAD. 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 Post of afib >> on amiodarone and coumadin. He is claustrophobic and cannot have MRI for EF calculation  He has had recurrent afib and failed medical Rx with amiodarone LA 51 mm diameter on TTE Seen by [facility] 12/25/18 and ablation deferred plan for rate control and anticoagulation strategy Xarelto too expensive on coumadin now   Last echo  03/08/22 EF 25-30% severe RV reduction severe LAE moderate MR and AV sclerosis   The patient had L rotator cuff arthropathy 08/21/18.   Currently his edema is from LE venous disease and not CHF  Had benign upper endoscopy and colonoscopy 08/04/19 Dr Evette Cristal  Also had some urinary retention requiring foley and cystoscopy  Wife notes some behavioral issues and very argumentative   Called office 12/2020 and complained of increasing dyspnea PND and orthopnea has been taking his aldactone and lasix  His baseline ECG shows afib with LAD and RBBB Seen by Dr Lalla Brothers  11/28/22 and did not think he would benefit from CRT pacing given fairly narrow RBBB Patient declined AICD despite indications for it.   He needs some coumadin   Past Medical History:  Diagnosis Date   Acute combined systolic and diastolic congestive heart failure (HCC) 10/16/2016   Allergic rhinitis 09/01/2020   Anal fissure    Bleeding hemorrhoids    Chronic combined systolic and diastolic heart failure (HCC)    Chronic sinus complaints    overuses afrin   Coronary atherosclerosis of native coronary artery     Dyspnea    Encounter for medical examination to establish care 04/22/2017   Gallstones    Hyperlipidemia    Irritable bowel syndrome 06/07/2017   Ischemic cardiomyopathy    Left atrial enlargement    Long term (current) use of anticoagulants 12/30/2020   Moderate mitral regurgitation    Myocardial infarction (HCC) 09/2015   Persistent atrial fibrillation (HCC)    RBBB    A- Fib   Shortness of breath 12/29/2020   Urinary incontinence 12/30/2020    Past Surgical History:  Procedure Laterality Date   BACK SURGERY  ~2014   disc repair   CARDIOVERSION N/A 10/24/2018   Procedure: CARDIOVERSION;  Surgeon: Jake Bathe, MD;  Location: [facility] ENDOSCOPY;  Service: Cardiovascular;  Laterality: N/A;   CHOLECYSTECTOMY     COLONOSCOPY  04/06/2014   Hemorrhoids and diverticulosis performed in [place]   COLONOSCOPY WITH PROPOFOL N/A 08/04/2019   Procedure: COLONOSCOPY WITH PROPOFOL;  Surgeon: Graylin Shiver, MD;  Location: [facility] ENDOSCOPY;  Service: Endoscopy;  Laterality: N/A;   CORONARY ARTERY BYPASS GRAFT N/A 10/18/2016   Procedure: CORONARY ARTERY BYPASS GRAFTING (CABG) x five , using left internal mammary artery and right leg greater saphenous vein harvested endoscopically;  Surgeon: Alleen Borne, MD;  Location: [facility] OR;  Service: Open Heart Surgery;  Laterality: N/A;   ESOPHAGOGASTRODUODENOSCOPY (EGD) WITH PROPOFOL N/A 08/04/2019   Procedure: ESOPHAGOGASTRODUODENOSCOPY (EGD) WITH PROPOFOL;  Surgeon: Graylin Shiver,  MD;  Location: [facility] ENDOSCOPY;  Service: Endoscopy;  Laterality: N/A;   FOOT SURGERY     HEMORRHOID BANDING     HEMORRHOID SURGERY     REVERSE SHOULDER ARTHROPLASTY Left 08/21/2018   Procedure: REVERSE SHOULDER ARTHROPLASTY;  Surgeon: Francena Hanly, MD;  Location: [facility] ORS;  Service: Orthopedics;  Laterality: Left;   RIGHT/LEFT HEART CATH AND CORONARY ANGIOGRAPHY N/A 10/16/2016   Procedure: RIGHT/LEFT HEART CATH AND CORONARY ANGIOGRAPHY;  Surgeon: Lyn Records, MD;  Location: [facility] INVASIVE CV  LAB;  Service: Cardiovascular;  Laterality: N/A;   stents in liver     TEE WITHOUT CARDIOVERSION N/A 10/18/2016   Procedure: TRANSESOPHAGEAL ECHOCARDIOGRAM (TEE);  Surgeon: Alleen Borne, MD;  Location: [facility] OR;  Service: Open Heart Surgery;  Laterality: N/A;   TRANSURETHRAL RESECTION OF PROSTATE N/A 11/19/2019   Procedure: CYSTOSCOPY TRANSURETHRAL RESECTION OF THE PROSTATE (TURP);  Surgeon: Bjorn Pippin, MD;  Location: [facility] ORS;  Service: Urology;  Laterality: N/A;    Current Medications:  Prior to Admission medications   Medication Sig Start Date End Date Taking? Authorizing Provider  atorvastatin (LIPITOR) 20 MG tablet Take 0.5 tablets (10 mg total) by mouth daily. Patient taking differently: Take 10 mg by mouth every evening.  07/29/18  Yes Wendall Stade, MD  digoxin (LANOXIN) 0.125 MG tablet Take 0.5 tablets (62.5 mcg total) by mouth daily. 06/26/18  Yes Wendall Stade, MD  metoprolol succinate (TOPROL-XL) 25 MG 24 hr tablet Take 0.5 tablets (12.5 mg total) by mouth daily. Patient taking differently: Take 12.5 mg by mouth at bedtime.  06/26/18  Yes Wendall Stade, MD  sacubitril-valsartan (ENTRESTO) 49-51 MG Take 1 tablet by mouth 2 (two) times daily. 07/29/18  Yes Wendall Stade, MD  spironolactone (ALDACTONE) 25 MG tablet Take 0.5 tablets (12.5 mg total) by mouth daily. Patient taking differently: Take 12.5 mg by mouth every evening.  06/26/18  Yes Wendall Stade, MD  warfarin (COUMADIN) 5 MG tablet Take as directed by [facility] Patient taking differently: Take 5 mg by mouth every evening. Take as directed by [facility]mad[facility] Clinic 06/26/18  Yes Wendall Stade, MD   Allergies:   Patient has no known allergies.   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  Tobacco  Use   Smoking status: Former    Current packs/day: 0.00    Types: Cigarettes    Quit date: 1970    Years since quitting: 54.9   Smokeless tobacco: Never   Tobacco comments:    Quit in 1970  Vaping Use   Vaping status: Never Used  Substance and Sexual Activity   Alcohol use: No   Drug use: No   Sexual activity: Yes  Other Topics Concern   Not on file  Social History Narrative   Married    Originally from [place] moved to [place] age 34 moved to [place] 2018   1 son Willis [place] in [place]   1dtr [place] in [place]       Has living will   Wife is health care POA--then son   Would accept resuscitation   No tube feeds if cognitively unaware   Social Determinants of Health   Financial Resource Strain: Low Risk  (  12/04/2022)   Overall Financial Resource Strain (CARDIA)    Difficulty of Paying Living Expenses: Not hard at all  Food Insecurity: No Food Insecurity (12/04/2022)   Hunger Vital Sign    Worried About Running Out of Food in the Last Year: Never true    Ran Out of Food in the Last Year: Never true  Transportation Needs: No Transportation Needs (12/04/2022)   PRAPARE - Administrator, Civil Service (Medical): No    Lack of Transportation (Non-Medical): No  Physical Activity: Insufficiently Active (12/04/2022)   Exercise Vital Sign    Days of Exercise per Week: 3 days    Minutes of Exercise per Session: 30 min  Stress: No Stress Concern Present (12/04/2022)   [employer] of Occupational Health - Occupational Stress Questionnaire    Feeling of Stress : Not at all  Social Connections: Moderately Isolated (12/04/2022)   Social Connection and Isolation Panel [NHANES]    Frequency of Communication with Friends and Family: Never    Frequency of Social Gatherings with Friends and Family: Twice a week    Attends Religious Services: More than 4 times per year    Active Member of [employer] or Organizations: No    Attends Banker  Meetings: Never    Marital Status: Married     Family History:  The patient's family history includes COPD in his father; Emphysema in his father; Healthy in his brother.   ROS:   Please see the history of present illness.    ROS All other systems reviewed and are negative.   PHYSICAL EXAM:   VS:  BP 102/60   Pulse (!) 56   Ht 6' (1.829 m)   Wt 155 lb 3.2 oz (70.4 kg)   SpO2 92%   BMI 21.05 kg/m     Argumentative  Chronically ill male HEENT: normal Neck supple with no adenopathy JVP normal no bruits no thyromegaly Lungs clear with no wheezing and good diaphragmatic motion Heart:  S1/S2 apical MR murmur, no rub, gallop or click PMI  Enlarged  post sternotomy  Abdomen: benighn, BS positve, no tenderness, no AAA no bruit.  No HSM or HJR Foley catheter strapped to right leg  Distal pulses intact with no bruits Plus one edema bad varicosities bilaterally chronic stasis  Neuro non-focal Skin warm and dry No muscular weakness   Wt Readings from Last 3 Encounters:  01/30/23 155 lb 3.2 oz (70.4 kg)  11/28/22 156 lb 4 oz (70.9 kg)  11/06/22 157 lb 9.6 oz (71.5 kg)      Studies/Labs Reviewed:   EKG:  9/10/21afib RBBB LAD old IMI rate 66   Recent Labs: 02/05/2022: NT-Pro BNP 3,420 02/11/2022: Hemoglobin 13.5; Platelets 74 10/11/2022: B Natriuretic Peptide 712.3; BUN 18; Creatinine, Ser 1.29; Potassium 4.5; Sodium 139   Lipid Panel    Component Value Date/Time   CHOL 103 10/12/2021 1027   TRIG 61 10/12/2021 1027   HDL 44 10/12/2021 1027   CHOLHDL 2.3 10/12/2021 1027   CHOLHDL 6 04/22/2017 1431   VLDL 9 10/15/2016 1155   LDLCALC 45 10/12/2021 1027   LDLDIRECT 119.0 04/22/2017 1431    Additional studies/ records that were reviewed today include:   Echo  03/08/22   IMPRESSIONS     1. Left ventricular ejection fraction, by estimation, is 25 to 30%. Left  ventricular ejection fraction by PLAX is 26 %. The left ventricle has  severely decreased function. The left  ventricle demonstrates global  hypokinesis.  The left ventricular internal   cavity size was moderately dilated. There is moderate asymmetric left  ventricular hypertrophy of the basal-septal segment. Indeterminate  diastolic filling due to E-A fusion.   2. Right ventricular systolic function is severely reduced. The right  ventricular size is mildly enlarged. There is normal pulmonary artery  systolic pressure. The estimated right ventricular systolic pressure is  33.6 mmHg.   3. Left atrial size was severely dilated.   4. Right atrial size was mildly dilated.   5. The mitral valve is degenerative. Moderate mitral valve regurgitation.   6. Significant thickening of the non-coronary cusp. The aortic valve is  tricuspid. There is moderate thickening of the aortic valve. Aortic valve  regurgitation is not visualized. No aortic stenosis is present. Aortic  valve mean gradient measures 4.0  mmHg.   7. The pulmonic valve was abnormal.   8. Aortic dilatation noted. There is borderline dilatation of the  ascending aorta, measuring 38 mm.   9. The inferior vena cava is normal in size with <50% respiratory  variability, suggesting right atrial pressure of 8 mmHg.   Comparison(s): Changes from prior study are noted. 10/05/2020: LVEF 30-35%,  global HK, grade 2DD.    ASSESSMENT & PLAN:    1. Acute on chronic combined CHF  - LVEF of 25-30% with moderate  MR  Both Dr Johney Frame and Lalla Brothers deferred CRT pacing given age and narrow RBBB Euvolemic on good medical Rx Getting assistance with Entresto patient defers AICD Continue GDMT May have issues affording entresto in a couple months   2. CAD s/p CABG -Patient denies any anginal symptoms.  Should be on 81 mg ASA   3 Chronic Afib  - on coumadin , Xarelto too expensive Failed cardioversion and medical Rx with amiodarone Seen by Dr Johney Frame 12/25/18 and ablation deferred adopted strategy of rate control and anticoagulation LA dimension by echo 51 mm INR Rx  2.8 on 11/29/22  5.  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 Edema:  Discussed seeing vein doctors at [facility] Compression stockings and diuretic with elevation of legs   7. Urology:  F/u post cystoscopy / foley out   8. Behavioral:  needs to f/u with primary as he has dementia with behavioral issues that are getting worse. Discussed taking to primary about appetite stimulant as well Suggested Boost supplement   9. Bradycardia:  no indication for pacing per Dr Lalla Brothers RBBB slow afib tolerating 12.5 mg Toprol for ischemic DCM Digoxin stopped and Toprol dose decreased Longest nightly pause on monitor 03/12/22 4 seconds Currently no daytime symptoms follow closely for PPM need At that time would need to convince him of benefit AICD/PPM 3.8% PVC;s on monitor   Medication Adjustments/Labs and Tests Ordered:  None   F/U in 6 months   Signed, Charlton Haws, MD  01/30/2023 10:24 AM    [facility] [address] [place], [place], [place]  [address] Phone: [phone]; Fax: [phone]

## 2023-01-23 ENCOUNTER — Telehealth: Payer: Self-pay | Admitting: Gastroenterology

## 2023-01-23 NOTE — Telephone Encounter (Signed)
Pt calling states he has decreased his colestid to 1 every other day. Reports he is having loose stools now. Asked pt if he had increased the dose back up to see if the diarrhea improved but he has not. Pt wants to know If Dr. 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 He states when he started the colestid at 2 daily it did help his symptoms but they did not completely go away. Pt wants to know what Dr. Tomasa Rand recommends. Please advise.

## 2023-01-23 NOTE — Telephone Encounter (Signed)
Inbound call from patient stating that he has some questions regarding medications. Please advise.

## 2023-01-24 NOTE — Telephone Encounter (Signed)
Unable to reach pt by phone. Message sent to pt with Dr. Milas Hock recommendations through [facility].

## 2023-01-30 ENCOUNTER — Other Ambulatory Visit: Payer: Self-pay

## 2023-01-30 ENCOUNTER — Ambulatory Visit: Payer: PPO | Attending: Cardiovascular Disease | Admitting: Cardiovascular Disease

## 2023-01-30 ENCOUNTER — Encounter: Payer: Self-pay | Admitting: Cardiovascular Disease

## 2023-01-30 VITALS — BP 102/60 | HR 56 | Ht 72.0 in | Wt 155.2 lb

## 2023-01-30 DIAGNOSIS — I4821 Permanent atrial fibrillation: Secondary | ICD-10-CM | POA: Diagnosis not present

## 2023-01-30 DIAGNOSIS — I420 Dilated cardiomyopathy: Secondary | ICD-10-CM | POA: Diagnosis not present

## 2023-01-30 DIAGNOSIS — Z951 Presence of aortocoronary bypass graft: Secondary | ICD-10-CM

## 2023-01-30 MED ORDER — WARFARIN SODIUM 5 MG PO TABS
5.0000 mg | ORAL_TABLET | Freq: Every day | ORAL | 0 refills | Status: DC
Start: 2023-01-30 — End: 2023-03-19
  Filled 2023-01-30: qty 68, 60d supply, fill #0

## 2023-01-30 NOTE — Patient Instructions (Signed)
Medication Instructions:  Your physician recommends that you continue on your current medications as directed. Please refer to the Current Medication list given to you today.  *If you need a refill on your cardiac medications before your next appointment, please call your pharmacy*  Lab Work: If you have labs (blood work) drawn today and your tests are completely normal, you will receive your results only by: MyChart Message (if you have MyChart) OR A paper copy in the mail If you have any lab test that is abnormal or we need to change your treatment, we will call you to review the results.  Testing/Procedures: None ordered today.  Follow-Up: At [facility], you and your health needs are our priority.  As part of our continuing mission to provide you with exceptional heart care, we have created designated Provider Care Teams.  These Care Teams include your primary Cardiologist (physician) and Advanced Practice Providers (APPs -  Physician Assistants and Nurse Practitioners) who all work together to provide you with the care you need, when you need it.  We recommend signing up for the patient portal called "MyChart".  Sign up information is provided on this After Visit Summary.  MyChart is used to connect with patients for Virtual Visits (Telemedicine).  Patients are able to view lab/test results, encounter notes, upcoming appointments, etc.  Non-urgent messages can be sent to your provider as well.   To learn more about what you can do with MyChart, go to [email or web address].    Your next appointment:   6 month(s)  Provider:   Dr. Eden Emms

## 2023-01-30 NOTE — Addendum Note (Signed)
Addended by: Virl Axe, Jazari Ober L on: 01/30/2023 10:37 AM   Modules accepted: Orders

## 2023-01-31 ENCOUNTER — Other Ambulatory Visit: Payer: Self-pay

## 2023-01-31 ENCOUNTER — Other Ambulatory Visit (HOSPITAL_COMMUNITY): Payer: Self-pay

## 2023-01-31 ENCOUNTER — Telehealth: Payer: Self-pay | Admitting: Pharmacist

## 2023-01-31 MED ORDER — DAPAGLIFLOZIN PROPANEDIOL 10 MG PO TABS
10.0000 mg | ORAL_TABLET | Freq: Every day | ORAL | 3 refills | Status: DC
Start: 2023-01-31 — End: 2023-11-25
  Filled 2023-01-31: qty 30, 30d supply, fill #0
  Filled 2023-02-15: qty 30, 30d supply, fill #1
  Filled 2023-02-25: qty 90, 90d supply, fill #1
  Filled 2023-02-25: qty 30, 30d supply, fill #1
  Filled 2023-04-05: qty 90, 90d supply, fill #1
  Filled 2023-06-24: qty 90, 90d supply, fill #2
  Filled 2023-10-01: qty 90, 90d supply, fill #3
  Filled 2023-11-25: qty 60, 60d supply, fill #4

## 2023-01-31 NOTE — Telephone Encounter (Signed)
Patient expressed some concern over his grant. [employer] grant is good until 09/10/23. He did get his Sherryll Burger in Oct for no cost from [employer]. He is not taking [place]. This can be billed under grant too.  Spoke with patient-made aware grant is still good Rx for [place] sent it.

## 2023-01-31 NOTE — Telephone Encounter (Signed)
Pt states the colestid is not working and he would like to try the viberzi. Please advise.

## 2023-02-01 ENCOUNTER — Other Ambulatory Visit: Payer: Self-pay

## 2023-02-01 ENCOUNTER — Other Ambulatory Visit (HOSPITAL_COMMUNITY): Payer: Self-pay

## 2023-02-01 MED ORDER — AMITRIPTYLINE HCL 10 MG PO TABS
10.0000 mg | ORAL_TABLET | Freq: Every day | ORAL | 1 refills | Status: DC
  Filled 2023-02-01: qty 90, 90d supply, fill #0
  Filled 2023-04-05: qty 90, 90d supply, fill #1

## 2023-02-01 MED ORDER — HYDROCORTISONE ACETATE 25 MG RE SUPP
25.0000 mg | Freq: Every day | RECTAL | 0 refills | Status: DC
Start: 2023-02-01 — End: 2023-02-28
  Filled 2023-02-01: qty 14, 14d supply, fill #0

## 2023-02-01 MED ORDER — PANCRELIPASE (LIP-PROT-AMYL) 36000-114000 UNITS PO CPEP
36000.0000 [IU] | ORAL_CAPSULE | Freq: Three times a day (TID) | ORAL | 0 refills | Status: DC
  Filled 2023-02-01: qty 100, 34d supply, fill #0

## 2023-02-01 NOTE — Telephone Encounter (Signed)
Scripts sent to pharmacy as requested

## 2023-02-04 ENCOUNTER — Other Ambulatory Visit: Payer: Self-pay

## 2023-02-04 ENCOUNTER — Telehealth: Payer: Self-pay | Admitting: Cardiovascular Disease

## 2023-02-04 NOTE — Telephone Encounter (Signed)
Pt calling back to speak with you about this

## 2023-02-04 NOTE — Telephone Encounter (Signed)
Patient is requesting to speak with Dr. Eden Emms or nurse

## 2023-02-04 NOTE — Telephone Encounter (Signed)
Spoke with patient, he states he does not know why Jason Day was prescribed to him and is not comfortable taking it if it was not prescribed by Jason Day.  Explained to patient this is a medication to help treat heart failure, prescribed by Jason Day.  Patient difficult to follow, he is concerned about cost of medication and unsure if he should take it.   Previous phone note between him and Jason Day were discussing this.  Patient states have Jason Day call him and if he does not answer wait 5 minutes and call again.

## 2023-02-05 ENCOUNTER — Other Ambulatory Visit: Payer: Self-pay

## 2023-02-05 NOTE — Telephone Encounter (Signed)
I spoke to patient and cleared up the situation.

## 2023-02-06 ENCOUNTER — Other Ambulatory Visit: Payer: Self-pay

## 2023-02-06 ENCOUNTER — Other Ambulatory Visit (INDEPENDENT_AMBULATORY_CARE_PROVIDER_SITE_OTHER): Payer: PPO

## 2023-02-06 ENCOUNTER — Other Ambulatory Visit (HOSPITAL_COMMUNITY): Payer: Self-pay

## 2023-02-06 ENCOUNTER — Ambulatory Visit: Payer: PPO | Attending: Cardiovascular Disease | Admitting: *Deleted

## 2023-02-06 ENCOUNTER — Telehealth: Payer: Self-pay | Admitting: Gastroenterology

## 2023-02-06 DIAGNOSIS — Z951 Presence of aortocoronary bypass graft: Secondary | ICD-10-CM | POA: Diagnosis not present

## 2023-02-06 DIAGNOSIS — K580 Irritable bowel syndrome with diarrhea: Secondary | ICD-10-CM

## 2023-02-06 DIAGNOSIS — Z5181 Encounter for therapeutic drug level monitoring: Secondary | ICD-10-CM | POA: Diagnosis not present

## 2023-02-06 LAB — POCT INR: INR: 4.1 — AB (ref 2.0–3.0)

## 2023-02-06 LAB — CBC WITH DIFFERENTIAL/PLATELET
Basophils Absolute: 0 10*3/uL (ref 0.0–0.1)
Basophils Relative: 0.4 % (ref 0.0–3.0)
Eosinophils Absolute: 0.1 10*3/uL (ref 0.0–0.7)
Eosinophils Relative: 1.3 % (ref 0.0–5.0)
HCT: 41.1 % (ref 39.0–52.0)
Hemoglobin: 13.9 g/dL (ref 13.0–17.0)
Lymphocytes Relative: 23.3 % (ref 12.0–46.0)
Lymphs Abs: 1.2 10*3/uL (ref 0.7–4.0)
MCHC: 33.9 g/dL (ref 30.0–36.0)
MCV: 93.6 fL (ref 78.0–100.0)
Monocytes Absolute: 0.4 10*3/uL (ref 0.1–1.0)
Monocytes Relative: 7.5 % (ref 3.0–12.0)
Neutro Abs: 3.3 10*3/uL (ref 1.4–7.7)
Neutrophils Relative %: 67.5 % (ref 43.0–77.0)
Platelets: 98 10*3/uL — ABNORMAL LOW (ref 150.0–400.0)
RBC: 4.39 Mil/uL (ref 4.22–5.81)
RDW: 14.1 % (ref 11.5–15.5)
WBC: 4.9 10*3/uL (ref 4.0–10.5)

## 2023-02-06 LAB — BASIC METABOLIC PANEL
BUN: 23 mg/dL (ref 6–23)
CO2: 30 meq/L (ref 19–32)
Calcium: 9.2 mg/dL (ref 8.4–10.5)
Chloride: 105 meq/L (ref 96–112)
Creatinine, Ser: 1.53 mg/dL — ABNORMAL HIGH (ref 0.40–1.50)
GFR: 42.19 mL/min — ABNORMAL LOW (ref >60.00)
Glucose, Bld: 99 mg/dL (ref 70–99)
Potassium: 3.9 meq/L (ref 3.5–5.1)
Sodium: 142 meq/L (ref 135–145)

## 2023-02-06 NOTE — Telephone Encounter (Signed)
Pt called and states his stool is "pitch black", reports is started yesterday morning. He states he is not on iron and has not taken any pepto. Pt states he had his INR checked and it was 4.1 and cardiology was very concerned. Tried to find out from pt what he had been instructed to do regarding his blood thinner and he states "they told me to contact you and I just stopped it." Pt also stated he was supposed to have new medication sent in from our office and that he had not gotten it. Discussed with pt that the meds were sent to his preferred pharmacy on 12/13 as he had been instructed. Pt very nervous on the phone and frustrated. Wants to know what he needs to do about the black stools. Please advise.

## 2023-02-06 NOTE — Telephone Encounter (Signed)
Inbound call from patient stating that his bowel movements have been " pitch black" and is requesting to speak with nurse. Please advise.

## 2023-02-06 NOTE — Patient Instructions (Addendum)
Description   Do not take any warfarin today then continue taking Warfarin 1 tablet daily.  Recheck INR in 4 weeks (normally 6 weeks per pt request (aware of risk)  Call coumadin clinic for any changes in medications or upcoming procedures.  Coumadin Clinic [phone]

## 2023-02-06 NOTE — Telephone Encounter (Signed)
Spoke with pt and he is aware of recommendations per Dr. Tomasa Rand, lab orders in epic.

## 2023-02-07 ENCOUNTER — Other Ambulatory Visit: Payer: Self-pay

## 2023-02-07 DIAGNOSIS — R7989 Other specified abnormal findings of blood chemistry: Secondary | ICD-10-CM

## 2023-02-07 NOTE — Progress Notes (Signed)
Mr. Carter,  Your hemoglobin (measure of blood in the body) was normal.  Your BUN was not significantly elevated, as would be expected in the setting of an upper GI bleed.  I feel very confident that the black stools are the result of the Kaopectate, and not a GI bleed. Your creatinine was a little high, which can sometimes indicate dehydration.  I would like to repeat this in a week. Please be sure to drink plenty of water between now and then.  In general, eight 8 oz glasses per day is a good starting point.  Bonita Quin,  Can you please set up a repeat BMP in 1 week?

## 2023-02-08 ENCOUNTER — Encounter: Payer: Self-pay | Admitting: Internal Medicine

## 2023-02-08 ENCOUNTER — Other Ambulatory Visit: Payer: Self-pay

## 2023-02-08 ENCOUNTER — Ambulatory Visit: Payer: PPO | Admitting: Internal Medicine

## 2023-02-08 VITALS — BP 102/50 | HR 58 | Temp 97.6°F | Resp 16 | Ht 70.0 in | Wt 159.5 lb

## 2023-02-08 DIAGNOSIS — J310 Chronic rhinitis: Secondary | ICD-10-CM

## 2023-02-08 DIAGNOSIS — I5042 Chronic combined systolic (congestive) and diastolic (congestive) heart failure: Secondary | ICD-10-CM | POA: Diagnosis not present

## 2023-02-08 DIAGNOSIS — K529 Noninfective gastroenteritis and colitis, unspecified: Secondary | ICD-10-CM

## 2023-02-08 MED ORDER — IPRATROPIUM BROMIDE 0.06 % NA SOLN
2.0000 | Freq: Four times a day (QID) | NASAL | 12 refills | Status: DC
  Filled 2023-02-08 – 2023-02-15 (×2): qty 15, 19d supply, fill #0

## 2023-02-08 NOTE — Patient Instructions (Addendum)
Chronic Rhinitis Chronic nasal congestion and rhinorrhea, exacerbated post myocardial infarction. Overuse of Afrin (Oxymetazoline) leading to rebound congestion. -Reduce Afrin to one spray per nostril at night to prevent rebound congestion. -Start Ipratropium bromide (Atrovent) nasal spray for rhinorrhea, one spray per nostril twice a day as needed, especially before meals. -Order allergy testing via blood work to rule out allergic rhinitis.  Heart Failure  History of myocardial infarction with residual 25% heart function. No current complaints of dyspnea or chest pain. -Continue current cardiac medications as prescribed by cardiologist. - Will hold off on skin testing for now, given cardiac conditions   Diarrhea  -Continue to follow with Dr. Tomasa Rand in GI    Follow up:  3 months   Thank you so much for letting me partake in your care today.  Don't hesitate to reach out if you have any additional concerns!  Ferol Luz, MD  [facility] [place], [place]

## 2023-02-08 NOTE — Progress Notes (Signed)
NEW PATIENT Date of Service/Encounter:  02/08/23 Referring provider: Carney Living, * Primary care provider: Carney Living, MD  Subjective:  Jason Day is a 82 y.o. male  presenting today for evaluation of chronic rhinitis  History obtained from: chart review and patient.   Discussed the use of AI scribe software for clinical note transcription with the patient, who gave verbal consent to proceed.  History of Present Illness   The patient, known with a history of heart disease, presents with chronic nasal congestion and a new onset of rhinorrhea. The nasal congestion has been a long-standing issue, managed with Afrin nasal spray, which the patient uses most nights, administering two sprays per nostril. The patient reports that Afrin is the only medication that provides relief, despite the understanding that overuse could lead to dependency.  The rhinorrhea, however, started within a year following a heart attack. The patient reports a significant increase in nasal secretions, often requiring the use of tissues throughout the day. The symptoms are particularly bothersome during meals. The patient denies trying any other medications or nasal sprays for this issue.  In addition to the nasal symptoms, the patient also reports a history of digestive issues, with frequent bowel movements leading to significant weight loss. The patient is currently under the care of a gastroenterologist for this issue. Despite these health challenges, the patient is able to maintain an active lifestyle, including yard work, but notes fatigue after about three hours of activity.          Other allergy screening: Asthma: no Rhino conjunctivitis: yes Food allergy: no Medication allergy: no Hymenoptera allergy: no Urticaria: no Eczema:no History of recurrent infections suggestive of immunodeficency: no Vaccinations are up to date.   Past Medical History: Past Medical History:  Diagnosis  Date   Acute combined systolic and diastolic congestive heart failure (HCC) 10/16/2016   Allergic rhinitis 09/01/2020   Anal fissure    Angio-edema    Bleeding hemorrhoids    Chronic combined systolic and diastolic heart failure (HCC)    Chronic sinus complaints    overuses afrin   Coronary atherosclerosis of native coronary artery    Dyspnea    Encounter for medical examination to establish care 04/22/2017   Gallstones    Hyperlipidemia    Irritable bowel syndrome 06/07/2017   Ischemic cardiomyopathy    Left atrial enlargement    Long term (current) use of anticoagulants 12/30/2020   Moderate mitral regurgitation    Myocardial infarction (HCC) 09/2015   Persistent atrial fibrillation (HCC)    RBBB    A- Fib   Shortness of breath 12/29/2020   Urinary incontinence 12/30/2020   Medication List:  Current Outpatient Medications  Medication Sig Dispense Refill   amitriptyline (ELAVIL) 10 MG tablet Take 1 tablet (10 mg total) by mouth at bedtime. 90 tablet 1   atorvastatin (LIPITOR) 10 MG tablet Take 1 tablet (10 mg total) by mouth daily. 90 tablet 1   colestipol (COLESTID) 1 g tablet TAKE 1TAB BY MOUTH 2TIMES DAILY*TAKE THIS MEDICATION AT LEAST 1HR AFTER TAKING YOUR OTHER MEDS. 60 tablet 1   dapagliflozin propanediol (FARXIGA) 10 MG TABS tablet Take 1 tablet (10 mg total) by mouth daily before breakfast. 90 tablet 3   hydrocortisone (ANUSOL-HC) 25 MG suppository Place 1 suppository (25 mg total) rectally at bedtime. 14 suppository 0   ipratropium (ATROVENT) 0.06 % nasal spray Place 2 sprays into both nostrils 4 (four) times daily. 15 mL 12   lipase/protease/amylase (CREON)  36000 UNITS CPEP capsule Take 1 capsule (36,000 Units total) by mouth 3 (three) times daily with meals. 100 capsule 0   metoprolol succinate (TOPROL-XL) 25 MG 24 hr tablet Take 0.5 tablets (12.5 mg total) by mouth daily. 45 tablet 1   sacubitril-valsartan (ENTRESTO) 49-51 MG Take 1 tablet by mouth 2 (two) times  daily. 180 tablet 1   spironolactone (ALDACTONE) 25 MG tablet Take 1 tablet (25 mg total) by mouth daily. 90 tablet 1   warfarin (COUMADIN) 5 MG tablet Take 1 tablet (5 mg total) by mouth daily, as directed by the coumadin clinic. 68 tablet 0   furosemide (LASIX) 40 MG tablet Take 40 mg by mouth as needed for edema.     oxymetazoline (AFRIN) 0.05 % nasal spray Place 1 spray into both nostrils at bedtime.     rifaximin (XIFAXAN) 550 MG TABS tablet Take 1 tablet (550 mg total) by mouth 3 (three) times daily. 41 tablet 0   No current facility-administered medications for this visit.   Known Allergies:  No Known Allergies Past Surgical History: Past Surgical History:  Procedure Laterality Date   BACK SURGERY  ~2014   disc repair   CARDIOVERSION N/A 10/24/2018   Procedure: CARDIOVERSION;  Surgeon: Jake Bathe, MD;  Location: [facility] ENDOSCOPY;  Service: Cardiovascular;  Laterality: N/A;   CHOLECYSTECTOMY     COLONOSCOPY  04/06/2014   Hemorrhoids and diverticulosis performed in [place]   COLONOSCOPY WITH PROPOFOL N/A 08/04/2019   Procedure: COLONOSCOPY WITH PROPOFOL;  Surgeon: Graylin Shiver, MD;  Location: [facility] ENDOSCOPY;  Service: Endoscopy;  Laterality: N/A;   CORONARY ARTERY BYPASS GRAFT N/A 10/18/2016   Procedure: CORONARY ARTERY BYPASS GRAFTING (CABG) x five , using left internal mammary artery and right leg greater saphenous vein harvested endoscopically;  Surgeon: Alleen Borne, MD;  Location: [facility] OR;  Service: Open Heart Surgery;  Laterality: N/A;   ESOPHAGOGASTRODUODENOSCOPY (EGD) WITH PROPOFOL N/A 08/04/2019   Procedure: ESOPHAGOGASTRODUODENOSCOPY (EGD) WITH PROPOFOL;  Surgeon: Graylin Shiver, MD;  Location: [facility] ENDOSCOPY;  Service: Endoscopy;  Laterality: N/A;   FOOT SURGERY     HEMORRHOID BANDING     HEMORRHOID SURGERY     REVERSE SHOULDER ARTHROPLASTY Left 08/21/2018   Procedure: REVERSE SHOULDER ARTHROPLASTY;  Surgeon: Francena Hanly, MD;  Location: [facility] ORS;  Service: Orthopedics;   Laterality: Left;   RIGHT/LEFT HEART CATH AND CORONARY ANGIOGRAPHY N/A 10/16/2016   Procedure: RIGHT/LEFT HEART CATH AND CORONARY ANGIOGRAPHY;  Surgeon: Lyn Records, MD;  Location: [facility] INVASIVE CV LAB;  Service: Cardiovascular;  Laterality: N/A;   stents in liver     TEE WITHOUT CARDIOVERSION N/A 10/18/2016   Procedure: TRANSESOPHAGEAL ECHOCARDIOGRAM (TEE);  Surgeon: Alleen Borne, MD;  Location[facility] OR;  Service: Open Heart Surgery;  Laterality: N/A;   TRANSURETHRAL RESECTION OF PROSTATE N/A 11/19/2019   Procedure: CYSTOSCOPY TRANSURETHRAL RESECTION OF THE PROSTATE (TURP);  Surgeon: Bjorn Pippin, MD;  Location: [facility] ORS;  Service: Urology;  Laterality: N/A;   Family History: Family History  Problem Relation Age of Onset   COPD Father    Emphysema Father    Healthy Brother    Diabetes Neg Hx    Cancer Neg Hx    Stomach cancer Neg Hx    Colon cancer Neg Hx    Allergic rhinitis Neg Hx    Angioedema Neg Hx    Asthma Neg Hx    Eczema Neg Hx    Urticaria Neg Hx    Social History: Sincere lives  SFH, wood flooring bedding and carpets in bedroom, gas heating, central cooling, heat pump, cat and dog with access to bedroom, no dust mite precautions, no tobacco smoke,  no hepa filter in the home and [place] an [place] and [place].   ROS:  All other systems negative except as noted per HPI.  Objective:  Blood pressure (!) 102/50, pulse (!) 58, temperature 97.6 F (36.4 C), temperature source Temporal, resp. rate 16, height 5\' 10"  (1.778 m), weight 159 lb 8 oz (72.3 kg), SpO2 98%. Body mass index is 22.89 kg/m. Physical Exam:  General Appearance:  Alert, cooperative, no distress, appears stated age  Head:  Normocephalic, without obvious abnormality, atraumatic  Eyes:  Conjunctiva clear, EOM's intact  Ears EACs normal bilaterally  Nose: Nares normal,  erythematous nasal mucosa, hypertrophic turbinates, no visible anterior polyps, and septum midline  Throat: Lips, tongue  normal; teeth and gums normal, + cobblestoning  Neck: Supple, symmetrical  Lungs:   clear to auscultation bilaterally, Respirations unlabored, no coughing  Heart:  regular rate and rhythm and no murmur, Appears well perfused  Extremities: No edema  Skin: Skin color, texture, turgor normal and no rashes or lesions on visualized portions of skin  Neurologic: No gross deficits   Diagnostics:   Labs:  Lab Orders         Allergens, Zone 2       Assessment and Plan  Assessment and Plan    Chronic Rhinitis Chronic nasal congestion and rhinorrhea, exacerbated post myocardial infarction. Overuse of Afrin (Oxymetazoline) leading to rebound congestion. -Reduce Afrin to one spray per nostril at night to prevent rebound congestion. -Start Ipratropium bromide (Atrovent) nasal spray for rhinorrhea, one spray per nostril twice a day as needed, especially before meals. -Order allergy testing via blood work to rule out allergic rhinitis.  Heart Failure  History of myocardial infarction with residual 25% heart function. No current complaints of dyspnea or chest pain. -Continue current cardiac medications as prescribed by cardiologist. - Will hold off on skin testing for now, given cardiac conditions   Diarrhea  -Continue to follow with Dr. Tomasa Rand in GI       This note in its entirety was forwarded to the Provider who requested this consultation.  Other:  none   Thank you for your kind referral. I appreciate the opportunity to take part in Linley's care. Please do not hesitate to contact me with questions.  Sincerely,  Thank you so much for letting me partake in your care today.  Don't hesitate to reach out if you have any additional concerns!  Ferol Luz, MD  [facility]- , [place]

## 2023-02-11 ENCOUNTER — Encounter: Payer: Self-pay | Admitting: Pharmacist

## 2023-02-11 ENCOUNTER — Other Ambulatory Visit: Payer: Self-pay

## 2023-02-12 LAB — ALLERGENS, ZONE 2

## 2023-02-14 ENCOUNTER — Other Ambulatory Visit (INDEPENDENT_AMBULATORY_CARE_PROVIDER_SITE_OTHER): Payer: PPO

## 2023-02-14 ENCOUNTER — Other Ambulatory Visit: Payer: Self-pay

## 2023-02-14 DIAGNOSIS — R7989 Other specified abnormal findings of blood chemistry: Secondary | ICD-10-CM | POA: Diagnosis not present

## 2023-02-14 LAB — BASIC METABOLIC PANEL
BUN: 30 mg/dL — ABNORMAL HIGH (ref 6–23)
CO2: 28 meq/L (ref 19–32)
Calcium: 9.3 mg/dL (ref 8.4–10.5)
Chloride: 103 meq/L (ref 96–112)
Creatinine, Ser: 1.45 mg/dL (ref 0.40–1.50)
GFR: 44.99 mL/min — ABNORMAL LOW (ref >60.00)
Glucose, Bld: 117 mg/dL — ABNORMAL HIGH (ref 70–99)
Potassium: 4.2 meq/L (ref 3.5–5.1)
Sodium: 140 meq/L (ref 135–145)

## 2023-02-15 ENCOUNTER — Other Ambulatory Visit (HOSPITAL_COMMUNITY): Payer: Self-pay

## 2023-02-15 ENCOUNTER — Other Ambulatory Visit: Payer: Self-pay

## 2023-02-16 ENCOUNTER — Encounter: Payer: Self-pay | Admitting: Gastroenterology

## 2023-02-16 NOTE — Progress Notes (Signed)
Mr. Biskup,  Your renal function is stable compared to a week ago.  Your BUN has gone up a little, which can indicate dehydration.  How has your diarrhea been the past week?  Are you still taking the Kaopectate?

## 2023-02-19 ENCOUNTER — Telehealth: Payer: Self-pay | Admitting: Gastroenterology

## 2023-02-19 NOTE — Telephone Encounter (Signed)
Returned call to pt and let him know per Dr. Milas Hock last message he can increase the elavil in a month if he is tolerating the medication at the lower dose. Left message for him to call back if he had additional questions.

## 2023-02-19 NOTE — Telephone Encounter (Signed)
 Patient called and stated that he would like to leave a message to Dr. Stacia nurse. Patient also stated that he wants to clarify that he does not have diarrhea and that his stool's are just loose to the point he can't control his bowel movement. As well patient stated that the medication Elavil  was indeed working and would like to know when exactly he can up his dosage. Patient is requesting a call back. Please advise.

## 2023-02-25 ENCOUNTER — Other Ambulatory Visit (HOSPITAL_COMMUNITY): Payer: Self-pay

## 2023-02-25 ENCOUNTER — Telehealth: Payer: Self-pay | Admitting: Cardiovascular Disease

## 2023-02-25 NOTE — Telephone Encounter (Signed)
 Pt c/o medication issue:  1. Name of Medication: dapagliflozin  propanediol (FARXIGA ) 10 MG TABS tablet   2. How are you currently taking this medication (dosage and times per day)?   3. Are you having a reaction (difficulty breathing--STAT)?   4. What is your medication issue? Patient is requesting call back to discuss this medication. States that he is in donut hole and cannot afford this medication currently. Would like to discuss this further.

## 2023-02-25 NOTE — Telephone Encounter (Signed)
 Called patient back about his message. Informed patient that it looks like he has a grant until July for his Entresto , Farxiga , toprol , and aldactone  (see phone encounter on 09/24/22). Patient is confused on how everything is suppose to work and when he will need to reapply. Will forward to pharmacy for help and see if they can call patient and explain.

## 2023-02-28 ENCOUNTER — Ambulatory Visit (INDEPENDENT_AMBULATORY_CARE_PROVIDER_SITE_OTHER): Payer: PPO | Admitting: Medical

## 2023-02-28 VITALS — BP 122/70 | HR 57 | Wt 155.0 lb

## 2023-02-28 DIAGNOSIS — K580 Irritable bowel syndrome with diarrhea: Secondary | ICD-10-CM | POA: Diagnosis not present

## 2023-02-28 DIAGNOSIS — R159 Full incontinence of feces: Secondary | ICD-10-CM

## 2023-02-28 NOTE — Progress Notes (Signed)
 Subjective:  Jason Day is a 83 y.o. male who presents for Chief Complaint  Patient presents with   get established    Get established, check ears, nose drips all the time- after eating and waking up in morning (sees allergist and dermatology), having issues with having a regular bowel movement at once. Takes him multiple times to finish a bowel movement. Feels like his rectum doesn't close up from having a bowel movement. Size of quarters or half dollar. Sees GI but nothing has helped     Patient Care Team: Jeanelle Layman CROME, MD as PCP - General (Family Medicine) Cindie Ole DASEN, MD as PCP - Electrophysiology (Cardiology) Delford Maude BROCKS, MD as Consulting Physician (Cardiology) Melita Drivers, MD as Consulting Physician (Orthopedic Surgery) Watt Norleen, MD as Attending Physician (Urology) Rolan Ezra RAMAN, MD as Consulting Physician (Cardiology) Stacia Glendia BRAVO, MD as Consulting Physician (Gastroenterology) Lorin Norris, MD as Consulting Physician (Allergy) [facility]   Concerns: Here with wife today to establish care  Sees several specialists.  Has heart problems.  His main concern today is his ongoing gastric problems.  For 7 years has had gastro problems.   Lately been seeing Dr. Stacia GI but was seeing Dr. Burnette prior.  He states when he eats, food goes in and within moments has to have BM.   Can't seem to control it.   Has tried diet changes, various medications, has had endoscopy and other studies.  Has seen proctologist before as well.  Has bits of stool coming out for hours after eating every meals.  Doesn't seem to have control over bowels.  For example every time he eats he has multiple bowel movements in the next several hours every single time he eats.  Does not like he has complete emptying and has urge to go multiple times after eating 4 hours  Has tried several medicaiton to get a better handle on this.  Within 20-30 minutes after eating has  to go stool.   Has an ungodly amount of gas.    Stool is soft, not loose, but frequent trips to stool, little bits comes out.  He often can get leakage when there is a lot of gas.  Or if he is doing some work in the yard if he bends over stool tends to come out and he cannot seem to control it.  He is wearing adult diapers.  Current diet: He does not eat a lot of fruit at all.  His wife says he mainly eats white.  He eats a lot of white bread.  He eats fair amount of cheese, onions, chicken.  He has tried the following IBS diet that was given to him prior but they basely feel like that eliminates pretty much any food that he would want to eat  No other aggravating or relieving factors.    No other c/o.  Past Medical History:  Diagnosis Date   Acute combined systolic and diastolic congestive heart failure (HCC) 10/16/2016   Allergic rhinitis 09/01/2020   Anal fissure    Angio-edema    Bleeding hemorrhoids    Chronic combined systolic and diastolic heart failure (HCC)    Chronic sinus complaints    overuses afrin   Coronary atherosclerosis of native coronary artery    Dyspnea    Encounter for medical examination to establish care 04/22/2017   Gallstones    Hyperlipidemia    Irritable bowel syndrome 06/07/2017   Ischemic cardiomyopathy    Left atrial enlargement  Long term (current) use of anticoagulants 12/30/2020   Moderate mitral regurgitation    Myocardial infarction [facility]) 09/2015   Persistent atrial fibrillation (HCC)    RBBB    A- Fib   Shortness of breath 12/29/2020   Urinary incontinence 12/30/2020   Current Outpatient Medications on File Prior to Visit  Medication Sig Dispense Refill   amitriptyline  (ELAVIL ) 10 MG tablet Take 1 tablet (10 mg total) by mouth at bedtime. 90 tablet 1   atorvastatin  (LIPITOR) 10 MG tablet Take 1 tablet (10 mg total) by mouth daily. 90 tablet 1   colestipol  (COLESTID ) 1 g tablet TAKE 1TAB BY MOUTH 2TIMES DAILY*TAKE THIS MEDICATION  AT LEAST 1HR AFTER TAKING YOUR OTHER MEDS. 60 tablet 1   dapagliflozin  propanediol (FARXIGA ) 10 MG TABS tablet Take 1 tablet (10 mg total) by mouth daily before breakfast. 90 tablet 3   ipratropium (ATROVENT ) 0.06 % nasal spray Place 2 sprays into both nostrils 4 (four) times daily. 15 mL 12   metoprolol  succinate (TOPROL -XL) 25 MG 24 hr tablet Take 0.5 tablets (12.5 mg total) by mouth daily. Need yearly appt. 45 tablet 1   sacubitril -valsartan  (ENTRESTO ) 49-51 MG Take 1 tablet by mouth 2 (two) times daily. 180 tablet 1   spironolactone  (ALDACTONE ) 25 MG tablet Take 1 tablet (25 mg total) by mouth daily. 90 tablet 1   warfarin (COUMADIN ) 5 MG tablet Take 1 tablet (5 mg total) by mouth daily, as directed by the coumadin  clinic. 68 tablet 0   No current facility-administered medications on file prior to visit.     The following portions of the patient's history were reviewed and updated as appropriate: allergies, current medications, past family history, past medical history, past social history, past surgical history and problem list.  ROS Otherwise as in subjective above    Objective: BP 122/70   Pulse (!) 57   Wt 155 lb (70.3 kg)   BMI 22.24 kg/m   BP Readings from Last 3 Encounters:  02/28/23 122/70  02/08/23 (!) 102/50  01/30/23 102/60   Wt Readings from Last 3 Encounters:  02/28/23 155 lb (70.3 kg)  02/08/23 159 lb 8 oz (72.3 kg)  01/30/23 155 lb 3.2 oz (70.4 kg)    General appearance: alert, no distress, well developed, well nourished    Assessment: Encounter Diagnoses  Name Primary?   Irritable bowel syndrome with diarrhea Yes   Incontinence of feces, unspecified fecal incontinence type      Plan: We discussed his concerns and problems.  I reviewed over chart records and epic including Dr. London last office note  He has tried several medicines before indicating Robinul  which gave him urinary retention, he did seem to get some benefit with Xifaxan  but  it was very expensive and he could not continue doing this regularly.  He is currently on colestipol  and amitriptyline  to help with his symptoms.  So far nothing is really been a cure or significantly improved his symptoms  We discussed trying some other approaches  I will reach out to his allergist.  He has not had food allergy testing will ask them to get him in for this  We discussed seeing a nutritionist to carefully reviewed food choices and avoid foods that tend to make IBS or gas worse  Consider referral to integrative therapies for biofeedback and pelvic floor physical therapy consults  I advise he keep a food diary of foods that he knows does make things worse and also record things at he  can take without problem    Jason Day was seen today for get established.  Diagnoses and all orders for this visit:  Irritable bowel syndrome with diarrhea -     Amb ref to Medical Nutrition Therapy-[facility]  Incontinence of feces, unspecified fecal incontinence type -     Amb ref to Medical Nutrition Therapy-[facility]    Follow up: Pending referrals

## 2023-02-28 NOTE — Patient Instructions (Signed)
 Make a diary of foods, such as 2 columns, 1 column of foods you DO tolerate, and another column of foods you absolutely don't tolerate  Take this with you to dietician and allergist.  Avoid foods that don't go well

## 2023-03-04 ENCOUNTER — Telehealth: Payer: Self-pay | Admitting: Medical

## 2023-03-04 DIAGNOSIS — Z0182 Encounter for allergy testing: Secondary | ICD-10-CM

## 2023-03-04 DIAGNOSIS — J310 Chronic rhinitis: Secondary | ICD-10-CM

## 2023-03-04 NOTE — Telephone Encounter (Signed)
 Pt himself called back and wants to go to another allergist  doctor closer to him  He is mad because current allergist office will not just schedule allergy test and are making him see dr first. He wants you to make them do allergy testing at his appt on 23rd?  Then he says he wants to see different allergist that is closer and will do allergy testing same day Advised him it may take some time to get in with new doctor and they may not do allergy testing at Initial new patient visit.   He wants you to make allergist do allergy test at his next visit with them

## 2023-03-04 NOTE — Telephone Encounter (Signed)
 I have spoke with patient and all he wants is Allergy testing done at his visit on first visit. I have put In a new referral incase he can get in with a new allergist for testing before his other allergist appt

## 2023-03-04 NOTE — Telephone Encounter (Signed)
 Wife called and states pt needs referral to allergy doctor for allergy testing  They called allergist to schedule allergy testing but was told they needed referral from pcp

## 2023-03-06 ENCOUNTER — Telehealth: Payer: Self-pay | Admitting: Gastroenterology

## 2023-03-06 ENCOUNTER — Telehealth: Payer: Self-pay | Admitting: Cardiovascular Disease

## 2023-03-06 ENCOUNTER — Encounter: Payer: Self-pay | Admitting: Cardiovascular Disease

## 2023-03-06 NOTE — Telephone Encounter (Signed)
 Pt c/o medication issue:  1. Name of Medication:   dapagliflozin  propanediol (FARXIGA ) 10 MG TABS tablet    2. How are you currently taking this medication (dosage and times per day)? Take 1 tablet (10 mg total) by mouth daily before breakfast.   3. Are you having a reaction (difficulty breathing--STAT)? No  4. What is your medication issue? Pt wants a cb to discuss the purpose of him taking this expensive medication. No one discussed it with him. This was prescribed by Dr Mitzie Anda and pt has not gotten an explanation from them. Wants Dr Stann Earnest opinion on this

## 2023-03-06 NOTE — Telephone Encounter (Signed)
 Patient called stated Elavil  given isn't helping him at all, the patient is requesting to speak with Dr. Cherryl Corona to seek further advise

## 2023-03-06 NOTE — Telephone Encounter (Signed)
 Inbound call from patients wife stating that patient is having uncontrollable bowel movements and is bleeding. Wife states she had to call the plummer to get the toilet plunged due to all the blood he has lost. Spoke with nurse Loran Rock and she advised that patient go to the ER. Wife was advised. Please advise.

## 2023-03-06 NOTE — Telephone Encounter (Signed)
 LVM regarding Farxiga  questions.  Per phone call note, pt would like Dr. Francie Irani advice on this medication.  Will forward to provider and nurse.

## 2023-03-06 NOTE — Telephone Encounter (Signed)
 No additional recommendations needed at this time, pts wife was instructed to take pt to the ER.

## 2023-03-07 ENCOUNTER — Ambulatory Visit: Payer: PPO | Attending: Internal Medicine | Admitting: *Deleted

## 2023-03-07 DIAGNOSIS — Z951 Presence of aortocoronary bypass graft: Secondary | ICD-10-CM | POA: Diagnosis not present

## 2023-03-07 DIAGNOSIS — Z5181 Encounter for therapeutic drug level monitoring: Secondary | ICD-10-CM

## 2023-03-07 DIAGNOSIS — I482 Chronic atrial fibrillation, unspecified: Secondary | ICD-10-CM | POA: Diagnosis not present

## 2023-03-07 LAB — POCT INR: INR: 1.5 — AB (ref 2.0–3.0)

## 2023-03-07 NOTE — Patient Instructions (Signed)
Description   Take 2 tablets of warfarin today and then START taking Warfarin 1 tablet daily except 1.5 tablets of Mondays.  Recheck INR in 4 weeks per pt request (normally 6 weeks per pt request (aware of risk)  Call coumadin clinic for any changes in medications or upcoming procedures.  Coumadin Clinic [phone]

## 2023-03-08 ENCOUNTER — Other Ambulatory Visit (HOSPITAL_COMMUNITY): Payer: Self-pay

## 2023-03-08 NOTE — Telephone Encounter (Signed)
Pt aware  Dawayne Patricia is needed  Message to pharmacy team to assist

## 2023-03-11 NOTE — Telephone Encounter (Signed)
Called patient to discuss grant approval / process / renewal. No answer, left voicemail.

## 2023-03-15 ENCOUNTER — Ambulatory Visit: Payer: PPO | Admitting: Internal Medicine

## 2023-03-15 ENCOUNTER — Encounter: Payer: Self-pay | Admitting: Internal Medicine

## 2023-03-15 ENCOUNTER — Other Ambulatory Visit: Payer: Self-pay

## 2023-03-15 VITALS — BP 102/52 | HR 65 | Temp 97.9°F | Resp 16

## 2023-03-15 DIAGNOSIS — I5042 Chronic combined systolic (congestive) and diastolic (congestive) heart failure: Secondary | ICD-10-CM

## 2023-03-15 DIAGNOSIS — K9089 Other intestinal malabsorption: Secondary | ICD-10-CM

## 2023-03-15 DIAGNOSIS — J310 Chronic rhinitis: Secondary | ICD-10-CM

## 2023-03-15 NOTE — Patient Instructions (Addendum)
Chronic Rhinitis: nonallergic  Chronic nasal congestion and rhinorrhea, exacerbated post myocardial infarction. Overuse of Afrin (Oxymetazoline) leading to rebound congestion. -Continue Afrin to one spray per nostril at night to prevent rebound congestion. -Continue Ipratropium bromide (Atrovent) nasal spray for rhinorrhea, one spray per nostril twice a day as needed, especially before meals.    Diarrhea/ Irritable Bowel Syndrome (IBS) Chronic symptoms of urgency, gas, and diarrhea, particularly after eating. Significant dietary modifications have been made with no improvement. Weight loss noted. 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 No current complaints of dyspnea or chest pain. -Continue current cardiac medications as prescribed by cardiologist.   Follow up:  in 6 months, we will call you with GI recommendation   Thank you so much for letting me partake in your care today.  Don't hesitate to reach out if you have any additional concerns!  Ferol Luz, MD  [facility] [place], [place]

## 2023-03-15 NOTE — Progress Notes (Unsigned)
FOLLOW UP Date of Service/Encounter:  03/19/23  Subjective:  Jason Day (DOB: 05/03/1940) is a 83 y.o. male who [facility] in re-evaluation of the following: chronic rhinitis  History obtained from: chart review and patient and Wife  .  For Review, LV was on 12/202/24  with Dr. Marlynn Perking seen for intial visit for chronic rhinitis and diarrhea  . See below for summary of history and diagnostics.   Therapeutic plans/changes recommended: decrease afrin, start atrovent and blood work obtained which was negative to environmentals  ----------------------------------------------------- Pertinent History/Diagnostics:  Chronic Rhinitis:  - sIgE; negative (02/07/22)  - CT Head : clear sinuses   Other: chronic diarrhea (follows with Dr. Tomasa Rand in GI), heart failure (EF 25%)  --------------------------------------------------- Today presents for follow-up. Discussed the use of AI scribe software for clinical note transcription with the patient, who gave verbal consent to proceed.  History of Present Illness   The patient, with a history of bowel issues, presents with chronic and severe gastrointestinal symptoms. He reports a significant weight loss, from 159 to 142 pounds, due to persistent bowel issues that occur three days out of every week. The patient's diet has been restricted to chicken and water in an attempt to alleviate symptoms, but with no success.  The patient's symptoms include an urgent need to defecate, often with little warning, and an inability to control bowel movements. These episodes occur frequently, often immediately after eating and in the mornings. The patient also reports excessive gas and the passage of loose, slimy stools.  Despite extensive dietary modifications, including the elimination of tomatoes, white bread, beans, lettuce, broccoli, string beans, and various types of milk, the patient's symptoms persist. The patient has  also undergone various medical tests and treatments, including a parasite treatment, with no significant improvement in symptoms.  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 The patient's quality of life is significantly impacted by his bowel issues, with three to four days of the week spent primarily in the bathroom.  The patient's symptoms have been ongoing for approximately seven years, with no clear diagnosis or effective treatment to date. The patient is seeking further diagnostic testing and treatment options to improve his quality of life.  They would like allergy testing to foods today.       Chronic nasal drainage and congestion has improved with reducing Afrin and starting nasal Atrovent.  No side effects of medications.   All medications reviewed by clinical staff and updated in chart. No new pertinent medical or surgical history except as noted in HPI.  ROS: All others negative except as noted per HPI.   Objective:  BP (!) 102/52 (BP Location: Left Arm, Patient Position: Sitting, Cuff Size: Normal)   Pulse 65   Temp 97.9 F (36.6 C) (Temporal)   Resp 16   SpO2 94%  There is no height or weight on file to calculate BMI. Physical Exam: General Appearance:  Alert, cooperative, no distress, appears stated age  Head:  Normocephalic, without obvious abnormality, atraumatic  Eyes:  Conjunctiva clear, EOM's intact  Ears EACs normal bilaterally  Nose: Nares normal,     Throat: Lips, tongue normal; teeth and gums normal,     Neck: Supple, symmetrical  Lungs:     , Respirations unlabored,     Heart:    , Appears well perfused  Extremities: No edema  Skin: Skin color, texture, turgor normal and no rashes or lesions on visualized portions of skin  Neurologic: No gross deficits   Labs:  Lab Orders  No laboratory test(s) ordered today     Skin Testing: Food allergy  panel. Adequate positive and negative controls. Results discussed with patient/family.    Allergy testing results were read and interpreted by myself, documented by clinical staff.  Assessment/Plan  Chronic Rhinitis: nonallergic  Chronic nasal congestion and rhinorrhea, exacerbated post myocardial infarction. Overuse of Afrin (Oxymetazoline) leading to rebound congestion. -Continue Afrin to one spray per nostril at night to prevent rebound congestion. -Continue Ipratropium bromide (Atrovent) nasal spray for rhinorrhea, one spray per nostril twice a day as needed, especially before meals.    Diarrhea/ Irritable Bowel Syndrome (IBS) Chronic symptoms of urgency, gas, and diarrhea, particularly after eating. Significant dietary modifications have been made with no improvement. Weight loss noted. 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 No current complaints of dyspnea or chest pain. -Continue current cardiac medications as prescribed by cardiologist.   Follow up:  in 6 months, we will call you with GI recommendation   Other:     Thank you so much for letting me partake in your care today.  Don't hesitate to reach out if you have any additional concerns!  Ferol Luz, MD  [facility] [place], [place]

## 2023-03-18 ENCOUNTER — Telehealth: Payer: Self-pay | Admitting: Gastroenterology

## 2023-03-18 DIAGNOSIS — K580 Irritable bowel syndrome with diarrhea: Secondary | ICD-10-CM

## 2023-03-18 DIAGNOSIS — R140 Abdominal distension (gaseous): Secondary | ICD-10-CM

## 2023-03-18 DIAGNOSIS — R143 Flatulence: Secondary | ICD-10-CM

## 2023-03-18 DIAGNOSIS — R159 Full incontinence of feces: Secondary | ICD-10-CM

## 2023-03-18 NOTE — Telephone Encounter (Signed)
Called and spoke with patient and his wife. Pt's wife is very concerned with patient's IBS symptoms, pt reports that his "bowel system is all messed up". Pt's wife reports that patient's symptoms improved while on Rifaximin but did not completely resolve. Amitriptyline has not seemed to help, pt's wife states that she gave pt one of her Buspirone 5 mg and it worked very well for patient, he was able to get a good nights rest. Pt's wife wanted to know if she should continue giving pt Buspirone and I advised against this as we do not know if it will cause any interactions with his other prescribed medications. Pt's wife also gave pt some Gas-X that did help with his significant flatulence. Pt's wife reports that the patient lives in the bathroom, not with diarrhea. Typically 5-6, solid stools a day, sometimes slimy at the end of BM. Pt is not taking Colestipol. Pt's wife states that she thinks there is something wrong with the patient's bowels, she mentioned perforated bowel. I advised that patient would be in significant pain and showing signs of possible infection if he had a perforated bowel. Pt is not showing any signs of distress. Pt's wife stated that patient has lost weight (now 140 lbs), he is not eating as much. Whenever he eats he has an urgency to have a BM. she mentioned that he has not had any recent imaging and would like that if its recommended or change in medication regimen. Both patient and his wife are aware that Dr. Tomasa Rand is out of the office today and will advise upon his return.

## 2023-03-18 NOTE — Telephone Encounter (Signed)
PT is calling to discuss his IBS symptoms worsening, Please advise. If he doesn't answer he is requesting [place] to call wife, Salathiel Ferrara [phone]

## 2023-03-19 ENCOUNTER — Other Ambulatory Visit: Payer: Self-pay | Admitting: Cardiovascular Disease

## 2023-03-19 ENCOUNTER — Other Ambulatory Visit: Payer: Self-pay

## 2023-03-19 ENCOUNTER — Other Ambulatory Visit (HOSPITAL_COMMUNITY): Payer: Self-pay

## 2023-03-19 DIAGNOSIS — I4821 Permanent atrial fibrillation: Secondary | ICD-10-CM

## 2023-03-19 MED ORDER — WARFARIN SODIUM 5 MG PO TABS
5.0000 mg | ORAL_TABLET | Freq: Every day | ORAL | 0 refills | Status: DC
  Filled 2023-03-19: qty 100, 90d supply, fill #0

## 2023-03-19 NOTE — Telephone Encounter (Signed)
Prescription refill request received for warfarin Lov: 01/30/23 Jason Day)  Next INR check: 04/04/23 Warfarin tablet strength: 5mg   Appropriate dose. Refill sent.

## 2023-03-20 NOTE — Telephone Encounter (Signed)
CT ordered and sent to the schedulers Labs ordered  The pt advised via Dr Tomasa Rand

## 2023-03-21 ENCOUNTER — Telehealth: Payer: Self-pay | Admitting: Cardiovascular Disease

## 2023-03-21 NOTE — Telephone Encounter (Signed)
Patient is requesting call back. He did not want to give further details. Just that it is about his heart.

## 2023-03-21 NOTE — Telephone Encounter (Signed)
Called patient back with Dr. Eden Emms message. "Would observe for now". Patient agreed to plan.

## 2023-03-21 NOTE — Telephone Encounter (Signed)
Called patient back about message. Patient stated for about a week now he has been getting SOB with minimal activity. Patient stated he will stop and try to catch his breath and then gets dizzy. Patient stated he feels somewhat better today. Patient stated he just wants to know what he can do for this when it happens. Patient continues to take his Sherryll Burger and has no BLE edema. Will forward to Dr. Eden Emms for advisement.

## 2023-03-25 NOTE — Telephone Encounter (Signed)
Patient called stated he needs to further discuss having the CT scan. Is also wondering if he can get a vallum to help with being claustrophobic.

## 2023-03-25 NOTE — Telephone Encounter (Signed)
Pt called and wanted to know for the CT scan if he would be enclosed in a tube. Discussed with him it is like a donut and open at the top and bottom. He thinks he will be fine with this and not need a sedative. Pt wants to know if there is a medication that he could take that will get his bowels moving in conjunction with his brain. He read about a pill in a magazine and wants to know if this is something that would be an option for him. Please advise.

## 2023-03-25 NOTE — Telephone Encounter (Signed)
Pt scheduled to see Dr. Tomasa Rand 04/03/23 at 10:30am. Pt aware of appt.

## 2023-03-26 ENCOUNTER — Telehealth: Payer: Self-pay | Admitting: Cardiovascular Disease

## 2023-03-26 NOTE — Telephone Encounter (Signed)
 Pt c/o Shortness Of Breath: STAT if SOB developed within the last 24 hours or pt is noticeably SOB on the phone  1. Are you currently SOB (can you hear that pt is SOB on the phone)? Yes  2. How long have you been experiencing SOB? About 3 weeks   3. Are you SOB when sitting or when up moving around? Moving   4. Are you currently experiencing any other symptoms? No, pt requesting a cb from [facility] regarding getting oxygen

## 2023-03-26 NOTE — Telephone Encounter (Signed)
Called patient to let him know that he needs to call his PCP. That he would need to see a provider who would test him to see if he needs oxygen. Patient verbalized understanding.

## 2023-03-29 ENCOUNTER — Other Ambulatory Visit: Payer: Self-pay

## 2023-03-29 ENCOUNTER — Emergency Department (HOSPITAL_COMMUNITY): Payer: PPO

## 2023-03-29 ENCOUNTER — Encounter (HOSPITAL_COMMUNITY): Payer: Self-pay | Admitting: Emergency Medicine

## 2023-03-29 ENCOUNTER — Inpatient Hospital Stay (HOSPITAL_COMMUNITY)
Admission: EM | Admit: 2023-03-29 | Discharge: 2023-04-03 | DRG: 242 | Disposition: A | Discharge status: Home / Self Care | Payer: PPO | Source: Ambulatory Visit | Attending: Internal Medicine | Admitting: Internal Medicine

## 2023-03-29 ENCOUNTER — Ambulatory Visit (INDEPENDENT_AMBULATORY_CARE_PROVIDER_SITE_OTHER): Payer: PPO | Admitting: Medical

## 2023-03-29 ENCOUNTER — Telehealth: Payer: Self-pay | Admitting: Cardiovascular Disease

## 2023-03-29 VITALS — BP 122/70 | HR 100 | Temp 96.8°F | Wt 158.0 lb

## 2023-03-29 DIAGNOSIS — I4819 Other persistent atrial fibrillation: Secondary | ICD-10-CM | POA: Diagnosis not present

## 2023-03-29 DIAGNOSIS — Z951 Presence of aortocoronary bypass graft: Secondary | ICD-10-CM

## 2023-03-29 DIAGNOSIS — I5020 Unspecified systolic (congestive) heart failure: Secondary | ICD-10-CM | POA: Diagnosis not present

## 2023-03-29 DIAGNOSIS — R42 Dizziness and giddiness: Secondary | ICD-10-CM | POA: Diagnosis not present

## 2023-03-29 DIAGNOSIS — Z7984 Long term (current) use of oral hypoglycemic drugs: Secondary | ICD-10-CM

## 2023-03-29 DIAGNOSIS — R0600 Dyspnea, unspecified: Secondary | ICD-10-CM | POA: Diagnosis not present

## 2023-03-29 DIAGNOSIS — R7989 Other specified abnormal findings of blood chemistry: Secondary | ICD-10-CM | POA: Diagnosis not present

## 2023-03-29 DIAGNOSIS — I5082 Biventricular heart failure: Secondary | ICD-10-CM | POA: Diagnosis present

## 2023-03-29 DIAGNOSIS — I495 Sick sinus syndrome: Secondary | ICD-10-CM | POA: Diagnosis present

## 2023-03-29 DIAGNOSIS — Z79899 Other long term (current) drug therapy: Secondary | ICD-10-CM

## 2023-03-29 DIAGNOSIS — R319 Hematuria, unspecified: Secondary | ICD-10-CM | POA: Diagnosis not present

## 2023-03-29 DIAGNOSIS — R410 Disorientation, unspecified: Secondary | ICD-10-CM | POA: Diagnosis not present

## 2023-03-29 DIAGNOSIS — T45515A Adverse effect of anticoagulants, initial encounter: Secondary | ICD-10-CM | POA: Diagnosis present

## 2023-03-29 DIAGNOSIS — R0602 Shortness of breath: Secondary | ICD-10-CM

## 2023-03-29 DIAGNOSIS — Z7901 Long term (current) use of anticoagulants: Secondary | ICD-10-CM | POA: Diagnosis not present

## 2023-03-29 DIAGNOSIS — I5023 Acute on chronic systolic (congestive) heart failure: Secondary | ICD-10-CM | POA: Diagnosis present

## 2023-03-29 DIAGNOSIS — F320 Major depressive disorder, single episode, mild: Secondary | ICD-10-CM | POA: Diagnosis not present

## 2023-03-29 DIAGNOSIS — Z825 Family history of asthma and other chronic lower respiratory diseases: Secondary | ICD-10-CM

## 2023-03-29 DIAGNOSIS — R918 Other nonspecific abnormal finding of lung field: Secondary | ICD-10-CM | POA: Diagnosis not present

## 2023-03-29 DIAGNOSIS — R531 Weakness: Secondary | ICD-10-CM

## 2023-03-29 DIAGNOSIS — Z9079 Acquired absence of other genital organ(s): Secondary | ICD-10-CM

## 2023-03-29 DIAGNOSIS — I4892 Unspecified atrial flutter: Secondary | ICD-10-CM | POA: Diagnosis not present

## 2023-03-29 DIAGNOSIS — I5022 Chronic systolic (congestive) heart failure: Secondary | ICD-10-CM | POA: Insufficient documentation

## 2023-03-29 DIAGNOSIS — I5021 Acute systolic (congestive) heart failure: Secondary | ICD-10-CM | POA: Diagnosis not present

## 2023-03-29 DIAGNOSIS — Z96612 Presence of left artificial shoulder joint: Secondary | ICD-10-CM | POA: Diagnosis present

## 2023-03-29 DIAGNOSIS — F40240 Claustrophobia: Secondary | ICD-10-CM | POA: Diagnosis present

## 2023-03-29 DIAGNOSIS — R791 Abnormal coagulation profile: Secondary | ICD-10-CM | POA: Diagnosis not present

## 2023-03-29 DIAGNOSIS — I340 Nonrheumatic mitral (valve) insufficiency: Secondary | ICD-10-CM | POA: Diagnosis present

## 2023-03-29 DIAGNOSIS — I959 Hypotension, unspecified: Secondary | ICD-10-CM | POA: Diagnosis not present

## 2023-03-29 DIAGNOSIS — N1831 Chronic kidney disease, stage 3a: Secondary | ICD-10-CM | POA: Diagnosis not present

## 2023-03-29 DIAGNOSIS — I252 Old myocardial infarction: Secondary | ICD-10-CM

## 2023-03-29 DIAGNOSIS — N1832 Chronic kidney disease, stage 3b: Secondary | ICD-10-CM | POA: Diagnosis not present

## 2023-03-29 DIAGNOSIS — D631 Anemia in chronic kidney disease: Secondary | ICD-10-CM | POA: Diagnosis present

## 2023-03-29 DIAGNOSIS — I2510 Atherosclerotic heart disease of native coronary artery without angina pectoris: Secondary | ICD-10-CM | POA: Diagnosis not present

## 2023-03-29 DIAGNOSIS — I442 Atrioventricular block, complete: Secondary | ICD-10-CM | POA: Diagnosis not present

## 2023-03-29 DIAGNOSIS — R0902 Hypoxemia: Secondary | ICD-10-CM | POA: Diagnosis not present

## 2023-03-29 DIAGNOSIS — R627 Adult failure to thrive: Secondary | ICD-10-CM | POA: Diagnosis not present

## 2023-03-29 DIAGNOSIS — I130 Hypertensive heart and chronic kidney disease with heart failure and stage 1 through stage 4 chronic kidney disease, or unspecified chronic kidney disease: Principal | ICD-10-CM | POA: Diagnosis present

## 2023-03-29 DIAGNOSIS — E785 Hyperlipidemia, unspecified: Secondary | ICD-10-CM | POA: Diagnosis present

## 2023-03-29 DIAGNOSIS — I4510 Unspecified right bundle-branch block: Secondary | ICD-10-CM | POA: Diagnosis not present

## 2023-03-29 DIAGNOSIS — K580 Irritable bowel syndrome with diarrhea: Secondary | ICD-10-CM | POA: Diagnosis present

## 2023-03-29 DIAGNOSIS — R001 Bradycardia, unspecified: Secondary | ICD-10-CM | POA: Diagnosis not present

## 2023-03-29 DIAGNOSIS — Z87891 Personal history of nicotine dependence: Secondary | ICD-10-CM | POA: Diagnosis not present

## 2023-03-29 DIAGNOSIS — E782 Mixed hyperlipidemia: Secondary | ICD-10-CM | POA: Diagnosis present

## 2023-03-29 DIAGNOSIS — I517 Cardiomegaly: Secondary | ICD-10-CM | POA: Diagnosis not present

## 2023-03-29 DIAGNOSIS — I255 Ischemic cardiomyopathy: Secondary | ICD-10-CM | POA: Diagnosis not present

## 2023-03-29 DIAGNOSIS — I2583 Coronary atherosclerosis due to lipid rich plaque: Secondary | ICD-10-CM | POA: Diagnosis not present

## 2023-03-29 DIAGNOSIS — D6832 Hemorrhagic disorder due to extrinsic circulating anticoagulants: Secondary | ICD-10-CM | POA: Diagnosis not present

## 2023-03-29 DIAGNOSIS — R159 Full incontinence of feces: Secondary | ICD-10-CM

## 2023-03-29 DIAGNOSIS — I4821 Permanent atrial fibrillation: Secondary | ICD-10-CM | POA: Diagnosis not present

## 2023-03-29 DIAGNOSIS — Z950 Presence of cardiac pacemaker: Secondary | ICD-10-CM | POA: Diagnosis not present

## 2023-03-29 DIAGNOSIS — Z9049 Acquired absence of other specified parts of digestive tract: Secondary | ICD-10-CM

## 2023-03-29 DIAGNOSIS — I25758 Atherosclerosis of native coronary artery of transplanted heart with other forms of angina pectoris: Secondary | ICD-10-CM | POA: Diagnosis present

## 2023-03-29 DIAGNOSIS — I4891 Unspecified atrial fibrillation: Secondary | ICD-10-CM | POA: Diagnosis not present

## 2023-03-29 LAB — I-STAT CHEM 8, ED
BUN: 28 mg/dL — ABNORMAL HIGH (ref 8–23)
Calcium, Ion: 1.13 mmol/L — ABNORMAL LOW (ref 1.15–1.40)
Chloride: 105 mmol/L (ref 98–111)
Creatinine, Ser: 1.7 mg/dL — ABNORMAL HIGH (ref 0.61–1.24)
Glucose, Bld: 95 mg/dL (ref 70–99)
HCT: 38 % — ABNORMAL LOW (ref 39.0–52.0)
Hemoglobin: 12.9 g/dL — ABNORMAL LOW (ref 13.0–17.0)
Potassium: 4.9 mmol/L (ref 3.5–5.1)
Sodium: 141 mmol/L (ref 135–145)
TCO2: 24 mmol/L (ref 22–32)

## 2023-03-29 LAB — COMPREHENSIVE METABOLIC PANEL
ALT: 20 U/L (ref 0–44)
AST: 20 U/L (ref 15–41)
Albumin: 3.7 g/dL (ref 3.5–5.0)
Alkaline Phosphatase: 88 U/L (ref 38–126)
Anion gap: 9 (ref 5–15)
BUN: 25 mg/dL — ABNORMAL HIGH (ref 8–23)
CO2: 25 mmol/L (ref 22–32)
Calcium: 9 mg/dL (ref 8.9–10.3)
Chloride: 106 mmol/L (ref 98–111)
Creatinine, Ser: 1.54 mg/dL — ABNORMAL HIGH (ref 0.61–1.24)
GFR, Estimated: 45 mL/min — ABNORMAL LOW (ref >60)
Glucose, Bld: 101 mg/dL — ABNORMAL HIGH (ref 70–99)
Potassium: 4.9 mmol/L (ref 3.5–5.1)
Sodium: 140 mmol/L (ref 135–145)
Total Bilirubin: 2.2 mg/dL — ABNORMAL HIGH (ref 0.0–1.2)
Total Protein: 6.5 g/dL (ref 6.5–8.1)

## 2023-03-29 LAB — CBC WITH DIFFERENTIAL/PLATELET
Abs Immature Granulocytes: 0.02 10*3/uL (ref 0.00–0.07)
Basophils Absolute: 0 10*3/uL (ref 0.0–0.1)
Basophils Relative: 0 %
Eosinophils Absolute: 0 10*3/uL (ref 0.0–0.5)
Eosinophils Relative: 1 %
HCT: 39 % (ref 39.0–52.0)
Hemoglobin: 12.9 g/dL — ABNORMAL LOW (ref 13.0–17.0)
Immature Granulocytes: 0 %
Lymphocytes Relative: 15 %
Lymphs Abs: 0.8 10*3/uL (ref 0.7–4.0)
MCH: 30.8 pg (ref 26.0–34.0)
MCHC: 33.1 g/dL (ref 30.0–36.0)
MCV: 93.1 fL (ref 80.0–100.0)
Monocytes Absolute: 0.5 10*3/uL (ref 0.1–1.0)
Monocytes Relative: 9 %
Neutro Abs: 3.9 10*3/uL (ref 1.7–7.7)
Neutrophils Relative %: 75 %
Platelets: 102 10*3/uL — ABNORMAL LOW (ref 150–400)
RBC: 4.19 MIL/uL — ABNORMAL LOW (ref 4.22–5.81)
RDW: 14.3 % (ref 11.5–15.5)
WBC: 5.2 10*3/uL (ref 4.0–10.5)
nRBC: 0 % (ref 0.0–0.2)

## 2023-03-29 LAB — URINALYSIS, ROUTINE W REFLEX MICROSCOPIC
Bacteria, UA: NONE SEEN
Bilirubin Urine: NEGATIVE
Glucose, UA: NEGATIVE mg/dL
Ketones, ur: NEGATIVE mg/dL
Leukocytes,Ua: NEGATIVE
Nitrite: NEGATIVE
Protein, ur: NEGATIVE mg/dL
Specific Gravity, Urine: 1.004 — ABNORMAL LOW (ref 1.005–1.030)
pH: 5 (ref 5.0–8.0)

## 2023-03-29 LAB — TROPONIN I (HIGH SENSITIVITY)
Troponin I (High Sensitivity): 94 ng/L — ABNORMAL HIGH (ref <18)
Troponin I (High Sensitivity): 110 ng/L (ref <18)

## 2023-03-29 LAB — MAGNESIUM: Magnesium: 2.2 mg/dL (ref 1.7–2.4)

## 2023-03-29 LAB — BRAIN NATRIURETIC PEPTIDE: B Natriuretic Peptide: 1287.3 pg/mL — ABNORMAL HIGH (ref 0.0–100.0)

## 2023-03-29 MED ORDER — FUROSEMIDE 10 MG/ML IJ SOLN: 40.0000 mg | Freq: Once | INTRAMUSCULAR | Status: DC

## 2023-03-29 MED ORDER — METOPROLOL SUCCINATE ER 25 MG PO TB24: 12.5000 mg | ORAL_TABLET | Freq: Every day | ORAL | Status: DC

## 2023-03-29 MED ORDER — COLESTIPOL HCL 1 G PO TABS
0.5000 g | ORAL_TABLET | Freq: Every day | ORAL | Status: DC
  Filled 2023-03-29 (×5): qty 1

## 2023-03-29 MED ORDER — FUROSEMIDE 10 MG/ML IJ SOLN
20.0000 mg | Freq: Once | INTRAMUSCULAR | Status: AC
  Administered 2023-03-29: 20 mg via INTRAVENOUS
  Filled 2023-03-29: qty 2

## 2023-03-29 MED ORDER — ACETAMINOPHEN 325 MG PO TABS: 650.0000 mg | ORAL_TABLET | Freq: Four times a day (QID) | ORAL | Status: DC | PRN

## 2023-03-29 MED ORDER — SACUBITRIL-VALSARTAN 49-51 MG PO TABS
1.0000 | ORAL_TABLET | Freq: Two times a day (BID) | ORAL | Status: DC
  Filled 2023-03-29: qty 1

## 2023-03-29 MED ORDER — ONDANSETRON HCL 4 MG/2ML IJ SOLN: 4.0000 mg | Freq: Four times a day (QID) | INTRAMUSCULAR | Status: DC | PRN

## 2023-03-29 MED ORDER — ATORVASTATIN CALCIUM 10 MG PO TABS
10.0000 mg | ORAL_TABLET | Freq: Every day | ORAL | Status: DC
  Filled 2023-03-29 (×4): qty 1

## 2023-03-29 MED ORDER — MELATONIN 3 MG PO TABS: 3.0000 mg | ORAL_TABLET | Freq: Every evening | ORAL | Status: DC | PRN

## 2023-03-29 MED ORDER — FUROSEMIDE 10 MG/ML IJ SOLN
40.0000 mg | Freq: Two times a day (BID) | INTRAMUSCULAR | Status: DC
  Filled 2023-03-29: qty 4

## 2023-03-29 MED ORDER — ACETAMINOPHEN 650 MG RE SUPP: 650.0000 mg | Freq: Four times a day (QID) | RECTAL | Status: DC | PRN

## 2023-03-29 NOTE — Assessment & Plan Note (Addendum)
 S/p CABG in 2018  No chest pain or angina. High sensitive troponin elevation due to heart failure exacerbation.  Ruled out for acute coronary syndrome.

## 2023-03-29 NOTE — ED Notes (Addendum)
 Patient ambulatory to restroom. Patient ambulatory back, states he was unable to give a urine sample, he just had to have a bowel movement.

## 2023-03-29 NOTE — ED Provider Notes (Signed)
 [place] EMERGENCY DEPARTMENT AT [facility] Provider Note   CSN: [id number] Arrival date & time: 03/29/23  1323     History  Chief Complaint  Patient presents with   Bradycardia   Hematuria    Jason Day is a 83 y.o. male.  HPI 83 year old male presents from the PCP office with shortness of breath and bradycardia.  The patient is an overall poor historian and is frequently talking about a lot of his chronic issues such as troubles with his bowels and on and off hematuria.  He is being followed for GI and has an upcoming CT of the abdomen and pelvis. He has been having hematuria when he for started to urinate occasionally for the past couple weeks.  He has been having progressive dizziness with standing and shortness of breath.  He denies any chest pain or headache.  He went to his PCPs office and they sent him to the emergency department.  Home Medications Prior to Admission medications   Medication Sig Start Date End Date Taking? Authorizing Provider  amitriptyline  (ELAVIL ) 10 MG tablet Take 1 tablet (10 mg total) by mouth at bedtime. 02/01/23   Stacia Glendia BRAVO, MD  atorvastatin  (LIPITOR) 10 MG tablet Take 1 tablet (10 mg total) by mouth daily. 10/11/22   Nishan, Peter C, MD  colestipol  (COLESTID ) 1 g tablet TAKE 1TAB BY MOUTH 2TIMES DAILY*TAKE THIS MEDICATION AT LEAST 1HR AFTER TAKING YOUR OTHER MEDS. 12/28/22   Stacia Glendia BRAVO, MD  dapagliflozin  propanediol (FARXIGA ) 10 MG TABS tablet Take 1 tablet (10 mg total) by mouth daily before breakfast. 01/31/23   Rolan Ezra RAMAN, MD  sacubitril -valsartan  (ENTRESTO ) 49-51 MG Take 1 tablet by mouth 2 (two) times daily. 10/11/22   Delford Maude BROCKS, MD  spironolactone  (ALDACTONE ) 25 MG tablet Take 1 tablet (25 mg total) by mouth daily. 10/11/22   Nishan, Peter C, MD  warfarin (COUMADIN ) 5 MG tablet Take 1-1.5 tablets (5-7.5 mg total) by mouth daily as directed by coumadin  clinic. 03/19/23   Nishan, Peter C, MD  ipratropium  (ATROVENT ) 0.06 % nasal spray Place 2 sprays into both nostrils 4 (four) times daily. 02/08/23 03/19/23  Lorin Norris, MD      Allergies    Patient has no known allergies.    Review of Systems   Review of Systems  Constitutional:  Negative for fever.  Respiratory:  Positive for shortness of breath.   Cardiovascular:  Negative for chest pain.  Gastrointestinal:  Negative for abdominal pain.  Genitourinary:  Positive for hematuria.  Neurological:  Positive for dizziness. Negative for headaches.    Physical Exam Updated Vital Signs BP (!) 142/76   Pulse (!) 40   Temp 97.6 F (36.4 C) (Oral)   Resp (!) 23   Wt 72 kg   SpO2 96%   BMI 22.78 kg/m  Physical Exam Vitals and nursing note reviewed.  Constitutional:      Appearance: He is well-developed.  HENT:     Head: Normocephalic and atraumatic.  Cardiovascular:     Rate and Rhythm: Bradycardia present. Rhythm irregular.     Heart sounds: Normal heart sounds.  Pulmonary:     Effort: Pulmonary effort is normal.     Breath sounds: Normal breath sounds. No wheezing, rhonchi or rales.  Abdominal:     General: There is no distension.     Palpations: Abdomen is soft.     Tenderness: There is no abdominal tenderness.  Skin:    General: Skin  is warm and dry.  Neurological:     Mental Status: He is alert.     Comments: Awake, alert, oriented to person, place, time. 5/5 strength in all 4 extremities. Normal speech, no facial droop.     ED Results / Procedures / Treatments   Labs (all labs ordered are listed, but only abnormal results are displayed) Labs Reviewed  CBC WITH DIFFERENTIAL/PLATELET - Abnormal; Notable for the following components:      Result Value   RBC 4.19 (*)    Hemoglobin 12.9 (*)    Platelets 102 (*)    All other components within normal limits  PROTIME-INR - Abnormal; Notable for the following components:   Prothrombin Time 51.7 (*)    INR 5.7 (*)    All other components within normal limits   COMPREHENSIVE METABOLIC PANEL - Abnormal; Notable for the following components:   Glucose, Bld 101 (*)    BUN 25 (*)    Creatinine, Ser 1.54 (*)    Total Bilirubin 2.2 (*)    GFR, Estimated 45 (*)    All other components within normal limits  BRAIN NATRIURETIC PEPTIDE - Abnormal; Notable for the following components:   B Natriuretic Peptide 1,287.3 (*)    All other components within normal limits  I-STAT CHEM 8, ED - Abnormal; Notable for the following components:   BUN 28 (*)    Creatinine, Ser 1.70 (*)    Calcium , Ion 1.13 (*)    Hemoglobin 12.9 (*)    HCT 38.0 (*)    All other components within normal limits  TROPONIN I (HIGH SENSITIVITY) - Abnormal; Notable for the following components:   Troponin I (High Sensitivity) 94 (*)    All other components within normal limits  MAGNESIUM   URINALYSIS, ROUTINE W REFLEX MICROSCOPIC  TROPONIN I (HIGH SENSITIVITY)    EKG EKG Interpretation Date/Time:  Friday March 29 2023 14:26:51 EST Ventricular Rate:  44 PR Interval:  77 QRS Duration:  145 QT Interval:  531 QTC Calculation: 455 R Axis:   -65  Text Interpretation: Atrial fibrillation IVCD, consider atypical RBBB LVH with IVCD, LAD and secondary repol abnrm Probable inferior infarct, recent Confirmed by Freddi Hamilton [phone]) on 03/29/2023 2:39:06 PM  Radiology DG Chest Portable 1 View Result Date: 03/29/2023 CLINICAL DATA:  Dyspnea EXAM: PORTABLE CHEST 1 VIEW COMPARISON:  Chest radiograph dated 02/11/2022 FINDINGS: Normal lung volumes. Mild bilateral interstitial patchy opacities. No pleural effusion or pneumothorax. Similar enlarged cardiomediastinal silhouette. Left shoulder arthroplasty. Median sternotomy wires are nondisplaced. IMPRESSION: 1. Mild bilateral interstitial and patchy opacities, which may represent pulmonary edema. 2. Similar cardiomegaly. Electronically Signed   By: Limin  Xu M.D.   On: 03/29/2023 15:25    Procedures Procedures    Medications Ordered in  ED Medications  furosemide  (LASIX ) injection 20 mg (has no administration in time range)    ED Course/ Medical Decision Making/ A&P                                 Medical Decision Making Amount and/or Complexity of Data Reviewed Labs: ordered. Radiology: ordered.  Risk Prescription drug management.   Patient presents with subacute symptoms but it sounds like he is having symptomatic bradycardia as well as some CHF.  He has a significant history of poor EF known at baseline.  He has known A-fib and is on a small dose of metoprolol  but currently his heart rates have been  consistently in the 40s here and often in the 30s.  I suspect that the bradycardia and CHF is contributing to his symptoms.  I recommended he be admitted for this and did briefly discussed with Dr. Mona, cardiology had planned to consult but now patient is leaving AMA.  He just does not want to stay.  He seems understand what I am concerned about and potential risks including death.  I have strongly recommend the patient's stop his metoprolol  and this appears to be his only rate controlling medication.  I have also given him a dose of IV Lasix  in the emergency department.  After discussion with Dr. Mona, we also decided to hold his warfarin today, tomorrow, and the next day, to resume on Monday 2/10 for his supratherapeutic INR.  He is not having any active bleeding besides intermittent hematuria.  Hemoglobin is stable and close to baseline.  Patient was made aware that he can and should return at any time should he change his mind or get worse.        Final Clinical Impression(s) / ED Diagnoses Final diagnoses:  Symptomatic bradycardia  Supratherapeutic INR    Rx / DC Orders ED Discharge Orders          Ordered    Ambulatory referral to Cardiology       Comments: If you have not heard from the Cardiology office within the next 72 hours please call [phone].   03/29/23 1554               Freddi Hamilton, MD 03/29/23 1600

## 2023-03-29 NOTE — Telephone Encounter (Signed)
 Inbound call from patient's wife, requesting if CT scan can be done in [facility], Patient has been recently hospitalized and she states she is unsure of when he will be discharged. She also states she is unsure if patient will be able to come to the appointment on 2/12 but will keep [place] updated

## 2023-03-29 NOTE — H&P (Addendum)
 History and Physical      Jason Day DOB: 1940/05/04 DOA: 03/29/2023; DOS: 03/29/2023  PCP: [name], PA-C  Patient coming from: home   I have personally briefly reviewed patient's old medical records in [facility]  Chief Complaint: Shortness of breath  HPI: Jason Day is a 83 y.o. 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   The patient reports to 3 days of shortness of breath associated with worsening of edema in the bilateral lower extremities.  He denies any associated chest pain, palpitations, diaphoresis, nausea, vomiting, dizziness, presyncope, or syncope.  He also denies any recent subjective fever, chills, rigors, or generalized myalgias.  He has a history of chronic biventricular systolic heart failure, with most recent echocardiogram performed in January 2024 notable for LVEF 25 to 30%, moderately dilated left ventricular cavity size, indeterminate diastolic parameters, severely reduced right ventricular systolic function, severely dilated left atrium, mildly dilated right atrium and moderate mitral regurgitation.  He reports good compliance with his outpatient cardiac medications, including goal-directed heart failure therapy.  His outpatient cardiac medications include Entresto , dapagliflozin , spironolactone  and metoprolol  succinate 12.5 mg p.o. twice daily.  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  No recent melena or hematochezia, nor any recent  hematemesis.     ED Course:  Vital signs in the ED were notable for the following: Afebrile; heart rates in the 40s to 60s; systolic pressures in the low 899d to 150s; respiratory rate 14-24, oxygen saturation 96 to 100% on room air.  Labs were notable for the following: CMP, which was notable for sodium 140, potassium 4.9, bicarbonate 25, creatinine 1.54 compared to most recent provide 1.45 on 02/14/2023, total bilirubin 2.2, otherwise, liver enzymes are within normal limits.  Magnesium  level 2.2.  BNP 1287 compared to most recent prior value of 712 on 10/11/2022.  High-sensitivity troponin I initially noted to be 94, with repeat value trending up slightly to 110 and relative to most recent prior high sensitive troponin I data 0.4 on 02/11/2022.  CBC notable for white cell count 5200, hemoglobin 12.9 associated with normocytic/Norocarp properties and nonelevated RDW and relative to most recent prior hemoglobin value of 13.9 on 02/06/2023.  INR 5.7.  Urinalysis notable for no white blood cells, leukocyte esterase/nitrate negative, no bacteria, 6-10 RBCs.  Per my interpretation, EKG in ED demonstrated the following: Atrial fibrillation with intraventricular conduction delay, heart rate 44, nonspecific T wave version in aVL and V1, nonspecific less than 1 mm ST depression in V2, no evidence of ST elevation.  Imaging in the ED, per corresponding formal radiology read, was notable for the following: Chest x-ray, 1 view, showed stable cardiomegaly with mild bilateral interstitial and patchy airspace opacities suggestive of pulmonary edema in the absence of any evidence of infiltrate, pleural effusion, or pneumothorax.  EDP has discussed patient's case with on-call [facility] cardiology, Dr. Elmira , who recommends TRH admission and conveys that cardiology will consult. Dr. Elmira conveys that the patient's mild bradycardia is consistent with pt's baseline HR, and, in the setting of a history of  tachybradycardia syndrome, recommends continuation of home  metoprolol  succinate 12.5 mg p.o. daily.  Additionally, he recommends additional IV diuresis, specifically recommending Lasix  40 mg IV twice daily.   While in the ED, the following were administered: Lasix  20 mg IV x 1 dose.  Subsequently, the patient was admitted for further evaluation and management of presenting acute on chronic biventricular systolic heart failure, with presenting labs also notable for mildly supratherapeutic INR as well as mildly elevated troponin.     Review of Systems: As per HPI otherwise 10 point review of systems negative.   Past Medical History:  Diagnosis Date   Acute combined systolic and diastolic congestive heart failure (HCC) 10/16/2016   Allergic rhinitis 09/01/2020   Anal fissure    Angio-edema    Bleeding hemorrhoids    Chronic combined systolic and diastolic heart failure (HCC)    Chronic sinus complaints    overuses afrin   Coronary atherosclerosis of native coronary artery    Dyspnea    Encounter for medical examination to establish care 04/22/2017   Gallstones    Hyperlipidemia    Irritable bowel syndrome 06/07/2017   Ischemic cardiomyopathy    Left atrial enlargement    Long term (current) use of anticoagulants 12/30/2020   Moderate mitral regurgitation    Myocardial infarction (HCC) 09/2015   Persistent atrial fibrillation (HCC)    RBBB    A- Fib   Shortness of breath 12/29/2020   Urinary incontinence 12/30/2020    Past Surgical History:  Procedure Laterality Date   BACK SURGERY  ~2014   disc repair   CARDIOVERSION N/A 10/24/2018   Procedure: CARDIOVERSION;  Surgeon: Jeffrie Oneil BROCKS, MD;  Location: [facility] ENDOSCOPY;  Service: Cardiovascular;  Laterality: N/A;   CHOLECYSTECTOMY     COLONOSCOPY  04/06/2014   Hemorrhoids and diverticulosis performed in [place]    COLONOSCOPY WITH PROPOFOL  N/A 08/04/2019   Procedure: COLONOSCOPY WITH PROPOFOL ;  Surgeon: Lennard Lesta FALCON, MD;   Location: [facility] ENDOSCOPY;  Service: Endoscopy;  Laterality: N/A;   CORONARY ARTERY BYPASS GRAFT N/A 10/18/2016   Procedure: CORONARY ARTERY BYPASS GRAFTING (CABG) x five , using left internal mammary artery and right leg greater saphenous vein harvested endoscopically;  Surgeon: Lucas Dorise POUR, MD;  Location: [facility] OR;  Service: Open Heart Surgery;  Laterality: N/A;   ESOPHAGOGASTRODUODENOSCOPY (EGD) WITH PROPOFOL  N/A 08/04/2019   Procedure: ESOPHAGOGASTRODUODENOSCOPY (EGD) WITH PROPOFOL ;  Surgeon: Lennard Lesta FALCON, MD;  Location: [facility] ENDOSCOPY;  Service: Endoscopy;  Laterality: N/A;   FOOT SURGERY     HEMORRHOID BANDING     HEMORRHOID SURGERY     REVERSE SHOULDER ARTHROPLASTY Left 08/21/2018   Procedure: REVERSE SHOULDER ARTHROPLASTY;  Surgeon: Melita Drivers, MD;  Location: [facility] ORS;  Service: Orthopedics;  Laterality: Left;   RIGHT/LEFT HEART CATH AND CORONARY ANGIOGRAPHY N/A 10/16/2016   Procedure: RIGHT/LEFT HEART CATH AND CORONARY ANGIOGRAPHY;  Surgeon: Claudene Victory ORN, MD;  Location: [facility] INVASIVE CV LAB;  Service: Cardiovascular;  Laterality: N/A;   stents in liver     TEE WITHOUT CARDIOVERSION N/A 10/18/2016   Procedure: TRANSESOPHAGEAL ECHOCARDIOGRAM (TEE);  Surgeon: Lucas Dorise POUR, MD;  Location: [facility] OR;  Service: Open Heart Surgery;  Laterality: N/A;   TRANSURETHRAL RESECTION OF PROSTATE N/A 11/19/2019   Procedure: CYSTOSCOPY TRANSURETHRAL RESECTION OF THE PROSTATE (TURP);  Surgeon: Watt Rush, MD;  Location: [facility] ORS;  Service: Urology;  Laterality: N/A;    Social History:  reports that he quit smoking about 55 years ago. His smoking use included cigarettes. He has never been exposed to tobacco  smoke. He has never used smokeless tobacco. He reports that he does not drink alcohol  and does not use drugs.   No Known Allergies  Family History  Problem Relation Age of Onset   COPD Father    Emphysema Father    Healthy Brother    Diabetes Neg Hx    Cancer Neg Hx    Stomach cancer Neg Hx    Colon  cancer Neg Hx    Allergic rhinitis Neg Hx    Angioedema Neg Hx    Asthma Neg Hx    Eczema Neg Hx    Urticaria Neg Hx     Family history reviewed and not pertinent    Prior to Admission medications   Medication Sig Start Date End Date Taking? Authorizing Provider  amitriptyline  (ELAVIL ) 10 MG tablet Take 1 tablet (10 mg total) by mouth at bedtime. 02/01/23   Stacia Glendia BRAVO, MD  atorvastatin  (LIPITOR) 10 MG tablet Take 1 tablet (10 mg total) by mouth daily. 10/11/22   Nishan, Peter C, MD  colestipol  (COLESTID ) 1 g tablet TAKE 1TAB BY MOUTH 2TIMES DAILY*TAKE THIS MEDICATION AT LEAST 1HR AFTER TAKING YOUR OTHER MEDS. 12/28/22   Stacia Glendia BRAVO, MD  dapagliflozin  propanediol (FARXIGA ) 10 MG TABS tablet Take 1 tablet (10 mg total) by mouth daily before breakfast. 01/31/23   Rolan Ezra RAMAN, MD  sacubitril -valsartan  (ENTRESTO ) 49-51 MG Take 1 tablet by mouth 2 (two) times daily. 10/11/22   Nishan, Peter C, MD  spironolactone  (ALDACTONE ) 25 MG tablet Take 1 tablet (25 mg total) by mouth daily. 10/11/22   Nishan, Peter C, MD  warfarin (COUMADIN ) 5 MG tablet Take 1-1.5 tablets (5-7.5 mg total) by mouth daily as directed by coumadin  clinic. 03/19/23   Nishan, Peter C, MD  ipratropium (ATROVENT ) 0.06 % nasal spray Place 2 sprays into both nostrils 4 (four) times daily. 02/08/23 03/19/23  Lorin Norris, MD     Objective    Physical Exam: Vitals:   03/29/23 1745 03/29/23 1800 03/29/23 1830 03/29/23 1900  BP: 136/72 (!) 99/53 (!) 83/45 103/60  Pulse: (!) 38 64 61 66  Resp: 17 (!) 21 20 (!) 24  Temp:      TempSrc:      SpO2: 99% 100% 100% 100%  Weight:        General: appears to be stated age; alert, oriented Skin: warm, dry, no rash Head:  AT/[place] Mouth:  Oral mucosa membranes appear moist, normal dentition Neck: supple; trachea midline Heart: Irregular; 2 out of 6 holosystolic murmur  Lungs: CTAB, did not appreciate any wheezes, rales, or rhonchi Abdomen: + BS; soft, ND,  NT Vascular: 2+ pedal pulses b/l; 2+ radial pulses b/l Extremities: 1-2+ edema bilateral lower extremities, no muscle wasting Neuro: strength and sensation intact in upper and lower extremities b/l    Labs on Admission: I have personally reviewed following labs and imaging studies  CBC: Recent Labs  Lab 03/29/23 1349 03/29/23 1413  WBC 5.2  --   NEUTROABS 3.9  --   HGB 12.9* 12.9*  HCT 39.0 38.0*  MCV 93.1  --   PLT 102*  --    Basic Metabolic Panel: Recent Labs  Lab 03/29/23 1349 03/29/23 1413  NA 140 141  K 4.9 4.9  CL 106 105  CO2 25  --   GLUCOSE 101* 95  BUN 25* 28*  CREATININE 1.54* 1.70*  CALCIUM  9.0  --   MG 2.2  --    GFR: Estimated Creatinine Clearance:  34.1 mL/min (A) (by C-G formula based on SCr of 1.7 mg/dL (H)). Liver Function Tests: Recent Labs  Lab 03/29/23 1349  AST 20  ALT 20  ALKPHOS 88  BILITOT 2.2*  PROT 6.5  ALBUMIN  3.7   No results for input(s): LIPASE, AMYLASE in the last 168 hours. No results for input(s): AMMONIA in the last 168 hours. Coagulation Profile: Recent Labs  Lab 03/29/23 1349  INR 5.7*   Cardiac Enzymes: No results for input(s): CKTOTAL, CKMB, CKMBINDEX, TROPONINI in the last 168 hours. BNP (last 3 results) No results for input(s): PROBNP in the last 8760 hours. HbA1C: No results for input(s): HGBA1C in the last 72 hours. CBG: No results for input(s): GLUCAP in the last 168 hours. Lipid Profile: No results for input(s): CHOL, HDL, LDLCALC, TRIG, CHOLHDL, LDLDIRECT in the last 72 hours. Thyroid  Function Tests: No results for input(s): TSH, T4TOTAL, FREET4, T3FREE, THYROIDAB in the last 72 hours. Anemia Panel: No results for input(s): VITAMINB12, FOLATE, FERRITIN, TIBC, IRON, RETICCTPCT in the last 72 hours. Urine analysis:    Component Value Date/Time   COLORURINE STRAW (A) 03/29/2023 1850   APPEARANCEUR CLEAR 03/29/2023 1850   LABSPEC 1.004 (L)  03/29/2023 1850   PHURINE 5.0 03/29/2023 1850   GLUCOSEU NEGATIVE 03/29/2023 1850   GLUCOSEU NEGATIVE 04/22/2017 1431   HGBUR SMALL (A) 03/29/2023 1850   BILIRUBINUR NEGATIVE 03/29/2023 1850   KETONESUR NEGATIVE 03/29/2023 1850   PROTEINUR NEGATIVE 03/29/2023 1850   UROBILINOGEN 1.0 04/22/2017 1431   NITRITE NEGATIVE 03/29/2023 1850   LEUKOCYTESUR NEGATIVE 03/29/2023 1850    Radiological Exams on Admission: DG Chest Portable 1 View Result Date: 03/29/2023 CLINICAL DATA:  Dyspnea EXAM: PORTABLE CHEST 1 VIEW COMPARISON:  Chest radiograph dated 02/11/2022 FINDINGS: Normal lung volumes. Mild bilateral interstitial patchy opacities. No pleural effusion or pneumothorax. Similar enlarged cardiomediastinal silhouette. Left shoulder arthroplasty. Median sternotomy wires are nondisplaced. IMPRESSION: 1. Mild bilateral interstitial and patchy opacities, which may represent pulmonary edema. 2. Similar cardiomegaly. Electronically Signed   By: Limin  Xu M.D.   On: 03/29/2023 15:25      Assessment/Plan   Principal Problem:   Acute on chronic systolic (congestive) heart failure (HCC) Active Problems:   Elevated troponin   Permanent atrial fibrillation (HCC)   Hyperlipidemia   Depression, major, single episode, mild (HCC)   Supratherapeutic INR   CKD stage 3a, GFR 45-59 ml/min (HCC)    #) Acute on chronic systolic heart failure: dx of acute decompensation on the basis of presenting to 3 days of worsening shortness of breath associated with increase in edema in the bilateral lower extremities, with interval increase in BNP, as quantified above, as well as chest x-ray showing interval development of pulmonary edema. This is in the context of a known history of chronic biventricular systolic heart failure, with most recent echocardiogram performed in January 2024, which was notable for LVEF 25 to 30%, indeterminate diastolic parameters as well as severely decreased right ventricular systolic function,  with additional details as conveyed above.   Etiology leading to presenting acutely decompensated heart failure is not entirely clear at this time. I suspect that mildly elevated initial troponin is a consequence of underlying acutely decompensated heart failure as opposed to representing ACS causing presenting acute heart failure exacerbation, particularly in the absence of any recent CP, and with presenting EKG showing no evidence of acute ischemic changes. However, will continue to evaluate with further trending of troponin and close monitoring on tele. Patient conveys good compliance with home  diuretic therapy, which consists of spironolactone , as well as good compliance with their additional home goal-directed cardiac medications, which include Entresto , dapagliflozin , metoprolol  succinate.   [facility] cardiology to consult, as above, recommending IV diuresis with Lasix  40 mg IV twice daily.  They felt that patient's bradycardia heart rate was at baseline, and recommended continuation of home beta-blocker.  It is noted that he is at increased risk for preload dependent pathophysiology given his concomitant chronic right-sided systolic heart failure.  Of note, patient received Lasix  20 mg IV x 1 while in the ED today.    Plan: monitor strict I's & O's and daily weights. Monitor on telemetry. Repeat CMP in the morning, including for monitoring trend of potassium, bicarbonate, and renal function in response to interval diuresis efforts.  Check magnesium  level in the morning. Close monitoring of ensuing blood pressure response to diuresis efforts, including to help guide need for improvement in afterload reduction in order to optimize cardiac output. Trend troponin.  [facility] cardiology to formally consult, as above.  Lasix  40 mg IV twice daily, per cardiology recommendation. Continue outpatient BB, Entresto  . Will hold home spironolactone  in setting of increased risk for preload dependent pathophysiology related  to the patient's chronic right-sided heart failure.  Echocardiogram ordered for the morning.                  #) Supratherapeutic INR: In the context of being chronically anticoagulated on warfarin in the setting of known atrial fibrillation, his presenting INR was found to be mildly elevated at 5.7.  straw coloration of urinalysis is noted, without overt evidence of gross hematuria.  No evidence to suggest acute gastrointestinal bleed at this time.  Overall, there is not appear to be an indication for administration of reversal agent at this time.  Will hold next dose of warfarin, with repeat INR in the morning,  Plan: Repeat INR in the morning.  Holding home warfarin for now.  Will plan for inpatient pharmacy consultation, once INR improved, for their assistance with dosing of warfarin during this hospitalization.  Repeat CBC in the morning.                    #) Permanent atrial fibrillation: Documented history of such, while noting the patient's severely dilated left atrium on most recent prior echocardiogram in January 2024, along with moderate mitral regurgitation observed at that time, with additional results of this most recent echocardiogram described above. In setting of CHA2DS2-VASc score of 4, there is an indication for chronic anticoagulation for thromboembolic prophylaxis. Consistent with this, patient is chronically anticoagulated on warfarin, with presenting INR mildly supratherapeutic, as further detailed above. Home AV nodal blocking regimen: Metoprolol  succinate 12.5 mg p.o. daily.  Presenting EKG shows atrial fibrillation, without overt evidence of acute ischemic changes.   Per [facility] cardiology consult, in the setting of the patient's history of tachybradycardia syndrome, and presenting baseline heart rates, cardiology recommends resumption of outpatient metoprolol  succinate 12.5 mg p.o. daily.  Plan: monitor strict I's & O's and daily weights. CMP/CBC  in AM. Check serum mag level. Continue home AV nodal blocking regimen, per cardiology consult, with hold parameters for bradycardia.  Hold next dose of warfarin, and repeat INR in the morning, with plan to consult inpatient pharmacy once INR has improved.  Monitor on telemetry.  Follow-up for result of updated echocardiogram, which has been ordered for the morning.                  #)  Hyperlipidemia: documented h/o such. On atorvastatin  as outpatient.   Plan: continue home statin.                  #) CKD Stage 3A: Documented history of such, with baseline creatinine 1.3-1.6, with presenting creatinine consistent with this baseline.  Will closely monitor ensuing renal function trend given plan for additional IV diuresis per cardiology recommendation in the setting of presenting acute on chronic systolic heart failure.  Plan: Monitor strict I's and O's and daily weights.  Attempt to avoid nephrotoxic agents.  CMP/magnesium  level in the AM.      DVT prophylaxis: SCD's; (additionally, in the setting of chronic use of warfarin, presenting INR found to be supratherapeutic, as outlined above) Code Status: Full code Family Communication: none Disposition Plan: Per Rounding Team Consults called: EDP d/w on-call [facility] cardiology, Dr. Elmira, who will formally consult, as further detailed above;  Admission status: Inpatient     I SPENT GREATER THAN 75  MINUTES IN CLINICAL CARE TIME/MEDICAL DECISION-MAKING IN COMPLETING THIS ADMISSION.      Eva NOVAK Sekai Gitlin DO [facility]  From 7PM - 7AM   03/29/2023, 7:34 PM

## 2023-03-29 NOTE — Consult Note (Signed)
 Cardiology Consultation   Patient ID: Jason Day MRN: [id number]; DOB: 02-08-1941  Admit date: 03/29/2023 Date of Consult: 03/29/2023  PCP:  [name], PA-C   [facility] Providers Cardiologist:  None  Electrophysiologist:  OLE ONEIDA HOLTS, MD  {  Patient Profile:   Jason Day is a 83 y.o. 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 Freddi.  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 He presented to the ED via EMS sent over from PCP Alm Bulah, PA-C due to bradycardia and hematuria.   When speaking to the patient, who is a relatively poor historian, and his wife they are on different pages regarding his health. The patient does not seem to be concerned about conditions outside the need for a CT scan of his abdomen to figure out why he has been having constant diarrhea and abdominal pain. He earlier stated that he wanted to leave AMA and his wife wants him to stay in the hospital to get checked out.   His wife reports that he has been having declining health recently, sleeping 15-18 hours, unable to do most of his ADLs without becoming short of breath, has been dealing with GI  upset/diarrhea for some time.   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 He was being treated with Toprol  12.5 mg daily, Entresto  49-15 mg BID, Farxiga  10 mg, Warfarin, Lipitor 10 mg daily, spironolactone  25 mg.   He presented with hematuria and bradycardia. He was found to have an INR of 5.7 therefore hematuria likely secondary his warfarin. His HR normally is around 60-70s and has gone back up to  mid/high 40s since being in the ED.    Past Medical History:  Diagnosis Date   Acute combined systolic and diastolic congestive heart failure (HCC) 10/16/2016   Allergic rhinitis 09/01/2020   Anal fissure    Angio-edema    Bleeding hemorrhoids    Chronic combined systolic and diastolic heart failure (HCC)    Chronic sinus complaints    overuses afrin   Coronary atherosclerosis of native coronary artery    Dyspnea    Encounter for medical examination to establish care 04/22/2017   Gallstones    Hyperlipidemia    Irritable bowel syndrome 06/07/2017   Ischemic cardiomyopathy    Left atrial enlargement    Long term (current) use of anticoagulants 12/30/2020   Moderate mitral regurgitation    Myocardial infarction (HCC) 09/2015   Persistent atrial fibrillation (HCC)    RBBB    A- Fib   Shortness of breath 12/29/2020   Urinary incontinence 12/30/2020    Past Surgical History:  Procedure Laterality Date   BACK SURGERY  ~2014   disc repair   CARDIOVERSION N/A 10/24/2018   Procedure: CARDIOVERSION;  Surgeon: Jeffrie Oneil BROCKS, MD;  Location: [facility] ENDOSCOPY;  Service: Cardiovascular;  Laterality: N/A;   CHOLECYSTECTOMY     COLONOSCOPY  04/06/2014   Hemorrhoids and diverticulosis performed in [place]    COLONOSCOPY WITH PROPOFOL  N/A 08/04/2019   Procedure: COLONOSCOPY WITH PROPOFOL ;  Surgeon: Lennard Lesta FALCON, MD;  Location: [facility] ENDOSCOPY;  Service: Endoscopy;  Laterality: N/A;   CORONARY ARTERY  BYPASS GRAFT N/A 10/18/2016   Procedure: CORONARY ARTERY BYPASS GRAFTING (CABG) x five , using left internal mammary artery and right leg greater saphenous vein harvested endoscopically;  Surgeon: Lucas Dorise POUR, MD;  Location: [facility] OR;  Service: Open Heart Surgery;  Laterality: N/A;   ESOPHAGOGASTRODUODENOSCOPY (EGD) WITH PROPOFOL  N/A 08/04/2019   Procedure: ESOPHAGOGASTRODUODENOSCOPY (EGD) WITH PROPOFOL ;  Surgeon: Lennard Lesta FALCON, MD;  Location: [facility] ENDOSCOPY;  Service: Endoscopy;  Laterality: N/A;   FOOT  SURGERY     HEMORRHOID BANDING     HEMORRHOID SURGERY     REVERSE SHOULDER ARTHROPLASTY Left 08/21/2018   Procedure: REVERSE SHOULDER ARTHROPLASTY;  Surgeon: Melita Drivers, MD;  Location: [facility] ORS;  Service: Orthopedics;  Laterality: Left;   RIGHT/LEFT HEART CATH AND CORONARY ANGIOGRAPHY N/A 10/16/2016   Procedure: RIGHT/LEFT HEART CATH AND CORONARY ANGIOGRAPHY;  Surgeon: Claudene Victory ORN, MD;  Location: [facility] INVASIVE CV LAB;  Service: Cardiovascular;  Laterality: N/A;   stents in liver     TEE WITHOUT CARDIOVERSION N/A 10/18/2016   Procedure: TRANSESOPHAGEAL ECHOCARDIOGRAM (TEE);  Surgeon: Lucas Dorise POUR, MD;  Location[facility] OR;  Service: Open Heart Surgery;  Laterality: N/A;   TRANSURETHRAL RESECTION OF PROSTATE N/A 11/19/2019   Procedure: CYSTOSCOPY TRANSURETHRAL RESECTION OF THE PROSTATE (TURP);  Surgeon: Watt Rush, MD;  Location: [facility] ORS;  Service: Urology;  Laterality: N/A;     Home Medications:  Prior to Admission medications   Medication Sig Start Date End Date Taking? Authorizing Provider  amitriptyline  (ELAVIL ) 10 MG tablet Take 1 tablet (10 mg total) by mouth at bedtime. 02/01/23   Stacia Glendia BRAVO, MD  atorvastatin  (LIPITOR) 10 MG tablet Take 1 tablet (10 mg total) by mouth daily. 10/11/22   Nishan, Peter C, MD  colestipol  (COLESTID ) 1 g tablet TAKE 1TAB BY MOUTH 2TIMES DAILY*TAKE THIS MEDICATION AT LEAST 1HR AFTER TAKING YOUR OTHER MEDS. 12/28/22   Stacia Glendia BRAVO, MD  dapagliflozin  propanediol (FARXIGA ) 10 MG TABS tablet Take 1 tablet (10 mg total) by mouth daily before breakfast. 01/31/23   Rolan Ezra RAMAN, MD  sacubitril -valsartan  (ENTRESTO ) 49-51 MG Take 1 tablet by mouth 2 (two) times daily. 10/11/22   Delford Maude BROCKS, MD  spironolactone  (ALDACTONE ) 25 MG tablet Take 1 tablet (25 mg total) by mouth daily. 10/11/22   Nishan, Peter C, MD  warfarin (COUMADIN ) 5 MG tablet Take 1-1.5 tablets (5-7.5 mg total) by mouth daily as directed by coumadin  clinic. 03/19/23   Nishan, Peter C, MD   ipratropium (ATROVENT ) 0.06 % nasal spray Place 2 sprays into both nostrils 4 (four) times daily. 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  Tobacco Use   Smoking status: Former    Current packs/day: 0.00    Types: Cigarettes    Quit date: 1970    Years since quitting: 55.1    Passive exposure: Never   Smokeless tobacco: Never   Tobacco comments:    Quit in 1970  Vaping Use   Vaping status: Never Used  Substance and Sexual Activity   Alcohol  use: No  Drug use: No   Sexual activity: Yes  Other Topics Concern   Not on file  Social History Narrative   Married    Originally from [place]  moved to [place]  age 15 moved to [facility] 2018   1 son Jshaun Abernathy in [place]   1dtr [place]       Has living will   Wife is health care POA--then son   Would accept resuscitation   No tube feeds if cognitively unaware   Social Drivers of Health   Financial Resource Strain: Low Risk  (12/04/2022)   Overall Financial Resource Strain (CARDIA)    Difficulty of Paying Living Expenses: Not hard at all  Food Insecurity: No Food Insecurity (12/04/2022)   Hunger Vital Sign    Worried About Running Out of Food in the Last Year: Never true    Ran Out of Food in the Last Year: Never true  Transportation Needs: No Transportation Needs (12/04/2022)   PRAPARE - Administrator, Civil Service (Medical): No    Lack of Transportation (Non-Medical): No  Physical Activity: Insufficiently Active (12/04/2022)   Exercise Vital Sign    Days of Exercise per Week: 3 days     Minutes of Exercise per Session: 30 min  Stress: No Stress Concern Present (12/04/2022)   [employer] of Occupational Health - Occupational Stress Questionnaire    Feeling of Stress : Not at all  Social Connections: Moderately Isolated (12/04/2022)   Social Connection and Isolation Panel [NHANES]    Frequency of Communication with Friends and Family: Never    Frequency of Social Gatherings with Friends and Family: Twice a week    Attends Religious Services: More than 4 times per year    Active Member of [employer] or Organizations: No    Attends Banker Meetings: Never    Marital Status: Married  Catering Manager Violence: Not At Risk (12/04/2022)   Humiliation, Afraid, Rape, and Kick questionnaire    Fear of Current or Ex-Partner: No    Emotionally Abused: No    Physically Abused: No    Sexually Abused: No    Family History:   Family History  Problem Relation Age of Onset   COPD Father    Emphysema Father    Healthy Brother    Diabetes Neg Hx    Cancer Neg Hx    Stomach cancer Neg Hx    Colon cancer Neg Hx    Allergic rhinitis Neg Hx    Angioedema Neg Hx    Asthma Neg Hx    Eczema Neg Hx    Urticaria Neg Hx      ROS:  Please see the history of present illness.  All other ROS reviewed and negative.     Physical Exam/Data:   Vitals:   03/29/23 1400 03/29/23 1415 03/29/23 1430 03/29/23 1530  BP: (!) 149/63 130/65 (!) 142/76 (!) 143/69  Pulse: (!) 43 95 (!) 40 (!) 37  Resp: (!) 21 19 (!) 23 (!) 23  Temp:      TempSrc:      SpO2: 100% 100% 96% 100%  Weight:       No intake or output data in the 24 hours ending 03/29/23 1736    03/29/2023    1:30 PM 03/29/2023   12:18 PM 02/28/2023   10:36 AM  Last 3 Weights  Weight (lbs) 158 lb 11.7 oz 158 lb 155 lb  Weight (kg) 72 kg 71.668 kg 70.308 kg  Body mass index is 22.78 kg/m.  General:  ambulating around the room, shaky but states it is because he is cold HEENT: normal Neck: no JVD Vascular:  Distal pulses 2+ bilaterally Cardiac:  bradycardic, irregular rhythm; no murmur  Lungs:  diminished breath sounds on left lung fields  Abd: soft, nontender Ext: bilaterally LE edema Musculoskeletal:  No deformities, BUE and BLE strength normal and equal Skin: warm and dry  Neuro:  no focal abnormalities noted Psych: seems a bit confused, wife in room notes that is his baseline   EKG:  The EKG was personally reviewed and demonstrates:  atrial fibrillation with slow ventricular response, HR 44 bpm, RBBB  Telemetry:  Telemetry was personally reviewed and demonstrates:  atrial fibrillation with HR 48  Relevant CV Studies: Echocardiogram (03/08/2022) IMPRESSIONS   1. Left ventricular ejection fraction, by estimation, is 25 to 30%. Left  ventricular ejection fraction by PLAX is 26 %. The left ventricle has  severely decreased function. The left ventricle demonstrates global  hypokinesis. The left ventricular internal   cavity size was moderately dilated. There is moderate asymmetric left  ventricular hypertrophy of the basal-septal segment. Indeterminate  diastolic filling due to E-A fusion.   2. Right ventricular systolic function is severely reduced. The right  ventricular size is mildly enlarged. There is normal pulmonary artery  systolic pressure. The estimated right ventricular systolic pressure is  33.6 mmHg.   3. Left atrial size was severely dilated.   4. Right atrial size was mildly dilated.   5. The mitral valve is degenerative. Moderate mitral valve regurgitation.   6. Significant thickening of the non-coronary cusp. The aortic valve is  tricuspid. There is moderate thickening of the aortic valve. Aortic valve  regurgitation is not visualized. No aortic stenosis is present. Aortic  valve mean gradient measures 4.0  mmHg.   7. The pulmonic valve was abnormal.   8. Aortic dilatation noted. There is borderline dilatation of the  ascending aorta, measuring 38 mm.   9. The  inferior vena cava is normal in size with <50% respiratory  variability, suggesting right atrial pressure of 8 mmHg.   Laboratory Data:  High Sensitivity Troponin:   Recent Labs  Lab 03/29/23 1349  TROPONINIHS 94*     Chemistry Recent Labs  Lab 03/29/23 1349 03/29/23 1413  NA 140 141  K 4.9 4.9  CL 106 105  CO2 25  --   GLUCOSE 101* 95  BUN 25* 28*  CREATININE 1.54* 1.70*  CALCIUM  9.0  --   MG 2.2  --   GFRNONAA 45*  --   ANIONGAP 9  --     Recent Labs  Lab 03/29/23 1349  PROT 6.5  ALBUMIN  3.7  AST 20  ALT 20  ALKPHOS 88  BILITOT 2.2*   Lipids No results for input(s): CHOL, TRIG, HDL, LABVLDL, LDLCALC, CHOLHDL in the last 168 hours.  Hematology Recent Labs  Lab 03/29/23 1349 03/29/23 1413  WBC 5.2  --   RBC 4.19*  --   HGB 12.9* 12.9*  HCT 39.0 38.0*  MCV 93.1  --   MCH 30.8  --   MCHC 33.1  --   RDW 14.3  --   PLT 102*  --    Thyroid  No results for input(s): TSH, FREET4 in the last 168 hours.  BNP Recent Labs  Lab 03/29/23 1350  BNP 1,287.3*    DDimer No results for input(s): DDIMER in the last 168 hours.  Radiology/Studies:  BOEING  Chest Portable 1 View Result Date: 03/29/2023 CLINICAL DATA:  Dyspnea EXAM: PORTABLE CHEST 1 VIEW COMPARISON:  Chest radiograph dated 02/11/2022 FINDINGS: Normal lung volumes. Mild bilateral interstitial patchy opacities. No pleural effusion or pneumothorax. Similar enlarged cardiomediastinal silhouette. Left shoulder arthroplasty. Median sternotomy wires are nondisplaced. IMPRESSION: 1. Mild bilateral interstitial and patchy opacities, which may represent pulmonary edema. 2. Similar cardiomegaly. Electronically Signed   By: Limin  Xu M.D.   On: 03/29/2023 15:25   Assessment and Plan:   Acute on chronic combined systolic and diastolic heart failure Most recent echo showed EF 25-30% Stable on GDMT, patient has declined ICD/CRT-D, unable to evaluate with cardiac MR due to claustrophobia  Patient received  IV Lasix  20 mg x 1 dose in ED Restarting home GDMT Continue Toprol  12.5 mg daily Continue Farxiga  10 mg daily Continue Spironolactone  25 mg daily Continue Entresto  49-51 mg BID Start IV Lasix  40 mg x 1, evaluate response and BMET  Elevated troponin CAD s/p CABG 94, awaiting second result  Denies any chest pain Does not appear to be ACS Continue Lipitor 10 mg daily   Chronic atrial fibrillation On warfarin therapy History of tachybrady syndrome  Bradycardia  Was last seen on 01/30/2023 by Dr. Nishan On warfarin therapy for [facility], presented with INR 5.7 Noted there was no indication for pacing per Dr. Cindie  Continue on Toprol  12.5 mg daily  Hold Warfarin due to hematuria and supratherapeutic INR  Mitral regurgitation Moderate on echo from 02/2022 Continue to follow clinically   Per primary Anemia Abdominal pain Diarrhea, IBS   Risk Assessment/Risk Scores:   New York  Heart Association (NYHA) Functional Class NYHA Class II  CHA2DS2-VASc Score = 5   This indicates a 7.2% annual risk of stroke. The patient's score is based upon: CHF History: 1 HTN History: 1 Diabetes History: 0 Stroke History: 0 Vascular Disease History: 1 Age Score: 2 Gender Score: 0      For questions or updates, please contact [facility] Please consult [email or web address] for contact info under    Signed, Waddell DELENA Donath, PA-C  03/29/2023 5:36 PM

## 2023-03-29 NOTE — Telephone Encounter (Signed)
 Spoke with pts wife and let her know the hospitalist can order the CT and have it done while he is in the hospital or the hospitalist could request a gi consult. She understands, states she does not like th ehospitalist system.

## 2023-03-29 NOTE — Discharge Instructions (Addendum)
 After Your Pacemaker  LEAVE THE CURRENT BULKY DRESSING IN PLACE, NO SHOWERING, DO NOT GET WET YOU WILL SEE ONE OF DR. 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 After 7 days, you may progress as below.  You should remove your sling 24 hours after your procedure, unless otherwise instructed by your provider.     Wednesday April 10, 2023  Thursday April 11, 2023 Friday April 12, 2023 Saturday April 13, 2023   Do not lift, push, pull, or carry anything over 10 pounds with the affected arm until 6 weeks (Wednesday May 15, 2023 ) after your procedure.   You may drive AFTER your wound check, unless you have been told otherwise by your provider.   Ask your healthcare provider when you can go back to work   INCISION/Dressing If you are on a blood thinner such as Coumadin , Xarelto , Eliquis , Plavix, or Pradaxa please confirm with your provider when this should be resumed. DO NOT RESUME YOUR WARFARIN UNTIL INSTRUCTED TO  If large square, outer bandage is left in place, this can be removed after 24 hours from your procedure. Do not remove steri-strips or glue as below.   If a PRESSURE DRESSING (a bulky dressing that usually goes up over your shoulder) was applied or left in place, please follow instructions given by your provider on when to return to have this removed.   Monitor your Pacemaker site for redness, swelling, and drainage. Call the device clinic at [phone] if you experience these symptoms or fever/chills.  If your incision is sealed with Steri-strips or staples, you may shower 7 days after your procedure or when told by your provider. Do not remove the steri-strips or let the shower hit directly on your site. You may wash around your site  with soap and water .    If you were discharged in a sling, please do not wear this during the day more than 48 hours after your surgery unless otherwise instructed. This may increase the risk of stiffness and soreness in your shoulder.   Avoid lotions, ointments, or perfumes over your incision until it is well-healed.  You may use a hot tub or a pool AFTER your wound check appointment if the incision is completely closed.  Pacemaker Alerts:  Some alerts are vibratory and others beep. These are NOT emergencies. Please call our office to let us  know. If this occurs at night or on weekends, it can wait until the next business day. Send a remote transmission.  If your device is capable of reading fluid status (for heart failure), you will be offered monthly monitoring to review this with you.   DEVICE MANAGEMENT Remote monitoring is used to monitor your pacemaker from home. This monitoring is scheduled every 91 days by our office. It allows us  to keep an eye on the functioning of your device to ensure it is working properly. You will routinely see your Electrophysiologist annually (more often if necessary).   You should receive your ID card for your new device in 4-8 weeks. Keep this card with you at all times once received. Consider wearing a medical alert bracelet or necklace.  Your Pacemaker may be MRI compatible. This will be discussed at your next office visit/wound check.  You should avoid contact with strong electric  or magnetic fields.   Do not use amateur (ham) radio equipment or electric (arc) welding torches. MP3 player headphones with magnets should not be used. Some devices are safe to use if held at least 12 inches (30 cm) from your Pacemaker. These include power tools, lawn mowers, and speakers. If you are unsure if something is safe to use, ask your health care provider.  When using your cell phone, hold it to the ear that is on the opposite side from the Pacemaker. Do not leave your  cell phone in a pocket over the Pacemaker.  You may safely use electric blankets, heating pads, computers, and microwave ovens.  Call the office right away if: You have chest pain. You feel more short of breath than you have felt before. You feel more light-headed than you have felt before. Your incision starts to open up.  This information is not intended to replace advice given to you by your health care provider. Make sure you discuss any questions you have with your health care provider.

## 2023-03-29 NOTE — Telephone Encounter (Signed)
 Called patient's wife (DPR) back about message. She stated that Jason Day is in the ED now and she is very concerned. She stated she wanted Dr. Delford to know what was going on, because he saved patient's life in the past. She stated patient has been in bed for about 5 weeks and confused at times. Will send message to Dr. Delford.

## 2023-03-29 NOTE — ED Triage Notes (Addendum)
 Patient BIB [facility] from PCP c/o sob.  Patient there d/t hematuria x 2 weeks, patient takes warfarin.  Patient also found to be brady with a HR of 30. Patient placed on o2 by PCP.  Patient states I don't want any medicine, I don't like medicine.  130/60 18 L AC

## 2023-03-29 NOTE — Progress Notes (Signed)
 Subjective:  Jason Day is a 83 y.o. male who presents for Chief Complaint  Patient presents with   Shortness of Breath    SOB, dizziness x 4 weeks. Freezing all the time. Wife has given him electrocytes but thinks he dehydration. Having trouble SOB, he needs to be sent to hospital- but he refuses and family has tried multiples. He needs some oxgyen to help with breathing. Would like something to come his nerves- he states he wants the good stuff. Patient is sleeping 15-18 hours a day, no energy     Here with wife today for not feeling well.  He has a history of atrial fibrillation, history of heart failure, coronary artery disease, mitral regurgitation, on anticoagulation.  She notes that for the past 4 weeks or so he has been not doing well.  His symptoms have included shortness of breath, weak, dizzy, disoriented at times, not able to move very far without holding onto the handrail.    He denies history of lung disease or asthma.  Non-smoker.  He denies cough, runny nose, sneezing, sore throat.  No chest pain or palpitations specifically but just has felt short of breath.  He does a history of heart disease.  He denies leg swelling.  His family try to get him to the hospital already recently but he declined.  No other aggravating or relieving factors.    No other c/o.  Past Medical History:  Diagnosis Date   Acute combined systolic and diastolic congestive heart failure (HCC) 10/16/2016   Allergic rhinitis 09/01/2020   Anal fissure    Angio-edema    Bleeding hemorrhoids    Chronic combined systolic and diastolic heart failure (HCC)    Chronic sinus complaints    overuses afrin   Coronary atherosclerosis of native coronary artery    Dyspnea    Encounter for medical examination to establish care 04/22/2017   Gallstones    Hyperlipidemia    Irritable bowel syndrome 06/07/2017   Ischemic cardiomyopathy    Left atrial enlargement    Long term (current) use of anticoagulants  12/30/2020   Moderate mitral regurgitation    Myocardial infarction (HCC) 09/2015   Persistent atrial fibrillation (HCC)    RBBB    A- Fib   Shortness of breath 12/29/2020   Urinary incontinence 12/30/2020   Current Outpatient Medications on File Prior to Visit  Medication Sig Dispense Refill   amitriptyline  (ELAVIL ) 10 MG tablet Take 1 tablet (10 mg total) by mouth at bedtime. 90 tablet 1   atorvastatin  (LIPITOR) 10 MG tablet Take 1 tablet (10 mg total) by mouth daily. 90 tablet 1   dapagliflozin  propanediol (FARXIGA ) 10 MG TABS tablet Take 1 tablet (10 mg total) by mouth daily before breakfast. 90 tablet 3   metoprolol  succinate (TOPROL -XL) 25 MG 24 hr tablet Take 0.5 tablets (12.5 mg total) by mouth daily. Need yearly appt. 45 tablet 1   sacubitril -valsartan  (ENTRESTO ) 49-51 MG Take 1 tablet by mouth 2 (two) times daily. 180 tablet 1   spironolactone  (ALDACTONE ) 25 MG tablet Take 1 tablet (25 mg total) by mouth daily. 90 tablet 1   warfarin (COUMADIN ) 5 MG tablet Take 1-1.5 tablets (5-7.5 mg total) by mouth daily as directed by coumadin  clinic. 100 tablet 0   colestipol  (COLESTID ) 1 g tablet TAKE 1TAB BY MOUTH 2TIMES DAILY*TAKE THIS MEDICATION AT LEAST 1HR AFTER TAKING YOUR OTHER MEDS. 60 tablet 1   [DISCONTINUED] ipratropium (ATROVENT ) 0.06 % nasal spray Place 2 sprays into both  nostrils 4 (four) times daily. 15 mL 12   No current facility-administered medications on file prior to visit.     The following portions of the patient's history were reviewed and updated as appropriate: allergies, current medications, past family history, past medical history, past social history, past surgical history and problem list.  ROS Otherwise as in subjective above     Objective: BP 122/70   Pulse 100   Temp (!) 96.8 F (36 C)   Wt 158 lb (71.7 kg)   SpO2 (!) 84%   BMI 22.67 kg/m   Wt Readings from Last 3 Encounters:  03/29/23 158 lb (71.7 kg)  02/28/23 155 lb (70.3 kg)  02/08/23  159 lb 8 oz (72.3 kg)    General appearance: alert, no distress, but weak appearing Neck: supple, no lymphadenopathy, no thyromegaly, no masses Heart: initially tachycardiac around 100 then after oxygen applied, seemed to be irregularly irregular, 2/6 brief murmur Lungs: relatively clear, no wheezes, rhonchi, or rales Pulses: 2+ radial pulses, 1+ pedal pulses, normal cap refill Ext: no edema Psych: pleasant, answers questions appropriately    Assessment: Encounter Diagnoses  Name Primary?   SOB (shortness of breath) Yes   Dizziness    Hypoxemia    Disoriented    Weakness      Plan: Given his initial presentation tachycardic with pulse oxygen 84% and looking weak we called EMS for transport.  We placed oxygen on with nasal cannula, 3L/min  He has no recent URI symptoms, no recent exposure to sick contacts, lungs are clear.  I suspect this is a cardiac issue or some other metabolic derangement.  Given his heart history need to be evaluated for cardiac source of his symptoms today.  EMS arrived shortly and will take him to the emergency department  [name] was seen today for shortness of breath.  Diagnoses and all orders for this visit:  SOB (shortness of breath)  Dizziness  Hypoxemia  Disoriented  Weakness    Follow up: called EMS for transport to ED for evaluation

## 2023-03-29 NOTE — Telephone Encounter (Signed)
 Wife called to say patient has been rush to the hospital from his pcp office. Want to make Jason Day aware that he has been peeing blood. Wife is asking that she get a call back. Please advise

## 2023-03-29 NOTE — ED Notes (Signed)
 Patient's wife at bedside arguing with patient over patient wanting to leave AMA.  Patient's wife wants him to stay and patient somewhat agreeable as long as MD comes and talks to him and explains what's going on.  EDP, hospitalist, and cardiology consulting physician made aware.

## 2023-03-30 ENCOUNTER — Inpatient Hospital Stay (HOSPITAL_COMMUNITY): Payer: PPO

## 2023-03-30 DIAGNOSIS — N1831 Chronic kidney disease, stage 3a: Secondary | ICD-10-CM

## 2023-03-30 DIAGNOSIS — E782 Mixed hyperlipidemia: Secondary | ICD-10-CM

## 2023-03-30 DIAGNOSIS — I5021 Acute systolic (congestive) heart failure: Secondary | ICD-10-CM | POA: Diagnosis not present

## 2023-03-30 DIAGNOSIS — I5023 Acute on chronic systolic (congestive) heart failure: Secondary | ICD-10-CM

## 2023-03-30 DIAGNOSIS — I5020 Unspecified systolic (congestive) heart failure: Secondary | ICD-10-CM | POA: Diagnosis not present

## 2023-03-30 DIAGNOSIS — I2510 Atherosclerotic heart disease of native coronary artery without angina pectoris: Secondary | ICD-10-CM

## 2023-03-30 DIAGNOSIS — R159 Full incontinence of feces: Secondary | ICD-10-CM

## 2023-03-30 DIAGNOSIS — I4821 Permanent atrial fibrillation: Secondary | ICD-10-CM | POA: Diagnosis not present

## 2023-03-30 DIAGNOSIS — I2583 Coronary atherosclerosis due to lipid rich plaque: Secondary | ICD-10-CM

## 2023-03-30 DIAGNOSIS — F320 Major depressive disorder, single episode, mild: Secondary | ICD-10-CM

## 2023-03-30 LAB — COMPREHENSIVE METABOLIC PANEL
ALT: 17 U/L (ref 0–44)
AST: 18 U/L (ref 15–41)
Albumin: 3.6 g/dL (ref 3.5–5.0)
Alkaline Phosphatase: 91 U/L (ref 38–126)
Anion gap: 9 (ref 5–15)
BUN: 24 mg/dL — ABNORMAL HIGH (ref 8–23)
CO2: 25 mmol/L (ref 22–32)
Calcium: 8.9 mg/dL (ref 8.9–10.3)
Chloride: 105 mmol/L (ref 98–111)
Creatinine, Ser: 1.63 mg/dL — ABNORMAL HIGH (ref 0.61–1.24)
GFR, Estimated: 42 mL/min — ABNORMAL LOW (ref >60)
Glucose, Bld: 88 mg/dL (ref 70–99)
Potassium: 4.4 mmol/L (ref 3.5–5.1)
Sodium: 139 mmol/L (ref 135–145)
Total Bilirubin: 2.3 mg/dL — ABNORMAL HIGH (ref 0.0–1.2)
Total Protein: 6.3 g/dL — ABNORMAL LOW (ref 6.5–8.1)

## 2023-03-30 LAB — ECHOCARDIOGRAM COMPLETE
Height: 72 in
S' Lateral: 5.5 cm
Weight: 2395.2 [oz_av]

## 2023-03-30 LAB — CBC WITH DIFFERENTIAL/PLATELET
Abs Immature Granulocytes: 0.01 10*3/uL (ref 0.00–0.07)
Basophils Absolute: 0 10*3/uL (ref 0.0–0.1)
Basophils Relative: 0 %
Eosinophils Absolute: 0.1 10*3/uL (ref 0.0–0.5)
Eosinophils Relative: 2 %
HCT: 38.8 % — ABNORMAL LOW (ref 39.0–52.0)
Hemoglobin: 12.8 g/dL — ABNORMAL LOW (ref 13.0–17.0)
Immature Granulocytes: 0 %
Lymphocytes Relative: 28 %
Lymphs Abs: 1.2 10*3/uL (ref 0.7–4.0)
MCH: 30.8 pg (ref 26.0–34.0)
MCHC: 33 g/dL (ref 30.0–36.0)
MCV: 93.3 fL (ref 80.0–100.0)
Monocytes Absolute: 0.4 10*3/uL (ref 0.1–1.0)
Monocytes Relative: 9 %
Neutro Abs: 2.7 10*3/uL (ref 1.7–7.7)
Neutrophils Relative %: 61 %
Platelets: 102 10*3/uL — ABNORMAL LOW (ref 150–400)
RBC: 4.16 MIL/uL — ABNORMAL LOW (ref 4.22–5.81)
RDW: 14.1 % (ref 11.5–15.5)
WBC: 4.5 10*3/uL (ref 4.0–10.5)
nRBC: 0 % (ref 0.0–0.2)

## 2023-03-30 LAB — PROTIME-INR
INR: 5.1 (ref 0.8–1.2)
Prothrombin Time: 47.6 s — ABNORMAL HIGH (ref 11.4–15.2)

## 2023-03-30 LAB — MAGNESIUM: Magnesium: 2.1 mg/dL (ref 1.7–2.4)

## 2023-03-30 LAB — BRAIN NATRIURETIC PEPTIDE: B Natriuretic Peptide: 1485.4 pg/mL — ABNORMAL HIGH (ref 0.0–100.0)

## 2023-03-30 LAB — TROPONIN I (HIGH SENSITIVITY): Troponin I (High Sensitivity): 145 ng/L (ref <18)

## 2023-03-30 MED ORDER — DAPAGLIFLOZIN PROPANEDIOL 10 MG PO TABS
10.0000 mg | ORAL_TABLET | Freq: Every day | ORAL | Status: DC
  Filled 2023-03-30: qty 1

## 2023-03-30 MED ORDER — FUROSEMIDE 10 MG/ML IJ SOLN
40.0000 mg | Freq: Every day | INTRAMUSCULAR | Status: DC
  Administered 2023-03-30: 40 mg via INTRAVENOUS
  Filled 2023-03-30: qty 4

## 2023-03-30 MED ORDER — AMITRIPTYLINE HCL 10 MG PO TABS
10.0000 mg | ORAL_TABLET | Freq: Every day | ORAL | Status: DC
  Administered 2023-03-30: 10 mg via ORAL
  Filled 2023-03-30 (×5): qty 1

## 2023-03-30 NOTE — Assessment & Plan Note (Addendum)
 Telemetry with atrial flutter/ atrial fibrillation, rate 40's bpm with variable block.   Off B blocker  Keep K at 4 and Mg at 2.  Target MAP 60 or greater.  Out of bed to chair as tolerated. PT and OT.    Plan for pacemaker today.   Anticoagulation with warfarin, per pharmacy protocol.  INR today 1,9.  Warfarin has been on hold, plan to resume likely tomorrow, continue close follow up INR.

## 2023-03-30 NOTE — Progress Notes (Signed)
  Echocardiogram 2D Echocardiogram has been performed.  Jason Day 03/30/2023, 10:12 AM

## 2023-03-30 NOTE — Plan of Care (Signed)
   Problem: Education: Goal: Knowledge of General Education information will improve Description: Including pain rating scale, medication(s)/side effects and non-pharmacologic comfort measures Outcome: Progressing   Problem: Clinical Measurements: Goal: Will remain free from infection Outcome: Progressing   Problem: Clinical Measurements: Goal: Diagnostic test results will improve Outcome: Progressing

## 2023-03-30 NOTE — Hospital Course (Addendum)
 Jason Day was admitted to the hospital with the working diagnosis of heart failure decompensation.   83 yo male with the past medical history of heart failure, atrial fibrillation, coronary artery disease, CKD 3a, who presented with dyspnea. Reported 3 days of worsening dyspnea and lower extremity edema. On his initial physical examination his heart rate was 40 to 60, blood pressure 136/72, 99/53, 83/45, HR 38 to 61, 02 saturation 99%, heart with S1 and S2 present, irregularly irregular, lungs with no wheezing or rales with no gallops, abdomen with no distention, positive lower extremity edema.   Na 140, K 4,9 Cl 106, bicarbonate 25, glucose 101, bun 25 cr 1,54  BNP 1,287  High sensitive troponin 94 and 110  Wbc 5,2 hgb 12,9 plt 102  INR 5,7   Urine analysis SG 1,004, negative protein, negative leukocytes and negative hgb.   Chest radiograph with hyperinflation, cardiomegaly, mild hilar vascular congestion, sternotomy wires in place.   EKG 44 bpm, left axis deviation, right bundle branch block, qtc 494, sinus rhythm with 1st degree AV block, positive 2 scape junctional beats, with no significant ST segment or T wave changes.   Patient was place on furosemide  for diuresis. Developed bradycardia.   02/10 persistent bradycardia with rate down to 20 to 30, positive variable block.  EP consulted and plan for pacemaker implantation.  02/11 pacemaker implantation today, plan to observe on telemetry overnight.

## 2023-03-30 NOTE — Assessment & Plan Note (Signed)
 Chronic condition, will do CT abdomen and pelvis with contrast when renal function more stable.

## 2023-03-30 NOTE — Plan of Care (Signed)

## 2023-03-30 NOTE — Progress Notes (Signed)
 Progress Note  Patient Name: Jason Day Date of Encounter: 03/30/2023  Primary Cardiologist: Maude Emmer, MD  Interval Summary   Chart reviewed including cardiology consultation from yesterday.  Discussed situation with the patient and his wife.  His biggest concern is chronic recurring loose stools and weight loss.  Has had functional limitations and shortness of breath over the last several weeks, staying in bed mostly.  No obvious chest pain, no palpitations or syncope.  Vital Signs    Vitals:   03/29/23 2345 03/30/23 0212 03/30/23 0404 03/30/23 0802  BP: (!) 112/50 128/73 124/62 (!) 114/58  Pulse: 62 (!) 55 60 (!) 48  Resp: (!) 22 20  20   Temp:  (!) 97.4 F (36.3 C) 97.6 F (36.4 C) (!) 97.4 F (36.3 C)  TempSrc:  Oral Oral Oral  SpO2: 100% 98% 99% 100%  Weight:  67.9 kg    Height:  6' (1.829 m)      Intake/Output Summary (Last 24 hours) at 03/30/2023 0954 Last data filed at 03/30/2023 9272 Gross per 24 hour  Intake 240 ml  Output 340 ml  Net -100 ml   Filed Weights   03/29/23 1330 03/30/23 0212  Weight: 72 kg 67.9 kg    Physical Exam   GEN: No acute distress.   Neck: No JVD. Cardiac: Irregularly irregular, 2/6 systolic murmur, no gallop.  Respiratory: Nonlabored. Clear to auscultation bilaterally. GI: Soft, nontender, bowel sounds present. MS: No edema. Neuro:  Nonfocal. Psych: Alert and oriented x 3. Normal affect.  ECG/Telemetry    Telemetry reviewed showing slow atrial fibrillation.  Labs    Chemistry Recent Labs  Lab 03/29/23 1349 03/29/23 1413 03/30/23 0222  NA 140 141 139  K 4.9 4.9 4.4  CL 106 105 105  CO2 25  --  25  GLUCOSE 101* 95 88  BUN 25* 28* 24*  CREATININE 1.54* 1.70* 1.63*  CALCIUM  9.0  --  8.9  PROT 6.5  --  6.3*  ALBUMIN  3.7  --  3.6  AST 20  --  18  ALT 20  --  17  ALKPHOS 88  --  91  BILITOT 2.2*  --  2.3*  GFRNONAA 45*  --  42*  ANIONGAP 9  --  9    Hematology Recent Labs  Lab 03/29/23 1349 03/29/23 1413  03/30/23 0222  WBC 5.2  --  4.5  RBC 4.19*  --  4.16*  HGB 12.9* 12.9* 12.8*  HCT 39.0 38.0* 38.8*  MCV 93.1  --  93.3  MCH 30.8  --  30.8  MCHC 33.1  --  33.0  RDW 14.3  --  14.1  PLT 102*  --  102*   Cardiac Enzymes Recent Labs  Lab 03/29/23 1349 03/29/23 1550 03/30/23 0733  TROPONINIHS 94* 110* 145*   Lipid Panel     Component Value Date/Time   CHOL 103 10/12/2021 1027   TRIG 61 10/12/2021 1027   HDL 44 10/12/2021 1027   CHOLHDL 2.3 10/12/2021 1027   CHOLHDL 6 04/22/2017 1431   VLDL 9 10/15/2016 1155   LDLCALC 45 10/12/2021 1027   LDLDIRECT 119.0 04/22/2017 1431   LABVLDL 14 10/12/2021 1027    Cardiac Studies   Follow-up echocardiogram pending.  Assessment & Plan   1.  Acute on chronic HFrEF, LVEF 25 to 30% by echocardiogram in January 2024, follow-up study pending.  BNP 1485.  Started on Lasix  40 mg IV twice daily along with Toprol -XL and Entresto .  Farxiga   stopped with recent diarrhea to see if contributor.  Doubt true weight change of 10 pounds per chart, urine output incompletely recorded.  2.  Multivessel CAD status post CABG in 2018.  No reported angina.  Mildly elevated high-sensitivity troponin I level ranging from 94-145 not clearly suggestive of ACS, more in line with demand ischemia and heart failure.  3.  Paroxysmal atrial fibrillation.  CHA2DS2-VASc score is 5.  He is on Coumadin  typically for stroke prophylaxis, presently held with recent hematuria.  Slow atrial fibrillation at present on Toprol -XL 12.5 mg daily.  4.  Mixed hyperlipidemia.  On Lipitor.  5.  History of tachycardia-bradycardia syndrome.  6.  Mitral regurgitation, moderate by echocardiogram in January 2024.  7.  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  8.  Weight loss and recurring loose stools.  Patient following with Dr. Stacia for GI assessment, had been scheduled undergo abdominal and pelvic CT with contrast this week.  Follow-up on echocardiogram.  At this point cut  Lasix  back to 40 mg IV once daily, stop Toprol -XL completely, continue Entresto .  He is already off Farxiga  and Aldactone  as well.  Discussed with hospitalist team, can get CT abdomen and pelvis with contrast while hospitalized.  For questions or updates, please contact [facility] Please consult [email or web address] for contact info under   Signed, Jayson Sierras, MD  03/30/2023, 9:54 AM

## 2023-03-30 NOTE — Assessment & Plan Note (Signed)
 Continue with statin therapy.  ?

## 2023-03-30 NOTE — Assessment & Plan Note (Addendum)
Follow up renal function in am, his serum cr has been stable over the last 48 hrs.   Plan to continue close monitoring of renal function and electrolytes.  Avoid hypotension and nephrotoxic medications.  Continue to hold on furosemide.

## 2023-03-30 NOTE — Progress Notes (Addendum)
 Progress Note   Patient: Jason Day FMW:[id number] DOB: 21-Mar-1940 DOA: 03/29/2023     1 DOS: the patient was seen and examined on 03/30/2023   Brief hospital course: Jason Day was admitted to the hospital with the working diagnosis of heart failure decompensation.   83 yo male with the past medical history of heart failure, atrial fibrillation, coronary artery disease, CKD 3a, who presented with dyspnea. Reported 3 days of worsening dyspnea and lower extremity edema. On his initial physical examination his heart rate was 40 to 60, blood pressure 136/72, 99/53, 83/45, HR 38 to 61, 02 saturation 99%, heart with S1 and S2 present, irregularly irregular, lungs with no wheezing or rales with no gallops, abdomen with no distention, positive lower extremity edema.   Na 140, K 4,9 Cl 106, bicarbonate 25, glucose 101, bun 25 cr 1,54  BNP 1,287  High sensitive troponin 94 and 110  Wbc 5,2 hgb 12,9 plt 102  INR 5,7   Urine analysis SG 1,004, negative protein, negative leukocytes and negative hgb.   Chest radiograph with hyperinflation, cardiomegaly, mild hilar vascular congestion, sternotomy wires in place.   EKG 44 bpm, left axis deviation, right bundle branch block, qtc 494, sinus rhythm with 1st degree AV block, positive 2 scape junctional beats, with no significant ST segment or T wave changes.   Patient was place on furosemide  for diuresis. Developed bradycardia.     Assessment and Plan: * Acute on chronic systolic (congestive) heart failure (HCC) Echocardiogram from 02/2022 with reduced LV systolic function EF 25 to 30%, global hypokinesis, RV with severe reduction in systolic function, RVSP 33.6, LA with severe dilatation, RA with mild dilatation.   Patient is improving in his symptoms after initial diuresis.   Plan to continue diuresis 40 mg IV daily.  Will hold on Entresto  and spironolactone  to avoid hypotension.  Resume SGLT 2 inh.   Limited medical therapy due to bradycardia and risk  of hypotension.  Follow up new echocardiogram.   Permanent atrial fibrillation (HCC) Telemetry with atrial flutter, rate 33 bpm with scape junctional beats.   Plan to hold on B blocker and continue close telemetry monitoring.  Keep K at 4 and Mg at 2.  Target MAP 60 or greater.  Out of bed to chair as tolerated.  Ambulate with assistance.   Anticoagulation with warfarin, per pharmacy protocol.  Supra-therapeutic INR.   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  Avoid hypotension and nephrotoxic medications.   Hyperlipidemia Continue with statin therapy.   Coronary artery disease S/p CABG in 2018  No chest pain or angina. High sensitive troponin elevation due to heart failure exacerbation.  Ruled out for acute coronary syndrome.   Depression, major, single episode, mild (HCC) Continue with amitriptyline .    Bowel incontinence Chronic condition, will do CT abdomen and pelvis with contrast when renal function more stable.         Subjective: Patient with improvement in his symptoms, but continue to have dyspnea on exertion, his edema has improved. Continue to have bowel incontinence, no diarrhea.   Physical Exam: Vitals:   03/30/23 0404 03/30/23 0802 03/30/23 1121 03/30/23 1212  BP: 124/62 (!) 114/58 (!) 112/45 121/63  Pulse: 60 (!) 48 (!) 31 (!) 32  Resp:  20  20  Temp:  97.6 F (36.4 C) (!) 97.4 F (36.3 C)  98 F (36.7 C)  TempSrc: Oral Oral  Oral  SpO2: 99% 100% 100% 100%  Weight:      Height:       Neurology awake and alert ENT with mild pallor Cardiovascular with S1 and S2 present and regular with extra beats, no gallops or rubs, no murmurs No JVD No lower extremity edema Respiratory with no rales or wheezing, poor inspiratory effort Abdomen with no distention, non tender.  Data  Reviewed:    Family Communication: I spoke with patient's wife at the bedside, we talked in detail about patient's condition, plan of care and prognosis and all questions were addressed.   Disposition: Status is: Inpatient Remains inpatient appropriate because: telemetry monitoring   Planned Discharge Destination: Home     Author: Elidia Toribio Furnace, MD 03/30/2023 12:44 PM  For on call review [email or web address].

## 2023-03-30 NOTE — Assessment & Plan Note (Addendum)
Continue with amitriptyline and venlafaxine.  

## 2023-03-30 NOTE — Assessment & Plan Note (Addendum)
 Echocardiogram with improvement in LV systolic function to 45 to 50%, mild dilated internal LV cavity, chronic basal inferior aneurysmal segment.  Hypokinetic basal inferior lateral segment and mid inferior segment.  Moderate asymmetric left ventricular hypertrophy of the basal segment. RV systolic function low normal, LA with severe dilatation, RA with moderate dilatation, moderate MR, RVSP 35.3, trivial pericardial effusion.    Clinically euvolemic state.  Systolic blood pressure 100 mmHg range   Continue to hold, furosemide , Entresto  and spironolactone  to avoid hypotension.  Continue with SGLT 2 inh.   Limited medical therapy due to bradycardia and risk of hypotension.

## 2023-03-30 NOTE — Progress Notes (Signed)
 CCMD notified CN about patient HR trending in 30s.CN spoke to Arrien, primary RN notified cards. Beta blocker discontinued. Pads not applied. Nursing to continue to monitor for any acute changes.

## 2023-03-31 DIAGNOSIS — I495 Sick sinus syndrome: Secondary | ICD-10-CM

## 2023-03-31 DIAGNOSIS — I4821 Permanent atrial fibrillation: Secondary | ICD-10-CM | POA: Diagnosis not present

## 2023-03-31 DIAGNOSIS — E782 Mixed hyperlipidemia: Secondary | ICD-10-CM | POA: Diagnosis not present

## 2023-03-31 DIAGNOSIS — I4819 Other persistent atrial fibrillation: Secondary | ICD-10-CM | POA: Diagnosis not present

## 2023-03-31 DIAGNOSIS — N1831 Chronic kidney disease, stage 3a: Secondary | ICD-10-CM | POA: Diagnosis not present

## 2023-03-31 DIAGNOSIS — I5023 Acute on chronic systolic (congestive) heart failure: Secondary | ICD-10-CM | POA: Diagnosis not present

## 2023-03-31 DIAGNOSIS — I5020 Unspecified systolic (congestive) heart failure: Secondary | ICD-10-CM | POA: Diagnosis not present

## 2023-03-31 LAB — BASIC METABOLIC PANEL
Anion gap: 10 (ref 5–15)
BUN: 26 mg/dL — ABNORMAL HIGH (ref 8–23)
CO2: 28 mmol/L (ref 22–32)
Calcium: 8.6 mg/dL — ABNORMAL LOW (ref 8.9–10.3)
Chloride: 101 mmol/L (ref 98–111)
Creatinine, Ser: 1.68 mg/dL — ABNORMAL HIGH (ref 0.61–1.24)
GFR, Estimated: 40 mL/min — ABNORMAL LOW (ref >60)
Glucose, Bld: 110 mg/dL — ABNORMAL HIGH (ref 70–99)
Potassium: 3.9 mmol/L (ref 3.5–5.1)
Sodium: 139 mmol/L (ref 135–145)

## 2023-03-31 NOTE — Plan of Care (Signed)
  Problem: Clinical Measurements: Goal: Cardiovascular complication will be avoided Outcome: Progressing   Problem: Activity: Goal: Risk for activity intolerance will decrease Outcome: Progressing   Problem: Nutrition: Goal: Adequate nutrition will be maintained Outcome: Progressing   Problem: Elimination: Goal: Will not experience complications related to bowel motility Outcome: Progressing   Problem: Safety: Goal: Ability to remain free from injury will improve Outcome: Progressing

## 2023-03-31 NOTE — Plan of Care (Signed)
  Problem: Health Behavior/Discharge Planning: Goal: Ability to manage health-related needs will improve Outcome: Progressing   Problem: Clinical Measurements: Goal: Will remain free from infection Outcome: Progressing   Problem: Clinical Measurements: Goal: Diagnostic test results will improve Outcome: Progressing   Problem: Clinical Measurements: Goal: Respiratory complications will improve Outcome: Progressing   Problem: Clinical Measurements: Goal: Cardiovascular complication will be avoided Outcome: Progressing

## 2023-03-31 NOTE — Progress Notes (Signed)
 Progress Note   Patient: Jason Day FMW:[id number] DOB: 11/11/1940 DOA: 03/29/2023     2 DOS: the patient was seen and examined on 03/31/2023   Brief hospital course: Jason Day was admitted to the hospital with the working diagnosis of heart failure decompensation.   83 yo male with the past medical history of heart failure, atrial fibrillation, coronary artery disease, CKD 3a, who presented with dyspnea. Reported 3 days of worsening dyspnea and lower extremity edema. On his initial physical examination his heart rate was 40 to 60, blood pressure 136/72, 99/53, 83/45, HR 38 to 61, 02 saturation 99%, heart with S1 and S2 present, irregularly irregular, lungs with no wheezing or rales with no gallops, abdomen with no distention, positive lower extremity edema.   Na 140, K 4,9 Cl 106, bicarbonate 25, glucose 101, bun 25 cr 1,54  BNP 1,287  High sensitive troponin 94 and 110  Wbc 5,2 hgb 12,9 plt 102  INR 5,7   Urine analysis SG 1,004, negative protein, negative leukocytes and negative hgb.   Chest radiograph with hyperinflation, cardiomegaly, mild hilar vascular congestion, sternotomy wires in place.   EKG 44 bpm, left axis deviation, right bundle branch block, qtc 494, sinus rhythm with 1st degree AV block, positive 2 scape junctional beats, with no significant ST segment or T wave changes.   Patient was place on furosemide  for diuresis. Developed bradycardia.     Assessment and Plan: * Acute on chronic systolic (congestive) heart failure (HCC) Echocardiogram with improvement in LV systolic function to 45 to 50%, mild dilated internal LV cavity, chronic basal inferior aneurysmal segment.  Hypokinetic basal inferior lateral segment and mid inferior segment.  Moderate asymmetric left ventricular hypertrophy of the basal segment. RV systolic function low normal, LA with severe dilatation, RA with moderate dilatation, moderate MR, RVSP 35.3, trivial pericardial effusion.    Patient is improving  in his symptoms after initial diuresis.  Urine output is 2,880 ml Systolic blood pressure 120 mmHg range   Continue to hold on Entresto  and spironolactone  to avoid hypotension.  Continue with SGLT 2 inh.  To consider transition to po loop diuretic.   Limited medical therapy due to bradycardia and risk of hypotension.   Permanent atrial fibrillation (HCC) Telemetry with atrial flutter/ atrial fibrillation, rate 40's bpm with scape junctional beats.   Discontinued B blocker and continue close telemetry monitoring.  Keep K at 4 and Mg at 2.  Target MAP 60 or greater.  Out of bed to chair as tolerated.  Ambulate with assistance.   Anticoagulation with warfarin, per pharmacy protocol.  Supra-therapeutic INR.   Discussed with Jason Day from cardiology will get EP involved due to bradycardia.   CKD stage 3a, GFR 45-59 ml/min (HCC) Blood work pending at this point, will follow up on renal function and electrolytes.   Plan to continue close monitoring of renal function and electrolytes.  Avoid hypotension and nephrotoxic medications.   Hyperlipidemia Continue with statin therapy.   Coronary artery disease S/p CABG in 2018  No chest pain or angina. High sensitive troponin elevation due to heart failure exacerbation.  Ruled out for acute coronary syndrome.   Depression, major, single episode, mild (HCC) Continue with amitriptyline .    Bowel incontinence Chronic condition, will do CT abdomen and pelvis with contrast when renal function more stable.         Subjective: Patient is feeling better, he has been out of bed and ambulating with no dyspnea or chest pain, today he  denies orthopnea or PND.   Physical Exam: Vitals:   03/30/23 2351 03/31/23 0428 03/31/23 0433 03/31/23 0743  BP: (!) 106/45  121/63 (!) 116/43  Pulse: (!) 55  (!) 132 (!) 32  Resp: 18  18 18   Temp: 98.2 F (36.8 C)  (!) 97.4 F (36.3 C) 98.8 F (37.1 C)  TempSrc: Oral  Oral Oral  SpO2: 95%  96%  96%  Weight:  67 kg    Height:       Neurology awake and alert ENT with mild pallor with no icterus Cardiovascular with S1 and S2 present, irregularly irregular with no gallops, rubs or murmurs, bradycardic.  No JVD Respiratory with no rales or wheezing, no rhonchi Abdomen with no distention  No lower extremity edema  Data Reviewed:    Family Communication: no family at the bedside   Disposition: Status is: Inpatient Remains inpatient appropriate because: bradycardia   Planned Discharge Destination: Home      Author: Elidia Toribio Furnace, MD 03/31/2023 11:14 AM  For on call review [email or web address].

## 2023-03-31 NOTE — Progress Notes (Signed)
 CCMD called. Patient had a 3.22 secs sinus arrest. RN assessed patient, he was sleeping comfortably on the bed. MD notified.

## 2023-03-31 NOTE — Progress Notes (Signed)
 Patient refused his morning medicine. He claimed he needs a "wash off" from all his home medicines. He also claimed he will not take an medicine unless Dr. Stann Earnest will talk to him. Day Shift RN made aware.

## 2023-03-31 NOTE — Progress Notes (Signed)
 Progress Note  Patient Name: Jason Day Date of Encounter: 03/31/2023  Primary Cardiologist: Maude Emmer, MD  Interval Summary   Interval chart reviewed.  Remains bradycardic with heart rate at times sustaining in the 30s with atrial fibrillation and junctional escape.  Beta-blocker discontinued yesterday.  Net urine output of approximately 2000 cc last 24 hours.  He and his wife seem frustrated with not getting abdominal and pelvic CT obtained as yet.  I discussed with him his echocardiogram results, also plan to ask EP to see him.  Vital Signs    Vitals:   03/30/23 2351 03/31/23 0428 03/31/23 0433 03/31/23 0743  BP: (!) 106/45  121/63 (!) 116/43  Pulse: (!) 55  (!) 132 (!) 32  Resp: 18  18 18   Temp: 98.2 F (36.8 C)  (!) 97.4 F (36.3 C) 98.8 F (37.1 C)  TempSrc: Oral  Oral Oral  SpO2: 95%  96% 96%  Weight:  67 kg    Height:        Intake/Output Summary (Last 24 hours) at 03/31/2023 1041 Last data filed at 03/31/2023 0617 Gross per 24 hour  Intake 630 ml  Output 2540 ml  Net -1910 ml   Filed Weights   03/29/23 1330 03/30/23 0212 03/31/23 0428  Weight: 72 kg 67.9 kg 67 kg    Physical Exam   GEN: No acute distress.   Neck: No JVD. Cardiac: Irregularly irregular, 2/6 systolic murmur, no gallop.  Respiratory: Nonlabored. Clear to auscultation bilaterally. GI: Soft, nontender, bowel sounds present. MS: No edema. Neuro:  Nonfocal. Psych: Alert and oriented x 3. Normal affect.  ECG/Telemetry    Telemetry reviewed showing slow atrial fibrillation and junctional escape in the 30s.  Labs    Chemistry Recent Labs  Lab 03/29/23 1349 03/29/23 1413 03/30/23 0222  NA 140 141 139  K 4.9 4.9 4.4  CL 106 105 105  CO2 25  --  25  GLUCOSE 101* 95 88  BUN 25* 28* 24*  CREATININE 1.54* 1.70* 1.63*  CALCIUM  9.0  --  8.9  PROT 6.5  --  6.3*  ALBUMIN  3.7  --  3.6  AST 20  --  18  ALT 20  --  17  ALKPHOS 88  --  91  BILITOT 2.2*  --  2.3*  GFRNONAA 45*  --  42*   ANIONGAP 9  --  9    Hematology Recent Labs  Lab 03/29/23 1349 03/29/23 1413 03/30/23 0222  WBC 5.2  --  4.5  RBC 4.19*  --  4.16*  HGB 12.9* 12.9* 12.8*  HCT 39.0 38.0* 38.8*  MCV 93.1  --  93.3  MCH 30.8  --  30.8  MCHC 33.1  --  33.0  RDW 14.3  --  14.1  PLT 102*  --  102*   Cardiac Enzymes Recent Labs  Lab 03/29/23 1349 03/29/23 1550 03/30/23 0733  TROPONINIHS 94* 110* 145*   Lipid Panel     Component Value Date/Time   CHOL 103 10/12/2021 1027   TRIG 61 10/12/2021 1027   HDL 44 10/12/2021 1027   CHOLHDL 2.3 10/12/2021 1027   CHOLHDL 6 04/22/2017 1431   VLDL 9 10/15/2016 1155   LDLCALC 45 10/12/2021 1027   LDLDIRECT 119.0 04/22/2017 1431   LABVLDL 14 10/12/2021 1027    Cardiac Studies   Echocardiogram 03/30/2023:  1. Left ventricular ejection fraction, by estimation, is 45 to 50%. The  left ventricle has mildly decreased function. The left ventricle  demonstrates regional wall motion abnormalities (see scoring  diagram/findings for description). Basal inferior  aneurysmal segment also present on prior study. The left ventricular  internal cavity size was mildly dilated. There is moderate asymmetric left  ventricular hypertrophy of the basal segment. Left ventricular diastolic  parameters are indeterminate.   2. Right ventricular systolic function is low normal. The right  ventricular size is normal. There is normal pulmonary artery systolic  pressure. The estimated right ventricular systolic pressure is 35.3 mmHg.   3. Left atrial size was severely dilated.   4. Right atrial size was moderately dilated.   5. The mitral valve is degenerative. Moderate mitral valve regurgitation.   6. Tricuspid valve regurgitation is mild to moderate.   7. The aortic valve is tricuspid. There is moderate calcification of the  aortic valve. Aortic valve regurgitation is not visualized.   8. Aortic dilatation noted. There is mild dilatation of the ascending  aorta,  measuring 39 mm.   9. The inferior vena cava is normal in size with greater than 50%  respiratory variability, suggesting right atrial pressure of 3 mmHg.   Assessment & Plan   1.  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  Currently on IV Lasix  with net diuresis of approximately 2000 cc last 24 hours.  All other cardiac medications have been held for now in light of bradycardia and current blood pressures.  2.  Multivessel CAD status post CABG in 2018.  No reported angina.  Mildly elevated high-sensitivity troponin I level ranging from 94-145 not clearly suggestive of ACS, more in line with demand ischemia and heart failure.  3.  Paroxysmal atrial fibrillation.  CHA2DS2-VASc score is 5.  He is on Coumadin  typically for stroke prophylaxis, presently held with recent hematuria.  4.  Mixed hyperlipidemia.  On Lipitor.  5.  History of tachycardia-bradycardia syndrome.  Recently bradycardic in atrial fibrillation, Toprol -XL has been discontinued.  6.  Mitral regurgitation, moderate by follow-up echocardiogram.  7.  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  8.  Weight loss and recurring loose stools.  Patient following with Dr. Stacia for GI assessment, had been scheduled undergo abdominal and pelvic CT with contrast this week.  Decrease Lasix  to 40 mg IV once daily.  All other cardiac medications have been held at this point.  Will ask EP to evaluate in light of persisting bradycardia in the absence of Toprol -XL, he has seen Dr. Cindie previously.  Hopefully can get abdominal and pelvic CTA during this hospital stay depending on renal function.  For questions or updates, please contact [facility] Please consult [email or web address] for contact info under   Signed, Jayson Sierras, MD  03/31/2023, 10:41 AM

## 2023-04-01 DIAGNOSIS — E782 Mixed hyperlipidemia: Secondary | ICD-10-CM | POA: Diagnosis not present

## 2023-04-01 DIAGNOSIS — I442 Atrioventricular block, complete: Secondary | ICD-10-CM

## 2023-04-01 DIAGNOSIS — I4821 Permanent atrial fibrillation: Secondary | ICD-10-CM | POA: Diagnosis not present

## 2023-04-01 DIAGNOSIS — R001 Bradycardia, unspecified: Secondary | ICD-10-CM | POA: Diagnosis not present

## 2023-04-01 DIAGNOSIS — I4891 Unspecified atrial fibrillation: Secondary | ICD-10-CM

## 2023-04-01 DIAGNOSIS — I5023 Acute on chronic systolic (congestive) heart failure: Secondary | ICD-10-CM | POA: Diagnosis not present

## 2023-04-01 DIAGNOSIS — N1831 Chronic kidney disease, stage 3a: Secondary | ICD-10-CM | POA: Diagnosis not present

## 2023-04-01 LAB — CBC
HCT: 41.5 % (ref 39.0–52.0)
Hemoglobin: 14.1 g/dL (ref 13.0–17.0)
MCH: 31.1 pg (ref 26.0–34.0)
MCHC: 34 g/dL (ref 30.0–36.0)
MCV: 91.6 fL (ref 80.0–100.0)
Platelets: 138 10*3/uL — ABNORMAL LOW (ref 150–400)
RBC: 4.53 MIL/uL (ref 4.22–5.81)
RDW: 14.1 % (ref 11.5–15.5)
WBC: 6.1 10*3/uL (ref 4.0–10.5)
nRBC: 0 % (ref 0.0–0.2)

## 2023-04-01 LAB — BASIC METABOLIC PANEL
Anion gap: 11 (ref 5–15)
BUN: 30 mg/dL — ABNORMAL HIGH (ref 8–23)
CO2: 27 mmol/L (ref 22–32)
Calcium: 8.8 mg/dL — ABNORMAL LOW (ref 8.9–10.3)
Chloride: 102 mmol/L (ref 98–111)
Creatinine, Ser: 1.68 mg/dL — ABNORMAL HIGH (ref 0.61–1.24)
GFR, Estimated: 40 mL/min — ABNORMAL LOW (ref >60)
Glucose, Bld: 88 mg/dL (ref 70–99)
Potassium: 4.4 mmol/L (ref 3.5–5.1)
Sodium: 140 mmol/L (ref 135–145)

## 2023-04-01 LAB — PROTIME-INR
INR: 2.1 — ABNORMAL HIGH (ref 0.8–1.2)
Prothrombin Time: 23.4 s — ABNORMAL HIGH (ref 11.4–15.2)

## 2023-04-01 LAB — MAGNESIUM: Magnesium: 2 mg/dL (ref 1.7–2.4)

## 2023-04-01 LAB — SURGICAL PCR SCREEN
MRSA, PCR: NEGATIVE
Staphylococcus aureus: NEGATIVE

## 2023-04-01 MED ORDER — SODIUM CHLORIDE 0.9% FLUSH
3.0000 mL | Freq: Two times a day (BID) | INTRAVENOUS | Status: DC
  Administered 2023-04-01 – 2023-04-02 (×2): 3 mL via INTRAVENOUS

## 2023-04-01 NOTE — Progress Notes (Signed)
 Nurse requested Mobility Specialist to perform oxygen saturation test with pt which includes removing pt from oxygen both at rest and while ambulating.  Below are the results from that testing.     Patient Saturations on Room Air at Rest = spO2 99%  Patient Saturations on Room Air while Ambulating = sp02 99% .   At end of testing pt left in room on RA.  Reported results to nurse.   Oneda Big Mobility Specialist Please contact via SecureChat or  Rehab office at [phone]

## 2023-04-01 NOTE — Progress Notes (Addendum)
 Progress Note   Patient: Jason Day QIH:[id number] DOB: [birth year] DOA: 03/29/2023     3 DOS: the patient was seen and examined on 04/01/2023   Brief hospital course: Jason Day was admitted to the hospital with the working diagnosis of heart failure decompensation.   83 yo male with the past medical history of heart failure, atrial fibrillation, coronary artery disease, CKD 3a, who presented with dyspnea. Reported 3 days of worsening dyspnea and lower extremity edema. On his initial physical examination his heart rate was 40 to 60, blood pressure 136/72, 99/53, 83/45, HR 38 to 61, 02 saturation 99%, heart with S1 and S2 present, irregularly irregular, lungs with no wheezing or rales with no gallops, abdomen with no distention, positive lower extremity edema.   Na 140, K 4,9 Cl 106, bicarbonate 25, glucose 101, bun 25 cr 1,54  BNP 1,287  High sensitive troponin 94 and 110  Wbc 5,2 hgb 12,9 plt 102  INR 5,7   Urine analysis SG 1,004, negative protein, negative leukocytes and negative hgb.   Chest radiograph with hyperinflation, cardiomegaly, mild hilar vascular congestion, sternotomy wires in place.   EKG 44 bpm, left axis deviation, right bundle branch block, qtc 494, sinus rhythm with 1st degree AV block, positive 2 scape junctional beats, with no significant ST segment or T wave changes.   Patient was place on furosemide  for diuresis. Developed bradycardia.   02/10 persistent bradycardia with rate down to 20 to 30, positive variable block.  EP consulted and plan for pacemaker implantation.    Assessment and Plan: * Acute on chronic systolic (congestive) heart failure (HCC) Echocardiogram with improvement in LV systolic function to 45 to 50%, mild dilated internal LV cavity, chronic basal inferior aneurysmal segment.  Hypokinetic basal inferior lateral segment and mid inferior segment.  Moderate asymmetric left ventricular hypertrophy of the basal segment. RV systolic function low normal,  LA with severe dilatation, RA with moderate dilatation, moderate MR, RVSP 35.3, trivial pericardial effusion.    Clinically euvolemic state.  Systolic blood pressure 120 mmHg range   Continue to hold, furosemide , Entresto  and spironolactone  to avoid hypotension.  Continue with SGLT 2 inh.   Limited medical therapy due to bradycardia and risk of hypotension.   Permanent atrial fibrillation (HCC) Telemetry with atrial flutter/ atrial fibrillation, rate 40's bpm with variable block.   Off B blocker  Keep K at 4 and Mg at 2.  Target MAP 60 or greater.  Out of bed to chair as tolerated. PT and OT.    Consulted EP and decision made for pacemaker implantation.   Anticoagulation with warfarin, per pharmacy protocol.  INR today 2,0. Hold warfarin today in preparation for pacemaker implantation.   CKD stage 3a, GFR 45-59 ml/min (HCC) Renal function stable with serum cr at 1,68 with K at 4,4 and serum bicarbonate at 27  Na 140 and Mg 2,0   Plan to continue close monitoring of renal function and electrolytes.  Avoid hypotension and nephrotoxic medications.  Continue to hold on furosemide .   Hyperlipidemia Continue with statin therapy.   Coronary artery disease S/p CABG in 2018  No chest pain or angina. High sensitive troponin elevation due to heart failure exacerbation.  Ruled out for acute coronary syndrome.   Depression, major, single episode, mild (HCC) Continue with amitriptyline .    Bowel incontinence Chronic condition, will do CT abdomen and pelvis with contrast when renal function more stable.    Subjective: Patient with no chest pain or dyspnea, no PND  or orthopnea, he has been bradycardic more at night.   Physical Exam: Vitals:   04/01/23 0409 04/01/23 0727 04/01/23 0946 04/01/23 1111  BP: (!) 121/59 (!) 124/37 (!) 99/44 (!) 133/52  Pulse: (!) 41 (!) 32 (!) 36 (!) 33  Resp: 18  18 18   Temp: (!) 97.4 F (36.3 C) 97.6 F (36.4 C) 97.6 F (36.4 C)   TempSrc: Oral  Oral Oral   SpO2: 98% 98% 100% 100%  Weight: 67.5 kg     Height:       Neurology awake and alert ENT with mild pallor Cardiovascular with S1 and S2 present, regular with extra beats, bradycardic, no murmurs or rubs No JVD No lower extremity edema Respiratory with no rales or wheezing, no rhonchi Abdomen with no distention  Data Reviewed:    Family Communication: no family at the bedside   Disposition: Status is: Inpatient Remains inpatient appropriate because: Pacemaker implantation in am.   Planned Discharge Destination: Home      Author: Albertus Alt, MD 04/01/2023 1:51 PM  For on call review [email or web address].

## 2023-04-01 NOTE — Consult Note (Addendum)
 Cardiology Consultation   Patient ID: Jason Day MRN: [id number]; DOB: April 22, 1940  Admit date: 03/29/2023 Date of Consult: 04/01/2023  PCP:  Claudene Crystal, PA-C   [facility] Providers Cardiologist:  Janelle Mediate, MD \ EP: Dr. Marven Slimmer AHF: Dr. Mitzie Anda  Patient Profile:   Jason Day is a 83 y.o. male with a hx of CAD (CABG 2018), ICM, chronic CHF,  HLD, RBBB, who is being seen 04/01/2023 for the evaluation of bradycardia at the request of Dr. Londa Rival.  History of Present Illness:   Mr. Fickle saw Dr. Marven Slimmer Oct 2022 (having seen dr. Nunzio Belch June 2021. At that time, ICD was not thought to be a good option given his history and advanced age and was also not desired by the patient his family) Revisited Narrow RBBB > not likely to respond to CRT Advanced age, ICD not indicated for primary prevention  was referred to AHF team last year with persistent reducede LVEF and RV failure as well. AFib was described as permanent (having failed amiodarone ) Dec 2023 pres-syncope prompted monitoring 29 - 100 bpm, 46 pauses with the longest 4 seconds (at night). Also 28 NSVT runs, longest 18 beats  At the time of his visit with Dr. Mitzie Anda Feb 2024, her reported feeling well, without exertional intolerances. Not felt CRT candidate with RBBB > though with some degree of bradycardia, and pre-syncope, perhaps PPM  and referred back to EP  He saw Dr. Marven Slimmer 11/2022,  January 2024 ZIO monitor personally reviewed. Reviewed in person with the patient and his family. Pauses are predominantly nocturnal. Good heart rate excursion.  March 08, 2022 echo EF 25 to 30% RV severely reduced Severely dilated left atrium Moderate MR Discussed since his HF clinic visit meds had been adjusted without further symptoms Discussed role of PPM, and at the time, planned for conservative management  ADMITTED 03/29/23 Sent via his PMD by EMS 2/2 bradycardia and reports of hematuria Pt described as a poor  historian, initially pt wanted to leave AMA, wife convinced him to stay Pending CT abd for hematuria Wife reported progressive fatigue, sleepit 15-18 hours, getting winded with ADLs Struggles w/chronic GI upset and diarrhea  INR was 5,7 Bradycardic, notes report 40's Cardiology consulted for bradycardia and acute/chronic CHF HRs felt not too far if not at his baseline Volume OL Home low dose BB continued Farxiga  stopped in question of perhaps contributing to his diarrhea Failure to thrive ?   03/30/23: Toprol  stopped Lasix  reduced 03/31/23: report slow AFib rates junctional rates 30's Still no CT abdomen, pt/wife frustrated over that LVEF 45-60%, + WMA, RVEF low normal, mod MR, mild-mod TR Diuresing well with IV lasix  > otherwise HF meds held for BP, disrrhea, bradycardia planned for EP consult  Last dose of Toprol  12.6mg  was prior to admission, none here 2/8-today) No other potential nodal blockers here Amitriptyline  carries some risk of bradycardia  LABS K+ 4.4 Mag 2.0 BUN/Creat 30/1.68 WBC 6.1 H/H 14/41 Plts 138  INR today 2.1 (from 5.1)  HS Trops were 110 > 145 BNP 1485  The patient reports that he thinks the team ahd blown his slow HRs out of proportion, he denies syncope, near syncope. He will infrequently get a little dizzy. No CP He went to his PMD for progressive SOB to get Oxygen ordered for him and was sent in His primary concern is 5 years of life altering GI symptoms Significant gas and liquid stools "Still waiting for the cat scan"  He reports walking  today with therapy, felt great, minimal if at all SOB, up and down the hall, no dizziness, lightheadedness He would agree to a pacer, but doesn't think he needs one  Past Medical History:  Diagnosis Date   Acute combined systolic and diastolic congestive heart failure (HCC) 10/16/2016   Allergic rhinitis 09/01/2020   Anal fissure    Angio-edema    Bleeding hemorrhoids    Chronic combined systolic and  diastolic heart failure (HCC)    Chronic sinus complaints    overuses afrin   Coronary atherosclerosis of native coronary artery    Dyspnea    Encounter for medical examination to establish care 04/22/2017   Gallstones    Hyperlipidemia    Irritable bowel syndrome 06/07/2017   Ischemic cardiomyopathy    Left atrial enlargement    Long term (current) use of anticoagulants 12/30/2020   Moderate mitral regurgitation    Myocardial infarction (HCC) 09/2015   Persistent atrial fibrillation (HCC)    RBBB    A- Fib   Shortness of breath 12/29/2020   Urinary incontinence 12/30/2020    Past Surgical History:  Procedure Laterality Date   BACK SURGERY  ~2014   disc repair   CARDIOVERSION N/A 10/24/2018   Procedure: CARDIOVERSION;  Surgeon: Hugh Madura, MD;  Location: [facility];  Service: Cardiovascular;  Laterality: N/A;   CHOLECYSTECTOMY     COLONOSCOPY  04/06/2014   Hemorrhoids and diverticulosis performed in [place]    COLONOSCOPY WITH PROPOFOL  N/A 08/04/2019   Procedure: COLONOSCOPY WITH PROPOFOL ;  Surgeon: Celedonio Coil, MD;  Location: [facility];  Service: Endoscopy;  Laterality: N/A;   CORONARY ARTERY BYPASS GRAFT N/A 10/18/2016   Procedure: CORONARY ARTERY BYPASS GRAFTING (CABG) x five , using left internal mammary artery and right leg greater saphenous vein harvested endoscopically;  Surgeon: Bartley Lightning, MD;  Location: [facility] OR;  Service: Open Heart Surgery;  Laterality: N/A;   ESOPHAGOGASTRODUODENOSCOPY (EGD) WITH PROPOFOL  N/A 08/04/2019   Procedure: ESOPHAGOGASTRODUODENOSCOPY (EGD) WITH PROPOFOL ;  Surgeon: Celedonio Coil, MD;  Location: [facility];  Service: Endoscopy;  Laterality: N/A;   FOOT SURGERY     HEMORRHOID BANDING     HEMORRHOID SURGERY     REVERSE SHOULDER ARTHROPLASTY Left 08/21/2018   Procedure: REVERSE SHOULDER ARTHROPLASTY;  Surgeon: Ellard Gunning, MD;  Location: [facility];  Service: Orthopedics;  Laterality: Left;   RIGHT/LEFT HEART CATH AND CORONARY  ANGIOGRAPHY N/A 10/16/2016   Procedure: RIGHT/LEFT HEART CATH AND CORONARY ANGIOGRAPHY;  Surgeon: Arty Binning, MD;  Location: [facility];  Service: Cardiovascular;  Laterality: N/A;   stents in liver     TEE WITHOUT CARDIOVERSION N/A 10/18/2016   Procedure: TRANSESOPHAGEAL ECHOCARDIOGRAM (TEE);  Surgeon: Bartley Lightning, MD;  Location: [facility] OR;  Service: Open Heart Surgery;  Laterality: N/A;   TRANSURETHRAL RESECTION OF PROSTATE N/A 11/19/2019   Procedure: CYSTOSCOPY TRANSURETHRAL RESECTION OF THE PROSTATE (TURP);  Surgeon: Homero Luster, MD;  Location: [facility];  Service: Urology;  Laterality: N/A;     Home Medications:  Prior to Admission medications   Medication Sig Start Date End Date Taking? Authorizing Provider  amitriptyline  (ELAVIL ) 10 MG tablet Take 1 tablet (10 mg total) by mouth at bedtime. 02/01/23  Yes Elois Hair, MD  atorvastatin  (LIPITOR) 10 MG tablet Take 1 tablet (10 mg total) by mouth daily. 10/11/22  Yes Loyde Rule, MD  colestipol  (COLESTID ) 1 g tablet TAKE 1TAB BY MOUTH 2TIMES DAILY*TAKE THIS MEDICATION AT LEAST 1HR AFTER TAKING YOUR  OTHER MEDS. Patient taking differently: Take 0.5 g by mouth daily. 12/28/22  Yes Elois Hair, MD  dapagliflozin  propanediol (FARXIGA ) 10 MG TABS tablet Take 1 tablet (10 mg total) by mouth daily before breakfast. 01/31/23  Yes Darlis Eisenmenger, MD  sacubitril -valsartan  (ENTRESTO ) 49-51 MG Take 1 tablet by mouth 2 (two) times daily. 10/11/22  Yes Nishan, Peter C, MD  Simethicone (GAS-X PO) Take 1 tablet by mouth daily as needed.   Yes [provider]  spironolactone  (ALDACTONE ) 25 MG tablet Take 1 tablet (25 mg total) by mouth daily. 10/11/22  Yes Nishan, Peter C, MD  warfarin (COUMADIN ) 5 MG tablet Take 1-1.5 tablets (5-7.5 mg total) by mouth daily as directed by coumadin  clinic. Patient taking differently: Take 5-7.5 mg by mouth at bedtime. 03/19/23  Yes Nishan, Peter C, MD  ipratropium (ATROVENT ) 0.06 % nasal spray  Place 2 sprays into both nostrils 4 (four) times daily. 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  Tobacco Use   Smoking status: Former    Current packs/day: 0.00    Types: Cigarettes    Quit date: 1970    Years since quitting: 55.1    Passive exposure: Never   Smokeless tobacco: Never   Tobacco comments:    Quit in 1970  Vaping Use   Vaping status: Never Used  Substance and Sexual Activity   Alcohol  use: No   Drug use: No   Sexual activity: Yes  Other Topics Concern   Not on file  Social History Narrative   Married    Originally from [place]  moved to [place]  age 23 moved to [facility] 2018   1 son Nicholas Resendes in [place]   [employer]tr [employer] [place]       Has living will   Wife is health care POA--then son   Would accept resuscitation   No tube feeds if cognitively unaware   Social Drivers of Health   Financial Resource Strain: Low Risk  (12/04/2022)   Overall Financial Resource Strain (CARDIA)    Difficulty of Paying Living Expenses: Not hard at all  Food Insecurity: No Food Insecurity (03/30/2023)   Hunger Vital Sign    Worried About Running Out of Food in the Last Year: Never true    Ran Out of Food in the Last Year: Never true  Transportation Needs: No Transportation Needs (03/30/2023)   PRAPARE - Administrator, Civil Service (Medical): No    Lack of Transportation  (Non-Medical): No  Physical Activity: Insufficiently Active (12/04/2022)   Exercise Vital Sign    Days of Exercise per Week: 3 days    Minutes of Exercise per Session: 30 min  Stress: No Stress Concern Present (12/04/2022)   [employer] of Occupational Health - Occupational Stress Questionnaire    Feeling of Stress : Not at all  Social Connections: Moderately  Isolated (03/30/2023)   Social Connection and Isolation Panel [NHANES]    Frequency of Communication with Friends and Family: Never    Frequency of Social Gatherings with Friends and Family: Twice a week    Attends Religious Services: More than 4 times per year    Active Member of [employer] or Organizations: No    Attends Banker Meetings: Never    Marital Status: Married  Catering manager Violence: Not At Risk (03/30/2023)   Humiliation, Afraid, Rape, and Kick questionnaire    Fear of Current or Ex-Partner: No    Emotionally Abused: No    Physically Abused: No    Sexually Abused: No    Family History:   Family History  Problem Relation Age of Onset   COPD Father    Emphysema Father    Healthy Brother    Diabetes Neg Hx    Cancer Neg Hx    Stomach cancer Neg Hx    Colon cancer Neg Hx    Allergic rhinitis Neg Hx    Angioedema Neg Hx    Asthma Neg Hx    Eczema Neg Hx    Urticaria Neg Hx      ROS:  Please see the history of present illness.  All other ROS reviewed and negative.     Physical Exam/Data:   Vitals:   03/31/23 1932 04/01/23 0033 04/01/23 0409 04/01/23 0727  BP: (!) 106/49 (!) 110/58 (!) 121/59   Pulse: (!) 31 (!) 28 (!) 41   Resp: 18  18   Temp: 97.6 F (36.4 C)  (!) 97.4 F (36.3 C) 97.6 F (36.4 C)  TempSrc: Oral  Oral Oral  SpO2: 96% 100% 98%   Weight:   67.5 kg   Height:        Intake/Output Summary (Last 24 hours) at 04/01/2023 0906 Last data filed at 04/01/2023 5409 Gross per 24 hour  Intake 1077 ml  Output 500 ml  Net 577 ml      04/01/2023    4:09 AM 03/31/2023    4:28  AM 03/30/2023    2:12 AM  Last 3 Weights  Weight (lbs) 148 lb 14.4 oz 147 lb 12.8 oz 149 lb 11.2 oz  Weight (kg) 67.541 kg 67.042 kg 67.903 kg     Body mass index is 20.19 kg/m.  General:  Well nourished, well developed, in no acute distress HEENT: normal Neck: no JVD Vascular: No carotid bruits; Distal pulses 2+ bilaterally Cardiac:  RRR; no murmurs, gallops or rubs Lungs:  CTA b/l, no wheezing, rhonchi or rales  Abd: soft, nontender Ext: no edema Musculoskeletal:  No deformities, thin Skin: warm and dry  Neuro:  no gross focal abnormalities noted Psych:  Normal affect   EKG:  The EKG was personally reviewed and demonstrates:    Artofact/baseline movement, 44bpm, likely AFib, RBBB AFib 44bpm, RBBB  OLD AFib 55bpm, RBBB, PVC  Telemetry:  Telemetry was personally reviewed and demonstrates:   AFib 30-50's Appears to have 2-3 different morphologies (assuming stable lead placement) One likely an excape at about 30-33bpm, wider, others are irregular Today with ambulating HR rose 40's-50 range  One NSVT (monomorphic) (known for him by prior maonitor)  Relevant CV Studies:  Echocardiogram 03/30/2023:  1. Left ventricular ejection fraction, by estimation, is 45 to 50%. The  left ventricle has mildly decreased function. The left ventricle  demonstrates regional wall motion abnormalities (see scoring  diagram/findings for description). Basal inferior  aneurysmal segment also present on  prior study. The left ventricular  internal cavity size was mildly dilated. There is moderate asymmetric left  ventricular hypertrophy of the basal segment. Left ventricular diastolic  parameters are indeterminate.   2. Right ventricular systolic function is low normal. The right  ventricular size is normal. There is normal pulmonary artery systolic  pressure. The estimated right ventricular systolic pressure is 35.3 mmHg.   3. Left atrial size was severely dilated.   4. Right atrial size was  moderately dilated.   5. The mitral valve is degenerative. Moderate mitral valve regurgitation.   6. Tricuspid valve regurgitation is mild to moderate.   7. The aortic valve is tricuspid. There is moderate calcification of the  aortic valve. Aortic valve regurgitation is not visualized.   8. Aortic dilatation noted. There is mild dilatation of the ascending  aorta, measuring 39 mm.   9. The inferior vena cava is normal in size with greater than 50%  respiratory variability, suggesting right atrial pressure of 3 mmHg.    - Echo (1/19): EF 35% - Echo (2020): EF 30-35% - Echo (8/22): EF 30-35%, moderate-severe MR - Echo (1/24): EF 25-30%, moderate LV dilation, severely decreased RV systolic function, severe LAE, moderate MR.  4. 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  Moderate-severe on 8/22 echo but moderate on 1/24 echo.   Laboratory Data:  High Sensitivity Troponin:   Recent Labs  Lab 03/29/23 1349 03/29/23 1550 03/30/23 0733  TROPONINIHS 94* 110* 145*     Chemistry Recent Labs  Lab 03/29/23 1349 03/29/23 1413 03/30/23 0222 03/31/23 1308 04/01/23 0235  NA 140   < > 139 139 140  K 4.9   < > 4.4 3.9 4.4  CL 106   < > 105 101 102  CO2 25  --  25 28 27   GLUCOSE 101*   < > 88 110* 88  BUN 25*   < > 24* 26* 30*  CREATININE 1.54*   < > 1.63* 1.68* 1.68*  CALCIUM  9.0  --  8.9 8.6* 8.8*  MG 2.2  --  2.1  --  2.0  GFRNONAA 45*  --  42* 40* 40*  ANIONGAP 9  --  9 10 11    < > = values in this interval not displayed.    Recent Labs  Lab 03/29/23 1349 03/30/23 0222  PROT 6.5 6.3*  ALBUMIN  3.7 3.6  AST 20 18  ALT 20 17  ALKPHOS 88 91  BILITOT 2.2* 2.3*   Lipids No results for input(s): "CHOL", "TRIG", "HDL", "LABVLDL", "LDLCALC", "CHOLHDL" in the last 168 hours.  Hematology Recent Labs  Lab 03/29/23 1349 03/29/23 1413 03/30/23 0222 04/01/23 0801  WBC 5.2  --  4.5 6.1  RBC 4.19*  --  4.16* 4.53  HGB 12.9* 12.9* 12.8* 14.1  HCT 39.0 38.0* 38.8* 41.5  MCV  93.1  --  93.3 91.6  MCH 30.8  --  30.8 31.1  MCHC 33.1  --  33.0 34.0  RDW 14.3  --  14.1 14.1  PLT 102*  --  102* 138*   Thyroid  No results for input(s): "TSH", "FREET4" in the last 168 hours.  BNP Recent Labs  Lab 03/29/23 1350 03/30/23 0222  BNP 1,287.3* 1,485.4*    DDimer No results for input(s): "DDIMER" in the last 168 hours.   Radiology/Studies:    DG Chest Portable 1 View Result Date: 03/29/2023 CLINICAL DATA:  Dyspnea EXAM: PORTABLE CHEST 1 VIEW COMPARISON:  Chest radiograph dated 02/11/2022 FINDINGS: Normal lung volumes. Mild bilateral  interstitial patchy opacities. No pleural effusion or pneumothorax. Similar enlarged cardiomediastinal silhouette. Left shoulder arthroplasty. Median sternotomy wires are nondisplaced. IMPRESSION: 1. Mild bilateral interstitial and patchy opacities, which may represent pulmonary edema. 2. Similar cardiomegaly. Electronically Signed   By: Limin  Xu M.D.   On: 03/29/2023 15:25     Assessment and Plan:   AFib w/SVR CHA2DS2Vasc is 4, on warfarin > tox on admission (pt denies hematuria, UA fairly unremarkable) Home BB held, none given here  Uncertain what if any symptoms 2/2 brady He was having SOB that he says is better (s/p diuresis) Denies any near syncope or syncope  He certainly is bradycardia While resting in bed, awake talking to me HR 33-35 Walking in the room, a bit gets his HR 44, denies symptoms Seems to have variable BBB though making this picture  more worrisome and suspect he is having CHB at least intermittently  Will review with Dr. Marven Slimmer Pt not inclined to want a pacer, though if Dr. Marven Slimmer felt needed he would agree  He very much feels his only issue is his GI problem  ADDEND: Dr.Lambert has seen the patient Recommends PPM and discussed rational with the patient Patient is agreeable NPO after MN for tentative procedure tomorrow Please do not allow INR to rise, hold warfarin tonight    Risk Assessment/Risk  Scores:    For questions or updates, please contact [facility] Please consult [email or web address] for contact info under    Signed, Debbie Fails, PA-C  04/01/2023 9:06 AM

## 2023-04-01 NOTE — Progress Notes (Signed)
 Patient Name: Jason Day Date of Encounter: 04/01/2023  [facility] Cardiologist: Janelle Mediate, MD   Interval Summary  .    Patient resting in the bed comfortably. Wife not in the room His HR while in bed and talking only gets up to 38 but he denies any symptoms of bradycardia He states he has been up and walking around with OT without any issues, confirmed in OT note He is still focused on his CT scan for his abdomen  He also states that he is only wanting to hear what Dr. Marven Slimmer has to say  When asked why he has been refusing his cholesterol medications he states that he will only take them once Dr. Marven Slimmer says he should   Vital Signs .    Vitals:   04/01/23 0033 04/01/23 0409 04/01/23 0727 04/01/23 0946  BP: (!) 110/58 (!) 121/59  (!) 99/44  Pulse: (!) 28 (!) 41  (!) 36  Resp:  18  18  Temp:  (!) 97.4 F (36.3 C) 97.6 F (36.4 C) 97.6 F (36.4 C)  TempSrc:  Oral Oral Oral  SpO2: 100% 98%  100%  Weight:  67.5 kg    Height:       Intake/Output Summary (Last 24 hours) at 04/01/2023 1053 Last data filed at 04/01/2023 0838 Gross per 24 hour  Intake 1077 ml  Output 500 ml  Net 577 ml      04/01/2023    4:09 AM 03/31/2023    4:28 AM 03/30/2023    2:12 AM  Last 3 Weights  Weight (lbs) 148 lb 14.4 oz 147 lb 12.8 oz 149 lb 11.2 oz  Weight (kg) 67.541 kg 67.042 kg 67.903 kg     Telemetry/ECG    Atrial fibrillation with slow ventricular response, HR 30-50s - Personally Reviewed  Physical Exam .   GEN: No acute distress.   Neck: No JVD Cardiac: bradycardic, irregular rhythm, no murmurs.  Respiratory: Clear to auscultation bilaterally. GI: Soft, nontender, non-distended  MS: No edema  Assessment & Plan .     Acute on chronic combined systolic and diastolic heart failure Echo this admission showed showed EF 45-50%, improved from 25-30% PTA was stable on GDMT (Farxiga , Toprol , Entresto , spironolactone ) Patient has previously declined ICD/CRT-D Unable to  evaluate with cardiac MR due to claustrophobia  CXR showed likely pulmonary edema and cardiomegaly BNP 1,485 Net - 1.4 L this admission Weight 148 lb today, stable when compared to last two days    Last dose of IV Lasix  40 mg was given 03/30/23 AM GDMT held due to bradycardia (30-40s)   CAD s/p CABG x5 2018 Elevated troponin History of CABG 09/2016; LIMA to LAD, SVG to OM 1, SVG to OM2 and OM3, and SVG to D2 94 > 110 > 145 likely secondary to demand ischemia/heart failure exacerbation  Denies any chest pain Continue Lipitor 10 mg daily, patient has been refusing this medication and states he will not take it until his heart doctor tells him to   Chronic atrial fibrillation On warfarin therapy History of tachybrady syndrome  Bradycardia  Failed amiodarone   Ablation deferred and patient was primarily rate controlled  Warfarin for anticoagulation due to cost Presented to ED with hematuria and INR 5.7. INR today 2.1 Of note, there was no indication for pacing per Dr. Lucas Rushing monitor (Jan 2024) showed predominately nocturnal pauses with good HR excursion  CHA2DS2-VASc score is 5  HR 30-40s EP consulted in setting of bradycardia, appreciate their  recs Held cardiac meds due to bradycardia and hypotension  Warfarin to be dosed/continued per pharmacy protocol    Mitral regurgitation Echo this admission showed stable, moderate MR Continue to follow clinically    Per primary Anemia, resolved Abdominal pain Diarrhea, IBS   For questions or updates, please contact [facility] Please consult [email or web address] for contact info under       Signed, Jiles Mote, PA-C

## 2023-04-01 NOTE — Progress Notes (Signed)
 Heart Failure Navigator Progress Note  Assessed for Heart & Vascular TOC clinic readiness.  Patient does not meet criteria due to Advanced Heart Failure Team patient of Dr. Shirlee Latch.   Navigator will sign off at this time.    Rhae Hammock, BSN, Scientist, clinical (histocompatibility and immunogenetics) Only

## 2023-04-01 NOTE — Evaluation (Signed)
 Occupational Therapy Evaluation/Discharge Patient Details Name: Jason Day MRN: [id number] DOB: September 14, 1940 Today's Date: 04/01/2023   History of Present Illness Pt is an 83 y/o male presenting with SOB and lower extremity edema in setting of acute CHF exacerbation. Pt also with mildly supratherapeutic INR and elevated troponin. PMH: CHF, a fib, CAD s/p CABG 2018, HLD, CKD3   Clinical Impression   PTA, pt lives with spouse, typically completely independent and active at baseline without need for AD. Pt presents now at baseline for ADLs/mobility. Pt able to manage ADLs in room independently and mobilize entire unit without AD. Pt denies SOB, no LOB or safety concerns noted. Encouraged continued mobility during admission though no further skilled OT services needed at this time. OT to sign off acutely.   HR 33bpm-42 bpm during session (baseline per pt)      If plan is discharge home, recommend the following:      Functional Status Assessment  Patient has not had a recent decline in their functional status  Equipment Recommendations  None recommended by OT    Recommendations for Other Services       Precautions / Restrictions Precautions Precautions: Other (comment) Precaution Comments: monitor HR (brady at baseline) Restrictions Weight Bearing Restrictions Per Provider Order: No      Mobility Bed Mobility Overal bed mobility: Modified Independent                  Transfers Overall transfer level: Independent Equipment used: None                      Balance Overall balance assessment: No apparent balance deficits (not formally assessed)                                         ADL either performed or assessed with clinical judgement   ADL Overall ADL's : Independent                                       General ADL Comments: able to don shoes, manage ADLs standing at sink and mobilize entire unit without AD, without LOB  and denies SOB. Pt reports mobilizing to/from bathroom without issues     Vision Baseline Vision/History: 1 Wears glasses Ability to See in Adequate Light: 0 Adequate Patient Visual Report: No change from baseline Vision Assessment?: No apparent visual deficits     Perception         Praxis         Pertinent Vitals/Pain Pain Assessment Pain Assessment: No/denies pain     Extremity/Trunk Assessment Upper Extremity Assessment Upper Extremity Assessment: Overall WFL for tasks assessed   Lower Extremity Assessment Lower Extremity Assessment: Defer to PT evaluation   Cervical / Trunk Assessment Cervical / Trunk Assessment: Normal   Communication Communication Communication: No apparent difficulties Cueing Techniques: Verbal cues;Gestural cues   Cognition Arousal: Alert Behavior During Therapy: WFL for tasks assessed/performed Overall Cognitive Status: Within Functional Limits for tasks assessed                                       General Comments  pt reports HR has been low for years. 33-42 bpm during session  Exercises     Shoulder Instructions      Home Living Family/patient expects to be discharged to:: Private residence Living Arrangements: Spouse/significant other Available Help at Discharge: Family;Available 24 hours/day Type of Home: House Home Access: Stairs to enter [employer] of Steps: through the garage   Home Layout: Two level;Bed/bath upstairs Alternate Level Stairs-Number of Steps: flight   Bathroom Shower/Tub: Producer, television/film/video: Standard                Prior Functioning/Environment Prior Level of Function : Independent/Modified Independent;Driving             Mobility Comments: no AD, has 2 acres that he walks around daily ADLs Comments: Indep with ADLs, IADLs, 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 very active        OT  Problem List:        OT Treatment/Interventions:      OT Goals(Current goals can be found in the care plan section) Acute Rehab OT Goals Patient Stated Goal: resolve SOB issues, get back home OT Goal Formulation: All assessment and education complete, DC therapy  OT Frequency:      Co-evaluation              AM-PAC OT "6 Clicks" Daily Activity     Outcome Measure Help from another person eating meals?: None Help from another person taking care of personal grooming?: None Help from another person toileting, which includes using toliet, bedpan, or urinal?: None Help from another person bathing (including washing, rinsing, drying)?: None Help from another person to put on and taking off regular upper body clothing?: None Help from another person to put on and taking off regular lower body clothing?: None 6 Click Score: 24   End of Session Equipment Utilized During Treatment: Gait belt Nurse Communication: Mobility status  Activity Tolerance: Patient tolerated treatment well Patient left: in bed;with call bell/phone within reach  OT Visit Diagnosis: Muscle weakness (generalized) (M62.81)                Time: 9528-4132 OT Time Calculation (min): 21 min Charges:  OT General Charges $OT Visit: 1 Visit OT Evaluation $OT Eval Low Complexity: 1 Low  Lawrence Pretty, OTR/L Acute Rehab Services Office: [phone]   Annabella Barr 04/01/2023, 8:23 AM

## 2023-04-01 NOTE — Progress Notes (Signed)
 Mobility Specialist Progress Note:   04/01/23 1130  Mobility  Activity Ambulated independently in hallway  Level of Assistance Independent  Assistive Device None  Distance Ambulated (ft) 500 ft  Activity Response Tolerated well  Mobility Referral Yes  Mobility visit 1 Mobility  Mobility Specialist Start Time (ACUTE ONLY) 1130  Mobility Specialist Stop Time (ACUTE ONLY) 1140  Mobility Specialist Time Calculation (min) (ACUTE ONLY) 10 min   Pt agreeable to mobility session. Required no physical assistance throughout ambulation. SpO2 99-100% on RA throughout ambulation.   Jason Day Mobility Specialist Please contact via SecureChat or  Rehab office at [phone]

## 2023-04-01 NOTE — Evaluation (Signed)
 Physical Therapy Brief Evaluation and Discharge Note Patient Details Name: Jason Day MRN: [id number] DOB: [birth year] Today's Date: 04/01/2023   History of Present Illness  Pt is an 83 y.o. male admitted 03/29/23 with SOB and lower extremity edema in setting of acute CHF exacerbation. Pt also with mildly supratherapeutic INR and elevated troponin. PMH: CHF, A-Fib, CAD s/p CABG 2018, HLD, and CKD3.   Clinical Impression  Pt admitted with above diagnosis. PTA he lived in a two-story house with his wife, ambulated without an AD, and was independent with functional mobility as well as ADLs/IADLs. Pt appears to be at baseline for bed mobility, transfers, gait, and stairs. He demonstrated good safety awareness, taking breaks prn as he fatigued. Educated on PLB technique. Pt had no LOB or safety concerns with mobility.  Pt reports engaging in daily walks 3x/day ambulating ~22ft at home. Recommended pt continue to complete this program and discussed BLE HEP he could complete in various positions to maintain BLE strength. Patient feels ready and safe for d/c Home. I have answered all his questions related to mobility. No further skilled PT services needed at this time. PT will sign off acutely. Recommend OOB<>chair 3x/day and daily walks during admission.      PT Assessment Patient does not need any further PT services  Assistance Needed at Discharge  PRN    Equipment Recommendations None recommended by PT  Recommendations for Other Services       Precautions/Restrictions Precautions Precautions: Other (comment) Precaution Comments: monitor HR (brady at baseline) Restrictions Weight Bearing Restrictions Per Provider Order: No        Mobility  Bed Mobility   Supine/Sidelying to sit: Independent Sit to supine/sidelying: Independent General bed mobility comments: HOB flat no use of bedrails  Transfers Overall transfer level: Independent Equipment used: None               General  transfer comment: STS from lowest setting of bed    Ambulation/Gait Ambulation/Gait assistance: Independent Gait Distance (Feet): 250 Feet (x3 with standing rest breaks between bouts. Cued PLB to help with recovery.) Assistive device: None Gait Pattern/deviations: WFL(Within Functional Limits) Gait Speed: Pace WFL    Home Activity Instructions Home Activity Instructions: Continue with walking program. Pt reports ambulating along his 26ft driveway 3x/day with rest breaks in between. Discussed BLE HEP he could perform while supine, seated, or standing. Pt declined handout and reported he would prefer to walk instead of completing the exercises.  Stairs Stairs: Yes Stairs assistance: Independent Stair Management: One rail Right, Alternating pattern, Forwards, Step to pattern Number of Stairs: 10 General stair comments: Ascending, pt utilized a reciprocal gait pattern. Descending, pt utilized a step to pattern leading with RLE.  Modified Rankin (Stroke Patients Only)        Balance Overall balance assessment: No apparent balance deficits (not formally assessed)                        Pertinent Vitals/Pain PT - Brief Vital Signs All Vital Signs Stable: Yes Pain Assessment Pain Assessment: No/denies pain     Home Living Family/patient expects to be discharged to:: Private residence Living Arrangements: Spouse/significant other Available Help at Discharge: Family;Available 24 hours/day Home Environment: Stairs to enter;Stairs in home  Stairs-Number of Steps:  (1 to enter from garage; Full flight to second floor) Home Equipment: None        Prior Function Level of Independence: Independent      UE/LE Assessment  UE ROM/Strength/Tone/Coordination: [facility]    LE ROM/Strength/Tone/Coordination: [facility]      Communication   Communication Communication: No apparent difficulties Cueing Techniques: Verbal cues;Gestural cues     Cognition Overall Cognitive Status:  Appears within functional limits for tasks assessed/performed       General Comments General comments (skin integrity, edema, etc.): pt reports HR has been low for years. 33-42 bpm during session    Exercises     Assessment/Plan    PT Problem List         PT Visit Diagnosis Other abnormalities of gait and mobility (R26.89)    No Skilled PT All education completed;Patient at baseline level of functioning;Patient is independent with all acitivity/mobility;Patient will have necessary level of assist by caregiver at discharge   Co-evaluation                AMPAC 6 Clicks Help needed turning from your back to your side while in a flat bed without using bedrails?: None Help needed moving from lying on your back to sitting on the side of a flat bed without using bedrails?: None Help needed moving to and from a bed to a chair (including a wheelchair)?: None Help needed standing up from a chair using your arms (e.g., wheelchair or bedside chair)?: None Help needed to walk in hospital room?: None Help needed climbing 3-5 steps with a railing? : None 6 Click Score: 24      End of Session   Activity Tolerance: Patient tolerated treatment well Patient left: in bed;with call bell/phone within reach Nurse Communication: Mobility status PT Visit Diagnosis: Other abnormalities of gait and mobility (R26.89)     Time: 6213-0865 PT Time Calculation (min) (ACUTE ONLY): 10 min  Charges:   PT Evaluation $PT Eval Low Complexity: 1 Low      Glenford Lanes, PT, DPT Acute Rehabilitation Services Office: [phone] Secure Chat Preferred  Riva Chester  04/01/2023, 9:55 AM

## 2023-04-01 NOTE — Plan of Care (Signed)
  Problem: Education: Goal: Knowledge of General Education information will improve Description: Including pain rating scale, medication(s)/side effects and non-pharmacologic comfort measures Outcome: Progressing   Problem: Health Behavior/Discharge Planning: Goal: Ability to manage health-related needs will improve Outcome: Progressing   Problem: Clinical Measurements: Goal: Ability to maintain clinical measurements within normal limits will improve Outcome: Progressing Goal: Cardiovascular complication will be avoided Outcome: Progressing   Problem: Safety: Goal: Ability to remain free from injury will improve Outcome: Progressing

## 2023-04-01 NOTE — Telephone Encounter (Signed)
 The CT has been cancelled per pt- he is currently in the hospital.

## 2023-04-01 NOTE — TOC Initial Note (Addendum)
Transition of Care [facility]) - Initial/Assessment Note    Patient Details  Name: Jason Day MRN: [id number] Date of Birth: 10-Mar-1940  Transition of [facility]) CM/SW Contact:    Leone Haven, RN Phone Number: 04/01/2023, 11:55 AM  Clinical Narrative:                 From home with spouse, has PCP, Dr. Lenox Ponds and insurance on file, states has no HH services in place at this time or DME at home.  States wife will transport him  home at [employer] and she is support system, states gets medications from [employer].  Pta self ambulatory. Wife states they would like to get oxygen for patient, they were doing an oxygen ambulatory sat check at PCP office when his HR went down.  She states they will need oxygen and the Dr had spoken with a DME company.  Patient states he think it was [employer] that the doctor spoke with.  NCM contacted Zach with [employer] to check on.  Timothy Lasso will get back with this NCM , after he checks on information.   Per Zack they were not working on oxygen for this patient.  The wife gave information to Staff RN to give to NCM said it was [employer] [facility] that was working on oxygen for patient,  NCM contacted Zollie Beckers today,  he states he will have to look into and get back with this NCM. Per Zollie Beckers with [employer], he states they were not working on oxygen for this patient.  This NCM let the wife know that patient sats did not qualify for medicare to pay for oxygen for him.  She states she understands.  Expected Discharge Plan: Home/Self Care Barriers to Discharge: Continued Medical Work up   Patient Goals and CMS Choice Patient states their goals for this hospitalization and ongoing recovery are:: return home   Choice offered to / list presented to : NA      Expected Discharge Plan and Services In-house Referral: NA Discharge Planning Services: CM Consult Post Acute Care Choice: NA Living arrangements for the past 2 months: Single Family Home                 DME Arranged: N/A DME Agency: NA        HH Arranged: NA          Prior Living Arrangements/Services Living arrangements for the past 2 months: Single Family Home Lives with:: Spouse Patient language and need for interpreter reviewed:: Yes Do you feel safe going back to the place where you live?: Yes      Need for Family Participation in Patient Care: Yes (Comment) Care giver support system in place?: Yes (comment)   Criminal Activity/Legal Involvement Pertinent to Current Situation/Hospitalization: No - Comment as needed  Activities of Daily Living   ADL Screening (condition at time of admission) Independently performs ADLs?: Yes (appropriate for developmental age) Is the patient deaf or have difficulty hearing?: No Does the patient have difficulty seeing, even when wearing glasses/contacts?: No Does the patient have difficulty concentrating, remembering, or making decisions?: No  Permission Sought/Granted Permission sought to share information with : Case Manager Permission granted to share information with : Yes, Verbal Permission Granted              Emotional Assessment Appearance:: Appears stated age Attitude/Demeanor/Rapport: Engaged Affect (typically observed): Appropriate Orientation: : Oriented to Self, Oriented to Place, Oriented to  Time, Oriented to Situation   Psych Involvement: No (comment)  Admission diagnosis:  Symptomatic bradycardia [R00.1] Supratherapeutic INR [R79.1] Acute on chronic systolic (congestive) heart failure (HCC) [I50.23] Patient Active Problem List   Diagnosis Date Noted   Bowel incontinence 03/30/2023   Supratherapeutic INR 03/29/2023   HFrEF (heart failure with reduced ejection fraction) (HCC) 03/29/2023   Acute on chronic systolic (congestive) heart failure (HCC) 03/29/2023   CKD stage 3a, GFR 45-59 ml/min (HCC) 03/29/2023   Skin lesion 11/06/2022   Marital conflict 11/17/2021   Memory impairment 12/30/2020   Coagulation defect (HCC) 12/30/2020   Incontinence  of feces 12/30/2020   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   Chronic diarrhea 12/29/2020   Weight loss 12/29/2020   Depression, major, single episode, mild (HCC) 12/29/2020   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   Mitral regurgitation 12/25/2018   S/P reverse total shoulder arthroplasty, 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 [place] [address] Phone: [phone] Fax: [phone]     Social Drivers of Health (SDOH) Social History: SDOH Screenings   Food Insecurity: No Food Insecurity (03/30/2023)  Housing: Low Risk  (03/30/2023)  Transportation Needs: No Transportation Needs (03/30/2023)  Utilities: Not At Risk (03/30/2023)  Alcohol Screen: Low Risk  (12/04/2022)  Depression (PHQ2-9): Low Risk  (02/28/2023)  Financial Resource Strain: Low Risk  (12/04/2022)  Physical Activity: Insufficiently Active (12/04/2022)  Social Connections: Moderately Isolated (03/30/2023)  Stress: No Stress Concern Present (12/04/2022)  Tobacco Use: Medium Risk (03/29/2023)  Health Literacy: Adequate Health Literacy (12/04/2022)   SDOH Interventions:     Readmission Risk Interventions     No data to display

## 2023-04-01 NOTE — Telephone Encounter (Signed)
 Patients wife called stating the patient is at the hospital and will not be able to attend the appt tomorrow for the CT. Asked for it to be canceled.

## 2023-04-02 ENCOUNTER — Inpatient Hospital Stay (HOSPITAL_COMMUNITY)
Admission: EM | Disposition: A | Discharge status: Home / Self Care | Payer: Self-pay | Source: Ambulatory Visit | Attending: Internal Medicine

## 2023-04-02 ENCOUNTER — Encounter (HOSPITAL_COMMUNITY): Payer: Self-pay | Admitting: Cardiology

## 2023-04-02 ENCOUNTER — Encounter (HOSPITAL_COMMUNITY)
Admission: EM | Disposition: A | Discharge status: Home / Self Care | Payer: Self-pay | Source: Ambulatory Visit | Attending: Internal Medicine

## 2023-04-02 ENCOUNTER — Ambulatory Visit (HOSPITAL_COMMUNITY): Payer: PPO

## 2023-04-02 ENCOUNTER — Other Ambulatory Visit: Payer: Self-pay

## 2023-04-02 DIAGNOSIS — E782 Mixed hyperlipidemia: Secondary | ICD-10-CM | POA: Diagnosis not present

## 2023-04-02 DIAGNOSIS — I4821 Permanent atrial fibrillation: Secondary | ICD-10-CM | POA: Diagnosis not present

## 2023-04-02 DIAGNOSIS — I442 Atrioventricular block, complete: Secondary | ICD-10-CM | POA: Diagnosis not present

## 2023-04-02 DIAGNOSIS — R001 Bradycardia, unspecified: Secondary | ICD-10-CM | POA: Diagnosis not present

## 2023-04-02 DIAGNOSIS — N1831 Chronic kidney disease, stage 3a: Secondary | ICD-10-CM | POA: Diagnosis not present

## 2023-04-02 DIAGNOSIS — I4891 Unspecified atrial fibrillation: Secondary | ICD-10-CM | POA: Diagnosis not present

## 2023-04-02 DIAGNOSIS — I5023 Acute on chronic systolic (congestive) heart failure: Secondary | ICD-10-CM | POA: Diagnosis not present

## 2023-04-02 HISTORY — PX: PACEMAKER IMPLANT: EP1218

## 2023-04-02 LAB — PROTIME-INR
INR: 1.9 — ABNORMAL HIGH (ref 0.8–1.2)
Prothrombin Time: 22.4 s — ABNORMAL HIGH (ref 11.4–15.2)

## 2023-04-02 SURGERY — PACEMAKER IMPLANT

## 2023-04-02 MED ORDER — SODIUM CHLORIDE 0.9 % IV SOLN
INTRAVENOUS | Status: AC
  Filled 2023-04-02: qty 2

## 2023-04-02 MED ORDER — SODIUM CHLORIDE 0.9 % IV SOLN
250.0000 mL | INTRAVENOUS | Status: DC
  Administered 2023-04-02: 250 mL via INTRAVENOUS

## 2023-04-02 MED ORDER — SODIUM CHLORIDE 0.9% FLUSH: 3.0000 mL | Freq: Two times a day (BID) | INTRAVENOUS | Status: DC

## 2023-04-02 MED ORDER — SODIUM CHLORIDE 0.9% FLUSH: 3.0000 mL | INTRAVENOUS | Status: DC | PRN

## 2023-04-02 MED ORDER — CEFAZOLIN SODIUM-DEXTROSE 2-4 GM/100ML-% IV SOLN
INTRAVENOUS | Status: AC
  Filled 2023-04-02: qty 100

## 2023-04-02 MED ORDER — LIDOCAINE HCL (PF) 1 % IJ SOLN
INTRAMUSCULAR | Status: AC
  Filled 2023-04-02: qty 60

## 2023-04-02 MED ORDER — CHLORHEXIDINE GLUCONATE 4 % EX SOLN: 60.0000 mL | Freq: Once | CUTANEOUS | Status: DC

## 2023-04-02 MED ORDER — LIDOCAINE HCL (PF) 1 % IJ SOLN
INTRAMUSCULAR | Status: DC | PRN
  Administered 2023-04-02: 40 mL
  Administered 2023-04-02: 20 mL

## 2023-04-02 MED ORDER — CHLORHEXIDINE GLUCONATE 4 % EX SOLN
60.0000 mL | Freq: Once | CUTANEOUS | Status: AC
  Administered 2023-04-02: 4 via TOPICAL
  Filled 2023-04-02: qty 15

## 2023-04-02 MED ORDER — HEPARIN (PORCINE) IN NACL 1000-0.9 UT/500ML-% IV SOLN
INTRAVENOUS | Status: DC | PRN
  Administered 2023-04-02: 500 mL

## 2023-04-02 MED ORDER — CEFAZOLIN SODIUM-DEXTROSE 2-4 GM/100ML-% IV SOLN
2.0000 g | INTRAVENOUS | Status: AC
  Administered 2023-04-02: 2 g via INTRAVENOUS

## 2023-04-02 MED ORDER — SODIUM CHLORIDE 0.9 % IV SOLN
80.0000 mg | INTRAVENOUS | Status: AC
  Administered 2023-04-02: 80 mg

## 2023-04-02 SURGICAL SUPPLY — 13 items
CABLE SURGICAL S-101-97-12 (CABLE) ×1 IMPLANT
CATH S G BIP PACING (CATHETERS) IMPLANT
KIT ACCESSORY SELECTRA FIX CVD (MISCELLANEOUS) IMPLANT
LEAD SELECTRA 3D-55-42 (CATHETERS) IMPLANT
LEAD SOLIA S PRO MRI 60 (Lead) IMPLANT
NDL PERC 18GX7CM (NEEDLE) IMPLANT
NEEDLE PERC 18GX7CM (NEEDLE) ×1 IMPLANT
PACEMAKER AMVIA SR-T 460164 (Pacemaker) IMPLANT
PAD DEFIB RADIO PHYSIO CONN (PAD) ×1 IMPLANT
PPM AMVIA SR-T 460164 (Pacemaker) ×1 IMPLANT
SHEATH 9FR PRELUDE SNAP 13 (SHEATH) IMPLANT
SHEATH PINNACLE 6F 10CM (SHEATH) IMPLANT
TRAY PACEMAKER INSERTION (PACKS) ×1 IMPLANT

## 2023-04-02 NOTE — Progress Notes (Signed)
Pt refuses to have second IV site started, stating that he "will have it done in the morning right before the procedure because my doctor is coming to do the procedure". Explained to patient that it is preferred that IV be already in place when he is call to the procedure area. Pt insistent on not having IV put in

## 2023-04-02 NOTE — Plan of Care (Signed)
Pt does not show signs of infection.

## 2023-04-02 NOTE — Plan of Care (Signed)
  Problem: Clinical Measurements: Goal: Ability to maintain clinical measurements within normal limits will improve Outcome: Progressing   Problem: Coping: Goal: Level of anxiety will decrease Outcome: Progressing   Problem: Pain Managment: Goal: General experience of comfort will improve and/or be controlled Outcome: Progressing

## 2023-04-02 NOTE — Progress Notes (Signed)
Progress Note   Patient: Jason Day XBM:[id number] DOB: 02/15/1941 DOA: 03/29/2023     4 DOS: the patient was seen and examined on 04/02/2023   Brief hospital course: Jason Day was admitted to the hospital with the working diagnosis of heart failure decompensation.   83 yo male with the past medical history of heart failure, atrial fibrillation, coronary artery disease, CKD 3a, who presented with dyspnea. Reported 3 days of worsening dyspnea and lower extremity edema. On his initial physical examination his heart rate was 40 to 60, blood pressure 136/72, 99/53, 83/45, HR 38 to 61, 02 saturation 99%, heart with S1 and S2 present, irregularly irregular, lungs with no wheezing or rales with no gallops, abdomen with no distention, positive lower extremity edema.   Na 140, K 4,9 Cl 106, bicarbonate 25, glucose 101, bun 25 cr 1,54  BNP 1,287  High sensitive troponin 94 and 110  Wbc 5,2 hgb 12,9 plt 102  INR 5,7   Urine analysis SG 1,004, negative protein, negative leukocytes and negative hgb.   Chest radiograph with hyperinflation, cardiomegaly, mild hilar vascular congestion, sternotomy wires in place.   EKG 44 bpm, left axis deviation, right bundle branch block, qtc 494, sinus rhythm with 1st degree AV block, positive 2 scape junctional beats, with no significant ST segment or T wave changes.   Patient was place on furosemide for diuresis. Developed bradycardia.   02/10 persistent bradycardia with rate down to 20 to 30, positive variable block.  EP consulted and plan for pacemaker implantation.  02/11 pacemaker implantation today, plan to observe on telemetry overnight.   Assessment and Plan: * Acute on chronic systolic (congestive) heart failure (HCC) Echocardiogram with improvement in LV systolic function to 45 to 50%, mild dilated internal LV cavity, chronic basal inferior aneurysmal segment.  Hypokinetic basal inferior lateral segment and mid inferior segment.  Moderate asymmetric left  ventricular hypertrophy of the basal segment. RV systolic function low normal, LA with severe dilatation, RA with moderate dilatation, moderate MR, RVSP 35.3, trivial pericardial effusion.    Clinically euvolemic state.  Systolic blood pressure 100 mmHg range   Continue to hold, furosemide, Entresto and spironolactone to avoid hypotension.  Continue with SGLT 2 inh.   Limited medical therapy due to bradycardia and risk of hypotension.   Permanent atrial fibrillation (HCC) Telemetry with atrial flutter/ atrial fibrillation, rate 40's bpm with variable block.   Off B blocker  Keep K at 4 and Mg at 2.  Target MAP 60 or greater.  Out of bed to chair as tolerated. PT and OT.    Plan for pacemaker today.   Anticoagulation with warfarin, per pharmacy protocol.  INR today 1,9.  Warfarin has been on hold, plan to resume likely tomorrow, continue close follow up INR.   CKD stage 3a, GFR 45-59 ml/min (HCC) Follow up renal function in am, his serum cr has been stable over the last 48 hrs.   Plan to continue close monitoring of renal function and electrolytes.  Avoid hypotension and nephrotoxic medications.  Continue to hold on furosemide.   Hyperlipidemia Continue with statin therapy.   Coronary artery disease S/p CABG in 2018  No chest pain or angina. High sensitive troponin elevation due to heart failure exacerbation.  Ruled out for acute coronary syndrome.   Depression, major, single episode, mild (HCC) Continue with amitriptyline.    Bowel incontinence Chronic condition, will do CT abdomen and pelvis with contrast when renal function more stable.  Subjective: Patient with no chest pain or dyspnea, plan for pacemaker today   Physical Exam: Vitals:   04/02/23 1254 04/02/23 1259 04/02/23 1304 04/02/23 1357  BP:    (!) 106/59  Pulse: (!) 0 (!) 0 (!) 0 63  Resp:    18  Temp:    98.4 F (36.9 C)  TempSrc:    Oral  SpO2:    100%  Weight:      Height:        Neurology awake and alert ENT with no pallor or icterus Cardiovascular with S1 and S2 present and bradycardic, with no gallops or rubs, positive systolic murmur at the apex. Regular  Respiratory with no rales or wheezing or rhonchi Abdomen with no distention  No lower extremity edema  Data Reviewed:    Family Communication: no family at the bedside   Disposition: Status is: Inpatient Remains inpatient appropriate because: pacemaker implantation   Planned Discharge Destination: Home     Author: Coralie Keens, MD 04/02/2023 2:38 PM  For on call review [email or web address].

## 2023-04-02 NOTE — Progress Notes (Addendum)
Rounding Note    Patient Name: Jason Day Date of Encounter: 04/02/2023  [facility] Cardiologist: Charlton Haws, MD   Subjective   Rested, slept well,  no CP, SOB  Inpatient Medications    Scheduled Meds:  amitriptyline  10 mg Oral QHS   atorvastatin  10 mg Oral Daily   chlorhexidine  60 mL Topical Once   colestipol  0.5 g Oral Daily   gentamicin (GARAMYCIN) 80 mg in sodium chloride 0.9 % 500 mL irrigation  80 mg Irrigation On Call   sodium chloride flush  3 mL Intravenous Q12H   sodium chloride flush  3-10 mL Intravenous Q12H   Continuous Infusions:  sodium chloride      ceFAZolin (ANCEF) IV     PRN Meds: acetaminophen **OR** acetaminophen, melatonin, ondansetron (ZOFRAN) IV, sodium chloride flush, sodium chloride flush   Vital Signs    Vitals:   04/01/23 1625 04/01/23 1925 04/02/23 0627 04/02/23 0733  BP: (!) 123/40  (!) 130/54 (!) 133/52  Pulse: (!) 30 (!) 29 (!) 30 (!) 31  Resp: 18  18 19   Temp: 97.9 F (36.6 C)  (!) 97.5 F (36.4 C) 97.8 F (36.6 C)  TempSrc: Oral  Oral Oral  SpO2: 100% 99% 100% 100%  Weight:   68.1 kg   Height:        Intake/Output Summary (Last 24 hours) at 04/02/2023 0800 Last data filed at 04/01/2023 1849 Gross per 24 hour  Intake 954 ml  Output 125 ml  Net 829 ml      04/02/2023    6:27 AM 04/01/2023    4:09 AM 03/31/2023    4:28 AM  Last 3 Weights  Weight (lbs) 150 lb 3.2 oz 148 lb 14.4 oz 147 lb 12.8 oz  Weight (kg) 68.13 kg 67.541 kg 67.042 kg      Telemetry    AFib, SVR, rates largely 30 or better, dips into 27-29 range  - Personally Reviewed  ECG    No new EKGs - Personally Reviewed  Physical Exam   GEN: No acute distress.   Neck: No JVD Cardiac: RRR, bradycardic, no murmurs, rubs, or gallops.  Respiratory: CTA b/l. GI: Soft, nontender, non-distended  MS: No edema; No deformity. Neuro:  Nonfocal  Psych: Normal affect   Labs    High Sensitivity Troponin:   Recent Labs  Lab 03/29/23 1349  03/29/23 1550 03/30/23 0733  TROPONINIHS 94* 110* 145*     Chemistry Recent Labs  Lab 03/29/23 1349 03/29/23 1413 03/30/23 0222 03/31/23 1308 04/01/23 0235  NA 140   < > 139 139 140  K 4.9   < > 4.4 3.9 4.4  CL 106   < > 105 101 102  CO2 25  --  25 28 27   GLUCOSE 101*   < > 88 110* 88  BUN 25*   < > 24* 26* 30*  CREATININE 1.54*   < > 1.63* 1.68* 1.68*  CALCIUM 9.0  --  8.9 8.6* 8.8*  MG 2.2  --  2.1  --  2.0  PROT 6.5  --  6.3*  --   --   ALBUMIN 3.7  --  3.6  --   --   AST 20  --  18  --   --   ALT 20  --  17  --   --   ALKPHOS 88  --  91  --   --   BILITOT 2.2*  --  2.3*  --   --  GFRNONAA 45*  --  42* 40* 40*  ANIONGAP 9  --  9 10 11    < > = values in this interval not displayed.    Lipids No results for input(s): "CHOL", "TRIG", "HDL", "LABVLDL", "LDLCALC", "CHOLHDL" in the last 168 hours.  Hematology Recent Labs  Lab 03/29/23 1349 03/29/23 1413 03/30/23 0222 04/01/23 0801  WBC 5.2  --  4.5 6.1  RBC 4.19*  --  4.16* 4.53  HGB 12.9* 12.9* 12.8* 14.1  HCT 39.0 38.0* 38.8* 41.5  MCV 93.1  --  93.3 91.6  MCH 30.8  --  30.8 31.1  MCHC 33.1  --  33.0 34.0  RDW 14.3  --  14.1 14.1  PLT 102*  --  102* 138*   Thyroid No results for input(s): "TSH", "FREET4" in the last 168 hours.  BNP Recent Labs  Lab 03/29/23 1350 03/30/23 0222  BNP 1,287.3* 1,485.4*    DDimer No results for input(s): "DDIMER" in the last 168 hours.   Radiology    No results found.  Cardiac Studies   Echocardiogram 03/30/2023:  1. Left ventricular ejection fraction, by estimation, is 45 to 50%. The  left ventricle has mildly decreased function. The left ventricle  demonstrates regional wall motion abnormalities (see scoring  diagram/findings for description). Basal inferior  aneurysmal segment also present on prior study. The left ventricular  internal cavity size was mildly dilated. There is moderate asymmetric left  ventricular hypertrophy of the basal segment. Left ventricular  diastolic  parameters are indeterminate.   2. Right ventricular systolic function is low normal. The right  ventricular size is normal. There is normal pulmonary artery systolic  pressure. The estimated right ventricular systolic pressure is 35.3 mmHg.   3. Left atrial size was severely dilated.   4. Right atrial size was moderately dilated.   5. The mitral valve is degenerative. Moderate mitral valve regurgitation.   6. Tricuspid valve regurgitation is mild to moderate.   7. The aortic valve is tricuspid. There is moderate calcification of the  aortic valve. Aortic valve regurgitation is not visualized.   8. Aortic dilatation noted. There is mild dilatation of the ascending  aorta, measuring 39 mm.   9. The inferior vena cava is normal in size with greater than 50%  respiratory variability, suggesting right atrial pressure of 3 mmHg.      - Echo (1/19): EF 35% - Echo (2020): EF 30-35% - Echo (8/22): EF 30-35%, moderate-severe MR - Echo (1/24): EF 25-30%, moderate LV dilation, severely decreased RV systolic function, severe LAE, moderate MR.  4. 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  Moderate-severe on 8/22 echo but moderate on 1/24 echo.     Patient Profile     83 y.o. male w/PMHx of CAD (CABG 2018), ICM, chronic CHF,  HLD, RBBB, AFib (permanent) admitted with bradycardia, and GI issues (chronic), and some mentions of hematuria.  Toprol stopped on admission, none given here > remains markedly bradycardic  Assessment & Plan    AFib w/SVR CHA2DS2Vasc is 4, on warfarin > tox on admission (pt denies hematuria, UA fairly unremarkable) INR TODAY is 1.9   Marked bradycardia continues despite no nodal blocking agent now for days Pt comfortable and asymptomatic , BP stable HRs dipping to 27-29, largely 30's Evidence of CHB with regular R-R  Planned for PPM today LVEF improved by last echo to 45-50%, fairly narrow RBBB not felt to benefit from CRT in the past), permanent AFib  >> single change PPM planned  Re-discussed procedure today he remains agreeable D/w RN, pt agreeable to RUE IV this AM   Otherwise Care as per IM team GI issues Creat stable yesterday (no labs this AM) >> suspect will improve with better HRs  For questions or updates, please contact [facility] Please consult [email or web address] for contact info under        Signed, Sheilah Pigeon, PA-C  04/02/2023, 8:00 AM

## 2023-04-02 NOTE — Progress Notes (Incomplete)
PROGRESS NOTE    Jason Day  UJW:[id number] DOB: [birth year] DOA: 03/29/2023 PCP: Jac Canavan, PA-C  82/M w systolic CHF, Afib, CAD, CKD 3a, who presented with dyspnea and lower extremity edema. In ED, bradycardic, cr 1,54, BNP 1,287,  troponin 94 and 110, CXR w hyperinflation, cardiomegaly, mild hilar vascular congestion, sternotomy wires in place.  -placed on IV lasix, then developed bradycardia.  -2/10 persistent bradycardia with rate down to 20 to 30, positive variable block.  EP consulted and plan for pacemaker implantation.  2/11 pacemaker implantation today, plan to observe on telemetry overnight.   Subjective:  Assessment and Plan:  Acute on chronic systolic (congestive) heart failure (HCC) -Echo w EF 45 to 50%, chronic basal inferior aneurysmal segment, Hypokinetic basal inferior lateral segment and mid inferior segment. RV low normal, moderate MR,  -diuresed w/ IV lasix -now holding furosemide, Entresto and spironolactone to avoid hypotension.  Continue with SGLT 2 inh.   Complete Heart block Symptomatic bradycardia -EP consulting, PPM today  Permanent atrial fibrillation (HCC) Warfarin has been on hold, plan to resume likely tomorrow, continue close follow up INR.   CKD stage 3a, GFR 45-59 ml/min (HCC) -stable Avoid hypotension and nephrotoxic medications.  -holding furosemide.   Hyperlipidemia Continue with statin therapy.   Coronary artery disease S/p CABG in 2018  No chest pain or angina. High sensitive troponin elevation due to heart failure exacerbation.  Ruled out for acute coronary syndrome.   Depression, major, single episode, mild (HCC) Continue with amitriptyline.    Bowel incontinence Chronic condition, will do CT abdomen and pelvis with contrast when renal function more stable.    DVT prophylaxis: warfarin on hold Code Status: Full Code Family Communication: Disposition Plan:   Consultants:    Procedures:   Antimicrobials:     Objective: Vitals:   04/02/23 1254 04/02/23 1259 04/02/23 1304 04/02/23 1357  BP:    (!) 106/59  Pulse: (!) 0 (!) 0 (!) 0 63  Resp:    18  Temp:    98.4 F (36.9 C)  TempSrc:    Oral  SpO2:    100%  Weight:      Height:        Intake/Output Summary (Last 24 hours) at 04/02/2023 1646 Last data filed at 04/02/2023 1500 Gross per 24 hour  Intake 238.01 ml  Output 425 ml  Net -186.99 ml   Filed Weights   03/31/23 0428 04/01/23 0409 04/02/23 0627  Weight: 67 kg 67.5 kg 68.1 kg    Examination:      Data Reviewed:   CBC: Recent Labs  Lab 03/29/23 1349 03/29/23 1413 03/30/23 0222 04/01/23 0801  WBC 5.2  --  4.5 6.1  NEUTROABS 3.9  --  2.7  --   HGB 12.9* 12.9* 12.8* 14.1  HCT 39.0 38.0* 38.8* 41.5  MCV 93.1  --  93.3 91.6  PLT 102*  --  102* 138*   Basic Metabolic Panel: Recent Labs  Lab 03/29/23 1349 03/29/23 1413 03/30/23 0222 03/31/23 1308 04/01/23 0235  NA 140 141 139 139 140  K 4.9 4.9 4.4 3.9 4.4  CL 106 105 105 101 102  CO2 25  --  25 28 27   GLUCOSE 101* 95 88 110* 88  BUN 25* 28* 24* 26* 30*  CREATININE 1.54* 1.70* 1.63* 1.68* 1.68*  CALCIUM 9.0  --  8.9 8.6* 8.8*  MG 2.2  --  2.1  --  2.0   GFR: Estimated Creatinine Clearance: 32.7 mL/min (A) (  by C-G formula based on SCr of 1.68 mg/dL (H)). Liver Function Tests: Recent Labs  Lab 03/29/23 1349 03/30/23 0222  AST 20 18  ALT 20 17  ALKPHOS 88 91  BILITOT 2.2* 2.3*  PROT 6.5 6.3*  ALBUMIN 3.7 3.6   No results for input(s): "LIPASE", "AMYLASE" in the last 168 hours. No results for input(s): "AMMONIA" in the last 168 hours. Coagulation Profile: Recent Labs  Lab 03/29/23 1349 03/30/23 0222 04/01/23 0801 04/02/23 0244  INR 5.7* 5.1* 2.1* 1.9*   Cardiac Enzymes: No results for input(s): "CKTOTAL", "CKMB", "CKMBINDEX", "TROPONINI" in the last 168 hours. BNP (last 3 results) No results for input(s): "PROBNP" in the last 8760 hours. HbA1C: No results for input(s): "HGBA1C" in  the last 72 hours. CBG: No results for input(s): "GLUCAP" in the last 168 hours. Lipid Profile: No results for input(s): "CHOL", "HDL", "LDLCALC", "TRIG", "CHOLHDL", "LDLDIRECT" in the last 72 hours. Thyroid Function Tests: No results for input(s): "TSH", "T4TOTAL", "FREET4", "T3FREE", "THYROIDAB" in the last 72 hours. Anemia Panel: No results for input(s): "VITAMINB12", "FOLATE", "FERRITIN", "TIBC", "IRON", "RETICCTPCT" in the last 72 hours. Urine analysis:    Component Value Date/Time   COLORURINE STRAW (A) 03/29/2023 1850   APPEARANCEUR CLEAR 03/29/2023 1850   LABSPEC 1.004 (L) 03/29/2023 1850   PHURINE 5.0 03/29/2023 1850   GLUCOSEU NEGATIVE 03/29/2023 1850   GLUCOSEU NEGATIVE 04/22/2017 1431   HGBUR SMALL (A) 03/29/2023 1850   BILIRUBINUR NEGATIVE 03/29/2023 1850   KETONESUR NEGATIVE 03/29/2023 1850   PROTEINUR NEGATIVE 03/29/2023 1850   UROBILINOGEN 1.0 04/22/2017 1431   NITRITE NEGATIVE 03/29/2023 1850   LEUKOCYTESUR NEGATIVE 03/29/2023 1850   Sepsis Labs: @LABRCNTIP (procalcitonin:4,lacticidven:4)  ) Recent Results (from the past 240 hours)  Surgical pcr screen     Status: None   Collection Time: 04/01/23  4:33 PM   Specimen: Nasal Mucosa; Nasal Swab  Result Value Ref Range Status   MRSA, PCR NEGATIVE NEGATIVE Final   Staphylococcus aureus NEGATIVE NEGATIVE Final    Comment: (NOTE) The Xpert SA Assay (FDA approved for NASAL specimens in patients 49 years of age and older), is one component of a comprehensive surveillance program. It is not intended to diagnose infection nor to guide or monitor treatment. Performed at [facility], [address], [place], [place] [address]      Radiology Studies: EP PPM/ICD IMPLANT Result Date: 04/02/2023  CONCLUSIONS:  1.  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  Permanent atrial fibrillation  3.  Successful implant of single-chamber permanent pacemaker  4.  No early apparent complications.  5.  Hold  Coumadin for 5 days (okay to restart April 08, 2023)     Scheduled Meds:  amitriptyline  10 mg Oral QHS   atorvastatin  10 mg Oral Daily   colestipol  0.5 g Oral Daily   sodium chloride 0.9 % with gentamicin (GARAMYCIN) ADS Med       sodium chloride flush  3 mL Intravenous Q12H   Continuous Infusions:  ceFAZolin       LOS: 4 days    Time spent:    Zannie Cove, MD [facility]   04/02/2023, 4:46 PM

## 2023-04-03 ENCOUNTER — Inpatient Hospital Stay (HOSPITAL_COMMUNITY): Payer: PPO

## 2023-04-03 ENCOUNTER — Ambulatory Visit: Payer: PPO | Admitting: Gastroenterology

## 2023-04-03 ENCOUNTER — Institutional Professional Consult (permissible substitution): Payer: PPO | Admitting: Medical

## 2023-04-03 DIAGNOSIS — I442 Atrioventricular block, complete: Secondary | ICD-10-CM | POA: Diagnosis not present

## 2023-04-03 DIAGNOSIS — I5023 Acute on chronic systolic (congestive) heart failure: Secondary | ICD-10-CM | POA: Diagnosis not present

## 2023-04-03 LAB — PROTIME-INR
INR: 5.7 (ref 0.8–1.2)
INR: 1.6 — ABNORMAL HIGH (ref 0.8–1.2)
Prothrombin Time: 51.7 s — ABNORMAL HIGH (ref 11.4–15.2)
Prothrombin Time: 19.4 s — ABNORMAL HIGH (ref 11.4–15.2)

## 2023-04-03 LAB — BASIC METABOLIC PANEL
Anion gap: 12 (ref 5–15)
BUN: 24 mg/dL — ABNORMAL HIGH (ref 8–23)
CO2: 27 mmol/L (ref 22–32)
Calcium: 9 mg/dL (ref 8.9–10.3)
Chloride: 99 mmol/L (ref 98–111)
Creatinine, Ser: 1.41 mg/dL — ABNORMAL HIGH (ref 0.61–1.24)
GFR, Estimated: 50 mL/min — ABNORMAL LOW (ref >60)
Glucose, Bld: 92 mg/dL (ref 70–99)
Potassium: 4.2 mmol/L (ref 3.5–5.1)
Sodium: 138 mmol/L (ref 135–145)

## 2023-04-03 MED ORDER — METOPROLOL SUCCINATE ER 25 MG PO TB24: 12.5000 mg | ORAL_TABLET | Freq: Every day | ORAL | Status: DC

## 2023-04-03 MED FILL — Gentamicin Sulfate Inj 40 MG/ML: INTRAMUSCULAR | Qty: 80 | Status: AC

## 2023-04-03 NOTE — Care Management Important Message (Signed)
Important Message  Patient Details  Name: Jason Day MRN: [id number] Date of Birth: [birth year]   Important Message Given:  Yes - Medicare IM     Renie Ora 04/03/2023, 10:06 AM

## 2023-04-03 NOTE — TOC Transition Note (Signed)
Transition of Care [facility]) - Discharge Note   Patient Details  Name: Jason Day MRN: [id number] Date of Birth: [birth year]  [facility] CM/SW Contact:  Leone Haven, RN Phone Number: 04/03/2023, 11:15 AM   Clinical Narrative:     For dc today, wife will transport patient home today.      Barriers to Discharge: Continued Medical Work up   Patient Goals and CMS Choice Patient states their goals for this hospitalization and ongoing recovery are:: return home   Choice offered to / list presented to : NA      Discharge Placement                       Discharge Plan and Services Additional resources added to the After Visit Summary for   In-house Referral: NA Discharge Planning Services: CM Consult Post Acute Care Choice: NA          DME Arranged: N/A DME Agency: NA       HH Arranged: NA          Social Drivers of Health (SDOH) Interventions SDOH Screenings   Food Insecurity: No Food Insecurity (03/30/2023)  Housing: Low Risk  (03/30/2023)  Transportation Needs: No Transportation Needs (03/30/2023)  Utilities: Not At Risk (03/30/2023)  Alcohol Screen: Low Risk  (12/04/2022)  Depression (PHQ2-9): Low Risk  (02/28/2023)  Financial Resource Strain: Low Risk  (12/04/2022)  Physical Activity: Insufficiently Active (12/04/2022)  Social Connections: Moderately Isolated (03/30/2023)  Stress: No Stress Concern Present (12/04/2022)  Tobacco Use: Medium Risk (03/29/2023)  Health Literacy: Adequate Health Literacy (12/04/2022)     Readmission Risk Interventions     No data to display

## 2023-04-03 NOTE — Progress Notes (Signed)
Mobility Specialist Progress Note:    04/03/23 1121  Mobility  Activity Ambulated with assistance in hallway  Level of Assistance Standby assist, set-up cues, supervision of patient - no hands on  Assistive Device None  Distance Ambulated (ft) 1000 ft  Activity Response Tolerated well  Mobility Referral Yes  Mobility visit 1 Mobility  Mobility Specialist Start Time (ACUTE ONLY) 0940  Mobility Specialist Stop Time (ACUTE ONLY) [id number]  Mobility Specialist Time Calculation (min) (ACUTE ONLY) 12 min   Received pt in bed having no complaints and agreeable to mobility. Pt was asymptomatic throughout ambulation and returned to room w/o fault. Left in bed w/ call bell in reach and all needs met.   Thompson Grayer Mobility Specialist  Please contact vis Secure Chat or  Rehab Office [phone]

## 2023-04-03 NOTE — Progress Notes (Deleted)
 HPI :    Past Medical History:  Diagnosis Date   Acute combined systolic and diastolic congestive heart failure (HCC) 10/16/2016   Allergic rhinitis 09/01/2020   Anal fissure    Angio-edema    Bleeding hemorrhoids    Chronic combined systolic and diastolic heart failure (HCC)    Chronic sinus complaints    overuses afrin   Coronary atherosclerosis of native coronary artery    Dyspnea    Encounter for medical examination to establish care 04/22/2017   Gallstones    Hyperlipidemia    Irritable bowel syndrome 06/07/2017   Ischemic cardiomyopathy    Left atrial enlargement    Long term (current) use of anticoagulants 12/30/2020   Moderate mitral regurgitation    Myocardial infarction (HCC) 09/2015   Persistent atrial fibrillation (HCC)    RBBB    A- Fib   Shortness of breath 12/29/2020   Urinary incontinence 12/30/2020     Past Surgical History:  Procedure Laterality Date   BACK SURGERY  ~2014   disc repair   CARDIOVERSION N/A 10/24/2018   Procedure: CARDIOVERSION;  Surgeon: Jake Bathe, MD;  Location: [facility] ENDOSCOPY;  Service: Cardiovascular;  Laterality: N/A;   CHOLECYSTECTOMY     COLONOSCOPY  04/06/2014   Hemorrhoids and diverticulosis performed in [place]   COLONOSCOPY WITH PROPOFOL N/A 08/04/2019   Procedure: COLONOSCOPY WITH PROPOFOL;  Surgeon: Graylin Shiver, MD;  Location: [facility] ENDOSCOPY;  Service: Endoscopy;  Laterality: N/A;   CORONARY ARTERY BYPASS GRAFT N/A 10/18/2016   Procedure: CORONARY ARTERY BYPASS GRAFTING (CABG) x five , using left internal mammary artery and right leg greater saphenous vein harvested endoscopically;  Surgeon: Alleen Borne, MD;  Location: [facility] OR;  Service: Open Heart Surgery;  Laterality: N/A;   ESOPHAGOGASTRODUODENOSCOPY (EGD) WITH PROPOFOL N/A 08/04/2019   Procedure: ESOPHAGOGASTRODUODENOSCOPY (EGD) WITH PROPOFOL;  Surgeon: Graylin Shiver, MD;  Location: [facility] ENDOSCOPY;  Service: Endoscopy;  Laterality: N/A;   FOOT SURGERY     HEMORRHOID  BANDING     HEMORRHOID SURGERY     PACEMAKER IMPLANT N/A 04/02/2023   Procedure: PACEMAKER IMPLANT;  Surgeon: Lanier Prude, MD;  Location: [facility] INVASIVE CV LAB;  Service: Cardiovascular;  Laterality: N/A;   REVERSE SHOULDER ARTHROPLASTY Left 08/21/2018   Procedure: REVERSE SHOULDER ARTHROPLASTY;  Surgeon: Francena Hanly, MD;  Location: [facility] ORS;  Service: Orthopedics;  Laterality: Left;   RIGHT/LEFT HEART CATH AND CORONARY ANGIOGRAPHY N/A 10/16/2016   Procedure: RIGHT/LEFT HEART CATH AND CORONARY ANGIOGRAPHY;  Surgeon: Lyn Records, MD;  Location: [facility] INVASIVE CV LAB;  Service: Cardiovascular;  Laterality: N/A;   stents in liver     TEE WITHOUT CARDIOVERSION N/A 10/18/2016   Procedure: TRANSESOPHAGEAL ECHOCARDIOGRAM (TEE);  Surgeon: Alleen Borne, MD;  Location: [facility] OR;  Service: Open Heart Surgery;  Laterality: N/A;   TRANSURETHRAL RESECTION OF PROSTATE N/A 11/19/2019   Procedure: CYSTOSCOPY TRANSURETHRAL RESECTION OF THE PROSTATE (TURP);  Surgeon: Bjorn Pippin, MD;  Location: [facility] ORS;  Service: Urology;  Laterality: N/A;   Family History  Problem Relation Age of Onset   COPD Father    Emphysema Father    Healthy Brother    Diabetes Neg Hx    Cancer Neg Hx    Stomach cancer Neg Hx    Colon cancer Neg Hx    Allergic rhinitis Neg Hx    Angioedema Neg Hx    Asthma Neg Hx    Eczema Neg Hx    Urticaria Neg Hx  Social History   Tobacco Use   Smoking status: Former    Current packs/day: 0.00    Types: Cigarettes    Quit date: 1970    Years since quitting: 55.1    Passive exposure: Never   Smokeless tobacco: Never   Tobacco comments:    Quit in 1970  Vaping Use   Vaping status: Never Used  Substance Use Topics   Alcohol use: No   Drug use: No   No current facility-administered medications for this visit.   No current outpatient medications on file.   Facility-Administered Medications Ordered in Other Visits  Medication Dose Route Frequency Provider Last Rate Last Admin    acetaminophen (TYLENOL) tablet 650 mg  650 mg Oral Q6H PRN Howerter, Justin B, DO       Or   acetaminophen (TYLENOL) suppository 650 mg  650 mg Rectal Q6H PRN Howerter, Justin B, DO       amitriptyline (ELAVIL) tablet 10 mg  10 mg Oral QHS Arrien, York Ram, MD   10 mg at 03/30/23 2119   atorvastatin (LIPITOR) tablet 10 mg  10 mg Oral Daily Howerter, Justin B, DO       colestipol (COLESTID) tablet 0.5 g  0.5 g Oral Daily Howerter, Justin B, DO       melatonin tablet 3 mg  3 mg Oral QHS PRN Howerter, Justin B, DO       ondansetron (ZOFRAN) injection 4 mg  4 mg Intravenous Q6H PRN Howerter, Justin B, DO       sodium chloride flush (NS) 0.9 % injection 3 mL  3 mL Intravenous Q12H Sheilah Pigeon, PA-C   3 mL at 04/02/23 2131   No Known Allergies   Review of Systems: All systems reviewed and negative except where noted in HPI.    DG Chest 2 View Result Date: 04/03/2023 CLINICAL DATA:  Status post pacemaker placement EXAM: CHEST - 2 VIEW COMPARISON:  03/29/2023 FINDINGS: Cardiac shadow is enlarged. Postsurgical changes are noted. Single lead pacing device is now noted. No pneumothorax is seen. Lungs are clear. No acute bony abnormality is noted. IMPRESSION: No pneumothorax following pacemaker placement. Electronically Signed   By: Alcide Clever M.D.   On: 04/03/2023 10:03   EP PPM/ICD IMPLANT Result Date: 04/02/2023  CONCLUSIONS:  1.  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  Permanent atrial fibrillation  3.  Successful implant of single-chamber permanent pacemaker  4.  No early apparent complications.  5.  Hold Coumadin for 5 days (okay to restart April 08, 2023)   ECHOCARDIOGRAM COMPLETE Result Date: 03/30/2023    ECHOCARDIOGRAM REPORT   Patient Name:   Jason Day   Date of Exam: 03/30/2023 Medical Rec #:  [id number]  Height:       72.0 in Accession #:    [id number] Weight:       149.7 lb Date of Birth:  07/02/1940  BSA:          1.884 m Patient Age:    82 years   BP:            124/62 mmHg Patient Gender: M          HR:           41 bpm. Exam Location:  Inpatient Procedure: 2D Echo, Color Doppler and Cardiac Doppler Indications:    acute systolic chf  History:        Patient has prior history of Echocardiogram examinations, most  recent 03/08/2022. CAD, Arrythmias:Atrial Fibrillation; Risk                 Factors:Dyslipidemia.  Sonographer:    Delcie Roch RDCS Referring Phys: [id number] JUSTIN B HOWERTER IMPRESSIONS  1. Left ventricular ejection fraction, by estimation, is 45 to 50%. The left ventricle has mildly decreased function. The left ventricle demonstrates regional wall motion abnormalities (see scoring diagram/findings for description). 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 The left ventricular internal cavity size was mildly dilated. There is moderate asymmetric left ventricular hypertrophy of the basal segment. Left ventricular diastolic parameters are indeterminate.  2. Right ventricular systolic function is low normal. The right ventricular size is normal. There is normal pulmonary artery systolic pressure. The estimated right ventricular systolic pressure is 35.3 mmHg.  3. Left atrial size was severely dilated.  4. Right atrial size was moderately dilated.  5. The mitral valve is degenerative. Moderate mitral valve regurgitation.  6. Tricuspid valve regurgitation is mild to moderate.  7. The aortic valve is tricuspid. There is moderate calcification of the aortic valve. Aortic valve regurgitation is not visualized.  8. Aortic dilatation noted. There is mild dilatation of the ascending aorta, measuring 39 mm.  9. The inferior vena cava is normal in size with greater than 50% respiratory variability, suggesting right atrial pressure of 3 mmHg. Comparison(s): Prior images reviewed side by side. LVEF has improved relative to the prior study, now 45-50% range with similar wall motion abnormalities including basal inferior aneurysmal  segment. FINDINGS  Left Ventricle: Left ventricular ejection fraction, by estimation, is 45 to 50%. The left ventricle has mildly decreased function. The left ventricle demonstrates regional wall motion abnormalities. The left ventricular internal cavity size was mildly dilated. There is moderate asymmetric left ventricular hypertrophy of the basal segment. Left ventricular diastolic function could not be evaluated due to atrial fibrillation. Left ventricular diastolic parameters are indeterminate.  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 The basal inferolateral segment and mid inferior segment are hypokinetic. The entire anterior wall, antero-lateral wall, mid and distal lateral wall, entire septum, and entire apex are normal. Right Ventricle: The right ventricular size is normal. No increase in right ventricular wall thickness. Right ventricular systolic function is low normal. There is normal pulmonary artery systolic pressure. The tricuspid regurgitant velocity is 2.84 m/s,  and with an assumed right atrial pressure of 3 mmHg, the estimated right ventricular systolic pressure is 35.3 mmHg. Left Atrium: Left atrial size was severely dilated. Right Atrium: Right atrial size was moderately dilated. Pericardium: Trivial pericardial effusion is present. The pericardial effusion is posterior to the left ventricle. Mitral Valve: The mitral valve is degenerative in appearance. There is mild thickening of the mitral valve leaflet(s). Mild mitral annular calcification. Moderate mitral valve regurgitation. Tricuspid Valve: The tricuspid valve is grossly normal. Tricuspid valve regurgitation is mild to moderate. Aortic Valve: The aortic valve is tricuspid. There is moderate calcification of the aortic valve. There is mild aortic valve annular calcification. Aortic valve regurgitation is not visualized. Pulmonic Valve: The pulmonic valve was grossly normal. Pulmonic valve regurgitation is mild. Aorta:  Aortic dilatation noted. There is mild dilatation of the ascending aorta, measuring 39 mm. Venous: The inferior vena cava is normal in size with greater than 50% respiratory variability, suggesting right atrial pressure of 3 mmHg. IAS/Shunts: No atrial level shunt detected by color flow Doppler.  LEFT VENTRICLE PLAX 2D LVIDd:         6.50 cm LVIDs:  5.50 cm LV PW:         1.30 cm LV IVS:        1.40 cm LVOT diam:     2.40 cm LV SV:         88 LV SV Index:   47 LVOT Area:     4.52 cm  RIGHT VENTRICLE             IVC RV Basal diam:  3.30 cm     IVC diam: 2.00 cm RV S prime:     10.80 cm/s LEFT ATRIUM              Index        RIGHT ATRIUM           Index LA diam:        5.20 cm  2.76 cm/m   RA Area:     25.00 cm LA Vol (A2C):   140.2 ml 74.43 ml/m  RA Volume:   83.60 ml  44.38 ml/m LA Vol (A4C):   159.0 ml 84.41 ml/m LA Biplane Vol: 175.0 ml 92.90 ml/m  AORTIC VALVE LVOT Vmax:   94.00 cm/s LVOT Vmean:  60.200 cm/s LVOT VTI:    0.194 m  AORTA Ao Root diam: 3.30 cm Ao Asc diam:  3.90 cm TRICUSPID VALVE TR Peak grad:   32.3 mmHg TR Vmax:        284.00 cm/s  SHUNTS Systemic VTI:  0.19 m Systemic Diam: 2.40 cm Nona Dell MD Electronically signed by Nona Dell MD Signature Date/Time: 03/30/2023/2:46:59 PM    Final    DG Chest Portable 1 View Result Date: 03/29/2023 CLINICAL DATA:  Dyspnea EXAM: PORTABLE CHEST 1 VIEW COMPARISON:  Chest radiograph dated 02/11/2022 FINDINGS: Normal lung volumes. Mild bilateral interstitial patchy opacities. No pleural effusion or pneumothorax. Similar enlarged cardiomediastinal silhouette. Left shoulder arthroplasty. Median sternotomy wires are nondisplaced. IMPRESSION: 1. Mild bilateral interstitial and patchy opacities, which may represent pulmonary edema. 2. Similar cardiomegaly. Electronically Signed   By: Agustin Cree M.D.   On: 03/29/2023 15:25    Physical Exam: There were no vitals taken for this visit. Constitutional: Pleasant,well-developed, ***male in no  acute distress. HEENT: Normocephalic and atraumatic. Conjunctivae are normal. No scleral icterus. Neck supple.  Cardiovascular: Normal rate, regular rhythm.  Pulmonary/chest: Effort normal and breath sounds normal. No wheezing, rales or rhonchi. Abdominal: Soft, nondistended, nontender. Bowel sounds active throughout. There are no masses palpable. No hepatomegaly. Extremities: no edema Lymphadenopathy: No cervical adenopathy noted. Neurological: Alert and oriented to person place and time. Skin: Skin is warm and dry. No rashes noted. Psychiatric: Normal mood and affect. Behavior is normal.  CBC    Component Value Date/Time   WBC 6.1 04/01/2023 0801   RBC 4.53 04/01/2023 0801   HGB 14.1 04/01/2023 0801   HGB 14.7 10/12/2021 1027   HCT 41.5 04/01/2023 0801   HCT 43.9 10/12/2021 1027   PLT 138 (L) 04/01/2023 0801   PLT 110 (L) 10/12/2021 1027   MCV 91.6 04/01/2023 0801   MCV 93 10/12/2021 1027   MCH 31.1 04/01/2023 0801   MCHC 34.0 04/01/2023 0801   RDW 14.1 04/01/2023 0801   RDW 14.4 10/12/2021 1027   LYMPHSABS 1.2 03/30/2023 0222   LYMPHSABS 1.0 10/12/2021 1027   MONOABS 0.4 03/30/2023 0222   EOSABS 0.1 03/30/2023 0222   EOSABS 0.1 10/12/2021 1027   BASOSABS 0.0 03/30/2023 0222   BASOSABS 0.0 10/12/2021 1027    CMP  Component Value Date/Time   NA 138 04/03/2023 0220   NA 142 02/21/2022 0957   K 4.2 04/03/2023 0220   CL 99 04/03/2023 0220   CO2 27 04/03/2023 0220   GLUCOSE 92 04/03/2023 0220   BUN 24 (H) 04/03/2023 0220   BUN 20 02/21/2022 0957   CREATININE 1.41 (H) 04/03/2023 0220   CALCIUM 9.0 04/03/2023 0220   PROT 6.3 (L) 03/30/2023 0222   PROT 6.9 10/12/2021 1027   ALBUMIN 3.6 03/30/2023 0222   ALBUMIN 4.5 10/12/2021 1027   AST 18 03/30/2023 0222   ALT 17 03/30/2023 0222   ALKPHOS 91 03/30/2023 0222   BILITOT 2.3 (H) 03/30/2023 0222   BILITOT 2.2 (H) 10/12/2021 1027   GFRNONAA 50 (L) 04/03/2023 0220   GFRAA >60 11/10/2019 1050       Latest Ref  Rng & Units 04/01/2023    8:01 AM 03/30/2023    2:22 AM 03/29/2023    2:13 PM  CBC EXTENDED  WBC 4.0 - 10.5 K/uL 6.1  4.5    RBC 4.22 - 5.81 MIL/uL 4.53  4.16    Hemoglobin 13.0 - 17.0 g/dL 56.8  12.7  51.7   HCT 39.0 - 52.0 % 41.5  38.8  38.0   Platelets 150 - 400 K/uL 138  102    NEUT# 1.7 - 7.7 K/uL  2.7    Lymph# 0.7 - 4.0 K/uL  1.2        ASSESSMENT AND PLAN:  Jac Canavan, PA-C

## 2023-04-03 NOTE — Progress Notes (Addendum)
Rounding Note    Patient Name: Jason Day Date of Encounter: 04/03/2023  [name] Cardiologist: Charlton Haws, MD   Subjective   Rested, slept well,  no CP, SOB  Inpatient Medications    Scheduled Meds:  amitriptyline  10 mg Oral QHS   atorvastatin  10 mg Oral Daily   colestipol  0.5 g Oral Daily   sodium chloride flush  3 mL Intravenous Q12H   Continuous Infusions:   PRN Meds: acetaminophen **OR** acetaminophen, melatonin, ondansetron (ZOFRAN) IV   Vital Signs    Vitals:   04/02/23 1357 04/02/23 1931 04/03/23 0653 04/03/23 0750  BP: (!) 106/59 124/66 (!) 105/59 119/70  Pulse: 63 72 70 70  Resp: 18 18 18 18   Temp: 98.4 F (36.9 C) 98.3 F (36.8 C) 98.3 F (36.8 C) 97.8 F (36.6 C)  TempSrc: Oral Oral Oral   SpO2: 100% 100% 100%   Weight:      Height:        Intake/Output Summary (Last 24 hours) at 04/03/2023 1030 Last data filed at 04/03/2023 0900 Gross per 24 hour  Intake 418.01 ml  Output 300 ml  Net 118.01 ml      04/02/2023    6:27 AM 04/01/2023    4:09 AM 03/31/2023    4:28 AM  Last 3 Weights  Weight (lbs) 150 lb 3.2 oz 148 lb 14.4 oz 147 lb 12.8 oz  Weight (kg) 68.13 kg 67.541 kg 67.042 kg      Telemetry    AFib, VPaced , rare and brief NSVT, (known for him)  - Personally Reviewed  ECG    AFib, V paced, PVC - Personally Reviewed  Physical Exam   GEN: No acute distress.   Neck: No JVD Cardiac: RRR, (paced), no murmurs, rubs, or gallops.  Respiratory: CTA b/l. GI: Soft, nontender, non-distended  MS: No edema; No deformity. Neuro:  Nonfocal  Psych: Normal affect   PACER site: pressure dressing is in place, no signs of hematoma, bleeding, drainage  Labs    High Sensitivity Troponin:   Recent Labs  Lab 03/29/23 1349 03/29/23 1550 03/30/23 0733  TROPONINIHS 94* 110* 145*     Chemistry Recent Labs  Lab 03/29/23 1349 03/29/23 1413 03/30/23 0222 03/31/23 1308 04/01/23 0235 04/03/23 0220  NA 140   < > 139 139 140  138  K 4.9   < > 4.4 3.9 4.4 4.2  CL 106   < > 105 101 102 99  CO2 25  --  25 28 27 27   GLUCOSE 101*   < > 88 110* 88 92  BUN 25*   < > 24* 26* 30* 24*  CREATININE 1.54*   < > 1.63* 1.68* 1.68* 1.41*  CALCIUM 9.0  --  8.9 8.6* 8.8* 9.0  MG 2.2  --  2.1  --  2.0  --   PROT 6.5  --  6.3*  --   --   --   ALBUMIN 3.7  --  3.6  --   --   --   AST 20  --  18  --   --   --   ALT 20  --  17  --   --   --   ALKPHOS 88  --  91  --   --   --   BILITOT 2.2*  --  2.3*  --   --   --   GFRNONAA 45*  --  42* 40* 40* 50*  ANIONGAP 9  --  9 10 11 12    < > = values in this interval not displayed.    Lipids No results for input(s): "CHOL", "TRIG", "HDL", "LABVLDL", "LDLCALC", "CHOLHDL" in the last 168 hours.  Hematology Recent Labs  Lab 03/29/23 1349 03/29/23 1413 03/30/23 0222 04/01/23 0801  WBC 5.2  --  4.5 6.1  RBC 4.19*  --  4.16* 4.53  HGB 12.9* 12.9* 12.8* 14.1  HCT 39.0 38.0* 38.8* 41.5  MCV 93.1  --  93.3 91.6  MCH 30.8  --  30.8 31.1  MCHC 33.1  --  33.0 34.0  RDW 14.3  --  14.1 14.1  PLT 102*  --  102* 138*   Thyroid No results for input(s): "TSH", "FREET4" in the last 168 hours.  BNP Recent Labs  Lab 03/29/23 1350 03/30/23 0222  BNP 1,287.3* 1,485.4*    DDimer No results for input(s): "DDIMER" in the last 168 hours.   Radiology    DG Chest 2 View Result Date: 04/03/2023 CLINICAL DATA:  Status post pacemaker placement EXAM: CHEST - 2 VIEW COMPARISON:  03/29/2023 FINDINGS: Cardiac shadow is enlarged. Postsurgical changes are noted. Single lead pacing device is now noted. No pneumothorax is seen. Lungs are clear. No acute bony abnormality is noted. IMPRESSION: No pneumothorax following pacemaker placement. Electronically Signed   By: Alcide Clever M.D.   On: 04/03/2023 10:03     Cardiac Studies   Echocardiogram 03/30/2023:  1. Left ventricular ejection fraction, by estimation, is 45 to 50%. The  left ventricle has mildly decreased function. The left ventricle  demonstrates  regional wall motion abnormalities (see scoring  diagram/findings for description). Basal inferior  aneurysmal segment also present on prior study. The left ventricular  internal cavity size was mildly dilated. There is moderate asymmetric left  ventricular hypertrophy of the basal segment. Left ventricular diastolic  parameters are indeterminate.   2. Right ventricular systolic function is low normal. The right  ventricular size is normal. There is normal pulmonary artery systolic  pressure. The estimated right ventricular systolic pressure is 35.3 mmHg.   3. Left atrial size was severely dilated.   4. Right atrial size was moderately dilated.   5. The mitral valve is degenerative. Moderate mitral valve regurgitation.   6. Tricuspid valve regurgitation is mild to moderate.   7. The aortic valve is tricuspid. There is moderate calcification of the  aortic valve. Aortic valve regurgitation is not visualized.   8. Aortic dilatation noted. There is mild dilatation of the ascending  aorta, measuring 39 mm.   9. The inferior vena cava is normal in size with greater than 50%  respiratory variability, suggesting right atrial pressure of 3 mmHg.      - Echo (1/19): EF 35% - Echo (2020): EF 30-35% - Echo (8/22): EF 30-35%, moderate-severe MR - Echo (1/24): EF 25-30%, moderate LV dilation, severely decreased RV systolic function, severe LAE, moderate MR.  4. 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  Moderate-severe on 8/22 echo but moderate on 1/24 echo.     Patient Profile     83 y.o. male w/PMHx of CAD (CABG 2018), ICM, chronic CHF,  HLD, RBBB, AFib (permanent) admitted with bradycardia, and GI issues (chronic), and some mentions of hematuria.  Toprol stopped on admission, none given here > remains markedly bradycardic  Assessment & Plan    AFib w/SVR CHA2DS2Vasc is 4, on warfarin > tox on admission (pt denies hematuria, UA fairly unremarkable) INR TODAY is 1.6  Marked  bradycardia continues despite no nodal blocking agent now for days Pt comfortable and asymptomatic , BP stable HRs dipping to 27-29, largely 30's Evidence of CHB with regular R-R  Now s/p PPM implant yesterday Device interrogation this morning with stable measurements  CXR this morning with no PTX Pressure dressing in place, stable without evidence of bleeding  No warfarin yet  Will see the office device clinic RN tomorrow to remove the pressure bandage > if site is stable > plan instructions for resuming warfarin Coumadin clinic visit is arranged for nest week  Usual follow up otherwise is in place with EP Site care and instructions discussed with the patient and placed in his AVS  OK to resume metoprolol, cardiac meds (discussed with patient)    Otherwise Care as per IM team GI issues Creat better  For questions or updates, please contact [facility] Please consult [email or web address] for contact info under        Signed, Sheilah Pigeon, PA-C  04/03/2023, 10:30 AM

## 2023-04-03 NOTE — Discharge Summary (Signed)
Physician Discharge Summary  Jason Day ZOX:[id number] DOB: 03-Mar-1940 DOA: 03/29/2023  PCP: Jac Canavan, PA-C  Admit date: 03/29/2023 Discharge date: 04/03/2023  Time spent: 45 minutes  Recommendations for Outpatient Follow-up:  EP follow-up in 1 week, resume warfarin at follow-up Cardiology Dr.Nishan in 2-3weeks   Discharge Diagnoses:  Principal Problem:   Acute on chronic systolic (congestive) heart failure (HCC) Complete heart block Symptomatic bradycardia   Permanent atrial fibrillation (HCC)   CKD stage 3a, GFR 45-59 ml/min (HCC)   Hyperlipidemia   Coronary artery disease   Depression, major, single episode, mild (HCC)   Bowel incontinence   Discharge Condition: Improved  Diet recommendation: Low-sodium, heart healthy  Filed Weights   03/31/23 0428 04/01/23 0409 04/02/23 0627  Weight: 67 kg 67.5 kg 68.1 kg    History of present illness:  83 yo male with the past medical history of heart failure, atrial fibrillation, coronary artery disease, CKD 3a, who presented with dyspnea. Reported 3 days of worsening dyspnea and lower extremity edema. On his initial physical examination his heart rate was 40 to 60, blood pressure 136/72, 99/53, 83/45, HR 38 to 61, 02 saturation 99%, heart with S1 and S2 present, irregularly irregular, lungs with no wheezing or rales with no gallops, abdomen with no distention, positive lower extremity edema.   Hospital Course:   Complete heart block Symptomatic sinus bradycardia -EP consulted, beta-blockers held -Warfarin held and underwent pacemaker implantation yesterday -Seen by EP and cleared for discharge, recommended to hold warfarin until clinic follow-up next week, okay to resume metoprolol per EP  Acute on chronic systolic CHF -Last echo with improvement in EF to 45-50%, chronic basal inferior aneurysmal segment, low normal RV -Diuresed with IV Lasix initially and then held, complicated by hypotension -Now improved and stable after  PPM, Aldactone and Farxiga resumed -Restart Entresto at follow-up if blood pressure tolerates   Permanent atrial fibrillation (HCC) -See discussion above, warfarin on hold at discharge until PPM follow-up next week   CKD stage 3a, GFR 45-59 ml/min (HCC) -Stable   Hyperlipidemia Continue with statin therapy.    Coronary artery disease S/p CABG in 2018  High sensitive troponin elevation due to heart failure exacerbation.  Ruled out for acute coronary syndrome.  -Continue statin   Depression, major, single episode, mild (HCC) Continue with amitriptyline.        Discharge Exam: Vitals:   04/03/23 0653 04/03/23 0750  BP: (!) 105/59 119/70  Pulse: 70 70  Resp: 18 18  Temp: 98.3 F (36.8 C) 97.8 F (36.6 C)  SpO2: 100%    Gen: Awake, Alert, Oriented X 3,  HEENT: no JVD, pacer site dressing Lungs: Good air movement bilaterally, CTAB CVS: S1S2/RRR Abd: soft, Non tender, non distended, BS present Extremities: No edema Skin: no new rashes on exposed skin   Discharge Instructions   Discharge Instructions     Ambulatory referral to Cardiology   Complete by: As directed    If you have not heard from the Cardiology office within the next 72 hours please call [phone].   Diet - low sodium heart healthy   Complete by: As directed    Discharge instructions   Complete by: As directed    Please follow up with primary care in 7 to 10 days. Follow up with cardiology as scheduled.   Increase activity slowly   Complete by: As directed    No wound care   Complete by: As directed       Allergies as  of 04/03/2023   No Known Allergies      Medication List     STOP taking these medications    Entresto 49-51 MG Generic drug: sacubitril-valsartan   warfarin 5 MG tablet Commonly known as: COUMADIN       TAKE these medications    amitriptyline 10 MG tablet Commonly known as: ELAVIL Take 1 tablet (10 mg total) by mouth at bedtime.   atorvastatin 10 MG  tablet Commonly known as: LIPITOR Take 1 tablet (10 mg total) by mouth daily.   colestipol 1 g tablet Commonly known as: COLESTID TAKE 1TAB BY MOUTH 2TIMES DAILY*TAKE THIS MEDICATION AT LEAST 1HR AFTER TAKING YOUR OTHER MEDS. What changed: See the new instructions.   Farxiga 10 MG Tabs tablet Generic drug: dapagliflozin propanediol Take 1 tablet (10 mg total) by mouth daily before breakfast.   GAS-X PO Take 1 tablet by mouth daily as needed.   metoprolol succinate 25 MG 24 hr tablet Commonly known as: TOPROL-XL Take 0.5 tablets (12.5 mg total) by mouth daily. Take 12.5mg  daily What changed:  how much to take Another medication with the same name was removed. Continue taking this medication, and follow the directions you see here.   spironolactone 25 MG tablet Commonly known as: ALDACTONE Take 1 tablet (25 mg total) by mouth daily.       No Known Allergies  Follow-up Information     Jac Canavan, PA-C. Call in 3 days.   Specialty: Family Medicine Contact information: [address] [place] [address] [phone]         [facility] Emergency Department at [facility].   Specialty: Emergency Medicine Why: If symptoms worsen Contact information: [address] [place] [address] [phone]                 The results of significant diagnostics from this hospitalization (including imaging, microbiology, ancillary and laboratory) are listed below for reference.    Significant Diagnostic Studies: DG Chest 2 View Result Date: 04/03/2023 CLINICAL DATA:  Status post pacemaker placement EXAM: CHEST - 2 VIEW COMPARISON:  03/29/2023 FINDINGS: Cardiac shadow is enlarged. Postsurgical changes are noted. Single lead pacing device is now noted. No pneumothorax is seen. Lungs are clear. No acute bony abnormality is noted. IMPRESSION: No pneumothorax following pacemaker placement. Electronically Signed   By: Alcide Clever M.D.    On: 04/03/2023 10:03   EP PPM/ICD IMPLANT Result Date: 04/02/2023  CONCLUSIONS:  1.  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  Permanent atrial fibrillation  3.  Successful implant of single-chamber permanent pacemaker  4.  No early apparent complications.  5.  Hold Coumadin for 5 days (okay to restart April 08, 2023)   ECHOCARDIOGRAM COMPLETE Result Date: 03/30/2023    ECHOCARDIOGRAM REPORT   Patient Name:   Jason Day   Date of Exam: 03/30/2023 Medical Rec #:  [id number]  Height:       72.0 in Accession #:    [id number] Weight:       149.7 lb Date of Birth:  [birth year]  BSA:          1.884 m Patient Age:    82 years   BP:           124/62 mmHg Patient Gender: M          HR:           41 bpm. Exam Location:  Inpatient Procedure: 2D Echo, Color Doppler  and Cardiac Doppler Indications:    acute systolic chf  History:        Patient has prior history of Echocardiogram examinations, most                 recent 03/08/2022. CAD, Arrythmias:Atrial Fibrillation; Risk                 Factors:Dyslipidemia.  Sonographer:    Delcie Roch RDCS Referring Phys: [id number] JUSTIN B HOWERTER IMPRESSIONS  1. Left ventricular ejection fraction, by estimation, is 45 to 50%. The left ventricle has mildly decreased function. The left ventricle demonstrates regional wall motion abnormalities (see scoring diagram/findings for description). 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 The left ventricular internal cavity size was mildly dilated. There is moderate asymmetric left ventricular hypertrophy of the basal segment. Left ventricular diastolic parameters are indeterminate.  2. Right ventricular systolic function is low normal. The right ventricular size is normal. There is normal pulmonary artery systolic pressure. The estimated right ventricular systolic pressure is 35.3 mmHg.  3. Left atrial size was severely dilated.  4. Right atrial size was moderately dilated.  5. The mitral valve is  degenerative. Moderate mitral valve regurgitation.  6. Tricuspid valve regurgitation is mild to moderate.  7. The aortic valve is tricuspid. There is moderate calcification of the aortic valve. Aortic valve regurgitation is not visualized.  8. Aortic dilatation noted. There is mild dilatation of the ascending aorta, measuring 39 mm.  9. The inferior vena cava is normal in size with greater than 50% respiratory variability, suggesting right atrial pressure of 3 mmHg. Comparison(s): Prior images reviewed side by side. LVEF has improved relative to the prior study, now 45-50% range with similar wall motion abnormalities including basal inferior aneurysmal segment. FINDINGS  Left Ventricle: Left ventricular ejection fraction, by estimation, is 45 to 50%. The left ventricle has mildly decreased function. The left ventricle demonstrates regional wall motion abnormalities. The left ventricular internal cavity size was mildly dilated. There is moderate asymmetric left ventricular hypertrophy of the basal segment. Left ventricular diastolic function could not be evaluated due to atrial fibrillation. Left ventricular diastolic parameters are indeterminate.  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 The basal inferolateral segment and mid inferior segment are hypokinetic. The entire anterior wall, antero-lateral wall, mid and distal lateral wall, entire septum, and entire apex are normal. Right Ventricle: The right ventricular size is normal. No increase in right ventricular wall thickness. Right ventricular systolic function is low normal. There is normal pulmonary artery systolic pressure. The tricuspid regurgitant velocity is 2.84 m/s,  and with an assumed right atrial pressure of 3 mmHg, the estimated right ventricular systolic pressure is 35.3 mmHg. Left Atrium: Left atrial size was severely dilated. Right Atrium: Right atrial size was moderately dilated. Pericardium: Trivial pericardial effusion is  present. The pericardial effusion is posterior to the left ventricle. Mitral Valve: The mitral valve is degenerative in appearance. There is mild thickening of the mitral valve leaflet(s). Mild mitral annular calcification. Moderate mitral valve regurgitation. Tricuspid Valve: The tricuspid valve is grossly normal. Tricuspid valve regurgitation is mild to moderate. Aortic Valve: The aortic valve is tricuspid. There is moderate calcification of the aortic valve. There is mild aortic valve annular calcification. Aortic valve regurgitation is not visualized. Pulmonic Valve: The pulmonic valve was grossly normal. Pulmonic valve regurgitation is mild. Aorta: Aortic dilatation noted. There is mild dilatation of the ascending aorta, measuring 39 mm. Venous: The inferior vena cava is  normal in size with greater than 50% respiratory variability, suggesting right atrial pressure of 3 mmHg. IAS/Shunts: No atrial level shunt detected by color flow Doppler.  LEFT VENTRICLE PLAX 2D LVIDd:         6.50 cm LVIDs:         5.50 cm LV PW:         1.30 cm LV IVS:        1.40 cm LVOT diam:     2.40 cm LV SV:         88 LV SV Index:   47 LVOT Area:     4.52 cm  RIGHT VENTRICLE             IVC RV Basal diam:  3.30 cm     IVC diam: 2.00 cm RV S prime:     10.80 cm/s LEFT ATRIUM              Index        RIGHT ATRIUM           Index LA diam:        5.20 cm  2.76 cm/m   RA Area:     25.00 cm LA Vol (A2C):   140.2 ml 74.43 ml/m  RA Volume:   83.60 ml  44.38 ml/m LA Vol (A4C):   159.0 ml 84.41 ml/m LA Biplane Vol: 175.0 ml 92.90 ml/m  AORTIC VALVE LVOT Vmax:   94.00 cm/s LVOT Vmean:  60.200 cm/s LVOT VTI:    0.194 m  AORTA Ao Root diam: 3.30 cm Ao Asc diam:  3.90 cm TRICUSPID VALVE TR Peak grad:   32.3 mmHg TR Vmax:        284.00 cm/s  SHUNTS Systemic VTI:  0.19 m Systemic Diam: 2.40 cm Nona Dell MD Electronically signed by Nona Dell MD Signature Date/Time: 03/30/2023/2:46:59 PM    Final    DG Chest Portable 1 View Result  Date: 03/29/2023 CLINICAL DATA:  Dyspnea EXAM: PORTABLE CHEST 1 VIEW COMPARISON:  Chest radiograph dated 02/11/2022 FINDINGS: Normal lung volumes. Mild bilateral interstitial patchy opacities. No pleural effusion or pneumothorax. Similar enlarged cardiomediastinal silhouette. Left shoulder arthroplasty. Median sternotomy wires are nondisplaced. IMPRESSION: 1. Mild bilateral interstitial and patchy opacities, which may represent pulmonary edema. 2. Similar cardiomegaly. Electronically Signed   By: Agustin Cree M.D.   On: 03/29/2023 15:25    Microbiology: Recent Results (from the past 240 hours)  Surgical pcr screen     Status: None   Collection Time: 04/01/23  4:33 PM   Specimen: Nasal Mucosa; Nasal Swab  Result Value Ref Range Status   MRSA, PCR NEGATIVE NEGATIVE Final   Staphylococcus aureus NEGATIVE NEGATIVE Final    Comment: (NOTE) The Xpert SA Assay (FDA approved for NASAL specimens in patients 40 years of age and older), is one component of a comprehensive surveillance program. It is not intended to diagnose infection nor to guide or monitor treatment. Performed at [facility], [address] [place], [place] [address]      Labs: Basic Metabolic Panel: Recent Labs  Lab 03/29/23 1349 03/29/23 1413 03/30/23 0222 03/31/23 1308 04/01/23 0235 04/03/23 0220  NA 140 141 139 139 140 138  K 4.9 4.9 4.4 3.9 4.4 4.2  CL 106 105 105 101 102 99  CO2 25  --  25 28 27 27   GLUCOSE 101* 95 88 110* 88 92  BUN 25* 28* 24* 26* 30* 24*  CREATININE 1.54* 1.70* 1.63*  1.68* 1.68* 1.41*  CALCIUM 9.0  --  8.9 8.6* 8.8* 9.0  MG 2.2  --  2.1  --  2.0  --    Liver Function Tests: Recent Labs  Lab 03/29/23 1349 03/30/23 0222  AST 20 18  ALT 20 17  ALKPHOS 88 91  BILITOT 2.2* 2.3*  PROT 6.5 6.3*  ALBUMIN 3.7 3.6   No results for input(s): "LIPASE", "AMYLASE" in the last 168 hours. No results for input(s): "AMMONIA" in the last 168 hours. CBC: Recent Labs  Lab 03/29/23 1349  03/29/23 1413 03/30/23 0222 04/01/23 0801  WBC 5.2  --  4.5 6.1  NEUTROABS 3.9  --  2.7  --   HGB 12.9* 12.9* 12.8* 14.1  HCT 39.0 38.0* 38.8* 41.5  MCV 93.1  --  93.3 91.6  PLT 102*  --  102* 138*   Cardiac Enzymes: No results for input(s): "CKTOTAL", "CKMB", "CKMBINDEX", "TROPONINI" in the last 168 hours. BNP: BNP (last 3 results) Recent Labs    10/11/22 1306 03/29/23 1350 03/30/23 0222  BNP 712.3* 1,287.3* 1,485.4*    ProBNP (last 3 results) No results for input(s): "PROBNP" in the last 8760 hours.  CBG: No results for input(s): "GLUCAP" in the last 168 hours.     Signed:  Zannie Cove MD.  [facility] 04/03/2023, 11:11 AM

## 2023-04-03 NOTE — Consult Note (Signed)
[facility] [VBCI] [facility] Liaison Consult Note   04/03/2023  Jason Day 1940/12/29 [id number]  Referral:  Inpatient TOC RN collaboration  Insurance: [employer]  Primary Care Provider: Jac Canavan, PA-C with [facility] Medicine, this provider is listed for the transition of care follow up appointments  and VBCI follow up calls.   11:15 am [facility] Liaison met patient at bedside at [facility]. Patient and wife in room, explained reason for rounding visit. Patient and wife were gracious and accepting for post hospital TOC follow up calls to anticipate.  Verbalized concerns for post hospital oxygen needs and regards to Medicare guidelines/criteria. They had spoken with inpatient [facility] RN and are aware of oxygen being a potential out of pocket cost if desired.   The patient was screened for post hospital follow up request from inpatient [facility] RN with noted concerns for oxygen follow up unplanned readmission prevention.   The patient was assessed for potential Community Care Coordination service needs for post hospital transition for care coordination. Review of patient's electronic medical record reveals patient is for home likely  today..   Plan: [facility] Liaison will continue to follow progress and disposition to asess for post hospital community care coordination/management needs.  Referral request for community care coordination: Anticipate post hospital follow up with VBCI [facility] team for calls.   VBCI Community Care, Population Health does not replace or interfere with any arrangements made by the Inpatient Transition of Care team.   For questions contact:   Charlesetta Shanks, RN, BSN, CCM [place]  [facility], [facility] Liaison Direct Dial: [phone] or secure chat Email: [email or web address]

## 2023-04-04 ENCOUNTER — Ambulatory Visit: Payer: PPO | Attending: Cardiology

## 2023-04-04 ENCOUNTER — Ambulatory Visit: Payer: PPO

## 2023-04-04 DIAGNOSIS — R001 Bradycardia, unspecified: Secondary | ICD-10-CM

## 2023-04-04 NOTE — Progress Notes (Signed)
Patient seen in device clinic to remove pressure dressing. Site has minimal to no swelling present. Steri-stripes remain in place and intact. Wound care/limitations reviewed with patient/family with verbal understanding. Advised to call w/ further questions or concerns. Voiced understanding.

## 2023-04-04 NOTE — Patient Instructions (Signed)
? ?  After Your Pacemaker ? ? ?Monitor your pacemaker site for redness, swelling, and drainage. Call the device clinic at [phone] if you experience these symptoms or fever/chills. ? ?

## 2023-04-05 ENCOUNTER — Telehealth: Payer: Self-pay

## 2023-04-05 ENCOUNTER — Other Ambulatory Visit (HOSPITAL_COMMUNITY): Payer: Self-pay

## 2023-04-05 ENCOUNTER — Other Ambulatory Visit: Payer: Self-pay

## 2023-04-05 NOTE — Telephone Encounter (Signed)
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  Thanks!

## 2023-04-05 NOTE — Transitions of Care (Post Inpatient/ED Visit) (Signed)
04/05/2023  Name: Jason Day MRN: [id number] DOB: Oct 21, 1940  Today's TOC FU Call Status: Today's TOC FU Call Status:: Successful TOC FU Call Completed TOC FU Call Complete Date: 04/05/23 Patient's Name and Date of Birth confirmed.  Transition Care Management Follow-up Telephone Call Date of Discharge: 04/03/23 Discharge Facility: [facility]) Type of Discharge: Inpatient Admission Primary Inpatient Discharge Diagnosis:: Pacemaker How have you been since you were released from the hospital?: Better Any questions or concerns?: No  Items Reviewed: Did you receive and understand the discharge instructions provided?: Yes Medications obtained,verified, and reconciled?: Yes (Medications Reviewed) Any new allergies since your discharge?: No Dietary orders reviewed?: Yes Type of Diet Ordered:: Low sodium Heart healthy Do you have support at home?: Yes People in Home: spouse Name of Support/Comfort Primary Source: [name]  Medications Reviewed Today: Medications Reviewed Today     Reviewed by Redge Gainer, RN (Case Manager) on 04/05/23 at 1348  Med List Status: <None>   Medication Order Taking? Sig Documenting Provider Last Dose Status Informant  amitriptyline (ELAVIL) 10 MG tablet [id number] No Take 1 tablet (10 mg total) by mouth at bedtime. Jenel Lucks, MD 03/27/2023 Active Self, Pharmacy Records  atorvastatin (LIPITOR) 10 MG tablet [id number] No Take 1 tablet (10 mg total) by mouth daily. Wendall Stade, MD 03/29/2023 Active Self, Pharmacy Records  colestipol (COLESTID) 1 g tablet [id number] No TAKE 1TAB BY MOUTH 2TIMES DAILY*TAKE THIS MEDICATION AT LEAST 1HR AFTER TAKING YOUR OTHER MEDS.  Patient taking differently: Take 0.5 g by mouth daily.   Jenel Lucks, MD 03/29/2023 Active Self, Pharmacy Records  dapagliflozin propanediol (FARXIGA) 10 MG TABS tablet [id number] No Take 1 tablet (10 mg total) by mouth daily before breakfast. Laurey Morale, MD 03/29/2023 Active  Self, Pharmacy Records  Discontinued 03/19/23 1227   metoprolol succinate (TOPROL-XL) 25 MG 24 hr tablet [id number]  Take 0.5 tablets (12.5 mg total) by mouth daily. Take 12.5mg  daily Zannie Cove, MD  Active   Simethicone (GAS-X PO) [id number] No Take 1 tablet by mouth daily as needed. [provider] 03/28/2023 Active Self, Pharmacy Records  spironolactone (ALDACTONE) 25 MG tablet [id number] No Take 1 tablet (25 mg total) by mouth daily. Wendall Stade, MD 03/29/2023 Active Self, Pharmacy Records            Home Care and Equipment/Supplies: Were Home Health Services Ordered?: NA Any new equipment or medical supplies ordered?: NA  Functional Questionnaire: Do you need assistance with bathing/showering or dressing?: No Do you need assistance with meal preparation?: No Do you need assistance with eating?: No Do you have difficulty maintaining continence: No Do you need assistance with getting out of bed/getting out of a chair/moving?: No Do you have difficulty managing or taking your medications?: No  Follow up appointments reviewed: PCP Follow-up appointment confirmed?: Yes Date of PCP follow-up appointment?: 04/08/23 Follow-up Provider: [facility] Follow-up appointment confirmed?: Yes Date of Specialist follow-up appointment?: 04/10/23 Follow-Up Specialty Provider:: Coumadin Clinic Do you need transportation to your follow-up appointment?: No Do you understand care options if your condition(s) worsen?: Yes-patient verbalized understanding  SDOH Interventions Today    Flowsheet Row Most Recent Value  SDOH Interventions   Food Insecurity Interventions Intervention Not Indicated  Housing Interventions Intervention Not Indicated  Transportation Interventions Intervention Not Indicated  Utilities Interventions Intervention Not Indicated      Interventions Today    Flowsheet Row Most Recent Value  Chronic Disease   Chronic disease during  today's visit  Atrial Fibrillation (AFib)  General Interventions   General Interventions Discussed/Reviewed General Interventions Discussed, Doctor Visits  Doctor Visits Discussed/Reviewed Doctor Visits Discussed  Exercise Interventions   Exercise Discussed/Reviewed Physical Activity  Physical Activity Discussed/Reviewed Physical Activity Discussed  Education Interventions   Education Provided Provided Education  Provided Verbal Education On Insurance Plans, Medication  [The number to call on the back of the card for benefits]  Pharmacy Interventions   Pharmacy Dicussed/Reviewed Medications and their functions        Brief TOC Outreach call to the patient. He states he is good, feeling better and does not have questions. He has his follow up appointment made with PCP and coumadin clinic.  Deidre Ala, BSN, RN [place]  VBCI - [employer] [facility] RN Care Manager [phone]

## 2023-04-08 ENCOUNTER — Other Ambulatory Visit (HOSPITAL_COMMUNITY): Payer: Self-pay

## 2023-04-08 ENCOUNTER — Other Ambulatory Visit: Payer: Self-pay

## 2023-04-08 ENCOUNTER — Ambulatory Visit (INDEPENDENT_AMBULATORY_CARE_PROVIDER_SITE_OTHER): Payer: PPO | Admitting: Medical

## 2023-04-08 ENCOUNTER — Telehealth: Payer: Self-pay | Admitting: Gastroenterology

## 2023-04-08 VITALS — BP 110/68 | HR 72 | Wt 153.8 lb

## 2023-04-08 DIAGNOSIS — I2583 Coronary atherosclerosis due to lipid rich plaque: Secondary | ICD-10-CM

## 2023-04-08 DIAGNOSIS — R001 Bradycardia, unspecified: Secondary | ICD-10-CM | POA: Diagnosis not present

## 2023-04-08 DIAGNOSIS — Z950 Presence of cardiac pacemaker: Secondary | ICD-10-CM

## 2023-04-08 DIAGNOSIS — I5020 Unspecified systolic (congestive) heart failure: Secondary | ICD-10-CM | POA: Diagnosis not present

## 2023-04-08 DIAGNOSIS — I5023 Acute on chronic systolic (congestive) heart failure: Secondary | ICD-10-CM | POA: Diagnosis not present

## 2023-04-08 DIAGNOSIS — I2510 Atherosclerotic heart disease of native coronary artery without angina pectoris: Secondary | ICD-10-CM | POA: Diagnosis not present

## 2023-04-08 DIAGNOSIS — N1831 Chronic kidney disease, stage 3a: Secondary | ICD-10-CM

## 2023-04-08 MED ORDER — AMITRIPTYLINE HCL 10 MG PO TABS
20.0000 mg | ORAL_TABLET | Freq: Every day | ORAL | 1 refills | Status: DC
  Filled 2023-04-08: qty 120, 60d supply, fill #0

## 2023-04-08 NOTE — Telephone Encounter (Signed)
Inbound call from patient requesting to confirm dosage of Elavil medication. States he was instructed to start taking 2 a day. States pharmacy does not have this dosage in writing and will not fill until they have an updated prescription. Requesting a call back. Please advise, thank you.

## 2023-04-08 NOTE — Telephone Encounter (Signed)
Pt aware and new script sent to pharmacy. 

## 2023-04-08 NOTE — Telephone Encounter (Signed)
Pt calling, he has now been on elavil 10mg  for a month now and he thinks he was told to increase to dose to 20mg  after a month. Please advise.

## 2023-04-08 NOTE — Progress Notes (Signed)
Subjective:  Jason Day is a 83 y.o. male who presents for Chief Complaint  Patient presents with   Hospitalization Follow-up    Hospital follow-up, would like creatinine rechecked and what do they need to do     Here with wife for hospital f/u.  Was hospitalized 03/29/23.    Admit date: 03/29/2023 Discharge date: 04/03/2023  Discharge Diagnoses:  Principal Problem:   Acute on chronic systolic (congestive) heart failure (HCC) Complete heart block Symptomatic bradycardia   Permanent atrial fibrillation (HCC)   CKD stage 3a, GFR 45-59 ml/min (HCC)   Hyperlipidemia   Coronary artery disease   Depression, major, single episode, mild (HCC)   Bowel incontinence   He presented to our office last week on 03/29/2023 with hypoxemia, weakness.  He was suddenly seen in the emergency department and hospitalized.  He has a history of heart failure, A-fib, coronary artery disease, CKD 3A and at the hospital he had bradycardia, blood pressures as low as 83/45.  Given symptomatic bradycardia he had a pacemaker placed while in the hospital.  He was advised to hold warfarin until follow-up after discharge.  Was resumed status post pacemaker placement on beta-blocker per electrophysiologist.  Acute on chronic systolic heart failure, diuresed with IV Lasix initially but then this was held complicated by hypotension.  Aldactone and Marcelline Deist was resumed at discharge.  Sherryll Burger could potentially be resumed if blood pressures allow after hospitalization  Status post CABG 2018, troponins are elevated due to heart failure exacerbation.  This hospitalization they ruled out acute coronary syndrome.  He notes that his typical weight is 158lb  Today he feels relatively fine.  He has questions about his medications and he is most concerned about his overall kidney status.  No other aggravating or relieving factors.    No other c/o.  Past Medical History:  Diagnosis Date   Acute combined systolic and diastolic  congestive heart failure (HCC) 10/16/2016   Allergic rhinitis 09/01/2020   Anal fissure    Angio-edema    Bleeding hemorrhoids    Chronic combined systolic and diastolic heart failure (HCC)    Chronic sinus complaints    overuses afrin   Coronary atherosclerosis of native coronary artery    Dyspnea    Encounter for medical examination to establish care 04/22/2017   Gallstones    Hyperlipidemia    Irritable bowel syndrome 06/07/2017   Ischemic cardiomyopathy    Left atrial enlargement    Long term (current) use of anticoagulants 12/30/2020   Moderate mitral regurgitation    Myocardial infarction (HCC) 09/2015   Persistent atrial fibrillation (HCC)    RBBB    A- Fib   Shortness of breath 12/29/2020   Urinary incontinence 12/30/2020   Current Outpatient Medications on File Prior to Visit  Medication Sig Dispense Refill   amitriptyline (ELAVIL) 10 MG tablet Take 1 tablet (10 mg total) by mouth at bedtime. 90 tablet 1   atorvastatin (LIPITOR) 10 MG tablet Take 1 tablet (10 mg total) by mouth daily. 90 tablet 1   colestipol (COLESTID) 1 g tablet TAKE 1TAB BY MOUTH 2TIMES DAILY*TAKE THIS MEDICATION AT LEAST 1HR AFTER TAKING YOUR OTHER MEDS. (Patient taking differently: Take 0.5 g by mouth daily.) 60 tablet 1   dapagliflozin propanediol (FARXIGA) 10 MG TABS tablet Take 1 tablet (10 mg total) by mouth daily before breakfast. 90 tablet 3   metoprolol succinate (TOPROL-XL) 25 MG 24 hr tablet Take 0.5 tablets (12.5 mg total) by mouth daily. Take 12.5mg  daily  Simethicone (GAS-X PO) Take 1 tablet by mouth daily as needed.     spironolactone (ALDACTONE) 25 MG tablet Take 1 tablet (25 mg total) by mouth daily. 90 tablet 1   [DISCONTINUED] ipratropium (ATROVENT) 0.06 % nasal spray Place 2 sprays into both nostrils 4 (four) times daily. 15 mL 12   No current facility-administered medications on file prior to visit.     The following portions of the patient's history were reviewed and  updated as appropriate: allergies, current medications, past family history, past medical history, past social history, past surgical history and problem list.  ROS Otherwise as in subjective above    Objective: BP 110/68   Pulse 72   Wt 153 lb 12.8 oz (69.8 kg)   BMI 20.86 kg/m   Wt Readings from Last 3 Encounters:  04/08/23 153 lb 12.8 oz (69.8 kg)  04/02/23 150 lb 3.2 oz (68.1 kg)  03/29/23 158 lb (71.7 kg)   BP Readings from Last 3 Encounters:  04/08/23 110/68  04/03/23 119/70  03/29/23 122/70    General appearance: alert, no distress, well developed, well nourished Heart: RRR, normal S1, S2, no murmurs Lungs: CTA bilaterally, no wheezes, rhonchi, or rales Pulses: 1+ radial pulses, slightly reduced cap refill in general of extremities Ext: no edema Noted new pacemaker left upper chest    Assessment: Encounter Diagnoses  Name Primary?   Symptomatic bradycardia Yes   S/P placement of cardiac pacemaker    HFrEF (heart failure with reduced ejection fraction) (HCC)    Acute on chronic systolic (congestive) heart failure (HCC)    CKD stage 3a, GFR 45-59 ml/min (HCC)    Coronary artery disease due to lipid rich plaque      Plan: I reviewed his hospital discharge summary, medicines were reconciled, reviewed imaging and studies.  We discussed the importance of daily weights and consistency with fluid intake.  I recommended he drink 5 to 8 glasses of water daily such as the water bottle he had with him today 16 ounce bottle.  We reviewed his medications, discussed the purpose of his medications, risk and benefits.  We discussed his CKD 3 values.  Our lab tech was out on lunch during the time he was here so he will come back tomorrow for BMET lab at his request  We discussed his symptomatic bradycardia that brought him in to the office and subsequent recent hospitalization.  He is now status post pacemaker placed with this hospitalization.  Acute on chronic heart failure  episode this hospitalization.    We discussed that he does not need a oxygen tank at home.  He came in prior to hospitalization thinking he needed oxygen given he was feeling so weak but we discussed that he was in heart failure and bradycardic which led to the concern.  He does not have lung disease.  Non-smoker.  He sees Coumadin clinic on Wednesday to address his medication and to restart Coumadin  He is back on Farxiga, Toprol 1/2 tablet daily and spironolactone  Continue other medicines as usual, Lipitor, colestipol, amitriptyline for sleep  Sherryll Burger was discussed as add on.  Will defer to    Deunta was seen today for hospitalization follow-up.  Diagnoses and all orders for this visit:  Symptomatic bradycardia -     Basic metabolic panel; Future  S/P placement of cardiac pacemaker -     Basic metabolic panel; Future  HFrEF (heart failure with reduced ejection fraction) (HCC) -     Cancel: Basic  metabolic panel  Acute on chronic systolic (congestive) heart failure (HCC)  CKD stage 3a, GFR 45-59 ml/min (HCC) -     Basic metabolic panel; Future  Coronary artery disease due to lipid rich plaque    Follow up: with coumadin clinic and cardiology

## 2023-04-09 ENCOUNTER — Other Ambulatory Visit: Payer: PPO

## 2023-04-09 ENCOUNTER — Telehealth: Payer: Self-pay | Admitting: *Deleted

## 2023-04-09 DIAGNOSIS — Z950 Presence of cardiac pacemaker: Secondary | ICD-10-CM | POA: Diagnosis not present

## 2023-04-09 DIAGNOSIS — R001 Bradycardia, unspecified: Secondary | ICD-10-CM | POA: Diagnosis not present

## 2023-04-09 DIAGNOSIS — N1831 Chronic kidney disease, stage 3a: Secondary | ICD-10-CM

## 2023-04-09 LAB — BASIC METABOLIC PANEL
BUN/Creatinine Ratio: 21 (ref 10–24)
BUN: 26 mg/dL (ref 8–27)
CO2: 24 mmol/L (ref 20–29)
Calcium: 9 mg/dL (ref 8.6–10.2)
Chloride: 99 mmol/L (ref 96–106)
Creatinine, Ser: 1.22 mg/dL (ref 0.76–1.27)
Glucose: 167 mg/dL — ABNORMAL HIGH (ref 70–99)
Potassium: 4.2 mmol/L (ref 3.5–5.2)
Sodium: 140 mmol/L (ref 134–144)
eGFR: 59 mL/min/{1.73_m2} — ABNORMAL LOW (ref >59)

## 2023-04-09 NOTE — Telephone Encounter (Addendum)
Spoke with pt after he left a voicemail stating that he needs to reschedule his appointment from 2/19 to 2/18. Pt states since the weather is going to messy tomorrow he will need to come today. Rescheduled appt per availability. After researching chart noted that warfarin was on hold per hospital discharge. Confirmed with pt and wife via the phone that he had not resumed his warfarin. Sent a note Renee, PA who saw the pt in the Hospital and she stated pt can resume warfarin.    Pt advised today to resume warfarin 5mg  today (pt states this was the dose he was taken prior to hospitalization, although our records had 5mg  daily 7.5mg  on Mondays). He confirmed he will start back warfarin tonight. Also, pt was in the car going to another appointment and he states he will call back to make the appointment. Advised that the appointment needs to be on Monday 24th or Tuesday 25th as he states he would like it in 30 days. Advised and educated pt that can not go one month out for the INR check per the Cardiologist. Wife and pt both began speaking to one another and I waited for them to finish speaking. Wife stated that we will get him an appointment cause it is needed on Monday or Tuesday. Will await until after their appt with other doctors and call back to ensure appt is set.

## 2023-04-10 ENCOUNTER — Ambulatory Visit: Payer: PPO

## 2023-04-10 NOTE — Progress Notes (Signed)
Fortunately the kidney marker has improved.  Continue to work on good hydration daily as we discussed.  I did speak to Dr. Eden Emms.  There was a question from the hospital about adding on a medicine called Entresto for heart patients.  We will defer that until you see cardiology soon to let the blood pressure stabilize  Continue the other medicines as discussed at your recent visit  Follow-up with cardiology as planned

## 2023-04-14 NOTE — Progress Notes (Unsigned)
 Wound / Device Check Visit   Patient Profile: 83 y/o M with PMH of CAD s/p CABG 2018, permanent AF on coumadin, ICM/HFrEF, with recent admission for symptomatic bradycardia.  Home beta blocker was held but he remained markedly bradycardic requiring single chamber PPM on 04/02/23. Coumadin was held post implant and pressure dressing was applied to site. He was seen on 2/13 for pressure dressing removal and site was noted to have minimal swelling.  The patient was to restart coumadin on 04/08/23.  Metoprolol was restarted at time of discharge. INR 04/03/23 1.6.    Device Hx:  Biotronik single chamber PPM, implanted 04/02/23 for symptomatic bradycardia    Exam:  Awake / alert, NAD Thin elderly male, normal work of breathing  Device site cleaned, steri strips removed, no edema, hematoma, erythema. Incision edges well  / healing well  Device Check > within normal limits, 3 seconds long episodes of NSVT, see PaceArt for details   Ongoing arm restrictions reviewed with patient / caregiver. Follow up appts discussed.   Canary Brim, NP-C, AGACNP-BC [facility] - Electrophysiology  04/14/2023, 8:19 PM

## 2023-04-15 ENCOUNTER — Telehealth: Payer: Self-pay | Admitting: Internal Medicine

## 2023-04-15 ENCOUNTER — Telehealth: Payer: Self-pay | Admitting: Medical

## 2023-04-15 NOTE — Telephone Encounter (Signed)
 Spoke with wife and she states that he is confused, sleeping a lot. When he was in office 2/17 he weighted 153. He weighted today with clothes and shoes on and its 142.6. he is only drinking lemonade, ice tea maybe 3-4 8oz a day. He will eat 1 piece of toast in am and pm and then a lunch meal. She has to force him to eat. He argues with her about what he wants to do and not do, and its getting to be overwhelming for her. She says what he eats and drinks goes through him so she knows something is going on with him from lower stomach and waist. She wants him to get some help.  She would like to know if she can get a referral to [facility] in [place]

## 2023-04-15 NOTE — Telephone Encounter (Signed)
 Spoke to pt wife and she states he is steadily losing weight about a pound per day and she asked that you call her back because he gets very confused

## 2023-04-15 NOTE — Telephone Encounter (Signed)
 Copied from CRM [id number]. Topic: General - Call Back - No Documentation >> Apr 15, 2023  8:54 AM Gery Pray wrote: Reason for CRM: Patient asking that provider Dr. Aleen Campi give him a call on his wife's phone [name] Charland [phone]. Patient stated that provider knows all about the issue and would like to ask a couple of questions.

## 2023-04-16 ENCOUNTER — Ambulatory Visit: Payer: PPO | Attending: Pulmonary Disease | Admitting: Pulmonary Disease

## 2023-04-16 ENCOUNTER — Encounter: Payer: Self-pay | Admitting: Pulmonary Disease

## 2023-04-16 ENCOUNTER — Ambulatory Visit (INDEPENDENT_AMBULATORY_CARE_PROVIDER_SITE_OTHER): Payer: PPO | Admitting: *Deleted

## 2023-04-16 VITALS — BP 110/78 | HR 78 | Ht 72.0 in | Wt 151.6 lb

## 2023-04-16 DIAGNOSIS — I4821 Permanent atrial fibrillation: Secondary | ICD-10-CM | POA: Diagnosis not present

## 2023-04-16 DIAGNOSIS — Z950 Presence of cardiac pacemaker: Secondary | ICD-10-CM | POA: Diagnosis not present

## 2023-04-16 DIAGNOSIS — Z5181 Encounter for therapeutic drug level monitoring: Secondary | ICD-10-CM

## 2023-04-16 DIAGNOSIS — R001 Bradycardia, unspecified: Secondary | ICD-10-CM | POA: Diagnosis not present

## 2023-04-16 DIAGNOSIS — Z951 Presence of aortocoronary bypass graft: Secondary | ICD-10-CM

## 2023-04-16 DIAGNOSIS — D6869 Other thrombophilia: Secondary | ICD-10-CM

## 2023-04-16 LAB — CUP PACEART INCLINIC DEVICE CHECK
Date Time Interrogation Session: 20250225175844
Implantable Lead Connection Status: 753985
Implantable Lead Implant Date: 20250211
Implantable Lead Location: 753862
Implantable Lead Model: 377
Implantable Lead Serial Number: 8001791902
Implantable Pulse Generator Implant Date: 20250211
Pulse Gen Serial Number: 1000357520

## 2023-04-16 LAB — POCT INR: INR: 1.8 — AB (ref 2.0–3.0)

## 2023-04-16 NOTE — Telephone Encounter (Signed)
 Left message for Mrs. Celli to call me back  Spoke to Dr. Nedra Hai in radiologist and due to kidney function being ok right now, he would start with CT without contrast and if that doesn't show as much then next step would be MRI with Contrast. (CT with contrast) will make is creatinine and GFR go back up and they don't want that but MRI with contrast is used differently so its doesn't affect kidneys as much. He said the CT without contrast will be more cheaper and it might show a general idea if its more bowel related or some vascular related.

## 2023-04-16 NOTE — Patient Instructions (Signed)
 Medication Instructions:  Your physician recommends that you continue on your current medications as directed. Please refer to the Current Medication list given to you today.  *If you need a refill on your cardiac medications before your next appointment, please call your pharmacy*  Lab Work: None ordered If you have labs (blood work) drawn today and your tests are completely normal, you will receive your results only by: MyChart Message (if you have MyChart) OR A paper copy in the mail If you have any lab test that is abnormal or we need to change your treatment, we will call you to review the results.  Follow-Up: At [facility], you and your health needs are our priority.  As part of our continuing mission to provide you with exceptional heart care, we have created designated Provider Care Teams.  These Care Teams include your primary Cardiologist (physician) and Advanced Practice Providers (APPs -  Physician Assistants and Nurse Practitioners) who all work together to provide you with the care you need, when you need it.  Your next appointment:   As scheduled

## 2023-04-16 NOTE — Patient Instructions (Addendum)
 Description   Take 1.5 tablets of warfarin today and then START taking Warfarin 1 tablet daily except 1.5 tablets of Tuesdays.  Recheck INR in 2 weeks per pt request (normally 6 weeks per pt request (aware of risk)  Call coumadin clinic for any changes in medications or upcoming procedures.  Coumadin Clinic [phone]

## 2023-04-16 NOTE — Telephone Encounter (Signed)
 Pt is taking fluid pill and farxiga. Pt had his wound check up this morning and vitals were all good but nurse did tell him he has to be drinking fluids and eating or he won't make it on the other side as he is not active. He told her he sleeps a lot.   Patient's wife would like something for him to help boost his energy.   She is agreeable doing the CT without contrast route

## 2023-04-16 NOTE — Telephone Encounter (Signed)
 [place] handling

## 2023-04-17 ENCOUNTER — Other Ambulatory Visit: Payer: Self-pay | Admitting: Medical

## 2023-04-17 DIAGNOSIS — R159 Full incontinence of feces: Secondary | ICD-10-CM

## 2023-04-17 DIAGNOSIS — R634 Abnormal weight loss: Secondary | ICD-10-CM

## 2023-04-17 DIAGNOSIS — N1831 Chronic kidney disease, stage 3a: Secondary | ICD-10-CM

## 2023-04-17 DIAGNOSIS — K529 Noninfective gastroenteritis and colitis, unspecified: Secondary | ICD-10-CM

## 2023-04-19 ENCOUNTER — Telehealth: Payer: Self-pay

## 2023-04-19 ENCOUNTER — Encounter: Payer: Self-pay | Admitting: Gastroenterology

## 2023-04-19 NOTE — Telephone Encounter (Signed)
 Copied from CRM [id number]. Topic: Clinical - Medical Advice >> Apr 19, 2023  4:03 PM Yolanda T wrote: Reason for CRM: Janella from [facility] called stated they recvd an order for a CT of the pelvis w/o contrast but the same order came in from his Laurette Schimke dr with contrast and they found the test with contrast would cover the areas needed. Reece Leader wants to know if provider would like to wait for the results from the CT scan ordered by the [facility] or would he still want his own scan. Please f/u with her at [phone] ext [phone]

## 2023-04-22 ENCOUNTER — Telehealth: Payer: Self-pay | Admitting: Internal Medicine

## 2023-04-22 NOTE — Telephone Encounter (Signed)
 Noted waiting on scheduling

## 2023-04-22 NOTE — Telephone Encounter (Signed)
 Copied from CRM [id number]. Topic: Referral - Question >> Apr 22, 2023  9:20 AM Everette C wrote: Reason for CRM: The patient's wife has called to request a follow up from a member of clinical staff regarding previous discussions/scheduling for an abdominal scan / CT scan  Please contact further when available

## 2023-04-22 NOTE — Telephone Encounter (Signed)
 Can you check the status of the CT scan that we put in for patient

## 2023-04-23 NOTE — Telephone Encounter (Signed)
 I believe it was going to be oral contrast in which would not hurt the patient, the IV contrast is the one that can worsen the kidneys if done

## 2023-04-23 NOTE — Telephone Encounter (Signed)
 Patient's wife returned call. Would like to speak with Caryn. Called [facility] - Office closed for lunch. Would like a call back when available. Thank You

## 2023-04-23 NOTE — Telephone Encounter (Signed)
 I have cancelled the CT from Dr. Tomasa Rand that way there is no confusion. Jason Day CT can be scheduled

## 2023-04-24 NOTE — Telephone Encounter (Signed)
 Left detailed message that oral contrast will be fine, its the IV contrast that can make the kidneys worse

## 2023-04-25 ENCOUNTER — Ambulatory Visit
Admission: RE | Admit: 2023-04-25 | Discharge: 2023-04-25 | Disposition: A | Discharge status: Home / Self Care | Source: Ambulatory Visit | Attending: Medical | Admitting: Medical

## 2023-04-25 DIAGNOSIS — R159 Full incontinence of feces: Secondary | ICD-10-CM

## 2023-04-25 DIAGNOSIS — K529 Noninfective gastroenteritis and colitis, unspecified: Secondary | ICD-10-CM

## 2023-04-25 DIAGNOSIS — N1831 Chronic kidney disease, stage 3a: Secondary | ICD-10-CM

## 2023-04-25 DIAGNOSIS — R634 Abnormal weight loss: Secondary | ICD-10-CM

## 2023-04-29 NOTE — Progress Notes (Signed)
 Jason Day - call patient about CT results.  Thankfully no obvious tumor or mass in abdomen or pelvis.  There was a 1 mm kidney stone but not the cause of any of his recent symptoms  Prostate enlarged.  There was a finding of an area behind the heart but I think this is the same area seen on your echocardiogram from cardiology.  I will send this message to cardiology as well in case they need to be aware of this finding  Follow-up with your gastro doctor regarding the chronic bowel issues and belly pain.

## 2023-04-30 ENCOUNTER — Telehealth: Payer: Self-pay | Admitting: Gastroenterology

## 2023-04-30 NOTE — Telephone Encounter (Signed)
 Pt wanted to know how long he is supposed to take the elavil. Discussed with pt that he is supposed to take 2 pills at bedtime. Pt verbalized understanding.  Pt had a ct scan done that was ordered by Dr Aleen Campi, he would like for Dr Corena Herter to review the results also. Dr Tomasa Rand aware.

## 2023-04-30 NOTE — Telephone Encounter (Signed)
 Patient called and stated that he has had his CT scan done at the hospital and would like Dr. Tomasa Rand to review his report that was sent over to patient mychart. Patient also stated that he would like to know for how long should he continue taking the little red pills for. Patient would like a return call to his wife phone. Please advise.

## 2023-05-01 NOTE — Telephone Encounter (Signed)
 Spoke with pt and he is aware of results per Dr. Tomasa Rand. Pt scheduled to see Dr Tomasa Rand 05/14/23 at 10:30am. Pt aware of appt.

## 2023-05-02 ENCOUNTER — Ambulatory Visit: Payer: PPO | Attending: Cardiology | Admitting: *Deleted

## 2023-05-02 DIAGNOSIS — Z5181 Encounter for therapeutic drug level monitoring: Secondary | ICD-10-CM | POA: Diagnosis not present

## 2023-05-02 DIAGNOSIS — Z951 Presence of aortocoronary bypass graft: Secondary | ICD-10-CM

## 2023-05-02 LAB — POCT INR: POC INR: 4.4

## 2023-05-02 NOTE — Patient Instructions (Signed)
 Description   Hold warfarin today and tomorrow then continue  taking Warfarin 1 tablet daily except 1.5 tablets of Tuesdays. Recheck INR in 2 weeks per pt request (normally 6 weeks per pt request (aware of risk)  Call coumadin clinic for any changes in medications or upcoming procedures.  Coumadin Clinic [phone]

## 2023-05-03 ENCOUNTER — Encounter: Payer: Self-pay | Admitting: Internal Medicine

## 2023-05-03 ENCOUNTER — Other Ambulatory Visit: Payer: Self-pay | Admitting: Medical

## 2023-05-03 ENCOUNTER — Other Ambulatory Visit (HOSPITAL_COMMUNITY): Payer: Self-pay

## 2023-05-03 ENCOUNTER — Other Ambulatory Visit: Payer: Self-pay

## 2023-05-03 MED ORDER — MEGESTROL ACETATE 20 MG PO TABS
20.0000 mg | ORAL_TABLET | Freq: Two times a day (BID) | ORAL | 0 refills | Status: DC
  Filled 2023-05-03: qty 60, 30d supply, fill #0

## 2023-05-07 ENCOUNTER — Telehealth: Payer: Self-pay | Admitting: Internal Medicine

## 2023-05-07 NOTE — Telephone Encounter (Signed)
 I sent him a mychart message about all the what to eat and not to eat. Pt needs more clarification.   Copied from CRM [id number]. Topic: Clinical - Medication Question >> May 07, 2023 12:07 PM Ivette P wrote: Reason for CRM: pt called in to see about meals and would like some more clarification on exactly what he can and cannot eat. Pt stated he read the mychart message but feels as if it is very vague. He would like a more detailed clarification. Also he is not going to be home and would like to be reached at wifes cell number below.   [phone]

## 2023-05-09 ENCOUNTER — Encounter: Payer: Self-pay | Admitting: Internal Medicine

## 2023-05-09 NOTE — Telephone Encounter (Signed)
 I have sent this through mychart so they can see what he can and can't do

## 2023-05-14 ENCOUNTER — Ambulatory Visit: Admitting: Gastroenterology

## 2023-05-14 ENCOUNTER — Encounter: Payer: Self-pay | Admitting: Gastroenterology

## 2023-05-14 VITALS — BP 128/60 | HR 70 | Ht 72.0 in | Wt 150.8 lb

## 2023-05-14 DIAGNOSIS — R634 Abnormal weight loss: Secondary | ICD-10-CM

## 2023-05-14 DIAGNOSIS — R159 Full incontinence of feces: Secondary | ICD-10-CM

## 2023-05-14 DIAGNOSIS — K580 Irritable bowel syndrome with diarrhea: Secondary | ICD-10-CM | POA: Diagnosis not present

## 2023-05-14 MED ORDER — AMITRIPTYLINE HCL 10 MG PO TABS: 30.0000 mg | ORAL_TABLET | Freq: Every day | ORAL | Status: DC

## 2023-05-14 NOTE — Progress Notes (Unsigned)
 HPI : Jason Day is a 83 y.o. 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 He was last seen in by me in June for his initial visit, but we have had several long conversations over the phone since then discussing his symptoms and trying different medications.  Please see HPI from his initial visit in June for description of his history and symptoms.  Following his first visit, he was prescribed rifaximin, which which apparently did help transiently.  He was subsequently treated with colestipol, which helped initially, but then stopped working.  Viberzi was considered, but not felt to be an option because of his cholecystectomy status.  He was started on low-dose Elavil in December.  In January I had ordered an expanded evaluation to exclude other less likely causes of his symptoms and to assess for nutritional deficiencies, given his ongoing weight loss.  These tests have not been completed yet.   Stools formed for the most part Gas is main issue Weight is main issue Passing stool 3-4/day.  More recently, none Variable.   Still having occasional fecal incontinence  Increase Elavil, improved   Discussed the use of AI scribe software for clinical note transcription with the patient, who gave verbal consent to proceed.  History of Present Illness   Jason Day is an 83 year old male with chronic gastrointestinal symptoms who presents with persistent gas and bowel irregularities. He was referred by Dr. Clydell Hakim for evaluation of chronic gastrointestinal symptoms.  He has been experiencing ongoing gastrointestinal symptoms, primarily characterized by excessive gas and irregular bowel movements. The gas is particularly noticeable at night when he gets up to urinate, often passing gas before urination. Despite dietary modifications, including  cutting out pasta, dairy, and most greens, the gas persists. He has been on a probiotic regimen for 30 days and has also been using an appetite enhancer, but these interventions have not resolved his symptoms.  His bowel movements are irregular, with formed stools that are not diarrhea. He passes stools three to four times a day but sometimes goes days without passing any. He describes episodes where he feels the urge to defecate multiple times in a short period, with stools that are formed but come out in small pieces. He occasionally experiences stool incontinence, particularly at night, which prompts him to sit down to urinate to avoid accidents.  He has lost significant weight, dropping from 215 pounds to 149 pounds, although his weight has been stable over the past nine months. He is concerned about whether he will regain the lost weight, despite eating well.  He is currently taking amitriptyline, which he refers to as 'the little red pill', at a dose of 20 mg per day. This medication has helped reduce his reliance on Gas-X. He also reports a dry mouth, which he attributes to his medication, and has been avoiding citrus, which limits his options for managing dry mouth.  He sleeps approximately 17 hours a day and feels unusually cold, despite having good heart function and oxygen levels. No diarrhea.        ------------------------------------------------------------------------------------- HPI from initial encounter June 2024 Jason Day is a 83 y.o. male with a history of coronary artery disease status post CABG, atrial fibrillation and congestive heart failure who is referred to [place] by [facility], *for further evaluation and management of chronic diarrhea.  The  patient is accompanied by his wife who provides much of the history.  The patient has had problems with diarrhea with urgency and incontinence for about 4 years now.  His symptoms apparently started about 6 months after his  bypass surgery.  Patient's wife also reports significant weight loss going from 215 pounds now to 150 pounds over these four years.  Review of the patient's weights documented in the EMR indicate gradual weight loss since 2019, going from around 190 pounds to his current weight of 150 pounds.  His weights vary quite a bit from encounter to encounter, but overall it appears he has lost about 10 pounds in the past year. The patient attributes his weight loss to not eating.  He does not eat much, because the more that he eats, the more diarrhea and incontinence he has.  If he does not eat, his symptoms are much improved.   The patient describes significant fecal urgency that impairs his ability to leave the house.  He states that for days on and, he will have frequent urges to go and will often times not be able to get to the toilet in time.  He has symptoms most days of the week (estimates 5 days out of the week).  Symptoms are worse with larger meals.  He does not typically have nocturnal bowel movements, but he does wake up at night to urinate, and will often pass small amounts of stool with gas during these voids. In addition to the diarrhea and incontinence, he has significant bloating and flatulence.  He often passes stool with flatulence.  He denies significant abdominal pain. His stools are not bloody.  His stools are usually not watery either, but they are more "slimy" with little formed to them. No nausea or vomiting.  Constipation is never a problem for him.   Other than large meals causing worsening symptoms, he has not noted any other foods that cause worsening symptoms.  When asked if he had made any dietary changes, the patient's wife lists off a wide variety of foods that he he was recommended not to eat to include white bread, tomato sauce, dairy.  It is not clear if these dietary recommendations were made for heart health purposes or for his GI symptoms.  When pressed further, she states that  he did try a low FODMAP diet which was not helpful.  He continues to avoid lactose other than occasional cheese.  He has not tried strict gluten-free diet. He was previously followed by [facility] from 2019 until now.  I have limited records from [facility] at the time of his visit, which did include a colonoscopy in 2021 to evaluate hematochezia.  This colonoscopy was notable for internal hemorrhoids, but was otherwise normal.  Random biopsies were not taken at that time.  He did have a flexible sigmoidoscopy in June 2020 in which random biopsies were taken and did not show microscopic colitis.  An EGD in June 2021 showed gastric erythema with presence of bilious fluid in the stomach, but was otherwise normal. The patient does recall being given a powder which he took with meals which was not helpful.  He also recalls being given suppositories and "some pills", but he does not recall the names, he just recalls nothing helped.  He does not recall being diagnosed with SIBO or being given any antibiotics.  He does think that he has tried Imodium and Lomotil and they were not helpful.   Patient's wife states that  the patient needs a pacemaker, but she says he was "too thin" and was felt to be too high risk for anesthesia.  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       Weights: Today: 150lb Mar 29, 2022:  157lb Oct 12, 2021:  153lb May 24, 2021:  160lb Mar 08, 2021:  168lb Dec 27, 2020:  162lb August 31, 2020:  179lb Sept 30, 2021:  170lbs September 02, 2018: 190lbs     Saw Dr. Leone Payor in 2019 Was scheduled for colonoscopy, then switched to [facility] who he saw from 2019 to 2021 (Dr. Randa Evens, then Dr. Evette Cristal)   Colonoscopy August 04, 2019 (Dr. Evette Cristal) Rectal bleeding Internal hemorrhoids, otherwise normal colonoscopy   EGD August 04, 2019  (Dr. Evette Cristal) Gastric erythema, bilious fluid in stomach, otherwise normal   Flex Sig Jun 2020 Dr. Randa Evens Normal with random biopsies   Hemorrhoid banding in 2019 Dr. Leone Payor; all 3 columns   Colonoscopy 2016 Dr. Annia Friendly Maxson Left sided diverticulosis, otherwise normal   From Cardiology's note Feb 2024 Patient had CABG x 5 in 8/18. Post-op echo showed EF 35%. Since that time, LV EF has ranged from 25-35%. Most recent echo in 1/24 showed EF 25-30%, moderate LV dilation, severely decreased RV systolic function, severe LAE, moderate MR. He had post-op atrial fibrillation. He failed amiodarone and cardioversion, and it was decided not to pursue AF ablation. He is in permanent atrial fibrillation. In 12/23, he had a presyncopal episode. Zio monitor was placed showing AF rate ranging 29 - 100 bpm, 46 pauses with the longest 4 seconds (at night). Also 28 NSVT runs, longest 18 beats.     Past Medical History:  Diagnosis Date   Acute combined systolic and diastolic congestive heart failure (HCC) 10/16/2016   Allergic rhinitis 09/01/2020   Anal fissure    Angio-edema    Bleeding hemorrhoids    Chronic combined systolic and diastolic heart failure (HCC)    Chronic sinus complaints    overuses afrin   Coronary atherosclerosis of native coronary artery    Dyspnea    Encounter for medical examination to establish care 04/22/2017   Gallstones    Hyperlipidemia    Irritable bowel syndrome 06/07/2017   Ischemic cardiomyopathy    Left atrial enlargement    Long term (current) use of anticoagulants 12/30/2020   Moderate mitral regurgitation    Myocardial infarction (HCC) 09/2015   Persistent atrial fibrillation (HCC)    RBBB    A- Fib   Shortness of breath 12/29/2020   Urinary incontinence 12/30/2020     Past Surgical History:  Procedure Laterality Date   BACK SURGERY  ~2014   disc repair   CARDIOVERSION N/A 10/24/2018   Procedure: CARDIOVERSION;  Surgeon: Jake Bathe, MD;  Location:  [facility] ENDOSCOPY;  Service: Cardiovascular;  Laterality: N/A;   CHOLECYSTECTOMY     COLONOSCOPY  04/06/2014   Hemorrhoids and diverticulosis performed in [place]   COLONOSCOPY WITH PROPOFOL N/A 08/04/2019   Procedure: COLONOSCOPY WITH PROPOFOL;  Surgeon: Graylin Shiver, MD;  Location: [facility] ENDOSCOPY;  Service: Endoscopy;  Laterality: N/A;   CORONARY ARTERY BYPASS GRAFT N/A 10/18/2016   Procedure: CORONARY ARTERY BYPASS GRAFTING (CABG) x five , using left internal mammary artery and right leg greater saphenous vein harvested endoscopically;  Surgeon: Alleen Borne, MD;  Location:  [facility] OR;  Service: Open Heart Surgery;  Laterality: N/A;   ESOPHAGOGASTRODUODENOSCOPY (EGD) WITH PROPOFOL N/A 08/04/2019   Procedure: ESOPHAGOGASTRODUODENOSCOPY (EGD) WITH PROPOFOL;  Surgeon: Graylin Shiver, MD;  Location: [facility] ENDOSCOPY;  Service: Endoscopy;  Laterality: N/A;   FOOT SURGERY     HEMORRHOID BANDING     HEMORRHOID SURGERY     PACEMAKER IMPLANT N/A 04/02/2023   Procedure: PACEMAKER IMPLANT;  Surgeon: Lanier Prude, MD;  Location: [facility] INVASIVE CV LAB;  Service: Cardiovascular;  Laterality: N/A;   REVERSE SHOULDER ARTHROPLASTY Left 08/21/2018   Procedure: REVERSE SHOULDER ARTHROPLASTY;  Surgeon: Francena Hanly, MD;  Location: [facility] ORS;  Service: Orthopedics;  Laterality: Left;   RIGHT/LEFT HEART CATH AND CORONARY ANGIOGRAPHY N/A 10/16/2016   Procedure: RIGHT/LEFT HEART CATH AND CORONARY ANGIOGRAPHY;  Surgeon: Lyn Records, MD;  Location: [facility] INVASIVE CV LAB;  Service: Cardiovascular;  Laterality: N/A;   stents in liver     TEE WITHOUT CARDIOVERSION N/A 10/18/2016   Procedure: TRANSESOPHAGEAL ECHOCARDIOGRAM (TEE);  Surgeon: Alleen Borne, MD;  Location: [facility] OR;  Service: Open Heart Surgery;  Laterality: N/A;   TRANSURETHRAL RESECTION OF PROSTATE N/A 11/19/2019   Procedure: CYSTOSCOPY TRANSURETHRAL RESECTION OF THE PROSTATE (TURP);  Surgeon: Bjorn Pippin, MD;  Location: [facility] ORS;  Service: Urology;  Laterality: N/A;    Family History  Problem Relation Age of Onset   COPD Father    Emphysema Father    Healthy Brother    Diabetes Neg Hx    Cancer Neg Hx    Stomach cancer Neg Hx    Colon cancer Neg Hx    Allergic rhinitis Neg Hx    Angioedema Neg Hx    Asthma Neg Hx    Eczema Neg Hx    Urticaria Neg Hx    Social History   Tobacco Use   Smoking status: Former    Current packs/day: 0.00    Types: Cigarettes    Quit date: 1970    Years since quitting: 55.2    Passive exposure: Never   Smokeless tobacco: Never   Tobacco comments:    Quit in 1970  Vaping Use   Vaping status: Never Used  Substance Use Topics   Alcohol use: No   Drug use: No   Current Outpatient Medications  Medication Sig Dispense Refill   amitriptyline (ELAVIL) 10 MG tablet Take 2 tablets (20 mg total) by mouth at bedtime. 120 tablet 1   atorvastatin (LIPITOR) 10 MG tablet Take 1 tablet (10 mg total) by mouth daily. 90 tablet 1   colestipol (COLESTID) 1 g tablet TAKE 1TAB BY MOUTH 2TIMES DAILY*TAKE THIS MEDICATION AT LEAST 1HR AFTER TAKING YOUR OTHER MEDS. (Patient taking differently: Take 0.5 g by mouth daily.) 60 tablet 1   dapagliflozin propanediol (FARXIGA) 10 MG TABS tablet Take 1 tablet (10 mg total) by mouth daily before breakfast. 90 tablet 3   megestrol (MEGACE) 20 MG tablet Take 1 tablet (20 mg total) by mouth 2 (two) times daily. 60 tablet 0   metoprolol succinate (TOPROL-XL) 25 MG 24 hr tablet Take 0.5 tablets (12.5 mg total) by mouth daily. Take 12.5mg  daily     spironolactone (ALDACTONE) 25 MG tablet Take 1 tablet (25 mg total) by mouth daily. 90 tablet 1   warfarin (COUMADIN) 5 MG tablet Take 5 mg by mouth daily. Take 1 to 1.5 tablets tablet by mouth daily as directed by the coumadin clinic     No current facility-administered medications for this visit.  No Known Allergies   Review of Systems: All systems reviewed and negative except where noted in HPI.    CT ABDOMEN PELVIS WO CONTRAST Result Date:  04/29/2023 CLINICAL DATA:  Abdominal pain acute nonlocalized abdominal pain EXAM: CT ABDOMEN AND PELVIS WITHOUT CONTRAST TECHNIQUE: Multidetector CT imaging of the abdomen and pelvis was performed following the standard protocol without IV contrast. RADIATION DOSE REDUCTION: This exam was performed according to the departmental dose-optimization program which includes automated exposure control, adjustment of the mA and/or kV according to patient size and/or use of iterative reconstruction technique. COMPARISON:  KUB March 22, 2020 FINDINGS: Lower chest: Chronic left lower lobe hypoventilatory atelectasis. 4.9 by 4.7 cm round calcific density projects posterior to the heart adjacent to the inferior margin of the left ventricle may correlate with ventricular aneurysms. Hepatobiliary: No focal liver abnormality is seen. Gallbladder is not visualized findings may correlate with post cholecystectomy. No biliary dilatation. Pancreas: Unremarkable. No pancreatic ductal dilatation or surrounding inflammatory changes. Spleen: Normal in size without focal abnormality. Adrenals/Urinary Tract: Adrenal glands are unremarkable. Kidneys are normal, questionable 1 mm calcification left kidney lower pole calices could correlate with nonobstructing kidney stones. Focal lesion, or hydronephrosis. Bladder is unremarkable. Stomach/Bowel: Stomach is within normal limits. Appendix appears normal. No evidence of bowel wall thickening, distention, or inflammatory changes. Vascular/Lymphatic: Aortic atherosclerosis. No enlarged abdominal or pelvic lymph nodes. Reproductive: Prostate slightly enlarged elevating the trigone of the bladder Other: No abdominal wall hernia or abnormality. No abdominopelvic ascites. Musculoskeletal: No acute or significant osseous findings. Degenerative disc disease L5-S1. IMPRESSION: *No acute intra-abdominal or pelvic pathology. *4.9 x 4.7 cm round calcific density projects posterior to the heart adjacent to  the inferior margin of the left ventricle may correlate with ventricular aneurysms. *Questionable 1 mm calcification left kidney lower pole calices could correlate with nonobstructing kidney stones. *Prostate slightly enlarged elevating the trigone of the bladder. *Aortic atherosclerosis. Electronically Signed   By: Shaaron Adler M.D.   On: 04/29/2023 15:15   CUP PACEART INCLINIC DEVICE CHECK Result Date: 04/16/2023 Normal in-clinic Pacemaker check. Thresholds, sensing, and impedance WNL or stable for patient over time. No R waves at 30 bpm. No episodes. Estimated longevity 10 year, 8 mo. Pt enrolled in remote follow-up. bo   Physical Exam: There were no vitals taken for this visit. Constitutional: Pleasant,well-developed, ***male in no acute distress. HEENT: Normocephalic and atraumatic. Conjunctivae are normal. No scleral icterus. Neck supple.  Cardiovascular: Normal rate, regular rhythm.  Pulmonary/chest: Effort normal and breath sounds normal. No wheezing, rales or rhonchi. Abdominal: Soft, nondistended, nontender. Bowel sounds active throughout. There are no masses palpable. No hepatomegaly. Extremities: no edema Lymphadenopathy: No cervical adenopathy noted. Neurological: Alert and oriented to person place and time. Skin: Skin is warm and dry. No rashes noted. Psychiatric: Normal mood and affect. Behavior is normal.  CBC    Component Value Date/Time   WBC 6.1 04/01/2023 0801   RBC 4.53 04/01/2023 0801   HGB 14.1 04/01/2023 0801   HGB 14.7 10/12/2021 1027   HCT 41.5 04/01/2023 0801   HCT 43.9 10/12/2021 1027   PLT 138 (L) 04/01/2023 0801   PLT 110 (L) 10/12/2021 1027   MCV 91.6 04/01/2023 0801   MCV 93 10/12/2021 1027   MCH 31.1 04/01/2023 0801   MCHC 34.0 04/01/2023 0801   RDW 14.1 04/01/2023 0801   RDW 14.4 10/12/2021 1027   LYMPHSABS 1.2 03/30/2023 0222   LYMPHSABS 1.0 10/12/2021 1027   MONOABS 0.4 03/30/2023 0222   EOSABS  0.1 03/30/2023 0222   EOSABS 0.1 10/12/2021  1027   BASOSABS 0.0 03/30/2023 0222   BASOSABS 0.0 10/12/2021 1027    CMP     Component Value Date/Time   NA 140 04/09/2023 1147   K 4.2 04/09/2023 1147   CL 99 04/09/2023 1147   CO2 24 04/09/2023 1147   GLUCOSE 167 (H) 04/09/2023 1147   GLUCOSE 92 04/03/2023 0220   BUN 26 04/09/2023 1147   CREATININE 1.22 04/09/2023 1147   CALCIUM 9.0 04/09/2023 1147   PROT 6.3 (L) 03/30/2023 0222   PROT 6.9 10/12/2021 1027   ALBUMIN 3.6 03/30/2023 0222   ALBUMIN 4.5 10/12/2021 1027   AST 18 03/30/2023 0222   ALT 17 03/30/2023 0222   ALKPHOS 91 03/30/2023 0222   BILITOT 2.3 (H) 03/30/2023 0222   BILITOT 2.2 (H) 10/12/2021 1027   GFRNONAA 50 (L) 04/03/2023 0220   GFRAA >60 11/10/2019 1050       Latest Ref Rng & Units 04/01/2023    8:01 AM 03/30/2023    2:22 AM 03/29/2023    2:13 PM  CBC EXTENDED  WBC 4.0 - 10.5 K/uL 6.1  4.5    RBC 4.22 - 5.81 MIL/uL 4.53  4.16    Hemoglobin 13.0 - 17.0 g/dL 96.2  95.2  84.1   HCT 39.0 - 52.0 % 41.5  38.8  38.0   Platelets 150 - 400 K/uL 138  102    NEUT# 1.7 - 7.7 K/uL  2.7    Lymph# 0.7 - 4.0 K/uL  1.2        ASSESSMENT AND PLAN:   Assessment and Plan    Irritable Bowel Syndrome (IBS) Chronic IBS symptoms, including excessive gas and altered bowel habits, have persisted for years, exacerbated post-myocardial infarction. Current symptoms include frequent gas, formed stools, and occasional urgency without diarrhea. He is on a restricted diet and probiotics. Amitriptyline, a centrally acting agent on the gut-brain axis, has been increased to 30 mg at night to manage global IBS symptoms, though it may not directly reduce gas production. He reports dry mouth and increased sleep duration as side effects. The dosage increase aims to improve symptom management, reducing urgency and discomfort. - Increase amitriptyline to 30 mg at night. - Continue dietary modifications as per Dr. Georgiana Shore recommendations. - Monitor for side effects of amitriptyline,  including dry mouth and somnolence.  Fecal Incontinence Nocturnal fecal incontinence, possibly due to decreased muscle mass and weakened anal sphincter control with age. He experiences urgency and occasional accidents with formed stools. Stool bulking with fiber is discussed to optimize stool consistency and reduce incontinence, balanced against the risk of increased gas production. - Consider stool bulking agents if gas production can be managed. - Discuss potential surgical or nerve stimulator options if symptoms worsen.  Unintentional Weight Loss Weight loss from 215 lbs to 149 lbs over an extended period, with stabilization over the past nine months. The weight loss is not attributed to an undiagnosed condition, and his weight has remained stable with dietary changes and appetite enhancers. He is concerned about regaining weight, but weight gain is less likely with advancing age. - Continue monitoring weight and dietary intake. - Follow dietary recommendations to support weight maintenance.         Tysinger, Kermit Balo, PA-C

## 2023-05-14 NOTE — Patient Instructions (Signed)
 Increase your Elavil to 30 mg (3 tablets) at bedtime.   _______________________________________________________  If your blood pressure at your visit was 140/90 or greater, please contact your primary care physician to follow up on this.  _______________________________________________________  If you are age 83 or older, your body mass index should be between 23-30. Your Body mass index is 20.45 kg/m. If this is out of the aforementioned range listed, please consider follow up with your Primary Care Provider.  If you are age 24 or younger, your body mass index should be between 19-25. Your Body mass index is 20.45 kg/m. If this is out of the aformentioned range listed, please consider follow up with your Primary Care Provider.   ________________________________________________________  The [facility] providers would like to encourage you to use [facility] to communicate with providers for non-urgent requests or questions.  Due to long hold times on the telephone, sending your provider a message by [facility] may be a faster and more efficient way to get a response.  Please allow 48 business hours for a response.  Please remember that this is for non-urgent requests.  _______________________________________________________

## 2023-05-15 ENCOUNTER — Ambulatory Visit (INDEPENDENT_AMBULATORY_CARE_PROVIDER_SITE_OTHER): Payer: PPO

## 2023-05-15 DIAGNOSIS — I255 Ischemic cardiomyopathy: Secondary | ICD-10-CM | POA: Diagnosis not present

## 2023-05-15 LAB — CUP PACEART REMOTE DEVICE CHECK
Battery Voltage: 100
Date Time Interrogation Session: 20250326075132
Implantable Lead Connection Status: 753985
Implantable Lead Implant Date: 20250211
Implantable Lead Location: 753862
Implantable Lead Model: 377
Implantable Lead Serial Number: 8001791902
Implantable Pulse Generator Implant Date: 20250211
Pulse Gen Serial Number: 1000357520

## 2023-05-17 ENCOUNTER — Other Ambulatory Visit: Payer: Self-pay | Admitting: Gastroenterology

## 2023-05-17 ENCOUNTER — Ambulatory Visit: Attending: Cardiovascular Disease | Admitting: *Deleted

## 2023-05-17 ENCOUNTER — Other Ambulatory Visit (HOSPITAL_COMMUNITY): Payer: Self-pay

## 2023-05-17 DIAGNOSIS — Z5181 Encounter for therapeutic drug level monitoring: Secondary | ICD-10-CM

## 2023-05-17 DIAGNOSIS — Z951 Presence of aortocoronary bypass graft: Secondary | ICD-10-CM

## 2023-05-17 DIAGNOSIS — I4821 Permanent atrial fibrillation: Secondary | ICD-10-CM

## 2023-05-17 LAB — POCT INR: INR: 3.5 — AB (ref 2.0–3.0)

## 2023-05-17 MED ORDER — AMITRIPTYLINE HCL 10 MG PO TABS
20.0000 mg | ORAL_TABLET | Freq: Every day | ORAL | 1 refills | Status: DC
  Filled 2023-05-17 – 2023-05-23 (×3): qty 120, 60d supply, fill #0

## 2023-05-17 NOTE — Patient Instructions (Signed)
 Description   Hold warfarin today then START taking Warfarin 1 tablet daily. Recheck INR in 2 weeks per pt request (normally 6 weeks per pt request (aware of risk)  Call coumadin clinic for any changes in medications or upcoming procedures.  Coumadin Clinic [phone]

## 2023-05-20 ENCOUNTER — Other Ambulatory Visit (HOSPITAL_COMMUNITY): Payer: Self-pay

## 2023-05-20 ENCOUNTER — Other Ambulatory Visit: Payer: Self-pay | Admitting: Cardiovascular Disease

## 2023-05-23 ENCOUNTER — Other Ambulatory Visit (HOSPITAL_COMMUNITY): Payer: Self-pay

## 2023-05-24 ENCOUNTER — Other Ambulatory Visit: Payer: Self-pay

## 2023-05-24 ENCOUNTER — Encounter (INDEPENDENT_AMBULATORY_CARE_PROVIDER_SITE_OTHER): Payer: Self-pay | Admitting: Otolaryngology

## 2023-05-24 ENCOUNTER — Other Ambulatory Visit: Payer: Self-pay | Admitting: Gastroenterology

## 2023-05-24 ENCOUNTER — Telehealth: Payer: Self-pay | Admitting: Cardiovascular Disease

## 2023-05-24 ENCOUNTER — Ambulatory Visit: Payer: PPO | Admitting: Internal Medicine

## 2023-05-24 ENCOUNTER — Telehealth: Payer: Self-pay | Admitting: *Deleted

## 2023-05-24 ENCOUNTER — Encounter: Payer: Self-pay | Admitting: Internal Medicine

## 2023-05-24 ENCOUNTER — Other Ambulatory Visit (HOSPITAL_COMMUNITY): Payer: Self-pay

## 2023-05-24 ENCOUNTER — Other Ambulatory Visit: Payer: Self-pay | Admitting: Internal Medicine

## 2023-05-24 VITALS — BP 100/58 | HR 70 | Temp 97.1°F | Resp 18

## 2023-05-24 DIAGNOSIS — I5042 Chronic combined systolic (congestive) and diastolic (congestive) heart failure: Secondary | ICD-10-CM

## 2023-05-24 DIAGNOSIS — J310 Chronic rhinitis: Secondary | ICD-10-CM

## 2023-05-24 DIAGNOSIS — H9113 Presbycusis, bilateral: Secondary | ICD-10-CM | POA: Diagnosis not present

## 2023-05-24 DIAGNOSIS — R42 Dizziness and giddiness: Secondary | ICD-10-CM

## 2023-05-24 DIAGNOSIS — H6993 Unspecified Eustachian tube disorder, bilateral: Secondary | ICD-10-CM

## 2023-05-24 DIAGNOSIS — K529 Noninfective gastroenteritis and colitis, unspecified: Secondary | ICD-10-CM | POA: Diagnosis not present

## 2023-05-24 DIAGNOSIS — K9089 Other intestinal malabsorption: Secondary | ICD-10-CM | POA: Diagnosis not present

## 2023-05-24 DIAGNOSIS — K580 Irritable bowel syndrome with diarrhea: Secondary | ICD-10-CM

## 2023-05-24 MED ORDER — IPRATROPIUM BROMIDE 0.06 % NA SOLN
2.0000 | Freq: Two times a day (BID) | NASAL | 5 refills | Status: DC | PRN
  Filled 2023-05-24: qty 15, 38d supply, fill #0

## 2023-05-24 MED ORDER — AZELASTINE-FLUTICASONE 137-50 MCG/ACT NA SUSP
1.0000 | Freq: Two times a day (BID) | NASAL | 0 refills | Status: DC
  Filled 2023-05-24: qty 23, 30d supply, fill #0

## 2023-05-24 MED ORDER — AMITRIPTYLINE HCL 10 MG PO TABS
30.0000 mg | ORAL_TABLET | Freq: Every day | ORAL | 1 refills | Status: DC
Start: 2023-05-24 — End: 2023-07-23
  Filled 2023-05-24 – 2023-06-19 (×2): qty 90, 30d supply, fill #0

## 2023-05-24 MED ORDER — AMITRIPTYLINE HCL 10 MG PO TABS
30.0000 mg | ORAL_TABLET | Freq: Every day | ORAL | 1 refills | Status: DC
  Filled 2023-05-24 (×2): qty 90, 30d supply, fill #0

## 2023-05-24 MED ORDER — IPRATROPIUM BROMIDE 0.06 % NA SOLN
2.0000 | Freq: Four times a day (QID) | NASAL | 12 refills | Status: DC
  Filled 2023-05-24: qty 15, 19d supply, fill #0

## 2023-05-24 NOTE — Telephone Encounter (Signed)
 Left message for patient to call back.  Checked through chart. Sherryll Burger was discontinued after discharge from hospital per note "Restart Entresto at follow-up if blood pressure tolerates" on 04/12/23. Patient had follow up with Canary Brim, NP and BP was 110/78. Will see if Dr. Eden Emms thinks patient needs to go back on Entresto.

## 2023-05-24 NOTE — Progress Notes (Signed)
 FOLLOW UP Date of Service/Encounter:  05/24/23  Subjective:  Jason Day (DOB: 07-20-1940) is a 83 y.o. male who [facility] in re-evaluation of the following: chronic rhinitis  History obtained from: chart review and patient and Jason Day  .  For Review, LV was on 03/15/23 with Jason Day seen for intial visit for chronic rhinitis and diarrhea  . See below for summary of history and diagnostics.   Therapeutic plans/changes recommended: decrease afrin, start atrovent and blood work obtained which was negative to environmentals   Since last visit he was admitted for symptomatic bradycardia and underwent permanent pacemaker implantation.  He had a CT of the abdomen pelvis which was unremarkable.  Saw GI on 05/14/2023: Plan was to increase amitriptyline to 30 mg at night.  Continue dietary modifications.  Continue to monitor weight ----------------------------------------------------- Pertinent History/Diagnostics:  Chronic Rhinitis:  - sIgE; negative (02/07/22)  - CT Head : clear sinuses   Other: chronic diarrhea (follows with Jason Day in GI), heart failure (EF 25%)  Allergy test (1-72): negative to all foods.  --------------------------------------------------- Today presents for follow-up. Discussed the use of AI scribe software for clinical note transcription with the patient, who gave verbal consent to proceed.  History of Present Illness   Discussed the use of AI scribe software for clinical note transcription with the patient, who gave verbal consent to proceed.  History of Present Illness Jason Day is an 83 year old male who presents with nasal congestion and dizziness. He is accompanied by his caregiver.  He experiences persistent nasal congestion, particularly severe in the mornings and after meals, described as 'a river that flows out of me.' He uses Afrin nasal spray, primarily at night, to alleviate the congestion, using one spray  per nostril per night. He has difficulty breathing through his nose, especially upon waking at 3 AM, and relies on Afrin to open his nasal passages.  He experiences dizziness, described as lightheadedness upon standing, requiring him to grab onto something for stability. No room-spinning vertigo, but he describes a sensation akin to being on a boat. 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  He experiences hearing difficulties, which he attributes to his ears feeling 'full.' He uses hearing aids but still struggles with understanding speech at times. He drinks plenty of water as advised by previous doctors.  He has a pacemaker and reports that his weight has stabilized recently. He is on an appetite enhancer and a probiotic, which have improved his eating habits. He notes that his sleep has improved, with less uncontrollable sleeping, which was previously attributed to bradycardia.    All medications reviewed by clinical staff and updated in chart. No new pertinent medical or surgical history except as noted in HPI.  ROS: All others negative except as noted per HPI.   Objective:  BP (!) 100/58 (BP Location: Left Arm, Patient Position: Sitting, Cuff Size: Normal)   Pulse 70   Temp (!) 97.1 F (36.2 C) (Temporal)   Resp 18   SpO2 97%  There is no height or weight on file to calculate BMI. Physical Exam: General Appearance:  Alert, cooperative, no distress, appears stated age  Head:  Normocephalic, without obvious abnormality, atraumatic  Eyes:  Conjunctiva clear, EOM's intact  Ears EACs normal bilaterally  Nose: Nares normal,     Throat: Lips, tongue normal; teeth and gums normal,     Neck: Supple, symmetrical  Lungs:     ,  Respirations unlabored,     Heart:    , Appears well perfused  Extremities: No edema  Skin: Skin color, texture, turgor normal and no rashes or lesions on visualized portions of skin  Neurologic: No gross deficits   Labs:  Lab Orders  No  laboratory test(s) ordered today     Skin Testing: Food allergy panel. Adequate positive and negative controls. Results discussed with patient/family.    Allergy testing results were read and interpreted by myself, documented by clinical staff.  Assessment/Plan  Chronic Rhinitis: nonallergic  Chronic nasal congestion and rhinorrhea, exacerbated post myocardial infarction. Overuse of Afrin (Oxymetazoline) leading to rebound congestion. -Continue Afrin to one spray per nostril at night to prevent rebound congestion. -Continue Ipratropium bromide (Atrovent) nasal spray for rhinorrhea, one spray per nostril twice a day as needed, especially before meals.    Diarrhea/ Irritable Bowel Syndrome (IBS) Chronic symptoms of urgency, gas, and diarrhea, particularly after eating. Significant dietary modifications have been made with no improvement. Weight loss noted. 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 No current complaints of dyspnea or chest pain. -Continue current cardiac medications as prescribed by cardiologist.   Follow up:  in 6 months, we will call you with GI recommendation   Other:     Thank you so much for letting me partake in your care today.  Don't hesitate to reach out if you have any additional concerns!  Jason Luz, MD  [facility]- , [place]

## 2023-05-24 NOTE — Telephone Encounter (Signed)
 Follow Up:       He says the prescription goes to [facility]

## 2023-05-24 NOTE — Telephone Encounter (Signed)
 Per Dr. Marlynn Perking please refer to Dr. Allena Katz with [facility] for Vertigo and hearing loss: Eustachian tube dysfunction possible - Will refer to Dr. Allena Katz at [facility]

## 2023-05-24 NOTE — Telephone Encounter (Signed)
*  STAT* If patient is at the pharmacy, call can be transferred to refill team.   1. Which medications need to be refilled? (please list name of each medication and dose if known) new written prescription for [facility]- he says he have been approved by  the [employer] for his Entresto   2. Would you like to learn more about the convenience, safety, & potential cost savings by using the [facility]?    3. Are you open to using the [facility] (Type [facility].    4. Which pharmacy/location (including street and city if local pharmacy) is medication to be sent to?   5. Do they need a 30 day or 90 day supply? 90 days #180  and refills

## 2023-05-24 NOTE — Telephone Encounter (Signed)
 Spoke with pt who stated he is trying to pick up his Jason Day but there is an issue at the [facility]. It appears Jason Day is not on the pt's current medication list and was discontinued. Pt states he was unable to get Jason Day previously because he had been using a grant to get the medication and the grant ran out. Will forward to Dr. Fabio Bering nurse to review.

## 2023-05-24 NOTE — Patient Instructions (Addendum)
 Chronic Rhinitis: nonallergic  Chronic nasal congestion and rhinorrhea, exacerbated post myocardial infarction. Overuse of Afrin (Oxymetazoline) leading to rebound congestion. -Continue Afrin to one spray per nostril at night to prevent rebound congestion. -Continue Ipratropium bromide (Atrovent) nasal spray for rhinorrhea, one spray per nostril twice a day as needed, especially before meals. - Start dymista 1 spray per nostril twice daily. Aim upward and outward.    Diarrhea/ Irritable Bowel Syndrome (IBS) Chronic symptoms of urgency, gas, and diarrhea, particularly after eating. Significant dietary modifications have been made with no improvement. Weight loss noted. - Allergy test (03/15/23): negative to all foods  - Continue to follow with Dr. Tomasa Rand -  Vertigo and hearing loss: Eustachian tube dysfunction possible - Will refer to Dr. 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 No current complaints of dyspnea or chest pain. -Continue current cardiac medications as prescribed by cardiologist.   Follow up: 6 months   Thank you so much for letting me partake in your care today.  Don't hesitate to reach out if you have any additional concerns!  Ferol Luz, MD  [facility]- [place], [place]

## 2023-05-25 ENCOUNTER — Other Ambulatory Visit (HOSPITAL_COMMUNITY): Payer: Self-pay

## 2023-05-27 ENCOUNTER — Other Ambulatory Visit (HOSPITAL_COMMUNITY): Payer: Self-pay

## 2023-05-27 ENCOUNTER — Other Ambulatory Visit: Payer: Self-pay | Admitting: Cardiovascular Disease

## 2023-05-27 ENCOUNTER — Telehealth: Payer: Self-pay

## 2023-05-27 MED ORDER — ENTRESTO 24-26 MG PO TABS
1.0000 | ORAL_TABLET | Freq: Two times a day (BID) | ORAL | 3 refills | Status: DC
  Filled 2023-05-27: qty 180, 90d supply, fill #0

## 2023-05-27 NOTE — Telephone Encounter (Signed)
*  Asthma/Allergy  Pharmacy Patient Advocate Encounter   Received notification from CoverMyMeds that prior authorization for Azelastine-Fluticasone 137-50MCG/ACT suspension  is required/requested.   Insurance verification completed.   The patient is insured through [employer] ADVANTAGE/RX ADVANCE .   Per test claim: PA required; PA started via CoverMyMeds. KEY [id number] . Waiting for clinical questions to populate.

## 2023-05-27 NOTE — Telephone Encounter (Signed)
 Patient called again to follow-up on getting Entresto medication.

## 2023-05-27 NOTE — Telephone Encounter (Signed)
 Pt is calling back to check the status of this medication. Please advise

## 2023-05-27 NOTE — Addendum Note (Signed)
 Addended by: Virl Axe, Adwoa Axe L on: 05/27/2023 01:24 PM   Modules accepted: Orders

## 2023-05-27 NOTE — Telephone Encounter (Signed)
 Called patient back to let him know Dr. Eden Emms is fine with him starting back on his Entresto at the lowest dose. Patient stated he wanted to be on the higher dose. Informed patient that he needs to monitor his BP's after starting Entresto gain and see if he is tolerating it. Encouraged patient to call back in a week and report his BPs, and we would see if he needs to increase his dose. Patient verbalized understanding and agreed to plan.

## 2023-05-28 ENCOUNTER — Telehealth: Payer: Self-pay | Admitting: Internal Medicine

## 2023-05-28 ENCOUNTER — Other Ambulatory Visit (HOSPITAL_COMMUNITY): Payer: Self-pay

## 2023-05-28 NOTE — Telephone Encounter (Signed)
 Pt wife was notified

## 2023-05-28 NOTE — Telephone Encounter (Signed)
 Copied from CRM [id number]. Topic: Clinical - Medication Question >> May 28, 2023 10:52 AM Pierre Bali B wrote: Reason for CRM: PT called in to see if it would be okay to take zyrtec with other medications that he is on . He said he has allergies real bad right now . If someone can give him a call or wife a call for this matter .   Wife number - [phone]

## 2023-05-30 ENCOUNTER — Encounter

## 2023-05-31 ENCOUNTER — Other Ambulatory Visit: Payer: Self-pay | Admitting: Medical

## 2023-05-31 ENCOUNTER — Other Ambulatory Visit: Payer: Self-pay

## 2023-05-31 ENCOUNTER — Ambulatory Visit: Attending: Cardiology | Admitting: *Deleted

## 2023-05-31 ENCOUNTER — Other Ambulatory Visit (HOSPITAL_COMMUNITY): Payer: Self-pay

## 2023-05-31 DIAGNOSIS — I4821 Permanent atrial fibrillation: Secondary | ICD-10-CM

## 2023-05-31 DIAGNOSIS — Z5181 Encounter for therapeutic drug level monitoring: Secondary | ICD-10-CM | POA: Diagnosis not present

## 2023-05-31 LAB — POCT INR: INR: 2.3 (ref 2.0–3.0)

## 2023-05-31 MED ORDER — MEGESTROL ACETATE 20 MG PO TABS
20.0000 mg | ORAL_TABLET | Freq: Two times a day (BID) | ORAL | 2 refills | Status: DC
  Filled 2023-05-31: qty 60, 30d supply, fill #0
  Filled 2023-06-24: qty 60, 30d supply, fill #1

## 2023-05-31 NOTE — Patient Instructions (Signed)
 Description   Continue taking Warfarin 1 tablet daily. Recheck INR in 4 weeks per pt request (normally 6 weeks per pt request (aware of risk)  Call coumadin clinic for any changes in medications or upcoming procedures.  Coumadin Clinic [phone]

## 2023-06-03 ENCOUNTER — Other Ambulatory Visit (HOSPITAL_COMMUNITY): Payer: Self-pay

## 2023-06-04 ENCOUNTER — Other Ambulatory Visit (HOSPITAL_COMMUNITY): Payer: Self-pay

## 2023-06-05 NOTE — Telephone Encounter (Signed)
 New request created as previous request did not load additional question- New CMM Key: [id number]- clinical questions loaded and answered

## 2023-06-07 ENCOUNTER — Other Ambulatory Visit: Payer: Self-pay

## 2023-06-07 NOTE — Telephone Encounter (Signed)
 Submitted e-appeal due to preferred dx of "Allergic Rhinitis"

## 2023-06-12 ENCOUNTER — Other Ambulatory Visit: Payer: Self-pay

## 2023-06-12 NOTE — Telephone Encounter (Signed)
 Appeal approved through 02/19/2024. Approval letter attached in patients media

## 2023-06-17 ENCOUNTER — Other Ambulatory Visit (HOSPITAL_COMMUNITY): Payer: Self-pay

## 2023-06-19 ENCOUNTER — Other Ambulatory Visit: Payer: Self-pay | Admitting: Internal Medicine

## 2023-06-19 ENCOUNTER — Other Ambulatory Visit (HOSPITAL_COMMUNITY): Payer: Self-pay

## 2023-06-19 ENCOUNTER — Other Ambulatory Visit: Payer: Self-pay

## 2023-06-20 ENCOUNTER — Other Ambulatory Visit (HOSPITAL_COMMUNITY): Payer: Self-pay

## 2023-06-21 ENCOUNTER — Other Ambulatory Visit (HOSPITAL_COMMUNITY): Payer: Self-pay

## 2023-06-24 ENCOUNTER — Other Ambulatory Visit: Payer: Self-pay | Admitting: Cardiovascular Disease

## 2023-06-24 ENCOUNTER — Telehealth: Payer: Self-pay | Admitting: Cardiovascular Disease

## 2023-06-24 ENCOUNTER — Other Ambulatory Visit (HOSPITAL_COMMUNITY): Payer: Self-pay

## 2023-06-24 ENCOUNTER — Other Ambulatory Visit: Payer: Self-pay

## 2023-06-24 MED ORDER — AZELASTINE HCL 0.1 % NA SOLN
2.0000 | Freq: Two times a day (BID) | NASAL | 5 refills | Status: DC
  Filled 2023-06-24: qty 30, 50d supply, fill #0

## 2023-06-24 MED ORDER — FLUTICASONE PROPIONATE 50 MCG/ACT NA SUSP
2.0000 | Freq: Every day | NASAL | 2 refills | Status: DC
  Filled 2023-06-24: qty 16, 30d supply, fill #0

## 2023-06-24 NOTE — Telephone Encounter (Signed)
 Patient was instructed to continue metoprolol  succinate 12.5 mg (1/2 tablet ) by mouth daily after his discharge from hospital, when patient got his pacemaker. Will send in refill for patient as long as okay with Dr. Nishan.   Below is from medication list from discharge on 04/03/23  metoprolol  succinate 25 MG 24 hr tablet Commonly known as: TOPROL -XL Take 0.5 tablets (12.5 mg total) by mouth daily. Take 12.5mg  daily What changed:  how much to take Another medication with the same name was removed. Continue taking this medication, and follow the directions you see here.

## 2023-06-24 NOTE — Telephone Encounter (Signed)
 Out of medication over weekend Pt c/o medication issue:  1. Name of Medication:  Metoprolol    2. How are you currently taking this medication (dosage and times per day)?   3. Are you having a reaction (difficulty breathing--STAT)?   4. What is your medication issue?  Patient would like to know if he should be taking Metoprolol . Please advise.

## 2023-06-25 ENCOUNTER — Other Ambulatory Visit: Payer: Self-pay

## 2023-06-25 ENCOUNTER — Other Ambulatory Visit (HOSPITAL_COMMUNITY): Payer: Self-pay

## 2023-06-25 MED ORDER — METOPROLOL SUCCINATE ER 25 MG PO TB24
12.5000 mg | ORAL_TABLET | Freq: Every day | ORAL | 3 refills | Status: DC
  Filled 2023-06-25: qty 45, 90d supply, fill #0
  Filled 2023-09-16: qty 45, 90d supply, fill #1
  Filled 2023-12-13: qty 45, 90d supply, fill #2

## 2023-06-25 NOTE — Telephone Encounter (Signed)
 Dr. Stann Earnest okay with patient taking metoprolol  succinate 12.5 mg by mouth daily.

## 2023-06-27 ENCOUNTER — Other Ambulatory Visit (HOSPITAL_COMMUNITY): Payer: Self-pay

## 2023-06-27 ENCOUNTER — Telehealth: Payer: Self-pay

## 2023-06-27 NOTE — Progress Notes (Signed)
 Remote pacemaker transmission.

## 2023-06-27 NOTE — Telephone Encounter (Signed)
 Pt will stop by the office tomorrow to get help with his home remote monitor.

## 2023-06-28 ENCOUNTER — Ambulatory Visit: Attending: Cardiovascular Disease

## 2023-06-28 DIAGNOSIS — I4821 Permanent atrial fibrillation: Secondary | ICD-10-CM | POA: Diagnosis not present

## 2023-06-28 DIAGNOSIS — Z5181 Encounter for therapeutic drug level monitoring: Secondary | ICD-10-CM

## 2023-06-28 LAB — POCT INR: INR: 2.7 (ref 2.0–3.0)

## 2023-06-28 NOTE — Patient Instructions (Signed)
 Continue taking Warfarin 1 tablet daily. Recheck INR in 6 weeks per pt request (normally 6 weeks per pt request (aware of risk)  Call coumadin  clinic for any changes in medications or upcoming procedures.  Coumadin  Clinic [phone]

## 2023-07-01 ENCOUNTER — Encounter: Payer: Self-pay | Admitting: Student

## 2023-07-01 ENCOUNTER — Other Ambulatory Visit (HOSPITAL_COMMUNITY): Payer: Self-pay

## 2023-07-01 ENCOUNTER — Ambulatory Visit: Payer: PPO | Attending: Student | Admitting: Student

## 2023-07-01 VITALS — BP 82/56 | HR 74 | Ht 72.0 in | Wt 145.0 lb

## 2023-07-01 DIAGNOSIS — R001 Bradycardia, unspecified: Secondary | ICD-10-CM | POA: Diagnosis not present

## 2023-07-01 DIAGNOSIS — I255 Ischemic cardiomyopathy: Secondary | ICD-10-CM

## 2023-07-01 DIAGNOSIS — Z951 Presence of aortocoronary bypass graft: Secondary | ICD-10-CM

## 2023-07-01 DIAGNOSIS — I4821 Permanent atrial fibrillation: Secondary | ICD-10-CM | POA: Diagnosis not present

## 2023-07-01 LAB — CUP PACEART INCLINIC DEVICE CHECK
Date Time Interrogation Session: 20250512124845
Implantable Lead Connection Status: 753985
Implantable Lead Implant Date: 20250211
Implantable Lead Location: 753862
Implantable Lead Model: 377
Implantable Lead Serial Number: 8001791902
Implantable Pulse Generator Implant Date: 20250211
Pulse Gen Serial Number: 1000357520

## 2023-07-01 NOTE — Patient Instructions (Signed)
 Medication Instructions:  Your physician recommends that you continue on your current medications as directed. Please refer to the Current Medication list given to you today.  *If you need a refill on your cardiac medications before your next appointment, please call your pharmacy*  Lab Work: None ordered If you have labs (blood work) drawn today and your tests are completely normal, you will receive your results only by: MyChart Message (if you have MyChart) OR A paper copy in the mail If you have any lab test that is abnormal or we need to change your treatment, we will call you to review the results.  Follow-Up: At [facility], you and your health needs are our priority.  As part of our continuing mission to provide you with exceptional heart care, our providers are all part of one team.  This team includes your primary Cardiologist (physician) and Advanced Practice Providers or APPs (Physician Assistants and Nurse Practitioners) who all work together to provide you with the care you need, when you need it.  Your next appointment:   1 year(s)  Provider:   You will see one of the following Advanced Practice Providers on your designated Care Team:   Mertha Abrahams, [place] [address] [place], [place] Creighton Doffing, NP

## 2023-07-01 NOTE — Progress Notes (Signed)
  Electrophysiology Office Note:   ID:  Elemer Guimond, DOB Sep 11, 1940, MRN [id number]  Primary Cardiologist: Janelle Mediate, MD Electrophysiologist: Boyce Byes, MD      History of Present Illness:   Jason Day is a 83 y.o. male with h/o permanent AF on coumadin , CAD s/p CABG, ICM, and CHB seen today for routine electrophysiology follow-up s/p PPM 03/2023.  Since last being seen in our clinic the patient reports doing well from a cardiac perspective. Does have some chronic imbalance and dizziness complicated by ear issues. Otherwise, he denies chest pain, palpitations, dyspnea, PND, orthopnea, nausea, vomiting, syncope, edema, weight gain, or early satiety.  BP low today.   Review of systems complete and found to be negative unless listed in HPI.   EP Information / Studies Reviewed:    EKG is ordered today. Personal review as below.  EKG Interpretation Date/Time:  Monday Jul 01 2023 10:09:10 EDT Ventricular Rate:  74 PR Interval:    QRS Duration:  178 QT Interval:  472 QTC Calculation: 523 R Axis:   -84  Text Interpretation: Ventricular-paced rhythm with occasional Premature ventricular complexes When compared with ECG of 03-Apr-2023 06:51, Vent. rate has increased BY   2 BPM Confirmed by Pilar Bridge [phone]) on 07/01/2023 11:01:36 AM    PPM Interrogation-  reviewed in detail today,  See PACEART report.  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 / permanent AF   Physical Exam:   VS:  BP (!) 82/56 Comment: provider notified  Pulse 74   Ht 6' (1.829 m)   Wt 145 lb (65.8 kg)   BMI 19.67 kg/m    Wt Readings from Last 3 Encounters:  07/01/23 145 lb (65.8 kg)  05/14/23 150 lb 12.8 oz (68.4 kg)  04/16/23 151 lb 9.6 oz (68.8 kg)     GEN: No acute distress  NECK: No JVD; No carotid bruits CARDIAC: Regular rate and rhythm, no murmurs, rubs, gallops RESPIRATORY:  Clear to auscultation without rales, wheezing or rhonchi  ABDOMEN: Soft, non-tender,  non-distended EXTREMITIES:  No edema; No deformity   ASSESSMENT AND PLAN:    CHB s/p Biotronik PPM  Normal PPM function See Pace Art report Threshold programmed to chronic settings with battery improved to 10 years from 6.5.  Permanent AF Rates controlled -> CHB Continue coumadin  for CHA2DS2/VASc of at least 5  Chronic systolic CHF Continue GDMT as tolerated.  May need to consider alternate med to Entresto  if continues to have soft pressures, asymptomatic thus far, but could be contributing to chronic dizziness.   Secondary hypercoagulable state Pt on Coumadin  as above    Disposition:   Follow up with EP APP in 12 months  Signed, Tylene Galla, PA-C

## 2023-07-02 ENCOUNTER — Other Ambulatory Visit (HOSPITAL_COMMUNITY): Payer: Self-pay

## 2023-07-16 ENCOUNTER — Encounter: Payer: Self-pay | Admitting: Family Medicine

## 2023-07-16 ENCOUNTER — Ambulatory Visit (INDEPENDENT_AMBULATORY_CARE_PROVIDER_SITE_OTHER): Admitting: Family Medicine

## 2023-07-16 VITALS — BP 120/60 | HR 74 | Temp 97.6°F | Ht 72.0 in | Wt 153.8 lb

## 2023-07-16 DIAGNOSIS — J310 Chronic rhinitis: Secondary | ICD-10-CM

## 2023-07-16 DIAGNOSIS — R051 Acute cough: Secondary | ICD-10-CM | POA: Diagnosis not present

## 2023-07-16 LAB — POC COVID19 BINAXNOW: SARS Coronavirus 2 Ag: NEGATIVE

## 2023-07-16 NOTE — Patient Instructions (Signed)
  I recommend continuing with the robitussin products that you are taking. Return if you develop any fever, worsening shortness of breath, discolored mucus (from the nose) or phlegm (what you cough up). This could indicate an infection that might need antibiotics. Sometimes the runny nose can drain down the back of the throat, and contribute to coughing. Consider restarting one of your nasal sprays if your cough isn't improving (ipratroprium is the one I would restart, if needed). There is an antihistamine in the nighttime Robitussin that you are taking, and that might be helping at night.

## 2023-07-16 NOTE — Progress Notes (Signed)
 Chief Complaint  Patient presents with   Cough    Started Fri evening in to Sat am. No fever. No home covid tests have been done. (Patient has some memory issues-wife is here to see SB, she is in building if needed).    2 nights ago he started coughing at night (woke him up). Hasn't been able to expectorate any phlegm. Robitussin helps.  He is using Daytime version (contains guaifenesin and dextromethorphan) and nighttime version (dextromethorphan and doxylamine).  These seem to help a lot. +runny nose (chronic)--this remains clear, no discolored mucus.  Some chronic chills, nothing new. Denies fever. +sick contact--wife has been coughing. She is seeing another provider in the office today.  PMH, PSH, SH reviewed--  CHF, CAD, afib, pacemaker  CXR from 03/2023 (prior to pacemaker) FINDINGS: Normal lung volumes. Mild bilateral interstitial patchy opacities. No pleural effusion or pneumothorax. Similar enlarged cardiomediastinal silhouette. Left shoulder arthroplasty. Median sternotomy wires are nondisplaced.   IMPRESSION: 1. Mild bilateral interstitial and patchy opacities, which may represent pulmonary edema. 2. Similar cardiomegaly.   Outpatient Encounter Medications as of 07/16/2023  Medication Sig Note   amitriptyline  (ELAVIL ) 10 MG tablet Take 3 tablets (30 mg total) by mouth at bedtime.    atorvastatin  (LIPITOR) 10 MG tablet Take 1 tablet (10 mg total) by mouth daily.    dapagliflozin  propanediol (FARXIGA ) 10 MG TABS tablet Take 1 tablet (10 mg total) by mouth daily before breakfast.    Dextromethorphan-guaiFENesin (ROBITUSSIN DM PO) Take 15 mLs by mouth as needed. 07/16/2023: Last dose last night   metoprolol  succinate (TOPROL -XL) 25 MG 24 hr tablet Take 0.5 tablets (12.5 mg total) by mouth daily. Take 12.5mg  daily    sacubitril -valsartan  (ENTRESTO ) 24-26 MG Take 1 tablet by mouth 2 (two) times daily.    spironolactone  (ALDACTONE ) 25 MG tablet Take 1 tablet (25 mg total) by  mouth daily.    warfarin (COUMADIN ) 5 MG tablet Take 5 mg by mouth daily. Take 1 to 1.5 tablets tablet by mouth daily as directed by the coumadin  clinic    azelastine  (ASTELIN ) 0.1 % nasal spray Place 2 sprays into both nostrils 2 (two) times daily. (Patient not taking: Reported on 07/16/2023)    Azelastine -Fluticasone  137-50 MCG/ACT SUSP Place 1 spray into the nose 2 (two) times daily. (Patient not taking: Reported on 07/16/2023)    colestipol  (COLESTID ) 1 g tablet TAKE 1TAB BY MOUTH 2TIMES DAILY*TAKE THIS MEDICATION AT LEAST 1HR AFTER TAKING YOUR OTHER MEDS. (Patient not taking: Reported on 07/16/2023)    fluticasone  (FLONASE ) 50 MCG/ACT nasal spray Place 2 sprays into both nostrils daily. (Patient not taking: Reported on 07/16/2023)    ipratropium (ATROVENT ) 0.06 % nasal spray Place 2 sprays into both nostrils 2 (two) times daily as needed for rhinitis. (Patient not taking: Reported on 07/16/2023)    megestrol  (MEGACE ) 20 MG tablet Take 1 tablet (20 mg total) by mouth 2 (two) times daily. (Patient not taking: Reported on 07/16/2023)    Probiotic Product (ALIGN PO) Take 1 capsule by mouth daily. (Patient not taking: Reported on 07/16/2023)    [DISCONTINUED] ipratropium (ATROVENT ) 0.06 % nasal spray Place 2 sprays into both nostrils 4 (four) times daily.    No facility-administered encounter medications on file as of 07/16/2023.   No Known Allergies  ROS: no fever, Cough and runny nose per HPI. No chest pain, palpitations, tachycardia. DOE (stairs), unchanged. No edema. No n/v/d. Sometimes notes some BRB in stool (reports other doctors are aware). No rashes.  +bruising  PHYSICAL EXAM:  BP 120/60   Pulse 74   Temp 97.6 F (36.4 C) (Tympanic)   Ht 6' (1.829 m)   Wt 153 lb 12.8 oz (69.8 kg)   BMI 20.86 kg/m   Pleasant, well-appearing elderly male in no distress.  Unaccompanied. HEENT: PERRL, EOMI, conjunctiva and sclera are clear, EOMI. Nasal mucosa is mildly edematous with clear mucus. No  erythema or purulence. Sinuses are nontender. OP is clear TM's and EAC's normal. Neck: no lymphadenopathy or mass Heart: regular rate and rhythm  Lungs: mild diffuse ronchi posteriorly, minimal clearing with cough (unable to give a good cough) Back: no spinal or CVA tenderness Skin: Purpura on hands (limited skin exam). Extremities: no edema Psych: normal mood, affect, hygiene and grooming Neuro: alert, some mild memory issues.  Cranial nerves grossly intact, normal gait.   COVID negative   ASSESSMENT/PLAN:  Acute cough - suspect viral URI, bronchitis.  Reviewed supportive measures and reasons to return for re-eval - Plan: POC COVID-19  Chronic rhinitis - consider re-try ipratroprium nasal spray if ongoing cough related to PND/viral illness.   I recommend continuing with the robitussin products that you are taking. Return if you develop any fever, worsening shortness of breath, discolored mucus (from the nose) or phlegm (what you cough up). This could indicate an infection that might need antibiotics. Sometimes the runny nose can drain down the back of the throat, and contribute to coughing. Consider restarting one of your nasal sprays if your cough isn't improving (ipratroprium is the one I would restart, if needed). There is an antihistamine in the nighttime Robitussin that you are taking, and that might be helping at night.

## 2023-07-17 ENCOUNTER — Other Ambulatory Visit (HOSPITAL_COMMUNITY): Payer: Self-pay

## 2023-07-18 ENCOUNTER — Other Ambulatory Visit (HOSPITAL_COMMUNITY): Payer: Self-pay

## 2023-07-18 ENCOUNTER — Other Ambulatory Visit: Payer: Self-pay

## 2023-07-19 ENCOUNTER — Other Ambulatory Visit (HOSPITAL_COMMUNITY): Payer: Self-pay

## 2023-07-22 ENCOUNTER — Telehealth: Payer: Self-pay

## 2023-07-22 NOTE — Telephone Encounter (Signed)
 Wife answered the phone and asked "remote monitor still isn't working?".  I confirmed.  She was given [employer] phone number and she will reach out for a new monitor and call us  once received.

## 2023-07-22 NOTE — Telephone Encounter (Signed)
 No messages received for at least 21 days Last message received 23 days ago. The patient will be deactivated in 66 days

## 2023-07-23 ENCOUNTER — Other Ambulatory Visit (HOSPITAL_COMMUNITY): Payer: Self-pay

## 2023-07-23 ENCOUNTER — Other Ambulatory Visit: Payer: Self-pay

## 2023-07-23 ENCOUNTER — Ambulatory Visit: Admitting: Gastroenterology

## 2023-07-23 ENCOUNTER — Encounter: Payer: Self-pay | Admitting: Gastroenterology

## 2023-07-23 ENCOUNTER — Other Ambulatory Visit (INDEPENDENT_AMBULATORY_CARE_PROVIDER_SITE_OTHER)

## 2023-07-23 VITALS — BP 104/70 | HR 68 | Ht 72.0 in | Wt 156.0 lb

## 2023-07-23 DIAGNOSIS — R159 Full incontinence of feces: Secondary | ICD-10-CM | POA: Diagnosis not present

## 2023-07-23 DIAGNOSIS — R140 Abdominal distension (gaseous): Secondary | ICD-10-CM

## 2023-07-23 DIAGNOSIS — Z5986 Financial insecurity: Secondary | ICD-10-CM | POA: Diagnosis not present

## 2023-07-23 DIAGNOSIS — K580 Irritable bowel syndrome with diarrhea: Secondary | ICD-10-CM | POA: Diagnosis not present

## 2023-07-23 DIAGNOSIS — F0390 Unspecified dementia without behavioral disturbance: Secondary | ICD-10-CM | POA: Diagnosis not present

## 2023-07-23 DIAGNOSIS — R634 Abnormal weight loss: Secondary | ICD-10-CM

## 2023-07-23 DIAGNOSIS — R143 Flatulence: Secondary | ICD-10-CM | POA: Diagnosis not present

## 2023-07-23 LAB — IBC PANEL
Iron: 64 ug/dL (ref 42–165)
Saturation Ratios: 25.7 % (ref 20.0–50.0)
TIBC: 249.2 ug/dL — ABNORMAL LOW (ref 250.0–450.0)
Transferrin: 178 mg/dL — ABNORMAL LOW (ref 212.0–360.0)

## 2023-07-23 LAB — COMPREHENSIVE METABOLIC PANEL WITH GFR
ALT: 16 U/L (ref 0–53)
AST: 24 U/L (ref 0–37)
Albumin: 3.9 g/dL (ref 3.5–5.2)
Alkaline Phosphatase: 90 U/L (ref 39–117)
BUN: 20 mg/dL (ref 6–23)
CO2: 29 meq/L (ref 19–32)
Calcium: 9.1 mg/dL (ref 8.4–10.5)
Chloride: 102 meq/L (ref 96–112)
Creatinine, Ser: 1.16 mg/dL (ref 0.40–1.50)
GFR: 58.63 mL/min — ABNORMAL LOW (ref >60.00)
Glucose, Bld: 78 mg/dL (ref 70–99)
Potassium: 4.5 meq/L (ref 3.5–5.1)
Sodium: 139 meq/L (ref 135–145)
Total Bilirubin: 2.2 mg/dL — ABNORMAL HIGH (ref 0.2–1.2)
Total Protein: 6.6 g/dL (ref 6.0–8.3)

## 2023-07-23 LAB — VITAMIN B12: Vitamin B-12: 243 pg/mL (ref 211–911)

## 2023-07-23 LAB — VITAMIN D 25 HYDROXY (VIT D DEFICIENCY, FRACTURES): VITD: 18.37 ng/mL — ABNORMAL LOW (ref 30.00–100.00)

## 2023-07-23 MED ORDER — AMITRIPTYLINE HCL 25 MG PO TABS
50.0000 mg | ORAL_TABLET | Freq: Every day | ORAL | 3 refills | Status: DC
  Filled 2023-07-23 (×2): qty 180, 90d supply, fill #0

## 2023-07-23 NOTE — Progress Notes (Unsigned)
 HPI : Jason Day is a 83 y.o. 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 He was initially seen in by me in June 2024, and we had several long conversations over the phone since then discussing his symptoms and trying different medications.  Please see HPI from his initial visit in June for description of his history and symptoms.  He was last seen by me May 14, 2023, at which time his amitriptyline  was increased to 30 mg PO at bedtime.   Following his first visit, he was prescribed rifaximin , which which apparently did help transiently.  He was subsequently treated with colestipol , which helped initially, but then stopped working.  Viberzi was considered, but not felt to be an option because of his cholecystectomy status.  He was started on low-dose Elavil  in December 2024.   In January I had ordered an expanded evaluation to exclude other less likely causes of his symptoms and to assess for nutritional deficiencies, given his ongoing weight loss.  These tests have not been completed yet.  Last month, he was admitted for symptomatic bradycardia and underwent permanent pacemaker implantation. A CT of the abdomen and pelvis earlier this month was unremarkable.  No evidence of mass lesion, lymphadenopathy or bowel wall thickening.        Past Medical History:  Diagnosis Date   Acute combined systolic and diastolic congestive heart failure (HCC) 10/16/2016   Allergic rhinitis 09/01/2020   Anal fissure    Angio-edema    Bleeding hemorrhoids    Chronic combined systolic and diastolic heart failure (HCC)    Chronic sinus complaints    overuses afrin   Coronary atherosclerosis of native coronary artery    Dyspnea    Encounter for medical examination to establish care 04/22/2017   Gallstones    Hyperlipidemia    Irritable bowel  syndrome 06/07/2017   Ischemic cardiomyopathy    Left atrial enlargement    Long term (current) use of anticoagulants 12/30/2020   Moderate mitral regurgitation    Myocardial infarction (HCC) 09/2015   Persistent atrial fibrillation (HCC)    RBBB    A- Fib   Shortness of breath 12/29/2020   Urinary incontinence 12/30/2020     Past Surgical History:  Procedure Laterality Date   BACK SURGERY  ~2014   disc repair   CARDIOVERSION N/A 10/24/2018   Procedure: CARDIOVERSION;  Surgeon: Hugh Madura, MD;  Location: [facility] ENDOSCOPY;  Service: Cardiovascular;  Laterality: N/A;   CHOLECYSTECTOMY     COLONOSCOPY  04/06/2014   Hemorrhoids and diverticulosis performed in [place]    COLONOSCOPY WITH PROPOFOL  N/A 08/04/2019   Procedure: COLONOSCOPY WITH PROPOFOL ;  Surgeon: Celedonio Coil, MD;  Location: [facility] ENDOSCOPY;  Service: Endoscopy;  Laterality: N/A;   CORONARY ARTERY BYPASS GRAFT N/A 10/18/2016   Procedure: CORONARY ARTERY BYPASS GRAFTING (CABG) x five , using left internal mammary artery and right leg greater saphenous vein harvested endoscopically;  Surgeon: Bartley Lightning, MD;  Location: [facility] OR;  Service: Open Heart Surgery;  Laterality: N/A;   ESOPHAGOGASTRODUODENOSCOPY (EGD) WITH PROPOFOL  N/A 08/04/2019   Procedure: ESOPHAGOGASTRODUODENOSCOPY (EGD) WITH PROPOFOL ;  Surgeon: Celedonio Coil, MD;  Location: [facility] ENDOSCOPY;  Service: Endoscopy;  Laterality: N/A;   FOOT SURGERY     HEMORRHOID BANDING     HEMORRHOID SURGERY     PACEMAKER IMPLANT N/A 04/02/2023  Procedure: PACEMAKER IMPLANT;  Surgeon: Boyce Byes, MD;  Location: [facility] INVASIVE CV LAB;  Service: Cardiovascular;  Laterality: N/A;   REVERSE SHOULDER ARTHROPLASTY Left 08/21/2018   Procedure: REVERSE SHOULDER ARTHROPLASTY;  Surgeon: Ellard Gunning, MD;  Location: [facility] ORS;  Service: Orthopedics;  Laterality: Left;   RIGHT/LEFT HEART CATH AND CORONARY ANGIOGRAPHY N/A 10/16/2016   Procedure: RIGHT/LEFT HEART CATH AND CORONARY ANGIOGRAPHY;   Surgeon: Arty Binning, MD;  Location: [facility] INVASIVE CV LAB;  Service: Cardiovascular;  Laterality: N/A;   stents in liver     TEE WITHOUT CARDIOVERSION N/A 10/18/2016   Procedure: TRANSESOPHAGEAL ECHOCARDIOGRAM (TEE);  Surgeon: Bartley Lightning, MD;  Location: [facility] OR;  Service: Open Heart Surgery;  Laterality: N/A;   TRANSURETHRAL RESECTION OF PROSTATE N/A 11/19/2019   Procedure: CYSTOSCOPY TRANSURETHRAL RESECTION OF THE PROSTATE (TURP);  Surgeon: Homero Luster, MD;  Location: [facility] ORS;  Service: Urology;  Laterality: N/A;   Family History  Problem Relation Age of Onset   COPD Father    Emphysema Father    Healthy Brother    Diabetes Neg Hx    Cancer Neg Hx    Stomach cancer Neg Hx    Colon cancer Neg Hx    Allergic rhinitis Neg Hx    Angioedema Neg Hx    Asthma Neg Hx    Eczema Neg Hx    Urticaria Neg Hx    Social History   Tobacco Use   Smoking status: Former    Current packs/day: 0.00    Types: Cigarettes    Quit date: 1970    Years since quitting: 55.4    Passive exposure: Never   Smokeless tobacco: Never   Tobacco comments:    Quit in 1970  Vaping Use   Vaping status: Never Used  Substance Use Topics   Alcohol  use: No   Drug use: No   Current Outpatient Medications  Medication Sig Dispense Refill   amitriptyline  (ELAVIL ) 10 MG tablet Take 3 tablets (30 mg total) by mouth at bedtime. 90 tablet 1   atorvastatin  (LIPITOR) 10 MG tablet Take 1 tablet (10 mg total) by mouth daily. 90 tablet 1   azelastine  (ASTELIN ) 0.1 % nasal spray Place 2 sprays into both nostrils 2 (two) times daily. (Patient not taking: Reported on 07/16/2023) 30 mL 5   Azelastine -Fluticasone  137-50 MCG/ACT SUSP Place 1 spray into the nose 2 (two) times daily. (Patient not taking: Reported on 07/16/2023) 23 g 0   colestipol  (COLESTID ) 1 g tablet TAKE 1TAB BY MOUTH 2TIMES DAILY*TAKE THIS MEDICATION AT LEAST 1HR AFTER TAKING YOUR OTHER MEDS. (Patient not taking: Reported on 07/16/2023) 60 tablet 1   dapagliflozin   propanediol (FARXIGA ) 10 MG TABS tablet Take 1 tablet (10 mg total) by mouth daily before breakfast. 90 tablet 3   Dextromethorphan-guaiFENesin (ROBITUSSIN DM PO) Take 15 mLs by mouth as needed.     fluticasone  (FLONASE ) 50 MCG/ACT nasal spray Place 2 sprays into both nostrils daily. (Patient not taking: Reported on 07/16/2023) 16 g 2   ipratropium (ATROVENT ) 0.06 % nasal spray Place 2 sprays into both nostrils 2 (two) times daily as needed for rhinitis. (Patient not taking: Reported on 07/16/2023) 15 mL 5   megestrol  (MEGACE ) 20 MG tablet Take 1 tablet (20 mg total) by mouth 2 (two) times daily. (Patient not taking: Reported on 07/16/2023) 60 tablet 2   metoprolol  succinate (TOPROL -XL) 25 MG 24 hr tablet Take 0.5 tablets (12.5 mg total) by mouth daily. Take 12.5mg  daily 45 tablet  3   Probiotic Product (ALIGN PO) Take 1 capsule by mouth daily. (Patient not taking: Reported on 07/16/2023)     sacubitril -valsartan  (ENTRESTO ) 24-26 MG Take 1 tablet by mouth 2 (two) times daily. 180 tablet 3   spironolactone  (ALDACTONE ) 25 MG tablet Take 1 tablet (25 mg total) by mouth daily. 90 tablet 1   warfarin (COUMADIN ) 5 MG tablet Take 5 mg by mouth daily. Take 1 to 1.5 tablets tablet by mouth daily as directed by the coumadin  clinic     No current facility-administered medications for this visit.   No Known Allergies   Review of Systems: All systems reviewed and negative except where noted in HPI.    CUP PACEART INCLINIC DEVICE CHECK Result Date: 07/01/2023 Normal in-clinic single chamber pacemaker check. Presenting Rhythm: VP. Routine testing of thresholds, sensing, and impedance demonstrate stable parameters. Changed to chronic threshold settings. No episodes. Estimated longevity 10 years. Pt enrolled in remote follow-up.   Physical Exam: There were no vitals taken for this visit. Constitutional: Pleasant,well-developed, ***male in no acute distress. HEENT: Normocephalic and atraumatic. Conjunctivae  are normal. No scleral icterus. Neck supple.  Cardiovascular: Normal rate, regular rhythm.  Pulmonary/chest: Effort normal and breath sounds normal. No wheezing, rales or rhonchi. Abdominal: Soft, nondistended, nontender. Bowel sounds active throughout. There are no masses palpable. No hepatomegaly. Extremities: no edema Lymphadenopathy: No cervical adenopathy noted. Neurological: Alert and oriented to person place and time. Skin: Skin is warm and dry. No rashes noted. Psychiatric: Normal mood and affect. Behavior is normal.  CBC    Component Value Date/Time   WBC 6.1 04/01/2023 0801   RBC 4.53 04/01/2023 0801   HGB 14.1 04/01/2023 0801   HGB 14.7 10/12/2021 1027   HCT 41.5 04/01/2023 0801   HCT 43.9 10/12/2021 1027   PLT 138 (L) 04/01/2023 0801   PLT 110 (L) 10/12/2021 1027   MCV 91.6 04/01/2023 0801   MCV 93 10/12/2021 1027   MCH 31.1 04/01/2023 0801   MCHC 34.0 04/01/2023 0801   RDW 14.1 04/01/2023 0801   RDW 14.4 10/12/2021 1027   LYMPHSABS 1.2 03/30/2023 0222   LYMPHSABS 1.0 10/12/2021 1027   MONOABS 0.4 03/30/2023 0222   EOSABS 0.1 03/30/2023 0222   EOSABS 0.1 10/12/2021 1027   BASOSABS 0.0 03/30/2023 0222   BASOSABS 0.0 10/12/2021 1027    CMP     Component Value Date/Time   NA 140 04/09/2023 1147   K 4.2 04/09/2023 1147   CL 99 04/09/2023 1147   CO2 24 04/09/2023 1147   GLUCOSE 167 (H) 04/09/2023 1147   GLUCOSE 92 04/03/2023 0220   BUN 26 04/09/2023 1147   CREATININE 1.22 04/09/2023 1147   CALCIUM  9.0 04/09/2023 1147   PROT 6.3 (L) 03/30/2023 0222   PROT 6.9 10/12/2021 1027   ALBUMIN  3.6 03/30/2023 0222   ALBUMIN  4.5 10/12/2021 1027   AST 18 03/30/2023 0222   ALT 17 03/30/2023 0222   ALKPHOS 91 03/30/2023 0222   BILITOT 2.3 (H) 03/30/2023 0222   BILITOT 2.2 (H) 10/12/2021 1027   GFRNONAA 50 (L) 04/03/2023 0220   GFRAA >60 11/10/2019 1050       Latest Ref Rng & Units 04/01/2023    8:01 AM 03/30/2023    2:22 AM 03/29/2023    2:13 PM  CBC EXTENDED   WBC 4.0 - 10.5 K/uL 6.1  4.5    RBC 4.22 - 5.81 MIL/uL 4.53  4.16    Hemoglobin 13.0 - 17.0 g/dL 40.9  81.1  12.9   HCT 39.0 - 52.0 % 41.5  38.8  38.0   Platelets 150 - 400 K/uL 138  102    NEUT# 1.7 - 7.7 K/uL  2.7    Lymph# 0.7 - 4.0 K/uL  1.2        ASSESSMENT AND PLAN:  Claudene Crystal, PA-C

## 2023-07-23 NOTE — Telephone Encounter (Signed)
 Remote monitor connected 07/22/23 @ 1:32 pm.

## 2023-07-23 NOTE — Patient Instructions (Addendum)
 Your provider has requested that you go to the basement level for lab work before leaving today. Press "B" on the elevator. The lab is located at the first door on the left as you exit the elevator.  Due to recent changes in healthcare laws, you may see the results of your imaging and laboratory studies on MyChart before your provider has had a chance to review them.  We understand that in some cases there may be results that are confusing or concerning to you. Not all laboratory results come back in the same time frame and the provider may be waiting for multiple results in order to interpret others.  Please give us  48 hours in order for your provider to thoroughly review all the results before contacting the office for clarification of your results.    We have sent the following medications to your pharmacy for you to pick up at your convenience: Elavil  50mg  daily.  Please purchase the following medications over the counter and take as directed: Align probiotic, take 1 capsule daily.  Thank you for entrusting me with your care and for choosing [facility],  Dr. Darol Elizabeth   _______________________________________________________  If your blood pressure at your visit was 140/90 or greater, please contact your primary care physician to follow up on this.  _______________________________________________________  If you are age 83 or older, your body mass index should be between 23-30. Your Body mass index is 21.16 kg/m. If this is out of the aforementioned range listed, please consider follow up with your Primary Care Provider.  If you are age 83 or younger, your body mass index should be between 19-25. Your Body mass index is 21.16 kg/m. If this is out of the aformentioned range listed, please consider follow up with your Primary Care Provider.   ________________________________________________________  The [facility] providers would like to encourage you to use MYCHART to  communicate with providers for non-urgent requests or questions.  Due to long hold times on the telephone, sending your provider a message by [facility] may be a faster and more efficient way to get a response.  Please allow 48 business hours for a response.  Please remember that this is for non-urgent requests.  _______________________________________________________

## 2023-07-25 ENCOUNTER — Telehealth (HOSPITAL_COMMUNITY): Payer: Self-pay | Admitting: Cardiology

## 2023-07-27 LAB — ALPHA-GAL PANEL
Allergen, Mutton, f88: <0.1 kU/L
Allergen, Pork, f26: <0.1 kU/L
Beef: <0.1 kU/L
CLASS: 0
CLASS: 0
Class: 0
GALACTOSE-ALPHA-1,3-GALACTOSE IGE*: <0.1 kU/L (ref <0.10)

## 2023-07-27 LAB — INTERPRETATION:

## 2023-07-27 LAB — PREALBUMIN: Prealbumin: 13 mg/dL — ABNORMAL LOW (ref 21–43)

## 2023-07-31 ENCOUNTER — Ambulatory Visit: Payer: Self-pay | Admitting: Gastroenterology

## 2023-07-31 ENCOUNTER — Other Ambulatory Visit (HOSPITAL_COMMUNITY): Payer: Self-pay

## 2023-07-31 ENCOUNTER — Other Ambulatory Visit: Payer: Self-pay

## 2023-07-31 DIAGNOSIS — E559 Vitamin D deficiency, unspecified: Secondary | ICD-10-CM

## 2023-07-31 MED ORDER — VITAMIN D (ERGOCALCIFEROL) 1.25 MG (50000 UNIT) PO CAPS
50000.0000 [IU] | ORAL_CAPSULE | ORAL | 3 refills | Status: DC
  Filled 2023-07-31: qty 4, 28d supply, fill #0
  Filled 2023-08-13 – 2023-08-21 (×2): qty 4, 28d supply, fill #1

## 2023-07-31 NOTE — Progress Notes (Signed)
 Jason Day

## 2023-08-05 ENCOUNTER — Other Ambulatory Visit

## 2023-08-05 DIAGNOSIS — K580 Irritable bowel syndrome with diarrhea: Secondary | ICD-10-CM | POA: Diagnosis not present

## 2023-08-05 DIAGNOSIS — R159 Full incontinence of feces: Secondary | ICD-10-CM

## 2023-08-05 DIAGNOSIS — R143 Flatulence: Secondary | ICD-10-CM | POA: Diagnosis not present

## 2023-08-05 DIAGNOSIS — R140 Abdominal distension (gaseous): Secondary | ICD-10-CM

## 2023-08-06 LAB — GIARDIA ANTIGEN
MICRO NUMBER:: 16584536
RESULT:: NOT DETECTED
SPECIMEN QUALITY:: ADEQUATE

## 2023-08-07 LAB — CALPROTECTIN, FECAL: Calprotectin, Fecal: 219 ug/g — ABNORMAL HIGH (ref 0–120)

## 2023-08-09 ENCOUNTER — Other Ambulatory Visit: Payer: Self-pay

## 2023-08-09 ENCOUNTER — Telehealth: Payer: Self-pay

## 2023-08-09 ENCOUNTER — Ambulatory Visit

## 2023-08-09 ENCOUNTER — Other Ambulatory Visit (HOSPITAL_COMMUNITY): Payer: Self-pay

## 2023-08-09 ENCOUNTER — Telehealth: Payer: Self-pay | Admitting: Cardiovascular Disease

## 2023-08-09 NOTE — Telephone Encounter (Signed)
Lpm to reschedule INR appt 

## 2023-08-09 NOTE — Telephone Encounter (Signed)
 Pt requesting cb to discuss getting a.m. work in appt on 7/9 the same day he see's Jason Day due to him missing appt this morning saying its not his fault. There seemed to be a lot of confusion regarding appt's.

## 2023-08-09 NOTE — Telephone Encounter (Signed)
 Lpmtcb to reschedule INR appt for 7/9 since he has Dr Stann Earnest appt 10:30.

## 2023-08-10 ENCOUNTER — Other Ambulatory Visit (HOSPITAL_COMMUNITY): Payer: Self-pay

## 2023-08-10 ENCOUNTER — Other Ambulatory Visit: Payer: Self-pay | Admitting: Cardiovascular Disease

## 2023-08-10 DIAGNOSIS — I4821 Permanent atrial fibrillation: Secondary | ICD-10-CM

## 2023-08-12 ENCOUNTER — Other Ambulatory Visit (HOSPITAL_COMMUNITY): Payer: Self-pay

## 2023-08-12 MED ORDER — WARFARIN SODIUM 5 MG PO TABS
5.0000 mg | ORAL_TABLET | Freq: Every day | ORAL | 0 refills | Status: DC
Start: 2023-08-12 — End: 2023-10-21
  Filled 2023-08-12: qty 100, 66d supply, fill #0

## 2023-08-13 ENCOUNTER — Other Ambulatory Visit (HOSPITAL_COMMUNITY): Payer: Self-pay

## 2023-08-13 LAB — PANCREATIC ELASTASE, FECAL: Pancreatic Elastase-1, Stool: 618 ug/g (ref >200)

## 2023-08-14 ENCOUNTER — Ambulatory Visit (INDEPENDENT_AMBULATORY_CARE_PROVIDER_SITE_OTHER)

## 2023-08-14 DIAGNOSIS — I255 Ischemic cardiomyopathy: Secondary | ICD-10-CM

## 2023-08-14 LAB — CUP PACEART REMOTE DEVICE CHECK
Battery Voltage: 100
Date Time Interrogation Session: 20250625081349
Implantable Lead Connection Status: 753985
Implantable Lead Implant Date: 20250211
Implantable Lead Location: 753862
Implantable Lead Model: 377
Implantable Lead Serial Number: 8001791902
Implantable Pulse Generator Implant Date: 20250211
Pulse Gen Serial Number: 1000357520

## 2023-08-21 NOTE — Progress Notes (Signed)
 Cardiology Office Note    Date:  08/28/2023   ID:  Jason Day, DOB 24-Jul-1940, MRN [id number]  PCP:  [name], PA-C  Cardiologist:  Dr. Delford   Chief Complaint: Edema / CHF   History of Present Illness:   Jason Day is a 83 y.o. male CAD s/p CABG, ICM, PAF,  MR and HLD and PPM-  seen in f/u   He initially presented 09/2016 w/ acute pulmonary edema and diuresed. Echo showed reduced LVEF, down to 30%, leading to LHC, which showed severe multivessel CAD. 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 Post of afib >> on amiodarone  and coumadin . He is claustrophobic and cannot have MRI for EF calculation  He has had recurrent afib and failed medical Rx with amiodarone  LA 51 mm diameter on TTE Seen by [facility] 12/25/18 and ablation deferred plan for rate control and anticoagulation strategy Xarelto  too expensive on coumadin  now   Last echo  03/08/22 EF 25-30% severe RV reduction severe LAE moderate MR and AV sclerosis   Currently his edema is from LE venous disease and not CHF  Had benign upper endoscopy and colonoscopy 08/04/19 Dr Lennard  Also had some urinary retention requiring foley and cystoscopy  Wife notes some behavioral issues and very argumentative   Called office 12/2020 and complained of increasing dyspnea PND and orthopnea has been taking his aldactone  and lasix   His baseline ECG shows afib with LAD and RBBB Seen by Dr Cindie  11/28/22 and did not think he would benefit from CRT pacing given fairly narrow RBBB Patient declined AICD despite indications for it.   Chronic imbalance and dizziness due to vestibular issues.   In hospital 03/2023 with symptomatic bradycardia despite holding beta blocker Had Biotronik single chamber PPM placed by Dr Cindie   Memory is poor and has had a lot of weight loss and muscle wasting Megace  not helpful    Past Medical History:  Diagnosis Date   Acute combined systolic and diastolic congestive heart failure (HCC) 10/16/2016   Allergic rhinitis 09/01/2020    Anal fissure    Angio-edema    Bleeding hemorrhoids    Chronic combined systolic and diastolic heart failure (HCC)    Chronic sinus complaints    overuses afrin   Coronary atherosclerosis of native coronary artery    Dyspnea    Encounter for medical examination to establish care 04/22/2017   Gallstones    Hyperlipidemia    Irritable bowel syndrome 06/07/2017   Ischemic cardiomyopathy    Left atrial enlargement    Long term (current) use of anticoagulants 12/30/2020   Moderate mitral regurgitation    Myocardial infarction (HCC) 09/2015   Persistent atrial fibrillation (HCC)    RBBB    A- Fib   Shortness of breath 12/29/2020   Urinary incontinence 12/30/2020    Past Surgical History:  Procedure Laterality Date   BACK SURGERY  ~2014   disc repair   CARDIOVERSION N/A 10/24/2018   Procedure: CARDIOVERSION;  Surgeon: Jeffrie Oneil BROCKS, MD;  Location: [facility];  Service: Cardiovascular;  Laterality: N/A;   CHOLECYSTECTOMY     COLONOSCOPY  04/06/2014   Hemorrhoids and diverticulosis performed in [place]    COLONOSCOPY WITH PROPOFOL  N/A 08/04/2019   Procedure: COLONOSCOPY WITH PROPOFOL ;  Surgeon: Lennard Lesta FALCON, MD;  Location: [facility];  Service: Endoscopy;  Laterality: N/A;   CORONARY ARTERY BYPASS GRAFT N/A 10/18/2016   Procedure: CORONARY ARTERY BYPASS GRAFTING (CABG) x five , using left internal mammary artery and right leg greater  saphenous vein harvested endoscopically;  Surgeon: Lucas Dorise POUR, MD;  Location: [facility] OR;  Service: Open Heart Surgery;  Laterality: N/A;   ESOPHAGOGASTRODUODENOSCOPY (EGD) WITH PROPOFOL  N/A 08/04/2019   Procedure: ESOPHAGOGASTRODUODENOSCOPY (EGD) WITH PROPOFOL ;  Surgeon: Lennard Lesta FALCON, MD;  Location: [facility];  Service: Endoscopy;  Laterality: N/A;   FOOT SURGERY     HEMORRHOID BANDING     HEMORRHOID SURGERY     PACEMAKER IMPLANT N/A 04/02/2023   Procedure: PACEMAKER IMPLANT;  Surgeon: Cindie Ole DASEN, MD;  Location: [facility] INVASIVE CV LAB;   Service: Cardiovascular;  Laterality: N/A;   REVERSE SHOULDER ARTHROPLASTY Left 08/21/2018   Procedure: REVERSE SHOULDER ARTHROPLASTY;  Surgeon: Melita Drivers, MD;  Location: [facility] ORS;  Service: Orthopedics;  Laterality: Left;   RIGHT/LEFT HEART CATH AND CORONARY ANGIOGRAPHY N/A 10/16/2016   Procedure: RIGHT/LEFT HEART CATH AND CORONARY ANGIOGRAPHY;  Surgeon: Claudene Victory ORN, MD;  Location: [facility] INVASIVE CV LAB;  Service: Cardiovascular;  Laterality: N/A;   stents in liver     TEE WITHOUT CARDIOVERSION N/A 10/18/2016   Procedure: TRANSESOPHAGEAL ECHOCARDIOGRAM (TEE);  Surgeon: Lucas Dorise POUR, MD;  Location: [facility] OR;  Service: Open Heart Surgery;  Laterality: N/A;   TRANSURETHRAL RESECTION OF PROSTATE N/A 11/19/2019   Procedure: CYSTOSCOPY TRANSURETHRAL RESECTION OF THE PROSTATE (TURP);  Surgeon: Watt Rush, MD;  Location: [facility] ORS;  Service: Urology;  Laterality: N/A;    Current Medications:  Prior to Admission medications   Medication Sig Start Date End Date Taking? Authorizing Provider  atorvastatin  (LIPITOR) 20 MG tablet Take 0.5 tablets (10 mg total) by mouth daily. Patient taking differently: Take 10 mg by mouth every evening.  07/29/18  Yes Armondo Cech C, MD  digoxin  (LANOXIN ) 0.125 MG tablet Take 0.5 tablets (62.5 mcg total) by mouth daily. 06/26/18  Yes Delford Maude BROCKS, MD  metoprolol  succinate (TOPROL -XL) 25 MG 24 hr tablet Take 0.5 tablets (12.5 mg total) by mouth daily. Patient taking differently: Take 12.5 mg by mouth at bedtime.  06/26/18  Yes Delford Maude BROCKS, MD  sacubitril -valsartan  (ENTRESTO ) 49-51 MG Take 1 tablet by mouth 2 (two) times daily. 07/29/18  Yes Delford Maude BROCKS, MD  spironolactone  (ALDACTONE ) 25 MG tablet Take 0.5 tablets (12.5 mg total) by mouth daily. Patient taking differently: Take 12.5 mg by mouth every evening.  06/26/18  Yes Novi Calia C, MD  warfarin (COUMADIN ) 5 MG tablet Take as directed by Coumadin  Clinic Patient taking differently: Take 5 mg by mouth every evening.  Take as directed by Coumadin  Clinic 06/26/18  Yes Delford Maude BROCKS, MD   Allergies:   Patient has no known allergies.   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  Tobacco Use   Smoking status: Former    Current packs/day: 0.00    Types: Cigarettes    Quit date: 1970    Years since quitting: 55.5    Passive exposure: Never   Smokeless tobacco: Never   Tobacco comments:    Quit in 1970  Vaping Use   Vaping status: Never Used  Substance and Sexual Activity   Alcohol  use: No   Drug use: No   Sexual activity: Yes  Other Topics Concern   Not on file  Social History Narrative  Married    Originally from [place]  moved to [place]  age 63 moved to [facility] 2018   1 son Greycen Felter in [place]   1dtr [place]       Has living will   Wife is health care POA--then son   Would accept resuscitation   No tube feeds if cognitively unaware   Social Drivers of Health   Financial Resource Strain: Low Risk  (12/04/2022)   Overall Financial Resource Strain (CARDIA)    Difficulty of Paying Living Expenses: Not hard at all  Food Insecurity: No Food Insecurity (04/05/2023)   Hunger Vital Sign    Worried About Running Out of Food in the Last Year: Never true    Ran Out of Food in the Last Year: Never true  Transportation Needs: No Transportation Needs (04/05/2023)   PRAPARE - Administrator, Civil Service (Medical): No    Lack of Transportation (Non-Medical): No  Physical Activity: Insufficiently Active (12/04/2022)   Exercise Vital Sign    Days of Exercise per Week: 3 days    Minutes of Exercise per Session: 30 min  Stress: No Stress Concern Present (12/04/2022)   [employer] of Occupational Health - Occupational Stress  Questionnaire    Feeling of Stress : Not at all  Social Connections: Moderately Isolated (03/30/2023)   Social Connection and Isolation Panel    Frequency of Communication with Friends and Family: Never    Frequency of Social Gatherings with Friends and Family: Twice a week    Attends Religious Services: More than 4 times per year    Active Member of [employer] or Organizations: No    Attends Banker Meetings: Never    Marital Status: Married     Family History:  The patient's family history includes COPD in his father; Emphysema in his father; Healthy in his brother.   ROS:   Please see the history of present illness.    ROS All other systems reviewed and are negative.   PHYSICAL EXAM:   VS:  BP 92/64   Pulse 84   Ht 6' (1.829 m)   Wt 143 lb 6.4 oz (65 kg)   SpO2 98%   BMI 19.45 kg/m     Argumentative  Chronically ill male HEENT: normal Neck supple with no adenopathy JVP normal no bruits no thyromegaly Lungs clear with no wheezing and good diaphragmatic motion Heart:  S1/S2 apical MR murmur, no rub, gallop or click PMI  Enlarged  post sternotomy PPM under left clavicle  Abdomen: benighn, BS positve, no tenderness, no AAA no bruit.  No HSM or HJR Foley catheter strapped to right leg  Distal pulses intact with no bruits Plus one edema bad varicosities bilaterally chronic stasis  Neuro non-focal Plus one edema with stasis and venous dx   Wt Readings from Last 3 Encounters:  08/28/23 143 lb 6.4 oz (65 kg)  07/23/23 156 lb (70.8 kg)  07/16/23 153 lb 12.8 oz (69.8 kg)      Studies/Labs Reviewed:   EKG:  9/10/21afib RBBB LAD old IMI rate 66   Recent Labs: 03/30/2023: B Natriuretic Peptide 1,485.4 04/01/2023: Hemoglobin 14.1; Magnesium  2.0; Platelets 138 07/23/2023: ALT 16; BUN 20; Creatinine, Ser 1.16; Potassium 4.5; Sodium 139   Lipid Panel    Component Value Date/Time   CHOL 103 10/12/2021 1027   TRIG 61 10/12/2021 1027   HDL 44 10/12/2021 1027   CHOLHDL  2.3 10/12/2021 1027   CHOLHDL 6 04/22/2017  1431   VLDL 9 10/15/2016 1155   LDLCALC 45 10/12/2021 1027   LDLDIRECT 119.0 04/22/2017 1431    Additional studies/ records that were reviewed today include:   Echo  03/08/22   IMPRESSIONS     1. Left ventricular ejection fraction, by estimation, is 25 to 30%. Left  ventricular ejection fraction by PLAX is 26 %. The left ventricle has  severely decreased function. The left ventricle demonstrates global  hypokinesis. The left ventricular internal   cavity size was moderately dilated. There is moderate asymmetric left  ventricular hypertrophy of the basal-septal segment. Indeterminate  diastolic filling due to E-A fusion.   2. Right ventricular systolic function is severely reduced. The right  ventricular size is mildly enlarged. There is normal pulmonary artery  systolic pressure. The estimated right ventricular systolic pressure is  33.6 mmHg.   3. Left atrial size was severely dilated.   4. Right atrial size was mildly dilated.   5. The mitral valve is degenerative. Moderate mitral valve regurgitation.   6. Significant thickening of the non-coronary cusp. The aortic valve is  tricuspid. There is moderate thickening of the aortic valve. Aortic valve  regurgitation is not visualized. No aortic stenosis is present. Aortic  valve mean gradient measures 4.0  mmHg.   7. The pulmonic valve was abnormal.   8. Aortic dilatation noted. There is borderline dilatation of the  ascending aorta, measuring 38 mm.   9. The inferior vena cava is normal in size with <50% respiratory  variability, suggesting right atrial pressure of 8 mmHg.   Comparison(s): Changes from prior study are noted. 10/05/2020: LVEF 30-35%,  global HK, grade 2DD.   Echo 03/30/23 1. Left ventricular ejection fraction, by estimation, is 45 to 50%. The  left ventricle has mildly decreased function. The left ventricle  demonstrates regional wall motion abnormalities (see scoring   diagram/findings for description). Basal inferior  aneurysmal segment also present on prior study. The left ventricular  internal cavity size was mildly dilated. There is moderate asymmetric left  ventricular hypertrophy of the basal segment. Left ventricular diastolic  parameters are indeterminate.   2. Right ventricular systolic function is low normal. The right  ventricular size is normal. There is normal pulmonary artery systolic  pressure. The estimated right ventricular systolic pressure is 35.3 mmHg.   3. Left atrial size was severely dilated.   4. Right atrial size was moderately dilated.   5. The mitral valve is degenerative. Moderate mitral valve regurgitation.   6. Tricuspid valve regurgitation is mild to moderate.   7. The aortic valve is tricuspid. There is moderate calcification of the  aortic valve. Aortic valve regurgitation is not visualized.   8. Aortic dilatation noted. There is mild dilatation of the ascending  aorta, measuring 39 mm.   9. The inferior vena cava is normal in size with greater than 50%  respiratory variability, suggesting right atrial pressure of 3 mmHg     ASSESSMENT & PLAN:    1. Acute on chronic combined CHF  - LVEF improved to 45-50% by TTE 03/30/23  - GDMT with entresto , toprol  and aldactone   - Euvolemic   2. CAD s/p CABG -Patient denies any anginal symptoms.  Should be on 81 mg ASA   3 Chronic Afib  - on coumadin  , Xarelto  too expensive Failed cardioversion and medical Rx with amiodarone  Seen by Dr Kelsie 12/25/18 and ablation deferred adopted strategy of rate control and anticoagulation LA dimension by echo 51 mm INR Rx  2.8 on 11/29/22  5.  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 Edema:  Discussed seeing vein doctors at [facility] Compression stockings and diuretic with elevation  of legs   7. Urology:  F/u post cystoscopy / foley out   8. Behavioral:  needs to f/u with primary as he has dementia with behavioral issues that are getting worse. Discussed taking to primary about appetite stimulant as well Suggested Boost supplement   9. Bradycardia:  now post PPM single lead Biotronic f/u Cindie normal function by [facility] 08/14/23 CRT deferred due to narrow RBBB and no AICD due to advanced age per EP had afib with CHB  Medication Adjustments/Labs and Tests Ordered:  None   F/U in 6 months Lamber and a year with me   Signed, Maude Emmer, MD  08/28/2023 10:37 AM    [facility] [address] [place], [place], [place]  [address] Phone: [phone]; Fax: [phone]

## 2023-08-27 ENCOUNTER — Other Ambulatory Visit (HOSPITAL_COMMUNITY): Payer: Self-pay

## 2023-08-27 ENCOUNTER — Telehealth: Payer: Self-pay

## 2023-08-27 NOTE — Telephone Encounter (Signed)
 Patient Advocate Encounter   DUE FOR RENEWAL   The patient was approved for a [employer] grant that will help cover the cost of ENTRESTO /FARXIGA  Total amount awarded, $4,500.  Effective: 09/11/23 - 09/09/24   APW:[id number] ERW:[id number] Hmnle:[id number] PI:[id number]   Pharmacy provided with approval and processing information.

## 2023-08-28 ENCOUNTER — Ambulatory Visit (INDEPENDENT_AMBULATORY_CARE_PROVIDER_SITE_OTHER)

## 2023-08-28 ENCOUNTER — Encounter: Payer: Self-pay | Admitting: Cardiovascular Disease

## 2023-08-28 ENCOUNTER — Ambulatory Visit: Attending: Cardiovascular Disease | Admitting: Cardiovascular Disease

## 2023-08-28 VITALS — BP 92/64 | HR 84 | Ht 72.0 in | Wt 143.4 lb

## 2023-08-28 DIAGNOSIS — Z951 Presence of aortocoronary bypass graft: Secondary | ICD-10-CM | POA: Diagnosis not present

## 2023-08-28 DIAGNOSIS — I4821 Permanent atrial fibrillation: Secondary | ICD-10-CM | POA: Diagnosis not present

## 2023-08-28 DIAGNOSIS — Z950 Presence of cardiac pacemaker: Secondary | ICD-10-CM

## 2023-08-28 DIAGNOSIS — I420 Dilated cardiomyopathy: Secondary | ICD-10-CM | POA: Diagnosis not present

## 2023-08-28 LAB — POCT INR: INR: 2.8 (ref 2.0–3.0)

## 2023-08-28 NOTE — Patient Instructions (Signed)
 Medication Instructions:  Your physician recommends that you continue on your current medications as directed. Please refer to the Current Medication list given to you today.  *If you need a refill on your cardiac medications before your next appointment, please call your pharmacy*  Lab Work: If you have labs (blood work) drawn today and your tests are completely normal, you will receive your results only by: MyChart Message (if you have MyChart) OR A paper copy in the mail If you have any lab test that is abnormal or we need to change your treatment, we will call you to review the results.  Follow-Up: At [facility], you and your health needs are our priority.  As part of our continuing mission to provide you with exceptional heart care, our providers are all part of one team.  This team includes your primary Cardiologist (physician) and Advanced Practice Providers or APPs (Physician Assistants and Nurse Practitioners) who all work together to provide you with the care you need, when you need it.  Your next appointment:   12 month(s)  Provider:   Maude Emmer, MD    We recommend signing up for the patient portal called MyChart.  Sign up information is provided on this After Visit Summary.  MyChart is used to connect with patients for Virtual Visits (Telemedicine).  Patients are able to view lab/test results, encounter notes, upcoming appointments, etc.  Non-urgent messages can be sent to your provider as well.   To learn more about what you can do with MyChart, go to [email or web address].   Other Instructions Your physician recommends that you schedule a follow-up appointment in: 6 months with Dr. Cindie

## 2023-08-28 NOTE — Progress Notes (Signed)
Please see anticoagulation encounter.

## 2023-08-28 NOTE — Patient Instructions (Signed)
 Description   Continue taking Warfarin 1 tablet daily.  Recheck INR in 6 weeks  Call coumadin  clinic for any changes in medications or upcoming procedures.  Coumadin  Clinic [phone]

## 2023-09-02 ENCOUNTER — Telehealth (HOSPITAL_COMMUNITY): Payer: Self-pay | Admitting: Cardiology

## 2023-09-02 NOTE — Telephone Encounter (Signed)
 Called to confirm/remind patient of their appointment at [facility] on 09/02/2023.   Appointment:   [x] Confirmed  [] Left mess   [] No answer/No voice mail  [] VM Full/unable to leave message  [] Phone not in service  Patient reminded to bring all medications and/or complete list.  Confirmed patient has transportation. Gave directions, instructed to utilize valet parking.

## 2023-09-03 ENCOUNTER — Ambulatory Visit (HOSPITAL_COMMUNITY)
Admission: RE | Admit: 2023-09-03 | Discharge: 2023-09-03 | Disposition: A | Discharge status: Home / Self Care | Source: Ambulatory Visit | Attending: Cardiology | Admitting: Cardiology

## 2023-09-03 ENCOUNTER — Encounter (HOSPITAL_COMMUNITY): Payer: Self-pay | Admitting: Cardiology

## 2023-09-03 ENCOUNTER — Ambulatory Visit (HOSPITAL_COMMUNITY): Payer: Self-pay | Admitting: Cardiology

## 2023-09-03 VITALS — BP 95/50 | HR 78

## 2023-09-03 DIAGNOSIS — I4729 Other ventricular tachycardia: Secondary | ICD-10-CM | POA: Insufficient documentation

## 2023-09-03 DIAGNOSIS — Z950 Presence of cardiac pacemaker: Secondary | ICD-10-CM | POA: Insufficient documentation

## 2023-09-03 DIAGNOSIS — I340 Nonrheumatic mitral (valve) insufficiency: Secondary | ICD-10-CM | POA: Insufficient documentation

## 2023-09-03 DIAGNOSIS — Z951 Presence of aortocoronary bypass graft: Secondary | ICD-10-CM | POA: Diagnosis not present

## 2023-09-03 DIAGNOSIS — I5022 Chronic systolic (congestive) heart failure: Secondary | ICD-10-CM | POA: Diagnosis not present

## 2023-09-03 DIAGNOSIS — Z79899 Other long term (current) drug therapy: Secondary | ICD-10-CM | POA: Diagnosis not present

## 2023-09-03 DIAGNOSIS — I5042 Chronic combined systolic (congestive) and diastolic (congestive) heart failure: Secondary | ICD-10-CM

## 2023-09-03 DIAGNOSIS — Z7901 Long term (current) use of anticoagulants: Secondary | ICD-10-CM | POA: Diagnosis not present

## 2023-09-03 DIAGNOSIS — I4821 Permanent atrial fibrillation: Secondary | ICD-10-CM | POA: Insufficient documentation

## 2023-09-03 DIAGNOSIS — I2510 Atherosclerotic heart disease of native coronary artery without angina pectoris: Secondary | ICD-10-CM | POA: Insufficient documentation

## 2023-09-03 DIAGNOSIS — R001 Bradycardia, unspecified: Secondary | ICD-10-CM | POA: Diagnosis not present

## 2023-09-03 DIAGNOSIS — I255 Ischemic cardiomyopathy: Secondary | ICD-10-CM | POA: Insufficient documentation

## 2023-09-03 LAB — BRAIN NATRIURETIC PEPTIDE: B Natriuretic Peptide: 758.3 pg/mL — ABNORMAL HIGH (ref 0.0–100.0)

## 2023-09-03 LAB — BASIC METABOLIC PANEL WITH GFR
Anion gap: 10 (ref 5–15)
BUN: 35 mg/dL — ABNORMAL HIGH (ref 8–23)
CO2: 28 mmol/L (ref 22–32)
Calcium: 9.2 mg/dL (ref 8.9–10.3)
Chloride: 101 mmol/L (ref 98–111)
Creatinine, Ser: 1.74 mg/dL — ABNORMAL HIGH (ref 0.61–1.24)
GFR, Estimated: 39 mL/min — ABNORMAL LOW (ref >60)
Glucose, Bld: 97 mg/dL (ref 70–99)
Potassium: 4.6 mmol/L (ref 3.5–5.1)
Sodium: 139 mmol/L (ref 135–145)

## 2023-09-03 LAB — LIPID PANEL
Cholesterol: 94 mg/dL (ref 0–200)
HDL: 44 mg/dL (ref >40)
LDL Cholesterol: 44 mg/dL (ref 0–99)
Total CHOL/HDL Ratio: 2.1 ratio
Triglycerides: 31 mg/dL (ref <150)
VLDL: 6 mg/dL (ref 0–40)

## 2023-09-03 NOTE — Progress Notes (Signed)
 PCP: [name], PA-C Cardiology: Dr. Delford HF Cardiology: Dr. Rolan  83 y.o. with history of CAD s/p CABG, ischemic cardiomyopathy, and permanent atrial fibrillation referred by Dr. Delford for evaluation of CHF.  Patient had CABG x 5 in 8/18.  Post-op echo showed EF 35%. Since that time, LV EF has ranged from 25-35%.  Most recent echo in 1/24 showed EF 25-30%, moderate LV dilation, severely decreased RV systolic function, severe LAE, moderate MR. He had post-op atrial fibrillation.  He failed amiodarone  and cardioversion, and it was decided not to pursue AF ablation.  He is in permanent atrial fibrillation.  In 12/23, he had a presyncopal episode.  Zio monitor was placed showing AF rate ranging 29 - 100 bpm, 46 pauses with the longest 4 seconds (at night). Also 28 NSVT runs, longest 18 beats.   Seen for initial HF visit in 02/24. Volume stable. Started on Farxiga .   He was admitted in 2/25 with bradycardia, suspected atrial fibrillation with complete heart block.  Biotronik PPM with left bundle lead was placed (he had not wanted ICD in past and now of advanced age). Echo in 2/25 showed EF 45-50%, basal inferior aneurysm, RV low normal function, severe LAE, IVC normal, moderate MR (possibly infarct-related).   He is here today for follow-up. He has been doing fairly well.  Dyspnea noted walking up stairs.  However, he can do yardwork and walk on flat ground without problems.  No orthopnea/PND.  No lightheadedness unless he bends over for a long time then stands up.  No chest pain.    Labs (1/24): K 4.6, creatinine 1.15 Labs (6/25): K 4.5, creatinine 1.16  ECG (personally reviewed): Atrial fibrillation with V-pacing  PMH:  1. CAD: S/p CABG 8/18 with LIMA-LAD, SVG-D2, SVG-OM1, seq SVG-OM2 and OM3.  2. Atrial fibrillation: Post-op CABG initially.  Failed amiodarone  and cardioversion. Decided against ablation in 2020. Now permanent.  3. Chronic systolic CHF: Ischemic cardiomyopathy.  - Echo  (1/19): EF 35% - Echo (2020): EF 30-35% - Echo (8/22): EF 30-35%, moderate-severe MR - Echo (1/24): EF 25-30%, moderate LV dilation, severely decreased RV systolic function, severe LAE, moderate MR.  - Echo (2/25): EF 45-50%, basal inferior aneurysm, RV low normal function, severe LAE, IVC normal, moderate MR (possibly infarct-related).  4. 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  Moderate-severe on 8/22 echo but moderate on 1/24 echo.  5. Bradycardia: Presyncopal episode in 12/23.  Zio monitor (1/24) with AF rate ranging 29 - 100 bpm, 46 pauses with the longest 4 seconds (at night).  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  6. NSVT: 28 NSVT runs on 1/24 Zio monitor, longest 18 beats.  7. Mild dementia 8. 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  Married.  Quit smoking remotely.  Rare ETOH.   Family History  Problem Relation Age of Onset   COPD Father    Emphysema Father    Healthy Brother    Diabetes Neg Hx    Cancer Neg Hx    Stomach cancer Neg Hx    Colon cancer Neg Hx    Allergic rhinitis Neg Hx    Angioedema Neg Hx    Asthma Neg Hx    Eczema Neg Hx    Urticaria Neg Hx    ROS: All systems reviewed and negative except as per HPI.   Current Outpatient Medications  Medication Sig Dispense Refill   amitriptyline  (ELAVIL ) 25 MG tablet Take 2 tablets (  50 mg total) by mouth at bedtime. 180 tablet 3   atorvastatin  (LIPITOR) 10 MG tablet Take 1 tablet (10 mg total) by mouth daily. 90 tablet 1   Azelastine -Fluticasone  137-50 MCG/ACT SUSP Place 1 spray into the nose 2 (two) times daily. 23 g 0   colestipol  (COLESTID ) 1 g tablet TAKE 1TAB BY MOUTH 2TIMES DAILY*TAKE THIS MEDICATION AT LEAST 1HR AFTER TAKING YOUR OTHER MEDS. 60 tablet 1   dapagliflozin  propanediol (FARXIGA ) 10 MG TABS tablet Take 1 tablet (10 mg total) by mouth daily before breakfast. 90 tablet 3   Dextromethorphan-guaiFENesin (ROBITUSSIN DM PO) Take 15 mLs by  mouth as needed.     fluticasone  (FLONASE ) 50 MCG/ACT nasal spray Place 2 sprays into both nostrils daily. 16 g 2   megestrol  (MEGACE ) 20 MG tablet Take 1 tablet (20 mg total) by mouth 2 (two) times daily. 60 tablet 2   metoprolol  succinate (TOPROL -XL) 25 MG 24 hr tablet Take 0.5 tablets (12.5 mg total) by mouth daily. Take 12.5mg  daily 45 tablet 3   Probiotic Product (ALIGN PO) Take 1 capsule by mouth daily.     sacubitril -valsartan  (ENTRESTO ) 49-51 MG Take 1 tablet by mouth 2 (two) times daily.     spironolactone  (ALDACTONE ) 25 MG tablet Take 1 tablet (25 mg total) by mouth daily. 90 tablet 1   Vitamin D , Ergocalciferol , (DRISDOL ) 1.25 MG (50000 UNIT) CAPS capsule Take 1 capsule (50,000 Units total) by mouth every 7 (seven) days. 4 capsule 3   warfarin (COUMADIN ) 5 MG tablet Take 5 mg by mouth daily. Take 1 to 1.5 tablets tablet by mouth daily as directed by the coumadin  clinic     warfarin (COUMADIN ) 5 MG tablet Take 1-1.5 tablets (5-7.5 mg total) by mouth daily as directed by coumadin  clinic. 100 tablet 0   No current facility-administered medications for this encounter.   BP (!) 95/50   Pulse 78   SpO2 96%  General: NAD Neck: No JVD, no thyromegaly or thyroid  nodule.  Lungs: Clear to auscultation bilaterally with normal respiratory effort. CV: Nondisplaced PMI.  Heart regular S1/S2, no S3/S4, no murmur.  No peripheral edema.  No carotid bruit.  Normal pedal pulses.  Abdomen: Soft, nontender, no hepatosplenomegaly, no distention.  Skin: Intact without lesions or rashes.  Neurologic: Alert and oriented x 3.  Psych: Normal affect. Extremities: No clubbing or cyanosis.  HEENT: Normal.   Assessment/Plan: 1. Chronic systolic CHF: Ischemic cardiomyopathy.  He has a Biotronik PPM with left bundle lead.  He refused ICD in the past.   Echoes dating from 2018 have shown EF ranging 25-35%.  Echo in 1/24 showed EF 25-30%, moderate LV dilation, severely decreased RV systolic function, severe LAE,  moderate MR. However, most recent echo in 2/25 showed EF up to 45-50%, basal inferior aneurysm, RV low normal function, severe LAE, IVC normal, moderate MR (possibly infarct-related).  NYHA class II symptoms, not volume overloaded on exam.  BP low today but he denies orthostatic symptoms.  - Continue Entresto  49/51 bid for now, but will check BMET today.  I will cut back to lower dose Entresto  if creatinine is elevated (given low BP).   - Continue Toprol  XL 25 daily.  - Continue spironolactone  25 mg daily.  - Continue Farxiga  10 mg daily.   - He does not appear to need Lasix .  2. NSVT: 28 NSVT runs on 1/24 Zio monitor, longest 18 beats.  - Continue Toprol  XL.  3. Atrial fibrillation: Permanent.  Failed amiodarone  and DCCV,  and ablation was decided against.  No GI bleeding.   - Continue warfarin, DOACs not well-covered by his insurance.  4. Bradycardia: He had complete heart block in setting of AF in 2/25.  Now s/p placement of Biotronik PPM with left bundle lead.  5. 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  Most recent echo in 2/25 showed moderate MR. Follow for now.  6. CAD: CABG x 5 in 2018.  No chest pain.  - No ASA given stable CAD and warfarin use.  - Continue atorvastatin , check lipids today.   Followup 6 months with APP.   I spent 31 minutes reviewing records, interviewing/examining patient, and managing orders.   Ezra Shuck 09/03/2023

## 2023-09-03 NOTE — Patient Instructions (Signed)
 There has been no changes to your medications.  Labs done today, your results will be available in MyChart, we will contact you for abnormal readings.  Your physician recommends that you schedule a follow-up appointment in: 6 months.  If you have any questions or concerns before your next appointment please send [place] a message through [place] or call our office at [phone].    TO LEAVE A MESSAGE FOR THE NURSE SELECT OPTION 2, PLEASE LEAVE A MESSAGE INCLUDING: YOUR NAME DATE OF BIRTH CALL BACK NUMBER REASON FOR CALL**this is important as we prioritize the call backs  YOU WILL RECEIVE A CALL BACK THE SAME DAY AS LONG AS YOU CALL BEFORE 4:00 PM  At the [facility], you and your health needs are our priority. As part of our continuing mission to provide you with exceptional heart care, we have created designated Provider Care Teams. These Care Teams include your primary Cardiologist (physician) and Advanced Practice Providers (APPs- Physician Assistants and Nurse Practitioners) who all work together to provide you with the care you need, when you need it.   You may see any of the following providers on your designated Care Team at your next follow up: Dr Arvilla Meres Dr Marca Ancona Dr. Dorthula Nettles Dr. Clearnce Hasten Amy Filbert Schilder, NP Robbie Lis, [place] [facility] [place], [place] Brynda Peon, NP [place] Lee, NP Clarisa Kindred, NP Karle Plumber, PharmD Enos Fling, PharmD   Please be sure to bring in all your medications bottles to every appointment.    Thank you for choosing [facility]

## 2023-09-04 MED ORDER — SACUBITRIL-VALSARTAN 49-51 MG PO TABS
ORAL_TABLET | ORAL | Status: DC
Start: 2023-09-04 — End: 2023-09-24

## 2023-09-05 ENCOUNTER — Ambulatory Visit (INDEPENDENT_AMBULATORY_CARE_PROVIDER_SITE_OTHER): Admitting: Otolaryngology

## 2023-09-05 ENCOUNTER — Encounter (INDEPENDENT_AMBULATORY_CARE_PROVIDER_SITE_OTHER): Payer: Self-pay | Admitting: Otolaryngology

## 2023-09-05 VITALS — BP 98/62 | HR 93 | Ht 72.0 in | Wt 148.0 lb

## 2023-09-05 DIAGNOSIS — R0981 Nasal congestion: Secondary | ICD-10-CM | POA: Diagnosis not present

## 2023-09-05 DIAGNOSIS — J343 Hypertrophy of nasal turbinates: Secondary | ICD-10-CM | POA: Diagnosis not present

## 2023-09-05 DIAGNOSIS — J300 Vasomotor rhinitis: Secondary | ICD-10-CM

## 2023-09-05 DIAGNOSIS — H903 Sensorineural hearing loss, bilateral: Secondary | ICD-10-CM | POA: Diagnosis not present

## 2023-09-05 DIAGNOSIS — T485X5A Adverse effect of other anti-common-cold drugs, initial encounter: Secondary | ICD-10-CM

## 2023-09-05 DIAGNOSIS — H9313 Tinnitus, bilateral: Secondary | ICD-10-CM

## 2023-09-05 DIAGNOSIS — R2689 Other abnormalities of gait and mobility: Secondary | ICD-10-CM | POA: Diagnosis not present

## 2023-09-05 NOTE — Progress Notes (Signed)
 Dear Dr. Lorin, Here is my assessment for our mutual patient, Jason Day. Thank you for allowing me the opportunity to care for your patient. Please do not hesitate to contact me should you have any other questions. Sincerely, Dr. Eldora Blanch  Otolaryngology Clinic Note Referring provider: Dr. Lorin HPI:  Jason Day is a 83 y.o. male kindly referred by Dr. Lorin for evaluation of imbalance and hearing loss.  Initial visit (08/2023): Patient reports: noted chronic bilateral tinnitus and hearing loss, ongoing for several years. Some worsening, and now he is having a harder time understanding people, especially his wife. Non-pulsatile tinnitus. No recent hearing test, unsure when he had a hearing test prior (not in the last 7 ears). 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  He also reports that he has some disequilibrium when he stands up. Occurs intermittently - most of the time he gets better in a few seconds-min. No room spinning vertigo, does not occur when he turns his head or turns over in bed. Leaning down sometimes makes things better. Ongoing for last year or so. No prior balance testing. He reports having some issues with memory, more forgetful. No presyncopal episodes.   Of note, he also sees Dr. Lorin for chronic rhinitis. He reports that his nose drips like a faucet when he eats or in the morning. He does have chronic nasal congestion for which he uses afrin nightly. He denies frequent sinus infections, facial pressure/pain, discolored drainage or hyposmia. He tried atrovent  but has not used it consistently. Unclear if it helps. No significant PND. No recent medication changes. He has had prior allergy testing  Patient denies: ear pain, fullness, frank vertigo, drainage Patient additionally denies: deep pain in ear canal, eustachian tube symptoms such as popping/crackling, sensitive to pressure changes Patient also denies barotrauma, vestibular suppressant use,  ototoxic medication use Prior ear surgery: no  ENT Surgery: no  Personal or FHx of bleeding dz or anesthesia difficulty: no  AP/AC: Coumadin   Tobacco: former, quit (1970)  PMHx: Chronic rhinitis, Bradycardia s/p pacemaker, CAD s/p CABG, PAF, MR, HLD, HF, Memory impairment  Independent Review of Additional Tests or Records:  Dr. Lorin notes (05/24/2023): noted congestion after meals (river flows out of me. Noted lightheadedness on standing, no vertigo; being on a boat. H/o BPPV, noted hearing difficulties; uses HA; Dx: Vasomotor rhinitis; Rx: atrovent , ref to ENT for vertigo/imbalance BMP 09/03/2023: BUN/Cr 35/1.74; CBC 04/01/2023: no anemia CTH 02/11/2022 independently interpreted with respect to ears: cuts thick so suboptimal eval but mastoids and ME well aerated; no noted ossicular chain or otic capsule lesions  PMH/Meds/All/SocHx/FamHx/ROS:   Past Medical History:  Diagnosis Date   Acute combined systolic and diastolic congestive heart failure (HCC) 10/16/2016   Allergic rhinitis 09/01/2020   Anal fissure    Angio-edema    Bleeding hemorrhoids    Chronic combined systolic and diastolic heart failure (HCC)    Chronic sinus complaints    overuses afrin   Coronary atherosclerosis of native coronary artery    Dyspnea    Encounter for medical examination to establish care 04/22/2017   Gallstones    Hyperlipidemia    Irritable bowel syndrome 06/07/2017   Ischemic cardiomyopathy    Left atrial enlargement    Long term (current) use of anticoagulants 12/30/2020   Moderate mitral regurgitation    Myocardial infarction (HCC) 09/2015   Persistent atrial fibrillation (HCC)    RBBB    A- Fib   Shortness of breath 12/29/2020   Urinary  incontinence 12/30/2020     Past Surgical History:  Procedure Laterality Date   BACK SURGERY  ~2014   disc repair   CARDIOVERSION N/A 10/24/2018   Procedure: CARDIOVERSION;  Surgeon: Jeffrie Oneil BROCKS, MD;  Location: [facility] ENDOSCOPY;  Service:  Cardiovascular;  Laterality: N/A;   CHOLECYSTECTOMY     COLONOSCOPY  04/06/2014   Hemorrhoids and diverticulosis performed in [place]    COLONOSCOPY WITH PROPOFOL  N/A 08/04/2019   Procedure: COLONOSCOPY WITH PROPOFOL ;  Surgeon: Lennard Lesta FALCON, MD;  Location: [facility] ENDOSCOPY;  Service: Endoscopy;  Laterality: N/A;   CORONARY ARTERY BYPASS GRAFT N/A 10/18/2016   Procedure: CORONARY ARTERY BYPASS GRAFTING (CABG) x five , using left internal mammary artery and right leg greater saphenous vein harvested endoscopically;  Surgeon: Lucas Dorise POUR, MD;  Location: [facility] OR;  Service: Open Heart Surgery;  Laterality: N/A;   ESOPHAGOGASTRODUODENOSCOPY (EGD) WITH PROPOFOL  N/A 08/04/2019   Procedure: ESOPHAGOGASTRODUODENOSCOPY (EGD) WITH PROPOFOL ;  Surgeon: Lennard Lesta FALCON, MD;  Location: [facility] ENDOSCOPY;  Service: Endoscopy;  Laterality: N/A;   FOOT SURGERY     HEMORRHOID BANDING     HEMORRHOID SURGERY     PACEMAKER IMPLANT N/A 04/02/2023   Procedure: PACEMAKER IMPLANT;  Surgeon: Cindie Ole DASEN, MD;  Location: [facility] INVASIVE CV LAB;  Service: Cardiovascular;  Laterality: N/A;   REVERSE SHOULDER ARTHROPLASTY Left 08/21/2018   Procedure: REVERSE SHOULDER ARTHROPLASTY;  Surgeon: Melita Drivers, MD;  Location: [facility] ORS;  Service: Orthopedics;  Laterality: Left;   RIGHT/LEFT HEART CATH AND CORONARY ANGIOGRAPHY N/A 10/16/2016   Procedure: RIGHT/LEFT HEART CATH AND CORONARY ANGIOGRAPHY;  Surgeon: Claudene Victory ORN, MD;  Location: [facility] INVASIVE CV LAB;  Service: Cardiovascular;  Laterality: N/A;   stents in liver     TEE WITHOUT CARDIOVERSION N/A 10/18/2016   Procedure: TRANSESOPHAGEAL ECHOCARDIOGRAM (TEE);  Surgeon: Lucas Dorise POUR, MD;  Location[facility] OR;  Service: Open Heart Surgery;  Laterality: N/A;   TRANSURETHRAL RESECTION OF PROSTATE N/A 11/19/2019   Procedure: CYSTOSCOPY TRANSURETHRAL RESECTION OF THE PROSTATE (TURP);  Surgeon: Watt Rush, MD;  Location: [facility] ORS;  Service: Urology;  Laterality: N/A;    Family History  Problem  Relation Age of Onset   COPD Father    Emphysema Father    Healthy Brother    Diabetes Neg Hx    Cancer Neg Hx    Stomach cancer Neg Hx    Colon cancer Neg Hx    Allergic rhinitis Neg Hx    Angioedema Neg Hx    Asthma Neg Hx    Eczema Neg Hx    Urticaria Neg Hx      Social Connections: Moderately Isolated (03/30/2023)   Social Connection and Isolation Panel    Frequency of Communication with Friends and Family: Never    Frequency of Social Gatherings with Friends and Family: Twice a week    Attends Religious Services: More than 4 times per year    Active Member of [employer] or Organizations: No    Attends Banker Meetings: Never    Marital Status: Married      Current Outpatient Medications:    amitriptyline  (ELAVIL ) 25 MG tablet, Take 2 tablets (50 mg total) by mouth at bedtime., Disp: 180 tablet, Rfl: 3   atorvastatin  (LIPITOR) 10 MG tablet, Take 1 tablet (10 mg total) by mouth daily., Disp: 90 tablet, Rfl: 1   Azelastine -Fluticasone  137-50 MCG/ACT SUSP, Place 1 spray into the nose 2 (two) times daily., Disp: 23 g, Rfl: 0   colestipol  (COLESTID ) 1  g tablet, TAKE 1TAB BY MOUTH 2TIMES DAILY*TAKE THIS MEDICATION AT LEAST 1HR AFTER TAKING YOUR OTHER MEDS., Disp: 60 tablet, Rfl: 1   dapagliflozin  propanediol (FARXIGA ) 10 MG TABS tablet, Take 1 tablet (10 mg total) by mouth daily before breakfast., Disp: 90 tablet, Rfl: 3   Dextromethorphan-guaiFENesin (ROBITUSSIN DM PO), Take 15 mLs by mouth as needed., Disp: , Rfl:    fluticasone  (FLONASE ) 50 MCG/ACT nasal spray, Place 2 sprays into both nostrils daily., Disp: 16 g, Rfl: 2   megestrol  (MEGACE ) 20 MG tablet, Take 1 tablet (20 mg total) by mouth 2 (two) times daily., Disp: 60 tablet, Rfl: 2   metoprolol  succinate (TOPROL -XL) 25 MG 24 hr tablet, Take 0.5 tablets (12.5 mg total) by mouth daily. Take 12.5mg  daily, Disp: 45 tablet, Rfl: 3   Probiotic Product (ALIGN PO), Take 1 capsule by mouth daily., Disp: , Rfl:     sacubitril -valsartan  (ENTRESTO ) 49-51 MG, Take 1/2 tablet (24/26mg ) twice daily, Disp: , Rfl:    spironolactone  (ALDACTONE ) 25 MG tablet, Take 1 tablet (25 mg total) by mouth daily., Disp: 90 tablet, Rfl: 1   Vitamin D , Ergocalciferol , (DRISDOL ) 1.25 MG (50000 UNIT) CAPS capsule, Take 1 capsule (50,000 Units total) by mouth every 7 (seven) days., Disp: 4 capsule, Rfl: 3   warfarin (COUMADIN ) 5 MG tablet, Take 5 mg by mouth daily. Take 1 to 1.5 tablets tablet by mouth daily as directed by the coumadin  clinic, Disp: , Rfl:    warfarin (COUMADIN ) 5 MG tablet, Take 1-1.5 tablets (5-7.5 mg total) by mouth daily as directed by coumadin  clinic., Disp: 100 tablet, Rfl: 0   Physical Exam:   BP 98/62 (BP Location: Right Arm, Patient Position: Sitting, Cuff Size: Normal)   Pulse 93   Ht 6' (1.829 m)   Wt 148 lb (67.1 kg)   BMI 20.07 kg/m   Salient findings:  CN II-XII intact Given history and complaints, ear microscopy was indicated and performed for evaluation with findings as below in physical exam section and in procedures; Bilateral EAC mild non-obstructing cerumen and TM intact with well pneumatized middle ear spaces Weber 512: mid Rinne 512: AC > BC b/l  Anterior rhinoscopy: Septum modest dev right; bilateral inferior turbinates with modest hypertrophy; mild global erythema suggestive of rhinitis medicamentosa No lesions of oral cavity/oropharynx No obviously palpable neck masses/lymphadenopathy/thyromegaly No respiratory distress or stridor Head shake neg, gait not broad based, DH neg  Seprately Identifiable Procedures:  Prior to initiating any procedures, risks/benefits/alternatives were explained to the patient and verbal consent obtained. Procedure: Bilateral ear microscopy using microscope (CPT [phone]) Pre-procedure diagnosis: imbalance, hearing loss Post-procedure diagnosis: same Indication: see above; given patient's otologic complaints and history, for improved and comprehensive  examination of external ear and tympanic membrane, bilateral otologic examination using microscope was performed. Prior to proceeding, verbal consent was obtained after discussion of R/B/A  Procedure: Patient was placed semi-recumbent. Both ear canals were examined using the microscope with findings above. Patient tolerated the procedure well.   Impression & Plans:  Festus Pursel is a 83 y.o. male with:  1. Imbalance   2. 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 Chronic vasomotor rhinitis   5. Rhinitis medicamentosa   6. Nasal congestion   7. Hypertrophy of both inferior nasal turbinates    Multiple issues today: - Ear wise: not having frank vertigo, DDX is wide but does not appear consistent with BPPV; we discussed given his age and  cardiac history that most likely related to BP or multifactorial; we discussed options including formal vestib testing but he declined. He'll d/w PCP re: orthostasis and meds - Chronic hearing loss and tinnitus; no recent audio, can try masking currently but will get audio in 6 weeks - Nasal symptoms: noted chronic congestion; using afrin and tried astelin /flonase  without significant benefit. We discussed risks of afrin and rebound, but he wishes to continue with it. In addition, his other sx appear to be consistent with VM rhinitis -- not using atrovent  consistently so will start with atrovent  0.06% QID and see how he does; will do endo at next visit and discuss possible clarifix if no response   Thank you for allowing me the opportunity to care for your patient. Please do not hesitate to contact me should you have any other questions.  Sincerely, Eldora Blanch, MD Otolaryngologist (ENT), [facility] Phone: [phone] Fax: [phone]  09/05/2023, 9:17 AM   MDM:  Level 4 - [phone] Complexity/Problems addressed: mod - multiple chronic problems Data complexity: mod - independent interpretation of CT  imaging; review of notes, labs, ordering test - Morbidity: mod  - Prescription Drug prescribed or managed: y

## 2023-09-06 ENCOUNTER — Ambulatory Visit: Payer: PPO | Admitting: Internal Medicine

## 2023-09-11 ENCOUNTER — Other Ambulatory Visit (HOSPITAL_COMMUNITY)

## 2023-09-11 ENCOUNTER — Other Ambulatory Visit: Payer: Self-pay

## 2023-09-11 ENCOUNTER — Telehealth: Payer: Self-pay | Admitting: Gastroenterology

## 2023-09-11 DIAGNOSIS — Z79899 Other long term (current) drug therapy: Secondary | ICD-10-CM | POA: Diagnosis not present

## 2023-09-11 DIAGNOSIS — I5042 Chronic combined systolic (congestive) and diastolic (congestive) heart failure: Secondary | ICD-10-CM

## 2023-09-11 DIAGNOSIS — I4821 Permanent atrial fibrillation: Secondary | ICD-10-CM

## 2023-09-11 NOTE — Progress Notes (Signed)
 Phone call from [facility] as BMET just released from previous order cannot be seen.  Requested order be replaced and released.

## 2023-09-11 NOTE — Telephone Encounter (Signed)
 Spoke with pts wife and let her know that there is an appt in the computer for pt to have labs drawn today at 2:15pm at [facility].

## 2023-09-11 NOTE — Telephone Encounter (Signed)
 Patient wife called and stated that she called the [facility] vascular clinic in regards to her husband lab appointment and they stated that her husband did not have an appointment with them for today at 2:15 pm. Patient wife is wanting to confirm if she was right that her husband appointment is for today at 2:15 at the [facility]. Patient wife is requesting a call back. Please advise.

## 2023-09-12 ENCOUNTER — Ambulatory Visit (HOSPITAL_COMMUNITY): Payer: Self-pay | Admitting: Cardiology

## 2023-09-12 DIAGNOSIS — I5042 Chronic combined systolic (congestive) and diastolic (congestive) heart failure: Secondary | ICD-10-CM

## 2023-09-12 LAB — BASIC METABOLIC PANEL WITH GFR
BUN/Creatinine Ratio: 20 (ref 10–24)
BUN: 38 mg/dL — ABNORMAL HIGH (ref 8–27)
CO2: 22 mmol/L (ref 20–29)
Calcium: 9 mg/dL (ref 8.6–10.2)
Chloride: 102 mmol/L (ref 96–106)
Creatinine, Ser: 1.87 mg/dL — ABNORMAL HIGH (ref 0.76–1.27)
Glucose: 92 mg/dL (ref 70–99)
Potassium: 4.7 mmol/L (ref 3.5–5.2)
Sodium: 142 mmol/L (ref 134–144)
eGFR: 35 mL/min/1.73 — ABNORMAL LOW (ref >59)

## 2023-09-12 MED ORDER — SPIRONOLACTONE 25 MG PO TABS: 12.5000 mg | ORAL_TABLET | Freq: Every day | ORAL | Status: DC

## 2023-09-12 NOTE — Telephone Encounter (Signed)
 Patients wife advised and verbalized understanding,lab appointment scheduled,lab orders entered. Med list updated to reflect changes.   Orders Placed This Encounter  Procedures   Basic metabolic panel with GFR    Standing Status:   Future    Expected Date:   09/19/2023    Expiration Date:   09/11/2024    Release to patient:   Immediate    Release to patient:   Immediate [1]   Meds ordered this encounter  Medications   spironolactone  (ALDACTONE ) 25 MG tablet    Sig: Take 0.5 tablets (12.5 mg total) by mouth daily.    Please cancel all previous orders for current medication. Change in dosage or pill size.

## 2023-09-12 NOTE — Telephone Encounter (Signed)
-----   Message from Ezra Shuck sent at 09/12/2023 11:20 AM EDT ----- Make sure he decreased Entresto  to 24/26 bid.  Decrease spironolactone  to 12.5 daily.  Increase hydration.  Repeat BMET 1 week.  ----- Message ----- From: Interface, [facility] Lab Results In Sent: 09/12/2023   2:35 AM EDT To: Ezra GORMAN Shuck, MD

## 2023-09-16 ENCOUNTER — Other Ambulatory Visit: Payer: Self-pay

## 2023-09-17 ENCOUNTER — Other Ambulatory Visit (HOSPITAL_COMMUNITY): Payer: Self-pay

## 2023-09-19 ENCOUNTER — Ambulatory Visit (HOSPITAL_COMMUNITY)
Admission: RE | Admit: 2023-09-19 | Discharge: 2023-09-19 | Disposition: A | Discharge status: Home / Self Care | Source: Ambulatory Visit | Attending: Cardiology | Admitting: Cardiology

## 2023-09-19 DIAGNOSIS — I5042 Chronic combined systolic (congestive) and diastolic (congestive) heart failure: Secondary | ICD-10-CM | POA: Insufficient documentation

## 2023-09-19 LAB — BASIC METABOLIC PANEL WITH GFR
Anion gap: 8 (ref 5–15)
BUN: 39 mg/dL — ABNORMAL HIGH (ref 8–23)
CO2: 24 mmol/L (ref 22–32)
Calcium: 9.2 mg/dL (ref 8.9–10.3)
Chloride: 106 mmol/L (ref 98–111)
Creatinine, Ser: 2.17 mg/dL — ABNORMAL HIGH (ref 0.61–1.24)
GFR, Estimated: 30 mL/min — ABNORMAL LOW (ref >60)
Glucose, Bld: 88 mg/dL (ref 70–99)
Potassium: 5.1 mmol/L (ref 3.5–5.1)
Sodium: 138 mmol/L (ref 135–145)

## 2023-09-20 ENCOUNTER — Ambulatory Visit (HOSPITAL_COMMUNITY): Payer: Self-pay | Admitting: Cardiology

## 2023-09-20 DIAGNOSIS — I5042 Chronic combined systolic (congestive) and diastolic (congestive) heart failure: Secondary | ICD-10-CM

## 2023-09-23 ENCOUNTER — Other Ambulatory Visit (HOSPITAL_COMMUNITY): Payer: Self-pay

## 2023-09-23 ENCOUNTER — Other Ambulatory Visit: Payer: Self-pay | Admitting: Cardiovascular Disease

## 2023-09-23 MED ORDER — SACUBITRIL-VALSARTAN 24-26 MG PO TABS
1.0000 | ORAL_TABLET | Freq: Two times a day (BID) | ORAL | 0 refills | Status: DC
  Filled 2023-09-23: qty 180, 90d supply, fill #0

## 2023-09-24 ENCOUNTER — Other Ambulatory Visit (HOSPITAL_COMMUNITY): Payer: Self-pay

## 2023-09-24 ENCOUNTER — Other Ambulatory Visit: Payer: Self-pay

## 2023-09-24 MED ORDER — VALSARTAN 40 MG PO TABS
20.0000 mg | ORAL_TABLET | Freq: Every day | ORAL | 3 refills | Status: DC
  Filled 2023-09-24: qty 30, 60d supply, fill #0

## 2023-09-25 ENCOUNTER — Ambulatory Visit: Payer: Self-pay

## 2023-09-25 NOTE — Telephone Encounter (Signed)
 FYI Only or Action Required?: FYI only for provider.  Patient was last seen in primary care on 07/16/2023 by Jason Dawes, MD.  Called Nurse Triage reporting Weight Loss.  Symptoms began ongoing x 6 years, worsening x 2 months.  Interventions attempted: Dietary changes.  Symptoms are: weight loss (17 pounds over the past 2 months), bilateral ear congestion and decreased hearing (intermittent, seeing Dr Jason Day for it) rapidly worsening.  Triage Disposition: See PCP When Office is Open (Within 3 Days)  Patient/caregiver understands and will follow disposition?: Yes            Copied from CRM #[id number]. Topic: Clinical - Medical Advice >> Sep 25, 2023  8:49 AM Jason Day wrote: Reason for CRM: Patient called. Would like to leave message for Jason Day to call his wife. Patient down to 136 lbs. Heart doctor was supposed to contact [place] about it. Patient hasn't heard anything and that was a month ago. He needs something to help him stop losing or help him gain some weight. Would like someone to call his wife back at [phone]. Thank You Reason for Disposition  MODERATE unexplained weight loss (e.g., 5%, 8 to 10 pounds [4 - 4.5 kg] in person who weighs 170 to 200 pounds [75 - 90 kg])  Answer Assessment - Initial Assessment Questions 1. MAIN CONCERN: What is your main concern today?     Wife states the pills, powders, appetite excitements, Ensure are not working. She is asking if there is any kind of infusions that he can get to keep his weight up. She states he is followed closely by cardiology. She states this has been an issue since his heart attack in 2018.  2. WEIGHT LOSS: How much weight have you lost?  (e.g., lbs., kgs.)  Over what period of time have you lost this weight?  (e.g., number of days, weeks, months, years)     Wife states he is now down to 136 pounds. Patient was 153 at his last visit at [facility] on 07/16/23. 17 pound unintentional weight loss in about a 2 month time  span.  3. BASELINE WEIGHT: What is your baseline or normal weight? (e.g., How much do you usually weigh?)     Wife states the patient was able to hold around 152 for a while and that was his baseline. She states before he had his heart attack in 2018 he was over 200 pounds.  4. CAUSE: What do you think is causing the weight loss? (e.g., depression, anxiety, medicine side effect, pain, trouble swallowing, substance or alcohol  use problem, eating disorder)     Wife states the patient is eating full meals (she states about 5-6 small meals daily), she cooks most meals for him. She states he is on the heart healthy diet.   5. PRIOR EVALUATION: Have you been evaluated by a doctor for your weight loss? If Yes, ask When was your last visit? What did your doctor (or NP/PA) tell you about the possible cause?     Yes, she states they have seen Dr Jason Day and PA Jason Day for the weight loss. They have tried Ensure, pills and powders.  6. HEART FAILURE TREATMENT: Do you have heart failure? If Yes, ask: Have you taken new or extra water  pills (diuretics) recently? (e.g., furosemide ; bumetanide). What is your target weight?     Yes. Wife states he has not started any new or extra diuretics. She states they cut his Entresto  dosage down and have now stopped the medication. She  states his target weight was 175 after his first heart attack in 2018. She states it was able to hold for a couple years. She states 150 pounds was then okay as his target weight from cardiology.  7. OTHER SYMPTOMS: Do you have any other symptoms? (e.g., anxiety or depression, blood in stool, breathing difficulty, diarrhea, fever, trouble swallowing)     Denies loose stools or diarrhea, nausea, vomiting, increased urinary frequency. Wife states he has a history of IBS, it's like he goes to the bathroom and does not finish. He goes and then is back in within 20 minutes. She states he has been having decreased hearing from  ear congestion and they followed up with Dr Jason Day (ear specialist).  8. PREGNANCY: Is there any chance you are pregnant? When was your last menstrual period?     N/A.  Protocols used: Weight Loss - Unintended-A-AH

## 2023-09-26 ENCOUNTER — Ambulatory Visit: Admitting: Medical

## 2023-09-26 VITALS — BP 90/54 | HR 60 | Ht 73.0 in | Wt 143.4 lb

## 2023-09-26 DIAGNOSIS — I25810 Atherosclerosis of coronary artery bypass graft(s) without angina pectoris: Secondary | ICD-10-CM | POA: Diagnosis not present

## 2023-09-26 DIAGNOSIS — I4821 Permanent atrial fibrillation: Secondary | ICD-10-CM | POA: Diagnosis not present

## 2023-09-26 DIAGNOSIS — R634 Abnormal weight loss: Secondary | ICD-10-CM | POA: Diagnosis not present

## 2023-09-26 DIAGNOSIS — E739 Lactose intolerance, unspecified: Secondary | ICD-10-CM | POA: Diagnosis not present

## 2023-09-26 DIAGNOSIS — E440 Moderate protein-calorie malnutrition: Secondary | ICD-10-CM | POA: Diagnosis not present

## 2023-09-26 DIAGNOSIS — N1831 Chronic kidney disease, stage 3a: Secondary | ICD-10-CM

## 2023-09-26 DIAGNOSIS — K529 Noninfective gastroenteritis and colitis, unspecified: Secondary | ICD-10-CM | POA: Diagnosis not present

## 2023-09-26 DIAGNOSIS — I5042 Chronic combined systolic (congestive) and diastolic (congestive) heart failure: Secondary | ICD-10-CM | POA: Diagnosis not present

## 2023-09-26 DIAGNOSIS — F320 Major depressive disorder, single episode, mild: Secondary | ICD-10-CM | POA: Diagnosis not present

## 2023-09-26 DIAGNOSIS — R630 Anorexia: Secondary | ICD-10-CM

## 2023-09-26 NOTE — Telephone Encounter (Signed)
 Thinking through options, either one of the medicines I mentioned today would require some adjustments for example, we would need to have him have his INR  Coumadin  test maybe weekly or every other week for a month while we initiate some of these medications  I would also feel more comfortable cutting back amitriptyline  to 1 tablet daily if we do start either one of the medications I mentioned  So if agreeable I will start 1 medicine first such as the cyproheptadine to see if it helps with appetite  We would need to have him get his INR checked in 1 or 2 weeks instead of month with his Coumadin  clinic  And if not seeing improvement after 1 to 2 weeks with the cyproheptadine regarding appetite we could add an antidepressant such as either Pristiq  or Paxil.  But again both of these require an adjustment down of the amitriptyline  and more frequent monitoring of his Coumadin  lab in the short-term  So if agreeable let me send cyproheptadine first.   lets try this for appetite for the next 2 weeks before adding the antidepressant

## 2023-09-26 NOTE — Progress Notes (Signed)
 Subjective:  Jason Day is a 83 y.o. male who presents for Chief Complaint  Patient presents with   Acute Visit    Weight loss- eating but still losing weight. Snacks throughout the day but will eat one big meal a day. Nothing is helping to make him gain weight. No muscle or fat left on body per patient and wife.      Here with wife for concerns about him losing weight and not gaining weight.  The has some chronic issues with his bowels and stomach problems.  He sees GI and he sees cardiology.  Due to some issues with his creatinine he was discontinued on Entresto  yesterday.  They have concerns about what he can do to boost his weight.  He eats but not enough and he does not have a lot of appetite.  He tried megestrol  earlier in the year but that did not help.  He has been on another appetite booster in the past that did not help all that much either  His wife feels like his body cannot keep up like this being underweight and malnourished  They declined seeing a nutritionist  2 years ago he was 152 pounds and stayed that way for a while but in the last several months he has been losing weight  They have tried protein shakes, Ensure, eating more fish per Dr. Stacia.  He eats chicken already quite a bit.  His mood is down somewhat dealing with all the issues with his health  He has a history of elevated PSA and has seen urology for this in the past and was told he did not need to come back  No blood in the stool, no hemoptysis, no fever or night sweats, no other new symptoms.  No other aggravating or relieving factors.    No other c/o.  Past Medical History:  Diagnosis Date   Acute combined systolic and diastolic congestive heart failure (HCC) 10/16/2016   Allergic rhinitis 09/01/2020   Anal fissure    Angio-edema    Bleeding hemorrhoids    Chronic combined systolic and diastolic heart failure (HCC)    Chronic sinus complaints    overuses afrin   Coronary atherosclerosis of  native coronary artery    Dyspnea    Encounter for medical examination to establish care 04/22/2017   Gallstones    Hyperlipidemia    Irritable bowel syndrome 06/07/2017   Ischemic cardiomyopathy    Left atrial enlargement    Long term (current) use of anticoagulants 12/30/2020   Moderate mitral regurgitation    Myocardial infarction (HCC) 09/2015   Persistent atrial fibrillation (HCC)    RBBB    A- Fib   Shortness of breath 12/29/2020   Urinary incontinence 12/30/2020   Current Outpatient Medications on File Prior to Visit  Medication Sig Dispense Refill   amitriptyline  (ELAVIL ) 25 MG tablet Take 2 tablets (50 mg total) by mouth at bedtime. 180 tablet 3   atorvastatin  (LIPITOR) 10 MG tablet Take 1 tablet (10 mg total) by mouth daily. 90 tablet 1   dapagliflozin  propanediol (FARXIGA ) 10 MG TABS tablet Take 1 tablet (10 mg total) by mouth daily before breakfast. 90 tablet 3   metoprolol  succinate (TOPROL -XL) 25 MG 24 hr tablet Take 0.5 tablets (12.5 mg total) by mouth daily. Take 12.5mg  daily 45 tablet 3   spironolactone  (ALDACTONE ) 25 MG tablet Take 0.5 tablets (12.5 mg total) by mouth daily.     valsartan  (DIOVAN ) 40 MG tablet Take 0.5 tablets (  20 mg total) by mouth daily. 30 tablet 3   Vitamin D , Ergocalciferol , (DRISDOL ) 1.25 MG (50000 UNIT) CAPS capsule Take 1 capsule (50,000 Units total) by mouth every 7 (seven) days. 4 capsule 3   warfarin (COUMADIN ) 5 MG tablet Take 1-1.5 tablets (5-7.5 mg total) by mouth daily as directed by coumadin  clinic. 100 tablet 0   No current facility-administered medications on file prior to visit.    The following portions of the patient's history were reviewed and updated as appropriate: allergies, current medications, past family history, past medical history, past social history, past surgical history and problem list.  ROS Otherwise as in subjective above    Objective: BP (!) 90/54   Pulse 60   Ht 6' 1 (1.854 m)   Wt 143 lb 6.4 oz (65  kg)   BMI 18.92 kg/m   Wt Readings from Last 3 Encounters:  09/26/23 143 lb 6.4 oz (65 kg)  09/05/23 148 lb (67.1 kg)  08/28/23 143 lb 6.4 oz (65 kg)   BP Readings from Last 3 Encounters:  09/26/23 (!) 90/54  09/05/23 98/62  09/03/23 (!) 95/50    General appearance: alert, no distress, well developed, well nourished, emaciated appearing Neck: supple, no lymphadenopathy, no thyromegaly, no masses Heart: RRR, normal S1, S2, no murmurs Lungs: CTA bilaterally, no wheezes, rhonchi, or rales Abdomen: +bs, soft, non tender, non distended, no masses, no hepatomegaly, no splenomegaly Pulses: 2+ radial pulses, 1+ pedal pulses, normal cap refill Ext: no edema, but he does have some purplish coloration of the feet and ankles   Assessment: Encounter Diagnoses  Name Primary?   Weight loss Yes   Malnutrition of moderate degree (HCC)    Atherosclerosis of coronary artery bypass graft of native heart without angina pectoris    Chronic combined systolic and diastolic heart failure (HCC)    Chronic diarrhea    CKD stage 3a, GFR 45-59 ml/min (HCC)    Depression, major, single episode, mild (HCC)    Permanent atrial fibrillation (HCC)    Lactose intolerance    Appetite impaired      Plan: We discussed the current symptoms and issues with weight and appetite  He has not improved with appetite and weight on protein powder, Ensure shakes or Megace .  They declined nutritionist consult today.  I reviewed recent blood work in the chart record.  He is dealing with some elevated creatinine of late.  Entresto  was discontinued yesterday by cardiology.  His blood pressure is low.   Entresto  was just discontinued yesterday.  Continue to monitor closely but blood pressure readings at home.  Follow-up with cardiology  We discussed possible different medicines that can help with appetite including cyproheptadine, antidepressant such as Paxil or Pristiq .  I will discuss with the pharmacist as well  I  recommend he continue Ensure shakes twice daily, consider continuing protein powder and beverage twice a day.  He has lactose intolerant.  We discussed other worrisome causes of weight loss.  I reviewed the CT abdomen pelvis from 04/29/23 and CXR from 04/03/23.  He declines chest CT today and repeat PSA.  Labs as below.  We will call back and discuss medications further once I speak to the pharmacist   Travius was seen today for acute visit.  Diagnoses and all orders for this visit:  Weight loss -     CBC with Differential/Platelet -     TSH + free T4 -     Lactate dehydrogenase  Malnutrition of moderate  degree (HCC) -     CBC with Differential/Platelet -     TSH + free T4 -     Lactate dehydrogenase  Atherosclerosis of coronary artery bypass graft of native heart without angina pectoris  Chronic combined systolic and diastolic heart failure (HCC)  Chronic diarrhea  CKD stage 3a, GFR 45-59 ml/min (HCC)  Depression, major, single episode, mild (HCC)  Permanent atrial fibrillation (HCC)  Lactose intolerance  Appetite impaired    Follow up: Pending labs

## 2023-09-27 ENCOUNTER — Ambulatory Visit: Payer: Self-pay | Admitting: Medical

## 2023-09-27 ENCOUNTER — Other Ambulatory Visit: Payer: Self-pay

## 2023-09-27 ENCOUNTER — Other Ambulatory Visit (HOSPITAL_COMMUNITY): Payer: Self-pay

## 2023-09-27 ENCOUNTER — Other Ambulatory Visit: Payer: Self-pay | Admitting: Medical

## 2023-09-27 DIAGNOSIS — E440 Moderate protein-calorie malnutrition: Secondary | ICD-10-CM

## 2023-09-27 DIAGNOSIS — R634 Abnormal weight loss: Secondary | ICD-10-CM

## 2023-09-27 LAB — CBC WITH DIFFERENTIAL/PLATELET
Basophils Absolute: 0 x10E3/uL (ref 0.0–0.2)
Basos: 0 %
EOS (ABSOLUTE): 0.1 x10E3/uL (ref 0.0–0.4)
Eos: 2 %
Hematocrit: 40.9 % (ref 37.5–51.0)
Hemoglobin: 13.6 g/dL (ref 13.0–17.7)
Immature Grans (Abs): 0 x10E3/uL (ref 0.0–0.1)
Immature Granulocytes: 0 %
Lymphocytes Absolute: 1.2 x10E3/uL (ref 0.7–3.1)
Lymphs: 27 %
MCH: 32.4 pg (ref 26.6–33.0)
MCHC: 33.3 g/dL (ref 31.5–35.7)
MCV: 97 fL (ref 79–97)
Monocytes Absolute: 0.4 x10E3/uL (ref 0.1–0.9)
Monocytes: 9 %
Neutrophils Absolute: 2.8 x10E3/uL (ref 1.4–7.0)
Neutrophils: 62 %
Platelets: 111 x10E3/uL — ABNORMAL LOW (ref 150–450)
RBC: 4.2 x10E6/uL (ref 4.14–5.80)
RDW: 13.4 % (ref 11.6–15.4)
WBC: 4.6 x10E3/uL (ref 3.4–10.8)

## 2023-09-27 LAB — TSH+FREE T4
Free T4: 1.08 ng/dL (ref 0.82–1.77)
TSH: 2.83 u[IU]/mL (ref 0.450–4.500)

## 2023-09-27 LAB — LACTATE DEHYDROGENASE: LDH: 181 IU/L (ref 121–224)

## 2023-09-27 MED ORDER — DESVENLAFAXINE SUCCINATE ER 50 MG PO TB24
50.0000 mg | ORAL_TABLET | Freq: Every day | ORAL | 1 refills | Status: DC
  Filled 2023-09-27 (×2): qty 30, 30d supply, fill #0
  Filled 2023-11-02 – 2023-11-04 (×2): qty 30, 30d supply, fill #1
  Filled 2023-11-04: qty 30, 30d supply, fill #0

## 2023-09-27 NOTE — Telephone Encounter (Signed)
 Agreeable to do amtriptyline 1 tablet daily  Would like to go on Pristiq  or Paxil first without trying the cyproheptadine 1st. Send to [facility]  Couadmin every week . I have place lab orders for the next month

## 2023-09-27 NOTE — Progress Notes (Signed)
 There is a separate telephone encounter from yesterday.  I have included in this note as well   Your white and red cells are normal, platelets stable, thyroid  okay, LDH marker okay.   Thinking through options, either one of the medicines I mentioned today would require some adjustments for example, we would need to have him have his INR  Coumadin  test maybe weekly or every other week for a month while we initiate some of these medications   I would also feel more comfortable cutting back amitriptyline  to 1 tablet daily if we do start either one of the medications I mentioned   So if agreeable I will start 1 medicine first such as the cyproheptadine to see if it helps with appetite   We would need to have him get his INR checked in 1 or 2 weeks instead of month with his Coumadin  clinic   And if not seeing improvement after 1 to 2 weeks with the cyproheptadine regarding appetite we could add an antidepressant such as either Pristiq  or Paxil.  But again both of these require an adjustment down of the amitriptyline  and more frequent monitoring of his Coumadin  lab in the short-term   So if agreeable let me send cyproheptadine first.   lets try this for appetite for the next 2 weeks before adding the antidepressant

## 2023-10-01 ENCOUNTER — Other Ambulatory Visit

## 2023-10-01 ENCOUNTER — Other Ambulatory Visit: Payer: Self-pay | Admitting: Cardiovascular Disease

## 2023-10-01 ENCOUNTER — Other Ambulatory Visit: Payer: Self-pay

## 2023-10-01 ENCOUNTER — Other Ambulatory Visit (HOSPITAL_COMMUNITY): Payer: Self-pay

## 2023-10-01 MED ORDER — ATORVASTATIN CALCIUM 10 MG PO TABS
10.0000 mg | ORAL_TABLET | Freq: Every day | ORAL | 3 refills | Status: DC
  Filled 2023-10-01: qty 90, 90d supply, fill #0

## 2023-10-02 ENCOUNTER — Ambulatory Visit: Payer: Self-pay | Admitting: Medical

## 2023-10-02 ENCOUNTER — Other Ambulatory Visit

## 2023-10-02 DIAGNOSIS — R634 Abnormal weight loss: Secondary | ICD-10-CM | POA: Diagnosis not present

## 2023-10-02 DIAGNOSIS — E440 Moderate protein-calorie malnutrition: Secondary | ICD-10-CM | POA: Diagnosis not present

## 2023-10-02 LAB — PROTIME-INR
INR: 2.3 — ABNORMAL HIGH (ref 0.9–1.2)
Prothrombin Time: 25.2 s — ABNORMAL HIGH (ref 9.1–12.0)

## 2023-10-02 NOTE — Progress Notes (Signed)
 INR is not much different than typical.  So so far the new medication is not affecting the INR bleeding time  We can recheck again here or with Coumadin  clinic in 3-4 more weeks

## 2023-10-03 ENCOUNTER — Ambulatory Visit (HOSPITAL_COMMUNITY): Payer: Self-pay | Admitting: Cardiology

## 2023-10-03 ENCOUNTER — Ambulatory Visit (HOSPITAL_COMMUNITY)
Admission: RE | Admit: 2023-10-03 | Discharge: 2023-10-03 | Disposition: A | Discharge status: Home / Self Care | Source: Ambulatory Visit | Attending: Cardiology | Admitting: Cardiology

## 2023-10-03 DIAGNOSIS — I5042 Chronic combined systolic (congestive) and diastolic (congestive) heart failure: Secondary | ICD-10-CM | POA: Diagnosis not present

## 2023-10-03 LAB — BASIC METABOLIC PANEL WITH GFR
Anion gap: 8 (ref 5–15)
BUN: 36 mg/dL — ABNORMAL HIGH (ref 8–23)
CO2: 25 mmol/L (ref 22–32)
Calcium: 9 mg/dL (ref 8.9–10.3)
Chloride: 106 mmol/L (ref 98–111)
Creatinine, Ser: 1.5 mg/dL — ABNORMAL HIGH (ref 0.61–1.24)
GFR, Estimated: 46 mL/min — ABNORMAL LOW (ref >60)
Glucose, Bld: 103 mg/dL — ABNORMAL HIGH (ref 70–99)
Potassium: 4.7 mmol/L (ref 3.5–5.1)
Sodium: 139 mmol/L (ref 135–145)

## 2023-10-07 ENCOUNTER — Telehealth (HOSPITAL_COMMUNITY): Payer: Self-pay | Admitting: Cardiology

## 2023-10-07 ENCOUNTER — Encounter (HOSPITAL_COMMUNITY): Payer: Self-pay | Admitting: Cardiology

## 2023-10-07 ENCOUNTER — Ambulatory Visit: Payer: Self-pay

## 2023-10-07 NOTE — Telephone Encounter (Signed)
 Patients wife confirmed readings Systolic 80-90 diastolic 100-110  Will send  mychart message with photos   While on the phone b/p reading 104/62 mmhg 82

## 2023-10-07 NOTE — Telephone Encounter (Signed)
 Addressed in [place] encounter

## 2023-10-07 NOTE — Telephone Encounter (Signed)
 Patients wife left VM on triage line Reports they were advised to monitor b/p x 1 week per PCP   B/p readings 80-90/100-110 88/110 84/100 108/67 110/74  Information provided to PCP however wife felt cardiology should know as well. *note pending PCP message in process*  Please advise

## 2023-10-07 NOTE — Telephone Encounter (Signed)
 FYI Only or Action Required?: Action required by provider: clinical question for provider and update on patient condition.  Patient was last seen in primary care on 09/26/2023 by [name], PA-C.  Called Nurse Triage reporting Dizziness and Hypotension.  Symptoms began several weeks ago.  Interventions attempted: Rest, hydration, or home remedies.  Symptoms are: unchanged.  Triage Disposition: See HCP Within 4 Hours (Or PCP Triage)  Patient/caregiver understands and will follow disposition?: No, wishes to speak with PCP   Copied from CRM #[id number]. Topic: Clinical - Red Word Triage >> Oct 07, 2023  9:06 AM Myrick T wrote: Red Word that prompted transfer to Nurse Triage: patients wife has been taking his blood pressure for a week and his numbers have been very low. Patient has been dizzy and sleeping a lot more than usual. Reason for Disposition  [1] Systolic BP < 90 AND [2] NOT feeling weak or lightheaded  Answer Assessment - Initial Assessment Questions 1. BLOOD PRESSURE: What is your blood pressure? Did you take at least two measurements 5 minutes apart?     110/79, 88/74 2. ONSET: When did you take your blood pressure?     This morning 3. HOW: How did you take your blood pressure? (e.g., visiting nurse, automatic home BP monitor)     Home cuff 4. HISTORY: Do you have a history of low blood pressure? What is your blood pressure normally?     yes 5. MEDICINES: Are you taking any medicines for blood pressure? If Yes, ask: Have they been changed recently?     Entresto  discontinued 6. PULSE RATE: Do you know what your pulse rate is?      75 7. OTHER SYMPTOMS: Have you been sick recently? Have you had a recent injury?     Intermittent dizziness, mild this morning at breakfast with bp 88/74  Additional info: 1) 1.5 weeks ago evaluated for hypotension was advised to monitor bp for one week. BP Ranging 80's/50's-110's/80's. Wondering if medication can be  prescribed to increase blood pressure.  2) Appetite is improving.  3) Offered follow up visit but spouse would like to check with PCP for recommendation, such as medication, but willing for follow up appointment if pcp wishes. Fyi please follow up on wife's phone, patient will not answer his phone, screens calls.  Protocols used: Blood Pressure - Low-A-AH

## 2023-10-07 NOTE — Telephone Encounter (Signed)
 Called patient wife and advised about stopping Valsartan . She understood and said she will check back and let us  know how his BP is doing

## 2023-10-08 NOTE — Telephone Encounter (Signed)
 LVM for pt to call us  back regarding meds

## 2023-10-09 NOTE — Telephone Encounter (Signed)
 LVM again for pt to call us  back regarding meds

## 2023-10-10 ENCOUNTER — Telehealth: Payer: Self-pay

## 2023-10-10 ENCOUNTER — Telehealth (HOSPITAL_COMMUNITY): Payer: Self-pay

## 2023-10-10 NOTE — Telephone Encounter (Signed)
 LVM for 3rd time regarding meds. MyChart message sent.

## 2023-10-10 NOTE — Telephone Encounter (Signed)
 Advanced Heart Failure Triage Encounter  Patient Name: Jason Day  Date of Call: 10/10/23  Problem:  Patients wife called to report patients bp readings without Valsartan .   8/19- 110 60 HR 83  8/20-108/66 HR 78  8/21-110/77 HR 89  Wife would like to know if patient should stay off Valsartan  and would like to know what should they do about entresto . PCP would like for patient to spironolactone  12.5 mg daily. Patients wife would like to know if  Dr. Rolan would agree with him starting mediation.    Plan:  Sent to provider for further review  Rolin LOISE Height, CMA

## 2023-10-10 NOTE — Telephone Encounter (Deleted)
Visit made in error

## 2023-10-10 NOTE — Telephone Encounter (Signed)
  For now stay off valsartan .   I reviewed lab work from 09/19/22 & 10/03/23.   Hold off on Spironolactone .   Please call back.   Keyanna Sandefer NP-C  10:14 AM

## 2023-10-10 NOTE — Telephone Encounter (Signed)
 I spoke with pt's wife, she wants us  to know his cardiologist took him off of his two BP meds.

## 2023-10-10 NOTE — Telephone Encounter (Signed)
 Patient Notified

## 2023-10-14 ENCOUNTER — Encounter: Payer: Self-pay | Admitting: *Deleted

## 2023-10-14 ENCOUNTER — Other Ambulatory Visit: Payer: Self-pay

## 2023-10-14 ENCOUNTER — Encounter (HOSPITAL_COMMUNITY): Payer: Self-pay

## 2023-10-14 ENCOUNTER — Inpatient Hospital Stay (HOSPITAL_COMMUNITY)
Admission: EM | Admit: 2023-10-14 | Discharge: 2023-10-21 | DRG: 813 | Disposition: A | Discharge status: Home / Self Care | Source: Ambulatory Visit | Attending: Internal Medicine | Admitting: Internal Medicine

## 2023-10-14 ENCOUNTER — Ambulatory Visit: Payer: Self-pay

## 2023-10-14 DIAGNOSIS — Z950 Presence of cardiac pacemaker: Secondary | ICD-10-CM

## 2023-10-14 DIAGNOSIS — F0390 Unspecified dementia without behavioral disturbance: Secondary | ICD-10-CM | POA: Diagnosis present

## 2023-10-14 DIAGNOSIS — R7401 Elevation of levels of liver transaminase levels: Secondary | ICD-10-CM | POA: Diagnosis not present

## 2023-10-14 DIAGNOSIS — I4891 Unspecified atrial fibrillation: Secondary | ICD-10-CM | POA: Diagnosis not present

## 2023-10-14 DIAGNOSIS — R4182 Altered mental status, unspecified: Secondary | ICD-10-CM | POA: Diagnosis not present

## 2023-10-14 DIAGNOSIS — E861 Hypovolemia: Secondary | ICD-10-CM | POA: Diagnosis not present

## 2023-10-14 DIAGNOSIS — N189 Chronic kidney disease, unspecified: Secondary | ICD-10-CM | POA: Diagnosis not present

## 2023-10-14 DIAGNOSIS — N1832 Chronic kidney disease, stage 3b: Secondary | ICD-10-CM | POA: Diagnosis not present

## 2023-10-14 DIAGNOSIS — I252 Old myocardial infarction: Secondary | ICD-10-CM

## 2023-10-14 DIAGNOSIS — I5022 Chronic systolic (congestive) heart failure: Secondary | ICD-10-CM | POA: Diagnosis not present

## 2023-10-14 DIAGNOSIS — K761 Chronic passive congestion of liver: Secondary | ICD-10-CM | POA: Diagnosis not present

## 2023-10-14 DIAGNOSIS — I4821 Permanent atrial fibrillation: Secondary | ICD-10-CM | POA: Diagnosis present

## 2023-10-14 DIAGNOSIS — K760 Fatty (change of) liver, not elsewhere classified: Secondary | ICD-10-CM | POA: Diagnosis not present

## 2023-10-14 DIAGNOSIS — I255 Ischemic cardiomyopathy: Secondary | ICD-10-CM | POA: Diagnosis present

## 2023-10-14 DIAGNOSIS — E872 Acidosis, unspecified: Secondary | ICD-10-CM | POA: Diagnosis present

## 2023-10-14 DIAGNOSIS — I472 Ventricular tachycardia, unspecified: Secondary | ICD-10-CM | POA: Diagnosis present

## 2023-10-14 DIAGNOSIS — R413 Other amnesia: Secondary | ICD-10-CM | POA: Diagnosis present

## 2023-10-14 DIAGNOSIS — D6832 Hemorrhagic disorder due to extrinsic circulating anticoagulants: Secondary | ICD-10-CM | POA: Diagnosis not present

## 2023-10-14 DIAGNOSIS — I6782 Cerebral ischemia: Secondary | ICD-10-CM | POA: Diagnosis not present

## 2023-10-14 DIAGNOSIS — K7200 Acute and subacute hepatic failure without coma: Secondary | ICD-10-CM | POA: Diagnosis not present

## 2023-10-14 DIAGNOSIS — K625 Hemorrhage of anus and rectum: Principal | ICD-10-CM | POA: Diagnosis present

## 2023-10-14 DIAGNOSIS — D696 Thrombocytopenia, unspecified: Secondary | ICD-10-CM | POA: Diagnosis present

## 2023-10-14 DIAGNOSIS — T45515A Adverse effect of anticoagulants, initial encounter: Secondary | ICD-10-CM | POA: Diagnosis not present

## 2023-10-14 DIAGNOSIS — I5023 Acute on chronic systolic (congestive) heart failure: Secondary | ICD-10-CM | POA: Diagnosis not present

## 2023-10-14 DIAGNOSIS — K649 Unspecified hemorrhoids: Secondary | ICD-10-CM | POA: Diagnosis not present

## 2023-10-14 DIAGNOSIS — I5082 Biventricular heart failure: Secondary | ICD-10-CM | POA: Diagnosis not present

## 2023-10-14 DIAGNOSIS — I2510 Atherosclerotic heart disease of native coronary artery without angina pectoris: Secondary | ICD-10-CM | POA: Diagnosis present

## 2023-10-14 DIAGNOSIS — K5730 Diverticulosis of large intestine without perforation or abscess without bleeding: Secondary | ICD-10-CM | POA: Diagnosis present

## 2023-10-14 DIAGNOSIS — R64 Cachexia: Secondary | ICD-10-CM | POA: Diagnosis not present

## 2023-10-14 DIAGNOSIS — N1831 Chronic kidney disease, stage 3a: Secondary | ICD-10-CM | POA: Diagnosis not present

## 2023-10-14 DIAGNOSIS — Z7901 Long term (current) use of anticoagulants: Secondary | ICD-10-CM

## 2023-10-14 DIAGNOSIS — K648 Other hemorrhoids: Secondary | ICD-10-CM | POA: Diagnosis present

## 2023-10-14 DIAGNOSIS — Z825 Family history of asthma and other chronic lower respiratory diseases: Secondary | ICD-10-CM

## 2023-10-14 DIAGNOSIS — R188 Other ascites: Secondary | ICD-10-CM | POA: Diagnosis not present

## 2023-10-14 DIAGNOSIS — K7290 Hepatic failure, unspecified without coma: Secondary | ICD-10-CM | POA: Diagnosis not present

## 2023-10-14 DIAGNOSIS — I2583 Coronary atherosclerosis due to lipid rich plaque: Secondary | ICD-10-CM

## 2023-10-14 DIAGNOSIS — Z96612 Presence of left artificial shoulder joint: Secondary | ICD-10-CM | POA: Diagnosis present

## 2023-10-14 DIAGNOSIS — E875 Hyperkalemia: Secondary | ICD-10-CM | POA: Diagnosis not present

## 2023-10-14 DIAGNOSIS — Z9049 Acquired absence of other specified parts of digestive tract: Secondary | ICD-10-CM

## 2023-10-14 DIAGNOSIS — E871 Hypo-osmolality and hyponatremia: Secondary | ICD-10-CM | POA: Diagnosis present

## 2023-10-14 DIAGNOSIS — K838 Other specified diseases of biliary tract: Secondary | ICD-10-CM | POA: Diagnosis not present

## 2023-10-14 DIAGNOSIS — Z681 Body mass index (BMI) 19 or less, adult: Secondary | ICD-10-CM

## 2023-10-14 DIAGNOSIS — K580 Irritable bowel syndrome with diarrhea: Secondary | ICD-10-CM | POA: Diagnosis present

## 2023-10-14 DIAGNOSIS — G9341 Metabolic encephalopathy: Secondary | ICD-10-CM | POA: Diagnosis not present

## 2023-10-14 DIAGNOSIS — R791 Abnormal coagulation profile: Secondary | ICD-10-CM | POA: Diagnosis present

## 2023-10-14 DIAGNOSIS — Z79899 Other long term (current) drug therapy: Secondary | ICD-10-CM

## 2023-10-14 DIAGNOSIS — E785 Hyperlipidemia, unspecified: Secondary | ICD-10-CM | POA: Diagnosis not present

## 2023-10-14 DIAGNOSIS — R0602 Shortness of breath: Secondary | ICD-10-CM | POA: Diagnosis not present

## 2023-10-14 DIAGNOSIS — I371 Nonrheumatic pulmonary valve insufficiency: Secondary | ICD-10-CM | POA: Diagnosis present

## 2023-10-14 DIAGNOSIS — I442 Atrioventricular block, complete: Secondary | ICD-10-CM | POA: Diagnosis not present

## 2023-10-14 DIAGNOSIS — I081 Rheumatic disorders of both mitral and tricuspid valves: Secondary | ICD-10-CM | POA: Diagnosis not present

## 2023-10-14 DIAGNOSIS — Z951 Presence of aortocoronary bypass graft: Secondary | ICD-10-CM

## 2023-10-14 DIAGNOSIS — I340 Nonrheumatic mitral (valve) insufficiency: Secondary | ICD-10-CM | POA: Diagnosis not present

## 2023-10-14 DIAGNOSIS — Z7984 Long term (current) use of oral hypoglycemic drugs: Secondary | ICD-10-CM

## 2023-10-14 DIAGNOSIS — E8729 Other acidosis: Secondary | ICD-10-CM | POA: Diagnosis not present

## 2023-10-14 DIAGNOSIS — N179 Acute kidney failure, unspecified: Secondary | ICD-10-CM | POA: Diagnosis not present

## 2023-10-14 DIAGNOSIS — R636 Underweight: Secondary | ICD-10-CM | POA: Diagnosis present

## 2023-10-14 DIAGNOSIS — Z87891 Personal history of nicotine dependence: Secondary | ICD-10-CM

## 2023-10-14 DIAGNOSIS — J90 Pleural effusion, not elsewhere classified: Secondary | ICD-10-CM | POA: Diagnosis not present

## 2023-10-14 DIAGNOSIS — Z9079 Acquired absence of other genital organ(s): Secondary | ICD-10-CM

## 2023-10-14 DIAGNOSIS — I361 Nonrheumatic tricuspid (valve) insufficiency: Secondary | ICD-10-CM | POA: Diagnosis not present

## 2023-10-14 LAB — CBC WITH DIFFERENTIAL/PLATELET
Abs Granulocyte: 6.9 K/uL — ABNORMAL HIGH (ref 1.5–6.5)
Abs Immature Granulocytes: 0.05 K/uL (ref 0.00–0.07)
Basophils Absolute: 0 K/uL (ref 0.0–0.1)
Basophils Relative: 0 %
Eosinophils Absolute: 0 K/uL (ref 0.0–0.5)
Eosinophils Relative: 0 %
HCT: 41.4 % (ref 39.0–52.0)
Hemoglobin: 13.4 g/dL (ref 13.0–17.0)
Immature Granulocytes: 1 %
Lymphocytes Relative: 8 %
Lymphs Abs: 0.7 K/uL (ref 0.7–4.0)
MCH: 32.1 pg (ref 26.0–34.0)
MCHC: 32.4 g/dL (ref 30.0–36.0)
MCV: 99 fL (ref 80.0–100.0)
Monocytes Absolute: 0.9 K/uL (ref 0.1–1.0)
Monocytes Relative: 11 %
Neutro Abs: 6.9 K/uL (ref 1.7–7.7)
Neutrophils Relative %: 80 %
Platelets: 128 K/uL — ABNORMAL LOW (ref 150–400)
RBC: 4.18 MIL/uL — ABNORMAL LOW (ref 4.22–5.81)
RDW: 13.8 % (ref 11.5–15.5)
WBC: 8.6 K/uL (ref 4.0–10.5)
nRBC: 0 % (ref 0.0–0.2)

## 2023-10-14 LAB — BASIC METABOLIC PANEL WITH GFR
Anion gap: 15 (ref 5–15)
BUN: 44 mg/dL — ABNORMAL HIGH (ref 8–23)
CO2: 19 mmol/L — ABNORMAL LOW (ref 22–32)
Calcium: 9.1 mg/dL (ref 8.9–10.3)
Chloride: 100 mmol/L (ref 98–111)
Creatinine, Ser: 2.28 mg/dL — ABNORMAL HIGH (ref 0.61–1.24)
GFR, Estimated: 28 mL/min — ABNORMAL LOW (ref >60)
Glucose, Bld: 89 mg/dL (ref 70–99)
Potassium: 5.1 mmol/L (ref 3.5–5.1)
Sodium: 134 mmol/L — ABNORMAL LOW (ref 135–145)

## 2023-10-14 LAB — PROTIME-INR
INR: 4 — ABNORMAL HIGH (ref 0.8–1.2)
Prothrombin Time: 40.7 s — ABNORMAL HIGH (ref 11.4–15.2)

## 2023-10-14 MED ORDER — LACTATED RINGERS IV BOLUS
1000.0000 mL | Freq: Once | INTRAVENOUS | Status: AC
Start: 2023-10-14 — End: 2023-10-14
  Administered 2023-10-14: 1000 mL via INTRAVENOUS

## 2023-10-14 MED ORDER — ONDANSETRON HCL 4 MG/2ML IJ SOLN
4.0000 mg | Freq: Once | INTRAMUSCULAR | Status: AC
  Administered 2023-10-14: 4 mg via INTRAVENOUS
  Filled 2023-10-14: qty 2

## 2023-10-14 NOTE — Telephone Encounter (Signed)
 FYI Only or Action Required?: FYI only for provider.  Patient was last seen in primary care on 09/26/2023 by [name], PA-C.  Called Nurse Triage reporting Rectal Bleeding.  Symptoms began several days ago.  Interventions attempted: Rest, hydration, or home remedies.  Symptoms are: rapidly worsening.  Triage Disposition: Go to ED Now (or PCP Triage)  Patient/caregiver understands and will follow disposition?: Yes        Reason for Disposition  Taking Coumadin  (warfarin) or other strong blood thinner, or known bleeding disorder (e.g., thrombocytopenia)  Answer Assessment - Initial Assessment Questions Recommended patient go to ED for evaluation.  Patient and wife on call. Patient and wife verbalized understanding. Patient takes coumadin  and reports continued episodes of rectal bleeding. Patient reports he wants wife, [name] on the [facility] for future needs.        1. APPEARANCE of BLOOD: What color is it? Is it passed separately, on the surface of the stool, or mixed in with the stool?      Mixed in stool and in toilet and on floor on Saturday , per patient with wife on call as well. Dark red and reports bleeding today  2. AMOUNT: How much blood was passed?      Unsure  3. FREQUENCY: How many times has blood been passed with the stools?      Every day since Saturday  4. ONSET: When was the blood first seen in the stools? (Days or weeks)      Saturday  5. DIARRHEA: Is there also some diarrhea? If Yes, ask: How many diarrhea stools in the past 24 hours?      Straining to have BM and then had loose stool with bleeding  6. CONSTIPATION: Do you have constipation? If Yes, ask: How bad is it?     na 7. RECURRENT SYMPTOMS: Have you had blood in your stools before? If Yes, ask: When was the last time? and What happened that time?      Yes Saturday  8. BLOOD THINNERS: Do you take any blood thinners? (e.g., aspirin , clopidogrel / Plavix, coumadin ,  heparin ). Notes: Other strong blood thinners include: Arixtra (fondaparinux), Eliquis (apixaban), Pradaxa (dabigatran), and Xarelto  (rivaroxaban ).     coumadin  9. OTHER SYMPTOMS: Do you have any other symptoms?  (e.g., abdomen pain, vomiting, dizziness, fever)     Dizziness at times over weekend but not now. Not eating or drinking per wife report since Saturday. Sleeping all day , approx 15-16 hours during the day per wife.  10. PREGNANCY: Is there any chance you are pregnant? When was your last menstrual period?       na  Protocols used: Rectal Bleeding-A-AH

## 2023-10-14 NOTE — Telephone Encounter (Signed)
 FYI Only or Action Required?: Action required by provider: please have PCP nurse call pt.  Patient was last seen in primary care on 09/26/2023 by [name], PA-C.    Copied from CRM #[id number]. Topic: Clinical - Red Word Triage >> Oct 14, 2023 12:34 PM Graeme ORN wrote: Red Word that prompted transfer to Nurse Triage: bl;eeding out of rectum shortness of breath   Grayce (person that set up appt for [facility]) - called to report some information about patient.  Robin transfer the husband's wife to NT: attempted to verify information and wife not on DPR: informed wife I could listen & make appt but I could not share information.  Wife became very upset stating she is the one we all call when they need anything with patient & now I can't share information.  Pt just stated have her PCP nurse call her.   Unable to perform triage with wife.  PCP office please call pt/wife.

## 2023-10-14 NOTE — Telephone Encounter (Signed)
 Called pt, wife states he is sleeping. Advised her we are returning call and asked her to have him  call us  back ( Pt has no one listed on DPR)

## 2023-10-14 NOTE — Telephone Encounter (Signed)
  This encounter was created in error - please disregard Please see NT encounter from today

## 2023-10-14 NOTE — ED Triage Notes (Signed)
 Patient's family endorses blood in toilet and on floor, patient is sleeping more and is eating less. Patient is on coumadin  as well.

## 2023-10-14 NOTE — ED Notes (Signed)
 Pt transferred to room 4. Report received from [facility].  Attempted to place patient on the monitor but patient immediately pulls everything off, says I'm fine, I don't need that & turns over to go to sleep. Call bell within reach. Urinal at bedside. Breathing easy/unlabored.

## 2023-10-14 NOTE — ED Triage Notes (Signed)
 Pt sent in by Dr. Bulah for rectal bleeding since Saturday.  Family states that there was blood in the toilet.  Endorses weakness and SOB. Pt is on Coumadin . SABRA

## 2023-10-14 NOTE — ED Provider Triage Note (Signed)
 Emergency Medicine Provider Triage Evaluation Note  Jason Day , a 83 y.o. male  was evaluated in triage.  Pt complains of rectal bleeding. Report persistent rectal bleeding x 3 days.  Now endorse some weakness.  No abd pain, no rectal pain.  Currently on coumadin  for hx of afib.  GI dr.cunningham  Review of Systems  Positive: As above Negative: As above  Physical Exam  BP 112/78 (BP Location: Left Arm)   Pulse 71   Temp 98 F (36.7 C)   Resp 16   Ht 6' 1 (1.854 m)   Wt 59.4 kg   SpO2 93%   BMI 17.28 kg/m  Gen:   Awake, no distress   Resp:  Normal effort  MSK:   Moves extremities without difficulty  Other:    Medical Decision Making  Medically screening exam initiated at 4:18 PM.  Appropriate orders placed.  Jason Day was informed that the remainder of the evaluation will be completed by another provider, this initial triage assessment does not replace that evaluation, and the importance of remaining in the ED until their evaluation is complete.     Nivia Colon, PA-C 10/14/23 [phone]

## 2023-10-14 NOTE — ED Notes (Signed)
 Pt refusing to keep monitor equipment on. States he does not want to hear it buzzing

## 2023-10-14 NOTE — ED Provider Notes (Signed)
 [place] EMERGENCY DEPARTMENT AT [facility] Provider Note   CSN: [id number] Arrival date & time: 10/14/23  1503     Patient presents with: Rectal Bleeding and Shortness of Breath   Jason Day is a 83 y.o. male.   Patient complains of rectal bleeding.  He is an 83 year old male who presents with his wife.  He has history of irritable bowel syndrome and excessive gas.  He follows with gastroenterology.  For the past 3 days now he is having frank largely bloody stools.  Episodes are occurring about every hour.  No shortness of breath, chest pain, lightheadedness that is new.  He is currently on Coumadin .  Follows with Dr. Stacia of [facility].  Denies any abdominal pain.  History of the same over the past 6 years.  Most recent flareup prior to this episode was around [facility] 2024.  The history is provided by the patient. No language interpreter was used.       Prior to Admission medications   Medication Sig Start Date End Date Taking? Authorizing Provider  desvenlafaxine  (PRISTIQ ) 50 MG 24 hr tablet Take 1 tablet (50 mg total) by mouth daily. 09/27/23   Tysinger, Alm RAMAN, PA-C  amitriptyline  (ELAVIL ) 25 MG tablet Take 2 tablets (50 mg total) by mouth at bedtime. 07/23/23   Stacia Glendia BRAVO, MD  atorvastatin  (LIPITOR) 10 MG tablet Take 1 tablet (10 mg total) by mouth daily. 10/01/23   Nishan, Peter C, MD  dapagliflozin  propanediol (FARXIGA ) 10 MG TABS tablet Take 1 tablet (10 mg total) by mouth daily before breakfast. 01/31/23   Rolan Ezra RAMAN, MD  metoprolol  succinate (TOPROL -XL) 25 MG 24 hr tablet Take 0.5 tablets (12.5 mg total) by mouth daily. Take 12.5mg  daily 06/25/23   Nishan, Peter C, MD  spironolactone  (ALDACTONE ) 25 MG tablet Take 0.5 tablets (12.5 mg total) by mouth daily. 09/12/23   Rolan Ezra RAMAN, MD  valsartan  (DIOVAN ) 40 MG tablet Take 0.5 tablets (20 mg total) by mouth daily. 09/24/23   Rolan Ezra RAMAN, MD  Vitamin D , Ergocalciferol , (DRISDOL ) 1.25 MG  (50000 UNIT) CAPS capsule Take 1 capsule (50,000 Units total) by mouth every 7 (seven) days. 07/31/23   Stacia Glendia BRAVO, MD  warfarin (COUMADIN ) 5 MG tablet Take 1-1.5 tablets (5-7.5 mg total) by mouth daily as directed by coumadin  clinic. 08/12/23   Delford Maude BROCKS, MD    Allergies: Patient has no known allergies.    Review of Systems  Constitutional:  Negative for chills and fever.  Respiratory:  Negative for shortness of breath.   Cardiovascular:  Negative for chest pain.  Gastrointestinal:  Positive for blood in stool. Negative for nausea and vomiting.  Neurological:  Negative for light-headedness.  All other systems reviewed and are negative.   Updated Vital Signs BP 113/74   Pulse 92   Temp 98 F (36.7 C)   Resp (!) 28   Ht 6' 1 (1.854 m)   Wt 59.4 kg   SpO2 96%   BMI 17.28 kg/m   Physical Exam Vitals and nursing note reviewed.  Constitutional:      General: He is not in acute distress.    Appearance: Normal appearance. He is not ill-appearing.  HENT:     Head: Normocephalic and atraumatic.     Nose: Nose normal.  Eyes:     Conjunctiva/sclera: Conjunctivae normal.  Pulmonary:     Effort: Pulmonary effort is normal. No respiratory distress.  Musculoskeletal:  General: No deformity.  Skin:    Findings: No rash.  Neurological:     Mental Status: He is alert.     (all labs ordered are listed, but only abnormal results are displayed) Labs Reviewed  CBC WITH DIFFERENTIAL/PLATELET - Abnormal; Notable for the following components:      Result Value   RBC 4.18 (*)    Platelets 128 (*)    Abs Granulocyte 6.9 (*)    All other components within normal limits  BASIC METABOLIC PANEL WITH GFR - Abnormal; Notable for the following components:   Sodium 134 (*)    CO2 19 (*)    BUN 44 (*)    Creatinine, Ser 2.28 (*)    GFR, Estimated 28 (*)    All other components within normal limits  PROTIME-INR - Abnormal; Notable for the following components:    Prothrombin Time 40.7 (*)    INR 4.0 (*)    All other components within normal limits  POC OCCULT BLOOD, ED  TYPE AND SCREEN    EKG: None  Radiology: No results found.   Procedures   Medications Ordered in the ED - No data to display                                  Medical Decision Making Risk Prescription drug management. Decision regarding hospitalization.   Medical Decision Making / ED Course   This patient presents to the ED for concern of rectal bleeding, this involves an extensive number of treatment options, and is a complaint that carries with it a high risk of complications and morbidity.  The differential diagnosis includes upper GI bleed, lower GI bleed  MDM: 83 year old male presents with concern for rectal bleeding for the past 3 days.  Has had significant episodes in his frank blood that covers the toilet bowl.  Has history of this.  Also unable to eat or drink any significant amount over the past several days.  Dynamically stable.  Hemoglobin is normal.  No concern for acute blood loss anemia.  I spoke with Dr. Albertus with gastroenterology.  He recommends holding warfarin and admitted to medicine and they will consult.  If he becomes unstable he will likely need his Coumadin  reversed.  Do not recommend obtaining any angio study given his kidney function. Fluids given.  Will discuss with hospitalist.  Spoke with Dr. Lenard of hospitalist service.  He will evaluate patient for admission.   Additional history obtained: -Additional history obtained from spouse at bedside who provides most of the history given patient's dementia. -External records from outside source obtained and reviewed including: Chart review including previous notes, labs, imaging, consultation notes   Lab Tests: -I ordered, reviewed, and interpreted labs.   The pertinent results include:   Labs Reviewed  CBC WITH DIFFERENTIAL/PLATELET - Abnormal; Notable for the following  components:      Result Value   RBC 4.18 (*)    Platelets 128 (*)    Abs Granulocyte 6.9 (*)    All other components within normal limits  BASIC METABOLIC PANEL WITH GFR - Abnormal; Notable for the following components:   Sodium 134 (*)    CO2 19 (*)    BUN 44 (*)    Creatinine, Ser 2.28 (*)    GFR, Estimated 28 (*)    All other components within normal limits  PROTIME-INR - Abnormal; Notable for the following components:  Prothrombin Time 40.7 (*)    INR 4.0 (*)    All other components within normal limits  POC OCCULT BLOOD, ED  TYPE AND SCREEN      EKG  EKG Interpretation Date/Time:    Ventricular Rate:    PR Interval:    QRS Duration:    QT Interval:    QTC Calculation:   R Axis:      Text Interpretation:          Medicines ordered and prescription drug management: No orders of the defined types were placed in this encounter.   -I have reviewed the patients home medicines and have made adjustments as needed  Consultations Obtained: I requested consultation with the Dr. Albertus with [facility],  and discussed lab and imaging findings as well as pertinent plan - they recommend: As above   Social Determinants of Health:  Factors impacting patients care include: Good family support   Reevaluation: After the interventions noted above, I reevaluated the patient and found that they have :stayed the same  Co morbidities that complicate the patient evaluation  Past Medical History:  Diagnosis Date   Acute combined systolic and diastolic congestive heart failure (HCC) 10/16/2016   Allergic rhinitis 09/01/2020   Anal fissure    Angio-edema    Bleeding hemorrhoids    Chronic combined systolic and diastolic heart failure (HCC)    Chronic sinus complaints    overuses afrin   Coronary atherosclerosis of native coronary artery    Dyspnea    Encounter for medical examination to establish care 04/22/2017   Gallstones    Hyperlipidemia    Irritable bowel syndrome  06/07/2017   Ischemic cardiomyopathy    Left atrial enlargement    Long term (current) use of anticoagulants 12/30/2020   Moderate mitral regurgitation    Myocardial infarction (HCC) 09/2015   Persistent atrial fibrillation (HCC)    RBBB    A- Fib   Shortness of breath 12/29/2020   Urinary incontinence 12/30/2020      Dispostion: Will discuss with hospitalist for admission They will evaluate patient for admission.  Final diagnoses:  Rectal bleeding    ED Discharge Orders     None          Hildegard Loge, PA-C 10/14/23 2309    Tegeler, Lonni PARAS, MD 10/14/23 2329

## 2023-10-15 ENCOUNTER — Inpatient Hospital Stay (HOSPITAL_COMMUNITY)

## 2023-10-15 ENCOUNTER — Telehealth: Payer: Self-pay | Admitting: Internal Medicine

## 2023-10-15 ENCOUNTER — Telehealth: Payer: Self-pay

## 2023-10-15 DIAGNOSIS — I6782 Cerebral ischemia: Secondary | ICD-10-CM | POA: Diagnosis not present

## 2023-10-15 DIAGNOSIS — K649 Unspecified hemorrhoids: Secondary | ICD-10-CM | POA: Diagnosis not present

## 2023-10-15 DIAGNOSIS — N179 Acute kidney failure, unspecified: Secondary | ICD-10-CM | POA: Diagnosis not present

## 2023-10-15 DIAGNOSIS — E8729 Other acidosis: Secondary | ICD-10-CM | POA: Diagnosis not present

## 2023-10-15 DIAGNOSIS — R791 Abnormal coagulation profile: Secondary | ICD-10-CM | POA: Diagnosis not present

## 2023-10-15 DIAGNOSIS — E875 Hyperkalemia: Secondary | ICD-10-CM

## 2023-10-15 DIAGNOSIS — K625 Hemorrhage of anus and rectum: Secondary | ICD-10-CM | POA: Diagnosis not present

## 2023-10-15 DIAGNOSIS — N189 Chronic kidney disease, unspecified: Secondary | ICD-10-CM

## 2023-10-15 DIAGNOSIS — R4182 Altered mental status, unspecified: Secondary | ICD-10-CM | POA: Diagnosis not present

## 2023-10-15 LAB — BASIC METABOLIC PANEL WITH GFR
Anion gap: 19 — ABNORMAL HIGH (ref 5–15)
Anion gap: 17 — ABNORMAL HIGH (ref 5–15)
BUN: 53 mg/dL — ABNORMAL HIGH (ref 8–23)
BUN: 53 mg/dL — ABNORMAL HIGH (ref 8–23)
CO2: 14 mmol/L — ABNORMAL LOW (ref 22–32)
CO2: 18 mmol/L — ABNORMAL LOW (ref 22–32)
Calcium: 9 mg/dL (ref 8.9–10.3)
Calcium: 8.5 mg/dL — ABNORMAL LOW (ref 8.9–10.3)
Chloride: 96 mmol/L — ABNORMAL LOW (ref 98–111)
Chloride: 93 mmol/L — ABNORMAL LOW (ref 98–111)
Creatinine, Ser: 2.69 mg/dL — ABNORMAL HIGH (ref 0.61–1.24)
Creatinine, Ser: 2.65 mg/dL — ABNORMAL HIGH (ref 0.61–1.24)
GFR, Estimated: 23 mL/min — ABNORMAL LOW (ref >60)
GFR, Estimated: 23 mL/min — ABNORMAL LOW (ref >60)
Glucose, Bld: 79 mg/dL (ref 70–99)
Glucose, Bld: 146 mg/dL — ABNORMAL HIGH (ref 70–99)
Potassium: 6.3 mmol/L (ref 3.5–5.1)
Potassium: 4.9 mmol/L (ref 3.5–5.1)
Sodium: 129 mmol/L — ABNORMAL LOW (ref 135–145)
Sodium: 128 mmol/L — ABNORMAL LOW (ref 135–145)

## 2023-10-15 LAB — CBC
HCT: 39.4 % (ref 39.0–52.0)
HCT: 34.6 % — ABNORMAL LOW (ref 39.0–52.0)
Hemoglobin: 12.7 g/dL — ABNORMAL LOW (ref 13.0–17.0)
Hemoglobin: 11.4 g/dL — ABNORMAL LOW (ref 13.0–17.0)
MCH: 32 pg (ref 26.0–34.0)
MCH: 32.3 pg (ref 26.0–34.0)
MCHC: 32.2 g/dL (ref 30.0–36.0)
MCHC: 32.9 g/dL (ref 30.0–36.0)
MCV: 99.2 fL (ref 80.0–100.0)
MCV: 98 fL (ref 80.0–100.0)
Platelets: 98 K/uL — ABNORMAL LOW (ref 150–400)
Platelets: 72 K/uL — ABNORMAL LOW (ref 150–400)
RBC: 3.97 MIL/uL — ABNORMAL LOW (ref 4.22–5.81)
RBC: 3.53 MIL/uL — ABNORMAL LOW (ref 4.22–5.81)
RDW: 13.9 % (ref 11.5–15.5)
RDW: 13.9 % (ref 11.5–15.5)
WBC: 11.7 K/uL — ABNORMAL HIGH (ref 4.0–10.5)
WBC: 10.2 K/uL (ref 4.0–10.5)
nRBC: 0 % (ref 0.0–0.2)
nRBC: 0 % (ref 0.0–0.2)

## 2023-10-15 LAB — BRAIN NATRIURETIC PEPTIDE: B Natriuretic Peptide: 894.2 pg/mL — ABNORMAL HIGH (ref 0.0–100.0)

## 2023-10-15 LAB — LACTIC ACID, PLASMA
Lactic Acid, Venous: 4.8 mmol/L (ref 0.5–1.9)
Lactic Acid, Venous: 3.4 mmol/L (ref 0.5–1.9)
Lactic Acid, Venous: 3.4 mmol/L (ref 0.5–1.9)

## 2023-10-15 LAB — TYPE AND SCREEN
ABO/RH(D): O NEG
Antibody Screen: NEGATIVE

## 2023-10-15 LAB — MAGNESIUM: Magnesium: 2.5 mg/dL — ABNORMAL HIGH (ref 1.7–2.4)

## 2023-10-15 LAB — PROTIME-INR
INR: 6.7 (ref 0.8–1.2)
Prothrombin Time: 61.3 s — ABNORMAL HIGH (ref 11.4–15.2)

## 2023-10-15 LAB — CK: Total CK: 245 U/L (ref 49–397)

## 2023-10-15 LAB — CBG MONITORING, ED: Glucose-Capillary: 88 mg/dL (ref 70–99)

## 2023-10-15 MED ORDER — ACETAMINOPHEN 650 MG RE SUPP: 650.0000 mg | Freq: Four times a day (QID) | RECTAL | Status: DC | PRN

## 2023-10-15 MED ORDER — METOPROLOL SUCCINATE ER 25 MG PO TB24
12.5000 mg | ORAL_TABLET | Freq: Every day | ORAL | Status: DC
  Administered 2023-10-15 – 2023-10-17 (×3): 12.5 mg via ORAL
  Filled 2023-10-15 (×3): qty 1

## 2023-10-15 MED ORDER — PHYTONADIONE 5 MG PO TABS
2.5000 mg | ORAL_TABLET | Freq: Once | ORAL | Status: AC
  Administered 2023-10-15: 2.5 mg via ORAL
  Filled 2023-10-15: qty 1

## 2023-10-15 MED ORDER — PROCHLORPERAZINE EDISYLATE 10 MG/2ML IJ SOLN: 5.0000 mg | Freq: Four times a day (QID) | INTRAMUSCULAR | Status: DC | PRN

## 2023-10-15 MED ORDER — ATORVASTATIN CALCIUM 10 MG PO TABS
10.0000 mg | ORAL_TABLET | Freq: Every day | ORAL | Status: DC
  Administered 2023-10-15 – 2023-10-16 (×2): 10 mg via ORAL
  Filled 2023-10-15 (×2): qty 1

## 2023-10-15 MED ORDER — LACTATED RINGERS IV BOLUS
250.0000 mL | Freq: Once | INTRAVENOUS | Status: AC
  Administered 2023-10-15: 250 mL via INTRAVENOUS

## 2023-10-15 MED ORDER — SODIUM CHLORIDE 0.9% FLUSH
3.0000 mL | Freq: Two times a day (BID) | INTRAVENOUS | Status: DC
  Administered 2023-10-15 – 2023-10-20 (×10): 3 mL via INTRAVENOUS

## 2023-10-15 MED ORDER — INSULIN ASPART 100 UNIT/ML IV SOLN
5.0000 [IU] | Freq: Once | INTRAVENOUS | Status: AC
  Administered 2023-10-15: 5 [IU] via INTRAVENOUS

## 2023-10-15 MED ORDER — SODIUM CHLORIDE 0.9 % IV SOLN: INTRAVENOUS | Status: DC

## 2023-10-15 MED ORDER — AMITRIPTYLINE HCL 50 MG PO TABS
25.0000 mg | ORAL_TABLET | Freq: Every day | ORAL | Status: DC
  Administered 2023-10-15: 25 mg via ORAL
  Filled 2023-10-15: qty 1

## 2023-10-15 MED ORDER — VENLAFAXINE HCL ER 37.5 MG PO CP24
37.5000 mg | ORAL_CAPSULE | Freq: Every day | ORAL | Status: DC
  Administered 2023-10-15 – 2023-10-20 (×5): 37.5 mg via ORAL
  Filled 2023-10-15 (×9): qty 1

## 2023-10-15 MED ORDER — HYDROCORTISONE ACETATE 25 MG RE SUPP
25.0000 mg | Freq: Two times a day (BID) | RECTAL | Status: AC
  Administered 2023-10-15 – 2023-10-16 (×2): 25 mg via RECTAL
  Filled 2023-10-15 (×2): qty 1

## 2023-10-15 MED ORDER — SODIUM CHLORIDE 0.9 % IV BOLUS
500.0000 mL | Freq: Once | INTRAVENOUS | Status: AC
  Administered 2023-10-15: 500 mL via INTRAVENOUS

## 2023-10-15 MED ORDER — AMITRIPTYLINE HCL 50 MG PO TABS
50.0000 mg | ORAL_TABLET | Freq: Every day | ORAL | Status: DC
  Administered 2023-10-15: 50 mg via ORAL
  Filled 2023-10-15: qty 2

## 2023-10-15 MED ORDER — ACETAMINOPHEN 325 MG PO TABS: 650.0000 mg | ORAL_TABLET | Freq: Four times a day (QID) | ORAL | Status: DC | PRN

## 2023-10-15 MED ORDER — HALOPERIDOL LACTATE 5 MG/ML IJ SOLN: 1.0000 mg | Freq: Two times a day (BID) | INTRAMUSCULAR | Status: DC | PRN

## 2023-10-15 MED ORDER — HALOPERIDOL LACTATE 5 MG/ML IJ SOLN: 3.0000 mg | Freq: Once | INTRAMUSCULAR | Status: DC | PRN

## 2023-10-15 MED ORDER — DEXTROSE 50 % IV SOLN
1.0000 | Freq: Once | INTRAVENOUS | Status: AC
  Administered 2023-10-15: 50 mL via INTRAVENOUS
  Filled 2023-10-15: qty 50

## 2023-10-15 MED ORDER — SODIUM BICARBONATE 8.4 % IV SOLN
50.0000 meq | Freq: Once | INTRAVENOUS | Status: AC
  Administered 2023-10-15: 50 meq via INTRAVENOUS
  Filled 2023-10-15: qty 50

## 2023-10-15 MED ORDER — BOOST PLUS PO LIQD
237.0000 mL | Freq: Three times a day (TID) | ORAL | Status: DC
  Administered 2023-10-15 – 2023-10-20 (×7): 237 mL via ORAL
  Filled 2023-10-15 (×19): qty 237

## 2023-10-15 MED ORDER — SODIUM ZIRCONIUM CYCLOSILICATE 10 G PO PACK
10.0000 g | PACK | Freq: Once | ORAL | Status: AC
  Administered 2023-10-15: 10 g via ORAL
  Filled 2023-10-15: qty 1

## 2023-10-15 MED ORDER — CALCIUM GLUCONATE-NACL 1-0.675 GM/50ML-% IV SOLN
1.0000 g | Freq: Once | INTRAVENOUS | Status: AC
  Administered 2023-10-15: 1000 mg via INTRAVENOUS
  Filled 2023-10-15: qty 50

## 2023-10-15 NOTE — ED Notes (Signed)
 Pt resting, eyes closed. Breathing/unlabored. Bed alarm remains in place.

## 2023-10-15 NOTE — Telephone Encounter (Signed)
 FYI- patient is in Hospital  Copied from CRM [id number]. Topic: Clinical - Medical Advice >> Oct 15, 2023  1:06 PM Jason Day wrote: [phone] Call Jason Day wife back her husband is currently in the hospital. Jason Day is slurring with his speech.

## 2023-10-15 NOTE — ED Notes (Signed)
 Attempted vitals on patient but adamantly refusing. Swinging at me when I try. Breathing about 16 times/minute. Pt demanding the side rail be lowered but educated on safety. No evidence of learning. Bed in low lying/locked position.   Pt presently resting. Call bell within reach.

## 2023-10-15 NOTE — ED Notes (Signed)
 CCMD is monitoring this pt.

## 2023-10-15 NOTE — ED Notes (Signed)
 Pt found wandering the halls. Unsteady gait. Assisted back to the room. Bed alarm placed.

## 2023-10-15 NOTE — Telephone Encounter (Signed)
 Got it, we were called about him and saw him. Thanks

## 2023-10-15 NOTE — Consult Note (Addendum)
 Consultation  Referring Provider:TRH/ Hongalgi Primary Care Physician:  [name], PA-C Primary Gastroenterologist:  Dr.Cunningham  Reason for Consultation: Rectal bleeding in setting of supratherapeutic INR  HPI: Jason Day is a 83 y.o. male with multiple serious comorbidities including coronary artery disease status post CABG, ischemic cardiomyopathy and permanent atrial fibrillation.  Echo in January 2024 had shown EF of 25 to 30%, and severely decreased right ventricular systolic function moderate mitral regurgitation.  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  Echo at that time showed EF of 45 to 50% and right ventricle low normal function.  Also with history of dementia, and has been undergoing GI evaluation with Dr. Stacia due to complaints of significant gradual weight loss over the past 6 years down from 240 pounds to 140 pounds.  He has diagnosis of IBS.  His wife today describes episodes of normal bowel movements followed by episodes of very frequent bowel movements.  It sounds as to go back to the bowel he had They have tried several medications including colestipol , supplements without benefit.  He was given a course of rifaximin  with no benefit.  He was started on low-dose Elavil  December 2024.  His wife reports that he had done well with eating 6 meals per day of very small meals.  She says if he eats a larger meal then he will not have an appetite for the rest of the day and seems to have more bowel issues during those days. Wife relates that he can sleep for 17 to 18 hours/day, over the past couple of weeks he has had an increase in sleepiness/somnolence she says sometimes only seems to be awake for couple hours per day. He was brought to the hospital yesterday due to concerns with fatigue, and rectal bleeding.  He had onset of rectal bleeding this past weekend with what sounds like 2 or 3  episodes on Sunday and into yesterday.  Wife did not visualize the bowel movements but says that there seem to be a lot of dark old blood in the commode. Patient has no complaints of abdominal pain or rectal discomfort.  No dysphagia or odynophagia. 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  He last had endoscopic evaluation in 2021 per Dr. Lennard with EGD at that time showing some diffuse moderate gastritis otherwise negative study.  Colonoscopy at that time showed medium sized nonbleeding internal hemorrhoids, no polyps or diverticulosis found.  Labs in the ER on arrival last evening show WBC of 8.6/hemoglobin 13.4/hematocrit 41.9 BUN 44/creatinine 2.28 up from his baseline INR 4.0  Today sodium 129/potassium 6.3/BUN 53/creatinine 2.69 WBC 11.7/hemoglobin 12.7/hematocrit 39.4 stable platelets down to 98. Pro time 61.3 INR 6.7 Lactate 4.8 Magnesium  2.5  Labs this p.m. hemoglobin 11.4/hematocrit 34.6/platelets 72  CT head done and pending.   Past Medical History:  Diagnosis Date   Acute combined systolic and diastolic congestive heart failure (HCC) 10/16/2016   Allergic rhinitis 09/01/2020   Anal fissure    Angio-edema    Bleeding hemorrhoids    Chronic combined systolic and diastolic heart failure (HCC)    Chronic sinus complaints    overuses afrin   Coronary atherosclerosis of native coronary artery    Dyspnea    Encounter for medical examination to establish care 04/22/2017  Gallstones    Hyperlipidemia    Irritable bowel syndrome 06/07/2017   Ischemic cardiomyopathy    Left atrial enlargement    Long term (current) use of anticoagulants 12/30/2020   Moderate mitral regurgitation    Myocardial infarction (HCC) 09/2015   Persistent atrial fibrillation (HCC)    RBBB    A- Fib   Shortness of breath  12/29/2020   Urinary incontinence 12/30/2020    Past Surgical History:  Procedure Laterality Date   BACK SURGERY  ~2014   disc repair   CARDIOVERSION N/A 10/24/2018   Procedure: CARDIOVERSION;  Surgeon: Jeffrie Oneil BROCKS, MD;  Location: [facility] ENDOSCOPY;  Service: Cardiovascular;  Laterality: N/A;   CHOLECYSTECTOMY     COLONOSCOPY  04/06/2014   Hemorrhoids and diverticulosis performed in [place]    COLONOSCOPY WITH PROPOFOL  N/A 08/04/2019   Procedure: COLONOSCOPY WITH PROPOFOL ;  Surgeon: Lennard Lesta FALCON, MD;  Location: [facility] ENDOSCOPY;  Service: Endoscopy;  Laterality: N/A;   CORONARY ARTERY BYPASS GRAFT N/A 10/18/2016   Procedure: CORONARY ARTERY BYPASS GRAFTING (CABG) x five , using left internal mammary artery and right leg greater saphenous vein harvested endoscopically;  Surgeon: Lucas Dorise POUR, MD;  Location: [facility] OR;  Service: Open Heart Surgery;  Laterality: N/A;   ESOPHAGOGASTRODUODENOSCOPY (EGD) WITH PROPOFOL  N/A 08/04/2019   Procedure: ESOPHAGOGASTRODUODENOSCOPY (EGD) WITH PROPOFOL ;  Surgeon: Lennard Lesta FALCON, MD;  Location: [facility] ENDOSCOPY;  Service: Endoscopy;  Laterality: N/A;   FOOT SURGERY     HEMORRHOID BANDING     HEMORRHOID SURGERY     PACEMAKER IMPLANT N/A 04/02/2023   Procedure: PACEMAKER IMPLANT;  Surgeon: Cindie Ole DASEN, MD;  Location: [facility] INVASIVE CV LAB;  Service: Cardiovascular;  Laterality: N/A;   REVERSE SHOULDER ARTHROPLASTY Left 08/21/2018   Procedure: REVERSE SHOULDER ARTHROPLASTY;  Surgeon: Melita Drivers, MD;  Location: [facility] ORS;  Service: Orthopedics;  Laterality: Left;   RIGHT/LEFT HEART CATH AND CORONARY ANGIOGRAPHY N/A 10/16/2016   Procedure: RIGHT/LEFT HEART CATH AND CORONARY ANGIOGRAPHY;  Surgeon: Claudene Victory ORN, MD;  Location: [facility] INVASIVE CV LAB;  Service: Cardiovascular;  Laterality: N/A;   stents in liver     TEE WITHOUT CARDIOVERSION N/A 10/18/2016   Procedure: TRANSESOPHAGEAL ECHOCARDIOGRAM (TEE);  Surgeon: Lucas Dorise POUR, MD;  Location: [facility] OR;  Service: Open Heart  Surgery;  Laterality: N/A;   TRANSURETHRAL RESECTION OF PROSTATE N/A 11/19/2019   Procedure: CYSTOSCOPY TRANSURETHRAL RESECTION OF THE PROSTATE (TURP);  Surgeon: Watt Rush, MD;  Location: [facility] ORS;  Service: Urology;  Laterality: N/A;    Prior to Admission medications   Medication Sig Start Date End Date Taking? Authorizing Provider  amitriptyline  (ELAVIL ) 25 MG tablet Take 2 tablets (50 mg total) by mouth at bedtime. Patient taking differently: Take 25 mg by mouth at bedtime. 07/23/23  Yes Stacia Glendia BRAVO, MD  atorvastatin  (LIPITOR) 10 MG tablet Take 1 tablet (10 mg total) by mouth daily. Patient taking differently: Take 10 mg by mouth at bedtime. 10/01/23  Yes Delford Maude BROCKS, MD  dapagliflozin  propanediol (FARXIGA ) 10 MG TABS tablet Take 1 tablet (10 mg total) by mouth daily before breakfast. 01/31/23  Yes McLean, Dalton S, MD  desvenlafaxine  (PRISTIQ ) 50 MG 24 hr tablet Take 1 tablet (50 mg total) by mouth daily. 09/27/23  Yes Tysinger, Alm RAMAN, PA-C  metoprolol  succinate (TOPROL -XL) 25 MG 24 hr tablet Take 0.5 tablets (12.5 mg total) by mouth daily. Take 12.5mg  daily 06/25/23  Yes Nishan, Peter C, MD  Vitamin D , Ergocalciferol , (DRISDOL ) 1.25 MG (50000 UNIT) CAPS capsule  Take 1 capsule (50,000 Units total) by mouth every 7 (seven) days. 07/31/23  Yes Stacia Glendia BRAVO, MD  warfarin (COUMADIN ) 5 MG tablet Take 1-1.5 tablets (5-7.5 mg total) by mouth daily as directed by coumadin  clinic. Patient taking differently: Take 5 mg by mouth daily. 08/12/23  Yes Delford Maude BROCKS, MD  spironolactone  (ALDACTONE ) 25 MG tablet Take 12.5 mg by mouth daily. Patient not taking: Reported on 10/15/2023    [provider]  valsartan  (DIOVAN ) 40 MG tablet Take 0.5 tablets (20 mg total) by mouth daily. Patient not taking: Reported on 10/15/2023 09/24/23   Rolan Ezra RAMAN, MD    Current Facility-Administered Medications  Medication Dose Route Frequency Provider Last Rate Last Admin   0.9 %  sodium chloride   infusion   Intravenous Continuous Hongalgi, Anand D, MD 100 mL/hr at 10/15/23 1515 Restarted at 10/15/23 1515   acetaminophen  (TYLENOL ) tablet 650 mg  650 mg Oral Q6H PRN Opyd, Evalene RAMAN, MD       Or   acetaminophen  (TYLENOL ) suppository 650 mg  650 mg Rectal Q6H PRN Opyd, Evalene RAMAN, MD       amitriptyline  (ELAVIL ) tablet 25 mg  25 mg Oral QHS Jadence Kinlaw, Elspeth SQUIBB, MD       atorvastatin  (LIPITOR) tablet 10 mg  10 mg Oral Daily Opyd, Timothy S, MD   10 mg at 10/15/23 9044   haloperidol  lactate (HALDOL ) injection 1 mg  1 mg Intravenous Q12H PRN Hongalgi, Anand D, MD       hydrocortisone  (ANUSOL -HC) suppository 25 mg  25 mg Rectal BID Esterwood, Amy S, PA-C       lactose free nutrition (BOOST PLUS) liquid 237 mL  237 mL Oral TID WC Esterwood, Amy S, PA-C       metoprolol  succinate (TOPROL -XL) 24 hr tablet 12.5 mg  12.5 mg Oral Daily Opyd, Timothy S, MD   12.5 mg at 10/15/23 9044   prochlorperazine  (COMPAZINE ) injection 5 mg  5 mg Intravenous Q6H PRN Opyd, Evalene RAMAN, MD       sodium chloride  flush (NS) 0.9 % injection 3 mL  3 mL Intravenous Q12H Opyd, Evalene RAMAN, MD   3 mL at 10/15/23 9041   venlafaxine  XR (EFFEXOR -XR) 24 hr capsule 37.5 mg  37.5 mg Oral Q breakfast Opyd, Evalene RAMAN, MD   37.5 mg at 10/15/23 9044    Allergies as of 10/14/2023   (No Known Allergies)    Family History  Problem Relation Age of Onset   COPD Father    Emphysema Father    Healthy Brother    Diabetes Neg Hx    Cancer Neg Hx    Stomach cancer Neg Hx    Colon cancer Neg Hx    Allergic rhinitis Neg Hx    Angioedema Neg Hx    Asthma Neg Hx    Eczema Neg Hx    Urticaria Neg Hx     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  Tobacco Use   Smoking status: Former    Current packs/day:  0.00    Types: Cigarettes    Quit date: 1970    Years since quitting: 55.6    Passive exposure: Never   Smokeless tobacco: Never  Tobacco comments:    Quit in 1970  Vaping Use   Vaping status: Never Used  Substance and Sexual Activity   Alcohol  use: No   Drug use: No   Sexual activity: Yes  Other Topics Concern   Not on file  Social History Narrative   Married    Originally from [place]  moved to [place]  age 41 moved to [facility] 2018   1 son Tywan Siever in [place]   [address]dtr [place]       Has living will   Wife is health care POA--then son   Would accept resuscitation   No tube feeds if cognitively unaware   Social Drivers of Health   Financial Resource Strain: Low Risk  (12/04/2022)   Overall Financial Resource Strain (CARDIA)    Difficulty of Paying Living Expenses: Not hard at all  Food Insecurity: No Food Insecurity (10/15/2023)   Hunger Vital Sign    Worried About Running Out of Food in the Last Year: Never true    Ran Out of Food in the Last Year: Never true  Transportation Needs: No Transportation Needs (10/15/2023)   PRAPARE - Administrator, Civil Service (Medical): No    Lack of Transportation (Non-Medical): No  Physical Activity: Insufficiently Active (12/04/2022)   Exercise Vital Sign    Days of Exercise per Week: 3 days    Minutes of Exercise per Session: 30 min  Stress: No Stress Concern Present (12/04/2022)   [employer] of Occupational Health - Occupational Stress Questionnaire    Feeling of Stress : Not at all  Social Connections: Moderately Isolated (10/15/2023)   Social Connection and Isolation Panel    Frequency of Communication with Friends and Family: Never    Frequency of Social Gatherings with Friends and Family: Twice a week    Attends Religious Services: More than 4 times per year    Active Member of [employer] or Organizations: No    Attends Banker Meetings: Never    Marital Status: Married   Catering manager Violence: Not At Risk (10/15/2023)   Humiliation, Afraid, Rape, and Kick questionnaire    Fear of Current or Ex-Partner: No    Emotionally Abused: No    Physically Abused: No    Sexually Abused: No    Review of Systems: Pertinent positive and negative review of systems were noted in the above HPI section.  All other review of systems was otherwise negative.   Physical Exam: Vital signs in last 24 hours: Temp:  [97.6 F (36.4 C)-97.9 F (36.6 C)] 97.8 F (36.6 C) (08/26 1230) Pulse Rate:  [76-92] 82 (08/26 1230) Resp:  [14-29] 14 (08/26 1230) BP: (101-123)/(73-89) 101/78 (08/26 1230) SpO2:  [95 %-100 %] 98 % (08/26 1230) Weight:  [59.4 kg] 59.4 kg (08/25 1610) Last BM Date :  (UTA, pt is poor historian) General: Somnolent   well-developed, thin frail appearing elderly white male -aroused when directly spoken to and able to answer questions speech somewhat slow Head:  Normocephalic and atraumatic. Eyes:  Sclera clear, no icterus.   Conjunctiva pink. Ears:  Normal auditory acuity. Nose:  No deformity, discharge,  or lesions. Mouth: Slight facial droop on the left Neck:  Supple; no masses or thyromegaly. Lungs:  Clear throughout to auscultation.   No wheezes, crackles, or rhonchi.  Heart:  Regular rate and rhythm; no murmurs, clicks, rubs,  or gallops.  Pacemaker chest wall Abdomen:  Soft,nontender, BS active,nonpalp mass or hsm.  Rectal: Per Dr. Leigh prolapsing inflamed internal hemorrhoid right anterior, no gross blood on examining glove Msk:  Symmetrical without gross deformities. . Pulses:  Normal pulses noted. Extremities:  Without clubbing or edema. Neurologic:  Alert and  oriented x4;   Skin:  Intact without significant lesions or rashes.. Psych:  Alert and cooperative. Normal mood and affect.  Intake/Output from previous day: 08/25 0701 - 08/26 0700 In: 1000 [IV Piggyback:1000] Out: -  Intake/Output this shift: No intake/output data  recorded.  Lab Results: Recent Labs    10/14/23 1623 10/15/23 0423 10/15/23 1352  WBC 8.6 11.7* 10.2  HGB 13.4 12.7* 11.4*  HCT 41.4 39.4 34.6*  PLT 128* 98* 72*   BMET Recent Labs    10/14/23 1623 10/15/23 0423 10/15/23 0949  NA 134* 129* 128*  K 5.1 6.3* 4.9  CL 100 96* 93*  CO2 19* 14* 18*  GLUCOSE 89 79 146*  BUN 44* 53* 53*  CREATININE 2.28* 2.69* 2.65*  CALCIUM  9.1 9.0 8.5*   LFT No results for input(s): PROT, ALBUMIN , AST, ALT, ALKPHOS, BILITOT, BILIDIR, IBILI in the last 72 hours. PT/INR Recent Labs    10/14/23 1623 10/15/23 0423  LABPROT 40.7* 61.3*  INR 4.0* 6.7*   Hepatitis Panel No results for input(s): HEPBSAG, HCVAB, HEPAIGM, HEPBIGM in the last 72 hours.    IMPRESSION:  #32 83 year old white male brought to the emergency room after onset of rectal bleeding described as dark red blood per his wife onset on Sunday with 2-3 episodes Sunday, and yesterday. Initial labs showed hemoglobin of 13.4 and supratherapeutic INR at 4.0  Patient has not had further bleeding today, he has had some minimal drift in his hemoglobin 11.4 this afternoon INR still trending up-6.0 today  Patient has a friable prolapsing internal hemorrhoid on rectal exam very likely culprit for his rectal bleeding especially in the setting of high INR. Otherwise negative colonoscopy 2021  #2 supratherapeutic INR #3 history of permanent atrial fibrillation/on chronic Coumadin  #4 congestive heart failure with most recent EF 45% February 2025, ischemic cardiomyopathy #5 complete heart block status post pacemaker February 2025 #6 coronary artery disease status post previous CABG  #7 acute kidney injury on chronic kidney disease-hold, Entresto  on hold  #8 hyponatremia #9 elevated lactate   #10 altered mental status-probably metabolic encephalopathy CT head pending  #11 recent somnolence at home-etiology not clear, probable component related to medications  i.e. Elavil   #12 dementia #13 weight loss significant over the past 6 years-multifactoral #14 IBS/, sense of incomplete evacuation  #15 thrombocytopenia-chronic-ultrasound 2019 with hepatic steatosis-question cirrhosis   PLAN: Continue to hold Coumadin , allow INR to drift- Start Anusol  HC suppositories twice daily for hemorrhoidal bleeding Check TTG IgA, add BNP, CRP, CK Advance to regular diet Do not anticipate any endoscopic evaluation this admission May benefit from cardiology input, question repeat echo  Abdominal ultrasound  GI will follow with you  Amy EsterwoodPA-C  10/15/2023, 3:27 PM    ATTENDING ADDENDUM: I personally saw the patient and performed a substantive portion of this encounter (>50% time spent), including a complete performance of at least one of the key components (MDM, Hx and/or Exam), in conjunction with Amy Esterwood.  83 year old male with A-fib and CHF on Coumadin , longstanding IBS, rectal bleeding with history of hemorrhoids, weight loss, brought to the ED by his wife for a few days worth of rectal bleeding.  His INR is supratherapeutic on admission.  Last colonoscopy in 2021 for this issue showed no concerning pathology  other than hemorrhoids.  On DRE today he has an inflamed hemorrhoid in the RA area.  I think he is likely having hemorrhoidal bleeding causing his symptoms, his hemoglobin has not really dropped much at all.  Agree with holding Coumadin , keep INR at lowest acceptable range and we can give him some Anusol  for treatment of hemorrhoids at this time and monitor.  I think yield of colonoscopy here will be pretty low and we will hold off on that for now.  Several minutes spent discussing his symptoms with his wife.  She is much more concerned about his longstanding loose stools, progressive weight loss over the past several years, and his somnolence, stating he is sleeping all the time.  We discussed options.  He has had a pretty extensive workup  for this in the past.  I do not see celiac lab testing in the past, we will check that to make sure negative.  He has been on a variety of regimens per Dr. Stacia.  Elavil  has definitely helped his bowel however he is rather somnolent and this could be contributing to drowsiness.  Will cut back the dose to 25 mg and may consider tapering off pending his course.  Of note he has an AKI with hyperkalemia, hyponatremia, elevated lactate.  Defer to primary team regarding management of his metabolic derangements and perhaps that is also contributing to his somnolence.  He can advance to regular diet.  Regarding his weight loss, this has been progressive over many years.  Sounds like he is eating well.  He has not had cross-sectional imaging of his chest for some time, consider chest CT during his hospitalization.  We can reassess him tomorrow, call in the interim with any questions or concerns.  Marcey Naval, MD [facility]

## 2023-10-15 NOTE — Progress Notes (Addendum)
 PROGRESS NOTE   Jason Day  FMW:[id number]    DOB: [birth year]    DOA: 10/14/2023  PCP: [name], PA-C   I have briefly reviewed patients previous medical records in [facility].   Brief Hospital Course:   Jason Day is an 83 y.o. married male with medical history significant for memory loss, CAD, chronic HFmrEF, atrial fibrillation on warfarin, CKD 3A, heart block with pacemaker, hemorrhoids, sigmoid diverticulosis, and IBS, now presenting with rectal bleeding.   Patient is accompanied by his wife who assists with the history.  He has been experiencing bright red blood per rectum since 10/12/2023 and has developed loss of appetite, increased fatigue, and exertional dyspnea.  Patient denies any associated abdominal pain, nausea, or vomiting.  He reportedly had similar symptoms several years ago.  Admitted for rectal bleeding complicating warfarin related anticoagulation/coagulopathy, acute on chronic kidney disease and some mental status changes.  Jason Day and input pending.   Assessment & Plan:   Rectal bleeding Suspected hemorrhoidal bleed Hemodynamically stable.  Minimal hemoglobin drop from 13.4 >12 0.7 > 11.4 suggesting low volume bleeding.  Some of the hemoglobin drop is also hemodilutional. Colonoscopy 08/04/2019 by Dr. Lennard, [facility] showed internal hemorrhoids, may be the source of his bleeding again. [facility] consultation appreciated. Discussed with Dr. Leigh.  Suspecting hemorrhoidal bleeding, starting Anusol  suppositories and getting abdominal ultrasound Continue to hold warfarin and keep INR at the lowest range possible.   AKI superimposed on CKD 3A Hyponatremia SCr is 2.28 >2.69 on admission; baseline appears to be in the 1.1-1.2 range (between February and June 2025).  Since then creatinine has been up.  May be due to multiple meds below. Hold Aldactone , valsartan , and Farxiga , continue IVF hydration, renally-dose medications, and follow BMP  closely. Hyponatremia may be due to AKI or volume depletion. Check bladder scan to rule out urinary retention.  Discussed with ED RN Ordered renal ultrasound to rule out obstructive etiologies.  Hyperkalemia Anion gap metabolic acidosis Lactic acidosis Potassium up to 6.3.   S/p IV insulin  dextrose , calcium , bicarbonate and p.o. Lokelma . Repeat BMP, potassium down to 4.9. Lactate up to 4.8, unclear etiology.  Bolused with 250 mL IVF, continued maintenance IV fluids, lactate down to 3.4.  Continue to trend serum lactate.   Atrial fibrillation Supratherapeutic INR Sinus rhythm on telemetry.  Continue metoprolol . Presented with INR of 4.0 which increased to 6.7.   Warfarin on hold.  Did get a dose of vitamin K 2.5 mg. Follow INR in AM.  Chronic HFmrEF CHB s/p PPM CAD s/p CABG Ischemic cardiomyopathy 2D echo 03/30/2023: LVEF 45-50%, had regional wall motion abnormalities, diastolic parameters were indeterminate. Appears on the dry side. Hold diuretics, ARB, and Farxiga  for now in light of AKI  Could consider Cardiology consultation in a.m.  CAD No anginal complaints     Continue Toprol  and Lipitor    Memory impairment/dementia Acute metabolic encephalopathy AMS likely multifactorial related to suspected underlying cognitive impairment, hospitalization and acute illness/AKI. Delirium precautions.  Avoid sedatives and opioids. As needed low-dose IV Haldol  for agitation.  EKG on admission personally reviewed: Paced rhythm, bundle branch morphology, QTc 521 ms Discussed with Dr. Leigh, GI.  His note appreciated.  Concern of somnolence and he has reduced amitriptyline  dose from 50 to 25 mg at bedtime with eventual plan to wean off.  Agree. CT head without acute findings.  Prolonged QTc Minimize QT prolonging medications as much as possible.  Magnesium  2.5.  Potassium 4.9.  Thrombocytopenia Appears chronic.  Follow CBC daily.  Weight loss Please see GI consult note from 8/26  for details. They just getting CT chest during this hospitalization and can be done over the next day or 2.   Body mass index is 17.28 kg/m.   DVT prophylaxis: SCDs Start: 10/15/23 0056     Code Status: Full Code:  Family Communication: Discussed with spouse via phone, she just left the hospital about 20 minutes ago and states that patient was doing much better, had more color to him, ate some food, mental status had significantly improved and was almost close to his baseline. Disposition:  Status is: Inpatient Remains inpatient appropriate because: AKI, hyperkalemia, coagulopathy, IVF, close monitoring and management of BMP, GI consult.     Consultants:   [facility]  Procedures:     Subjective:  Seen this morning while still in ED.  Reports that he stays with his spouse.  Alert and oriented x 2.  Difficult to understand speech due to suspected dry mouth.  Aware that he is in the hospital due to rectal bleeding.  Denies complaints including pain, chest pain or dyspnea.  As per RN, no acute issues.  Reportedly was more oriented earlier this morning.  Patient insisting that he wants to be discharged home today.  States that he is fine.  Objective:   Vitals:   10/14/23 2345 10/15/23 0137 10/15/23 0512 10/15/23 0700  BP:  113/73 113/76 105/78  Pulse:  76 79 84  Resp:  18 20 18   Temp: 97.7 F (36.5 C)  97.6 F (36.4 C)   TempSrc: Temporal  Axillary   SpO2:  95% 100% 99%  Weight:      Height:        General exam: Elderly male, moderately built and frail lying comfortably propped up in bed without distress.  Oral mucosa dry. Respiratory system: Clear to auscultation. Respiratory effort normal. Cardiovascular system: S1 & S2 heard, RRR. No JVD, murmurs, rubs, gallops or clicks. No pedal edema.  Telemetry at bedside personally reviewed: Sinus rhythm with BBB morphology. Gastrointestinal system: Abdomen is nondistended, soft and nontender. No organomegaly or masses felt. Normal  bowel sounds heard. Central nervous system: Alert and oriented x 2. No focal neurological deficits. Extremities: Symmetric 5 x 5 power.  Extensive old bruising on bilateral upper extremities and right lower extremity. Skin: No rashes, lesions or ulcers Psychiatry: Judgement and insight appears impaired. Mood & affect somewhat agitated    Data Reviewed:   I have personally reviewed following labs and imaging studies   CBC: Recent Labs  Lab 10/14/23 1623 10/15/23 0423  WBC 8.6 11.7*  NEUTROABS 6.9  --   HGB 13.4 12.7*  HCT 41.4 39.4  MCV 99.0 99.2  PLT 128* 98*    Basic Metabolic Panel: Recent Labs  Lab 10/14/23 1623 10/15/23 0423  NA 134* 129*  K 5.1 6.3*  CL 100 96*  CO2 19* 14*  GLUCOSE 89 79  BUN 44* 53*  CREATININE 2.28* 2.69*  CALCIUM  9.1 9.0    Liver Function Tests: No results for input(s): AST, ALT, ALKPHOS, BILITOT, PROT, ALBUMIN  in the last 168 hours.  CBG: Recent Labs  Lab 10/15/23 0758  GLUCAP 88    Microbiology Studies:  No results found for this or any previous visit (from the past 240 hours).  Radiology Studies:  No results found.  Scheduled Meds:    amitriptyline   50 mg Oral QHS   atorvastatin   10 mg Oral Daily  metoprolol  succinate  12.5 mg Oral Daily   sodium chloride  flush  3 mL Intravenous Q12H   venlafaxine  XR  37.5 mg Oral Q breakfast    Continuous Infusions:    sodium chloride  Stopped (10/15/23 0748)     LOS: 1 day     Jason Mar, MD,  FACP, [facility], [facility], [facility]   [facility] [place]      To contact the attending provider between 7A-7P or the covering provider during after hours 7P-7A, please log into the web site [email or web address] and access using universal [place] password for that web site. If you do not have the password, please call the hospital operator.  10/15/2023, 8:34 AM

## 2023-10-15 NOTE — Care Plan (Signed)
 Patient was transferred from ED via stretcher. No complaints of pain, but patient was confused and upset verbalizing I want to go home, I don't want to be here. Somebody said that I will go home and he keeps on calling his wife on his phone. Wife answered and updated about patient's room number and was concerned that he has slurring of speech. Provider made aware and ordered Head CT. Patient's bed alarm turned on and call bell within reach. Positioned to comfort. Carried out orders.

## 2023-10-15 NOTE — ED Notes (Signed)
 CCMD notified. Pt is on monitor.

## 2023-10-15 NOTE — ED Notes (Signed)
 Provider aware of patient's increased agitation with fluids and the monitor. Ok to leave him be for now and reassess later. Bed alarm remains in place and window/door open. Lights dimmed for comfort.

## 2023-10-15 NOTE — Telephone Encounter (Signed)
 Left detailed message on pt's wife phone that we saw he is in the hospital and hope he improves soon

## 2023-10-15 NOTE — Plan of Care (Signed)

## 2023-10-15 NOTE — H&P (Addendum)
 History and Physical    Jason Day FMW:[id number] DOB: Sep 09, 1940 DOA: 10/14/2023  PCP: [name], PA-C   Patient coming from: Home   Chief Complaint: Rectal bleeding   HPI: Jason Day is an 83 y.o. male with medical history significant for memory loss, CAD, chronic HFmrEF, atrial fibrillation on warfarin, CKD 3A, heart block with pacemaker, hemorrhoids, sigmoid diverticulosis, and IBS, now presenting with rectal bleeding.  Patient is accompanied by his wife who assists with the history.  He has been experiencing bright red blood per rectum since 10/12/2023 and has developed loss of appetite, increased fatigue, and exertional dyspnea.  Patient denies any associated abdominal pain, nausea, or vomiting.  He reportedly had similar symptoms several years ago.  ED Course: Upon arrival to the ED, patient is found to be afebrile and saturating well on room air with normal HR and stable BP.  Labs are most notable for BUN 44, creatinine 2.28, normal hemoglobin, platelets 128,000, and INR 4.0.  GI (Dr. Albertus) was consulted by the ED PA and recommended holding warfarin but not reversing at this time given the patient's clinical stability.  Type and screen was performed and the patient was given a liter of LR and Zofran .  Review of Systems:  All other systems reviewed and apart from HPI, are negative.  Past Medical History:  Diagnosis Date   Acute combined systolic and diastolic congestive heart failure (HCC) 10/16/2016   Allergic rhinitis 09/01/2020   Anal fissure    Angio-edema    Bleeding hemorrhoids    Chronic combined systolic and diastolic heart failure (HCC)    Chronic sinus complaints    overuses afrin   Coronary atherosclerosis of native coronary artery    Dyspnea    Encounter for medical examination to establish care 04/22/2017   Gallstones    Hyperlipidemia    Irritable bowel syndrome 06/07/2017   Ischemic cardiomyopathy    Left atrial enlargement    Long term (current) use  of anticoagulants 12/30/2020   Moderate mitral regurgitation    Myocardial infarction (HCC) 09/2015   Persistent atrial fibrillation (HCC)    RBBB    A- Fib   Shortness of breath 12/29/2020   Urinary incontinence 12/30/2020    Past Surgical History:  Procedure Laterality Date   BACK SURGERY  ~2014   disc repair   CARDIOVERSION N/A 10/24/2018   Procedure: CARDIOVERSION;  Surgeon: Jeffrie Oneil BROCKS, MD;  Location: [facility] ENDOSCOPY;  Service: Cardiovascular;  Laterality: N/A;   CHOLECYSTECTOMY     COLONOSCOPY  04/06/2014   Hemorrhoids and diverticulosis performed in [place]    COLONOSCOPY WITH PROPOFOL  N/A 08/04/2019   Procedure: COLONOSCOPY WITH PROPOFOL ;  Surgeon: Lennard Lesta FALCON, MD;  Location: [facility] ENDOSCOPY;  Service: Endoscopy;  Laterality: N/A;   CORONARY ARTERY BYPASS GRAFT N/A 10/18/2016   Procedure: CORONARY ARTERY BYPASS GRAFTING (CABG) x five , using left internal mammary artery and right leg greater saphenous vein harvested endoscopically;  Surgeon: Lucas Dorise POUR, MD;  Location: [facility] OR;  Service: Open Heart Surgery;  Laterality: N/A;   ESOPHAGOGASTRODUODENOSCOPY (EGD) WITH PROPOFOL  N/A 08/04/2019   Procedure: ESOPHAGOGASTRODUODENOSCOPY (EGD) WITH PROPOFOL ;  Surgeon: Lennard Lesta FALCON, MD;  Location: [facility] ENDOSCOPY;  Service: Endoscopy;  Laterality: N/A;   FOOT SURGERY     HEMORRHOID BANDING     HEMORRHOID SURGERY     PACEMAKER IMPLANT N/A 04/02/2023   Procedure: PACEMAKER IMPLANT;  Surgeon: Cindie Ole DASEN, MD;  Location: [facility] INVASIVE CV LAB;  Service: Cardiovascular;  Laterality: N/A;  REVERSE SHOULDER ARTHROPLASTY Left 08/21/2018   Procedure: REVERSE SHOULDER ARTHROPLASTY;  Surgeon: Melita Drivers, MD;  Location: [facility] ORS;  Service: Orthopedics;  Laterality: Left;   RIGHT/LEFT HEART CATH AND CORONARY ANGIOGRAPHY N/A 10/16/2016   Procedure: RIGHT/LEFT HEART CATH AND CORONARY ANGIOGRAPHY;  Surgeon: Claudene Victory ORN, MD;  Location: [facility] INVASIVE CV LAB;  Service: Cardiovascular;  Laterality: N/A;    stents in liver     TEE WITHOUT CARDIOVERSION N/A 10/18/2016   Procedure: TRANSESOPHAGEAL ECHOCARDIOGRAM (TEE);  Surgeon: Lucas Dorise POUR, MD;  Location: [facility] OR;  Service: Open Heart Surgery;  Laterality: N/A;   TRANSURETHRAL RESECTION OF PROSTATE N/A 11/19/2019   Procedure: CYSTOSCOPY TRANSURETHRAL RESECTION OF THE PROSTATE (TURP);  Surgeon: Watt Rush, MD;  Location: [facility] ORS;  Service: Urology;  Laterality: N/A;    Social History:   reports that he quit smoking about 55 years ago. His smoking use included cigarettes. He has never been exposed to tobacco smoke. He has never used smokeless tobacco. He reports that he does not drink alcohol  and does not use drugs.  No Known Allergies  Family History  Problem Relation Age of Onset   COPD Father    Emphysema Father    Healthy Brother    Diabetes Neg Hx    Cancer Neg Hx    Stomach cancer Neg Hx    Colon cancer Neg Hx    Allergic rhinitis Neg Hx    Angioedema Neg Hx    Asthma Neg Hx    Eczema Neg Hx    Urticaria Neg Hx      Prior to Admission medications   Medication Sig Start Date End Date Taking? Authorizing Provider  desvenlafaxine  (PRISTIQ ) 50 MG 24 hr tablet Take 1 tablet (50 mg total) by mouth daily. 09/27/23   Tysinger, Alm RAMAN, PA-C  amitriptyline  (ELAVIL ) 25 MG tablet Take 2 tablets (50 mg total) by mouth at bedtime. 07/23/23   Stacia Glendia BRAVO, MD  atorvastatin  (LIPITOR) 10 MG tablet Take 1 tablet (10 mg total) by mouth daily. 10/01/23   Nishan, Peter C, MD  dapagliflozin  propanediol (FARXIGA ) 10 MG TABS tablet Take 1 tablet (10 mg total) by mouth daily before breakfast. 01/31/23   Rolan Ezra RAMAN, MD  metoprolol  succinate (TOPROL -XL) 25 MG 24 hr tablet Take 0.5 tablets (12.5 mg total) by mouth daily. Take 12.5mg  daily 06/25/23   Nishan, Peter C, MD  spironolactone  (ALDACTONE ) 25 MG tablet Take 0.5 tablets (12.5 mg total) by mouth daily. 09/12/23   Rolan Ezra RAMAN, MD  valsartan  (DIOVAN ) 40 MG tablet Take 0.5 tablets (20 mg total)  by mouth daily. 09/24/23   Rolan Ezra RAMAN, MD  Vitamin D , Ergocalciferol , (DRISDOL ) 1.25 MG (50000 UNIT) CAPS capsule Take 1 capsule (50,000 Units total) by mouth every 7 (seven) days. 07/31/23   Stacia Glendia BRAVO, MD  warfarin (COUMADIN ) 5 MG tablet Take 1-1.5 tablets (5-7.5 mg total) by mouth daily as directed by coumadin  clinic. 08/12/23   Delford Maude BROCKS, MD    Physical Exam: Vitals:   10/14/23 1830 10/14/23 1900 10/14/23 1930 10/14/23 2345  BP: 114/84 115/89 123/84   Pulse: 79 78 78   Resp: 20 (!) 24 (!) 29   Temp:    97.7 F (36.5 C)  TempSrc:    Temporal  SpO2: 100% 100% 100%   Weight:      Height:        Constitutional: NAD, no pallor or diaphoresis   Eyes: PERTLA, lids and conjunctivae normal ENMT: Mucous  membranes are moist. Posterior pharynx clear of any exudate or lesions.   Neck: supple, no masses  Respiratory: no wheezing, no crackles. No accessory muscle use.  Cardiovascular: S1 & S2 heard, regular rate and rhythm. Mild pretibial edema.  Abdomen: No tenderness, soft. Bowel sounds active.  Musculoskeletal: no clubbing / cyanosis. No joint deformity upper and lower extremities.   Skin: no significant rashes, lesions, ulcers. Warm, dry, well-perfused. Neurologic: CN 2-12 grossly intact. Moving all extremities. Alert and oriented to person only.  Psychiatric: Calm. Intermittently cooperative.    Labs and Imaging on Admission: I have personally reviewed following labs and imaging studies  CBC: Recent Labs  Lab 10/14/23 1623  WBC 8.6  NEUTROABS 6.9  HGB 13.4  HCT 41.4  MCV 99.0  PLT 128*   Basic Metabolic Panel: Recent Labs  Lab 10/14/23 1623  NA 134*  K 5.1  CL 100  CO2 19*  GLUCOSE 89  BUN 44*  CREATININE 2.28*  CALCIUM  9.1   GFR: Estimated Creatinine Clearance: 21 mL/min (A) (by C-G formula based on SCr of 2.28 mg/dL (H)). Liver Function Tests: No results for input(s): AST, ALT, ALKPHOS, BILITOT, PROT, ALBUMIN  in the last 168  hours. No results for input(s): LIPASE, AMYLASE in the last 168 hours. No results for input(s): AMMONIA in the last 168 hours. Coagulation Profile: Recent Labs  Lab 10/14/23 1623  INR 4.0*   Cardiac Enzymes: No results for input(s): CKTOTAL, CKMB, CKMBINDEX, TROPONINI in the last 168 hours. BNP (last 3 results) No results for input(s): PROBNP in the last 8760 hours. HbA1C: No results for input(s): HGBA1C in the last 72 hours. CBG: No results for input(s): GLUCAP in the last 168 hours. Lipid Profile: No results for input(s): CHOL, HDL, LDLCALC, TRIG, CHOLHDL, LDLDIRECT in the last 72 hours. Thyroid  Function Tests: No results for input(s): TSH, T4TOTAL, FREET4, T3FREE, THYROIDAB in the last 72 hours. Anemia Panel: No results for input(s): VITAMINB12, FOLATE, FERRITIN, TIBC, IRON, RETICCTPCT in the last 72 hours. Urine analysis:    Component Value Date/Time   COLORURINE STRAW (A) 03/29/2023 1850   APPEARANCEUR CLEAR 03/29/2023 1850   LABSPEC 1.004 (L) 03/29/2023 1850   PHURINE 5.0 03/29/2023 1850   GLUCOSEU NEGATIVE 03/29/2023 1850   GLUCOSEU NEGATIVE 04/22/2017 1431   HGBUR SMALL (A) 03/29/2023 1850   BILIRUBINUR NEGATIVE 03/29/2023 1850   KETONESUR NEGATIVE 03/29/2023 1850   PROTEINUR NEGATIVE 03/29/2023 1850   UROBILINOGEN 1.0 04/22/2017 1431   NITRITE NEGATIVE 03/29/2023 1850   LEUKOCYTESUR NEGATIVE 03/29/2023 1850   Sepsis Labs: @LABRCNTIP (procalcitonin:4,lacticidven:4) )No results found for this or any previous visit (from the past 240 hours).   Radiological Exams on Admission: No results found.   Assessment/Plan   Addendum (05:20): SCr is increased this morning, potassium is 6.3, INR is 6.7, and Hgb 12.7. Plan to give IVF bolus, low-dose oral vit K, Lokelma , insulin /dextrose , and calcium , continue cardiac monitoring, and repeat potassium level in a few hours.    1. Rectal bleeding - He is hemodynamically  stable and initial Hgb is normal despite 3 days of painless rectal bleeding  - Hold warfarin, trend H&H, continue bowel-rest for now     2. AKI superimposed on CKD 3A   - SCr is 2.28 on admission; baseline appears to be closer to 1.2    - Hold Aldactone , valsartan , and Farxiga , continue IVF hydration, renally-dose medications, and repeat chem panel in am   3. Atrial fibrillation; supratherapeutic INR - INR is 4.0 with non-life-threatening bleeding  -  Hold warfarin for now, continue metoprolol    4. Chronic HFmrEF  - Appears compensated  - Hold diuretics, ARB, and Farxiga  for now in light of AKI   5. CAD - No anginal complaints     - Continue Toprol  and Lipitor   6. Memory impairment  - Delirium precautions    DVT prophylaxis: SCDs  Code Status: Full  Level of Care: Level of care: Telemetry Medical Family Communication: Wife updated in ED  Disposition Plan:  Patient is from: Home  Anticipated d/c is to: TBD Anticipated d/c date is: 10/17/23  Patient currently: Pending stable H&H, improved renal function  Consults called: GI  Admission status: Inpatient     Evalene GORMAN Sprinkles, MD [facility]  10/15/2023, 12:59 AM

## 2023-10-15 NOTE — Telephone Encounter (Signed)
 Pts wife calling concerned about pt. States he is quite agitated and wants to see GI, states they were told he would be seen this am. Let her know The inpt team would be notified.

## 2023-10-15 NOTE — ED Notes (Signed)
 Pt given some more water  per request.

## 2023-10-16 DIAGNOSIS — R791 Abnormal coagulation profile: Secondary | ICD-10-CM | POA: Diagnosis not present

## 2023-10-16 DIAGNOSIS — K649 Unspecified hemorrhoids: Secondary | ICD-10-CM | POA: Diagnosis not present

## 2023-10-16 DIAGNOSIS — K625 Hemorrhage of anus and rectum: Secondary | ICD-10-CM | POA: Diagnosis not present

## 2023-10-16 LAB — CBC
HCT: 37.1 % — ABNORMAL LOW (ref 39.0–52.0)
Hemoglobin: 12.3 g/dL — ABNORMAL LOW (ref 13.0–17.0)
MCH: 32.3 pg (ref 26.0–34.0)
MCHC: 33.2 g/dL (ref 30.0–36.0)
MCV: 97.4 fL (ref 80.0–100.0)
Platelets: 76 K/uL — ABNORMAL LOW (ref 150–400)
RBC: 3.81 MIL/uL — ABNORMAL LOW (ref 4.22–5.81)
RDW: 14 % (ref 11.5–15.5)
WBC: 10.9 K/uL — ABNORMAL HIGH (ref 4.0–10.5)
nRBC: 0.2 % (ref 0.0–0.2)

## 2023-10-16 LAB — BASIC METABOLIC PANEL WITH GFR
Anion gap: 16 — ABNORMAL HIGH (ref 5–15)
BUN: 56 mg/dL — ABNORMAL HIGH (ref 8–23)
CO2: 18 mmol/L — ABNORMAL LOW (ref 22–32)
Calcium: 8.4 mg/dL — ABNORMAL LOW (ref 8.9–10.3)
Chloride: 96 mmol/L — ABNORMAL LOW (ref 98–111)
Creatinine, Ser: 2.68 mg/dL — ABNORMAL HIGH (ref 0.61–1.24)
GFR, Estimated: 23 mL/min — ABNORMAL LOW (ref >60)
Glucose, Bld: 106 mg/dL — ABNORMAL HIGH (ref 70–99)
Potassium: 4.9 mmol/L (ref 3.5–5.1)
Sodium: 130 mmol/L — ABNORMAL LOW (ref 135–145)

## 2023-10-16 LAB — PROTIME-INR
INR: 9 (ref 0.8–1.2)
INR: 6.5 (ref 0.8–1.2)
Prothrombin Time: 76.5 s — ABNORMAL HIGH (ref 11.4–15.2)
Prothrombin Time: 59.9 s — ABNORMAL HIGH (ref 11.4–15.2)

## 2023-10-16 LAB — C-REACTIVE PROTEIN: CRP: 5.7 mg/dL — ABNORMAL HIGH (ref <1.0)

## 2023-10-16 MED ORDER — SODIUM CHLORIDE 0.9 % IV SOLN: INTRAVENOUS | Status: DC

## 2023-10-16 MED ORDER — PHYTONADIONE 5 MG PO TABS
2.5000 mg | ORAL_TABLET | Freq: Once | ORAL | Status: AC
  Administered 2023-10-16: 2.5 mg via ORAL
  Filled 2023-10-16: qty 1

## 2023-10-16 NOTE — Progress Notes (Addendum)
 Jason Day  FMW:[id number] DOB: 06-10-1940 DOA: 10/14/2023 PCP: [name], PA-C    Brief Narrative:  83 year old with a history of memory loss, CAD, chronic systolic CHF, atrial fibrillation on warfarin, CKD stage IIIa, heart block status post pacemaker, sigmoid diverticulosis, hemorrhoids, and IBS who presented to the ER 8/25 with rectal bleeding described as bright red blood per rectum since 8/23 associated with loss of appetite increased fatigue and exertional dyspnea.  Goals of Care:   Code Status: Full Code   DVT prophylaxis: SCDs Start: 10/15/23 0056   Interim Hx: Afebrile.  Vital signs stable with borderline hypotension with systolics 102-110.  Resting comfortably in bed this afternoon.  No new complaints at this time.  Reports decreasing frequency of bowel movements since admission.  Assessment & Plan:  BRBPR - suspected hemorrhoidal bleeding Has remained hemodynamically stable -hemoglobin slightly decreased from 13.4 presentation to 11.4 -colonoscopy 08/04/2019 noted internal hemorrhoids -discussed care with GI who agreed with Anusol  suppositories -warfarin on hold  Severe somnolence/lethargy Possibly related to Elavil  being used for treatment of IBS - Elavil  dose has been decreased   Chronic persistent weight loss Followed by GI in outpatient setting with decrease in weight from 240 pounds to 140 pounds unintentionally over a 6-year timeframe - CT chest suggested during this admission  IBS Long history of complicated difficult to manage IBS - longstanding loose stools - see GI note for history - to consider a trial of citrucel   AKI on CKD stage IIIa Creatinine 2.69 on admission -baseline creatinine approximately 1.2 -holding Aldactone  valsartan  and Farxiga  -volume resuscitated w/o immediate impact - renal US  without acutely reversible issues and consistent with medical renal disease  Recent Labs  Lab 10/14/23 1623 10/15/23 0423 10/15/23 0949 10/16/23 0301   CREATININE 2.28* 2.69* 2.65* 2.68*     Hyperkalemia Received acute temporizing measures as well as Lokelma  initially  Lactic acidosis Possibly simply due to hypovolemia - monitor trend   Atrial fibrillation on chronic warfarin  Rate controlled -continue metoprolol   Supratherapeutic INR INR 4.0 presentation with increased to 9.0 during admission -warfarin on hold -dosed with vitamin K x 1 - INR slowly improving   Chronic systolic CHF -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% via TTE 03/30/2023 -no volume overload at present  Complete heart block status post PPM  CAD status post CABG Asymptomatic  Memory impairment/dementia -acute delirium Multifactorial related to underlying cognitive impairment complicated by hospitalization and acute illness -CT head without acute findings  Prolonged QTc QTc 521 ms during this admission -care with medication selection  Chronic thrombocytopenia Platelet count relatively stable -follow trend  Underweight - Body mass index is 17.28 kg/m.   Family Communication: No family present at time of exam Disposition: Anticipate eventual discharge home   Objective: Blood pressure 102/72, pulse 66, temperature 98.3 F (36.8 C), temperature source Oral, resp. rate 19, height 6' 1 (1.854 m), weight 59.4 kg, SpO2 97%.  Intake/Output Summary (Last 24 hours) at 10/16/2023 9077 Last data filed at 10/16/2023 0700 Gross per 24 hour  Intake 2283.16 ml  Output 350 ml  Net 1933.16 ml   Filed Weights   10/14/23 1610  Weight: 59.4 kg    Examination: General: No acute respiratory distress Lungs: Clear to auscultation bilaterally without wheezes or crackles Cardiovascular: Regular rate and rhythm without murmur gallop or rub normal S1 and S2 Abdomen: Nontender, nondistended, soft, bowel sounds positive, no rebound, no ascites, no appreciable mass Extremities: No significant cyanosis, clubbing, or edema bilateral lower extremities  CBC:  Recent Labs  Lab  10/14/23 1623 10/15/23 0423 10/15/23 1352 10/16/23 0301  WBC 8.6 11.7* 10.2 10.9*  NEUTROABS 6.9  --   --   --   HGB 13.4 12.7* 11.4* 12.3*  HCT 41.4 39.4 34.6* 37.1*  MCV 99.0 99.2 98.0 97.4  PLT 128* 98* 72* 76*   Basic Metabolic Panel: Recent Labs  Lab 10/15/23 0423 10/15/23 0949 10/16/23 0301  NA 129* 128* 130*  K 6.3* 4.9 4.9  CL 96* 93* 96*  CO2 14* 18* 18*  GLUCOSE 79 146* 106*  BUN 53* 53* 56*  CREATININE 2.69* 2.65* 2.68*  CALCIUM  9.0 8.5* 8.4*  MG  --  2.5*  --    GFR: Estimated Creatinine Clearance: 17.9 mL/min (A) (by C-G formula based on SCr of 2.68 mg/dL (H)).   Scheduled Meds:  amitriptyline   25 mg Oral QHS   atorvastatin   10 mg Oral Daily   hydrocortisone   25 mg Rectal BID   lactose free nutrition  237 mL Oral TID WC   metoprolol  succinate  12.5 mg Oral Daily   sodium chloride  flush  3 mL Intravenous Q12H   venlafaxine  XR  37.5 mg Oral Q breakfast      LOS: 2 days   Reyes IVAR Moores, MD [facility] Office  [phone] Pager - Text Page per Tracey  If 7PM-7AM, please contact night-coverage per Amion 10/16/2023, 9:22 AM

## 2023-10-16 NOTE — Plan of Care (Signed)
 Video sitter in place throughout the shift, able to call the RN and able to redirect patient whenever he gets up. Patient needs to be be reminded when to order his meal. Patient has still poor appetite on my shift but able to finish his boost. Patient was unable to order his breakfast due to sleep and just only ate lunch and said that he will eat dinner after his nap. Health teaching rendered with a need of reinforcement and explanation. Problem: Education: Goal: Knowledge of General Education information will improve Description: Including pain rating scale, medication(s)/side effects and non-pharmacologic comfort measures Outcome: Progressing   Problem: Health Behavior/Discharge Planning: Goal: Ability to manage health-related needs will improve Outcome: Progressing   Problem: Clinical Measurements: Goal: Ability to maintain clinical measurements within normal limits will improve Outcome: Progressing Goal: Will remain free from infection Outcome: Progressing Goal: Diagnostic test results will improve Outcome: Progressing Goal: Respiratory complications will improve Outcome: Progressing Goal: Cardiovascular complication will be avoided Outcome: Progressing   Problem: Activity: Goal: Risk for activity intolerance will decrease Outcome: Progressing   Problem: Nutrition: Goal: Adequate nutrition will be maintained Outcome: Progressing   Problem: Coping: Goal: Level of anxiety will decrease Outcome: Progressing   Problem: Elimination: Goal: Will not experience complications related to bowel motility Outcome: Progressing Goal: Will not experience complications related to urinary retention Outcome: Progressing   Problem: Pain Managment: Goal: General experience of comfort will improve and/or be controlled Outcome: Progressing   Problem: Safety: Goal: Ability to remain free from injury will improve Outcome: Progressing   Problem: Skin Integrity: Goal: Risk for impaired  skin integrity will decrease Outcome: Progressing

## 2023-10-16 NOTE — Plan of Care (Signed)
 Video sitter in place throughout the shift.  The video monitor tech needed to correct pt's behavior once when he tried to get out of bed in order to urinate. IVF infusing during shift. Critical lab INR=9.0, vit K to be given this morning.  Problem: Education: Goal: Knowledge of General Education information will improve Description: Including pain rating scale, medication(s)/side effects and non-pharmacologic comfort measures Outcome: Progressing   Problem: Health Behavior/Discharge Planning: Goal: Ability to manage health-related needs will improve Outcome: Progressing   Problem: Clinical Measurements: Goal: Ability to maintain clinical measurements within normal limits will improve Outcome: Progressing Goal: Will remain free from infection Outcome: Progressing Goal: Diagnostic test results will improve Outcome: Progressing Goal: Respiratory complications will improve Outcome: Progressing Goal: Cardiovascular complication will be avoided Outcome: Progressing   Problem: Activity: Goal: Risk for activity intolerance will decrease Outcome: Progressing   Problem: Nutrition: Goal: Adequate nutrition will be maintained Outcome: Progressing   Problem: Coping: Goal: Level of anxiety will decrease Outcome: Progressing   Problem: Elimination: Goal: Will not experience complications related to bowel motility Outcome: Progressing Goal: Will not experience complications related to urinary retention Outcome: Progressing   Problem: Pain Managment: Goal: General experience of comfort will improve and/or be controlled Outcome: Progressing   Problem: Safety: Goal: Ability to remain free from injury will improve Outcome: Progressing   Problem: Skin Integrity: Goal: Risk for impaired skin integrity will decrease Outcome: Progressing

## 2023-10-16 NOTE — Progress Notes (Signed)
 Progress Note   Subjective  No further bleeding symptoms. INR has risen. He denies complaints today. No BMs overnight.   Objective   Vital signs in last 24 hours: Temp:  [97.8 F (36.6 C)-98.6 F (37 C)] 98.3 F (36.8 C) (08/27 0807) Pulse Rate:  [66-82] 66 (08/27 0807) Resp:  [14-19] 19 (08/27 0807) BP: (101-110)/(72-78) 102/72 (08/27 0807) SpO2:  [97 %-98 %] 97 % (08/27 0807) Last BM Date :  (UTA, pt is poor historian) General:    white male in NAD Neurologic:  Alert and oriented,  grossly normal neurologically. Psych:  Cooperative. Normal mood and affect.  Intake/Output from previous day: 08/26 0701 - 08/27 0700 In: 2283.2 [P.O.:357; I.V.:1926.2] Out: 350 [Urine:350] Intake/Output this shift: No intake/output data recorded.  Lab Results: Recent Labs    10/15/23 0423 10/15/23 1352 10/16/23 0301  WBC 11.7* 10.2 10.9*  HGB 12.7* 11.4* 12.3*  HCT 39.4 34.6* 37.1*  PLT 98* 72* 76*   BMET Recent Labs    10/15/23 0423 10/15/23 0949 10/16/23 0301  NA 129* 128* 130*  K 6.3* 4.9 4.9  CL 96* 93* 96*  CO2 14* 18* 18*  GLUCOSE 79 146* 106*  BUN 53* 53* 56*  CREATININE 2.69* 2.65* 2.68*  CALCIUM  9.0 8.5* 8.4*   LFT No results for input(s): PROT, ALBUMIN , AST, ALT, ALKPHOS, BILITOT, BILIDIR, IBILI in the last 72 hours. PT/INR Recent Labs    10/15/23 0423 10/16/23 0301  LABPROT 61.3* 76.5*  INR 6.7* 9.0*    Studies/Results: US  RENAL Result Date: 10/15/2023 CLINICAL DATA:  Acute kidney injury. EXAM: RENAL / URINARY TRACT ULTRASOUND COMPLETE COMPARISON:  April 24, 2017 FINDINGS: Right Kidney: Renal measurements: 10.4 cm x 4.3 cm x 4.0 cm = volume: 92.93 mL. Diffusely increased echogenicity of the renal parenchyma is seen. No mass or hydronephrosis visualized. Left Kidney: Renal measurements: 9.6 cm x 3.9 cm x 3.8 cm = volume: 72.94 mL. Diffusely increased echogenicity of the renal parenchyma is seen. No mass or hydronephrosis visualized.  Bladder: Appears normal for degree of bladder distention. Other: Of incidental note is the presence of a right pleural effusion. IMPRESSION: 1. Bilateral echogenic kidneys which may represent sequelae associated with medical renal disease. 2. Right pleural effusion. Electronically Signed   By: Suzen Dials M.D.   On: 10/15/2023 23:33   CT HEAD WO CONTRAST ( ) Result Date: 10/15/2023 EXAM: CT HEAD WITHOUT CONTRAST 10/15/2023 02:40:00 PM TECHNIQUE: CT of the head was performed without the administration of intravenous contrast. Automated exposure control, iterative reconstruction, and/or weight based adjustment of the mA/kV was utilized to reduce the radiation dose to as low as reasonably achievable. COMPARISON: 02/11/2022 CLINICAL HISTORY: Mental status change, unknown cause. FINDINGS: BRAIN AND VENTRICLES: No acute hemorrhage. Gray-white differentiation is preserved. No hydrocephalus. No extra-axial collection. No mass effect or midline shift. Patchy cerebral white matter hypodensities, similar to the prior CT and nonspecific but compatible with mild-to-moderate chronic small vessel ischemic disease. Mild-to-moderate cerebral atrophy. 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 ORBITS: Bilateral cataract extraction. SINUSES: No acute abnormality. SOFT TISSUES AND SKULL: No acute soft tissue abnormality. No skull fracture. Calcified atherosclerosis at the skull base. IMPRESSION: 1. No acute intracranial abnormality. 2. Mild-to-moderate chronic small vessel ischemic disease and cerebral atrophy. 3. Small chronic left occipital infarct. Electronically signed by: Dasie Hamburg MD 10/15/2023 04:18 PM EDT RP Workstation: [id number]       Assessment / Plan:    83 y/o male here  with the following:  Rectal bleeding  Internal hemorrhoids Supratherapeutic INR IBS AKI  Hyponatremia Weight loss  No further bleeding overnight.  He has not had any further bowel movements.   Hemoglobin is stable today.  Based on exam and his last colonoscopy findings, I strongly suspect his bleeding was hemorrhoidal in the setting of supratherapeutic INR.  No significant drop in his hemoglobin.  His INR remains supratherapeutic and has risen to 9.  Hopefully his bleeding will be minimal when that has returned to normal range.  Continue Anusol  for now.  Recall he has chronic IBS, has failed a variety of regimens.  He has had problems with somnolence and in light of his dementia we thought Elavil  should be titrated down and perhaps stopped given his wife's concern about this.  Elavil  has been reduced to 25 mg daily and may consider tapering off pending his course.  He can follow-up with Dr. Stacia for long-term management of his IBS once he is out of the hospital.  If he has not tried Citrucel or fiber recently could try it again, twice daily, to see if that would provide some more regularity.  We have sent labs for celiac disease and that remains pending.  More concerning at this time I think this is AKI, hyponatremia and weight loss.  Defer to primary team regarding the metabolic derangements and workup of his kidney disease.  Otherwise regarding his weight loss which has been progressive over many years, cross-sectional imaging of his chest with chest CT I think is reasonable, could do during his hospitalization.  We will sign off for now, call with questions or concerns in the interim.  Marcey Naval, MD [facility] Gastroenterology

## 2023-10-17 ENCOUNTER — Inpatient Hospital Stay (HOSPITAL_COMMUNITY)

## 2023-10-17 ENCOUNTER — Ambulatory Visit

## 2023-10-17 DIAGNOSIS — R7401 Elevation of levels of liver transaminase levels: Secondary | ICD-10-CM | POA: Diagnosis not present

## 2023-10-17 DIAGNOSIS — N179 Acute kidney failure, unspecified: Secondary | ICD-10-CM | POA: Diagnosis not present

## 2023-10-17 DIAGNOSIS — K649 Unspecified hemorrhoids: Secondary | ICD-10-CM | POA: Diagnosis not present

## 2023-10-17 DIAGNOSIS — I5022 Chronic systolic (congestive) heart failure: Secondary | ICD-10-CM

## 2023-10-17 DIAGNOSIS — K625 Hemorrhage of anus and rectum: Secondary | ICD-10-CM | POA: Diagnosis not present

## 2023-10-17 LAB — ECHOCARDIOGRAM COMPLETE
AR max vel: 1.91 cm2
AV Area VTI: 1.47 cm2
AV Area mean vel: 1.95 cm2
AV Mean grad: 7 mmHg
AV Peak grad: 12.6 mmHg
Ao pk vel: 1.78 m/s
Area-P 1/2: 4.6 cm2
Calc EF: 32.6 %
Height: 73 in
S' Lateral: 5.5 cm
Single Plane A2C EF: 33.8 %
Single Plane A4C EF: 34.2 %
Weight: 2096 [oz_av]

## 2023-10-17 LAB — CBC
HCT: 35.8 % — ABNORMAL LOW (ref 39.0–52.0)
Hemoglobin: 12.1 g/dL — ABNORMAL LOW (ref 13.0–17.0)
MCH: 32.4 pg (ref 26.0–34.0)
MCHC: 33.8 g/dL (ref 30.0–36.0)
MCV: 96 fL (ref 80.0–100.0)
Platelets: 65 K/uL — ABNORMAL LOW (ref 150–400)
RBC: 3.73 MIL/uL — ABNORMAL LOW (ref 4.22–5.81)
RDW: 14.1 % (ref 11.5–15.5)
WBC: 8 K/uL (ref 4.0–10.5)
nRBC: 0 % (ref 0.0–0.2)

## 2023-10-17 LAB — COMPREHENSIVE METABOLIC PANEL WITH GFR
ALT: 1543 U/L — ABNORMAL HIGH (ref 0–44)
AST: 1215 U/L — ABNORMAL HIGH (ref 15–41)
Albumin: 3 g/dL — ABNORMAL LOW (ref 3.5–5.0)
Alkaline Phosphatase: 161 U/L — ABNORMAL HIGH (ref 38–126)
Anion gap: 8 (ref 5–15)
BUN: 47 mg/dL — ABNORMAL HIGH (ref 8–23)
CO2: 23 mmol/L (ref 22–32)
Calcium: 8.1 mg/dL — ABNORMAL LOW (ref 8.9–10.3)
Chloride: 101 mmol/L (ref 98–111)
Creatinine, Ser: 1.9 mg/dL — ABNORMAL HIGH (ref 0.61–1.24)
GFR, Estimated: 35 mL/min — ABNORMAL LOW (ref >60)
Glucose, Bld: 95 mg/dL (ref 70–99)
Potassium: 4.4 mmol/L (ref 3.5–5.1)
Sodium: 132 mmol/L — ABNORMAL LOW (ref 135–145)
Total Bilirubin: 3.9 mg/dL — ABNORMAL HIGH (ref 0.0–1.2)
Total Protein: 5.2 g/dL — ABNORMAL LOW (ref 6.5–8.1)

## 2023-10-17 LAB — HEPATITIS PANEL, ACUTE
HCV Ab: NONREACTIVE
Hep A IgM: NONREACTIVE
Hep B C IgM: NONREACTIVE
Hepatitis B Surface Ag: NONREACTIVE

## 2023-10-17 LAB — LACTIC ACID, PLASMA: Lactic Acid, Venous: 1.2 mmol/L (ref 0.5–1.9)

## 2023-10-17 LAB — PROTIME-INR
INR: 5.4 (ref 0.8–1.2)
Prothrombin Time: 51.4 s — ABNORMAL HIGH (ref 11.4–15.2)

## 2023-10-17 LAB — IGA: IgA: 154 mg/dL (ref 61–437)

## 2023-10-17 LAB — TISSUE TRANSGLUTAMINASE, IGA: Tissue Transglutaminase Ab, IgA: <2 U/mL (ref 0–3)

## 2023-10-17 MED ORDER — HYDROCORTISONE ACETATE 25 MG RE SUPP: 25.0000 mg | Freq: Two times a day (BID) | RECTAL | Status: DC | PRN

## 2023-10-17 MED ORDER — PERFLUTREN LIPID MICROSPHERE
1.0000 mL | INTRAVENOUS | Status: AC | PRN
  Administered 2023-10-17: 3 mL via INTRAVENOUS

## 2023-10-17 MED ORDER — LORAZEPAM 2 MG/ML IJ SOLN: 0.5000 mg | Freq: Four times a day (QID) | INTRAMUSCULAR | Status: DC | PRN

## 2023-10-17 MED ORDER — HALOPERIDOL LACTATE 5 MG/ML IJ SOLN: 2.0000 mg | Freq: Four times a day (QID) | INTRAMUSCULAR | Status: DC | PRN

## 2023-10-17 MED ORDER — AMITRIPTYLINE HCL 10 MG PO TABS
10.0000 mg | ORAL_TABLET | Freq: Every day | ORAL | Status: DC
  Administered 2023-10-18 – 2023-10-19 (×2): 10 mg via ORAL
  Filled 2023-10-17 (×3): qty 1

## 2023-10-17 MED ORDER — DEXTROSE-SODIUM CHLORIDE 5-0.9 % IV SOLN
INTRAVENOUS | Status: DC
  Administered 2023-10-17: 1000 mL via INTRAVENOUS

## 2023-10-17 NOTE — Progress Notes (Signed)
 Jason Day  FMW:[id number] DOB: [birth year] DOA: 10/14/2023 PCP: [name], PA-C    Brief Narrative:  83 year old with a history of memory loss, CAD, chronic systolic CHF, atrial fibrillation on warfarin, CKD stage IIIa, heart block status post pacemaker, sigmoid diverticulosis, hemorrhoids, and IBS who presented to the ER 8/25 with rectal bleeding described as bright red blood per rectum since 8/23 associated with loss of appetite increased fatigue and exertional dyspnea.  Goals of Care:   Code Status: Full Code   DVT prophylaxis: SCDs Start: 10/15/23 0056   Interim Hx: No acute events recorded overnight.  Afebrile.  Vital signs stable.  Hemoglobin stable.  Lactic acidosis has resolved.  The patient has become agitated and confused today.  He has refused multiple chest and radiologic evaluations.  At the time of my evaluation I was able to calm him down by speaking to him at length but he is clearly suffering with acute delirium and does not fully understand the significance of his acute illness or the need to evaluate it further.  Assessment & Plan:  Transaminitis LFTs found to be markedly elevated today with AST and ALT of 1215 and 1543 respectively -etiology unclear -check Tylenol  level -check viral hepatitis studies - LFTs normal June 2025 -perhaps simple shock liver -follow trend - I have asked GI to comment as well   Recent Labs  Lab 10/17/23 0522  AST 1,215*  ALT 1,543*  ALKPHOS 161*  BILITOT 3.9*  PROT 5.2*  ALBUMIN  3.0*    Supratherapeutic INR INR 4.0 at presentation and increased to peak of 9.0 during admission -warfarin on hold -dosed with vitamin K x 1 - INR slowly improving -appears to be due to acute hepatitis of unclear etiology at present  Memory impairment/dementia - acute delirium Multifactorial related to underlying cognitive impairment complicated by hospitalization and acute illness - CT head without acute findings - check ammonia level - pt DOES NOT  possess the competence to decide to leave the hospital AMA  Severe somnolence/lethargy Possibly related to Elavil  being used for treatment of IBS - Elavil  dose has been decreased - given elevated LFTs I am checking ammonia level as well   Chronic persistent weight loss Followed by GI in outpatient setting with decrease in weight from 240 pounds to 140 pounds unintentionally over a 6-year timeframe - CT chest suggested during this admission  IBS Long history of complicated, difficult to manage IBS - longstanding loose stools - see GI note for history - to consider a trial of citrucel   AKI on CKD stage IIIa Creatinine 2.69 on admission - baseline creatinine approximately 1.2 - holding Aldactone  valsartan  and Farxiga  - renal US  without acutely reversible issues and consistent with medical renal disease - renal function appears to be steadily improving with consistent volume resuscitation and avoidance of possible nephrotoxic drugs  Recent Labs  Lab 10/14/23 1623 10/15/23 0423 10/15/23 0949 10/16/23 0301 10/17/23 0522  CREATININE 2.28* 2.69* 2.65* 2.68* 1.90*    BRBPR - suspected hemorrhoidal bleeding Has remained hemodynamically stable - hemoglobin slightly decreased from 13.4 presentation to 11.4 - colonoscopy 08/04/2019 noted internal hemorrhoids - cont with Anusol  suppositories - warfarin on hold - this issues has essentially resolved at this time   Hyperkalemia Received acute temporizing measures as well as Lokelma  initially - stable presently  Lactic acidosis Appears to have been simply due to hypovolemia - resolved with volume expansion  Atrial fibrillation on chronic warfarin  Rate controlled - continue metoprolol  - holding warfarin  for now given acute liver disease  Chronic systolic CHF -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% via TTE 03/30/2023 - no volume overload at present  Complete heart block status post PPM  CAD status post CABG Asymptomatic  Prolonged QTc QTc 521 ms  during this admission - care with medication selection - repeat EKG now that patient hydrated and electrolytes balanced   Chronic thrombocytopenia Platelet count trending downward -no evidence of active bleeding presently -monitor trend  Underweight - Body mass index is 17.28 kg/m.   Family Communication: No family present at time of exam Disposition: unclear presently    Objective: Blood pressure 103/63, pulse 93, temperature 97.6 F (36.4 C), resp. rate 17, height 6' 1 (1.854 m), weight 59.4 kg, SpO2 97%.  Intake/Output Summary (Last 24 hours) at 10/17/2023 0903 Last data filed at 10/16/2023 1802 Gross per 24 hour  Intake 653.82 ml  Output 400 ml  Net 253.82 ml   Filed Weights   10/14/23 1610  Weight: 59.4 kg    Examination: General: No acute respiratory distress Lungs: Clear to auscultation bilaterally without wheezes or crackles Cardiovascular: Regular rate and rhythm without murmur gallop or rub normal S1 and S2 Abdomen: Nontender, nondistended, soft, bowel sounds positive, no rebound, no ascites, no appreciable mass Extremities: No significant cyanosis, clubbing, or edema bilateral lower extremities  CBC: Recent Labs  Lab 10/14/23 1623 10/15/23 0423 10/15/23 1352 10/16/23 0301 10/17/23 0522  WBC 8.6   < > 10.2 10.9* 8.0  NEUTROABS 6.9  --   --   --   --   HGB 13.4   < > 11.4* 12.3* 12.1*  HCT 41.4   < > 34.6* 37.1* 35.8*  MCV 99.0   < > 98.0 97.4 96.0  PLT 128*   < > 72* 76* 65*   < > = values in this interval not displayed.   Basic Metabolic Panel: Recent Labs  Lab 10/15/23 0949 10/16/23 0301 10/17/23 0522  NA 128* 130* 132*  K 4.9 4.9 4.4  CL 93* 96* 101  CO2 18* 18* 23  GLUCOSE 146* 106* 95  BUN 53* 56* 47*  CREATININE 2.65* 2.68* 1.90*  CALCIUM  8.5* 8.4* 8.1*  MG 2.5*  --   --    GFR: Estimated Creatinine Clearance: 25.2 mL/min (A) (by C-G formula based on SCr of 1.9 mg/dL (H)).   Scheduled Meds:  amitriptyline   25 mg Oral QHS    hydrocortisone   25 mg Rectal BID   lactose free nutrition  237 mL Oral TID WC   metoprolol  succinate  12.5 mg Oral Daily   sodium chloride  flush  3 mL Intravenous Q12H   venlafaxine  XR  37.5 mg Oral Q breakfast      LOS: 3 days   Reyes IVAR Moores, MD [facility] Office  [phone] Pager - Text Page per Tracey  If 7PM-7AM, please contact night-coverage per Amion 10/17/2023, 9:03 AM

## 2023-10-17 NOTE — Progress Notes (Incomplete)
 Patient ID: Jason Day, male   DOB: Oct 21, 1940, 83 y.o.   MRN: [id number]    Progress Note   Subjective   Day # 3 CC; initial complaint rectal bleeding felt secondary to prolapsing hemorrhoid Now with marked transaminitis and persistently supratherapeutic INR  Renal ultrasound 8/26-bilateral echogenic kidneys may represent sequelae of medical renal disease, right pleural effusion  Pro time 51.4/INR 5.4 Sodium 132/potassium 4.4/BUN 47/creatinine 1.9 T. bili 3.9/alk phos 161/AST 1215/ALT 1543 t- not checked on admit Lactate improved to 1.2 CK normal on 10/15/2023 TTG IgA pending   Objective   Vital signs in last 24 hours: Temp:  [97.6 F (36.4 C)-98.3 F (36.8 C)] 97.6 F (36.4 C) (08/28 0834) Pulse Rate:  [79-93] 93 (08/28 0834) Resp:  [16-18] 17 (08/28 0834) BP: (93-105)/(63-74) 103/63 (08/28 0834) SpO2:  [90 %-97 %] 97 % (08/28 0834) Last BM Date :  (unknown) General:    white male in NAD Heart:  Regular rate and rhythm; no murmurs Lungs: Respirations even and unlabored, lungs CTA bilaterally Abdomen:  Soft, nontender and nondistended. Normal bowel sounds. Extremities:  Without edema. Neurologic:  Alert and oriented,  grossly normal neurologically. Psych:  Cooperative. Normal mood and affect.  Intake/Output from previous day: 08/27 0701 - 08/28 0700 In: 890.8 [P.O.:474; I.V.:416.8] Out: 400 [Urine:400] Intake/Output this shift: Total I/O In: 840 [P.O.:840] Out: -   Lab Results: Recent Labs    10/15/23 1352 10/16/23 0301 10/17/23 0522  WBC 10.2 10.9* 8.0  HGB 11.4* 12.3* 12.1*  HCT 34.6* 37.1* 35.8*  PLT 72* 76* 65*   BMET Recent Labs    10/15/23 0949 10/16/23 0301 10/17/23 0522  NA 128* 130* 132*  K 4.9 4.9 4.4  CL 93* 96* 101  CO2 18* 18* 23  GLUCOSE 146* 106* 95  BUN 53* 56* 47*  CREATININE 2.65* 2.68* 1.90*  CALCIUM  8.5* 8.4* 8.1*   LFT Recent Labs    10/17/23 0522  PROT 5.2*  ALBUMIN  3.0*  AST 1,215*  ALT 1,543*  ALKPHOS 161*   BILITOT 3.9*   PT/INR Recent Labs    10/16/23 1533 10/17/23 0522  LABPROT 59.9* 51.4*  INR 6.5* 5.4*    Studies/Results: US  RENAL Result Date: 10/15/2023 CLINICAL DATA:  Acute kidney injury. EXAM: RENAL / URINARY TRACT ULTRASOUND COMPLETE COMPARISON:  April 24, 2017 FINDINGS: Right Kidney: Renal measurements: 10.4 cm x 4.3 cm x 4.0 cm = volume: 92.93 mL. Diffusely increased echogenicity of the renal parenchyma is seen. No mass or hydronephrosis visualized. Left Kidney: Renal measurements: 9.6 cm x 3.9 cm x 3.8 cm = volume: 72.94 mL. Diffusely increased echogenicity of the renal parenchyma is seen. No mass or hydronephrosis visualized. Bladder: Appears normal for degree of bladder distention. Other: Of incidental note is the presence of a right pleural effusion. IMPRESSION: 1. Bilateral echogenic kidneys which may represent sequelae associated with medical renal disease. 2. Right pleural effusion. Electronically Signed   By: Suzen Dials M.D.   On: 10/15/2023 23:33   CT HEAD WO CONTRAST ( ) Result Date: 10/15/2023 EXAM: CT HEAD WITHOUT CONTRAST 10/15/2023 02:40:00 PM TECHNIQUE: CT of the head was performed without the administration of intravenous contrast. Automated exposure control, iterative reconstruction, and/or weight based adjustment of the mA/kV was utilized to reduce the radiation dose to as low as reasonably achievable. COMPARISON: 02/11/2022 CLINICAL HISTORY: Mental status change, unknown cause. FINDINGS: BRAIN AND VENTRICLES: No acute hemorrhage. Gray-white differentiation is preserved. No hydrocephalus. No extra-axial collection. No mass effect or midline shift. Patchy  cerebral white matter hypodensities, similar to the prior CT and nonspecific but compatible with mild-to-moderate chronic small vessel ischemic disease. Mild-to-moderate cerebral atrophy. 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 ORBITS: Bilateral cataract extraction. SINUSES: No acute  abnormality. SOFT TISSUES AND SKULL: No acute soft tissue abnormality. No skull fracture. Calcified atherosclerosis at the skull base. IMPRESSION: 1. No acute intracranial abnormality. 2. Mild-to-moderate chronic small vessel ischemic disease and cerebral atrophy. 3. Small chronic left occipital infarct. Electronically signed by: Dasie Hamburg MD 10/15/2023 04:18 PM EDT RP Workstation: [id number]       Assessment / Plan:         Principal Problem:   Rectal bleeding Active Problems:   Coronary artery disease   Permanent atrial fibrillation (HCC)   Hemorrhoids   Memory impairment   Supratherapeutic INR   Chronic heart failure with mildly reduced ejection fraction (HFmrEF, 41-49%) (HCC)   Acute renal failure superimposed on stage 3a chronic kidney disease (HCC)     LOS: 3 days   Evita Merida PA-C 10/17/2023, 1:36 PM

## 2023-10-17 NOTE — Progress Notes (Signed)
 Progress Note   Subjective  Rectal bleeding resolved, doing better from that perspective however liver enzymes elevated today.  We will call to reevaluate for that issue.  He denies any abdominal pain.  He wonders why he is still in the hospital and asking to go home.   Objective   Vital signs in last 24 hours: Temp:  [97.6 F (36.4 C)-98.3 F (36.8 C)] 97.6 F (36.4 C) (08/28 0834) Pulse Rate:  [79-93] 93 (08/28 0834) Resp:  [16-18] 17 (08/28 0834) BP: (93-105)/(63-74) 103/63 (08/28 0834) SpO2:  [90 %-97 %] 97 % (08/28 0834) Last BM Date :  (unknown) General:    white male in NAD Neurologic:  Alert and oriented,  grossly normal neurologically. Psych:  Cooperative. Normal mood and affect.  Intake/Output from previous day: 08/27 0701 - 08/28 0700 In: 890.8 [P.O.:474; I.V.:416.8] Out: 400 [Urine:400] Intake/Output this shift: Total I/O In: 840 [P.O.:840] Out: -   Lab Results: Recent Labs    10/15/23 1352 10/16/23 0301 10/17/23 0522  WBC 10.2 10.9* 8.0  HGB 11.4* 12.3* 12.1*  HCT 34.6* 37.1* 35.8*  PLT 72* 76* 65*   BMET Recent Labs    10/15/23 0949 10/16/23 0301 10/17/23 0522  NA 128* 130* 132*  K 4.9 4.9 4.4  CL 93* 96* 101  CO2 18* 18* 23  GLUCOSE 146* 106* 95  BUN 53* 56* 47*  CREATININE 2.65* 2.68* 1.90*  CALCIUM  8.5* 8.4* 8.1*   LFT Recent Labs    10/17/23 0522  PROT 5.2*  ALBUMIN  3.0*  AST 1,215*  ALT 1,543*  ALKPHOS 161*  BILITOT 3.9*   PT/INR Recent Labs    10/16/23 1533 10/17/23 0522  LABPROT 59.9* 51.4*  INR 6.5* 5.4*    Studies/Results: US  RENAL Result Date: 10/15/2023 CLINICAL DATA:  Acute kidney injury. EXAM: RENAL / URINARY TRACT ULTRASOUND COMPLETE COMPARISON:  April 24, 2017 FINDINGS: Right Kidney: Renal measurements: 10.4 cm x 4.3 cm x 4.0 cm = volume: 92.93 mL. Diffusely increased echogenicity of the renal parenchyma is seen. No mass or hydronephrosis visualized. Left Kidney: Renal measurements: 9.6 cm x 3.9 cm x  3.8 cm = volume: 72.94 mL. Diffusely increased echogenicity of the renal parenchyma is seen. No mass or hydronephrosis visualized. Bladder: Appears normal for degree of bladder distention. Other: Of incidental note is the presence of a right pleural effusion. IMPRESSION: 1. Bilateral echogenic kidneys which may represent sequelae associated with medical renal disease. 2. Right pleural effusion. Electronically Signed   By: Suzen Dials M.D.   On: 10/15/2023 23:33   CT HEAD WO CONTRAST ( ) Result Date: 10/15/2023 EXAM: CT HEAD WITHOUT CONTRAST 10/15/2023 02:40:00 PM TECHNIQUE: CT of the head was performed without the administration of intravenous contrast. Automated exposure control, iterative reconstruction, and/or weight based adjustment of the mA/kV was utilized to reduce the radiation dose to as low as reasonably achievable. COMPARISON: 02/11/2022 CLINICAL HISTORY: Mental status change, unknown cause. FINDINGS: BRAIN AND VENTRICLES: No acute hemorrhage. Gray-white differentiation is preserved. No hydrocephalus. No extra-axial collection. No mass effect or midline shift. Patchy cerebral white matter hypodensities, similar to the prior CT and nonspecific but compatible with mild-to-moderate chronic small vessel ischemic disease. Mild-to-moderate cerebral atrophy. 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 ORBITS: Bilateral cataract extraction. SINUSES: No acute abnormality. SOFT TISSUES AND SKULL: No acute soft tissue abnormality. No skull fracture. Calcified atherosclerosis at the skull base. IMPRESSION: 1. No acute intracranial abnormality. 2. Mild-to-moderate chronic small vessel ischemic  disease and cerebral atrophy. 3. Small chronic left occipital infarct. Electronically signed by: Dasie Hamburg MD 10/15/2023 04:18 PM EDT RP Workstation: [id number]       Assessment / Plan:    83 y/o male here with the following:  Transaminitis AKI Rectal bleeding secondary to  hemorrhoids IBS  Had some nonspecific symptoms and rectal bleeding which led to admission.  INR supratherapeutic on admission.  Rectal bleeding very likely due to hemorrhoids, has resolved with Anusol .  Has been having workup for AKI, holding Aldactone , valsartan , Farxiga , renal ultrasound negative.  This appears to be improving.  What is noted today is that he has a significant transaminitis with AST and ALT greater than 1000.  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  This is also contributing to elevated INR although unclear what the true value is in the setting of Coumadin .  He has had vitamin K given and his INR is improving.  His mental status appears at baseline and had a good conversation with him today. No obvious encephalopathy. I reiterated the reasoning to keep him in the hospital due to his kidney and liver function and he understands.  To workup his transaminitis, recommend initial serologic workup, rule out acute infection, auto immune, etc.  Also recommend Doppler ultrasound of the liver and repeating an echocardiogram in light of both renal and liver dysfunction.  DILI also on ddx.  He denies any new medications recently or antibiotics.  Lipitor held.  Trend LFTs and INR for now, await imaging studies.  Clinically he looks better, suspect transaminitis could have been worse initially and hopefully downtrending now.  His baseline LFTs were normal back in June.  Of note, Elavil  also dose reduced in light of his somnolence, may be good to discontinue over time in light of his age and comorbidities.  We will reassess him tomorrow, call with questions in the interim.  Marcey Naval, MD [facility]

## 2023-10-17 NOTE — Telephone Encounter (Signed)
 Inpt team is seeing pt.

## 2023-10-17 NOTE — Telephone Encounter (Signed)
 Inbound call from patient spouse. States patient is currently admitted into [facility] and is refusing provider assistance. Requesting someone give a call to patient as she states he will listen to provider and nurse. Wife is in distress and she states patient is trying to ;eave the hospital and he really needs to be seen. Please advise.   Thank you

## 2023-10-17 NOTE — Progress Notes (Signed)
  Echocardiogram 2D Echocardiogram has been performed.  Juliene JINNY Rucks 10/17/2023, 5:34 PM

## 2023-10-17 NOTE — Plan of Care (Signed)
   Problem: Safety: Goal: Ability to remain free from injury will improve Outcome: Progressing   Problem: Skin Integrity: Goal: Risk for impaired skin integrity will decrease Outcome: Progressing

## 2023-10-17 NOTE — Care Management Important Message (Signed)
 Important Message  Patient Details  Name: Jason Day MRN: [id number] Date of Birth: 07/24/1940   Important Message Given:  Yes - Medicare IM     Jon Cruel 10/17/2023, 2:56 PM

## 2023-10-18 ENCOUNTER — Telehealth (HOSPITAL_COMMUNITY): Payer: Self-pay

## 2023-10-18 ENCOUNTER — Ambulatory Visit: Admitting: Gastroenterology

## 2023-10-18 ENCOUNTER — Other Ambulatory Visit (HOSPITAL_COMMUNITY): Payer: Self-pay

## 2023-10-18 DIAGNOSIS — I5023 Acute on chronic systolic (congestive) heart failure: Secondary | ICD-10-CM | POA: Diagnosis not present

## 2023-10-18 DIAGNOSIS — R7401 Elevation of levels of liver transaminase levels: Secondary | ICD-10-CM | POA: Diagnosis not present

## 2023-10-18 DIAGNOSIS — N1832 Chronic kidney disease, stage 3b: Secondary | ICD-10-CM

## 2023-10-18 DIAGNOSIS — I5082 Biventricular heart failure: Secondary | ICD-10-CM | POA: Diagnosis not present

## 2023-10-18 DIAGNOSIS — N179 Acute kidney failure, unspecified: Secondary | ICD-10-CM | POA: Diagnosis not present

## 2023-10-18 DIAGNOSIS — I340 Nonrheumatic mitral (valve) insufficiency: Secondary | ICD-10-CM

## 2023-10-18 DIAGNOSIS — K625 Hemorrhage of anus and rectum: Secondary | ICD-10-CM | POA: Diagnosis not present

## 2023-10-18 DIAGNOSIS — I361 Nonrheumatic tricuspid (valve) insufficiency: Secondary | ICD-10-CM

## 2023-10-18 DIAGNOSIS — I5022 Chronic systolic (congestive) heart failure: Secondary | ICD-10-CM | POA: Diagnosis not present

## 2023-10-18 LAB — COMPREHENSIVE METABOLIC PANEL WITH GFR
ALT: 1052 U/L — ABNORMAL HIGH (ref 0–44)
AST: 530 U/L — ABNORMAL HIGH (ref 15–41)
Albumin: 2.8 g/dL — ABNORMAL LOW (ref 3.5–5.0)
Alkaline Phosphatase: 168 U/L — ABNORMAL HIGH (ref 38–126)
Anion gap: 9 (ref 5–15)
BUN: 35 mg/dL — ABNORMAL HIGH (ref 8–23)
CO2: 24 mmol/L (ref 22–32)
Calcium: 8.1 mg/dL — ABNORMAL LOW (ref 8.9–10.3)
Chloride: 102 mmol/L (ref 98–111)
Creatinine, Ser: 1.52 mg/dL — ABNORMAL HIGH (ref 0.61–1.24)
GFR, Estimated: 45 mL/min — ABNORMAL LOW (ref >60)
Glucose, Bld: 118 mg/dL — ABNORMAL HIGH (ref 70–99)
Potassium: 4.4 mmol/L (ref 3.5–5.1)
Sodium: 135 mmol/L (ref 135–145)
Total Bilirubin: 3.7 mg/dL — ABNORMAL HIGH (ref 0.0–1.2)
Total Protein: 5.1 g/dL — ABNORMAL LOW (ref 6.5–8.1)

## 2023-10-18 LAB — ANTI-SMOOTH MUSCLE ANTIBODY, IGG: F-Actin IgG: 9 U (ref 0–19)

## 2023-10-18 LAB — CBC
HCT: 35.9 % — ABNORMAL LOW (ref 39.0–52.0)
Hemoglobin: 11.9 g/dL — ABNORMAL LOW (ref 13.0–17.0)
MCH: 32.2 pg (ref 26.0–34.0)
MCHC: 33.1 g/dL (ref 30.0–36.0)
MCV: 97 fL (ref 80.0–100.0)
Platelets: 76 K/uL — ABNORMAL LOW (ref 150–400)
RBC: 3.7 MIL/uL — ABNORMAL LOW (ref 4.22–5.81)
RDW: 14.4 % (ref 11.5–15.5)
WBC: 6.9 K/uL (ref 4.0–10.5)
nRBC: 0 % (ref 0.0–0.2)

## 2023-10-18 LAB — HEPATITIS PANEL, ACUTE
HCV Ab: NONREACTIVE
Hep A IgM: NONREACTIVE
Hep B C IgM: NONREACTIVE
Hepatitis B Surface Ag: NONREACTIVE

## 2023-10-18 LAB — ACETAMINOPHEN LEVEL: Acetaminophen (Tylenol), Serum: <10 ug/mL — ABNORMAL LOW (ref 10–30)

## 2023-10-18 LAB — CMV IGM: CMV IgM: <30 [AU]/ml (ref 0.0–29.9)

## 2023-10-18 LAB — AMMONIA: Ammonia: 31 umol/L (ref 9–35)

## 2023-10-18 LAB — PROTIME-INR
INR: 3.9 — ABNORMAL HIGH (ref 0.8–1.2)
Prothrombin Time: 39.7 s — ABNORMAL HIGH (ref 11.4–15.2)

## 2023-10-18 LAB — ANA W/REFLEX IF POSITIVE: Anti Nuclear Antibody (ANA): NEGATIVE

## 2023-10-18 MED ORDER — METOPROLOL SUCCINATE ER 25 MG PO TB24
12.5000 mg | ORAL_TABLET | Freq: Every day | ORAL | Status: DC
Start: 2023-10-18 — End: 2023-10-21
  Filled 2023-10-18 (×2): qty 1

## 2023-10-18 MED ORDER — FUROSEMIDE 10 MG/ML IJ SOLN
80.0000 mg | Freq: Two times a day (BID) | INTRAMUSCULAR | Status: AC
  Administered 2023-10-18 (×2): 80 mg via INTRAVENOUS
  Filled 2023-10-18 (×2): qty 8

## 2023-10-18 NOTE — Progress Notes (Addendum)
 Jason Day  FMW:[id number] DOB: 1940-06-10 DOA: 10/14/2023 PCP: [name], PA-C    Brief Narrative:  83 year old with a history of memory loss, CAD, chronic systolic CHF, atrial fibrillation on warfarin, CKD stage IIIa, heart block status post pacemaker, sigmoid diverticulosis, hemorrhoids, and IBS who presented to the ER 8/25 with rectal bleeding described as bright red blood per rectum since 8/23 associated with loss of appetite increased fatigue and exertional dyspnea.  Goals of Care:   Code Status: Full Code   DVT prophylaxis: SCDs Start: 10/15/23 0056   Interim Hx: No acute events recorded since the time of my last visit.  Afebrile.  Vital signs stable.  LFTs trending downward.  Renal function continues to improve.  INR trending downward.  Reports improved appetite today.  Denies chest pain or shortness of breath.  Assessment & Plan:  Transaminitis LFTs found to be markedly elevated 8/28 with AST and ALT of 1215 and 1543 respectively -etiology unclear - delayed Tylenol  level unremarkable - viral hepatitis studies negative - LFTs normal June 2025 - liver Doppler is unremarkable -liver ultrasound reveals lobulated morphology suggestive of possible cirrhosis but no focal hepatic abnormality -the current suspicion is that this is liver failure related to right heart failure as well as shock liver in the setting of severe systolic CHF and bouts of hypotension at home -no further GI workup planned for now with intention of managing heart failure and monitoring liver function for improvement  Recent Labs  Lab 10/17/23 0522 10/18/23 0316  AST 1,215* 530*  ALT 1,543* 1,052*  ALKPHOS 161* 168*  BILITOT 3.9* 3.7*  PROT 5.2* 5.1*  ALBUMIN  3.0* 2.8*    Supratherapeutic INR INR 4.0 at presentation and increased to peak of 9.0 during admission -warfarin on hold -dosed with vitamin K x 1 - INR slowly improving -appears to be due to acute hepatitis as noted above - now consistently  trending downward  Acute worsening of Chronic systolic CHF - 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% via TTE 03/30/2023 - TTE this admit notes signif worsening of EF to 20-25% w/ global hypokinesis w/ severe mitral and tricuspid regurgitation - no volume overload at present - will invite CHF Team to consult as patient is followed in their clinic   Memory impairment/dementia - acute delirium Multifactorial related to underlying cognitive impairment complicated by hospitalization and acute illness - CT head without acute findings - ammonia level unremarkable - pt DOES NOT possess the competence to decide to leave the hospital AMA  Severe somnolence/lethargy Possibly related to Elavil  being used for treatment of IBS - Elavil  is being weaned to off - ammonia level normal    Chronic persistent weight loss Followed by GI in outpatient setting with decrease in weight from 240 pounds to 140 pounds unintentionally over a 6-year timeframe - CT chest suggested during this admission - 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   IBS Long history of complicated, difficult to manage IBS - longstanding loose stools - see GI note for history - to consider a trial of citrucel   AKI on CKD stage IIIa Creatinine 2.69 on admission - baseline creatinine approximately 1.2 - held Aldactone  valsartan  and Farxiga  initially due to hypotension - renal US  without acutely reversible issues and consistent with medical renal disease - renal function steadily improved w/ volume resuscitation and avoidance of possible nephrotoxic drugs, but pt has rapidly transitioned to volume overloaded state - follow renal fxn w/ initiation of lasix  today   Recent Labs  Lab 10/15/23 0423  10/15/23 0949 10/16/23 0301 10/17/23 0522 10/18/23 0316  CREATININE 2.69* 2.65* 2.68* 1.90* 1.52*    BRBPR - suspected hemorrhoidal bleeding Has remained hemodynamically stable - hemoglobin slightly decreased from 13.4 presentation to 11.4 - colonoscopy  08/04/2019 noted internal hemorrhoids - cont with Anusol  suppositories - warfarin on hold - this issues has essentially resolved at this time   Hyperkalemia Received acute temporizing measures as well as Lokelma  initially - stable presently  Lactic acidosis Appears to have been simply due to hypovolemia - resolved with volume expansion  Atrial fibrillation on chronic warfarin  Rate controlled - continue metoprolol  - holding warfarin for now given acute liver disease - given overall picture I feel he is no longer an appropriate candidate for anticoag due to a high risk of bleeding complications/injury   Complete heart block status post PPM  CAD status post CABG x5 2018 Asymptomatic  Prolonged QTc QTc 521 ms during this admission - care with medication selection - repeat EKG now that patient hydrated and electrolytes balanced   Chronic thrombocytopenia Platelet count trending downward -no evidence of active bleeding presently -monitor trend  Underweight - Body mass index is 17.28 kg/m.   Family Communication: spoke with wife via phone at length Disposition: unclear presently    Objective: Blood pressure (!) 113/91, pulse 93, temperature 97.8 F (36.6 C), temperature source Oral, resp. rate 18, height 6' 1 (1.854 m), weight 59.4 kg, SpO2 96%.  Intake/Output Summary (Last 24 hours) at 10/18/2023 0820 Last data filed at 10/18/2023 0510 Gross per 24 hour  Intake 1497.88 ml  Output --  Net 1497.88 ml   Filed Weights   10/14/23 1610  Weight: 59.4 kg    Examination: General: No acute respiratory distress Lungs: Clear to auscultation bilaterally without wheezes or crackles Cardiovascular: Regular rate and rhythm without murmur gallop or rub normal S1 and S2 Abdomen: Nontender, nondistended, soft, bowel sounds positive, no rebound, no ascites, no appreciable mass Extremities: 1+ B LE edema - dependent edema of thighs/sacrum   CBC: Recent Labs  Lab 10/14/23 1623  10/15/23 0423 10/16/23 0301 10/17/23 0522 10/18/23 0316  WBC 8.6   < > 10.9* 8.0 6.9  NEUTROABS 6.9  --   --   --   --   HGB 13.4   < > 12.3* 12.1* 11.9*  HCT 41.4   < > 37.1* 35.8* 35.9*  MCV 99.0   < > 97.4 96.0 97.0  PLT 128*   < > 76* 65* 76*   < > = values in this interval not displayed.   Basic Metabolic Panel: Recent Labs  Lab 10/15/23 0949 10/16/23 0301 10/17/23 0522 10/18/23 0316  NA 128* 130* 132* 135  K 4.9 4.9 4.4 4.4  CL 93* 96* 101 102  CO2 18* 18* 23 24  GLUCOSE 146* 106* 95 118*  BUN 53* 56* 47* 35*  CREATININE 2.65* 2.68* 1.90* 1.52*  CALCIUM  8.5* 8.4* 8.1* 8.1*  MG 2.5*  --   --   --    GFR: Estimated Creatinine Clearance: 31.5 mL/min (A) (by C-G formula based on SCr of 1.52 mg/dL (H)).   Scheduled Meds:  amitriptyline   10 mg Oral QHS   lactose free nutrition  237 mL Oral TID WC   metoprolol  succinate  12.5 mg Oral Daily   sodium chloride  flush  3 mL Intravenous Q12H   venlafaxine  XR  37.5 mg Oral Q breakfast      LOS: 4 days   Reyes IVAR Moores, MD [facility] Office  [phone] Pager - Text Page per Tracey  If 7PM-7AM, please contact night-coverage per Amion 10/18/2023, 8:20 AM

## 2023-10-18 NOTE — Consult Note (Signed)
 Advanced Heart Failure Team Consult Note   Primary Physician: [name], PA-C Cardiologist:  Maude Emmer, MD AHF: Dr. Rolan   Reason for Consultation: acute on chronic systolic heart failure, worsening biventricular failure   HPI:    Jason Day is seen today for evaluation of acute on chronic systolic heart failure at the request of Dr. Danton, Internal Medicine.   83 y.o. with history of CAD s/p CABG, ischemic cardiomyopathy, and permanent atrial fibrillation. Patient had CABG x 5 in 8/18.  Post-op echo showed EF 35%. He had post-op atrial fibrillation.  He failed amiodarone  and cardioversion, and it was decided not to pursue AF ablation.  He is in permanent atrial fibrillation.  In 12/23, he had a presyncopal episode.  Zio monitor was placed showing AF rate ranging 29 - 100 bpm, 46 pauses with the longest 4 seconds (at night). Also 28 NSVT runs, longest 18 beats.   He was admitted in 2/25 with bradycardia, suspected atrial fibrillation with complete heart block.  Biotronik PPM with left bundle lead was placed (he had not wanted ICD in past and now of advanced age). Echo in 2/25, prior to implant, showed EF up to 45-50%, basal inferior aneurysm, RV low normal function, severe LAE, IVC normal, moderate MR (possibly infarct-related).    He presented this admission w/ rectal bleeding. GI has seen. Felt 2/2 hemorrhoids. Hgb 12.7. INR supratherapeutic on admit at 9.0. Labs showed significant transaminitis with AST and ALT greater than 1000. INR reversed w/ Vit K. Also w/ AKI, SCr 2.68 on admit (baseline ~1.6)   Echo was done yesterday and showed marked drop in LVEF since previous study 6 months ago. LVEF now 20-25% w/ global HK, mod concentric LVH. 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 IVC dilated, assumed RAP ~15 mmgHg. Given drop in EF, AHF team asked to weigh in.   On exam, pt it resting comfortably.  Denies resting dyspnea but admits to some mid dyspnea and fatigue w/ exertion. No chest pain. No further gross bleeding.   AKI improving, SCr down 1.52 today. Hgb 11.9, INR 3.9. LFTs remain elevated but trending down, AST 1215>>530, ALT 1543>>1052, Tbili 3.9.     Echo 10/17/23 1. Left ventricular ejection fraction, by estimation, is 20 to 25%. The  left ventricle has severely decreased function. The left ventricle  demonstrates global hypokinesis. There is moderate concentric left  ventricular hypertrophy. The basal septum is  aneurysmal (unchanged from prior study).   2. Right ventricular systolic function is moderately reduced. The right  ventricular size is normal.   3. Left atrial size was severely dilated.   4. Right atrial size was severely dilated.   5. Large pleural effusion in the left lateral region.   6. The mitral valve is grossly normal. Severe mitral valve regurgitation.   7. Tricuspid valve regurgitation is severe.   8. The aortic valve is calcified. There is severe calcifcation of the  aortic valve. Aortic valve regurgitation is not visualized. Aortic valve  area, by VTI measures 1.47 cm. Aortic valve mean gradient measures 7.0  mmHg. Aortic valve Vmax measures 1.78  m/s.   9. Pulmonic valve regurgitation is moderate.  10. The inferior vena cava is dilated in size with <50% respiratory  variability, suggesting right atrial pressure of 15 mmHg.   Home Medications Prior to Admission medications   Medication Sig Start Date End Date Taking? Authorizing Provider  amitriptyline  (ELAVIL ) 25 MG tablet Take 2 tablets (50 mg total) by mouth at bedtime. Patient taking differently: Take 25 mg by mouth at bedtime. 07/23/23  Yes Stacia Glendia BRAVO, MD  atorvastatin  (LIPITOR) 10 MG tablet Take 1 tablet (10 mg total) by mouth daily. Patient taking differently: Take 10 mg by mouth at bedtime. 10/01/23  Yes Delford Maude BROCKS, MD  dapagliflozin  propanediol (FARXIGA ) 10 MG TABS tablet Take  1 tablet (10 mg total) by mouth daily before breakfast. 01/31/23  Yes McLean, Dalton S, MD  desvenlafaxine  (PRISTIQ ) 50 MG 24 hr tablet Take 1 tablet (50 mg total) by mouth daily. 09/27/23  Yes Tysinger, Alm RAMAN, PA-C  metoprolol  succinate (TOPROL -XL) 25 MG 24 hr tablet Take 0.5 tablets (12.5 mg total) by mouth daily. Take 12.5mg  daily 06/25/23  Yes Delford Maude BROCKS, MD  Vitamin D , Ergocalciferol , (DRISDOL ) 1.25 MG (50000 UNIT) CAPS capsule Take 1 capsule (50,000 Units total) by mouth every 7 (seven) days. 07/31/23  Yes Stacia Glendia BRAVO, MD  warfarin (COUMADIN ) 5 MG tablet Take 1-1.5 tablets (5-7.5 mg total) by mouth daily as directed by coumadin  clinic. Patient taking differently: Take 5 mg by mouth daily. 08/12/23  Yes Delford Maude BROCKS, MD  spironolactone  (ALDACTONE ) 25 MG tablet Take 12.5 mg by mouth daily. Patient not taking: Reported on 10/15/2023    [provider]  valsartan  (DIOVAN ) 40 MG tablet Take 0.5 tablets (20 mg total) by mouth daily. Patient not taking: Reported on 10/15/2023 09/24/23   Rolan Ezra RAMAN, MD    Past Medical History: Past Medical History:  Diagnosis Date   Acute combined systolic and diastolic congestive heart failure (HCC) 10/16/2016   Allergic rhinitis 09/01/2020   Anal fissure    Angio-edema    Bleeding hemorrhoids    Chronic combined systolic and diastolic heart failure (HCC)    Chronic sinus complaints    overuses afrin   Coronary atherosclerosis of native coronary artery    Dyspnea    Encounter for medical examination to establish care 04/22/2017   Gallstones    Hyperlipidemia    Irritable bowel syndrome 06/07/2017   Ischemic cardiomyopathy    Left atrial enlargement    Long term (current) use of anticoagulants 12/30/2020   Moderate mitral regurgitation    Myocardial infarction (HCC) 09/2015   Persistent atrial fibrillation (HCC)    RBBB    A- Fib   Shortness of breath 12/29/2020   Urinary incontinence 12/30/2020    Past Surgical  History: Past Surgical History:  Procedure Laterality Date   BACK SURGERY  ~2014   disc repair   CARDIOVERSION N/A 10/24/2018   Procedure: CARDIOVERSION;  Surgeon: Jeffrie Oneil BROCKS, MD;  Location: [facility];  Service: Cardiovascular;  Laterality: N/A;   CHOLECYSTECTOMY     COLONOSCOPY  04/06/2014   Hemorrhoids and diverticulosis performed in [place]    COLONOSCOPY WITH PROPOFOL  N/A 08/04/2019   Procedure: COLONOSCOPY WITH PROPOFOL ;  Surgeon: Lennard Lesta FALCON, MD;  Location: [facility];  Service: Endoscopy;  Laterality: N/A;   CORONARY ARTERY BYPASS GRAFT N/A 10/18/2016   Procedure: CORONARY ARTERY BYPASS GRAFTING (CABG) x five , using left internal mammary artery and right leg greater saphenous vein harvested endoscopically;  Surgeon: Lucas Dorise POUR, MD;  Location: [facility] OR;  Service: Open Heart Surgery;  Laterality: N/A;   ESOPHAGOGASTRODUODENOSCOPY (EGD) WITH PROPOFOL  N/A 08/04/2019   Procedure: ESOPHAGOGASTRODUODENOSCOPY (EGD) WITH PROPOFOL ;  Surgeon: Lennard Lesta FALCON, MD;  Location: [facility];  Service: Endoscopy;  Laterality: N/A;   FOOT  SURGERY     HEMORRHOID BANDING     HEMORRHOID SURGERY     PACEMAKER IMPLANT N/A 04/02/2023   Procedure: PACEMAKER IMPLANT;  Surgeon: Cindie Ole DASEN, MD;  Location: [facility] INVASIVE CV LAB;  Service: Cardiovascular;  Laterality: N/A;   REVERSE SHOULDER ARTHROPLASTY Left 08/21/2018   Procedure: REVERSE SHOULDER ARTHROPLASTY;  Surgeon: Melita Drivers, MD;  Location: [facility]S;  Service: Orthopedics;  Laterality: Left;   RIGHT/LEFT HEART CATH AND CORONARY ANGIOGRAPHY N/A 10/16/2016   Procedure: RIGHT/LEFT HEART CATH AND CORONARY ANGIOGRAPHY;  Surgeon: Claudene Victory ORN, MD;  Location: [facility]AS[facility] CV LAB;  Service: Cardiovascular;  Laterality: N/A;   stents in liver     TEE WITHOUT CARDIOVERSION N/A 10/18/2016   Procedure: TRANSESOPHAGEAL ECHOCARDIOGRAM (TEE);  Surgeon: Lucas Dorise POUR, MD;  Location: [facility] OR;  Service: Open Heart Surgery;  Laterality: N/A;   TRANSURETHRAL  RESECTION OF PROSTATE N/A 11/19/2019   Procedure: CYSTOSCOPY TRANSURETHRAL RESECTION OF THE PROSTATE (TURP);  Surgeon: Watt Rush, MD;  Location: [facility];  Service: Urology;  Laterality: N/A;    Family History: Family History  Problem Relation Age of Onset   COPD Father    Emphysema Father    Healthy Brother    Diabetes Neg Hx    Cancer Neg Hx    Stomach cancer Neg Hx    Colon cancer Neg Hx    Allergic rhinitis Neg Hx    Angioedema Neg Hx    Asthma Neg Hx    Eczema Neg Hx    Urticaria Neg Hx     Social History: 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       Has living will   Wife is health care POA--then son   Would accept resuscitation   No tube feeds if cognitively unaware   Social Drivers of Health   Financial Resource Strain: Low Risk  (12/04/2022)   Overall Financial Resource Strain (CARDIA)    Difficulty of Paying Living Expenses: Not hard at all  Food Insecurity: No Food Insecurity (10/15/2023)   Hunger Vital Sign    Worried About Running Out of Food in the Last Year: Never true    Ran Out of Food in the Last Year: Never true  Transportation Needs: No  Transportation Needs (10/15/2023)   PRAPARE - Administrator, Civil Service (Medical): No    Lack of Transportation (Non-Medical): No  Physical Activity: Insufficiently Active (12/04/2022)   Exercise Vital Sign  Days of Exercise per Week: 3 days    Minutes of Exercise per Session: 30 min  Stress: No Stress Concern Present (12/04/2022)   [employer] of Occupational Health - Occupational Stress Questionnaire    Feeling of Stress : Not at all  Social Connections: Moderately Isolated (10/15/2023)   Social Connection and Isolation Panel    Frequency of Communication with Friends and Family: Never    Frequency of Social Gatherings with Friends and Family: Twice a week    Attends Religious Services: More than 4 times per year    Active Member of Clubs or Organizations: No    Attends Engineer, structural: Never    Marital Status: Married    Allergies:  No Known Allergies  Objective:    Vital Signs:   Temp:  [97.6 F (36.4 C)-97.8 F (36.6 C)] 97.7 F (36.5 C) (08/29 0913) Pulse Rate:  [90-93] 92 (08/29 0913) Resp:  [17-18] 17 (08/29 0913) BP: (95-113)/(62-91) 95/62 (08/29 0913) SpO2:  [96 %-97 %] 97 % (08/29 0913) Last BM Date :  (PTA)  Weight change: Filed Weights   10/14/23 1610  Weight: 59.4 kg    Intake/Output:   Intake/Output Summary (Last 24 hours) at 10/18/2023 1057 Last data filed at 10/18/2023 0900 Gross per 24 hour  Intake 1377.88 ml  Output --  Net 1377.88 ml      Physical Exam   GENERAL: elderly male, NAD Lungs- diminished at the bases  CARDIAC:  JVP elevated to jaw           Irregularly irregular rhythm. 2/6 MR/TR murmur, trace b/l LEE  ABDOMEN: Soft, non-tender, non-distended.  EXTREMITIES: Warm and well perfused.  NEUROLOGIC: No obvious FND   Telemetry   Chronic Afib 90s   EKG    V paced 92 bpm, QRS 186 ms, personally reviewed   Labs   Basic Metabolic Panel: Recent Labs  Lab 10/15/23 0423 10/15/23 0949  10/16/23 0301 10/17/23 0522 10/18/23 0316  NA 129* 128* 130* 132* 135  K 6.3* 4.9 4.9 4.4 4.4  CL 96* 93* 96* 101 102  CO2 14* 18* 18* 23 24  GLUCOSE 79 146* 106* 95 118*  BUN 53* 53* 56* 47* 35*  CREATININE 2.69* 2.65* 2.68* 1.90* 1.52*  CALCIUM  9.0 8.5* 8.4* 8.1* 8.1*  MG  --  2.5*  --   --   --     Liver Function Tests: Recent Labs  Lab 10/17/23 0522 10/18/23 0316  AST 1,215* 530*  ALT 1,543* 1,052*  ALKPHOS 161* 168*  BILITOT 3.9* 3.7*  PROT 5.2* 5.1*  ALBUMIN  3.0* 2.8*   No results for input(s): LIPASE, AMYLASE in the last 168 hours. Recent Labs  Lab 10/18/23 0316  AMMONIA 31    CBC: Recent Labs  Lab 10/14/23 1623 10/15/23 0423 10/15/23 1352 10/16/23 0301 10/17/23 0522 10/18/23 0316  WBC 8.6 11.7* 10.2 10.9* 8.0 6.9  NEUTROABS 6.9  --   --   --   --   --   HGB 13.4 12.7* 11.4* 12.3* 12.1* 11.9*  HCT 41.4 39.4 34.6* 37.1* 35.8* 35.9*  MCV 99.0 99.2 98.0 97.4 96.0 97.0  PLT 128* 98* 72* 76* 65* 76*    Cardiac Enzymes: Recent Labs  Lab 10/15/23 1601  CKTOTAL 245    BNP: BNP (last 3 results) Recent Labs    03/30/23 0222 09/03/23 0931 10/15/23 1548  BNP 1,485.4* 758.3* 894.2*    ProBNP (last 3 results) No results for input(s): PROBNP in the  last 8760 hours.   CBG: Recent Labs  Lab 10/15/23 0758  GLUCAP 88    Coagulation Studies: Recent Labs    10/16/23 0301 10/16/23 1533 10/17/23 0522 10/18/23 0316  LABPROT 76.5* 59.9* 51.4* 39.7*  INR 9.0* 6.5* 5.4* 3.9*     Imaging   US  LIVER DOPPLER Result Date: 10/17/2023 CLINICAL DATA:  Transaminitis EXAM: DUPLEX ULTRASOUND OF LIVER TECHNIQUE: Color and duplex Doppler ultrasound was performed to evaluate the hepatic in-flow and out-flow vessels. COMPARISON:  CT abdomen and pelvis 04/25/2023 FINDINGS: Liver: Diffuse increased parenchymal echotexture suggesting diffuse fatty infiltration. Normal hepatic contour without nodularity. No focal mass lesion. Common bile duct measures 10  mm diameter. This is likely physiologic in the setting of prior cholecystectomy. Main Portal Vein size: 1.26 cm Portal Vein Velocities Main Prox:  26.1 cm/sec Main Mid: 37.8 cm/sec Main Dist:  36.8 cm/sec Right: 36.7 cm/sec Left: 23.1 cm/sec Hepatopetal flow direction is demonstrated throughout the portal veins. Hepatic Vein Velocities Right:  37.8 cm/sec Middle:  42 cm/sec Left:  49.9 cm/sec Hepatofugal flow direction is demonstrated throughout the hepatic veins. IVC: Present and patent with normal respiratory phasicity. Hepatic Artery Velocity:  85.6 cm/sec Splenic Vein Velocity:  13.7 cm/sec Spleen: 12 cm x 6.2 cm x 4.8 cm with a total volume of 188 cm^3 (411 cm^3 is upper limit normal) Portal Vein Occlusion/Thrombus: No Splenic Vein Occlusion/Thrombus: No Ascites: Moderate ascites is present. Varices: None IMPRESSION: Normal spectral and color flow Doppler examination of the liver. Diffuse fatty infiltration of the liver. Electronically Signed   By: Elsie Gravely M.D.   On: 10/17/2023 22:26   US  Abdomen Complete Result Date: 10/17/2023 CLINICAL DATA:  Hepatic failure transaminitis EXAM: ABDOMEN ULTRASOUND COMPLETE COMPARISON:  CT 04/25/2023 FINDINGS: Gallbladder: Nonvisualized, possibly surgically absent, correlate with surgical history. Common bile duct: Diameter: 11.6 mm Liver: Appears slightly echogenic. Slightly lobulated morphology. No focal hepatic abnormality. Portal vein is patent on color Doppler imaging with normal direction of blood flow towards the liver. IVC: No abnormality visualized. Pancreas: Visualized portion unremarkable. Spleen: Upper normal in size at 13 cm Right Kidney: Length: 10.2 cm. Echogenicity within normal limits. No mass or hydronephrosis visualized. Left Kidney: Length: 9.9 cm. Echogenicity within normal limits. No mass or hydronephrosis visualized. Abdominal aorta: No aneurysm visualized. Other findings: Right-sided pleural effusion. Small volume ascites within the abdomen  and pelvis. IMPRESSION: 1. Slightly echogenic liver with slightly lobulated morphology, possible cirrhosis. No focal hepatic abnormality. 2. Nonvisualized gallbladder, possibly surgically absent, correlate with surgical history. Common bile duct dilated up to 11.6 mm could be due to surgical change assuming prior cholecystectomy. If biliary obstruction is a concern, correlation with MRCP could be obtained 3. Small volume ascites. Right-sided pleural effusion. Electronically Signed   By: Luke Bun M.D.   On: 10/17/2023 21:12   ECHOCARDIOGRAM COMPLETE Result Date: 10/17/2023    ECHOCARDIOGRAM REPORT   Patient Name:   RAYMONDO GARCIALOPEZ   Date of Exam: 10/17/2023 Medical Rec #:  [id number]  Height:       73.0 in Accession #:    [id number] Weight:       131.0 lb Date of Birth:  03-02-1940  BSA:          1.798 m Patient Age:    82 years   BP:           103/63 mmHg Patient Gender: M          HR:  90 bpm. Exam Location:  Inpatient Procedure: 2D Echo, Color Doppler and Cardiac Doppler (Both Spectral and Color            Flow Doppler were utilized during procedure). Indications:    CHF  History:        Patient has prior history of Echocardiogram examinations, most                 recent 03/30/2023. CHF, CAD; Arrythmias:Atrial Fibrillation.  Sonographer:    VALENTE, ADAM Referring Phys: [id number] JEFFREY T MCCLUNG IMPRESSIONS  1. Left ventricular ejection fraction, by estimation, is 20 to 25%. The left ventricle has severely decreased function. The left ventricle demonstrates global hypokinesis. There is moderate concentric left ventricular hypertrophy. 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  2. Right ventricular systolic function is moderately reduced. The right ventricular size is normal.  3. Left atrial size was severely dilated.  4. Right atrial size was severely dilated.  5. Large pleural effusion in the left lateral region.  6. The mitral valve is grossly normal. Severe mitral valve regurgitation.  7.  Tricuspid valve regurgitation is severe.  8. The aortic valve is calcified. There is severe calcifcation of the aortic valve. Aortic valve regurgitation is not visualized. Aortic valve area, by VTI measures 1.47 cm. Aortic valve mean gradient measures 7.0 mmHg. Aortic valve Vmax measures 1.78 m/s.  9. Pulmonic valve regurgitation is moderate. 10. The inferior vena cava is dilated in size with <50% respiratory variability, suggesting right atrial pressure of 15 mmHg. FINDINGS  Left Ventricle: Left ventricular ejection fraction, by estimation, is 20 to 25%. The left ventricle has severely decreased function. The left ventricle demonstrates global hypokinesis. The left ventricular internal cavity size was mildly to moderately dilated. There is moderate concentric left ventricular hypertrophy of the basal-septal segment. Left ventricular diastolic function could not be evaluated due to atrial fibrillation. Left ventricular diastolic function could not be evaluated. Right Ventricle: The right ventricular size is normal. No increase in right ventricular wall thickness. Right ventricular systolic function is moderately reduced. Left Atrium: Left atrial size was severely dilated. Right Atrium: Right atrial size was severely dilated. Pericardium: There is no evidence of pericardial effusion. Mitral Valve: The mitral valve is grossly normal. Severe mitral valve regurgitation. Tricuspid Valve: The tricuspid valve is grossly normal. Tricuspid valve regurgitation is severe. Aortic Valve: The aortic valve is calcified. There is severe calcifcation of the aortic valve. Aortic valve regurgitation is not visualized. Aortic valve mean gradient measures 7.0 mmHg. Aortic valve peak gradient measures 12.6 mmHg. Aortic valve area, by VTI measures 1.47 cm. Pulmonic Valve: The pulmonic valve was normal in structure. Pulmonic valve regurgitation is moderate. Aorta: The aortic root is normal in size and structure. Venous: The inferior vena  cava is dilated in size with less than 50% respiratory variability, suggesting right atrial pressure of 15 mmHg. IAS/Shunts: The interatrial septum was not well visualized. Additional Comments: A device lead is visualized in the right ventricle and right atrium. There is a large pleural effusion in the left lateral region.  LEFT VENTRICLE PLAX 2D LVIDd:         6.20 cm      Diastology LVIDs:         5.50 cm      LV e' lateral:   8.21 cm/s LV PW:         1.30 cm      LV E/e' lateral: 11.6 LV IVS:  1.50 cm LVOT diam:     2.20 cm LV SV:         40 LV SV Index:   22 LVOT Area:     3.80 cm  LV Volumes (MOD) LV vol d, MOD A2C: 222.0 ml LV vol d, MOD A4C: 203.0 ml LV vol s, MOD A2C: 147.0 ml LV vol s, MOD A4C: 133.5 ml LV SV MOD A2C:     75.0 ml LV SV MOD A4C:     203.0 ml LV SV MOD BP:      69.2 ml RIGHT VENTRICLE RV S prime:     7.27 cm/s TAPSE (M-mode): 0.6 cm LEFT ATRIUM              Index        RIGHT ATRIUM           Index LA diam:        5.50 cm  3.06 cm/m   RA Area:     27.90 cm LA Vol (A2C):   121.0 ml 67.32 ml/m  RA Volume:   101.00 ml 56.19 ml/m LA Vol (A4C):   122.0 ml 67.87 ml/m LA Biplane Vol: 123.0 ml 68.43 ml/m  AORTIC VALVE AV Area (Vmax):    1.91 cm AV Area (Vmean):   1.95 cm AV Area (VTI):     1.47 cm AV Vmax:           177.50 cm/s AV Vmean:          118.500 cm/s AV VTI:            0.273 m AV Peak Grad:      12.6 mmHg AV Mean Grad:      7.0 mmHg LVOT Vmax:         89.10 cm/s LVOT Vmean:        60.800 cm/s LVOT VTI:          0.106 m LVOT/AV VTI ratio: 0.39 MITRAL VALVE               TRICUSPID VALVE MV Area (PHT): 4.60 cm    TR Peak grad:   0.2 mmHg MV Decel Time: 165 msec    TR Vmax:        21.50 cm/s MV E velocity: 94.85 cm/s                            SHUNTS                            Systemic VTI:  0.11 m                            Systemic Diam: 2.20 cm Aditya Sabharwal Electronically signed by Ria Commander Signature Date/Time: 10/17/2023/6:37:19 PM    Final      Medications:      Current Medications:  amitriptyline   10 mg Oral QHS   furosemide   80 mg Intravenous BID   lactose free nutrition  237 mL Oral TID WC   metoprolol  succinate  12.5 mg Oral QHS   sodium chloride  flush  3 mL Intravenous Q12H   venlafaxine  XR  37.5 mg Oral Q breakfast    Infusions:     Assessment/Plan   1. Acute on Chronic Systolic Heart Failure, w/ Worsening Biventricular Failure - h/o ischemic CM. Patient had CABG x 5 in 8/18.  Post-op echo showed EF 35%. Since that time, LV EF has ranged from 25-35%. Since that time, EF had mostly ranged from 25-30% but did improve to 45-50% on echo 2/25.  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 Will ask EP to check device. ? Candidate for CRT-P upgrade, QRS wide 186 ms  - He is volume overloaded on exam. Start diuresis, IV Lasix  80 mg bid  - GDMT limited by soft BP and recent AKI, though improving  2. Transaminitis - suspect likely 2/2 RV failure/ hepatic congestion  - Lactic acid 1.2 and AKI improving so doubt low output  - AST 1215>>530, ALT 1543>>1052, Tbili 3.9.  - follow w/ diuresis   3. AKI on CKD IIIb - b/l sCr ~1.6 - SCr 2.7 on admission  - suspect cardiorenal  - improved, Scr down to 1.52 today  - follow trend while diuresing - avoid hypotension  - will restart home farxiga  if renal fx remains stable   4. Rectal Bleeding - 2/2 hemorrhoids + supratherapeutic INR  - GI has seen - bleeding has resolved with Anuso   5. Supra therapeutic INR - 2/2 hepatic dysfunction  - reversed w/ Vit K, 9.0>>3.9  - Will try to work to get him on a DOAC  6. Permanent AFib - rate controlled  - work to get on DOAC   7. CAD - h/o CABG - denies CP   8. 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 Optimization w/ upgrade to CRT-P   Length of Stay: [address] Marcine RIGGERS  10/18/2023, 10:57  AM    Advanced Heart Failure Team Pager [phone] (M-F; 7a - 5p)  Please contact [facility] for night-coverage after hours (4p -7a ) and weekends on [email or web address]

## 2023-10-18 NOTE — Progress Notes (Signed)
 Progress Note   Subjective  Patient states he is feeling better today. Denies pain or bleeding   Objective   Vital signs in last 24 hours: Temp:  [97.6 F (36.4 C)-97.8 F (36.6 C)] 97.7 F (36.5 C) (08/29 0913) Pulse Rate:  [90-93] 92 (08/29 0913) Resp:  [17-18] 17 (08/29 0913) BP: (95-118)/(62-91) 118/69 (08/29 1239) SpO2:  [96 %-97 %] 97 % (08/29 0913) Last BM Date :  (PTA) General:    white male in NAD Neurologic:  Alert and oriented,  grossly normal neurologically. Psych:  Cooperative. Normal mood and affect.  Intake/Output from previous day: 08/28 0701 - 08/29 0700 In: 1497.9 [P.O.:840; I.V.:657.9] Out: -  Intake/Output this shift: Total I/O In: 240 [P.O.:240] Out: -   Lab Results: Recent Labs    10/16/23 0301 10/17/23 0522 10/18/23 0316  WBC 10.9* 8.0 6.9  HGB 12.3* 12.1* 11.9*  HCT 37.1* 35.8* 35.9*  PLT 76* 65* 76*   BMET Recent Labs    10/16/23 0301 10/17/23 0522 10/18/23 0316  NA 130* 132* 135  K 4.9 4.4 4.4  CL 96* 101 102  CO2 18* 23 24  GLUCOSE 106* 95 118*  BUN 56* 47* 35*  CREATININE 2.68* 1.90* 1.52*  CALCIUM  8.4* 8.1* 8.1*   LFT Recent Labs    10/18/23 0316  PROT 5.1*  ALBUMIN  2.8*  AST 530*  ALT 1,052*  ALKPHOS 168*  BILITOT 3.7*   PT/INR Recent Labs    10/17/23 0522 10/18/23 0316  LABPROT 51.4* 39.7*  INR 5.4* 3.9*    Studies/Results: US  LIVER DOPPLER Result Date: 10/17/2023 CLINICAL DATA:  Transaminitis EXAM: DUPLEX ULTRASOUND OF LIVER TECHNIQUE: Color and duplex Doppler ultrasound was performed to evaluate the hepatic in-flow and out-flow vessels. COMPARISON:  CT abdomen and pelvis 04/25/2023 FINDINGS: Liver: Diffuse increased parenchymal echotexture suggesting diffuse fatty infiltration. Normal hepatic contour without nodularity. No focal mass lesion. Common bile duct measures 10 mm diameter. This is likely physiologic in the setting of prior cholecystectomy. Main Portal Vein size: 1.26 cm Portal Vein  Velocities Main Prox:  26.1 cm/sec Main Mid: 37.8 cm/sec Main Dist:  36.8 cm/sec Right: 36.7 cm/sec Left: 23.1 cm/sec Hepatopetal flow direction is demonstrated throughout the portal veins. Hepatic Vein Velocities Right:  37.8 cm/sec Middle:  42 cm/sec Left:  49.9 cm/sec Hepatofugal flow direction is demonstrated throughout the hepatic veins. IVC: Present and patent with normal respiratory phasicity. Hepatic Artery Velocity:  85.6 cm/sec Splenic Vein Velocity:  13.7 cm/sec Spleen: 12 cm x 6.2 cm x 4.8 cm with a total volume of 188 cm^3 (411 cm^3 is upper limit normal) Portal Vein Occlusion/Thrombus: No Splenic Vein Occlusion/Thrombus: No Ascites: Moderate ascites is present. Varices: None IMPRESSION: Normal spectral and color flow Doppler examination of the liver. Diffuse fatty infiltration of the liver. Electronically Signed   By: Elsie Gravely M.D.   On: 10/17/2023 22:26   US  Abdomen Complete Result Date: 10/17/2023 CLINICAL DATA:  Hepatic failure transaminitis EXAM: ABDOMEN ULTRASOUND COMPLETE COMPARISON:  CT 04/25/2023 FINDINGS: Gallbladder: Nonvisualized, possibly surgically absent, correlate with surgical history. Common bile duct: Diameter: 11.6 mm Liver: Appears slightly echogenic. Slightly lobulated morphology. No focal hepatic abnormality. Portal vein is patent on color Doppler imaging with normal direction of blood flow towards the liver. IVC: No abnormality visualized. Pancreas: Visualized portion unremarkable. Spleen: Upper normal in size at 13 cm Right Kidney: Length: 10.2 cm. Echogenicity within normal limits. No mass or hydronephrosis visualized. Left Kidney: Length: 9.9 cm. Echogenicity within  normal limits. No mass or hydronephrosis visualized. Abdominal aorta: No aneurysm visualized. Other findings: Right-sided pleural effusion. Small volume ascites within the abdomen and pelvis. IMPRESSION: 1. Slightly echogenic liver with slightly lobulated morphology, possible cirrhosis. No focal hepatic  abnormality. 2. Nonvisualized gallbladder, possibly surgically absent, correlate with surgical history. Common bile duct dilated up to 11.6 mm could be due to surgical change assuming prior cholecystectomy. If biliary obstruction is a concern, correlation with MRCP could be obtained 3. Small volume ascites. Right-sided pleural effusion. Electronically Signed   By: Luke Bun M.D.   On: 10/17/2023 21:12   ECHOCARDIOGRAM COMPLETE Result Date: 10/17/2023    ECHOCARDIOGRAM REPORT   Patient Name:   Jason Day   Date of Exam: 10/17/2023 Medical Rec #:  [id number]  Height:       73.0 in Accession #:    [id number] Weight:       131.0 lb Date of Birth:  [birth year]  BSA:          1.798 m Patient Age:    83 years   BP:           103/63 mmHg Patient Gender: M          HR:           90 bpm. Exam Location:  Inpatient Procedure: 2D Echo, Color Doppler and Cardiac Doppler (Both Spectral and Color            Flow Doppler were utilized during procedure). Indications:    CHF  History:        Patient has prior history of Echocardiogram examinations, most                 recent 03/30/2023. CHF, CAD; Arrythmias:Atrial Fibrillation.  Sonographer:    VALENTE, ADAM Referring Phys: [id number] JEFFREY T MCCLUNG IMPRESSIONS  1. Left ventricular ejection fraction, by estimation, is 20 to 25%. The left ventricle has severely decreased function. The left ventricle demonstrates global hypokinesis. There is moderate concentric left ventricular hypertrophy. 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  2. Right ventricular systolic function is moderately reduced. The right ventricular size is normal.  3. Left atrial size was severely dilated.  4. Right atrial size was severely dilated.  5. Large pleural effusion in the left lateral region.  6. The mitral valve is grossly normal. Severe mitral valve regurgitation.  7. Tricuspid valve regurgitation is severe.  8. The aortic valve is calcified. There is severe calcifcation of the aortic  valve. Aortic valve regurgitation is not visualized. Aortic valve area, by VTI measures 1.47 cm. Aortic valve mean gradient measures 7.0 mmHg. Aortic valve Vmax measures 1.78 m/s.  9. Pulmonic valve regurgitation is moderate. 10. The inferior vena cava is dilated in size with <50% respiratory variability, suggesting right atrial pressure of 15 mmHg. FINDINGS  Left Ventricle: Left ventricular ejection fraction, by estimation, is 20 to 25%. The left ventricle has severely decreased function. The left ventricle demonstrates global hypokinesis. The left ventricular internal cavity size was mildly to moderately dilated. There is moderate concentric left ventricular hypertrophy of the basal-septal segment. Left ventricular diastolic function could not be evaluated due to atrial fibrillation. Left ventricular diastolic function could not be evaluated. Right Ventricle: The right ventricular size is normal. No increase in right ventricular wall thickness. Right ventricular systolic function is moderately reduced. Left Atrium: Left atrial size was severely dilated. Right Atrium: Right atrial size was severely dilated. Pericardium: There is no evidence of pericardial effusion. Mitral  Valve: The mitral valve is grossly normal. Severe mitral valve regurgitation. Tricuspid Valve: The tricuspid valve is grossly normal. Tricuspid valve regurgitation is severe. Aortic Valve: The aortic valve is calcified. There is severe calcifcation of the aortic valve. Aortic valve regurgitation is not visualized. Aortic valve mean gradient measures 7.0 mmHg. Aortic valve peak gradient measures 12.6 mmHg. Aortic valve area, by VTI measures 1.47 cm. Pulmonic Valve: The pulmonic valve was normal in structure. Pulmonic valve regurgitation is moderate. Aorta: The aortic root is normal in size and structure. Venous: The inferior vena cava is dilated in size with less than 50% respiratory variability, suggesting right atrial pressure of 15 mmHg.  IAS/Shunts: The interatrial septum was not well visualized. Additional Comments: A device lead is visualized in the right ventricle and right atrium. There is a large pleural effusion in the left lateral region.  LEFT VENTRICLE PLAX 2D LVIDd:         6.20 cm      Diastology LVIDs:         5.50 cm      LV e' lateral:   8.21 cm/s LV PW:         1.30 cm      LV E/e' lateral: 11.6 LV IVS:        1.50 cm LVOT diam:     2.20 cm LV SV:         40 LV SV Index:   22 LVOT Area:     3.80 cm  LV Volumes (MOD) LV vol d, MOD A2C: 222.0 ml LV vol d, MOD A4C: 203.0 ml LV vol s, MOD A2C: 147.0 ml LV vol s, MOD A4C: 133.5 ml LV SV MOD A2C:     75.0 ml LV SV MOD A4C:     203.0 ml LV SV MOD BP:      69.2 ml RIGHT VENTRICLE RV S prime:     7.27 cm/s TAPSE (M-mode): 0.6 cm LEFT ATRIUM              Index        RIGHT ATRIUM           Index LA diam:        5.50 cm  3.06 cm/m   RA Area:     27.90 cm LA Vol (A2C):   121.0 ml 67.32 ml/m  RA Volume:   101.00 ml 56.19 ml/m LA Vol (A4C):   122.0 ml 67.87 ml/m LA Biplane Vol: 123.0 ml 68.43 ml/m  AORTIC VALVE AV Area (Vmax):    1.91 cm AV Area (Vmean):   1.95 cm AV Area (VTI):     1.47 cm AV Vmax:           177.50 cm/s AV Vmean:          118.500 cm/s AV VTI:            0.273 m AV Peak Grad:      12.6 mmHg AV Mean Grad:      7.0 mmHg LVOT Vmax:         89.10 cm/s LVOT Vmean:        60.800 cm/s LVOT VTI:          0.106 m LVOT/AV VTI ratio: 0.39 MITRAL VALVE               TRICUSPID VALVE MV Area (PHT): 4.60 cm    TR Peak grad:   0.2 mmHg MV Decel Time: 165 msec    TR  Vmax:        21.50 cm/s MV E velocity: 94.85 cm/s                            SHUNTS                            Systemic VTI:  0.11 m                            Systemic Diam: 2.20 cm Aditya Sabharwal Electronically signed by Ria Commander Signature Date/Time: 10/17/2023/6:37:19 PM    Final        Assessment / Plan:    83 y/o male here with the following:  Transaminitis AKI Acute on chronic heart failure Rectal  bleeding secondary to hemorrhoids IBS   Had some nonspecific symptoms and rectal bleeding which led to admission.  INR supratherapeutic on admission.  Rectal bleeding very likely due to hemorrhoids, has resolved with Anusol .   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 Workup done - echo showing reduced EF, suspected congestive hepatopathy from heart failure is causing his transaminitis. Infectious workup negative, no new meds in regards to DILI risk. US  shows patent doppler. Fatty liver noted, CBD at ULN but likely due to post cholecystectomy state. Hopefully with treatment of his heart failure his renal / liver testing will improve.   Otherwise continue anusol  for his hemorrhoids, no further bleeding. Elavil  has been dose reduced  / tapering, may be good to stop it longer term due to his age comorbidities and manage IBS with other options.   Overall improved. Continue supportive measures for CHF and trend liver enzymes. I suspect these will continue to improve, but if not please let us  know. If enzymes / bilirubin remain elevated consider MRCP to clear his CBD, but suspect this will not be necessary if positive trend continues.  We will sign off for now, patient can follow up with Dr. Stacia in our office post hospitalization. Call with questions.   Marcey Naval, MD [facility]

## 2023-10-18 NOTE — Plan of Care (Signed)
  Problem: Education: Goal: Knowledge of General Education information will improve Description: Including pain rating scale, medication(s)/side effects and non-pharmacologic comfort measures Outcome: Progressing   Problem: Activity: Goal: Risk for activity intolerance will decrease Outcome: Progressing   Problem: Nutrition: Goal: Adequate nutrition will be maintained Outcome: Progressing   Problem: Coping: Goal: Level of anxiety will decrease Outcome: Progressing   Problem: Elimination: Goal: Will not experience complications related to bowel motility Outcome: Progressing   Problem: Skin Integrity: Goal: Risk for impaired skin integrity will decrease Outcome: Progressing   

## 2023-10-18 NOTE — Telephone Encounter (Signed)
 Pharmacy Patient Advocate Encounter  Insurance verification completed.    The patient is insured through [employer]. Patient has Medicare and is not eligible for a copay card, but may be able to apply for patient assistance or Medicare RX Payment Plan (Patient Must reach out to their plan, if eligible for payment plan), if available.    Ran test claim for Eliquis and the current 30 day co-pay is $0.00.  Ran test claim for Xarelto  and the current 30 day co-pay is $0.00.  This test claim was processed through [facility]- copay amounts may vary at other pharmacies due to pharmacy/plan contracts, or as the patient moves through the different stages of their insurance plan.

## 2023-10-18 NOTE — Progress Notes (Signed)
 Mobility Specialist Progress Note:    10/18/23 1258  Mobility  Activity Ambulated with assistance (In hallway)  Level of Assistance Contact guard assist, steadying assist  Assistive Device Other (Comment) (HHA/ Handrails)  Distance Ambulated (ft) 350 ft  Activity Response Tolerated well  Mobility Referral Yes  Mobility visit 1 Mobility  Mobility Specialist Start Time (ACUTE ONLY) 1121  Mobility Specialist Stop Time (ACUTE ONLY) 1134  Mobility Specialist Time Calculation (min) (ACUTE ONLY) 13 min   Received pt in bed and agreeable to mobility. No physical assistance needed. No c/o. Returned to room without fault. Left pt in chair with alarm on. Personal belongings and call light within reach. All needs met.  Lavanda Pollack Mobility Specialist  Please contact via [employer] or  Rehab Office [phone]

## 2023-10-18 NOTE — Plan of Care (Signed)
   Problem: Education: Goal: Knowledge of General Education information will improve Description Including pain rating scale, medication(s)/side effects and non-pharmacologic comfort measures Outcome: Progressing   Problem: Health Behavior/Discharge Planning: Goal: Ability to manage health-related needs will improve Outcome: Progressing

## 2023-10-19 DIAGNOSIS — K649 Unspecified hemorrhoids: Secondary | ICD-10-CM

## 2023-10-19 DIAGNOSIS — R7401 Elevation of levels of liver transaminase levels: Secondary | ICD-10-CM

## 2023-10-19 DIAGNOSIS — N179 Acute kidney failure, unspecified: Secondary | ICD-10-CM | POA: Diagnosis not present

## 2023-10-19 DIAGNOSIS — K625 Hemorrhage of anus and rectum: Secondary | ICD-10-CM | POA: Diagnosis not present

## 2023-10-19 DIAGNOSIS — I5022 Chronic systolic (congestive) heart failure: Secondary | ICD-10-CM | POA: Diagnosis not present

## 2023-10-19 LAB — CBC
HCT: 38.6 % — ABNORMAL LOW (ref 39.0–52.0)
Hemoglobin: 12.5 g/dL — ABNORMAL LOW (ref 13.0–17.0)
MCH: 31.8 pg (ref 26.0–34.0)
MCHC: 32.4 g/dL (ref 30.0–36.0)
MCV: 98.2 fL (ref 80.0–100.0)
Platelets: 89 K/uL — ABNORMAL LOW (ref 150–400)
RBC: 3.93 MIL/uL — ABNORMAL LOW (ref 4.22–5.81)
RDW: 14.6 % (ref 11.5–15.5)
WBC: 6.1 K/uL (ref 4.0–10.5)
nRBC: 0 % (ref 0.0–0.2)

## 2023-10-19 LAB — COMPREHENSIVE METABOLIC PANEL WITH GFR
ALT: 654 U/L — ABNORMAL HIGH (ref 0–44)
AST: 221 U/L — ABNORMAL HIGH (ref 15–41)
Albumin: 2.8 g/dL — ABNORMAL LOW (ref 3.5–5.0)
Alkaline Phosphatase: 149 U/L — ABNORMAL HIGH (ref 38–126)
Anion gap: 11 (ref 5–15)
BUN: 30 mg/dL — ABNORMAL HIGH (ref 8–23)
CO2: 29 mmol/L (ref 22–32)
Calcium: 8.2 mg/dL — ABNORMAL LOW (ref 8.9–10.3)
Chloride: 94 mmol/L — ABNORMAL LOW (ref 98–111)
Creatinine, Ser: 1.28 mg/dL — ABNORMAL HIGH (ref 0.61–1.24)
GFR, Estimated: 56 mL/min — ABNORMAL LOW (ref >60)
Glucose, Bld: 126 mg/dL — ABNORMAL HIGH (ref 70–99)
Potassium: 3.9 mmol/L (ref 3.5–5.1)
Sodium: 134 mmol/L — ABNORMAL LOW (ref 135–145)
Total Bilirubin: 4.3 mg/dL — ABNORMAL HIGH (ref 0.0–1.2)
Total Protein: 5.3 g/dL — ABNORMAL LOW (ref 6.5–8.1)

## 2023-10-19 LAB — IGG: IgG (Immunoglobin G), Serum: 994 mg/dL (ref 603–1613)

## 2023-10-19 LAB — MAGNESIUM: Magnesium: 1.9 mg/dL (ref 1.7–2.4)

## 2023-10-19 LAB — PROTIME-INR
INR: 2.7 — ABNORMAL HIGH (ref 0.8–1.2)
Prothrombin Time: 29.7 s — ABNORMAL HIGH (ref 11.4–15.2)

## 2023-10-19 NOTE — Progress Notes (Signed)
 Patient has been A&O x 4 throughout this shift. Answers all questions appropriately. No agitation or restlessness observed except when needing to go to the restroom (stating I don't want to wet on myself) Patient was given lasix  per order and so has had to use the restroom regularly all night. Assured patient that he was not a bother as he kept apologizing for having to get up. Observed that he is a bit unsteady when first getting out of bed so he was instructed to please call for assistance and wait on someone to help him before attempting to get up. He has followed that request as long as someone gets there in a reasonable amount of time. Tele sitter monitor is still in place. Patient is resting at this time.

## 2023-10-19 NOTE — Plan of Care (Signed)
  Problem: Clinical Measurements: Goal: Diagnostic test results will improve Outcome: Progressing   Problem: Activity: Goal: Risk for activity intolerance will decrease Outcome: Progressing   Problem: Nutrition: Goal: Adequate nutrition will be maintained Outcome: Progressing   Problem: Pain Managment: Goal: General experience of comfort will improve and/or be controlled Outcome: Progressing   Problem: Safety: Goal: Ability to remain free from injury will improve Outcome: Not Progressing

## 2023-10-19 NOTE — Progress Notes (Signed)
 Jason Day  FMW:[id number] DOB: 12/02/1940 DOA: 10/14/2023 PCP: [name], PA-C    Brief Narrative:  83 year old with a history of memory loss, CAD, chronic systolic CHF, atrial fibrillation on warfarin, CKD stage IIIa, heart block status post pacemaker, sigmoid diverticulosis, hemorrhoids, and IBS who presented to the ER 8/25 with rectal bleeding described as bright red blood per rectum since 8/23 associated with loss of appetite increased fatigue and exertional dyspnea.  Goals of Care:   Code Status: Full Code   DVT prophylaxis: SCDs Start: 10/15/23 0056   Interim Hx: Mental status appears to have stabilized for now.  Afebrile.  Vital signs stable.  Patient did refuse to allow blood draw this morning.  After I see him that he has now decided he is willing to allow this afternoon.  He has no acute complaints.  He tells me he is ready to go home.  He reports a good appetite and is eating much at the time of my visit without difficulty.  Assessment & Plan:  Transaminitis LFTs found to be markedly elevated 8/28 with AST and ALT of 1215 and 1543 respectively - delayed Tylenol  level unremarkable - viral hepatitis studies negative - LFTs normal June 2025 - liver Doppler is unremarkable - liver ultrasound reveals lobulated morphology suggestive of possible cirrhosis but no focal hepatic abnormality otherwise - the current suspicion is that this is liver failure related to right heart failure (congestive hepatopathy) as well as shock liver in the setting of severe systolic CHF and bouts of hypotension at home - no further GI workup planned for now with intention of managing heart failure and monitoring liver function for improvement  Recent Labs  Lab 10/17/23 0522 10/18/23 0316  AST 1,215* 530*  ALT 1,543* 1,052*  ALKPHOS 161* 168*  BILITOT 3.9* 3.7*  PROT 5.2* 5.1*  ALBUMIN  3.0* 2.8*    Supratherapeutic INR INR 4.0 at presentation and increased to peak of 9.0 during admission  -warfarin on hold -dosed with vitamin K x 1 - INR slowly improving -appears to be due to acute hepatitis as noted above - now consistently trending downward  Acute worsening of Chronic systolic CHF - 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% via TTE 03/30/2023 - TTE this admit notes signif worsening of EF to 20-25% w/ global hypokinesis w/ severe mitral and tricuspid regurgitation - CHF Team directing care of this issue  Memory impairment/dementia - acute delirium Multifactorial related to underlying cognitive impairment complicated by hospitalization and acute illness - CT head without acute findings - ammonia level unremarkable -mental status has markedly improved over the last 24 hours  Severe somnolence/lethargy Possibly related to Elavil  being used for treatment of IBS - Elavil  is being weaned to off - ammonia level normal -markedly improved  Chronic persistent weight loss Followed by GI in outpatient setting with decrease in weight from 240 pounds to 140 pounds unintentionally over a 6-year timeframe - CT chest suggested during this admission - 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   IBS Long history of complicated, difficult to manage IBS - longstanding loose stools - see GI note for history - to consider a trial of citrucel if GI symptoms return  AKI on CKD stage IIIa Creatinine 2.69 on admission - baseline creatinine approximately 1.2 - held Aldactone  valsartan  and Farxiga  initially due to hypotension - renal US  without acutely reversible issues and consistent with medical renal disease - renal function steadily improved w/ volume resuscitation and avoidance of possible nephrotoxic drugs, but pt has rapidly transitioned  to volume overloaded state - follow renal fxn w/ resumption of diuretic  Recent Labs  Lab 10/15/23 0423 10/15/23 0949 10/16/23 0301 10/17/23 0522 10/18/23 0316  CREATININE 2.69* 2.65* 2.68* 1.90* 1.52*    BRBPR - suspected hemorrhoidal bleeding Has remained  hemodynamically stable - hemoglobin slightly decreased from 13.4 presentation to 11.4 - colonoscopy 08/04/2019 noted internal hemorrhoids - cont with Anusol  suppositories - warfarin on hold - this issues has essentially resolved at this time   Hyperkalemia Received acute temporizing measures as well as Lokelma  initially - stable presently  Lactic acidosis Appears to have been simply due to hypovolemia - resolved with volume expansion  Atrial fibrillation on chronic warfarin  Rate controlled - continue metoprolol  - holding warfarin for now given acute liver disease - given overall picture I feel he is no longer an appropriate candidate for anticoag due to a high risk of bleeding complications/injury   Complete heart block status post PPM  CAD status post CABG x5 2018 Asymptomatic  Prolonged QTc QTc 521 ms during this admission - care with medication selection   Chronic thrombocytopenia Platelet count trending downward -no evidence of active bleeding presently -monitor trend  Underweight - Body mass index is 17.28 kg/m.   Family Communication: No family present at time of exam Disposition: Anticipate discharge home, possibly as soon as 8/31   Objective: Blood pressure 109/75, pulse 82, temperature 97.6 F (36.4 C), temperature source Oral, resp. rate 15, height 6' 1 (1.854 m), weight 59.4 kg, SpO2 (!) 89%.  Intake/Output Summary (Last 24 hours) at 10/19/2023 1518 Last data filed at 10/19/2023 1340 Gross per 24 hour  Intake 720 ml  Output --  Net 720 ml   Filed Weights   10/14/23 1610  Weight: 59.4 kg    Examination: General: No acute respiratory distress Lungs: Clear to auscultation bilaterally without wheezes or crackles Cardiovascular: Regular rate and rhythm without murmur Abdomen: Nontender, nondistended, soft, bowel sounds positive, no rebound, no ascites, no appreciable mass Extremities: No significant edema bilateral lower extremities   CBC: Recent Labs  Lab  10/14/23 1623 10/15/23 0423 10/16/23 0301 10/17/23 0522 10/18/23 0316  WBC 8.6   < > 10.9* 8.0 6.9  NEUTROABS 6.9  --   --   --   --   HGB 13.4   < > 12.3* 12.1* 11.9*  HCT 41.4   < > 37.1* 35.8* 35.9*  MCV 99.0   < > 97.4 96.0 97.0  PLT 128*   < > 76* 65* 76*   < > = values in this interval not displayed.   Basic Metabolic Panel: Recent Labs  Lab 10/15/23 0949 10/16/23 0301 10/17/23 0522 10/18/23 0316  NA 128* 130* 132* 135  K 4.9 4.9 4.4 4.4  CL 93* 96* 101 102  CO2 18* 18* 23 24  GLUCOSE 146* 106* 95 118*  BUN 53* 56* 47* 35*  CREATININE 2.65* 2.68* 1.90* 1.52*  CALCIUM  8.5* 8.4* 8.1* 8.1*  MG 2.5*  --   --   --    GFR: Estimated Creatinine Clearance: 31.5 mL/min (A) (by C-G formula based on SCr of 1.52 mg/dL (H)).   Scheduled Meds:  amitriptyline   10 mg Oral QHS   lactose free nutrition  237 mL Oral TID WC   metoprolol  succinate  12.5 mg Oral QHS   sodium chloride  flush  3 mL Intravenous Q12H   venlafaxine  XR  37.5 mg Oral Q breakfast      LOS: 5 days   Reyes  IVAR Moores, MD [facility] Office  [phone] Pager - Text Page per Amion  If 7PM-7AM, please contact night-coverage per Amion 10/19/2023, 3:18 PM

## 2023-10-20 DIAGNOSIS — R7401 Elevation of levels of liver transaminase levels: Secondary | ICD-10-CM | POA: Diagnosis not present

## 2023-10-20 DIAGNOSIS — I4891 Unspecified atrial fibrillation: Secondary | ICD-10-CM | POA: Diagnosis not present

## 2023-10-20 DIAGNOSIS — N179 Acute kidney failure, unspecified: Secondary | ICD-10-CM | POA: Diagnosis not present

## 2023-10-20 DIAGNOSIS — K625 Hemorrhage of anus and rectum: Secondary | ICD-10-CM | POA: Diagnosis not present

## 2023-10-20 DIAGNOSIS — I5022 Chronic systolic (congestive) heart failure: Secondary | ICD-10-CM | POA: Diagnosis not present

## 2023-10-20 MED ORDER — FUROSEMIDE 40 MG PO TABS
40.0000 mg | ORAL_TABLET | Freq: Every day | ORAL | Status: DC
  Administered 2023-10-20: 40 mg via ORAL
  Filled 2023-10-20 (×2): qty 1

## 2023-10-20 NOTE — Evaluation (Signed)
 Physical Therapy Evaluation Patient Details Name: Jason Day MRN: [id number] DOB: 13-Mar-1940 Today's Date: 10/20/2023  History of Present Illness  Pt is an 83 y.o. male presenting 8/25 with BRBPR, loss of appetite, fatigue, DOE. Workup for suspected hemorrhoidal bleed, hyperkalemia, lactic acidosis. PMH: memory loss, CAD, CHF, afib on warfarin, CKD IIIa, heart block s/p pacemaker, sigmoid diverticulosis, hemorrhoids, IBS.   Clinical Impression  Pt in bed upon arrival of PT, agreeable to evaluation at this time. Prior to admission the pt was independent without need for DME, living with his wife, active out in the yard, and driving in community. The pt presents with good strength, but significant deficits in dynamic stability and reactive balance making him at high risk of falls after return home. He was able to complete bed mobility and transfers with CGA and no DME, but needed up to modA to correct LOB with balance challenge while ambulating. Pt refused all offers of DME for gait or in bathroom to improve safety after return home. Will benefit from continued skilled PT to progress functional stability and reactions to reduce risk of continued falls after d/c home.   Dynamic Gait Index (DGI): 9/24 (<19 indicates increased risk for falls)    If plan is discharge home, recommend the following: A little help with walking and/or transfers;A little help with bathing/dressing/bathroom;Assistance with cooking/housework;Direct supervision/assist for medications management;Direct supervision/assist for financial management;Assist for transportation;Supervision due to cognitive status;Help with stairs or ramp for entrance   Can travel by private vehicle        Equipment Recommendations Rolling walker (2 wheels);BSC/3in1  Recommendations for Other Services  OT consult    Functional Status Assessment Patient has had a recent decline in their functional status and demonstrates the ability to make  significant improvements in function in a reasonable and predictable amount of time.     Precautions / Restrictions Precautions Precautions: Fall Recall of Precautions/Restrictions: Impaired Restrictions Weight Bearing Restrictions Per Provider Order: No      Mobility  Bed Mobility Overal bed mobility: Needs Assistance Bed Mobility: Supine to Sit     Supine to sit: Supervision          Transfers Overall transfer level: Needs assistance Equipment used: None Transfers: Sit to/from Stand, Bed to chair/wheelchair/BSC Sit to Stand: Contact guard assist   Step pivot transfers: Contact guard assist       General transfer comment: CGA to rise and steady, slight increased sway in standing but no overt LOB    Ambulation/Gait Ambulation/Gait assistance: Contact guard assist, Mod assist Gait Distance (Feet): 250 Feet Assistive device: None Gait Pattern/deviations: Step-through pattern, Decreased stride length, Staggering right, Drifts right/left Gait velocity: decreased but pt reports at his baseline Gait velocity interpretation: <1.31 ft/sec, indicative of household ambulator   General Gait Details: pt reaching for UE support in hallway (rail, walls, doors) stating it was just because it was there but demos increased sway without UE support. x2 LOB with head turns and quick turns needing modA to recover. pt refuses to use DME  Stairs Stairs: Yes Stairs assistance: Contact guard assist Stair Management: One rail Left, Alternating pattern, Forwards Number of Stairs: 12 General stair comments: slow but stable  Wheelchair Mobility     Tilt Bed    Modified Rankin (Stroke Patients Only)       Balance Overall balance assessment: Needs assistance Sitting-balance support: No upper extremity supported, Feet supported Sitting balance-Leahy Scale: Good     Standing balance support: No upper extremity supported, During  functional activity Standing balance-Leahy Scale:  Poor Standing balance comment: x2 LOB needing modA to recover with balance challenge                 Standardized Balance Assessment Standardized Balance Assessment : Dynamic Gait Index   Dynamic Gait Index Level Surface: Mild Impairment Change in Gait Speed: Moderate Impairment Gait with Horizontal Head Turns: Severe Impairment Gait with Vertical Head Turns: Moderate Impairment Gait and Pivot Turn: Severe Impairment Step Over Obstacle: Moderate Impairment Step Around Obstacles: Mild Impairment Steps: Mild Impairment Total Score: 9       Pertinent Vitals/Pain Pain Assessment Pain Assessment: No/denies pain    Home Living Family/patient expects to be discharged to:: Private residence Living Arrangements: Spouse/significant other Available Help at Discharge: Family;Available 24 hours/day Type of Home: House Home Access: Stairs to enter   [employer] of Steps: 3 stairs at front, flat in garage Alternate Level Stairs-Number of Steps: flight Home Layout: Two level;Bed/bath upstairs Home Equipment: None      Prior Function Prior Level of Function : Independent/Modified Independent;Driving             Mobility Comments: no DME, active in yard, no falls, later pt admits to furniture walking in home and slight LOB when moving quickly ADLs Comments: independent, wife does IADLs, pt drives and manages medications     Extremity/Trunk Assessment   Upper Extremity Assessment Upper Extremity Assessment: Defer to OT evaluation;LUE deficits/detail;Right hand dominant (limited L shoulder ROM, reports hx of surgery)    Lower Extremity Assessment Lower Extremity Assessment: Generalized weakness (grossly functional to MMT, denies change in sensation)    Cervical / Trunk Assessment Cervical / Trunk Assessment: Kyphotic  Communication   Communication Communication: Impaired Factors Affecting Communication: Hearing impaired    Cognition Arousal:  Alert Behavior During Therapy: WFL for tasks assessed/performed   PT - Cognitive impairments: Safety/Judgement                       PT - Cognition Comments: pt with poor insight to deficits, fall risk, and need for assist. unwilling to make changes in home (grab bars) to improve safety or to use DME Following commands: Impaired Following commands impaired: Follows one step commands inconsistently (due to [facility])     Cueing Cueing Techniques: Verbal cues     General Comments General comments (skin integrity, edema, etc.): pt high fall risk, refuses to use DME        Assessment/Plan    PT Assessment Patient needs continued PT services  PT Problem List Decreased activity tolerance;Decreased balance;Decreased mobility;Decreased safety awareness       PT Treatment Interventions DME instruction;Gait training;Stair training;Functional mobility training;Therapeutic activities;Therapeutic exercise;Balance training;Cognitive remediation;Patient/family education    PT Goals (Current goals can be found in the Care Plan section)  Acute Rehab PT Goals Patient Stated Goal: to return home and remain independent PT Goal Formulation: With patient Time For Goal Achievement: 11/03/23 Potential to Achieve Goals: Fair    Frequency Min 2X/week        AM-PAC PT 6 Clicks Mobility  Outcome Measure Help needed turning from your back to your side while in a flat bed without using bedrails?: None Help needed moving from lying on your back to sitting on the side of a flat bed without using bedrails?: None Help needed moving to and from a bed to a chair (including a wheelchair)?: A Little Help needed standing up from a chair using your arms (e.g., wheelchair  or bedside chair)?: A Little Help needed to walk in hospital room?: A Little Help needed climbing 3-5 steps with a railing? : A Little 6 Click Score: 20    End of Session Equipment Utilized During Treatment: Gait belt Activity  Tolerance: Patient tolerated treatment well Patient left: in chair;with call bell/phone within reach;with chair alarm set (telesitter) Nurse Communication: Mobility status PT Visit Diagnosis: Unsteadiness on feet (R26.81);Muscle weakness (generalized) (M62.81)    Time: 8841-8778 PT Time Calculation (min) (ACUTE ONLY): 23 min   Charges:   PT Evaluation $PT Eval Moderate Complexity: 1 Mod PT Treatments $Therapeutic Activity: 8-22 mins PT General Charges $$ ACUTE PT VISIT: 1 Visit         Izetta Call, PT, DPT   Acute Rehabilitation Department Office [phone] Secure Chat Communication Preferred  Izetta JULIANNA Call 10/20/2023, 2:27 PM

## 2023-10-20 NOTE — Consult Note (Signed)
   Electrophysiology consultation   Patient ID: Jason Day MRN: [id number]; DOB: September 25, 1940  Admit date: 10/14/2023 Date of Consult: 10/20/2023  PCP:  Jason Alm RAMAN, PA-C   History of Present Illness:   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  The patient has a history of systolic heart failure, severe coronary artery disease with prior CABG, CKD 3B, permanent atrial fibrillation and severe valvular heart disease.  He was admitted to the hospital with GI bleeding and supratherapeutic INR.  He also had a severe transaminitis.  EF demonstrated I dropped to 20 to 25% with global hypokinesis.  He is without shortness of breath.    He is up to chair this AM. Feels well.   Past medical, surgical, social and family history reviewed.  ROS:  Please see the history of present illness.  All other ROS reviewed and negative.     Physical Exam/Data:   Vitals:   10/19/23 2029 10/20/23 0003 10/20/23 0543 10/20/23 0817  BP: 106/60 106/60 93/63 114/83  Pulse: 94  88 (!) 101  Resp: 18  18 16   Temp: 97.7 F (36.5 C)  97.6 F (36.4 C) 97.8 F (36.6 C)  TempSrc: Oral  Oral Oral  SpO2: 96%  94% 96%  Weight:      Height:        General:  Well nourished, well developed, in no acute distress.  Elderly.  Thin. Cardiac: 2 out of 6 systolic murmur.  Warm extremities Lungs:  clear to auscultation bilaterally, no wheezing, rhonchi or rales  Psych:  Normal affect   EKG:  The EKG was personally reviewed and demonstrates: Paced QRS 186 ms. Telemetry:  Telemetry was personally reviewed and demonstrates: Sinus.  Ventricular paced.    Assessment and Plan:   [name] is admitted with GI bleeding and a supratherapeutic INR complicated by severe transaminitis.  His newly reduced ejection fraction down to 20 to 25% in the setting of chronic RV  pacing.    #Chronic systolic heart failure #RV pacing #Valvular heart disease   he has a left bundle area lead with evidence of conduction system capture on twelve-lead EKG.  I suspect much of his wide QRS is due to his ischemic cardiomyopathy and aneurysmal LV as demonstrated on echocardiogram.  He is currently a poor candidate for invasive EP procedures and I would not recommend upgrading him to a biventricular device at this time.  This was discussed in detail with the patient and his family.  I would recommend treating his heart failure and valvular heart disease and trying to optimize his volume status.  Recommend follow-up in the outpatient setting to discuss his course and whether device intervention would be warranted in the future.  Restart oral lasix  today.  #AF INR supratherapeutic on admission. I would not recommend restarting this medication given the risk/benefit balance.   #Cachexia Unclear cause. Chronic diarrhea, dementia, heart failure could all be contributing. This does put him at prohibitive risk for device intervention.     Ole T. Cindie, MD, [facility], [facility] Cardiac Electrophysiology

## 2023-10-20 NOTE — Plan of Care (Signed)
  Problem: Clinical Measurements: Goal: Diagnostic test results will improve Outcome: Progressing   Problem: Activity: Goal: Risk for activity intolerance will decrease Outcome: Progressing   Problem: Nutrition: Goal: Adequate nutrition will be maintained Outcome: Progressing   Problem: Coping: Goal: Level of anxiety will decrease Outcome: Progressing   

## 2023-10-20 NOTE — Progress Notes (Signed)
 Jason Day  FMW:[id number] DOB: Nov 14, 1940 DOA: 10/14/2023 PCP: [name], PA-C    Brief Narrative:  83 year old with a history of memory loss, CAD, chronic systolic CHF, atrial fibrillation on warfarin, CKD stage IIIa, heart block status post pacemaker, sigmoid diverticulosis, hemorrhoids, and IBS who presented to the ER 8/25 with rectal bleeding described as bright red blood per rectum since 8/23 associated with loss of appetite increased fatigue and exertional dyspnea.  Goals of Care:   Code Status: Full Code   DVT prophylaxis: SCDs Start: 10/15/23 0056   Interim Hx: Continues to exhibit a pattern of being very disagreeable/borderline agitated in a.m. with refusal of care and blood draws, and then being very pleasant and cooperative in the afternoons.  No acute events recorded overnight.  Afebrile.  Vital signs stable.  Quite pleasant this afternoon.  Has been ambulating without difficulty.  Following the advice of his nurse today without difficulty.  No complaints at the time of my visit.  Assessment & Plan:  Transaminitis LFTs found to be markedly elevated 8/28 with AST and ALT of 1215 and 1543 respectively - delayed Tylenol  level unremarkable - viral hepatitis studies negative - LFTs normal June 2025 - liver Doppler unremarkable - liver ultrasound revealed lobulated morphology suggestive of possible cirrhosis but no focal hepatic abnormality otherwise - the current suspicion is that this is liver failure related to right heart failure (congestive hepatopathy) as well as shock liver in the setting of severe systolic CHF and bouts of hypotension at home - no further GI workup planned for now with intention of managing heart failure and monitoring liver function for improvement -LFTs improving with stabilization of blood pressure  Recent Labs  Lab 10/17/23 0522 10/18/23 0316 10/19/23 1645  AST 1,215* 530* 221*  ALT 1,543* 1,052* 654*  ALKPHOS 161* 168* 149*  BILITOT 3.9* 3.7*  4.3*  PROT 5.2* 5.1* 5.3*  ALBUMIN  3.0* 2.8* 2.8*    Supratherapeutic INR INR 4.0 at presentation and increased to peak of 9.0 during admission  -warfarin discontinued- dosed with vitamin K x 1 - INR slowly improving -appears to be due to acute hepatitis as noted above - INR now consistently trending downward   Acute worsening of Chronic systolic CHF - 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% via TTE 03/30/2023 - TTE this admit notes signif worsening of EF to 20-25% w/ global hypokinesis w/ severe mitral and tricuspid regurgitation - CHF Team directing care of this issue - oral diuretic has been resumed   Memory impairment/dementia - acute delirium Multifactorial related to underlying cognitive impairment complicated by hospitalization and acute illness - CT head without acute findings - ammonia level unremarkable -mental status has now stabilized with the caveat that the patient has a propensity for mild agitation and uncooperativeness each morning  Severe somnolence/lethargy Possibly related to Elavil  being used for treatment of IBS - Elavil  has been discontinued - ammonia level normal -symptoms markedly improved with patient no longer lethargic  Chronic persistent weight loss Followed by GI in outpatient setting with decrease in weight from 240 pounds to 140 pounds unintentionally over a 6-year timeframe - CT chest suggested during this admission - 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   IBS Long history of complicated, difficult to manage IBS - longstanding loose stools - see GI note for history - to consider a trial of citrucel if GI symptoms return but at present he is stable  AKI on CKD stage IIIa Creatinine 2.69 on admission - baseline creatinine approximately 1.2 - held  Aldactone  valsartan  and Farxiga  initially due to hypotension - renal US  without acutely reversible issues and consistent with medical renal disease - renal function steadily improved w/ volume resuscitation and avoidance of  possible nephrotoxic drugs, but pt rapidly transitioned to volume overloaded state -renal function has now stabilized  Recent Labs  Lab 10/15/23 0949 10/16/23 0301 10/17/23 0522 10/18/23 0316 10/19/23 1645  CREATININE 2.65* 2.68* 1.90* 1.52* 1.28*    BRBPR - suspected hemorrhoidal bleeding Has remained hemodynamically stable - hemoglobin slightly decreased from 13.4 presentation to 11.4 - colonoscopy 08/04/2019 noted internal hemorrhoids - cont with Anusol  suppositories - warfarin discontinued- this issues has essentially resolved at this time with improvement in INR  Hyperkalemia Received acute temporizing measures as well as Lokelma  initially - stable presently  Lactic acidosis Appears to have been simply due to hypovolemia - resolved with volume expansion  Atrial fibrillation on chronic warfarin  Rate controlled - continue metoprolol  - given overall picture I feel he is no longer an appropriate candidate for anticoag due to a high risk of bleeding complications/injury complicated by high likelihood of recurrent liver dysfunction leading to dangerous hypocoagulable state  Complete heart block status post PPM  CAD status post CABG x5 2018 Asymptomatic  Prolonged QTc QTc 521 ms during this admission - care with medication selection   Chronic thrombocytopenia Platelet count trending downward -no evidence of active bleeding presently -monitor trend  Underweight - Body mass index is 17.28 kg/m.   Family Communication: No family present at time of exam Disposition: Plan for discharge home 9/1 if remains stable overnight   Objective: Blood pressure 114/83, pulse (!) 101, temperature 97.8 F (36.6 C), temperature source Oral, resp. rate 16, height 6' 1 (1.854 m), weight 59.4 kg, SpO2 96%.  Intake/Output Summary (Last 24 hours) at 10/20/2023 0937 Last data filed at 10/19/2023 1340 Gross per 24 hour  Intake 240 ml  Output --  Net 240 ml   Filed Weights   10/14/23 1610   Weight: 59.4 kg    Examination: General: No acute respiratory distress Lungs: Clear to auscultation bilaterally without wheezes or crackles Cardiovascular: Regular rate and rhythm without murmur Abdomen: NT/ND, soft, BS positive, no rebound Extremities: No significant edema bilateral lower extremities   CBC: Recent Labs  Lab 10/14/23 1623 10/15/23 0423 10/17/23 0522 10/18/23 0316 10/19/23 1645  WBC 8.6   < > 8.0 6.9 6.1  NEUTROABS 6.9  --   --   --   --   HGB 13.4   < > 12.1* 11.9* 12.5*  HCT 41.4   < > 35.8* 35.9* 38.6*  MCV 99.0   < > 96.0 97.0 98.2  PLT 128*   < > 65* 76* 89*   < > = values in this interval not displayed.   Basic Metabolic Panel: Recent Labs  Lab 10/15/23 0949 10/16/23 0301 10/17/23 0522 10/18/23 0316 10/19/23 1645  NA 128*   < > 132* 135 134*  K 4.9   < > 4.4 4.4 3.9  CL 93*   < > 101 102 94*  CO2 18*   < > 23 24 29   GLUCOSE 146*   < > 95 118* 126*  BUN 53*   < > 47* 35* 30*  CREATININE 2.65*   < > 1.90* 1.52* 1.28*  CALCIUM  8.5*   < > 8.1* 8.1* 8.2*  MG 2.5*  --   --   --  1.9   < > = values in this interval not displayed.  GFR: Estimated Creatinine Clearance: 37.4 mL/min (A) (by C-G formula based on SCr of 1.28 mg/dL (H)).   Scheduled Meds:  amitriptyline   10 mg Oral QHS   lactose free nutrition  237 mL Oral TID WC   metoprolol  succinate  12.5 mg Oral QHS   sodium chloride  flush  3 mL Intravenous Q12H   venlafaxine  XR  37.5 mg Oral Q breakfast      LOS: 6 days   Reyes IVAR Moores, MD [facility] Office  [phone] Pager - Text Page per Tracey  If 7PM-7AM, please contact night-coverage per Amion 10/20/2023, 9:37 AM

## 2023-10-21 ENCOUNTER — Other Ambulatory Visit: Payer: Self-pay

## 2023-10-21 DIAGNOSIS — K625 Hemorrhage of anus and rectum: Secondary | ICD-10-CM | POA: Diagnosis not present

## 2023-10-21 LAB — CBC
HCT: 41.7 % (ref 39.0–52.0)
Hemoglobin: 13.6 g/dL (ref 13.0–17.0)
MCH: 31.8 pg (ref 26.0–34.0)
MCHC: 32.6 g/dL (ref 30.0–36.0)
MCV: 97.4 fL (ref 80.0–100.0)
Platelets: 98 K/uL — ABNORMAL LOW (ref 150–400)
RBC: 4.28 MIL/uL (ref 4.22–5.81)
RDW: 14.7 % (ref 11.5–15.5)
WBC: 6 K/uL (ref 4.0–10.5)
nRBC: 0 % (ref 0.0–0.2)

## 2023-10-21 LAB — COMPREHENSIVE METABOLIC PANEL WITH GFR
ALT: 378 U/L — ABNORMAL HIGH (ref 0–44)
AST: 121 U/L — ABNORMAL HIGH (ref 15–41)
Albumin: 3.2 g/dL — ABNORMAL LOW (ref 3.5–5.0)
Alkaline Phosphatase: 167 U/L — ABNORMAL HIGH (ref 38–126)
Anion gap: 11 (ref 5–15)
BUN: 28 mg/dL — ABNORMAL HIGH (ref 8–23)
CO2: 35 mmol/L — ABNORMAL HIGH (ref 22–32)
Calcium: 8.9 mg/dL (ref 8.9–10.3)
Chloride: 93 mmol/L — ABNORMAL LOW (ref 98–111)
Creatinine, Ser: 1.19 mg/dL (ref 0.61–1.24)
GFR, Estimated: >60 mL/min (ref >60)
Glucose, Bld: 100 mg/dL — ABNORMAL HIGH (ref 70–99)
Potassium: 4.5 mmol/L (ref 3.5–5.1)
Sodium: 139 mmol/L (ref 135–145)
Total Bilirubin: 3.7 mg/dL — ABNORMAL HIGH (ref 0.0–1.2)
Total Protein: 6 g/dL — ABNORMAL LOW (ref 6.5–8.1)

## 2023-10-21 LAB — PROTIME-INR
INR: 2 — ABNORMAL HIGH (ref 0.8–1.2)
Prothrombin Time: 23.9 s — ABNORMAL HIGH (ref 11.4–15.2)

## 2023-10-21 MED ORDER — FUROSEMIDE 40 MG PO TABS
40.0000 mg | ORAL_TABLET | Freq: Every day | ORAL | 0 refills | Status: DC
  Filled 2023-10-21: qty 90, 90d supply, fill #0

## 2023-10-21 MED ORDER — DAPAGLIFLOZIN PROPANEDIOL 10 MG PO TABS
10.0000 mg | ORAL_TABLET | Freq: Every day | ORAL | Status: DC
  Filled 2023-10-21: qty 1

## 2023-10-21 MED ORDER — APIXABAN 2.5 MG PO TABS
2.5000 mg | ORAL_TABLET | Freq: Two times a day (BID) | ORAL | 2 refills | Status: DC
  Filled 2023-10-21: qty 60, 30d supply, fill #0
  Filled 2023-11-18: qty 60, 30d supply, fill #1
  Filled 2023-12-22 – 2023-12-23 (×2): qty 60, 30d supply, fill #2

## 2023-10-21 MED ORDER — ATORVASTATIN CALCIUM 10 MG PO TABS
10.0000 mg | ORAL_TABLET | Freq: Every day | ORAL | Status: DC
  Filled 2023-10-21: qty 1

## 2023-10-21 MED ORDER — SPIRONOLACTONE 25 MG PO TABS
12.5000 mg | ORAL_TABLET | Freq: Every day | ORAL | 0 refills | Status: DC
  Filled 2023-10-21: qty 45, 90d supply, fill #0

## 2023-10-21 NOTE — Progress Notes (Signed)
 Norleen Murray to be D/C'd  per MD order.  Discussed with the patient and all questions fully answered.  VSS, Skin clean, dry and intact without evidence of skin break down, no evidence of skin tears noted.  IV catheter discontinued intact. Site without signs and symptoms of complications. Dressing and pressure applied.  An After Visit Summary was printed and given to the patient. Patient received prescription.  D/c education completed with patient/family including follow up instructions, medication list, d/c activities limitations if indicated, with other d/c instructions as indicated by MD - patient able to verbalize understanding, all questions fully answered.   Patient instructed to return to ED, call 911, or call MD for any changes in condition.   Patient to be escorted via WC, and D/C home via private auto.

## 2023-10-21 NOTE — Plan of Care (Signed)

## 2023-10-21 NOTE — Discharge Summary (Signed)
 DISCHARGE SUMMARY  Arlee Santosuosso  MR#: [id number]  DOB:[birth year]  Date of Admission: 10/14/2023 Date of Discharge: 10/21/2023  Attending Physician:Braylin Formby ONEIDA Moores, MD  Patient's ERE:[id number], Alm RAMAN, PA-C  Disposition: Discharge home  Follow-up Appts:  Follow-up Information     Tysinger, Alm RAMAN, PA-C Follow up in 5 day(s).   Specialty: Family Medicine Contact information: [address] [place] [address] [phone]         Rolan Ezra RAMAN, MD Follow up.   Specialty: Cardiology Why: The clinic will call you to arrange a follow up appointment. Contact information: [address] [place] [address] [place] [address] [phone]                 Tests Needing Follow-up: - Assess tolerance of newly initiated Eliquis  therapy (Per CHF team) - Recheck LFTs - Assess tolerance to resumed diuretics and ensure patient not experiencing episodes of hypotension - If weight loss persist despite compensated CHF consider CT chest to rule out occult malignancy  Discharge Diagnoses: Transaminitis -shock liver and congestive hepatopathy Supratherapeutic INR -warfarin toxicity Acute worsening of Chronic systolic CHF - ischemic cardiomyopathy Memory impairment/dementia - acute delirium Severe somnolence/lethargy Chronic persistent weight loss 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  Initial presentation: 83 year old with a history of memory loss, CAD, chronic systolic CHF, atrial fibrillation on warfarin, CKD stage IIIa, heart block status post pacemaker, sigmoid diverticulosis, hemorrhoids, and IBS who presented to the ER 8/25 with rectal bleeding described as bright red blood per rectum since 8/23 associated  with loss of appetite increased fatigue and exertional dyspnea.   Hospital Course:  Transaminitis LFTs found to be markedly elevated 8/28 with AST and ALT of 1215 and 1543 respectively - delayed Tylenol  level unremarkable - viral hepatitis studies negative - LFTs normal June 2025 - liver Doppler unremarkable - liver ultrasound revealed lobulated morphology suggestive of possible cirrhosis but no focal hepatic abnormality otherwise - the current suspicion is that this is liver failure related to right heart failure (congestive hepatopathy) as well as shock liver in the setting of severe systolic CHF and bouts of hypotension at home - no further GI workup planned for now with intention of managing heart failure and monitoring liver function for improvement - LFTs improving with stabilization of blood pressure   Last Labs        Recent Labs  Lab 10/17/23 0522 10/18/23 0316 10/19/23 1645 10/21/23 0727  AST 1,215* 530* 221* 121*  ALT 1,543* 1,052* 654* 378*  ALKPHOS 161* 168* 149* 167*  BILITOT 3.9* 3.7* 4.3* 3.7*  PROT 5.2* 5.1* 5.3* 6.0*  ALBUMIN  3.0* 2.8* 2.8* 3.2*        Supratherapeutic INR INR 4.0 at presentation and increased to peak of 9.0 during admission  -warfarin discontinued- dosed with vitamin K x 1 - INR slowly improving -appears to be due to acute hepatitis as noted above - INR now consistently trending downward    Acute worsening of Chronic systolic CHF - 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% via TTE 03/30/2023 - TTE this admit notes signif worsening of EF to 20-25% w/ global hypokinesis w/ severe mitral and tricuspid regurgitation - CHF Team directing care of this issue - oral diuretic has been resumed -discharge medications directed by CHF team   Memory  impairment/dementia - acute delirium Multifactorial related to underlying cognitive impairment complicated by hospitalization and acute illness - CT head without acute findings - ammonia level unremarkable -mental status has now  stabilized with the caveat that the patient has a propensity for mild agitation and uncooperativeness each morning   Severe somnolence/lethargy due to Elavil  being used for treatment of IBS - Elavil  has been discontinued - ammonia level normal -symptoms markedly improved with patient no longer lethargic   Chronic persistent weight loss Followed by GI in outpatient setting with decrease in weight from 240 pounds to 140 pounds unintentionally over a 6-year timeframe - CT chest in outpatient follow-up to rule out occult malignancy if continues - 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    IBS Long history of complicated, difficult to manage IBS - longstanding loose stools - see GI note for history - to consider a trial of citrucel if GI symptoms return but at present he is stable   AKI on CKD stage IIIa Creatinine 2.69 on admission - baseline creatinine approximately 1.2 - held Aldactone  valsartan  and Farxiga  initially due to hypotension - renal US  without acutely reversible issues and consistent with medical renal disease - renal function steadily improved w/ volume resuscitation and avoidance of possible nephrotoxic drugs, but pt rapidly transitioned to volume overloaded state - renal function has now stabilized with patient tolerating diuresis at time of discharge   Last Labs         Recent Labs  Lab 10/16/23 0301 10/17/23 0522 10/18/23 0316 10/19/23 1645 10/21/23 0727  CREATININE 2.68* 1.90* 1.52* 1.28* 1.19        BRBPR - suspected hemorrhoidal bleeding Has remained hemodynamically stable - hemoglobin slightly decreased from 13.4 presentation to 11.4 - colonoscopy 08/04/2019 noted internal hemorrhoids - warfarin discontinued- this issues has essentially resolved at this time with improvement in INR   Hyperkalemia Received acute temporizing measures as well as Lokelma  initially -potassium stable at time of discharge   Lactic acidosis Appears to have been simply due to hypovolemia -  resolved with volume expansion   Atrial fibrillation on chronic warfarin  Rate controlled - continue metoprolol  - given overall picture I feel he is no longer an appropriate candidate for anticoag due to a high risk of bleeding complications/injury complicated by high likelihood of recurrent liver dysfunction leading to dangerous hypocoagulable state - the CHF Team felt transition to Eliquis  was most appropriate, so this is to begin 9/2   Complete heart block status post PPM Evaluated by EP during this admission and not felt to be stable for changing to a biventricular pacemaker  CAD status post CABG x5 2018 Asymptomatic   Prolonged QTc QTc 521 ms during this admission - care with medication selection    Chronic thrombocytopenia Platelet count trending downward -no evidence of active bleeding presently -monitor trend   Underweight - Body mass index is 17.28 kg/m.  Allergies as of 10/21/2023   No Known Allergies      Medication List     STOP taking these medications    amitriptyline  25 MG tablet Commonly known as: ELAVIL    valsartan  40 MG tablet Commonly known as: DIOVAN    warfarin 5 MG tablet Commonly known as: COUMADIN        TAKE these medications    apixaban  2.5 MG Tabs tablet Commonly known as: ELIQUIS  Take 1 tablet (2.5 mg total) by mouth 2 (two) times daily. Start taking on: October 22, 2023   atorvastatin  10 MG tablet Commonly known as: LIPITOR  Take 1 tablet (10 mg total) by mouth daily. What changed: when to take this   desvenlafaxine  50 MG 24 hr tablet Commonly known as: Pristiq  Take 1 tablet (50 mg total) by mouth daily.   Farxiga  10 MG Tabs tablet Generic drug: dapagliflozin  propanediol Take 1 tablet (10 mg total) by mouth daily before breakfast.   furosemide  40 MG tablet Commonly known as: LASIX  Take 1 tablet (40 mg total) by mouth daily. Start taking on: October 22, 2023   metoprolol  succinate 25 MG 24 hr tablet Commonly known as:  TOPROL -XL Take 0.5 tablets (12.5 mg total) by mouth daily. Take 12.5mg  daily   spironolactone  25 MG tablet Commonly known as: ALDACTONE  Take 0.5 tablets (12.5 mg total) by mouth daily. Start taking on: October 22, 2023   Vitamin D  (Ergocalciferol ) 1.25 MG (50000 UNIT) Caps capsule Commonly known as: DRISDOL  Take 1 capsule (50,000 Units total) by mouth every 7 (seven) days.        Day of Discharge BP 106/64 (BP Location: Right Arm)   Pulse 93   Temp 98.6 F (37 C)   Resp 17   Ht 6' 1 (1.854 m)   Wt 59.4 kg   SpO2 96%   BMI 17.28 kg/m   Physical Exam: General: No acute respiratory distress Lungs: Clear to auscultation bilaterally without wheezes or crackles Cardiovascular: Regular rate and rhythm without murmur gallop or rub normal S1 and S2 Abdomen: Nontender, nondistended, soft, bowel sounds positive, no rebound, no ascites, no appreciable mass Extremities: No significant cyanosis, clubbing, or edema bilateral lower extremities  Basic Metabolic Panel: Recent Labs  Lab 10/15/23 0949 10/16/23 0301 10/17/23 0522 10/18/23 0316 10/19/23 1645 10/21/23 0727  NA 128* 130* 132* 135 134* 139  K 4.9 4.9 4.4 4.4 3.9 4.5  CL 93* 96* 101 102 94* 93*  CO2 18* 18* 23 24 29  35*  GLUCOSE 146* 106* 95 118* 126* 100*  BUN 53* 56* 47* 35* 30* 28*  CREATININE 2.65* 2.68* 1.90* 1.52* 1.28* 1.19  CALCIUM  8.5* 8.4* 8.1* 8.1* 8.2* 8.9  MG 2.5*  --   --   --  1.9  --     CBC: Recent Labs  Lab 10/14/23 1623 10/15/23 0423 10/16/23 0301 10/17/23 0522 10/18/23 0316 10/19/23 1645 10/21/23 0727  WBC 8.6   < > 10.9* 8.0 6.9 6.1 6.0  NEUTROABS 6.9  --   --   --   --   --   --   HGB 13.4   < > 12.3* 12.1* 11.9* 12.5* 13.6  HCT 41.4   < > 37.1* 35.8* 35.9* 38.6* 41.7  MCV 99.0   < > 97.4 96.0 97.0 98.2 97.4  PLT 128*   < > 76* 65* 76* 89* 98*   < > = values in this interval not displayed.    Time spent in discharge (includes decision making & examination of pt): 35  minutes  10/21/2023, 1:29 PM   Reyes IVAR Moores, MD [facility] Office  [phone]

## 2023-10-21 NOTE — Plan of Care (Signed)
   Problem: Clinical Measurements: Goal: Diagnostic test results will improve Outcome: Progressing   Problem: Activity: Goal: Risk for activity intolerance will decrease Outcome: Progressing   Problem: Nutrition: Goal: Adequate nutrition will be maintained Outcome: Progressing

## 2023-10-21 NOTE — Progress Notes (Signed)
 Patient refusing meds this morning. MD made aware./

## 2023-10-21 NOTE — Progress Notes (Signed)
 Patient ID: Jason Day, male   DOB: 1940-07-08, 83 y.o.   MRN: [id number]     Advanced Heart Failure Rounding Note  Cardiologist: Maude Emmer, MD  Chief Complaint: CHF Subjective:    No complaints today, no dyspnea. Eating breakfast.  Denies further rectal bleeding.  Hgb 13.6 today.    Objective:   Weight Range: 59.4 kg Body mass index is 17.28 kg/m.   Vital Signs:   Temp:  [97.8 F (36.6 C)-98.1 F (36.7 C)] 98 F (36.7 C) (09/01 0637) Pulse Rate:  [80-101] 94 (09/01 0637) Resp:  [15-18] 18 (09/01 0637) BP: (91-114)/(63-83) 103/71 (09/01 0637) SpO2:  [95 %-99 %] 95 % (09/01 0637) Last BM Date : 10/18/23  Weight change: Filed Weights   10/14/23 1610  Weight: 59.4 kg    Intake/Output:   Intake/Output Summary (Last 24 hours) at 10/21/2023 0807 Last data filed at 10/20/2023 1126 Gross per 24 hour  Intake 240 ml  Output --  Net 240 ml      Physical Exam    General:  Well appearing. No resp difficulty HEENT: Normal Neck: Supple. JVP not elevated. Carotids 2+ bilat; no bruits. No lymphadenopathy or thyromegaly appreciated. Cor: PMI nondisplaced. Irregular rate & rhythm. No rubs, gallops or murmurs. Lungs: Clear Abdomen: Soft, nontender, nondistended. No hepatosplenomegaly. No bruits or masses. Good bowel sounds. Extremities: No cyanosis, clubbing, rash. 1+ ankle edema.  Neuro: Alert & orientedx3, cranial nerves grossly intact. moves all 4 extremities w/o difficulty. Affect pleasant   Telemetry   Atrial fibrillation 90s (personally reviewed).   Labs    CBC Recent Labs    10/19/23 1645 10/21/23 0727  WBC 6.1 6.0  HGB 12.5* 13.6  HCT 38.6* 41.7  MCV 98.2 97.4  PLT 89* 98*   Basic Metabolic Panel Recent Labs    91/69/74 1645  NA 134*  K 3.9  CL 94*  CO2 29  GLUCOSE 126*  BUN 30*  CREATININE 1.28*  CALCIUM  8.2*  MG 1.9   Liver Function Tests Recent Labs    10/19/23 1645  AST 221*  ALT 654*  ALKPHOS 149*  BILITOT 4.3*  PROT 5.3*  ALBUMIN   2.8*   No results for input(s): LIPASE, AMYLASE in the last 72 hours. Cardiac Enzymes No results for input(s): CKTOTAL, CKMB, CKMBINDEX, TROPONINI in the last 72 hours.  BNP: BNP (last 3 results) Recent Labs    03/30/23 0222 09/03/23 0931 10/15/23 1548  BNP 1,485.4* 758.3* 894.2*    ProBNP (last 3 results) No results for input(s): PROBNP in the last 8760 hours.   D-Dimer No results for input(s): DDIMER in the last 72 hours. Hemoglobin A1C No results for input(s): HGBA1C in the last 72 hours. Fasting Lipid Panel No results for input(s): CHOL, HDL, LDLCALC, TRIG, CHOLHDL, LDLDIRECT in the last 72 hours. Thyroid  Function Tests No results for input(s): TSH, T4TOTAL, T3FREE, THYROIDAB in the last 72 hours.  Invalid input(s): FREET3  Other results:   Imaging    No results found.   Medications:     Scheduled Medications:  atorvastatin   10 mg Oral Daily   dapagliflozin  propanediol  10 mg Oral Daily   furosemide   40 mg Oral Daily   lactose free nutrition  237 mL Oral TID WC   metoprolol  succinate  12.5 mg Oral QHS   sodium chloride  flush  3 mL Intravenous Q12H   venlafaxine  XR  37.5 mg Oral Q breakfast    Infusions:   PRN Medications: hydrocortisone , LORazepam   Assessment/Plan   1. Acute on Chronic Systolic Heart Failure, w/ Worsening Biventricular Failure: H/o ischemic CMP. Patient had CABG x 5 in 8/18. Post-op echo showed EF 35%.  Since that time, EF had mostly ranged from 25-30% but did improve to 45-50% on echo 2/25. s/p Biotronik PPM with LB lead 2/25 for CHB.  Echo this admit EF 20-25% diffuse HK with basal septal aneurysm, RV mod reduced, severe MR and TR. No chest pain, doubt ACS.  Concern for pacemaker-medicated cardiomyopathy with wide QRS (186 msec) despite left bundle pacing. Seen by EP, not a candidate for transition to BiV pacemaker given overall picture per Dr. Cindie.  He does not look volume overloaded  on exam today, initially received IV Lasix  and now on po.  SBP 90s-100s.  Creatinine 1.28, actually lower than prior outpatient creatinine. Low BP will limit GDMT.  - Continue Lasix  40 mg po daily.  - Restart Farxiga  10 mg daily.  - Continue Toprol  XL 12.5 daily.  - Can add spironolactone  12.5 daily tomorrow.   2. Transaminitis: This may be due to hepatic congestion from RV failure as well as hypotension prior to admission.  LFTs now trending down.  3. AKI on CKD IIIb: Baseline creatinine ~1.5.  SCr 2.7 on admission. Suspect cardiorenal syndrome.  Now improved, creatinine 1.28 today (below prior baseline).  - Avoid hypotension.  - Restart Farxiga  today.   4. Rectal Bleeding: Due to internal hemorrhoids + supratherapeutic INR (9).  GI has seen. Bleeding resolved. Hgb 12.5 today.  5. Supratherapeutic INR: Due to warfarin use + hepatic dysfunction. Reversed w/ Vit K. I do not think that he is a good warfarin candidate going forwards.  - When INR < 2, would start him on Eliquis  2.5 mg bid (lower dosing for age, wt < 60 kg).  6. Atrial fibrillation: Permanent.  Rate control acceptable.  - Continue current Toprol  XL 12.5 daily.  - Start Eliquis  2.5 mg bid when INR < 2.  7. CAD: h/o CABG.  Denies CP. No ACS this admission.  - No ASA as will be on Eliquis .  - Add back his statin with fall in LFTs.  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  HF optimization w/ diuresis.   9. Suspect dementia.  10. Cachexia: Chronic persistent weight loss.  GI follows, has IBS.   Will need close followup in CHF clinic when goes home.   Length of Stay: 7  Ezra Shuck, MD  10/21/2023, 8:07 AM  Advanced Heart Failure Team Pager [phone] (M-F; 7a - 5p)  Please contact [facility] for night-coverage after hours (5p -7a ) and weekends on [email or web address]

## 2023-10-21 NOTE — Evaluation (Signed)
 Occupational Therapy Evaluation Patient Details Name: Jason Day MRN: [id number] DOB: [birth year] Today's Date: 10/21/2023   History of Present Illness   Pt is an 83 y.o. male presenting 8/25 with BRBPR, loss of appetite, fatigue, DOE. Workup for suspected hemorrhoidal bleed, hyperkalemia, lactic acidosis. PMH: memory loss, CAD, CHF, afib on warfarin, CKD IIIa, heart block s/p pacemaker, sigmoid diverticulosis, hemorrhoids, IBS.     Clinical Impressions Prior to this admission, patient independent, driving, and did not use AD for ambulation. Patient frustrated upon OT session, with OT providing listening ear. Patient supervision for bed mobility, and ambulating at a slow and guarded pace with min A (safety as patient refused device) for functional household distances. Patient with no further acute OT needs, with OT signing off. Please re-consult OT if acute needs arise.      If plan is discharge home, recommend the following:   Help with stairs or ramp for entrance;A little help with walking and/or transfers (initially)     Functional Status Assessment   Patient has had a recent decline in their functional status and demonstrates the ability to make significant improvements in function in a reasonable and predictable amount of time.     Equipment Recommendations   None recommended by OT     Recommendations for Other Services         Precautions/Restrictions   Precautions Precautions: Fall Recall of Precautions/Restrictions: Impaired Restrictions Weight Bearing Restrictions Per Provider Order: No     Mobility Bed Mobility Overal bed mobility: Needs Assistance Bed Mobility: Supine to Sit     Supine to sit: Supervision          Transfers Overall transfer level: Needs assistance Equipment used: None Transfers: Sit to/from Stand, Bed to chair/wheelchair/BSC Sit to Stand: Contact guard assist           General transfer comment: CGA to rise, close min A  when patient completing mobility in hallway due to DGI from PT session yesterday      Balance Overall balance assessment: Needs assistance Sitting-balance support: No upper extremity supported, Feet supported Sitting balance-Leahy Scale: Good     Standing balance support: No upper extremity supported, During functional activity Standing balance-Leahy Scale: Fair Standing balance comment: did not challenge                           ADL either performed or assessed with clinical judgement   ADL Overall ADL's : At baseline                                       General ADL Comments: Prior to this admission, patient independent, driving, and did not use AD for ambulation. Patient frustrated upon OT session, with OT providing listening ear. Patient supervision for bed mobility, and ambulating at a slow and guarded pace with min A (safety as patient refused device) for functional household distances. Patient with no further acute OT needs, with OT signing off. Please re-consult OT if acute needs arise.     Vision Baseline Vision/History: 0 No visual deficits Ability to See in Adequate Light: 0 Adequate Patient Visual Report: No change from baseline Vision Assessment?: No apparent visual deficits     Perception Perception: Not tested       Praxis Praxis: Not tested       Pertinent Vitals/Pain       Extremity/Trunk  Assessment Upper Extremity Assessment Upper Extremity Assessment: Right hand dominant;Overall [facility] for tasks assessed   Lower Extremity Assessment Lower Extremity Assessment: Defer to PT evaluation;Overall [facility] for tasks assessed   Cervical / Trunk Assessment Cervical / Trunk Assessment: Kyphotic   Communication Communication Communication: Impaired Factors Affecting Communication: Hearing impaired   Cognition Arousal: Alert Behavior During Therapy: WFL for tasks assessed/performed Cognition: No apparent impairments                                Following commands: Impaired Following commands impaired: Follows one step commands inconsistently (due to [facility])     Cueing  General Comments   Cueing Techniques: Verbal cues      Exercises     Shoulder Instructions      Home Living Family/patient expects to be discharged to:: Private residence Living Arrangements: Spouse/significant other Available Help at Discharge: Family;Available 24 hours/day Type of Home: House Home Access: Stairs to enter [employer] of Steps: 3 stairs at front, flat in garage   Home Layout: Two level;Bed/bath upstairs Alternate Level Stairs-Number of Steps: flight Alternate Level Stairs-Rails: Left Bathroom Shower/Tub: Producer, television/film/video: Standard     Home Equipment: None          Prior Functioning/Environment Prior Level of Function : Independent/Modified Independent;Driving             Mobility Comments: no DME, active in yard, no falls, later pt admits to furniture walking in home and slight LOB when moving quickly ADLs Comments: independent, wife does IADLs, pt drives and manages medications    OT Problem List: Decreased activity tolerance;Impaired balance (sitting and/or standing)   OT Treatment/Interventions:        OT Goals(Current goals can be found in the care plan section)   Acute Rehab OT Goals Patient Stated Goal: to get all this sorted OT Goal Formulation: With patient Time For Goal Achievement: 11/04/23 Potential to Achieve Goals: Good   OT Frequency:       Co-evaluation              AM-PAC OT 6 Clicks Daily Activity     Outcome Measure Help from another person eating meals?: None Help from another person taking care of personal grooming?: None Help from another person toileting, which includes using toliet, bedpan, or urinal?: None Help from another person bathing (including washing, rinsing, drying)?: None Help from another person to put on and  taking off regular upper body clothing?: None Help from another person to put on and taking off regular lower body clothing?: None 6 Click Score: 24   End of Session Equipment Utilized During Treatment: Gait belt Nurse Communication: Mobility status  Activity Tolerance: Patient tolerated treatment well Patient left: in bed;with call bell/phone within reach;with bed alarm set  OT Visit Diagnosis: Other abnormalities of gait and mobility (R26.89);Muscle weakness (generalized) (M62.81)                Time: 8893-8875 OT Time Calculation (min): 18 min Charges:  OT General Charges $OT Visit: 1 Visit OT Evaluation $OT Eval Moderate Complexity: 1 Mod  Ronal Gift E. Rylin Seavey, OTR/L Acute Rehabilitation Services [phone]   Ronal Gift Salt 10/21/2023, 1:44 PM

## 2023-10-22 ENCOUNTER — Telehealth: Payer: Self-pay | Admitting: Medical

## 2023-10-22 ENCOUNTER — Other Ambulatory Visit: Payer: Self-pay

## 2023-10-22 ENCOUNTER — Telehealth: Payer: Self-pay | Admitting: Student

## 2023-10-22 NOTE — Telephone Encounter (Signed)
 Pt is worried about seeing new provider regarding his pacemaker and would like Dr. Cindie and Dr. Lesia to be on the same page about pt upgrading pacemaker. Please advise.

## 2023-10-22 NOTE — Telephone Encounter (Signed)
 Copied from CRM [id number]. Topic: Appointments - Appointment Cancel/Reschedule >> Oct 22, 2023  8:34 AM Myrick T wrote: Patient/patient representative is calling to cancel or reschedule an appointment. Refer to attachments for appointment information. Patient said hr just got out the hospital and was taken off his medication so he did not need the labs. Offered HFU but patient refused saying his doctor did not tell him he needed one.

## 2023-10-22 NOTE — Telephone Encounter (Signed)
 Patient identification verified by 2 forms.   Called and spoke to patient  Patient states:  -Pt was calling to verify he has an appt on the 15th. -Appt confirmed, no further questions at this time.

## 2023-10-23 ENCOUNTER — Ambulatory Visit (INDEPENDENT_AMBULATORY_CARE_PROVIDER_SITE_OTHER): Admitting: Otolaryngology

## 2023-10-23 ENCOUNTER — Other Ambulatory Visit (HOSPITAL_COMMUNITY): Payer: Self-pay

## 2023-10-23 ENCOUNTER — Other Ambulatory Visit

## 2023-10-23 ENCOUNTER — Ambulatory Visit (INDEPENDENT_AMBULATORY_CARE_PROVIDER_SITE_OTHER): Admitting: Audiology

## 2023-10-23 ENCOUNTER — Encounter (INDEPENDENT_AMBULATORY_CARE_PROVIDER_SITE_OTHER): Payer: Self-pay | Admitting: Otolaryngology

## 2023-10-23 VITALS — BP 128/86 | HR 59 | Ht 73.0 in

## 2023-10-23 DIAGNOSIS — Z87891 Personal history of nicotine dependence: Secondary | ICD-10-CM

## 2023-10-23 DIAGNOSIS — J343 Hypertrophy of nasal turbinates: Secondary | ICD-10-CM

## 2023-10-23 DIAGNOSIS — R0981 Nasal congestion: Secondary | ICD-10-CM | POA: Diagnosis not present

## 2023-10-23 DIAGNOSIS — H903 Sensorineural hearing loss, bilateral: Secondary | ICD-10-CM

## 2023-10-23 DIAGNOSIS — J310 Chronic rhinitis: Secondary | ICD-10-CM

## 2023-10-23 DIAGNOSIS — J300 Vasomotor rhinitis: Secondary | ICD-10-CM | POA: Diagnosis not present

## 2023-10-23 DIAGNOSIS — H9313 Tinnitus, bilateral: Secondary | ICD-10-CM

## 2023-10-23 DIAGNOSIS — R2689 Other abnormalities of gait and mobility: Secondary | ICD-10-CM | POA: Diagnosis not present

## 2023-10-23 DIAGNOSIS — T485X5A Adverse effect of other anti-common-cold drugs, initial encounter: Secondary | ICD-10-CM | POA: Diagnosis not present

## 2023-10-23 NOTE — Progress Notes (Signed)
 Dear Dr. Bulah, Here is my assessment for our mutual patient, Jason Day. Thank you for allowing me the opportunity to care for your patient. Please do not hesitate to contact me should you have any other questions. Sincerely, Dr. Eldora Blanch  Otolaryngology Clinic Note Referring provider: Dr. Bulah HPI:  Jason Day is a 83 y.o. male kindly referred by Dr. Bulah for evaluation of imbalance and hearing loss.  Initial visit (08/2023): Patient reports: noted chronic bilateral tinnitus and hearing loss, ongoing for several years. Some worsening, and now he is having a harder time understanding people, especially his wife. Non-pulsatile tinnitus. No recent hearing test, unsure when he had a hearing test prior (not in the last 7 ears). 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  He also reports that he has some disequilibrium when he stands up. Occurs intermittently - most of the time he gets better in a few seconds-min. No room spinning vertigo, does not occur when he turns his head or turns over in bed. Leaning down sometimes makes things better. Ongoing for last year or so. No prior balance testing. He reports having some issues with memory, more forgetful. No presyncopal episodes.   Of note, he also sees Dr. Lorin for chronic rhinitis. He reports that his nose drips like a faucet when he eats or in the morning. He does have chronic nasal congestion for which he uses afrin nightly. He denies frequent sinus infections, facial pressure/pain, discolored drainage or hyposmia. He tried atrovent  but has not used it consistently. Unclear if it helps. No significant PND. No recent medication changes. He has had prior allergy testing  Patient denies: ear pain, fullness, frank vertigo, drainage Patient additionally denies: deep pain in ear canal, eustachian tube symptoms such as popping/crackling, sensitive to pressure changes Patient also denies barotrauma, vestibular suppressant use,  ototoxic medication use Prior ear surgery: no  --------------------------------------------------------- 10/23/2023 He reports that he went to [facility] and he had his vertigo/dysequilibrium treated (rotary chair v/s epley?) and doing well now. Recent admission for transaminitis and BRBPR, now discharged. He continues to report hearing loss and did have an audiogram today. Rhinitis about the same, has not used atrovent . Continues to use afrin. No nasal infections.   ENT Surgery: no  Personal or FHx of bleeding dz or anesthesia difficulty: no  AP/AC: Coumadin   Tobacco: former, quit (1970)  PMHx: Chronic rhinitis, Bradycardia s/p pacemaker, CAD s/p CABG, PAF, MR, HLD, HF, Memory impairment  Independent Review of Additional Tests or Records:  Dr. Lorin notes (05/24/2023): noted congestion after meals (river flows out of me. Noted lightheadedness on standing, no vertigo; being on a boat. H/o BPPV, noted hearing difficulties; uses HA; Dx: Vasomotor rhinitis; Rx: atrovent , ref to ENT for vertigo/imbalance BMP 09/03/2023: BUN/Cr 35/1.74; CBC 04/01/2023: no anemia CTH 02/11/2022 independently interpreted with respect to ears: cuts thick so suboptimal eval but mastoids and ME well aerated; no noted ossicular chain or otic capsule lesions CTH 10/15/2023 independently interpreted with respect to ears and sinuses: paranasal sinuses clear, right septal deviation, mastoids and ME well aerated, no otic ossicular chain pathology noted 10/2023 Audiogram was independently reviewed and interpreted by me and it reveals - A/A tymps; b/l mild downsloping to sev SNHL; WRT AD/AS 80/84% at 85dB AU  SNHL= Sensorineural hearing loss  PMH/Meds/All/SocHx/FamHx/ROS:   Past Medical History:  Diagnosis Date   Acute combined systolic and diastolic congestive heart failure (HCC) 10/16/2016   Allergic rhinitis 09/01/2020   Anal fissure    Angio-edema  Bleeding hemorrhoids    Chronic combined systolic and diastolic heart  failure (HCC)    Chronic sinus complaints    overuses afrin   Coronary atherosclerosis of native coronary artery    Dyspnea    Encounter for medical examination to establish care 04/22/2017   Gallstones    Hyperlipidemia    Irritable bowel syndrome 06/07/2017   Ischemic cardiomyopathy    Left atrial enlargement    Long term (current) use of anticoagulants 12/30/2020   Moderate mitral regurgitation    Myocardial infarction (HCC) 09/2015   Persistent atrial fibrillation (HCC)    RBBB    A- Fib   Shortness of breath 12/29/2020   Urinary incontinence 12/30/2020     Past Surgical History:  Procedure Laterality Date   BACK SURGERY  ~2014   disc repair   CARDIOVERSION N/A 10/24/2018   Procedure: CARDIOVERSION;  Surgeon: Jeffrie Oneil BROCKS, MD;  Location: [facility] ENDOSCOPY;  Service: Cardiovascular;  Laterality: N/A;   CHOLECYSTECTOMY     COLONOSCOPY  04/06/2014   Hemorrhoids and diverticulosis performed in [place]    COLONOSCOPY WITH PROPOFOL  N/A 08/04/2019   Procedure: COLONOSCOPY WITH PROPOFOL ;  Surgeon: Lennard Lesta FALCON, MD;  Location: [facility] ENDOSCOPY;  Service: Endoscopy;  Laterality: N/A;   CORONARY ARTERY BYPASS GRAFT N/A 10/18/2016   Procedure: CORONARY ARTERY BYPASS GRAFTING (CABG) x five , using left internal mammary artery and right leg greater saphenous vein harvested endoscopically;  Surgeon: Lucas Dorise POUR, MD;  Location: [facility] OR;  Service: Open Heart Surgery;  Laterality: N/A;   ESOPHAGOGASTRODUODENOSCOPY (EGD) WITH PROPOFOL  N/A 08/04/2019   Procedure: ESOPHAGOGASTRODUODENOSCOPY (EGD) WITH PROPOFOL ;  Surgeon: Lennard Lesta FALCON, MD;  Location: [facility] ENDOSCOPY;  Service: Endoscopy;  Laterality: N/A;   FOOT SURGERY     HEMORRHOID BANDING     HEMORRHOID SURGERY     PACEMAKER IMPLANT N/A 04/02/2023   Procedure: PACEMAKER IMPLANT;  Surgeon: Cindie Ole DASEN, MD;  [facility] CV LAB;  Service: Cardiovascular;  Laterality: N/A;   REVERSE SHOULDER ARTHROPLASTY Left 08/21/2018   Procedure:  REVERSE SHOULDER ARTHROPLASTY;  Surgeon: Melita Drivers, MD;  Location: [facility] ORS;  Service: Orthopedics;  Laterality: Left;   RIGHT/LEFT HEART CATH AND CORONARY ANGIOGRAPHY N/A 10/16/2016   Procedure: RIGHT/LEFT HEART CATH AND CORONARY ANGIOGRAPHY;  Surgeon: Claudene Victory ORN, MD;  Location: [facility] INVASIVE CV LAB;  Service: Cardiovascular;  Laterality: N/A;   stents in liver     TEE WITHOUT CARDIOVERSION N/A 10/18/2016   Procedure: TRANSESOPHAGEAL ECHOCARDIOGRAM (TEE);  Surgeon: Lucas Dorise POUR, MD;  Location: [facility] OR;  Service: Open Heart Surgery;  Laterality: N/A;   TRANSURETHRAL RESECTION OF PROSTATE N/A 11/19/2019   Procedure: CYSTOSCOPY TRANSURETHRAL RESECTION OF THE PROSTATE (TURP);  Surgeon: Watt Rush, MD;  Location: [facility] ORS;  Service: Urology;  Laterality: N/A;    Family History  Problem Relation Age of Onset   COPD Father    Emphysema Father    Healthy Brother    Diabetes Neg Hx    Cancer Neg Hx    Stomach cancer Neg Hx    Colon cancer Neg Hx    Allergic rhinitis Neg Hx    Angioedema Neg Hx    Asthma Neg Hx    Eczema Neg Hx    Urticaria Neg Hx      Social Connections: Moderately Isolated (10/15/2023)   Social Connection and Isolation Panel    Frequency of Communication with Friends and Family: Never    Frequency of Social Gatherings with Friends and Family: Twice  a week    Attends Religious Services: More than 4 times per year    Active Member of Clubs or Organizations: No    Attends Banker Meetings: Never    Marital Status: Married      Current Outpatient Medications:    apixaban  (ELIQUIS ) 2.5 MG TABS tablet, Take 1 tablet (2.5 mg total) by mouth 2 (two) times daily., Disp: 60 tablet, Rfl: 2   atorvastatin  (LIPITOR) 10 MG tablet, Take 1 tablet (10 mg total) by mouth daily. (Patient taking differently: Take 10 mg by mouth at bedtime.), Disp: 90 tablet, Rfl: 3   dapagliflozin  propanediol (FARXIGA ) 10 MG TABS tablet, Take 1 tablet (10 mg total) by mouth daily before  breakfast., Disp: 90 tablet, Rfl: 3   desvenlafaxine  (PRISTIQ ) 50 MG 24 hr tablet, Take 1 tablet (50 mg total) by mouth daily., Disp: 30 tablet, Rfl: 1   furosemide  (LASIX ) 40 MG tablet, Take 1 tablet (40 mg total) by mouth daily., Disp: 90 tablet, Rfl: 0   metoprolol  succinate (TOPROL -XL) 25 MG 24 hr tablet, Take 0.5 tablets (12.5 mg total) by mouth daily. Take 12.5mg  daily, Disp: 45 tablet, Rfl: 3   spironolactone  (ALDACTONE ) 25 MG tablet, Take 0.5 tablets (12.5 mg total) by mouth daily., Disp: 90 tablet, Rfl: 0   Vitamin D , Ergocalciferol , (DRISDOL ) 1.25 MG (50000 UNIT) CAPS capsule, Take 1 capsule (50,000 Units total) by mouth every 7 (seven) days., Disp: 4 capsule, Rfl: 3   Physical Exam:   BP 128/86 (BP Location: Right Arm, Patient Position: Sitting, Cuff Size: Normal)   Pulse (!) 59   Ht 6' 1 (1.854 m)   SpO2 91%   BMI 17.28 kg/m   Salient findings:  CN II-XII intact Bilateral EAC mild non-obstructing cerumen and TM intact with well pneumatized middle ear spaces Weber 512: mid Rinne 512: AC > BC b/l  Anterior rhinoscopy: Septum modest dev right; bilateral inferior turbinates with modest hypertrophy; mild global erythema suggestive of rhinitis medicamentosa No lesions of oral cavity/oropharynx  Seprately Identifiable Procedures:  Prior to initiating any procedures, risks/benefits/alternatives were explained to the patient and verbal consent obtained. None today   Impression & Plans:  Jason Day is a 83 y.o. male with:  1. Imbalance   2. 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 Chronic vasomotor rhinitis   5. Rhinitis medicamentosa   6. Nasal congestion   7. 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 We discussed risks of afrin and rebound, but he wishes to continue with it. In addition, his other sx appear to be consistent with VM rhinitis -- not using atrovent  consistently still - recommend trial of atrovent  0.06% QID and see how he does  Thank you for allowing me the opportunity to care for your patient. Please do not hesitate to contact me should you have any other questions.  Sincerely, Eldora Blanch, MD Otolaryngologist (ENT), [facility] Phone: [phone] Fax: [phone]  10/23/2023, 1:12 PM   MDM:  Level 4 - 99214 Complexity/Problems addressed: mod - multiple chronic  problems Data complexity: mod - independent interpretation of CT imaging - Morbidity: low - Prescription Drug prescribed or managed: n

## 2023-10-23 NOTE — Progress Notes (Signed)
  [address], [address] [place], [place] [address] [phone]  Audiological Evaluation    Name: Jason Day     DOB:   May 11, 1940      MRN:   [id number]                                                                                     Service Date: 10/23/2023     Accompanied by: unaccompanied   Patient comes today after Dr. Tobie, ENT sent a referral for a hearing evaluation due to concerns with dizziness.   Symptoms Yes Details  Hearing loss  [x]  Hearing loss in both ears  Tinnitus  []    Ear pain/ infections/pressure  []    Balance problems  [x]  Reports vertigo - lats one was 8 years ago and was taken care of by putting him upside down  in [place] .  Noise exposure history  []    Previous ear surgeries  []    Family history of hearing loss  []    Amplification  []  Reports uses a $100 amplifier  Other  []      Otoscopy: Right ear: Non-occluding cerumen, able to visualize some tympanic membrane landmarks. Left ear:  Non-occluding cerumen, able to visualize some tympanic membrane landmarks.  Tympanometry: Right ear: Type A- Normal external ear canal volume with normal middle ear pressure and tympanic membrane compliance. Left ear: Type A- Normal external ear canal volume with normal middle ear pressure and tympanic membrane compliance.  Pure tone Audiometry: Both ears-  Mild to severe sensorineural hearing loss from 125 Hz - 8000 Hz.    Speech Audiometry: Right ear- Speech Reception Threshold (SRT) was obtained at 50 dBHL. Left ear-Speech Reception Threshold (SRT) was obtained at 50 dBHL.   Word Recognition Score Tested using NU-6 (recorded) Right ear: 80% was obtained at a presentation level of 85 dBHL with contralateral masking which is deemed as  good . Left ear: 84% was obtained at a presentation level of 85 dBHL with contralateral masking which is deemed as  good .   The hearing test results were completed under headphones and results are deemed to be of good to fair  reliability. Test technique:  conventional     Recommendations: Follow up with ENT as scheduled for today. Return for a hearing evaluation if concerns with hearing changes arise or per MD recommendation. Consider a communication needs assessment after medical clearance for hearing aids is obtained.   Nazarene Bunning MARIE LEROUX-MARTINEZ, AUD

## 2023-10-24 ENCOUNTER — Other Ambulatory Visit (HOSPITAL_COMMUNITY): Payer: Self-pay

## 2023-10-24 ENCOUNTER — Telehealth (HOSPITAL_COMMUNITY): Payer: Self-pay | Admitting: Cardiology

## 2023-10-24 NOTE — Telephone Encounter (Signed)
 Pt aware and voiced understanding  At the request of the patient meds sent via mychart message

## 2023-10-24 NOTE — Telephone Encounter (Signed)
 Patient called  States he has felt bad since hospital discharge. Reports increase in SOB, increase in weakness, and unable to sleep   Pt is is very winded during call  Reports he was told to stop all medications at discharge, has not taken any meds since 9/1  Confirmed he is not taking lasix , pt reports he has over 20 bottles and unsure if lasix  is in there.  Reports he does not have anyone to help with medications in the home  Meds are delivered  Reports  he is frustrated and not feeling well

## 2023-10-25 ENCOUNTER — Encounter: Payer: Self-pay | Admitting: Audiology

## 2023-10-28 ENCOUNTER — Ambulatory Visit (INDEPENDENT_AMBULATORY_CARE_PROVIDER_SITE_OTHER): Admitting: Medical

## 2023-10-28 ENCOUNTER — Telehealth (HOSPITAL_COMMUNITY): Payer: Self-pay

## 2023-10-28 VITALS — BP 102/62 | HR 54 | Wt 158.0 lb

## 2023-10-28 DIAGNOSIS — Z66 Do not resuscitate: Secondary | ICD-10-CM | POA: Diagnosis not present

## 2023-10-28 DIAGNOSIS — R630 Anorexia: Secondary | ICD-10-CM | POA: Diagnosis not present

## 2023-10-28 DIAGNOSIS — F320 Major depressive disorder, single episode, mild: Secondary | ICD-10-CM

## 2023-10-28 DIAGNOSIS — N1831 Chronic kidney disease, stage 3a: Secondary | ICD-10-CM

## 2023-10-28 DIAGNOSIS — R7401 Elevation of levels of liver transaminase levels: Secondary | ICD-10-CM

## 2023-10-28 DIAGNOSIS — R413 Other amnesia: Secondary | ICD-10-CM

## 2023-10-28 DIAGNOSIS — I5042 Chronic combined systolic (congestive) and diastolic (congestive) heart failure: Secondary | ICD-10-CM | POA: Diagnosis not present

## 2023-10-28 DIAGNOSIS — Z9181 History of falling: Secondary | ICD-10-CM | POA: Diagnosis not present

## 2023-10-28 DIAGNOSIS — I4821 Permanent atrial fibrillation: Secondary | ICD-10-CM | POA: Diagnosis not present

## 2023-10-28 DIAGNOSIS — Z8719 Personal history of other diseases of the digestive system: Secondary | ICD-10-CM

## 2023-10-28 DIAGNOSIS — Z23 Encounter for immunization: Secondary | ICD-10-CM | POA: Diagnosis not present

## 2023-10-28 NOTE — Telephone Encounter (Signed)
 Called to confirm/remind patient of their appointment [facility] on 10/29/23.   Appointment:   [] Confirmed  [x] Left mess   [] No answer/No voice mail  [] VM Full/unable to leave message  [] Phone not in service  And to bring in all medications and/or complete list.

## 2023-10-28 NOTE — Telephone Encounter (Signed)
 Wife left message on triage line in distress worrying about her husband. I called her and spoke with her at length about his condition. His is argumentative all the time and not eating or drinking much. Advised her to follow up with pcp for the mood issues and stated as long as he is eating and drinking a small bit is better than him not taking anything at all. She did state he is drinking 2 ensures a day. I told her if that that's all he will take for now it is better than nothing. She has follow up tomorrow with Dr.McLean.

## 2023-10-28 NOTE — Progress Notes (Incomplete)
 PCP: [name], PA-C Cardiology: Dr. Delford HF Cardiology: Dr. Rolan  83 y.o. with history of CAD s/p CABG, ischemic cardiomyopathy, and permanent atrial fibrillation referred by Dr. Delford for evaluation of CHF.  Patient had CABG x 5 in 8/18.  Post-op echo showed EF 35%. Since that time, LV EF has ranged from 25-35%.  Most recent echo in 1/24 showed EF 25-30%, moderate LV dilation, severely decreased RV systolic function, severe LAE, moderate MR. He had post-op atrial fibrillation.  He failed amiodarone  and cardioversion, and it was decided not to pursue AF ablation.  He is in permanent atrial fibrillation.  In 12/23, he had a presyncopal episode.  Zio monitor was placed showing AF rate ranging 29 - 100 bpm, 46 pauses with the longest 4 seconds (at night). Also 28 NSVT runs, longest 18 beats.   Seen for initial HF visit in 02/24. Volume stable. Started on Farxiga .   He was admitted in 2/25 with bradycardia, suspected atrial fibrillation with complete heart block.  Biotronik PPM with left bundle lead was placed (he had not wanted ICD in past and now of advanced age). Echo in 2/25 showed EF 45-50%, basal inferior aneurysm, RV low normal function, severe LAE, IVC normal, moderate MR (possibly infarct-related).   He is here today for follow-up. He has been doing fairly well.  Dyspnea noted walking up stairs.  However, he can do yardwork and walk on flat ground without problems.  No orthopnea/PND.  No lightheadedness unless he bends over for a long time then stands up.  No chest pain.    Labs (1/24): K 4.6, creatinine 1.15 Labs (6/25): K 4.5, creatinine 1.16  ECG (personally reviewed): Atrial fibrillation with V-pacing  PMH:  1. CAD: S/p CABG 8/18 with LIMA-LAD, SVG-D2, SVG-OM1, seq SVG-OM2 and OM3.  2. Atrial fibrillation: Post-op CABG initially.  Failed amiodarone  and cardioversion. Decided against ablation in 2020. Now permanent.  3. Chronic systolic CHF: Ischemic cardiomyopathy.  - Echo  (1/19): EF 35% - Echo (2020): EF 30-35% - Echo (8/22): EF 30-35%, moderate-severe MR - Echo (1/24): EF 25-30%, moderate LV dilation, severely decreased RV systolic function, severe LAE, moderate MR.  - Echo (2/25): EF 45-50%, basal inferior aneurysm, RV low normal function, severe LAE, IVC normal, moderate MR (possibly infarct-related).  4. 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  Moderate-severe on 8/22 echo but moderate on 1/24 echo.  5. Bradycardia: Presyncopal episode in 12/23.  Zio monitor (1/24) with AF rate ranging 29 - 100 bpm, 46 pauses with the longest 4 seconds (at night).  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  6. NSVT: 28 NSVT runs on 1/24 Zio monitor, longest 18 beats.  7. Mild dementia 8. 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  Married.  Quit smoking remotely.  Rare ETOH.   Family History  Problem Relation Age of Onset   COPD Father    Emphysema Father    Healthy Brother    Diabetes Neg Hx    Cancer Neg Hx    Stomach cancer Neg Hx    Colon cancer Neg Hx    Allergic rhinitis Neg Hx    Angioedema Neg Hx    Asthma Neg Hx    Eczema Neg Hx    Urticaria Neg Hx    ROS: All systems reviewed and negative except as per HPI.   Current Outpatient Medications  Medication Sig Dispense Refill   apixaban  (ELIQUIS ) 2.5 MG TABS tablet Take 1  tablet (2.5 mg total) by mouth 2 (two) times daily. 60 tablet 2   dapagliflozin  propanediol (FARXIGA ) 10 MG TABS tablet Take 1 tablet (10 mg total) by mouth daily before breakfast. 90 tablet 3   desvenlafaxine  (PRISTIQ ) 50 MG 24 hr tablet Take 1 tablet (50 mg total) by mouth daily. 30 tablet 1   furosemide  (LASIX ) 40 MG tablet Take 1 tablet (40 mg total) by mouth daily. 90 tablet 0   metoprolol  succinate (TOPROL -XL) 25 MG 24 hr tablet Take 0.5 tablets (12.5 mg total) by mouth daily. Take 12.5mg  daily 45 tablet 3   spironolactone  (ALDACTONE ) 25 MG tablet Take 0.5 tablets (12.5 mg  total) by mouth daily. 90 tablet 0   No current facility-administered medications for this visit.   There were no vitals taken for this visit. General: NAD Neck: No JVD, no thyromegaly or thyroid  nodule.  Lungs: Clear to auscultation bilaterally with normal respiratory effort. CV: Nondisplaced PMI.  Heart regular S1/S2, no S3/S4, no murmur.  No peripheral edema.  No carotid bruit.  Normal pedal pulses.  Abdomen: Soft, nontender, no hepatosplenomegaly, no distention.  Skin: Intact without lesions or rashes.  Neurologic: Alert and oriented x 3.  Psych: Normal affect. Extremities: No clubbing or cyanosis.  HEENT: Normal.   Assessment/Plan: 1. Chronic systolic CHF: Ischemic cardiomyopathy.  He has a Biotronik PPM with left bundle lead.  He refused ICD in the past.   Echoes dating from 2018 have shown EF ranging 25-35%.  Echo in 1/24 showed EF 25-30%, moderate LV dilation, severely decreased RV systolic function, severe LAE, moderate MR. However, most recent echo in 2/25 showed EF up to 45-50%, basal inferior aneurysm, RV low normal function, severe LAE, IVC normal, moderate MR (possibly infarct-related).  NYHA class II symptoms, not volume overloaded on exam.  BP low today but he denies orthostatic symptoms.  - Continue Entresto  49/51 bid for now, but will check BMET today.  I will cut back to lower dose Entresto  if creatinine is elevated (given low BP).   - Continue Toprol  XL 25 daily.  - Continue spironolactone  25 mg daily.  - Continue Farxiga  10 mg daily.   - He does not appear to need Lasix .  2. NSVT: 28 NSVT runs on 1/24 Zio monitor, longest 18 beats.  - Continue Toprol  XL.  3. Atrial fibrillation: Permanent.  Failed amiodarone  and DCCV, and ablation was decided against.  No GI bleeding.   - Continue warfarin, DOACs not well-covered by his insurance.  4. Bradycardia: He had complete heart block in setting of AF in 2/25.  Now s/p placement of Biotronik PPM with left bundle lead.  5.  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  Most recent echo in 2/25 showed moderate MR. Follow for now.  6. CAD: CABG x 5 in 2018.  No chest pain.  - No ASA given stable CAD and warfarin use.  - Continue atorvastatin , check lipids today.   Followup 6 months with APP.   I spent 31 minutes reviewing records, interviewing/examining patient, and managing orders.   [name] HERO [facility] 10/28/2023

## 2023-10-28 NOTE — Telephone Encounter (Signed)
 Called wife about patient.  I saw him about a month ago but he has had hospitalization and a lot going on in the past month.  I wanted to see if he was taking the medicine we started last visit and wanted to see if he was doing better with appetite which is why we began the medication

## 2023-10-28 NOTE — Telephone Encounter (Signed)
 Pt coming in today

## 2023-10-28 NOTE — Progress Notes (Unsigned)
 Subjective:  Jason Day is a 83 y.o. male who presents for Chief Complaint  Patient presents with   Acute Visit    Very Confused. Not eating. Only eaten 1.5 hotdogs, cup of soup, 2 ensures, coffee and oatmeal with raisins in the last week. Weak, sleeping a lot. Got choked yesterday on food. Not hungry. Swelling in his ankles     Here with wife in follow-up.  History mostly by wife ,but some from patient.  When I last saw him on 09/26/2023 and he was not eating, was losing weight and wife wanted him to try something open given previous medications he had tried to help with appetite  In the meantime that he was hospitalized 10/14/2023 through 10/21/2023 for transaminitis, shock liver and congestive hepatopathy, supra therapeutic INR, worsening CHF, acute delirium amongst other diagnoses.  Since coming home from hospital, he feels no energy, tired, sleeping a lot, sometimes awakes confused.   No pain but feels bad in general.   Left foot staying swollen.   Although weight scale shows weight gain today, wife thinks he is thinner than he was a month ago.  Recent ultrasound showed heart pumping 20% compared to 40% prior.     At his last visit with me we started Pristiq  to help with appetite as he really was not seeing improvement on Megace .  That helped with mood and appetite until he had the rectal bleeding from the hemorrhoid which landed him in the hospital.  He was taken off of Coumadin  in the hospital.  He lives at home with his wife.  Some days he eats some, some days he eats nothing  He does have a shower chair for safety at home.  He walks daily.  They do check blood pressures and weights at home.  Recent blood pressure 110/87 and pulse 96 yesterday.  No other aggravating or relieving factors.    He is a DNR status, wife is DNR as well.  They have both had that paperwork at home for the last several years.  They lived in [place]  prior and had updated this paperwork every year  No  other c/o.    Past Medical History:  Diagnosis Date   Acute combined systolic and diastolic congestive heart failure (HCC) 10/16/2016   Allergic rhinitis 09/01/2020   Anal fissure    Angio-edema    Bleeding hemorrhoids    Chronic combined systolic and diastolic heart failure (HCC)    Chronic sinus complaints    overuses afrin   Coronary atherosclerosis of native coronary artery    Dyspnea    Encounter for medical examination to establish care 04/22/2017   Gallstones    Hyperlipidemia    Irritable bowel syndrome 06/07/2017   Ischemic cardiomyopathy    Left atrial enlargement    Long term (current) use of anticoagulants 12/30/2020   Moderate mitral regurgitation    Myocardial infarction (HCC) 09/2015   Persistent atrial fibrillation (HCC)    RBBB    A- Fib   Shortness of breath 12/29/2020   Urinary incontinence 12/30/2020   Current Outpatient Medications on File Prior to Visit  Medication Sig Dispense Refill   apixaban  (ELIQUIS ) 2.5 MG TABS tablet Take 1 tablet (2.5 mg total) by mouth 2 (two) times daily. 60 tablet 2   dapagliflozin  propanediol (FARXIGA ) 10 MG TABS tablet Take 1 tablet (10 mg total) by mouth daily before breakfast. 90 tablet 3   desvenlafaxine  (PRISTIQ ) 50 MG 24 hr tablet Take 1 tablet (50 mg  total) by mouth daily. 30 tablet 1   furosemide  (LASIX ) 40 MG tablet Take 1 tablet (40 mg total) by mouth daily. 90 tablet 0   metoprolol  succinate (TOPROL -XL) 25 MG 24 hr tablet Take 0.5 tablets (12.5 mg total) by mouth daily. Take 12.5mg  daily 45 tablet 3   spironolactone  (ALDACTONE ) 25 MG tablet Take 0.5 tablets (12.5 mg total) by mouth daily. 90 tablet 0   No current facility-administered medications on file prior to visit.     The following portions of the patient's history were reviewed and updated as appropriate: allergies, current medications, past family history, past medical history, past social history, past surgical history and problem  list.  ROS Otherwise as in subjective above    Objective: BP 102/62   Pulse (!) 54   Wt 158 lb (71.7 kg)   BMI 20.85 kg/m   Wt Readings from Last 3 Encounters:  10/28/23 158 lb (71.7 kg)  10/14/23 131 lb (59.4 kg)  09/26/23 143 lb 6.4 oz (65 kg)   BP Readings from Last 3 Encounters:  10/28/23 102/62  10/23/23 128/86  10/21/23 106/64   General appearance: alert, no distress, well developed, well nourished Neck: supple, no lymphadenopathy, no thyromegaly, no masses, no JVD Heart: RRR, normal S1, S2, faint 2/6 murmur Lungs: CTA bilaterally, no wheezes, rhonchi, or rales Abdomen: +bs, soft, non tender, non distended, no masses, no hepatomegaly, no splenomegaly Pulses: 2+ radial pulses, 2+ pedal pulses, normal cap refill Ext: Left ankle and foot with 1+ pitting edema, but no lower extremity with   Assessment: Encounter Diagnoses  Name Primary?   Transaminitis Yes   DNR (do not resuscitate)    Chronic combined systolic and diastolic heart failure (HCC)    CKD stage 3a, GFR 45-59 ml/min (HCC)    Depression, major, single episode, mild (HCC)    Permanent atrial fibrillation (HCC)    Appetite impaired    Memory impairment    Risk for falls    Needs flu shot    History of liver disease      Plan: I reviewed his recent hospitalization report, labs and findings.  Medicines reconciled.  We discussed that he has a DNR status.  We discussed that his fatigue and low energy likely has to do with his underlying health issues including heart failure, liver disease with recent transaminitis amongst other chronic disease in general  About a month ago we started Pristiq  to help with appetite, it has seemed to help out with both appetite and mood at least the first 2 weeks before you went to the hospital.  He will continue Pristiq  for now and will try to eat good nutrients throughout the day  We discussed risk for falls, uses a shower chair and other assistance device such as his  cane and guided walking  Counseled on the influenza virus vaccine.  Vaccine information sheet given.  Influenza vaccine given after consent obtained.  We discussed his recent weight fluctuations.  Not sure how to fully trust the weight readings in recent weeks but nevertheless it would suggest that he has gained some weight.  This could be a combination of fluid weight as well as better eating on the Pristiq   There is ankle swelling today but no frank ascites or obvious signs of generalized edema or anasarca  History of permanent A-fib, heart failure Follow-up with cardiology and electrophysiology in the next week or 2 as planned  Recent transaminitis was thought to be due to effects of heart  failure but he does apparently have a history of chronic liver disease going back to years ago, prior viral infection, acute kidney and liver failure and has a stent in place in the liver.   ***    Pacemaker tomorrow, then next week for cardio 11/04/23  12 years ago, flu, acute renal and liver failure  No GI f/u planned?  No prior hematology  Plan recheck 1 week   Weight and BP reading at home, reading in 1 week with labs   Counseled on the influenza virus vaccine.  Vaccine information sheet given.  Influenza vaccine given after consent obtained.    Bion was seen today for acute visit.  Diagnoses and all orders for this visit:  Transaminitis  DNR (do not resuscitate)  Chronic combined systolic and diastolic heart failure (HCC)  CKD stage 3a, GFR 45-59 ml/min (HCC)  Depression, major, single episode, mild (HCC)  Permanent atrial fibrillation (HCC)  Appetite impaired  Memory impairment  Risk for falls  Needs flu shot -     Flu vaccine HIGH DOSE PF(Fluzone Trivalent)  History of liver disease    Follow up: 1 week for labs, blood pressure readings and weight chart from home

## 2023-10-29 ENCOUNTER — Telehealth (HOSPITAL_COMMUNITY): Payer: Self-pay

## 2023-10-29 ENCOUNTER — Inpatient Hospital Stay (HOSPITAL_COMMUNITY)
Admission: RE | Admit: 2023-10-29 | Discharge: 2023-10-29 | Disposition: A | Discharge status: Home / Self Care | Source: Ambulatory Visit

## 2023-10-29 ENCOUNTER — Telehealth: Payer: Self-pay | Admitting: Internal Medicine

## 2023-10-29 ENCOUNTER — Other Ambulatory Visit: Payer: Self-pay | Admitting: Internal Medicine

## 2023-10-29 DIAGNOSIS — R634 Abnormal weight loss: Secondary | ICD-10-CM

## 2023-10-29 DIAGNOSIS — I4821 Permanent atrial fibrillation: Secondary | ICD-10-CM | POA: Diagnosis not present

## 2023-10-29 DIAGNOSIS — N1831 Chronic kidney disease, stage 3a: Secondary | ICD-10-CM | POA: Diagnosis not present

## 2023-10-29 DIAGNOSIS — Z9181 History of falling: Secondary | ICD-10-CM | POA: Diagnosis not present

## 2023-10-29 DIAGNOSIS — Z8719 Personal history of other diseases of the digestive system: Secondary | ICD-10-CM | POA: Diagnosis not present

## 2023-10-29 DIAGNOSIS — R2689 Other abnormalities of gait and mobility: Secondary | ICD-10-CM

## 2023-10-29 DIAGNOSIS — Z23 Encounter for immunization: Secondary | ICD-10-CM | POA: Diagnosis not present

## 2023-10-29 DIAGNOSIS — R7401 Elevation of levels of liver transaminase levels: Secondary | ICD-10-CM | POA: Diagnosis not present

## 2023-10-29 DIAGNOSIS — R413 Other amnesia: Secondary | ICD-10-CM | POA: Diagnosis not present

## 2023-10-29 DIAGNOSIS — R531 Weakness: Secondary | ICD-10-CM

## 2023-10-29 DIAGNOSIS — I5042 Chronic combined systolic (congestive) and diastolic (congestive) heart failure: Secondary | ICD-10-CM | POA: Diagnosis not present

## 2023-10-29 DIAGNOSIS — F320 Major depressive disorder, single episode, mild: Secondary | ICD-10-CM | POA: Diagnosis not present

## 2023-10-29 DIAGNOSIS — R630 Anorexia: Secondary | ICD-10-CM | POA: Diagnosis not present

## 2023-10-29 DIAGNOSIS — R410 Disorientation, unspecified: Secondary | ICD-10-CM

## 2023-10-29 DIAGNOSIS — Z66 Do not resuscitate: Secondary | ICD-10-CM | POA: Diagnosis not present

## 2023-10-29 NOTE — Telephone Encounter (Signed)
 Called to confirm/remind patient of their appointment at the [facility] on 10/30/23 2:30.   Appointment:   [x] Confirmed  [] Left mess   [] No answer/No voice mail  [] VM Full/unable to leave message  [] Phone not in service  Patient reminded to bring all medications and/or complete list.  Confirmed patient has transportation. Gave directions, instructed to utilize valet parking.

## 2023-10-29 NOTE — Progress Notes (Signed)
 [facility] NOTE  PCP: [name], PA-C Cardiology: Dr. Delford HF Cardiology: Dr. Rolan  HPI: 83 y.o. with history of CAD s/p CABG, ischemic cardiomyopathy, and permanent atrial fibrillation.  Patient had CABG x 5 in 8/18.  Post-op echo showed EF 35%. Since that time, LV EF has ranged from 25-35%. Echo in 1/24 showed EF 25-30%, moderate LV dilation, severely decreased RV systolic function, severe LAE, moderate MR. He had post-op atrial fibrillation. He failed amiodarone  and cardioversion, and it was decided not to pursue AF ablation.  He is in permanent atrial fibrillation.  In 12/23, he had a presyncopal episode.  Zio monitor was placed showing AF rate ranging 29 - 100 bpm, 46 pauses with the longest 4 seconds (at night). Also 28 NSVT runs, longest 18 beats.   Seen for initial HF visit in 2/24. Volume stable. Started on Farxiga .   He was admitted in 2/25 with bradycardia, suspected atrial fibrillation with complete heart block.  Biotronik PPM with left bundle lead was placed (he had not wanted ICD in past and now with advanced age). Echo in 2/25 showed EF 45-50%, basal inferior aneurysm, RV low normal function, severe LAE, IVC normal, moderate MR (possibly infarct-related).   Admitted 8/25 with rectal bleeding. GI consulted and felt 2/2 hemorrhoids. INR supratherapeutic on admit at 9.0. Coumadin  held. Labs showed significant transaminitis with AST and ALT greater than 1000. INR reversed w/ Vit K. Also w/ AKI, SCr 2.68 on admit (baseline ~1.6). Echo showed marked drop in LVEF since previous study 6 months ago, EF now 20-25% w/ global HK, mod concentric LVH. 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 AHF consulted, diuresed with IV lasix  and GDMT titrated. Drop in EF felt to be pacemaker-medicated cardiomyopathy with wide QRS (186 msec) despite left bundle pacing. Seen by EP, not a candidate for transition to BiV  pacemaker given overall picture per Dr. Cindie. Started on Eliquis  2.5 bid for [facility]. He was discharged home, weight 156 lbs.   He returns today for post hospital follow up with his wife. NYHA III. Overall feeling poorly. Has been struggling with lower extremity edema and lethargy since discharge from the hospital. Wearing compression socks. Weight has been fluctuant, but he has also been struggling with his appetite. Has been getting in some Ensures and soup. Per his wife, recently has struggled with intermittent confusion, clearer today. Ambulates minimally around the home, but able to get where he needs to go. Denies chest pain, palpitations, dizziness, and abnormal bleeding. Was instructed to take BP at home in both arms 3x daily. SBP runs around 90-110. Compliant with all medications, was instructed by PCP today to stop Lasix  and Farxiga  for a few days. They are both struggling with the numerous outpatient appointments/labs that he has scheduled, as it is very taxing on both him and his wife.   PMH:  1. CAD: S/p CABG 8/18 with LIMA-LAD, SVG-D2, SVG-OM1, seq SVG-OM2 and OM3.  2. Atrial fibrillation: Post-op CABG initially.  Failed amiodarone  and cardioversion. Decided against ablation in 2020. Now permanent.  3. Chronic systolic CHF: Ischemic cardiomyopathy.  - Echo (1/19): EF 35% - Echo (2020): EF 30-35% - Echo (8/22): EF 30-35%, moderate-severe MR - Echo (1/24): EF 25-30%, moderate LV dilation, severely decreased RV systolic function, severe LAE, moderate MR.  - Echo (2/25): EF 45-50%, basal inferior aneurysm, RV low normal function, severe LAE, IVC normal, moderate MR (possibly  infarct-related).  - Echo (8/25): EF 20-25% diffuse HK with basal septal aneurysm, RV mod reduced, severe MR and TR 4. 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  Moderate-severe on 8/22 echo but moderate on 1/24 echo.  - Echo 8/25: moderate MR 5. Bradycardia: Presyncopal episode in 12/23.  Zio monitor (1/24) with AF  rate ranging 29 - 100 bpm, 46 pauses with the longest 4 seconds (at night).  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  6. NSVT: 28 NSVT runs on 1/24 Zio monitor, longest 18 beats.  7. Mild dementia 8. 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  Married.  Quit smoking remotely.  Rare ETOH.   Family History  Problem Relation Age of Onset   COPD Father    Emphysema Father    Healthy Brother    Diabetes Neg Hx    Cancer Neg Hx    Stomach cancer Neg Hx    Colon cancer Neg Hx    Allergic rhinitis Neg Hx    Angioedema Neg Hx    Asthma Neg Hx    Eczema Neg Hx    Urticaria Neg Hx    ROS: All systems reviewed and negative except as per HPI.   Current Outpatient Medications  Medication Sig Dispense Refill   apixaban  (ELIQUIS ) 2.5 MG TABS tablet Take 1 tablet (2.5 mg total) by mouth 2 (two) times daily. 60 tablet 2   dapagliflozin  propanediol (FARXIGA ) 10 MG TABS tablet Take 1 tablet (10 mg total) by mouth daily before breakfast. 90 tablet 3   desvenlafaxine  (PRISTIQ ) 50 MG 24 hr tablet Take 1 tablet (50 mg total) by mouth daily. 30 tablet 1   furosemide  (LASIX ) 40 MG tablet Take 1 tablet (40 mg total) by mouth daily. 90 tablet 0   metoprolol  succinate (TOPROL -XL) 25 MG 24 hr tablet Take 0.5 tablets (12.5 mg total) by mouth daily. Take 12.5mg  daily 45 tablet 3   spironolactone  (ALDACTONE ) 25 MG tablet Take 0.5 tablets (12.5 mg total) by mouth daily. 90 tablet 0   No current facility-administered medications for this encounter.   BP 106/74   Pulse 66   Ht 6' (1.829 m)   Wt 69.1 kg (152 lb 6.4 oz)   SpO2 96%   BMI 20.67 kg/m   Physical Exam: General: Well appearing. No distress on RA. Arrived by [facility] Cardiac: JVP to jaw with TR reflux. S1 and S2 present. 2/6 systolic murmur Resp: Lung sounds coarse  Extremities: Warm and dry.  3+ BLE edema with pain. Wearing compression socks. Neuro: Alert and oriented x3. Affect flat.    Assessment/Plan: 1. Chronic Systolic Heart Failure, w/ Worsening Biventricular Failure: H/o ischemic CMP. Patient had CABG x 5 in 8/18. Post-op echo showed EF 35%.  Since that time, EF had mostly ranged from 25-30% but did improve to 45-50% on echo 2/25. s/p Biotronik PPM with LB lead 2/25 for CHB.  Echo this admit 8/25: EF 20-25% diffuse HK with basal septal aneurysm, RV mod reduced, severe MR and TR. No chest pain, doubt ACS.  Concern for pacemaker-medicated cardiomyopathy with wide QRS (186 msec) despite left bundle pacing. Seen by EP, not a candidate for transition to BiV pacemaker given overall picture per Dr. Cindie.   Volume significantly elevated on exam. SBP 90-110s, okay. BP will limit GDMT.  - Continue Lasix  40 mg daily, take twice daily for 3 days. Cr 1.19 on 9/1, will repeat in 10 days.  - Continue Farxiga  10  mg daily - Continue Toprol  XL 12.5 mg daily - Continue Spiro 12.5 mg daily - If continues to have hypotension would pull back spiro, OK with SBP in 90-110s. 2. Transaminitis: This may be due to hepatic congestion from RV failure as well as hypotension prior to admission. Small volume ascites noted on US . LFTs now trending down. Will repeat LFTs at next follow up.  3. CKD IIIb: Baseline creatinine ~1.5. Most recent Cr 1.19 on 9/1, will repeat BMET as above.  - Continue Farxiga .  - Avoid hypotension.  4. Rectal Bleeding: Due to internal hemorrhoids + supratherapeutic INR (9). Hgb stable >13. No s/s bleeding 5. Supratherapeutic INR: Due to warfarin use + hepatic dysfunction.  - Now off warfarin, on Eliquis  2.5 mg bid (lower dosing for age, wt < 60 kg).  6. Atrial fibrillation: Permanent. Rate control acceptable.  - Continue current Toprol  XL 12.5 daily.  - Continue Eliquis  7. CAD: h/o CABG.  Denies CP. No ACS this admission.  - No ASA as will be on Eliquis .  - Add back his statin with fall in LFTs.  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  HF optimization w/  diuresis.   9. Suspect dementia.  10. Cachexia: Body mass index is 20.67 kg/m.  - Chronic persistent weight loss - Has Ensures   - GI follows, has IBS  11. GOC: Both he and his wife are DNR. Wife is currently able to care for him at home without assistance. He currently does not want to participate in home PT, states that once the volume if off and he is feeling better he would be willing to try.   Will enroll in paramedicine to attempt to decrease the number of appointments they have to make it to.   Follow up in 2 weeks with APP (assess volume, check LFTs, BMP, and ammonia if lethargy/confusion has not resolved)  [place] Jameil Whitmoyer, NP 10/30/2023

## 2023-10-29 NOTE — Telephone Encounter (Unsigned)
 Copied from CRM [id number]. Topic: General - Other >> Oct 29, 2023  4:40 PM Selinda RAMAN wrote: Reason for CRM: [name] the spouse of the patient called in asking for Samule Nett nurse as she was told to report the patients blood pressure which is 100/69 and his pulse which is 64. After finding out she is supposed to just keep a log and bring it in next week I relayed that to her and she said that is ok and she will. She did however want to know if he is supposed to wear the compression socks day and night or exactly when? Please assist patient further.

## 2023-10-29 NOTE — Telephone Encounter (Signed)
 Please call wife   After reviewing back of the chart I have the following recommendations:  1- I recommend a referral to palliative care which can help with long-term chronic disease and see if there is other things they can with to assist in his care currently.  If agreeable we will put this referral in 2- his vitamin D  was low recently so I recommend he be on vitamin D  supplement such as 1000 units over-the-counter daily 3-continue the current medications 4-reminder that amitriptyline , valsartan  and Coumadin  were discontinued this hospitalization 5-check weights and blood pressures as we discussed in return in 1 week with those numbers for me to review as well as to come in for a lab 6-I recommend referral to physical therapy either at home or outpatient for falls, balance, strength.  Since he cannot drive we could probably get home health therapy to come out.  See if agreeable to one of the other 7-follow-up with cardiology as planned 8-his platelets are low but stable, presumably chronic low, related to liver disease.  I can either refer to hematology specialist now for consult or we can just continue to monitor the platelets for now as long as they stay stable 9-if blood pressures are running low such as less than 115/65 then we will likely back off some of the medication 10-hold off on the Farxiga  for the next 4 days to give his body a chance to recuperate from the recent fluid shifts in the hospital 11-wear compression socks or compression hose if you have them given the ankle and foot swelling.  Use some leg elevation as well

## 2023-10-30 ENCOUNTER — Ambulatory Visit (HOSPITAL_COMMUNITY)
Admission: RE | Admit: 2023-10-30 | Discharge: 2023-10-30 | Disposition: A | Discharge status: Home / Self Care | Source: Ambulatory Visit | Attending: Cardiology | Admitting: Cardiology

## 2023-10-30 ENCOUNTER — Encounter (HOSPITAL_COMMUNITY): Payer: Self-pay

## 2023-10-30 VITALS — BP 106/74 | HR 66 | Ht 72.0 in | Wt 152.4 lb

## 2023-10-30 DIAGNOSIS — I5082 Biventricular heart failure: Secondary | ICD-10-CM | POA: Insufficient documentation

## 2023-10-30 DIAGNOSIS — I255 Ischemic cardiomyopathy: Secondary | ICD-10-CM | POA: Diagnosis not present

## 2023-10-30 DIAGNOSIS — T45515A Adverse effect of anticoagulants, initial encounter: Secondary | ICD-10-CM | POA: Diagnosis not present

## 2023-10-30 DIAGNOSIS — Z951 Presence of aortocoronary bypass graft: Secondary | ICD-10-CM | POA: Diagnosis not present

## 2023-10-30 DIAGNOSIS — R7401 Elevation of levels of liver transaminase levels: Secondary | ICD-10-CM

## 2023-10-30 DIAGNOSIS — K648 Other hemorrhoids: Secondary | ICD-10-CM | POA: Insufficient documentation

## 2023-10-30 DIAGNOSIS — I081 Rheumatic disorders of both mitral and tricuspid valves: Secondary | ICD-10-CM | POA: Diagnosis not present

## 2023-10-30 DIAGNOSIS — I4821 Permanent atrial fibrillation: Secondary | ICD-10-CM | POA: Diagnosis not present

## 2023-10-30 DIAGNOSIS — Z6820 Body mass index (BMI) 20.0-20.9, adult: Secondary | ICD-10-CM | POA: Insufficient documentation

## 2023-10-30 DIAGNOSIS — R64 Cachexia: Secondary | ICD-10-CM

## 2023-10-30 DIAGNOSIS — Z7901 Long term (current) use of anticoagulants: Secondary | ICD-10-CM | POA: Diagnosis not present

## 2023-10-30 DIAGNOSIS — Z950 Presence of cardiac pacemaker: Secondary | ICD-10-CM | POA: Insufficient documentation

## 2023-10-30 DIAGNOSIS — I5022 Chronic systolic (congestive) heart failure: Secondary | ICD-10-CM

## 2023-10-30 DIAGNOSIS — N1832 Chronic kidney disease, stage 3b: Secondary | ICD-10-CM | POA: Diagnosis not present

## 2023-10-30 DIAGNOSIS — Z7984 Long term (current) use of oral hypoglycemic drugs: Secondary | ICD-10-CM | POA: Insufficient documentation

## 2023-10-30 DIAGNOSIS — K625 Hemorrhage of anus and rectum: Secondary | ICD-10-CM | POA: Diagnosis not present

## 2023-10-30 DIAGNOSIS — Z66 Do not resuscitate: Secondary | ICD-10-CM | POA: Diagnosis not present

## 2023-10-30 DIAGNOSIS — I2510 Atherosclerotic heart disease of native coronary artery without angina pectoris: Secondary | ICD-10-CM | POA: Diagnosis not present

## 2023-10-30 NOTE — Telephone Encounter (Signed)
 Please call wife     After reviewing back of the chart I have the following recommendations:   1- I recommend a referral to palliative care which can help with long-term chronic disease and see if there is other things they can with to assist in his care currently.  If agreeable we will put this referral in 2- his vitamin D  was low recently so I recommend he be on vitamin D  supplement such as 1000 units over-the-counter daily 3-continue the current medications 4-reminder that amitriptyline , valsartan  and Coumadin  were discontinued this hospitalization 5-check weights and blood pressures as we discussed in return in 1 week with those numbers for me to review as well as to come in for a lab 6-I recommend referral to physical therapy either at home or outpatient for falls, balance, strength.  Since he cannot drive we could probably get home health therapy to come out.  See if agreeable to one of the other 7-follow-up with cardiology as planned 8-his platelets are low but stable, presumably chronic low, related to liver disease.  I can either refer to hematology specialist now for consult or we can just continue to monitor the platelets for now as long as they stay stable 9-if blood pressures are running low such as less than 115/65 then we will likely back off some of the medication 10-hold off on the Farxiga  for the next 4 days to give his body a chance to recuperate from the recent fluid shifts in the hospital 11-wear compression socks or compression hose if you have them given the ankle and foot swelling.  Use some leg elevation as well

## 2023-10-30 NOTE — Telephone Encounter (Signed)
 Spoke to wife and she is at cardiologist and was told to stop farixga for 4 day and lasix  for 3 days.

## 2023-10-30 NOTE — Patient Instructions (Signed)
 Medication Changes:  CONTINUE LASIX  (FUROSEMIDE ) 40MG  DAILY   FOR THE NEXT 3 DAYS TAKE IT TWICE DAILY   CONTINUE FARXIGA    Referrals:  YOU HAVE BEEN REFERRED TO PARAMEDICINE THEY WILL REACH OUT TO YOU OR CALL TO ARRANGE THIS. PLEASE CALL US  WITH ANY CONCERNS   Follow-Up in: 2 WEEKS WITH APP AS SCHEDULED   At the [facility], you and your health needs are our priority. We have a designated team specialized in the treatment of Heart Failure. This Care Team includes your primary Heart Failure Specialized Cardiologist (physician), Advanced Practice Providers (APPs- Physician Assistants and Nurse Practitioners), and Pharmacist who all work together to provide you with the care you need, when you need it.   You may see any of the following providers on your designated Care Team at your next follow up:  Dr. Toribio Fuel Dr. Ezra Shuck Dr. Ria Commander Dr. Odis Brownie Greig Mosses, NP Caffie Shed, [place] [facility] [place], [place] Beckey Coe, NP [place] Lee, NP Tinnie Redman, PharmD   Please be sure to bring in all your medications bottles to every appointment.   Need to Contact Us :  If you have any questions or concerns before your next appointment please send us  a message through Coco or call our office at [phone].    TO LEAVE A MESSAGE FOR THE NURSE SELECT OPTION 2, PLEASE LEAVE A MESSAGE INCLUDING: YOUR NAME DATE OF BIRTH CALL BACK NUMBER REASON FOR CALL**this is important as we prioritize the call backs  YOU WILL RECEIVE A CALL BACK THE SAME DAY AS LONG AS YOU CALL BEFORE 4:00 PM

## 2023-10-30 NOTE — Telephone Encounter (Signed)
 Spoke to wife and she does not want to do Palliative care or Hematology at this time. She is agreeable to PT for home health and other recommendations.   I have placed order for [facility]

## 2023-10-30 NOTE — Progress Notes (Signed)
 ReDS Vest / Clip - 10/30/23 1500       ReDS Vest / Clip   Station Marker C    Ruler Value 26    ReDS Value Range Low volume    ReDS Actual Value 31

## 2023-10-31 ENCOUNTER — Telehealth (HOSPITAL_COMMUNITY): Payer: Self-pay

## 2023-10-31 ENCOUNTER — Other Ambulatory Visit (HOSPITAL_COMMUNITY): Payer: Self-pay | Admitting: Cardiology

## 2023-10-31 ENCOUNTER — Telehealth (HOSPITAL_COMMUNITY): Payer: Self-pay | Admitting: Cardiology

## 2023-10-31 DIAGNOSIS — I5082 Biventricular heart failure: Secondary | ICD-10-CM

## 2023-10-31 NOTE — Telephone Encounter (Signed)
 Got back in touch with daughter, Graham, discussed that while patient is end stage heart failure, he is not imminently dying.  She would like me to put her mother in contact with hospice companies so that she can have one picked out. Also asking for comfort medications to be ordered, I dicussed that this would be at hospice/PCP discretion, but I will ultimately talk with patient's wife about goals of care prior to hospice referral. Discussed that Palliative Care might be the better option with their current goals of care, but that I would call and discuss with her mother.   Spoke with [name], patient's wife, over the phone. Relayed discussion with daughter. Patient is end-stage heart failure, however we can attempt to optimize further and see if we can get him feeling better for what time he has. Again, biggest concern of mine is frailty and nutrition, but remains independently ambulatory with cane around house and appetite has been improving. Patient is not ready for hospice care and does not want DME such as hospital bed in the home and is ambulatory. He and his wife also do not want to stop active medical treatment, as liver congestion is improving and renal function is okay at this time. If he starts to medically decline, would be open to hospice at home. Will put in referral for outpatient palliative care services. Appreciate their assistance.

## 2023-10-31 NOTE — Telephone Encounter (Signed)
 Pt was referred to paramedicine. I spoke to him today and he agreed for home visit next Wednesday.  Monday he has appoints and Tuesday he did not want so Wednesday he was.  He said his wife would be there also.    Izetta Quivers, EMT-Paramedic  [phone] 10/31/2023

## 2023-10-31 NOTE — Telephone Encounter (Signed)
 Called and spoke to daughter, Graham, at the request of patient and his spouse. Dicussed at length the state of her father's condition. That he is end-stage heart failure, recovering from recent acute decompensation. Our goals in clinic and for patient are to attempt to optimize his volume and get him feeling better at home, as well as support him and his wife as needed. She is concerned that he is at imminent end of life and that he should be on hospice, estimating that he will not make it 30 more days. Explained that while he is in end-stage heart failure, his congestion is improving and renal function is stable. My biggest concern for patient is failure to thrive, as his appetite has not fully returned and his function status has declined, as well as intermittent delirium/confusion. Pt and wife have reported yesterday that this has been slowly improving. He does not want home physical therapy at this time, refusing services. He and his wife have been offered palliative care services and have declined. His wife feels that she is able to provide comfort and care to patient at this point. Daughter requesting full hospice care services and DME in home (hospital bed, bedside commode, comfort medication). We dicussed DME in the home yesterday in clinic and patient was adamant, as well as agitated about this notion. I will touch base with patient and his wife today to discuss their needs/wants further.   [place] Lysander Calixte, NP 10/31/23  [facility]

## 2023-10-31 NOTE — Telephone Encounter (Signed)
 Wife is following up very concerned and worried. Wife says she is aware and has accepted that patient is dying. She requests that the call be returned to her daughter, Graham, in [place] at [phone].

## 2023-11-02 ENCOUNTER — Other Ambulatory Visit (HOSPITAL_COMMUNITY): Payer: Self-pay

## 2023-11-04 ENCOUNTER — Other Ambulatory Visit (HOSPITAL_COMMUNITY): Payer: Self-pay

## 2023-11-04 ENCOUNTER — Ambulatory Visit: Attending: Student | Admitting: Student

## 2023-11-04 ENCOUNTER — Other Ambulatory Visit: Payer: Self-pay

## 2023-11-04 ENCOUNTER — Other Ambulatory Visit

## 2023-11-04 ENCOUNTER — Encounter: Payer: Self-pay | Admitting: Student

## 2023-11-04 VITALS — BP 110/58 | HR 64 | Ht 72.0 in | Wt 141.8 lb

## 2023-11-04 DIAGNOSIS — I5022 Chronic systolic (congestive) heart failure: Secondary | ICD-10-CM

## 2023-11-04 DIAGNOSIS — I5082 Biventricular heart failure: Secondary | ICD-10-CM | POA: Diagnosis not present

## 2023-11-04 DIAGNOSIS — I4821 Permanent atrial fibrillation: Secondary | ICD-10-CM

## 2023-11-04 DIAGNOSIS — I495 Sick sinus syndrome: Secondary | ICD-10-CM | POA: Diagnosis not present

## 2023-11-04 LAB — CUP PACEART INCLINIC DEVICE CHECK
Date Time Interrogation Session: 20250915125639
Implantable Lead Connection Status: 753985
Implantable Lead Implant Date: 20250211
Implantable Lead Location: 753862
Implantable Lead Model: 377
Implantable Lead Serial Number: 8001791902
Implantable Pulse Generator Implant Date: 20250211
Pulse Gen Serial Number: 1000357520

## 2023-11-04 NOTE — Patient Instructions (Signed)
 Medication Instructions:  Your physician recommends that you continue on your current medications as directed. Please refer to the Current Medication list given to you today.  *If you need a refill on your cardiac medications before your next appointment, please call your pharmacy*  Lab Work: None ordered If you have labs (blood work) drawn today and your tests are completely normal, you will receive your results only by: MyChart Message (if you have MyChart) OR A paper copy in the mail If you have any lab test that is abnormal or we need to change your treatment, we will call you to review the results.  Follow-Up: At [facility], you and your health needs are our priority.  As part of our continuing mission to provide you with exceptional heart care, our providers are all part of one team.  This team includes your primary Cardiologist (physician) and Advanced Practice Providers or APPs (Physician Assistants and Nurse Practitioners) who all work together to provide you with the care you need, when you need it.  Your next appointment:   3 month(s)  Provider:   Ozell Jodie Passey, PA-C

## 2023-11-04 NOTE — Progress Notes (Signed)
 Electrophysiology Office Note:   ID:  Jason Day, DOB 07/20/1940, MRN [id number]  Primary Cardiologist: Maude Emmer, MD Electrophysiologist: OLE ONEIDA HOLTS, MD      History of Present Illness:   Jason Day is a 83 y.o. male with h/o permanent AF on coumadin , CAD s/p CABG, ICM, and CHB seen today for post hospital follow up.    Admitted 8/25 with rectal bleeding. GI consulted and felt 2/2 hemorrhoids. Hgb 12.7. INR supratherapeutic on admit at 9.0. Coumadin  held. Labs showed significant transaminitis with AST and ALT greater than 1000. INR reversed w/ Vit K. Also w/ AKI, SCr 2.68 on admit (baseline ~1.6). Echo showed marked drop in LVEF since previous study 6 months ago, EF now 20-25% w/ global HK, mod concentric LVH. 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 AHF consulted, diuresed with IV lasix  and GDMT titrated. Drop in EF felt to be multifactorial in setting of chronic RV pacing, ischemic CMP and aneurysmal LV. Seen by EP, not a candidate for transition to BiV pacemaker given overall picture per Dr. HOLTS. Started on Eliquis  2.5 bid for [facility]. He was discharged home, weight 156 lbs.   Since discharge from hospital the patient reports doing gradually better. Palliative care has been arranged, and are scheduled to come out to the house starting this week. Pt denies undue SOB. He has had some lethargy and poor appetite. Confusion seems to have overall improved.   Review of systems complete and found to be negative unless listed in HPI.   EP Information / Studies Reviewed:    EKG is not ordered today. EKG from 10/18/2023 reviewed which showed V paced rhythm at 92 bpm.        PPM Interrogation-  reviewed in detail today,  See PACEART report.  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 / permanent AF   Physical Exam:   VS:  BP (!) 110/58   Pulse 64   Ht 6' (1.829 m)   Wt 141 lb 12.8 oz  (64.3 kg)   SpO2 97%   BMI 19.23 kg/m    Wt Readings from Last 3 Encounters:  11/04/23 141 lb 12.8 oz (64.3 kg)  10/30/23 152 lb 6.4 oz (69.1 kg)  10/28/23 158 lb (71.7 kg)     GEN: No acute distress  NECK: No JVD; No carotid bruits CARDIAC: Irregularly irregular rate and rhythm, no murmurs, rubs, gallops RESPIRATORY:  Clear to auscultation without rales, wheezing or rhonchi  ABDOMEN: Soft, non-tender, non-distended EXTREMITIES:  2-3+ edema; No deformity   ASSESSMENT AND PLAN:    CHB s/p Biotronik PPM  Normal PPM function See Pace Art report No changes today  Permanent AF INR was supra therapeutic during recent admission; multifactorial. Coumadin  has been discontinued Continue eliquis  2.5 mg BID  Chronic systolic CHF End stage RV pacing Valvular heart disease Suspect much of his wide QRS is due to ischemic CMP and aneurysmal LV as demonstrated on echocardiogram.  He is currently a poor candidate for invasive EP procedures.   If his course and trajectory were to improve, it can be considered further at a later date.  For now, pt and family wish to avoid invasive procedures where possible.   Cachexia Multifactorial Chronic diarrhea Dementia Chronic systolic CHF   Disposition:   Follow up with EP APP in 3 months. Can further assess course and appropriateness of device upgrade consideration at that visit.   Signed,  Ozell Prentice Passey, PA-C

## 2023-11-06 NOTE — Progress Notes (Signed)
 Remote pacemaker transmission.

## 2023-11-07 ENCOUNTER — Other Ambulatory Visit (HOSPITAL_COMMUNITY): Payer: Self-pay

## 2023-11-07 ENCOUNTER — Ambulatory Visit: Payer: Self-pay | Admitting: Cardiology

## 2023-11-07 NOTE — Progress Notes (Signed)
 Paramedicine Encounter    Patient ID: Jason Day, male    DOB: 01-26-1941, 83 y.o.   MRN: [id number]   Complaints-tired easily   Edema-tiny amount-wife reports much better than before-she is getting the compression stockings to put on him  Compliance with meds-yes   Pill box filled-no-wife manages If so, by whom-n/a   Refills needed-none   SOCIAL/MEDICAL BARRIERS:  PHARMACY USED -[facility]    MED ISSUES:  AFFORDABILITY-[facility]   PT ASSIST APPS NEEDED-no   PCP-[facility]    INSURANCE-yes  SOURCE OF INCOME-yes  TRANSPORTATION-wife   FOOD INSECURITIES/NEEDS-no  FOOD STAMPS-n/a  REVIEWED DIET/FLUID/SALT RESTRICTIONS-yes   RENT/OWN HOME ISSUES-no   SOCIAL SUPPORT-yes  SAFETY/DOMESTIC ISSUES-no  SUBSTANCE ABUSE-no  DAILY WEIGHTS-he wasn't been weighting, explained the why behind this and he is agreeable to try to weigh daily.   EDUCATE ON DISEASE PROCESS/SYMPTOMS/PURPOSES OF MEDS-limited appetite   Here for the first visit of referral.  Lives with wife.  Daughter lives in [place].  Son is estranged-not spoken to him in 5 years She does not want him knowing that he is end stage and death is near. Not sure what expectancy he has. Not sure who told wife he was very close to death, she said she was told 31-Jan-2024??  He does not want palliative or hospice involved at this time. Wife reports he can still do his ADL's without assistance.  He denies increased sob, he likes to lay flat but with a couple pillows for comfort.   He has limited appetite, he has ensures he drinks. He also eats soup for supper but she gets the low sodium.   He does not want PT or any further services at this time.     She manages his meds.  Meds verified and everything is correct.  Will f/u at his appoint next week.   She had DNR filled out and had the most form but it was not filled out or signed, she will bring to appoint next week.  I tried to explain the differences  between the 2 forms, but she seems quite overwhelmed right now.   BP 96/60   Pulse 68   Resp 15   Wt 143 lb (64.9 kg)   SpO2 93%   BMI 19.39 kg/m  90/56 on rt side  Weight yesterday-? Last visit weight-152  Patient Care Team: Tysinger, Alm RAMAN, PA-C as PCP - General (Family Medicine) Cindie Ole DASEN, MD as PCP - Electrophysiology (Cardiology) Delford Maude BROCKS, MD as PCP - Cardiology (Cardiology) Delford Maude BROCKS, MD as Consulting Physician (Cardiology) Melita Drivers, MD as Consulting Physician (Orthopedic Surgery) Watt Norleen, MD as Attending Physician (Urology) Rolan Ezra RAMAN, MD as Consulting Physician (Cardiology) Stacia Glendia BRAVO, MD as Consulting Physician (Gastroenterology) Lorin Norris, MD as Consulting Physician (Allergy)  Patient Active Problem List   Diagnosis Date Noted   Risk for falls 10/28/2023   DNR (do not resuscitate) 10/28/2023   History of liver disease 10/28/2023   Transaminitis 10/17/2023   Rectal bleeding 10/14/2023   Acute renal failure superimposed on stage 3a chronic kidney disease (HCC) 10/14/2023   Appetite impaired 09/26/2023   Lactose intolerance 09/26/2023   Bowel incontinence 03/30/2023   Supratherapeutic INR 03/29/2023   Chronic heart failure with mildly reduced ejection fraction (HFmrEF, 41-49%) (HCC) 03/29/2023   Acute on chronic systolic (congestive) heart failure (HCC) 03/29/2023   CKD stage 3a, GFR 45-59 ml/min (HCC) 03/29/2023   Skin lesion 11/06/2022   Marital conflict 11/17/2021  Memory impairment 12/30/2020   Coagulation defect (HCC) 12/30/2020   Incontinence of feces 12/30/2020   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   Chronic diarrhea 12/29/2020   Weight loss 12/29/2020   Depression, major, single episode, mild (HCC) 12/29/2020   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   Mitral regurgitation 12/25/2018   S/P reverse total shoulder arthroplasty, 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   Change in behavior 08/28/2017   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    Elevated PSA 04/23/2017   Serum total bilirubin elevated 04/22/2017   Hemorrhoids 04/22/2017   Permanent atrial fibrillation (HCC)    Chronic combined systolic and diastolic heart failure (HCC)    Arteriosclerosis of coronary artery bypass graft    Hyperlipidemia    Elevated troponin 10/16/2016   Coronary artery disease     Current Outpatient Medications:    apixaban  (ELIQUIS ) 2.5 MG TABS tablet, Take 1 tablet (2.5 mg total) by mouth 2 (two) times daily., Disp: 60 tablet, Rfl: 2   cholecalciferol (VITAMIN D3) 25 MCG (1000 UNIT) tablet, Take 5,000 Units by mouth daily., Disp: , Rfl:    dapagliflozin  propanediol (FARXIGA ) 10 MG TABS tablet, Take 1 tablet (10 mg total) by mouth daily before breakfast., Disp: 90 tablet, Rfl: 3   desvenlafaxine  (PRISTIQ ) 50 MG 24 hr tablet, Take 1 tablet (50 mg total) by mouth daily., Disp: 30 tablet, Rfl: 1   furosemide  (LASIX ) 40 MG tablet, Take 1 tablet (40 mg total) by mouth daily., Disp: 90 tablet, Rfl: 0   metoprolol  succinate (TOPROL -XL) 25 MG 24 hr tablet, Take 0.5 tablets (12.5 mg total) by mouth daily. 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  Tobacco Use   Smoking status: Former    Current packs/day: 0.00    Types: Cigarettes    Quit date: 1970    Years since quitting: 55.7    Passive exposure: Never   Smokeless tobacco: Never   Tobacco comments:    Quit in 1970  Vaping Use   Vaping status: Never Used  Substance and Sexual Activity   Alcohol  use: No   Drug use: No   Sexual activity: Yes  Other Topics Concern   Not on file   Social History Narrative   Married    Originally from [place]  moved to [place]  age 52 moved to [facility] [place]   1 son Adeel [place]iff[place] in [place]   1dtr [place]       Has living will   Wife is health care POA--then son   Would accept resuscitation   No tube feeds if cognitively unaware   Social Drivers of Health   Financial Resource Strain: Low Risk  (12/04/2022)   Overall Financial Resource Strain (CARDIA)    Difficulty of Paying Living Expenses: Not hard at all  Food Insecurity: No Food Insecurity (10/15/2023)   Hunger Vital Sign    Worried About Running Out of Food in the Last Year: Never true    Ran Out of Food in the Last Year: Never true  Transportation Needs: No Transportation Needs (10/15/2023)  PRAPARE - Administrator, Civil Service (Medical): No    Lack of Transportation (Non-Medical): No  Physical Activity: Insufficiently Active (12/04/2022)   Exercise Vital Sign    Days of Exercise per Week: 3 days    Minutes of Exercise per Session: 30 min  Stress: No Stress Concern Present (12/04/2022)   [employer] of Occupational Health - Occupational Stress Questionnaire    Feeling of Stress : Not at all  Social Connections: Moderately Isolated (10/15/2023)   Social Connection and Isolation Panel    Frequency of Communication with Friends and Family: Never    Frequency of Social Gatherings with Friends and Family: Twice a week    Attends Religious Services: More than 4 times per year    Active Member of [employer] or Organizations: No    Attends Banker Meetings: Never    Marital Status: Married  Catering manager Violence: Not At Risk (10/15/2023)   Humiliation, Afraid, Rape, and Kick questionnaire    Fear of Current or Ex-Partner: No    Emotionally Abused: No    Physically Abused: No    Sexually Abused: No    Physical Exam      Future Appointments  Date Time Provider Department Center  11/13/2023  8:15 AM [facility] HVT  DEVICE REMOTES [facility] H&V  11/13/2023 11:30 AM [facility] PA/NP [facility] None  12/26/2023  2:50 PM PFM-ANNUAL WELLNESS VISIT [facility] [id number]  01/28/2024 11:20 AM Lesia Ozell Barter, PA-C [facility]&V  02/13/2024  7:00 AM [facility] DEVICE REMOTES [facility] H&V  03/05/2024  9:30 AM [facility] PA/NP [facility] None  05/13/2024  8:15 AM CVD [facility] DEVICE REMOTES [facility] H&V  08/12/2024  8:15 AM [facility] DEVICE REMOTES [facility]&V  11/11/2024  8:15 AM CVD [facility]VT DEVICE REMOTES [facility]&V       Izetta Quivers, Paramedic [phone] [facility] Paramedic  11/07/23

## 2023-11-11 ENCOUNTER — Telehealth (HOSPITAL_COMMUNITY): Payer: Self-pay

## 2023-11-11 NOTE — Telephone Encounter (Signed)
 Pts wife reached out to me on Saturday to let me know his weight was 141, it was 143 previously, and his b/p was 98/78 per her home b/p kit.  She reported he was irritable towards her, but eating ok, but she was just calling to let us  know he had lost some weight.  B/p was 96/60 at our visit.  He is being seen in clinic on Wednesday this week.  Will f/u with them there.   Izetta Quivers, EMT-Paramedic  [phone] 11/11/2023

## 2023-11-12 ENCOUNTER — Telehealth (HOSPITAL_COMMUNITY): Payer: Self-pay

## 2023-11-12 NOTE — Telephone Encounter (Signed)
 Called to confirm/remind patient of their appointment at the [facility] on 11/13/23.   Appointment:   [] Confirmed  [x] Left mess   [] No answer/No voice mail  [] VM Full/unable to leave message  [] Phone not in service  And to bring in all medications and/or complete list.

## 2023-11-13 ENCOUNTER — Other Ambulatory Visit (HOSPITAL_COMMUNITY): Payer: Self-pay

## 2023-11-13 ENCOUNTER — Encounter (HOSPITAL_COMMUNITY): Payer: Self-pay

## 2023-11-13 ENCOUNTER — Ambulatory Visit (HOSPITAL_COMMUNITY)
Admission: RE | Admit: 2023-11-13 | Discharge: 2023-11-13 | Disposition: A | Discharge status: Home / Self Care | Source: Ambulatory Visit | Attending: Family Medicine | Admitting: Family Medicine

## 2023-11-13 ENCOUNTER — Other Ambulatory Visit: Payer: Self-pay

## 2023-11-13 ENCOUNTER — Telehealth (HOSPITAL_COMMUNITY): Payer: Self-pay

## 2023-11-13 ENCOUNTER — Ambulatory Visit (HOSPITAL_COMMUNITY): Payer: Self-pay | Admitting: Family Medicine

## 2023-11-13 ENCOUNTER — Ambulatory Visit (INDEPENDENT_AMBULATORY_CARE_PROVIDER_SITE_OTHER)

## 2023-11-13 VITALS — BP 116/74 | HR 77 | Ht 72.0 in | Wt 146.0 lb

## 2023-11-13 DIAGNOSIS — K589 Irritable bowel syndrome without diarrhea: Secondary | ICD-10-CM | POA: Diagnosis not present

## 2023-11-13 DIAGNOSIS — Z66 Do not resuscitate: Secondary | ICD-10-CM | POA: Insufficient documentation

## 2023-11-13 DIAGNOSIS — I2510 Atherosclerotic heart disease of native coronary artery without angina pectoris: Secondary | ICD-10-CM | POA: Insufficient documentation

## 2023-11-13 DIAGNOSIS — N1832 Chronic kidney disease, stage 3b: Secondary | ICD-10-CM | POA: Insufficient documentation

## 2023-11-13 DIAGNOSIS — K7689 Other specified diseases of liver: Secondary | ICD-10-CM | POA: Diagnosis not present

## 2023-11-13 DIAGNOSIS — T45515A Adverse effect of anticoagulants, initial encounter: Secondary | ICD-10-CM | POA: Insufficient documentation

## 2023-11-13 DIAGNOSIS — R627 Adult failure to thrive: Secondary | ICD-10-CM | POA: Insufficient documentation

## 2023-11-13 DIAGNOSIS — Z8719 Personal history of other diseases of the digestive system: Secondary | ICD-10-CM | POA: Insufficient documentation

## 2023-11-13 DIAGNOSIS — R188 Other ascites: Secondary | ICD-10-CM | POA: Diagnosis not present

## 2023-11-13 DIAGNOSIS — R64 Cachexia: Secondary | ICD-10-CM | POA: Diagnosis not present

## 2023-11-13 DIAGNOSIS — I5023 Acute on chronic systolic (congestive) heart failure: Secondary | ICD-10-CM | POA: Diagnosis not present

## 2023-11-13 DIAGNOSIS — Z7189 Other specified counseling: Secondary | ICD-10-CM | POA: Diagnosis not present

## 2023-11-13 DIAGNOSIS — I340 Nonrheumatic mitral (valve) insufficiency: Secondary | ICD-10-CM | POA: Diagnosis not present

## 2023-11-13 DIAGNOSIS — Z950 Presence of cardiac pacemaker: Secondary | ICD-10-CM | POA: Insufficient documentation

## 2023-11-13 DIAGNOSIS — I255 Ischemic cardiomyopathy: Secondary | ICD-10-CM

## 2023-11-13 DIAGNOSIS — R7401 Elevation of levels of liver transaminase levels: Secondary | ICD-10-CM | POA: Insufficient documentation

## 2023-11-13 DIAGNOSIS — Z681 Body mass index (BMI) 19 or less, adult: Secondary | ICD-10-CM | POA: Insufficient documentation

## 2023-11-13 DIAGNOSIS — I5082 Biventricular heart failure: Secondary | ICD-10-CM | POA: Diagnosis not present

## 2023-11-13 DIAGNOSIS — R791 Abnormal coagulation profile: Secondary | ICD-10-CM | POA: Diagnosis not present

## 2023-11-13 DIAGNOSIS — F03A0 Unspecified dementia, mild, without behavioral disturbance, psychotic disturbance, mood disturbance, and anxiety: Secondary | ICD-10-CM | POA: Insufficient documentation

## 2023-11-13 DIAGNOSIS — N183 Chronic kidney disease, stage 3 unspecified: Secondary | ICD-10-CM

## 2023-11-13 DIAGNOSIS — I38 Endocarditis, valve unspecified: Secondary | ICD-10-CM | POA: Diagnosis not present

## 2023-11-13 DIAGNOSIS — I4821 Permanent atrial fibrillation: Secondary | ICD-10-CM | POA: Diagnosis not present

## 2023-11-13 DIAGNOSIS — Z79899 Other long term (current) drug therapy: Secondary | ICD-10-CM | POA: Diagnosis not present

## 2023-11-13 DIAGNOSIS — I361 Nonrheumatic tricuspid (valve) insufficiency: Secondary | ICD-10-CM | POA: Diagnosis not present

## 2023-11-13 DIAGNOSIS — Z7901 Long term (current) use of anticoagulants: Secondary | ICD-10-CM | POA: Insufficient documentation

## 2023-11-13 DIAGNOSIS — Z951 Presence of aortocoronary bypass graft: Secondary | ICD-10-CM | POA: Diagnosis not present

## 2023-11-13 DIAGNOSIS — Z87891 Personal history of nicotine dependence: Secondary | ICD-10-CM | POA: Diagnosis not present

## 2023-11-13 LAB — COMPREHENSIVE METABOLIC PANEL WITH GFR
ALT: 16 U/L (ref 0–44)
AST: 26 U/L (ref 15–41)
Albumin: 3.3 g/dL — ABNORMAL LOW (ref 3.5–5.0)
Alkaline Phosphatase: 93 U/L (ref 38–126)
Anion gap: 11 (ref 5–15)
BUN: 22 mg/dL (ref 8–23)
CO2: 33 mmol/L — ABNORMAL HIGH (ref 22–32)
Calcium: 8.4 mg/dL — ABNORMAL LOW (ref 8.9–10.3)
Chloride: 93 mmol/L — ABNORMAL LOW (ref 98–111)
Creatinine, Ser: 1.44 mg/dL — ABNORMAL HIGH (ref 0.61–1.24)
GFR, Estimated: 49 mL/min — ABNORMAL LOW (ref >60)
Glucose, Bld: 97 mg/dL (ref 70–99)
Potassium: 3.6 mmol/L (ref 3.5–5.1)
Sodium: 137 mmol/L (ref 135–145)
Total Bilirubin: 3.3 mg/dL — ABNORMAL HIGH (ref 0.0–1.2)
Total Protein: 6.1 g/dL — ABNORMAL LOW (ref 6.5–8.1)

## 2023-11-13 LAB — BRAIN NATRIURETIC PEPTIDE: B Natriuretic Peptide: 1049.1 pg/mL — ABNORMAL HIGH (ref 0.0–100.0)

## 2023-11-13 MED ORDER — FUROSCIX 80 MG/10ML ~~LOC~~ CTKT
80.0000 mg | CARTRIDGE | Freq: Every day | SUBCUTANEOUS | 0 refills | Status: DC | PRN
  Filled 2023-11-13: qty 10, 10d supply, fill #0

## 2023-11-13 MED ORDER — POTASSIUM CHLORIDE CRYS ER 20 MEQ PO TBCR
40.0000 meq | EXTENDED_RELEASE_TABLET | Freq: Every day | ORAL | 0 refills | Status: DC
  Filled 2023-11-13: qty 6, 3d supply, fill #0

## 2023-11-13 NOTE — Progress Notes (Signed)
 Paramedicine Encounter   Patient ID: Jason Day , male,   DOB: 1940-09-19,82 y.o.,  MRN: [id number]   Met patient in clinic today with provider.  Weight @ clinic-146 B/P-116/74 P-77 [id number] Reds clip-40%  Seen harlene today and she reports he has about 10lbs of fluid on him.   Med changes- Furoscix  for 3 days starting tomor.  And potassium 40meq for 3 days Sunday he is to resume normal dosing.   After 3 days resume 40mg  of lasix  daily.  Needs to be seen 7-10 days.   Will need to see him on Monday.   Izetta Quivers, EMT-Paramedic [phone] 11/13/2023

## 2023-11-13 NOTE — Progress Notes (Signed)
 Medication Samples have been provided to the patient.  Drug name: Furoscix        Strength: 80mg         Qty: 2 samples  LOT: [id number]  Exp.Date: 01/18/2025  Dosing instructions: as directed by HF clinic  The patient has been instructed regarding the correct time, dose, and frequency of taking this medication, including desired effects and most common side effects.   Gaynor Ferreras B Moriyah Byington 12:18 PM 11/13/2023

## 2023-11-13 NOTE — Telephone Encounter (Addendum)
 Advanced [facility] Failure Patient Advocate Encounter  Prior authorization for Furoscix  has been submitted and approved. Test billing returns $0 for 100 day supply.  Key: [id number] Effective: 11/13/2023 to 02/19/2024  Rachel DEL, CPhT Rx Patient Advocate Phone: [phone]

## 2023-11-13 NOTE — Patient Instructions (Signed)
 Medication Changes:  Your provider has order Furoscix  for you. This is an on-body infuser that gives you a dose of Furosemide .   It will be shipped to your home from [facility], they will call you before shipping  Ensure you write down the time you start your infusion so that if there is a problem you will know how long the infusion lasted  Use Furoscix  only AS DIRECTED by our office  Dosing Directions:   Day 1= DO NOT TAKE FUROSEMIDE --- TAKE FUROSCIX  80MG  (1) KIT ON THURSDAY WITH OF POTASSIUM   Day 2= DO NOT TAKE FUROSEMIDE ----TAKE FUROSCIX  80MG  (1) KIT ON FRIDAY WITH OF POTASSIUM   Day 3= DO NOT TAKE FUROSEMIDE ---TAKE FUROSCIX  80MG  (1) KIT ON SATURDAY WITH OF POTASSIUM   DAY 4= ON SUNDAY START BACK FUROSEMIDE  (LASIX ) 40MG  ONCE DAILY    Lab Work:  Labs done today, your results will be available in MyChart, we will contact you for abnormal readings.  Follow-Up in: 1 WEEK AS SCHEDULED WITH APP CLINIC   At the [facility], you and your health needs are our priority. We have a designated team specialized in the treatment of Heart Failure. This Care Team includes your primary Heart Failure Specialized Cardiologist (physician), Advanced Practice Providers (APPs- Physician Assistants and Nurse Practitioners), and Pharmacist who all work together to provide you with the care you need, when you need it.   You may see any of the following providers on your designated Care Team at your next follow up:  Dr. Toribio Fuel Dr. Ezra Shuck Dr. Ria Commander Dr. Odis Brownie Greig Mosses, NP Caffie Shed, [place] [facility] [place], [place] Beckey Coe, NP [place] Lee, NP Tinnie Redman, PharmD   Please be sure to bring in all your medications bottles to every appointment.   Need to Contact Us :  If you have any questions or concerns before your next appointment please send us  a message through [place] or call our office at  [phone].    TO LEAVE A MESSAGE FOR THE NURSE SELECT OPTION 2, PLEASE LEAVE A MESSAGE INCLUDING: YOUR NAME DATE OF BIRTH CALL BACK NUMBER REASON FOR CALL**this is important as we prioritize the call backs  YOU WILL RECEIVE A CALL BACK THE SAME DAY AS LONG AS YOU CALL BEFORE 4:00 PM

## 2023-11-13 NOTE — Progress Notes (Signed)
 ADVANCED HF CLINIC NOTE  PCP: [name], PA-C Cardiology: Dr. Delford HF Cardiology: Dr. Rolan  HPI: 83 y.o. with history of CAD s/p CABG, ischemic cardiomyopathy, and permanent atrial fibrillation.  Patient had CABG x 5 in 8/18.  Post-op echo showed EF 35%. Since that time, LV EF has ranged from 25-35%. Echo in 1/24 showed EF 25-30%, moderate LV dilation, severely decreased RV systolic function, severe LAE, moderate MR. He had post-op atrial fibrillation. He failed amiodarone  and cardioversion, and it was decided not to pursue AF ablation.  He is in permanent atrial fibrillation.  In 12/23, he had a presyncopal episode.  Zio monitor was placed showing AF rate ranging 29 - 100 bpm, 46 pauses with the longest 4 seconds (at night). Also 28 NSVT runs, longest 18 beats.   Seen for initial HF visit in 2/24. Volume stable. Started on Farxiga .   He was admitted in 2/25 with bradycardia, suspected atrial fibrillation with complete heart block.  Biotronik PPM with left bundle lead was placed (he had not wanted ICD in past and now with advanced age). Echo in 2/25 showed EF 45-50%, basal inferior aneurysm, RV low normal function, severe LAE, IVC normal, moderate MR (possibly infarct-related).   Admitted 8/25 with rectal bleeding. GI consulted and felt 2/2 hemorrhoids. INR supratherapeutic on admit at 9.0. Coumadin  held. Labs showed significant transaminitis with AST and ALT greater than 1000. INR reversed w/ Vit K. Also w/ AKI, SCr 2.68 on admit (baseline ~1.6). Echo showed marked drop in LVEF since previous study 6 months ago, EF now 20-25% w/ global HK, mod concentric LVH. 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 AHF consulted, diuresed with IV lasix  and GDMT titrated. Drop in EF felt to be pacemaker-medicated cardiomyopathy with wide QRS (186 msec) despite left bundle pacing. Seen by EP, not a candidate for transition to BiV  pacemaker given overall picture per Dr. Cindie. Started on Eliquis  2.5 bid for [facility]. He was discharged home, weight 156 lbs.   Today he returns for HF follow up with his wife. Previously struggling with FTT and intermittent confusion. Now feeling better. Doing ADLs without dyspnea, though not very active at home. No SOB walking with cane on flat ground. Wife able to help him in the home. LEE improved, legs are tender. Denies palpitations, abnormal bleeding, CP, dizziness,or PND/Orthopnea. Appetite ok. Weight at home 140-143 pounds. Taking all medications. Not interested in going back to hospital or additional in-home support. He and wife have a black show Poodle.  ReDs reading: 40 %, abnormal  Labs (1/24): K 4.6, creatinine 1.15 Labs (6/25): K 4.5, creatinine 1.16 Labs (9/25): K 4.5, creatinine 1.19, LFTs down-trending  PMH:  1. CAD: S/p CABG 8/18 with LIMA-LAD, SVG-D2, SVG-OM1, seq SVG-OM2 and OM3.  2. Atrial fibrillation: Post-op CABG initially.  Failed amiodarone  and cardioversion. Decided against ablation in 2020. Now permanent.  3. Chronic systolic CHF: Ischemic cardiomyopathy.  - Echo (1/19): EF 35% - Echo (2020): EF 30-35% - Echo (8/22): EF 30-35%, moderate-severe MR - Echo (1/24): EF 25-30%, moderate LV dilation, severely decreased RV systolic function, severe LAE, moderate MR.  - Echo (2/25): EF 45-50%, basal inferior aneurysm, RV low normal function, severe LAE, IVC normal, moderate MR (possibly infarct-related).  - Echo (8/25): EF 20-25% diffuse HK with basal septal aneurysm, RV mod reduced, severe MR and TR 4. 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  Moderate-severe on 8/22 echo but moderate  on 1/24 echo.  - Echo 8/25: moderate MR 5. Bradycardia: Presyncopal episode in 12/23.  Zio monitor (1/24) with AF rate ranging 29 - 100 bpm, 46 pauses with the longest 4 seconds (at night).  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  6. NSVT: 28 NSVT  runs on 1/24 Zio monitor, longest 18 beats.  7. Mild dementia 8. 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  Married.  Quit smoking remotely.  Rare ETOH.   Family History  Problem Relation Age of Onset   COPD Father    Emphysema Father    Healthy Brother    Diabetes Neg Hx    Cancer Neg Hx    Stomach cancer Neg Hx    Colon cancer Neg Hx    Allergic rhinitis Neg Hx    Angioedema Neg Hx    Asthma Neg Hx    Eczema Neg Hx    Urticaria Neg Hx    ROS: All systems reviewed and negative except as per HPI.   Current Outpatient Medications  Medication Sig Dispense Refill   apixaban  (ELIQUIS ) 2.5 MG TABS tablet Take 1 tablet (2.5 mg total) by mouth 2 (two) times daily. 60 tablet 2   cholecalciferol (VITAMIN D3) 25 MCG (1000 UNIT) tablet Take 5,000 Units by mouth daily.     dapagliflozin  propanediol (FARXIGA ) 10 MG TABS tablet Take 1 tablet (10 mg total) by mouth daily before breakfast. 90 tablet 3   desvenlafaxine  (PRISTIQ ) 50 MG 24 hr tablet Take 1 tablet (50 mg total) by mouth daily. 30 tablet 1   furosemide  (LASIX ) 40 MG tablet Take 1 tablet (40 mg total) by mouth daily. 90 tablet 0   metoprolol  succinate (TOPROL -XL) 25 MG 24 hr tablet Take 0.5 tablets (12.5 mg total) by mouth daily. Take 12.5mg  daily 45 tablet 3   spironolactone  (ALDACTONE ) 25 MG tablet Take 0.5 tablets (12.5 mg total) by mouth daily. 90 tablet 0   No current facility-administered medications for this encounter.   Wt Readings from Last 3 Encounters:  11/13/23 66.2 kg (146 lb)  11/07/23 64.9 kg (143 lb)  11/04/23 64.3 kg (141 lb 12.8 oz)   BP 116/74   Pulse 77   Ht 6' (1.829 m)   Wt 66.2 kg (146 lb)   SpO2 97%   BMI 19.80 kg/m   Physical Exam: General:  NAD. No resp difficulty, walked into clinic with cane, elderly, thin HEENT: Normal Neck: Supple. JVP to jaw Cor: Regular rate & rhythm. No rubs, gallops, 2/6 MR Lungs: Clear Abdomen: Soft, nontender, nondistended.  Extremities: No cyanosis,  clubbing, rash, 2+ BLE edema Neuro: Alert & oriented x 3, moves all 4 extremities w/o difficulty. Affect pleasant.  Assessment/Plan: 1. Acut on Chronic Systolic Heart Failure, w/ Worsening Biventricular Failure: H/o ischemic CMP. Patient had CABG x 5 in 8/18. Post-op echo showed EF 35%.  Since that time, EF had mostly ranged from 25-30% but did improve to 45-50% on echo 2/25. s/p Biotronik PPM with LB lead 2/25 for [facility].  Echo this admit 8/25: EF 20-25% diffuse HK with basal septal aneurysm, RV mod reduced, severe MR and TR. No chest pain, doubt ACS.  Concern for pacemaker-medicated cardiomyopathy with wide QRS (186 msec) despite left bundle pacing. Seen by EP, not a candidate for transition to BiV pacemaker given overall picture per Dr. Cindie.  Improved NYHA II-early III, functional status confounded by deconditioning. He remains volume overloaded today on exam, ReDs  40%. - He does not want anymore pills. - Use Furoscix  + 40 KCL daily x 3 days (hold Lasix  while using Furoscix ). CMET and BNP today. - After 3 days, resume Lasix  40 mg daily. - Continue Farxiga  10 mg daily - Continue Toprol  XL 12.5 mg daily - Continue spiro 12.5 mg daily 2. Transaminitis: This may be due to hepatic congestion from RV failure as well as hypotension prior to admission. Small volume ascites noted on US . LFTs now trending down.  - LFTs today.  3. CKD IIIb: Baseline creatinine ~1.5.  - Continue Farxiga . BMET today. - Avoid hypotension.  4. Rectal Bleeding: Due to internal hemorrhoids + supratherapeutic INR (9). Hgb stable >13. No s/s bleeding 5. Supratherapeutic INR: Due to warfarin use + hepatic dysfunction.  - Now off warfarin, on Eliquis  2.5 mg bid (lower dosing for age, wt < 60 kg). No abnormal bleeding. 6. Atrial fibrillation: Permanent. Rate control acceptable.  - Continue Toprol  XL 12.5 daily.  - Continue Eliquis  7. CAD: h/o CABG. No CP - No ASA as will be on Eliquis .  - Consider adding back statin, as LFTs  trend back down.   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  - Treatment will be HF optimization w/ diuresis.   9. Suspect dementia: a/0 x 4 today. 10. Cachexia: Body mass index is 19.8 kg/m.  - Chronic persistent weight loss - Has Ensures   - GI follows, has IBS  11. GOC: Both he and his wife are DNR. Wife is currently able to care for him at home without assistance. Their daughter is a pediatric hospice RN, and they are familiar with resources available to them, should they need. - Long discussion with Darell and his wife about trajectory of HF. Wife has good understanding of prognosis, and Michelle is clear his goal is to stay out of the hospital.  - Continue Paramedicine support. - Will plan to see back for a couple visits until fluid stable, then have a clear plan for them how to manage fluid at home (likely with Furoscix  PRN)  Follow up in 7-10 days with APP (will need ECG, BMET and Mag).  [name] HERO [place], FNP 11/13/2023   FUROSCIX  prescribed  Patient viewed patient education video with QR code for FUROSCIX    QR code for FUROSCIX  placed on AVS  Call FUROSCIX  Direct at [phone] for questions regarding on body infuser.  Day 1 9/25 FUROSCIX  80 mg once daily  via on body infuser + KDUR 40  Day 2 9/26 FUROSCIX  80 mg once daily  via on body infuser+ KDUR 40  Day 3 9/27 FUROSCIX   80 mg once daily  via on body infuser+ KDUR 40  Hold Lasix  while using Furoscix 

## 2023-11-13 NOTE — Progress Notes (Signed)
 Specialty Pharmacy Initial Fill Coordination Note  Jason Day is a 83 y.o. male contacted today regarding initial fill of specialty medication(s) Furosemide  (Furoscix , LASIX )   Patient requested [name] at [facility] at [address]   Delivery date: No data recorded  Verified address: No data recorded  Medication will be filled on 11/14/2023.   Patient is aware of $0 copayment.   Disenrolling as this is a one time fill.

## 2023-11-14 ENCOUNTER — Other Ambulatory Visit: Payer: Self-pay

## 2023-11-14 ENCOUNTER — Other Ambulatory Visit (HOSPITAL_COMMUNITY): Payer: Self-pay

## 2023-11-14 LAB — CUP PACEART REMOTE DEVICE CHECK
Battery Voltage: 95
Date Time Interrogation Session: 20250924112540
Implantable Lead Connection Status: 753985
Implantable Lead Implant Date: 20250211
Implantable Lead Location: 753862
Implantable Lead Model: 377
Implantable Lead Serial Number: 8001791902
Implantable Pulse Generator Implant Date: 20250211
Pulse Gen Serial Number: 1000357520

## 2023-11-18 ENCOUNTER — Other Ambulatory Visit (HOSPITAL_COMMUNITY): Payer: Self-pay

## 2023-11-18 ENCOUNTER — Other Ambulatory Visit (HOSPITAL_BASED_OUTPATIENT_CLINIC_OR_DEPARTMENT_OTHER): Payer: Self-pay

## 2023-11-18 ENCOUNTER — Other Ambulatory Visit: Payer: Self-pay

## 2023-11-18 MED ORDER — POTASSIUM CHLORIDE CRYS ER 20 MEQ PO TBCR
40.0000 meq | EXTENDED_RELEASE_TABLET | Freq: Every day | ORAL | 1 refills | Status: DC
  Filled 2023-11-18: qty 30, 15d supply, fill #0

## 2023-11-18 NOTE — Progress Notes (Signed)
 Paramedicine Encounter    Patient ID: Jason Day, male    DOB: 02-Apr-1940, 83 y.o.   MRN: [id number]   Complaints-denies   Edema-none   Compliance with meds-yes  Pill box filled-no  If so, by whom-n/a  Refills needed-n/a  Pt was seen in clinic last week and was given furoscix - he has dropped 9lbs from last week. He had 2 days of being sob which resolved itself after about . Both while at rest sitting. Denies any further symptoms during that episode.  He missed 1 pill of the potassium. He went ahead and took that last dose.  He resumed his normal lasix  dose yesterday.  He reports feeling much better. No dizziness.  No c/p.  He had f/u on Thursday this week for recheck.  He has no refills left on potassium-she would like the request of refills go ahead and sent in for her.  Will f/u with pt at clinic visit.    BP 98/60   Pulse 70   Resp 15   Wt 133 lb 14.4 oz (60.7 kg)   SpO2 98%   BMI 18.16 kg/m  Weight yesterday-135 Last visit weight-146 @ clinic   Patient Care Team: Tysinger, Alm RAMAN, PA-C as PCP - General (Family Medicine) Cindie Ole DASEN, MD as PCP - Electrophysiology (Cardiology) Delford Maude BROCKS, MD as PCP - Cardiology (Cardiology) Delford Maude BROCKS, MD as Consulting Physician (Cardiology) Melita Drivers, MD as Consulting Physician (Orthopedic Surgery) Watt Norleen, MD as Attending Physician (Urology) Rolan Ezra RAMAN, MD as Consulting Physician (Cardiology) Stacia Glendia BRAVO, MD as Consulting Physician (Gastroenterology) Lorin Norris, MD as Consulting Physician (Allergy)  Patient Active Problem List   Diagnosis Date Noted   Risk for falls 10/28/2023   DNR (do not resuscitate) 10/28/2023   History of liver disease 10/28/2023   Transaminitis 10/17/2023   Rectal bleeding 10/14/2023   Acute renal failure superimposed on stage 3a chronic kidney disease (HCC) 10/14/2023   Appetite impaired 09/26/2023   Lactose intolerance 09/26/2023   Bowel incontinence  03/30/2023   Supratherapeutic INR 03/29/2023   Chronic heart failure with mildly reduced ejection fraction (HFmrEF, 41-49%) (HCC) 03/29/2023   Acute on chronic systolic (congestive) heart failure (HCC) 03/29/2023   CKD stage 3a, GFR 45-59 ml/min (HCC) 03/29/2023   Skin lesion 11/06/2022   Marital conflict 11/17/2021   Memory impairment 12/30/2020   Coagulation defect 12/30/2020   Incontinence of feces 12/30/2020   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   Chronic diarrhea 12/29/2020   Weight loss 12/29/2020   Depression, major, single episode, 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/29/2020   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   Mitral regurgitation 12/25/2018   S/P reverse total shoulder arthroplasty, 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   Change in behavior 08/28/2017   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   Elevated PSA 04/23/2017   Serum total bilirubin elevated 04/22/2017   Hemorrhoids 04/22/2017   Permanent atrial fibrillation (HCC)    Chronic combined systolic and diastolic heart failure (HCC)    Arteriosclerosis of coronary artery bypass graft    Hyperlipidemia    Elevated troponin 10/16/2016   Coronary artery disease     Current Outpatient Medications:    apixaban  (ELIQUIS ) 2.5 MG TABS tablet, Take 1 tablet (2.5 mg total) by mouth 2 (two) times daily., Disp: 60 tablet, Rfl: 2   cholecalciferol (VITAMIN D3) 25 MCG (1000 UNIT) tablet, Take 5,000 Units by mouth daily., Disp: , Rfl:    dapagliflozin  propanediol (FARXIGA ) 10 MG TABS tablet, Take 1 tablet (10 mg total) by mouth daily before breakfast.,  Disp: 90 tablet, Rfl: 3   desvenlafaxine  (PRISTIQ ) 50 MG 24 hr tablet, Take 1 tablet (50 mg total) by mouth daily., Disp: 30 tablet, Rfl: 1   Furosemide  (FUROSCIX ) 80 MG/10ML CTKT, Inject 80 mg into the skin daily as needed (ONLY USE AS ADVISED BY THE HEART FAILURE CLINIC)., Disp: 10 each, Rfl: 0   furosemide  (LASIX ) 40 MG tablet, Take 1 tablet (40 mg total) by mouth daily., Disp: 90 tablet, Rfl: 0   metoprolol  succinate (TOPROL -XL) 25 MG 24 hr  tablet, Take 0.5 tablets (12.5 mg total) by mouth daily. 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  Tobacco Use   Smoking status: Former    Current packs/day: 0.00    Types: Cigarettes    Quit date: 1970    Years since quitting: 55.7    Passive exposure: Never   Smokeless tobacco: Never   Tobacco comments:    Quit in 1970  Vaping Use   Vaping status: Never Used  Substance and Sexual Activity   Alcohol  use: No   Drug use: No   Sexual activity: Yes  Other Topics Concern   Not on file  Social History Narrative   Married    Originally from [place]  moved to [place]  age 64 moved to [facility] 2018   1 son Rommel [place] in [place]   1dtr [place]       Has living will   Wife is health care POA--then son   Would accept resuscitation   No tube feeds if cognitively unaware   Social Drivers of Health   Financial Resource Strain: Low Risk  (12/04/2022)   Overall Financial Resource Strain (CARDIA)    Difficulty of Paying Living Expenses: Not hard at all  Food Insecurity: No Food Insecurity (10/15/2023)   Hunger Vital Sign    Worried About Running Out of Food in the Last Year: Never true    Ran Out of Food in the Last Year: Never true  Transportation Needs: No Transportation Needs (10/15/2023)   PRAPARE - Administrator, Civil Service (Medical): No    Lack of Transportation (Non-Medical): No   Physical Activity: Insufficiently Active (12/04/2022)   Exercise Vital Sign    Days of Exercise per Week: 3 days    Minutes of Exercise per Session: 30 min  Stress: No Stress Concern Present (12/04/2022)   [employer] of Occupational Health - Occupational Stress Questionnaire    Feeling of Stress : Not at all  Social Connections: Moderately Isolated (10/15/2023)   Social Connection and Isolation Panel    Frequency of Communication with Friends and Family: Never    Frequency of Social Gatherings with Friends and Family: Twice a week    Attends Religious Services: More than 4 times per year    Active Member of Clubs or Organizations: No  Attends Banker Meetings: Never    Marital Status: Married  Catering manager Violence: Not At Risk (10/15/2023)   Humiliation, Afraid, Rape, and Kick questionnaire    Fear of Current or Ex-Partner: No    Emotionally Abused: No    Physically Abused: No    Sexually Abused: No    Physical Exam      Future Appointments  Date Time Provider Department Center  11/21/2023 10:30 AM [facility] PA/NP [facility] None  12/26/2023  2:50 PM P[facility]-ANNUAL WELLNESS VISIT [facility] [id number]  01/28/2024 11:20 AM Lesia Ozell Barter, PA-C [facility] H&V  02/13/2024  7:00 AM [facility] DEVICE REMOTES [facility]  03/05/2024  9:30 AM [facility] PA/NP [facility] None  05/13/2024  8:15 AM [facility] DEVICE REMOTES [facility]V  08/12/2024  8:15 AM [facility] DEVICE REMOTES [facility]  11/11/2024  8:15 AM [facility]VT DEVICE REMOTES [facility]V       Izetta Quivers, Paramedic [phone] [facility] Paramedic  11/18/23

## 2023-11-18 NOTE — Progress Notes (Signed)
 Remote PPM Transmission

## 2023-11-19 ENCOUNTER — Other Ambulatory Visit (HOSPITAL_COMMUNITY): Payer: Self-pay

## 2023-11-20 ENCOUNTER — Other Ambulatory Visit (HOSPITAL_COMMUNITY): Payer: Self-pay

## 2023-11-20 ENCOUNTER — Telehealth (HOSPITAL_COMMUNITY): Payer: Self-pay

## 2023-11-20 NOTE — Progress Notes (Incomplete)
 ADVANCED HF CLINIC NOTE  PCP: Jason Alm RAMAN, PA-C Cardiology: Dr. Delford HF Cardiology: Dr. Rolan  HPI: 83 y.o. with history of CAD s/p CABG, ischemic cardiomyopathy, and permanent atrial fibrillation.  Patient had CABG x 5 in 8/18.  Post-op echo: EF 35%. Since that time, LV EF has ranged from 25-35%. Echo 1/24: EF 25-30%, moderate LV dilation, severely decreased RV systolic function, severe LAE, moderate MR. He had post-op atrial fibrillation. He failed amiodarone  and cardioversion, and it was decided not to pursue AF ablation.  He is in permanent atrial fibrillation.  In 12/23, he had a presyncopal episode.  Zio monitor was placed showing AF rate ranging 29 - 100 bpm, 46 pauses with the longest 4 seconds (at night). Also 28 NSVT runs, longest 18 beats.   Seen for initial HF visit in 2/24. Volume stable. Started on Farxiga .   He was admitted in 2/25 with bradycardia, suspected atrial fibrillation with complete heart block.  Biotronik PPM with left bundle lead was placed (he had not wanted ICD in past and now with advanced age). Echo in 2/25 showed EF 45-50%, basal inferior aneurysm, RV low normal function, severe LAE, IVC normal, moderate MR (possibly infarct-related).   Admitted 8/25 with rectal bleeding. GI consulted and felt 2/2 hemorrhoids. INR supratherapeutic on admit at 9.0. Coumadin  held. Labs showed significant transaminitis with AST and ALT greater than 1000. INR reversed w/ Vit K. Also w/ AKI, SCr 2.68 on admit (baseline ~1.6). Echo showed marked drop in LVEF since previous study 6 months ago, EF now 20-25% w/ global HK, mod concentric LVH. 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 AHF consulted, diuresed with IV lasix  and GDMT titrated. Drop in EF felt to be pacemaker-medicated cardiomyopathy with wide QRS (186 msec) despite left bundle pacing. Seen by EP, not a candidate for transition to BiV pacemaker given  overall picture per Dr. Cindie. Started on Eliquis  2.5 bid for [facility]. He was discharged home, weight 156 lbs.   Today he returns for AHF follow up. At [facility] clinic visit 11/13/23 he was volume overloaded and he was sent home with Furoscix . Not interested in going back to hospital or additional in-home support at that time. Overall feeling ***. Denies palpitations, CP, dizziness, edema, or PND/Orthopnea. *** SOB. Appetite ok. No fever or chills. Weight at home *** pounds. Taking all medications. Denies ETOH, tobacco or drug use.    PMH:  1. CAD: S/p CABG 8/18 with LIMA-LAD, SVG-D2, SVG-OM1, seq SVG-OM2 and OM3.  2. Atrial fibrillation: Post-op CABG initially.  Failed amiodarone  and cardioversion. Decided against ablation in 2020. Now permanent.  3. Chronic systolic CHF: Ischemic cardiomyopathy.  - Echo (1/19): EF 35% - Echo (2020): EF 30-35% - Echo (8/22): EF 30-35%, moderate-severe MR - Echo (1/24): EF 25-30%, moderate LV dilation, severely decreased RV systolic function, severe LAE, moderate MR.  - Echo (2/25): EF 45-50%, basal inferior aneurysm, RV low normal function, severe LAE, IVC normal, moderate MR (possibly infarct-related).  - Echo (8/25): EF 20-25% diffuse HK with basal septal aneurysm, RV mod reduced, severe MR and TR 4. 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  Moderate-severe on 8/22 echo but moderate on 1/24 echo.  - Echo 8/25: moderate MR 5. Bradycardia: Presyncopal episode in 12/23.  Zio monitor (1/24) with AF rate ranging 29 - 100 bpm, 46 pauses with the longest 4 seconds (at night).  - Complete heart block 2/25, placement of Biotronik PPM  with left bundle lead.  6. NSVT: 28 NSVT runs on 1/24 Zio monitor, longest 18 beats.  7. Mild dementia 8. 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  Married.  Quit smoking remotely.  Rare ETOH.   Family History  Problem Relation Age of Onset   COPD Father    Emphysema Father    Healthy Brother    Diabetes Neg Hx     Cancer Neg Hx    Stomach cancer Neg Hx    Colon cancer Neg Hx    Allergic rhinitis Neg Hx    Angioedema Neg Hx    Asthma Neg Hx    Eczema Neg Hx    Urticaria Neg Hx    ROS: All systems reviewed and negative except as per HPI.   Current Outpatient Medications  Medication Sig Dispense Refill   apixaban  (ELIQUIS ) 2.5 MG TABS tablet Take 1 tablet (2.5 mg total) by mouth 2 (two) times daily. 60 tablet 2   cholecalciferol (VITAMIN D3) 25 MCG (1000 UNIT) tablet Take 5,000 Units by mouth daily.     dapagliflozin  propanediol (FARXIGA ) 10 MG TABS tablet Take 1 tablet (10 mg total) by mouth daily before breakfast. 90 tablet 3   desvenlafaxine  (PRISTIQ ) 50 MG 24 hr tablet Take 1 tablet (50 mg total) by mouth daily. 30 tablet 1   Furosemide  (FUROSCIX ) 80 MG/10ML CTKT Inject 80 mg into the skin daily as needed (ONLY USE AS ADVISED BY THE HEART FAILURE CLINIC). 10 each 0   furosemide  (LASIX ) 40 MG tablet Take 1 tablet (40 mg total) by mouth daily. 90 tablet 0   metoprolol  succinate (TOPROL -XL) 25 MG 24 hr tablet Take 0.5 tablets (12.5 mg total) by mouth daily. Take 12.5mg  daily 45 tablet 3   potassium chloride  SA (KLOR-CON  M) 20 MEQ tablet Take 2 tablets (40 mEq total) by mouth daily for the next 3 days with Furoscix  30 tablet 1   spironolactone  (ALDACTONE ) 25 MG tablet Take 0.5 tablets (12.5 mg total) by mouth daily. 90 tablet 0   No current facility-administered medications for this visit.   Wt Readings from Last 3 Encounters:  11/18/23 60.7 kg (133 lb 14.4 oz)  11/13/23 66.2 kg (146 lb)  11/07/23 64.9 kg (143 lb)   There were no vitals taken for this visit.  Physical Exam: General:  *** appearing.  No respiratory difficulty Neck: JVD *** cm.  Cor: Regular rate & rhythm. No murmurs. Lungs: clear Extremities: no edema  Neuro: alert & oriented x 3. Affect pleasant.   ReDs reading: *** %, {Norm/abn:16337}   EKG *** (Personally reviewed)    Assessment/Plan: 1. Acut on Chronic Systolic  Heart Failure, w/ Worsening Biventricular Failure: H/o ischemic CMP. Patient had CABG x 5 in 8/18. Post-op echo showed EF 35%.  Since that time, EF had mostly ranged from 25-30% but did improve to 45-50% on echo 2/25. s/p Biotronik PPM with LB lead 2/25 for [facility].  Echo this admit 8/25: EF 20-25% diffuse HK with basal septal aneurysm, RV mod reduced, severe MR and TR. No chest pain, doubt ACS.  Concern for pacemaker-medicated cardiomyopathy with wide QRS (186 msec) despite left bundle pacing. Seen by EP, not a candidate for transition to BiV pacemaker given overall picture per Dr. Cindie.   - NYHA II-early III, functional status confounded by deconditioning. He remains volume overloaded today on exam, ReDs 40%. *** - He does not want anymore pills. - Use Furoscix  + 40 KCL daily x 3 days (  hold Lasix  while using Furoscix ). CMET and BNP today. - After 3 days, resume Lasix  40 mg daily. - Continue Farxiga  10 mg daily - Continue Toprol  XL 12.5 mg daily - Continue spiro 12.5 mg daily 2. Transaminitis: This may be due to hepatic congestion from RV failure as well as hypotension prior to admission. Small volume ascites noted on US . LFTs now trending down.  - LFTs today.  3. CKD IIIb: Baseline creatinine ~1.5.  - Continue Farxiga . BMET today. - Avoid hypotension.  4. Rectal Bleeding: Due to internal hemorrhoids + supratherapeutic INR (9). Hgb stable >13. No s/s bleeding 5. Supratherapeutic INR: Due to warfarin use + hepatic dysfunction.  - Now off warfarin, on Eliquis  2.5 mg bid (lower dosing for age, wt < 60 kg). No abnormal bleeding. 6. Atrial fibrillation: Permanent. Rate control acceptable.  - Continue Toprol  XL 12.5 daily.  - Continue Eliquis  7. CAD: h/o CABG. No CP - No ASA as will be on Eliquis .  - Consider adding back statin, as LFTs trend back down.   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  - Treatment will be HF optimization w/ diuresis.   9. Suspect dementia: a/0 x 4  today. 10. Cachexia: There is no height or weight on file to calculate BMI.  - Chronic persistent weight loss - Has Ensures   - GI follows, has IBS  11. GOC: Both he and his wife are DNR. Wife is currently able to care for him at home without assistance. Their daughter is a pediatric hospice RN, and they are familiar with resources available to them, should they need. - Long discussion with Jason Day and his wife about trajectory of HF. Wife has good understanding of prognosis, and Jason Day is clear his goal is to stay out of the hospital.  - Continue Paramedicine support. - Will plan to see back for a couple visits until fluid stable, then have a clear plan for them how to manage fluid at home (likely with Furoscix  PRN)  Follow up in 7-10 days with APP (will need ECG, BMET and Mag).  Beckey LITTIE Coe, NP 11/20/2023

## 2023-11-20 NOTE — Telephone Encounter (Signed)
 Called to confirm/remind patient of their appointment at the [facility] on 11/21/23 10:30.   Appointment:   [x] Confirmed  [] Left mess   [] No answer/No voice mail  [] VM Full/unable to leave message  [] Phone not in service  Patient reminded to bring all medications and/or complete list.  Confirmed patient has transportation. Gave directions, instructed to utilize valet parking.

## 2023-11-20 NOTE — Progress Notes (Signed)
 Paramedicine Encounter    Patient ID: Jason Day, male    DOB: [birth year], 83 y.o.   MRN: [id number]  Pts wife called me this morning to see if I could come by to check on pt b/c he had a fall and his elbow had been bleeding.  I was able to go by there. This fall occurred yesterday. She reports he was bending over and then he fell back into the wall. No head injury. No neck or back pain. He did not fall to the floor, he just fell back into the wall causing a skin tear. She was able to bandage it yesterday and he re-bandaged it this morning and it was still bleeding.  I removed the bandages and he had a skin tear to his left elbow. The bleeding had stopped. She had some bacteriocin type ointment she had put on it.  I placed more of the ointment on it, covered with anti-stick vaseline dressings and covered with 4x4 gauzes and wrapped with cling.   She had been taking home b/p readings-mostly in the 120's/70s .   He has f/u in clinic tomor.    Patient Care Team: Tysinger, Alm RAMAN, PA-C as PCP - General (Family Medicine) Cindie Ole DASEN, MD as PCP - Electrophysiology (Cardiology) Delford Maude BROCKS, MD as PCP - Cardiology (Cardiology) Delford Maude BROCKS, MD as Consulting Physician (Cardiology) Melita Drivers, MD as Consulting Physician (Orthopedic Surgery) Watt Norleen, MD as Attending Physician (Urology) Rolan Ezra RAMAN, MD as Consulting Physician (Cardiology) Stacia Glendia BRAVO, MD as Consulting Physician (Gastroenterology) Lorin Norris, MD as Consulting Physician (Allergy)  Patient Active Problem List   Diagnosis Date Noted   Risk for falls 10/28/2023   DNR (do not resuscitate) 10/28/2023   History of liver disease 10/28/2023   Transaminitis 10/17/2023   Rectal bleeding 10/14/2023   Acute renal failure superimposed on stage 3a chronic kidney disease (HCC) 10/14/2023   Appetite impaired 09/26/2023   Lactose intolerance 09/26/2023   Bowel incontinence 03/30/2023   Supratherapeutic INR  03/29/2023   Chronic heart failure with mildly reduced ejection fraction (HFmrEF, 41-49%) (HCC) 03/29/2023   Acute on chronic systolic (congestive) heart failure (HCC) 03/29/2023   CKD stage 3a, GFR 45-59 ml/min (HCC) 03/29/2023   Skin lesion 11/06/2022   Marital conflict 11/17/2021   Memory impairment 12/30/2020   Coagulation defect 12/30/2020   Incontinence of feces 12/30/2020   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   Chronic diarrhea 12/29/2020   Weight loss 12/29/2020   Depression, major, single episode, mild 12/29/2020   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   Mitral regurgitation 12/25/2018   S/P reverse total shoulder arthroplasty, 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   Change in behavior 08/28/2017   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   Elevated PSA 04/23/2017   Serum total bilirubin elevated 04/22/2017   Hemorrhoids 04/22/2017   Permanent atrial fibrillation (HCC)    Chronic combined systolic and diastolic heart failure (HCC)    Arteriosclerosis of coronary artery bypass graft    Hyperlipidemia    Elevated troponin 10/16/2016   Coronary artery disease     Current Outpatient Medications:    apixaban  (ELIQUIS ) 2.5 MG TABS tablet, Take 1 tablet (2.5 mg total) by mouth 2 (two) times daily., Disp: 60 tablet, Rfl: 2   cholecalciferol (VITAMIN D3) 25 MCG (1000 UNIT) tablet, Take 5,000 Units by mouth daily., Disp: , Rfl:    dapagliflozin  propanediol (FARXIGA ) 10 MG TABS tablet, Take 1 tablet (10 mg total) by mouth daily before breakfast., Disp: 90 tablet, Rfl: 3  desvenlafaxine  (PRISTIQ ) 50 MG 24 hr tablet, Take 1 tablet (50 mg total) by mouth daily., Disp: 30 tablet, Rfl: 1   Furosemide  (FUROSCIX ) 80 MG/10ML CTKT, Inject 80 mg into the skin daily as needed (ONLY USE AS ADVISED BY THE HEART FAILURE CLINIC)., Disp: 10 each, Rfl: 0   furosemide  (LASIX ) 40 MG tablet, Take 1 tablet (40 mg total) by mouth daily., Disp: 90 tablet, Rfl: 0   metoprolol  succinate (TOPROL -XL) 25 MG 24 hr tablet, Take 0.5 tablets (12.5 mg  total) by mouth daily. 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  Tobacco Use   Smoking status: Former    Current packs/day: 0.00    Types: Cigarettes    Quit date: 1970    Years since quitting: 55.7    Passive exposure: Never   Smokeless tobacco: Never   Tobacco comments:    Quit in 1970  Vaping Use   Vaping status: Never Used  Substance and Sexual Activity   Alcohol  use: No   Drug use: No   Sexual activity: Yes  Other Topics Concern   Not on file  Social History Narrative   Married    Originally from [place]  moved to [place]  age 71 moved to [facility] 2018   1 son Carlis Blanch[place] in [place]   1dtr [place]       Has living will   Wife is health care POA--then son   Would accept resuscitation   No tube feeds if cognitively unaware   Social Drivers of Health   Financial Resource Strain: Low Risk  (12/04/2022)   Overall Financial Resource Strain (CARDIA)    Difficulty of Paying Living Expenses: Not hard at all  Food Insecurity: No Food Insecurity (10/15/2023)   Hunger Vital Sign    Worried About Running Out of Food in the Last Year: Never true    Ran Out of Food in the Last Year: Never true  Transportation Needs: No Transportation Needs (10/15/2023)   PRAPARE - Administrator, Civil Service (Medical): No    Lack of Transportation (Non-Medical): No  Physical Activity:  Insufficiently Active (12/04/2022)   Exercise Vital Sign    Days of Exercise per Week: 3 days    Minutes of Exercise per Session: 30 min  Stress: No Stress Concern Present (12/04/2022)   [employer] of Occupational Health - Occupational Stress Questionnaire    Feeling of Stress : Not at all  Social Connections: Moderately Isolated (10/15/2023)   Social Connection and Isolation Panel    Frequency of Communication with Friends and Family: Never    Frequency of Social Gatherings with Friends and Family: Twice a week    Attends Religious Services: More than 4 times per year    Active Member of [employer] or Organizations: No    Attends Banker Meetings:  Never    Marital Status: Married  Catering manager Violence: Not At Risk (10/15/2023)   Humiliation, Afraid, Rape, and Kick questionnaire    Fear of Current or Ex-Partner: No    Emotionally Abused: No    Physically Abused: No    Sexually Abused: No    Physical Exam      Future Appointments  Date Time Provider Department Center  11/21/2023 10:30 AM [facility] PA/NP [facility] None  12/26/2023  2:50 PM P[facility]-ANNUAL WELLNESS VISIT [facility] [id number]  01/28/2024 11:20 AM Lesia Ozell Barter, PA-C [facility] H&V  02/13/2024  7:00 AM [facility] DEVICE REMOTES [facility] H&V  03/05/2024  9:30 AM [facility] PA/NP [facility] None  05/13/2024  8:15 AM CVD HVT DEVICE REMOTES [facility] H&V  08/12/2024  8:15 AM [facility] HVT DEVICE REMOTES [facility] H&V  11/11/2024  8:15 AM [facility] HVT DEVICE REMOTES [facility] H&V       Izetta Quivers, Paramedic [phone] [facility] Paramedic  11/20/23

## 2023-11-21 ENCOUNTER — Ambulatory Visit (HOSPITAL_COMMUNITY): Payer: Self-pay | Admitting: Internal Medicine

## 2023-11-21 ENCOUNTER — Other Ambulatory Visit (HOSPITAL_COMMUNITY): Payer: Self-pay

## 2023-11-21 ENCOUNTER — Encounter (HOSPITAL_COMMUNITY): Payer: Self-pay

## 2023-11-21 ENCOUNTER — Ambulatory Visit (HOSPITAL_COMMUNITY)
Admission: RE | Admit: 2023-11-21 | Discharge: 2023-11-21 | Disposition: A | Discharge status: Home / Self Care | Source: Ambulatory Visit | Attending: Internal Medicine | Admitting: Internal Medicine

## 2023-11-21 VITALS — BP 108/66 | HR 76 | Ht 72.0 in | Wt 138.0 lb

## 2023-11-21 DIAGNOSIS — I5022 Chronic systolic (congestive) heart failure: Secondary | ICD-10-CM | POA: Diagnosis not present

## 2023-11-21 DIAGNOSIS — Z7901 Long term (current) use of anticoagulants: Secondary | ICD-10-CM | POA: Insufficient documentation

## 2023-11-21 DIAGNOSIS — I5042 Chronic combined systolic (congestive) and diastolic (congestive) heart failure: Secondary | ICD-10-CM

## 2023-11-21 DIAGNOSIS — K7689 Other specified diseases of liver: Secondary | ICD-10-CM | POA: Diagnosis not present

## 2023-11-21 DIAGNOSIS — Z66 Do not resuscitate: Secondary | ICD-10-CM | POA: Insufficient documentation

## 2023-11-21 DIAGNOSIS — N183 Chronic kidney disease, stage 3 unspecified: Secondary | ICD-10-CM

## 2023-11-21 DIAGNOSIS — I4821 Permanent atrial fibrillation: Secondary | ICD-10-CM | POA: Diagnosis not present

## 2023-11-21 DIAGNOSIS — Z7189 Other specified counseling: Secondary | ICD-10-CM

## 2023-11-21 DIAGNOSIS — R7401 Elevation of levels of liver transaminase levels: Secondary | ICD-10-CM | POA: Insufficient documentation

## 2023-11-21 DIAGNOSIS — I2510 Atherosclerotic heart disease of native coronary artery without angina pectoris: Secondary | ICD-10-CM | POA: Diagnosis not present

## 2023-11-21 DIAGNOSIS — K648 Other hemorrhoids: Secondary | ICD-10-CM | POA: Diagnosis not present

## 2023-11-21 DIAGNOSIS — N1832 Chronic kidney disease, stage 3b: Secondary | ICD-10-CM | POA: Diagnosis not present

## 2023-11-21 DIAGNOSIS — I5082 Biventricular heart failure: Secondary | ICD-10-CM | POA: Diagnosis not present

## 2023-11-21 DIAGNOSIS — I442 Atrioventricular block, complete: Secondary | ICD-10-CM | POA: Insufficient documentation

## 2023-11-21 DIAGNOSIS — Z87891 Personal history of nicotine dependence: Secondary | ICD-10-CM | POA: Diagnosis not present

## 2023-11-21 DIAGNOSIS — R64 Cachexia: Secondary | ICD-10-CM | POA: Insufficient documentation

## 2023-11-21 DIAGNOSIS — T45515A Adverse effect of anticoagulants, initial encounter: Secondary | ICD-10-CM | POA: Insufficient documentation

## 2023-11-21 DIAGNOSIS — Z951 Presence of aortocoronary bypass graft: Secondary | ICD-10-CM | POA: Diagnosis not present

## 2023-11-21 DIAGNOSIS — Z950 Presence of cardiac pacemaker: Secondary | ICD-10-CM | POA: Insufficient documentation

## 2023-11-21 DIAGNOSIS — I38 Endocarditis, valve unspecified: Secondary | ICD-10-CM

## 2023-11-21 DIAGNOSIS — K589 Irritable bowel syndrome without diarrhea: Secondary | ICD-10-CM | POA: Insufficient documentation

## 2023-11-21 DIAGNOSIS — I255 Ischemic cardiomyopathy: Secondary | ICD-10-CM | POA: Insufficient documentation

## 2023-11-21 DIAGNOSIS — R9431 Abnormal electrocardiogram [ECG] [EKG]: Secondary | ICD-10-CM | POA: Insufficient documentation

## 2023-11-21 DIAGNOSIS — R791 Abnormal coagulation profile: Secondary | ICD-10-CM | POA: Diagnosis not present

## 2023-11-21 DIAGNOSIS — Z681 Body mass index (BMI) 19 or less, adult: Secondary | ICD-10-CM | POA: Diagnosis not present

## 2023-11-21 DIAGNOSIS — Z79899 Other long term (current) drug therapy: Secondary | ICD-10-CM | POA: Insufficient documentation

## 2023-11-21 LAB — BASIC METABOLIC PANEL WITH GFR
Anion gap: 9 (ref 5–15)
BUN: 21 mg/dL (ref 8–23)
CO2: 32 mmol/L (ref 22–32)
Calcium: 8.6 mg/dL — ABNORMAL LOW (ref 8.9–10.3)
Chloride: 97 mmol/L — ABNORMAL LOW (ref 98–111)
Creatinine, Ser: 1.1 mg/dL (ref 0.61–1.24)
GFR, Estimated: >60 mL/min (ref >60)
Glucose, Bld: 131 mg/dL — ABNORMAL HIGH (ref 70–99)
Potassium: 4 mmol/L (ref 3.5–5.1)
Sodium: 138 mmol/L (ref 135–145)

## 2023-11-21 LAB — MAGNESIUM: Magnesium: 2.3 mg/dL (ref 1.7–2.4)

## 2023-11-21 LAB — BRAIN NATRIURETIC PEPTIDE: B Natriuretic Peptide: 842.2 pg/mL — ABNORMAL HIGH (ref 0.0–100.0)

## 2023-11-21 MED ORDER — SPIRONOLACTONE 25 MG PO TABS
25.0000 mg | ORAL_TABLET | Freq: Every day | ORAL | 3 refills | Status: DC
  Filled 2023-11-21: qty 90, 90d supply, fill #0

## 2023-11-21 NOTE — Progress Notes (Signed)
 Paramedicine Encounter   Patient ID: Jason Day , male,   DOB: [birth year],82 y.o.,  MRN: [id number]   Met patient in clinic today with provider.  Weight @ clinic-138  B/P-108/66 P-76 [id number] Reds clip-36%>>>>40% last time    Pt seen in clinic today for follow up from the furoscix  last week.  Weight is down.   Feeling better.  Had a few times of short of breath last week.   C/o hearing issues to the rt ear. He has been to ENT-but no answers.   Appetite ok. No bleeding issues.  Will f/u next week.   Med changes-just for today take 1 extra lasix  pill today  Increase spiro to full tablet daily.   He is concerned about his farxiga  grant and concerned he will not be able to refill it thru the grant-I will reach out to pharmacy team and send stephanie a message.    Izetta Quivers, EMT-Paramedic [phone] 11/21/2023

## 2023-11-21 NOTE — Patient Instructions (Signed)
 Medication Changes:  TODAY take extra 40 MG (1 Tab) Lasix    Increase Spironolactone  to 25 MG (1 Tab) Daily  STOP Potassium   Lab Work:  Labs done today, your results will be available in MyChart, we will contact you for abnormal readings.    Special Instructions // Education:  Do the following things EVERYDAY: Weigh yourself in the morning before breakfast. Write it down and keep it in a log. Take your medicines as prescribed Eat low salt foods--Limit salt (sodium) to 2000 mg per day.  Stay as active as you can everyday Limit all fluids for the day to less than 2 liters   Follow-Up in: 2 weeks  At the [facility], you and your health needs are our priority. We have a designated team specialized in the treatment of Heart Failure. This Care Team includes your primary Heart Failure Specialized Cardiologist (physician), Advanced Practice Providers (APPs- Physician Assistants and Nurse Practitioners), and Pharmacist who all work together to provide you with the care you need, when you need it.   You may see any of the following providers on your designated Care Team at your next follow up:  Dr. Toribio Fuel Dr. Ezra Shuck Dr. Ria Commander Dr. Odis Brownie Greig Mosses, NP Caffie Shed, [place] [facility] [place], [place] Beckey Coe, NP [place] Lee, NP Tinnie Redman, PharmD   Please be sure to bring in all your medications bottles to every appointment.   Need to Contact Us :  If you have any questions or concerns before your next appointment please send us  a message through [place] or call our office at [phone].    TO LEAVE A MESSAGE FOR THE NURSE SELECT OPTION 2, PLEASE LEAVE A MESSAGE INCLUDING: YOUR NAME DATE OF BIRTH CALL BACK NUMBER REASON FOR CALL**this is important as we prioritize the call backs  YOU WILL RECEIVE A CALL BACK THE SAME DAY AS LONG AS YOU CALL BEFORE 4:00 PM

## 2023-11-21 NOTE — Progress Notes (Signed)
 ReDS Vest / Clip - 11/21/23 1000       ReDS Vest / Clip   Station Marker C    Ruler Value 27    ReDS Value Range Moderate volume overload    ReDS Actual Value 36

## 2023-11-22 ENCOUNTER — Ambulatory Visit: Payer: Self-pay | Admitting: Cardiology

## 2023-11-25 ENCOUNTER — Other Ambulatory Visit: Payer: Self-pay

## 2023-11-25 ENCOUNTER — Other Ambulatory Visit: Payer: Self-pay | Admitting: Cardiology

## 2023-11-25 ENCOUNTER — Other Ambulatory Visit: Payer: Self-pay | Admitting: Medical

## 2023-11-25 ENCOUNTER — Other Ambulatory Visit (HOSPITAL_COMMUNITY): Payer: Self-pay

## 2023-11-25 MED ORDER — DESVENLAFAXINE SUCCINATE ER 50 MG PO TB24
50.0000 mg | ORAL_TABLET | Freq: Every day | ORAL | 1 refills | Status: DC
  Filled 2023-11-25 – 2023-11-29 (×2): qty 30, 30d supply, fill #0
  Filled 2023-12-30: qty 30, 30d supply, fill #1

## 2023-11-25 MED ORDER — DAPAGLIFLOZIN PROPANEDIOL 10 MG PO TABS
10.0000 mg | ORAL_TABLET | Freq: Every day | ORAL | 3 refills | Status: DC
  Filled 2023-11-25 – 2023-12-09 (×2): qty 90, 90d supply, fill #0

## 2023-11-25 NOTE — Progress Notes (Signed)
 Paramedicine Encounter    Patient ID: Stanford Strauch, male    DOB: 1940-03-18, 83 y.o.   MRN: [id number]   Complaints-irate about this grant   Edema-none   Compliance with meds-yes   Pill box filled-no wife manages  If so, by whom-n/a  Refills needed-none      Pt reports he has been doing ok-weight staying around 132. No increased sob, no dizziness, no falls, no c/p. No edema noted.   He is getting irate with the lady at pharmacy about this farxiga  grant information. He is extremely concerned that he will not have enough money to cover his farxiga  thru 7/26.  The lady at pharmacy gave him phone number to grant.  I also tried to discuss this him on how it works and etc and he was very adamant about him running out of pills and money. I explained that we would not let him run out of medicine.  He had 60 days worth of the farxiga  at home so he cannot get it filled until end of nov and I tried telling him that we wont know how much each 90 days is until its time for that refill to see how much each one will take of his grant. He didn't like that idea, so I told him to call the insurance company and ask them.  This pretty much took up over an hour of the visit, but no other complaints.    BP 100/70   Pulse 82   Resp 16   Wt 132 lb (59.9 kg)   BMI 17.90 kg/m  Weight yesterday-132 Last visit weight-138 @ clinic   Patient Care Team: Tysinger, Alm RAMAN, PA-C as PCP - General (Family Medicine) Cindie Ole DASEN, MD as PCP - Electrophysiology (Cardiology) Delford Maude BROCKS, MD as PCP - Cardiology (Cardiology) Delford Maude BROCKS, MD as Consulting Physician (Cardiology) Melita Drivers, MD as Consulting Physician (Orthopedic Surgery) Watt Norleen, MD as Attending Physician (Urology) Rolan Ezra RAMAN, MD as Consulting Physician (Cardiology) Stacia Glendia BRAVO, MD as Consulting Physician (Gastroenterology) Lorin Norris, MD as Consulting Physician (Allergy)  Patient Active Problem List    Diagnosis Date Noted   Risk for falls 10/28/2023   DNR (do not resuscitate) 10/28/2023   History of liver disease 10/28/2023   Transaminitis 10/17/2023   Rectal bleeding 10/14/2023   Acute renal failure superimposed on stage 3a chronic kidney disease (HCC) 10/14/2023   Appetite impaired 09/26/2023   Lactose intolerance 09/26/2023   Bowel incontinence 03/30/2023   Supratherapeutic INR 03/29/2023   Chronic heart failure with mildly reduced ejection fraction (HFmrEF, 41-49%) (HCC) 03/29/2023   Acute on chronic systolic (congestive) heart failure (HCC) 03/29/2023   CKD stage 3a, GFR 45-59 ml/min (HCC) 03/29/2023   Skin lesion 11/06/2022   Marital conflict 11/17/2021   Memory impairment 12/30/2020   Coagulation defect 12/30/2020   Incontinence of feces 12/30/2020   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   Chronic diarrhea 12/29/2020   Weight loss 12/29/2020   Depression, major, single episode, mild 12/29/2020   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   Mitral regurgitation 12/25/2018   S/P reverse total shoulder arthroplasty, 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   Change in behavior 08/28/2017   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   Elevated PSA 04/23/2017   Serum total bilirubin elevated 04/22/2017   Hemorrhoids 04/22/2017   Permanent atrial fibrillation (HCC)    Chronic combined systolic and diastolic heart failure (HCC)    Arteriosclerosis of coronary artery bypass graft    Hyperlipidemia    Elevated troponin 10/16/2016  Coronary artery disease     Current Outpatient Medications:    apixaban  (ELIQUIS ) 2.5 MG TABS tablet, Take 1 tablet (2.5 mg total) by mouth 2 (two) times daily., Disp: 60 tablet, Rfl: 2   cholecalciferol (VITAMIN D3) 25 MCG (1000 UNIT) tablet, Take 5,000 Units by mouth daily., Disp: , Rfl:    dapagliflozin  propanediol (FARXIGA ) 10 MG TABS tablet, Take 1 tablet (10 mg total) by mouth daily before breakfast., Disp: 90 tablet, Rfl: 3   desvenlafaxine  (PRISTIQ ) 50 MG 24 hr tablet, Take 1 tablet (50  mg total) by mouth daily., Disp: 30 tablet, Rfl: 1   Furosemide  (FUROSCIX ) 80 MG/10ML CTKT, Inject 80 mg into the skin daily as needed (ONLY USE AS ADVISED BY THE HEART FAILURE CLINIC)., Disp: 10 each, Rfl: 0   furosemide  (LASIX ) 40 MG tablet, Take 1 tablet (40 mg total) by mouth daily., Disp: 90 tablet, Rfl: 0   metoprolol  succinate (TOPROL -XL) 25 MG 24 hr tablet, Take 0.5 tablets (12.5 mg total) by mouth daily. 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  Tobacco Use   Smoking status: Former    Current packs/day: 0.00    Types: Cigarettes    Quit date: 1970    Years since quitting: 55.8    Passive exposure: Never   Smokeless tobacco: Never   Tobacco comments:    Quit in 1970  Vaping Use   Vaping status: Never Used  Substance and Sexual Activity   Alcohol  use: No   Drug use: No   Sexual activity: Yes  Other Topics Concern   Not on file  Social History Narrative   Married    Originally from [place]  moved to [place]  age 22 moved to [facility] 2018   1 son Ladarrion Telfair in [place]   1dtr [place]       Has living will   Wife is health care POA--then son   Would accept resuscitation   No tube feeds if cognitively unaware   Social Drivers of Health   Financial Resource Strain: Low Risk  (12/04/2022)   Overall Financial Resource Strain (CARDIA)    Difficulty of Paying Living Expenses: Not hard at all  Food Insecurity: No Food Insecurity (10/15/2023)   Hunger Vital Sign    Worried About Running Out of Food in the Last Year: Never true    Ran Out of Food  in the Last Year: Never true  Transportation Needs: No Transportation Needs (10/15/2023)   PRAPARE - Administrator, Civil Service (Medical): No    Lack of Transportation (Non-Medical): No  Physical Activity: Insufficiently Active (12/04/2022)   Exercise Vital Sign    Days of Exercise per Week: 3 days    Minutes of Exercise per Session: 30 min  Stress: No Stress Concern Present (12/04/2022)   [employer] of Occupational Health - Occupational Stress Questionnaire    Feeling of Stress : Not at all  Social Connections: Moderately Isolated (10/15/2023)   Social Connection and Isolation Panel  Frequency of Communication with Friends and Family: Never    Frequency of Social Gatherings with Friends and Family: Twice a week    Attends Religious Services: More than 4 times per year    Active Member of [employer] or Organizations: No    Attends Banker Meetings: Never    Marital Status: Married  Catering manager Violence: Not At Risk (10/15/2023)   Humiliation, Afraid, Rape, and Kick questionnaire    Fear of Current or Ex-Partner: No    Emotionally Abused: No    Physically Abused: No    Sexually Abused: No    Physical Exam      Future Appointments  Date Time Provider Department Center  12/05/2023  9:30 AM [facility] PA/NP [facility] None  12/26/2023  2:50 PM [facility]-ANNUAL WELLNESS VISIT [facility] [address]  01/28/2024 11:20 AM Lesia Ozell Barter, PA-C [facility] H&V  02/13/2024  7:00 AM [facility] HVT DEVICE REMOTES [facility] H&V  03/05/2024  9:30 AM [facility] PA/NP [facility] None  05/13/2024  8:15 AM CVD HVT DEVICE REMOTES [facility] H&V  08/12/2024  8:15 AM [facility] HVT DEVICE REMOTES [facility] H&V  11/11/2024  8:15 AM CVD HVT DEVICE REMOTES [facility] H&V       Izetta Quivers, Paramedic [phone] [facility] Paramedic  11/25/23

## 2023-11-26 ENCOUNTER — Other Ambulatory Visit (HOSPITAL_COMMUNITY): Payer: Self-pay

## 2023-11-29 ENCOUNTER — Other Ambulatory Visit (HOSPITAL_COMMUNITY): Payer: Self-pay

## 2023-12-05 ENCOUNTER — Ambulatory Visit (HOSPITAL_COMMUNITY)
Admission: RE | Admit: 2023-12-05 | Discharge: 2023-12-05 | Disposition: A | Discharge status: Home / Self Care | Source: Ambulatory Visit | Attending: Physician Assistant | Admitting: Physician Assistant

## 2023-12-05 ENCOUNTER — Encounter (HOSPITAL_COMMUNITY): Payer: Self-pay

## 2023-12-05 ENCOUNTER — Other Ambulatory Visit (HOSPITAL_COMMUNITY): Payer: Self-pay

## 2023-12-05 VITALS — BP 118/70 | HR 45 | Ht 72.0 in | Wt 140.6 lb

## 2023-12-05 DIAGNOSIS — I5023 Acute on chronic systolic (congestive) heart failure: Secondary | ICD-10-CM

## 2023-12-05 NOTE — Progress Notes (Signed)
 Paramedicine Encounter   Patient ID: Jason Day , male,   DOB: [birth year],82 y.o.,  MRN: [id number]   Met patient in clinic today with provider.  Weight @ clinic-140 B/P-118/70 P-45 SP02-93 REDS clip-28%  Med changes-none   Upon checking in pt was very upset about the payment needed and about signing a form. Pt was here with the wife.  He got quite upset and very irate with everyone and wanted to leave.  Wife was able to get him a f/u next month, hopefully by then they will have spoken to someone in billing and have gotten this straightened out.  At last visit he was very irate about the farxiga  grant, he says he has the info and will update me when I come back to see him next week.   Izetta Quivers, EMT-Paramedic [phone] 12/05/2023

## 2023-12-05 NOTE — Progress Notes (Addendum)
 Patient presented for follow-up today. On arrival to the clinic he expressed frustration about recent bills sent to him by the hospital. He became increasingly upset in the exam while staff completed the check in process. After discussion with the patient, his wife and Paramedic, Izetta, we decided it would be best to reschedule the visit to next month. They were referred to the billing department to review their concerns.  His wife reports his fluid is the best it has been in weeks and he is able to tolerate more activity at home. Paramedicine will continue to follow him closely in the community.

## 2023-12-09 ENCOUNTER — Other Ambulatory Visit: Payer: Self-pay

## 2023-12-09 ENCOUNTER — Telehealth (HOSPITAL_COMMUNITY): Payer: Self-pay

## 2023-12-09 ENCOUNTER — Other Ambulatory Visit (HOSPITAL_COMMUNITY): Payer: Self-pay

## 2023-12-09 MED ORDER — TORSEMIDE 20 MG PO TABS
40.0000 mg | ORAL_TABLET | Freq: Every day | ORAL | 3 refills | Status: DC
  Filled 2023-12-09: qty 180, 90d supply, fill #0

## 2023-12-09 NOTE — Addendum Note (Signed)
 Addended by: Arantza Darrington, DALTON HERO on: 12/09/2023 03:22 PM   Modules accepted: Orders

## 2023-12-09 NOTE — Telephone Encounter (Signed)
 Called pts wife to update her on the medication plan. She will p/u torsemide either tomor or Thursday and will take the 80mg  of lasix  until then.  I will make home visit on Thursday to f/u with pt.   Izetta Quivers, EMT-Paramedic  [phone] 12/09/2023

## 2023-12-09 NOTE — Telephone Encounter (Signed)
 Wife called to report that his weight is up to 135-136--he's usually around 133lbs at home.  2 nights over the wknd he had trouble breathing and repositioned in the bed and felt better.  I have not been able to see him in the home yet-but wanted to go ahead and pass this along for further recommendations.   Wife reports b/p have been in the 120's systolic.    Izetta Quivers, EMT-Paramedic  [phone] 12/09/2023

## 2023-12-09 NOTE — Telephone Encounter (Signed)
 Pt aware via Katie,paramedicine  Katie with paramedicine aware  Script sent

## 2023-12-12 ENCOUNTER — Other Ambulatory Visit (HOSPITAL_COMMUNITY): Payer: Self-pay

## 2023-12-12 ENCOUNTER — Telehealth (HOSPITAL_COMMUNITY): Payer: Self-pay

## 2023-12-12 ENCOUNTER — Telehealth: Payer: Self-pay

## 2023-12-12 ENCOUNTER — Telehealth (HOSPITAL_COMMUNITY): Payer: Self-pay | Admitting: Cardiology

## 2023-12-12 DIAGNOSIS — I5022 Chronic systolic (congestive) heart failure: Secondary | ICD-10-CM

## 2023-12-12 MED ORDER — TORSEMIDE 20 MG PO TABS
60.0000 mg | ORAL_TABLET | Freq: Every day | ORAL | 3 refills | Status: DC
  Filled 2023-12-12: qty 270, 90d supply, fill #0

## 2023-12-12 NOTE — Telephone Encounter (Addendum)
 [employer] PPM alert:  HVR episodes increased burden  HVR episodes 12/07/17-12/10/23 show 3 short runs of VT. Uptick in HVR episodes and mean heart rate trend is now 105bpm.   (12/12/23 OV note to Heart Failure clinic patient expresses feeling poorly in recent days with increase in weight).   Patient is taking Metoprolol  succinate 12.5mg  daily.  Cc'ing to HF and Dr. Cindie as RICK.  Events likely correlate with HF exacerbation.  HF clinic following, will continue to monitor and has appt with them on 12/30/23.          12/11/23      12/10/23  12/08/23

## 2023-12-12 NOTE — Telephone Encounter (Signed)
 Per Alma Diaz,NP  Furoscix  x 2 days with kcl 40 (hold torsemide with furoscix )  Restart torsemide after furoscix  at 60 daily  Labs needed in one week   Katie aware and voiced understanding

## 2023-12-12 NOTE — Telephone Encounter (Signed)
 Pts wife called me to report that the furoscix  malfunctioned after about 2hrs of running. Start time was 8980~8780. I reached out to clinic and staff reported for her to contact the company by the number on the box and they should be able to calculate how much medicine he got from the start-stop times. So she did that but called me back reporting that it was useless and they were not able to assess how much medicine was given, so I relayed that to clinic staff and chantel was going to relay this to the provider and get further instructions.  She called me back around 5pm b/c we have not heard back yet but when I called the wife back she was on the phone with [place] from clinic and received further instructions.  I will f/u early next week.   Izetta Quivers, EMT-Paramedic  [phone] 12/12/2023

## 2023-12-12 NOTE — Telephone Encounter (Signed)
 Jason Day with paramedic called to report 137 weight today (normally 133 and increased since med change 10/20)  Reports wife was able to get torsemide started 10/22 and pt continued lasix  80 daily until the switch (see 10/20 phone note)  Poor appetite Increased SOB Feels very poorly No edema noted Furoscix  is in the home   HR 92  BP 110/70 O2 sats good lungs clear   Please advise

## 2023-12-12 NOTE — Telephone Encounter (Signed)
 Jason Day with paramedicine called to report patients notification of furoscix  malfunction  Start time 1019, malfunction noted at ~1219  (~2 hours of wear time) Per Furoscix  representative pt was advised to box kit and return for replacement, unable to provide dosage patient received based on start time    Ok to replace with new furoscix  or additional changes needed?  See message from device clinic regarding VT

## 2023-12-12 NOTE — Progress Notes (Addendum)
 Paramedicine Encounter    Patient ID: Jason Day, male    DOB: 1940/09/15, 83 y.o.   MRN: [id number]   Complaints-sob/not feeling well/no appetite  Edema-none seen   Compliance with meds-yes  Pill box filled-no -wife does If so, by whom-wife handles   Refills needed-n/a     Pt reports not feeling well.  He has had increased sob for past 3 days.  His HR is elevated from his last EKG. Showing in the 90s right now.  Not feeling well, not eating well.  Poor appetite.  No edema seen. His weight is actually increased since wife called me on Monday and his diuretics were adjusted.  Reached out to clinic ref this.  Per alma he is to take the furoscix  for 2 days with 40meq of K and then start torsemide 60mg  daily moving forward.  This was started during home visit and wife placed on pt. So on Saturday he is to start the 60mg  of torsemide. She understands this and does very well managing his meds.  Labs are sch for next week. I will f/u early next week.  EKG done and it showed pacemaker at rate of 94. Previewed last EKG and it was same just just higher rate. I left the pulse ox on him when he was walking up to his bedroom upstairs, his fingers are so cold though, so the readings werent the most consistent. The lowest I seen it was 93%.  Pt does get sob more with exertion or longer conversations.  I will f/u next week.  Pt and wife felt comfortable staying at home, he does not want to be back in hosp at all costs. He does not feel that he needs any further treatments or anything right now, no c/p or palpitations.    BP 110/70   Pulse 92   Resp (!) 25   Wt 137 lb (62.1 kg)   SpO2 95%   BMI 18.58 kg/m  Weight yesterday-138 Last visit weight-140  Patient Care Team: Tysinger, Alm RAMAN, PA-C as PCP - General (Family Medicine) Cindie Ole DASEN, MD as PCP - Electrophysiology (Cardiology) Delford Maude BROCKS, MD as PCP - Cardiology (Cardiology) Delford Maude BROCKS, MD as Consulting Physician  (Cardiology) Melita Drivers, MD as Consulting Physician (Orthopedic Surgery) Watt Norleen, MD as Attending Physician (Urology) Rolan Ezra RAMAN, MD as Consulting Physician (Cardiology) Stacia Glendia BRAVO, MD as Consulting Physician (Gastroenterology) Lorin Norris, MD as Consulting Physician (Allergy)  Patient Active Problem List   Diagnosis Date Noted   Risk for falls 10/28/2023   DNR (do not resuscitate) 10/28/2023   History of liver disease 10/28/2023   Transaminitis 10/17/2023   Rectal bleeding 10/14/2023   Acute renal failure superimposed on stage 3a chronic kidney disease (HCC) 10/14/2023   Appetite impaired 09/26/2023   Lactose intolerance 09/26/2023   Bowel incontinence 03/30/2023   Supratherapeutic INR 03/29/2023   Chronic heart failure with mildly reduced ejection fraction (HFmrEF, 41-49%) (HCC) 03/29/2023   Acute on chronic systolic (congestive) heart failure (HCC) 03/29/2023   CKD stage 3a, GFR 45-59 ml/min (HCC) 03/29/2023   Skin lesion 11/06/2022   Marital conflict 11/17/2021   Memory impairment 12/30/2020   Coagulation defect 12/30/2020   Incontinence of feces 12/30/2020   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   Chronic diarrhea 12/29/2020   Weight loss 12/29/2020   Depression, major, single episode, mild 12/29/2020   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   Mitral regurgitation 12/25/2018   S/P reverse total shoulder arthroplasty, 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  Change in behavior 08/28/2017   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   Elevated PSA 04/23/2017   Serum total bilirubin elevated 04/22/2017   Hemorrhoids 04/22/2017   Permanent atrial fibrillation (HCC)    Chronic combined systolic and diastolic heart failure (HCC)    Arteriosclerosis of coronary artery bypass graft    Hyperlipidemia    Elevated troponin 10/16/2016   Coronary artery disease     Current Outpatient Medications:    apixaban  (ELIQUIS ) 2.5 MG TABS tablet, Take 1 tablet (2.5 mg total) by mouth 2 (two) times  daily., Disp: 60 tablet, Rfl: 2   cholecalciferol (VITAMIN D3) 25 MCG (1000 UNIT) tablet, Take 5,000 Units by mouth daily., Disp: , Rfl:    dapagliflozin  propanediol (FARXIGA ) 10 MG TABS tablet, Take 1 tablet (10 mg total) by mouth daily before breakfast., Disp: 90 tablet, Rfl: 3   desvenlafaxine  (PRISTIQ ) 50 MG 24 hr tablet, Take 1 tablet (50 mg total) by mouth daily., Disp: 30 tablet, Rfl: 1   Furosemide  (FUROSCIX ) 80 MG/10ML CTKT, Inject 80 mg into the skin daily as needed (ONLY USE AS ADVISED BY THE HEART FAILURE CLINIC)., Disp: 10 each, Rfl: 0   metoprolol  succinate (TOPROL -XL) 25 MG 24 hr tablet, Take 0.5 tablets (12.5 mg total) by mouth daily. 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  Tobacco Use   Smoking status: Former    Current packs/day: 0.00    Types: Cigarettes    Quit date: 1970    Years since quitting: 55.8    Passive exposure: Never   Smokeless tobacco: Never   Tobacco comments:    Quit in 1970  Vaping Use   Vaping status: Never Used  Substance and Sexual Activity   Alcohol  use: No   Drug use: No   Sexual activity: Yes  Other Topics Concern   Not on file  Social History Narrative   Married    Originally from [place]  moved to [place]  age 1 moved to [facility] 2018   1 son Django Nguyen in [place]   1dtr [place]       Has living will   Wife is health care POA--then son   Would accept resuscitation   No tube feeds if cognitively  unaware   Social Drivers of Health   Financial Resource Strain: Low Risk  (12/04/2022)   Overall Financial Resource Strain (CARDIA)    Difficulty of Paying Living Expenses: Not hard at all  Food Insecurity: No Food Insecurity (10/15/2023)   Hunger Vital Sign    Worried About Running Out of Food in the Last Year: Never true    Ran Out of Food in the Last Year: Never true  Transportation Needs: No Transportation Needs (10/15/2023)   PRAPARE - Administrator, Civil Service (Medical): No    Lack of Transportation (Non-Medical): No  Physical Activity: Insufficiently Active (12/04/2022)   Exercise Vital Sign  Days of Exercise per Week: 3 days    Minutes of Exercise per Session: 30 min  Stress: No Stress Concern Present (12/04/2022)   [employer] of Occupational Health - Occupational Stress Questionnaire    Feeling of Stress : Not at all  Social Connections: Moderately Isolated (10/15/2023)   Social Connection and Isolation Panel    Frequency of Communication with Friends and Family: Never    Frequency of Social Gatherings with Friends and Family: Twice a week    Attends Religious Services: More than 4 times per year    Active Member of [employer] or Organizations: No    Attends Banker Meetings: Never    Marital Status: Married  Catering manager Violence: Not At Risk (10/15/2023)   Humiliation, Afraid, Rape, and Kick questionnaire    Fear of Current or Ex-Partner: No    Emotionally Abused: No    Physically Abused: No    Sexually Abused: No    Physical Exam      Future Appointments  Date Time Provider Department Center  12/19/2023  1:45 PM [facility] LAB [facility] None  12/26/2023  2:50 PM PFM-ANNUAL WELLNESS VISIT [facility] [address]  12/30/2023  9:00 AM [facility] PA/NP [facility] None  01/28/2024 11:20 AM Lesia Ozell Barter, PA-C [facility] H&V  02/13/2024  7:00 AM CVD HVT DEVICE REMOTES [facility] H&V  03/05/2024  9:30 AM [facility] PA/NP [facility] None   05/13/2024  8:15 AM CVD HVT DEVICE REMOTES [facility] H&V  08/12/2024  8:15 AM CVD HVT DEVICE REMOTES [facility] H&V  11/11/2024  8:15 AM CVD HVT DEVICE REMOTES [facility] H&V       Izetta Quivers, Paramedic [phone] [facility] Paramedic  12/12/23

## 2023-12-13 ENCOUNTER — Other Ambulatory Visit: Payer: Self-pay

## 2023-12-13 ENCOUNTER — Other Ambulatory Visit (HOSPITAL_COMMUNITY): Payer: Self-pay

## 2023-12-13 ENCOUNTER — Telehealth (HOSPITAL_COMMUNITY): Payer: Self-pay | Admitting: Cardiology

## 2023-12-13 NOTE — Telephone Encounter (Signed)
 Patients wife called   Reports furoscix  was placed ~0630 and stopped at 0930.  Patients is still SOB, very weak, just not feeling well   Above discussed with [name] Glena [facility] to arrange IV lasix  and KCL with infusion center today. This way we can ensure patient has received correct dose and jumpstart diuresing.     Pts wife reports he is too weak to come out of the home. Reports patient will not sit still for the duration of the infusion appt, would prefer to have patient stay home. Requested green light to use another furoscix   Per [name] Glena, NP Ok to use another furoscix  today

## 2023-12-16 ENCOUNTER — Other Ambulatory Visit: Payer: Self-pay

## 2023-12-16 ENCOUNTER — Other Ambulatory Visit (HOSPITAL_COMMUNITY): Payer: Self-pay

## 2023-12-16 NOTE — Progress Notes (Signed)
 Paramedicine Encounter    Patient ID: Jason Day, male    DOB: 06/11/1940, 83 y.o.   MRN: [id number]   Complaints-sob at times   Edema-no   Compliance with meds-yes  Pill box filled-no If so, by whom-wife handles  Refills needed-no  Pt wife reports that pt had a rough wknd, he was very weak, he had a fall Friday that was due from his feet get mixed up with each other as he was turning to get to bathroom quickly, he did hit the floor. He denied hitting his head no injuries noted to head area. She had to get neighbor to come over and help him up.  He does have bruising and skin tear to his left mid-upper back area and shoulder blade area and he was unable to get out of bed the whole wknd. Poor appetite. Wife also reports confusion off and on the wknd.  Today is much improved. He met me downstairs. He is alert and oriented. He does report occasional sob upon exertion.  Soreness to back area. Wife has it bandaged up. It is bruised with small skin tear.  Pts wife also reports that she tried 4 of the furoscix  this wknd and they all malfunctioned after a couple of hours. I think he is too skinny and bony for the furoscix  to work properly for long periods of time. He is extremely thin. But sounds like he got some of each of those in him this wknd and his weight is better so hope to keep it down with the oral diuretics.  She is having a very difficult time with getting the manufacturer to replace those.  His weight is back to baseline and he is feeling better. He is fixing to eat some breakfast.  No edema noted.  He is back on the 60mg  of torsemide per med list. He has labs on Thursday.  Wife handles his meds.  Pt wants to continue all medication therapy from clinic.   Will f/u post labs and early next week.   BP 108/70   Pulse 72   Resp 19   Wt 133 lb (60.3 kg)   BMI 18.04 kg/m  Weight yesterday-? Unable to weigh the past few days Last visit weight-137  Patient Care Team: Tysinger, Alm RAMAN, PA-C as PCP - General (Family Medicine) Cindie Ole DASEN, MD as PCP - Electrophysiology (Cardiology) Delford Maude BROCKS, MD as PCP - Cardiology (Cardiology) Delford Maude BROCKS, MD as Consulting Physician (Cardiology) Melita Drivers, MD as Consulting Physician (Orthopedic Surgery) Watt Norleen, MD as Attending Physician (Urology) Rolan Ezra RAMAN, MD as Consulting Physician (Cardiology) Stacia Glendia BRAVO, MD as Consulting Physician (Gastroenterology) Lorin Norris, MD as Consulting Physician (Allergy)  Patient Active Problem List   Diagnosis Date Noted   Risk for falls 10/28/2023   DNR (do not resuscitate) 10/28/2023   History of liver disease 10/28/2023   Transaminitis 10/17/2023   Rectal bleeding 10/14/2023   Acute renal failure superimposed on stage 3a chronic kidney disease (HCC) 10/14/2023   Appetite impaired 09/26/2023   Lactose intolerance 09/26/2023   Bowel incontinence 03/30/2023   Supratherapeutic INR 03/29/2023   Chronic heart failure with mildly reduced ejection fraction (HFmrEF, 41-49%) (HCC) 03/29/2023   Acute on chronic systolic (congestive) heart failure (HCC) 03/29/2023   CKD stage 3a, GFR 45-59 ml/min (HCC) 03/29/2023   Skin lesion 11/06/2022   Marital conflict 11/17/2021   Memory impairment 12/30/2020   Coagulation defect 12/30/2020   Incontinence of feces 12/30/2020  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   Chronic diarrhea 12/29/2020   Weight loss 12/29/2020   Depression, major, single episode, mild 12/29/2020   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   Mitral regurgitation 12/25/2018   S/P reverse total shoulder arthroplasty, 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   Change in behavior 08/28/2017   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   Elevated PSA 04/23/2017   Serum total bilirubin elevated 04/22/2017   Hemorrhoids 04/22/2017   Permanent atrial fibrillation (HCC)    Chronic combined systolic and diastolic heart failure (HCC)    Arteriosclerosis of coronary artery bypass graft     Hyperlipidemia    Elevated troponin 10/16/2016   Coronary artery disease     Current Outpatient Medications:    apixaban  (ELIQUIS ) 2.5 MG TABS tablet, Take 1 tablet (2.5 mg total) by mouth 2 (two) times daily., Disp: 60 tablet, Rfl: 2   cholecalciferol (VITAMIN D3) 25 MCG (1000 UNIT) tablet, Take 5,000 Units by mouth daily., Disp: , Rfl:    dapagliflozin  propanediol (FARXIGA ) 10 MG TABS tablet, Take 1 tablet (10 mg total) by mouth daily before breakfast., Disp: 90 tablet, Rfl: 3   desvenlafaxine  (PRISTIQ ) 50 MG 24 hr tablet, Take 1 tablet (50 mg total) by mouth daily., Disp: 30 tablet, Rfl: 1   Furosemide  (FUROSCIX ) 80 MG/10ML CTKT, Inject 80 mg into the skin daily as needed (ONLY USE AS ADVISED BY THE HEART FAILURE CLINIC)., Disp: 10 each, Rfl: 0   metoprolol  succinate (TOPROL -XL) 25 MG 24 hr tablet, Take 0.5 tablets (12.5 mg total) by mouth daily. 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 2018   1 son [place] in [place]   1dtr [place] in [place]       Has living will   Wife is health care POA--then son   Would accept resuscitation   No tube feeds if cognitively unaware   Social Drivers of Health   Financial Resource Strain: Low Risk  (12/04/2022)   Overall Financial Resource Strain (CARDIA)    Difficulty of Paying Living Expenses: Not hard at all  Food Insecurity: No Food Insecurity (10/15/2023)   Hunger Vital Sign    Worried About Running Out of Food in the Last Year: Never true    Ran Out of Food in the  Last Year: Never true  Transportation Needs: No Transportation Needs (10/15/2023)   PRAPARE - Administrator, Civil Service (Medical): No    Lack of Transportation (Non-Medical): No  Physical Activity: Insufficiently Active (12/04/2022)   Exercise Vital Sign    Days of Exercise per Week: 3 days    Minutes of Exercise per Session: 30 min  Stress: No Stress Concern Present (12/04/2022)   [employer] of Occupational Health - Occupational Stress Questionnaire    Feeling of Stress : Not at all  Social Connections: Moderately Isolated (10/15/2023)   Social Connection and Isolation Panel    Frequency of Communication with Friends and Family: Never    Frequency of Social Gatherings with Friends and Family: Twice a week    Attends Religious Services: More than 4 times per year    Active Member of [employer] or Organizations: No    Attends Banker Meetings: Never    Marital Status: Married  Catering Manager Violence: Not At Risk (10/15/2023)   Humiliation, Afraid, Rape, and Kick questionnaire    Fear of Current or Ex-Partner: No    Emotionally Abused: No    Physically Abused: No    Sexually Abused: No    Physical Exam      Future Appointments  Date Time Provider Department Center  12/19/2023  1:45 PM [facility] LAB [facility] None  12/26/2023  2:50 PM P[facility]-ANNUAL WELLNESS VISIT [facility] [id number]  12/30/2023  9:00 AM  [facility] PA/NP [facility] None  01/28/2024 11:20 AM Lesia Ozell Barter, PA-C [facility] H&V  02/13/2024  7:00 AM [facility] DEVICE REMOTES [facility]  03/05/2024  9:30 AM [facility] PA/NP [facility] None  05/13/2024  8:15 AM [facility] DEVICE REMOTES [facility] H[facility]V  08/12/2024  8:15 AM [facility] DEVICE REMOTES [facility] H&V  11/11/2024  8:15 AM CVD HVT DEVICE REMOTES [facility] H&V       Izetta Quivers, Paramedic [phone] [facility] Paramedic  12/16/23

## 2023-12-17 ENCOUNTER — Other Ambulatory Visit (HOSPITAL_COMMUNITY): Payer: Self-pay

## 2023-12-17 ENCOUNTER — Other Ambulatory Visit: Payer: Self-pay

## 2023-12-17 ENCOUNTER — Other Ambulatory Visit (HOSPITAL_COMMUNITY): Payer: Self-pay | Admitting: Pharmacist

## 2023-12-17 ENCOUNTER — Telehealth (HOSPITAL_COMMUNITY): Payer: Self-pay | Admitting: Pharmacist

## 2023-12-17 MED ORDER — FUROSCIX 80 MG/10ML ~~LOC~~ CTKT
80.0000 mg | CARTRIDGE | Freq: Every day | SUBCUTANEOUS | 0 refills | Status: DC | PRN
  Filled 2023-12-17: qty 10, 10d supply, fill #0

## 2023-12-17 NOTE — Telephone Encounter (Signed)
 Patient reports Furoscix  device malfunctions. Prescription resent in the event that manufacturer replacements can not be fulfilled prior to the weekend.

## 2023-12-17 NOTE — Progress Notes (Addendum)
 Specialty Pharmacy Refill Coordination Note  Jason Day is a 83 y.o. male contacted today regarding refills of specialty medication(s) No data recorded  Patient requested Delivery   Delivery date: 12/18/23   Verified address: [address]  [facility] [place] [address]   Medication will be filled on: 12/17/23    Disenrolling as this is a one time fill.

## 2023-12-19 ENCOUNTER — Telehealth (HOSPITAL_COMMUNITY): Payer: Self-pay

## 2023-12-19 ENCOUNTER — Ambulatory Visit (HOSPITAL_COMMUNITY)
Admission: RE | Admit: 2023-12-19 | Discharge: 2023-12-19 | Disposition: A | Discharge status: Home / Self Care | Source: Ambulatory Visit | Attending: Cardiology | Admitting: Cardiology

## 2023-12-19 ENCOUNTER — Other Ambulatory Visit: Payer: Self-pay

## 2023-12-19 ENCOUNTER — Ambulatory Visit (HOSPITAL_COMMUNITY): Payer: Self-pay | Admitting: Internal Medicine

## 2023-12-19 DIAGNOSIS — I5022 Chronic systolic (congestive) heart failure: Secondary | ICD-10-CM | POA: Diagnosis not present

## 2023-12-19 LAB — BASIC METABOLIC PANEL WITH GFR
Anion gap: 16 — ABNORMAL HIGH (ref 5–15)
BUN: 41 mg/dL — ABNORMAL HIGH (ref 8–23)
CO2: 33 mmol/L — ABNORMAL HIGH (ref 22–32)
Calcium: 8.8 mg/dL — ABNORMAL LOW (ref 8.9–10.3)
Chloride: 83 mmol/L — ABNORMAL LOW (ref 98–111)
Creatinine, Ser: 1.37 mg/dL — ABNORMAL HIGH (ref 0.61–1.24)
GFR, Estimated: 52 mL/min — ABNORMAL LOW (ref >60)
Glucose, Bld: 114 mg/dL — ABNORMAL HIGH (ref 70–99)
Potassium: 3 mmol/L — ABNORMAL LOW (ref 3.5–5.1)
Sodium: 132 mmol/L — ABNORMAL LOW (ref 135–145)

## 2023-12-19 LAB — BRAIN NATRIURETIC PEPTIDE: B Natriuretic Peptide: 1534 pg/mL — ABNORMAL HIGH (ref 0.0–100.0)

## 2023-12-19 NOTE — Progress Notes (Signed)
 Clinical Intervention Note  Patient wife reports multiple device failures on 4 Furoscix  devices which stopped infusing after approximately 3 hours with medication still remaining in the cartridge. Pharmacy has refilled prescription to supply 10 new devices to patient by tomorrow 10/29 and we are working with the manufacturer on replacement process for original devices. 12/19/23 Update - replacement devices have been received and will be delivered to patient on 11/5.   Clinical Intervention Outcomes: Improved therapy effectiveness  Delon CHRISTELLA Brow Specialty Pharmacist

## 2023-12-19 NOTE — Telephone Encounter (Signed)
 Patient came in today for labs. Reports patient was unable to use furoscix , requested weight check and reds. Weight today 134.6 and REDs is 34%. Patient denies increase in SOB, but patient states that it comes and goes. Patient currently is taking torsemide 60mg  daily.   Patients wife would like to know about what to do from her regarding furoscix . BMP and BNP drawn today. Spoke with Manuelita RIGGERS- advised to get labs back and go from there.   Advised patient's wife that we will call with instructions after labs come back.   Scheduled patient to come in next week for follow up appointment. Patients wife aware of all instructions and verbalized understanding.

## 2023-12-20 ENCOUNTER — Other Ambulatory Visit: Payer: Self-pay

## 2023-12-20 ENCOUNTER — Other Ambulatory Visit (HOSPITAL_COMMUNITY): Payer: Self-pay

## 2023-12-20 MED ORDER — TORSEMIDE 20 MG PO TABS
40.0000 mg | ORAL_TABLET | Freq: Two times a day (BID) | ORAL | 3 refills | Status: DC
  Filled 2023-12-20: qty 120, 30d supply, fill #0

## 2023-12-20 NOTE — Progress Notes (Signed)
 CE Tracking: [id number]  Mailing 11.3.25 to [address]  [facility] [place] [address]

## 2023-12-22 ENCOUNTER — Other Ambulatory Visit (HOSPITAL_COMMUNITY): Payer: Self-pay

## 2023-12-23 ENCOUNTER — Other Ambulatory Visit (HOSPITAL_COMMUNITY): Payer: Self-pay

## 2023-12-23 ENCOUNTER — Other Ambulatory Visit: Payer: Self-pay

## 2023-12-23 ENCOUNTER — Telehealth (HOSPITAL_COMMUNITY): Payer: Self-pay

## 2023-12-23 NOTE — Telephone Encounter (Signed)
 Pt had left me VM over the wknd to call her back, when I did she reports his activity level and appetite has declined a lot this wknd. Also reports he has been constipated for 3 wks and had a major loose BM this wknd too.  She reports he is barely eating anything.  Reviewed lab results with her from last week and she was confused on what he should be taking so I clarified that with her, she said the girl told her to continue the 3 tabs of torsemide daily and she wasn't aware of any changes. I relayed to her the notes I read and I didn't see anything different from that. She will start those changes today.  She is very tearful and concerned, she said he stays cold and stays in front of the heater all day.  She asked about hospice again, so I told her that she can gently approach it again to him and see what he wants to do. She wants hospice but he isnt agreeable quite yet. I told her that as long as he was in his right mind to make decisions then it is up to him and we dont want hospice to go out there and he refuse them and get irate and angry towards anybody coming out there.  So she will bring it up again and see what he says. She reports his feet are swollen but he is not sob, and has no complaints.  He has appoint on thurs in clinic so I will plan on meeting them there and available if needed before then.    Izetta Quivers, EMT-Paramedic  [phone] 12/23/2023

## 2023-12-25 ENCOUNTER — Other Ambulatory Visit: Payer: Self-pay

## 2023-12-25 ENCOUNTER — Telehealth (HOSPITAL_COMMUNITY): Payer: Self-pay

## 2023-12-25 NOTE — Telephone Encounter (Signed)
 Called to confirm/remind patient of their appointment at the [facility] on 12/26/23 1:30.   Appointment:   [x] Confirmed  [] Left mess   [] No answer/No voice mail  [] VM Full/unable to leave message  [] Phone not in service  Patient reminded to bring all medications and/or complete list.  Confirmed patient has transportation. Gave directions, instructed to utilize valet parking.

## 2023-12-26 ENCOUNTER — Ambulatory Visit (HOSPITAL_COMMUNITY)
Admission: RE | Admit: 2023-12-26 | Discharge: 2023-12-26 | Disposition: A | Discharge status: Home / Self Care | Source: Ambulatory Visit | Attending: Physician Assistant | Admitting: Physician Assistant

## 2023-12-26 ENCOUNTER — Encounter (HOSPITAL_COMMUNITY): Payer: Self-pay

## 2023-12-26 ENCOUNTER — Ambulatory Visit: Payer: Self-pay

## 2023-12-26 VITALS — BP 110/80 | Wt 126.8 lb

## 2023-12-26 DIAGNOSIS — I429 Cardiomyopathy, unspecified: Secondary | ICD-10-CM | POA: Insufficient documentation

## 2023-12-26 DIAGNOSIS — R531 Weakness: Secondary | ICD-10-CM | POA: Insufficient documentation

## 2023-12-26 DIAGNOSIS — I5022 Chronic systolic (congestive) heart failure: Secondary | ICD-10-CM | POA: Diagnosis not present

## 2023-12-26 DIAGNOSIS — R55 Syncope and collapse: Secondary | ICD-10-CM | POA: Insufficient documentation

## 2023-12-26 DIAGNOSIS — Z7984 Long term (current) use of oral hypoglycemic drugs: Secondary | ICD-10-CM | POA: Insufficient documentation

## 2023-12-26 DIAGNOSIS — K649 Unspecified hemorrhoids: Secondary | ICD-10-CM | POA: Insufficient documentation

## 2023-12-26 DIAGNOSIS — E8770 Fluid overload, unspecified: Secondary | ICD-10-CM | POA: Insufficient documentation

## 2023-12-26 DIAGNOSIS — Z79899 Other long term (current) drug therapy: Secondary | ICD-10-CM | POA: Insufficient documentation

## 2023-12-26 DIAGNOSIS — I2510 Atherosclerotic heart disease of native coronary artery without angina pectoris: Secondary | ICD-10-CM | POA: Diagnosis not present

## 2023-12-26 DIAGNOSIS — R5383 Other fatigue: Secondary | ICD-10-CM | POA: Insufficient documentation

## 2023-12-26 DIAGNOSIS — I081 Rheumatic disorders of both mitral and tricuspid valves: Secondary | ICD-10-CM | POA: Diagnosis not present

## 2023-12-26 DIAGNOSIS — F03A0 Unspecified dementia, mild, without behavioral disturbance, psychotic disturbance, mood disturbance, and anxiety: Secondary | ICD-10-CM | POA: Insufficient documentation

## 2023-12-26 DIAGNOSIS — Z951 Presence of aortocoronary bypass graft: Secondary | ICD-10-CM | POA: Insufficient documentation

## 2023-12-26 DIAGNOSIS — Z87891 Personal history of nicotine dependence: Secondary | ICD-10-CM | POA: Diagnosis not present

## 2023-12-26 DIAGNOSIS — Z7901 Long term (current) use of anticoagulants: Secondary | ICD-10-CM | POA: Diagnosis not present

## 2023-12-26 DIAGNOSIS — I472 Ventricular tachycardia, unspecified: Secondary | ICD-10-CM | POA: Diagnosis not present

## 2023-12-26 DIAGNOSIS — R7401 Elevation of levels of liver transaminase levels: Secondary | ICD-10-CM | POA: Diagnosis not present

## 2023-12-26 DIAGNOSIS — I4821 Permanent atrial fibrillation: Secondary | ICD-10-CM | POA: Diagnosis not present

## 2023-12-26 DIAGNOSIS — Z7189 Other specified counseling: Secondary | ICD-10-CM

## 2023-12-26 DIAGNOSIS — I255 Ischemic cardiomyopathy: Secondary | ICD-10-CM | POA: Diagnosis not present

## 2023-12-26 DIAGNOSIS — I517 Cardiomegaly: Secondary | ICD-10-CM | POA: Insufficient documentation

## 2023-12-26 DIAGNOSIS — I5082 Biventricular heart failure: Secondary | ICD-10-CM | POA: Diagnosis not present

## 2023-12-26 NOTE — Patient Instructions (Addendum)
 Referrals:  YOU HAVE BEEN REFERRED TO HOSPICE THEY WILL REACH OUT TO YOU OR CALL TO ARRANGE THIS. PLEASE CALL US  WITH ANY CONCERNS   FOLLOW UP AS NEEDED   At the [facility], you and your health needs are our priority. We have a designated team specialized in the treatment of Heart Failure. This Care Team includes your primary Heart Failure Specialized Cardiologist (physician), Advanced Practice Providers (APPs- Physician Assistants and Nurse Practitioners), and Pharmacist who all work together to provide you with the care you need, when you need it.   You may see any of the following providers on your designated Care Team at your next follow up:  Dr. Toribio Fuel Dr. Ezra Shuck Dr. Odis Brownie Greig Mosses, NP Caffie Shed, [place] [facility] [place], [place] Beckey Coe, NP Jordan Lee, NP Tinnie Redman, PharmD   Please be sure to bring in all your medications bottles to every appointment.   Need to Contact Us :  If you have any questions or concerns before your next appointment please send us  a message through [place] or call our office at [phone].    TO LEAVE A MESSAGE FOR THE NURSE SELECT OPTION 2, PLEASE LEAVE A MESSAGE INCLUDING: YOUR NAME DATE OF BIRTH CALL BACK NUMBER REASON FOR CALL**this is important as we prioritize the call backs  YOU WILL RECEIVE A CALL BACK THE SAME DAY AS LONG AS YOU CALL BEFORE 4:00 PM

## 2023-12-26 NOTE — Progress Notes (Signed)
 ADVANCED HF CLINIC NOTE  PCP: [name], PA-C Cardiology: Dr. Delford HF Cardiology: Dr. Rolan  HPI: 83 y.o. with history of CAD s/p CABG, ischemic cardiomyopathy, and permanent atrial fibrillation.  Patient had CABG x 5 in 8/18.  Post-op echo: EF 35%. Since that time, LV EF has ranged from 25-35%. Echo 1/24: EF 25-30%, moderate LV dilation, severely decreased RV systolic function, severe LAE, moderate MR. He had post-op atrial fibrillation. He failed amiodarone  and cardioversion, and it was decided not to pursue AF ablation.  He is in permanent atrial fibrillation.  In 12/23, he had a presyncopal episode.  Zio monitor was placed showing AF rate ranging 29 - 100 bpm, 46 pauses with the longest 4 seconds (at night). Also 28 NSVT runs, longest 18 beats.   Seen for initial HF visit in 2/24. Volume stable. Started on Farxiga .   He was admitted in 2/25 with bradycardia, suspected atrial fibrillation with complete heart block.  Biotronik PPM with left bundle lead was placed (he had not wanted ICD in past and now with advanced age). Echo in 2/25 showed EF 45-50%, basal inferior aneurysm, RV low normal function, severe LAE, IVC normal, moderate MR (possibly infarct-related).   Admitted 8/25 with rectal bleeding. GI consulted and felt 2/2 hemorrhoids. INR supratherapeutic on admit at 9.0. Coumadin  held. Labs showed significant transaminitis with AST and ALT greater than 1000. INR reversed w/ Vit K. Also w/ AKI, SCr 2.68 on admit (baseline ~1.6). Echo showed marked drop in LVEF since previous study 6 months ago, EF now 20-25% w/ global HK, mod concentric LVH. 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 AHF consulted, diuresed with IV lasix  and GDMT titrated. Drop in EF felt to be pacemaker-medicated cardiomyopathy with wide QRS (186 msec) despite left bundle pacing. Seen by EP, not a candidate for transition to BiV pacemaker given  overall picture per Dr. Cindie. Started on Eliquis  2.5 bid for [facility]. He was discharged home, weight 156 lbs.   Recently struggled with volume overload and received several doses of furoscix .   Here today for CHF follow-up. He is followed by Izetta with paramedicine. He is accompanied by his wife who assists with the history. He has done poorly the last couple of weeks. Not eating much, continues to lose weight, having a lot of low back pain. Significant lower extremity edema noted. Lies in bed most of the day and his wife puts a wedge under his feet. Can't get compression stockings on.  Has a hard time standing due to weakness and fatigue. Took his wife 3 hrs to get him ready for today's appointment. Mental status comes and goes, some moments he is more confused than others. His wife is concerned that he is suffering and is interested in pursuing hospice. She is worried about being able to care for him at home during evening hours as his debility continues to progress.   PMH:  1. CAD: S/p CABG 8/18 with LIMA-LAD, SVG-D2, SVG-OM1, seq SVG-OM2 and OM3.  2. Atrial fibrillation: Post-op CABG initially.  Failed amiodarone  and cardioversion. Decided against ablation in 2020. Now permanent.  3. Chronic systolic CHF: Ischemic cardiomyopathy.  - Echo (1/19): EF 35% - Echo (2020): EF 30-35% - Echo (8/22): EF 30-35%, moderate-severe MR - Echo (1/24): EF 25-30%, moderate LV dilation, severely decreased RV systolic function, severe LAE, moderate MR.  - Echo (2/25): EF 45-50%, basal inferior aneurysm, RV low normal  function, severe LAE, IVC normal, moderate MR (possibly infarct-related).  - Echo (8/25): EF 20-25% diffuse HK with basal septal aneurysm, RV mod reduced, severe MR and TR 4. 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  Moderate-severe on 8/22 echo but moderate on 1/24 echo.  - Echo 8/25: moderate MR 5. Bradycardia: Presyncopal episode in 12/23.  Zio monitor (1/24) with AF rate ranging 29 - 100 bpm,  46 pauses with the longest 4 seconds (at night).  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  6. NSVT: 28 NSVT runs on 1/24 Zio monitor, longest 18 beats.  7. Mild dementia 8. 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  Married.  Quit smoking remotely.  Rare ETOH.   Family History  Problem Relation Age of Onset   COPD Father    Emphysema Father    Healthy Brother    Diabetes Neg Hx    Cancer Neg Hx    Stomach cancer Neg Hx    Colon cancer Neg Hx    Allergic rhinitis Neg Hx    Angioedema Neg Hx    Asthma Neg Hx    Eczema Neg Hx    Urticaria Neg Hx    ROS: All systems reviewed and negative except as per HPI.   Current Outpatient Medications  Medication Sig Dispense Refill   apixaban  (ELIQUIS ) 2.5 MG TABS tablet Take 1 tablet (2.5 mg total) by mouth 2 (two) times daily. 60 tablet 2   cholecalciferol (VITAMIN D3) 25 MCG (1000 UNIT) tablet Take 5,000 Units by mouth daily.     dapagliflozin  propanediol (FARXIGA ) 10 MG TABS tablet Take 1 tablet (10 mg total) by mouth daily before breakfast. 90 tablet 3   desvenlafaxine  (PRISTIQ ) 50 MG 24 hr tablet Take 1 tablet (50 mg total) by mouth daily. 30 tablet 1   Furosemide  (FUROSCIX ) 80 MG/10ML CTKT Inject 80 mg into the skin daily as needed (ONLY USE AS ADVISED BY THE HEART FAILURE CLINIC). 10 each 0   metoprolol  succinate (TOPROL -XL) 25 MG 24 hr tablet Take 0.5 tablets (12.5 mg total) by mouth daily. Take 12.5mg  daily 45 tablet 3   spironolactone  (ALDACTONE ) 25 MG tablet Take 1 tablet (25 mg total) by mouth daily. 90 tablet 3   torsemide (DEMADEX) 20 MG tablet Take 2 tablets (40 mg total) by mouth 2 (two) times daily. 120 tablet 3   No current facility-administered medications for this encounter.   Wt Readings from Last 3 Encounters:  12/26/23 57.5 kg (126 lb 12.8 oz)  12/16/23 60.3 kg (133 lb)  12/12/23 62.1 kg (137 lb)   BP 110/80   Wt 57.5 kg (126 lb 12.8 oz)   BMI 17.20 kg/m    Physical Exam: General: Cachectic. Frail, ill appearing. Cor: JVP 7-8 cm. Irregular rhythm. No murmurs. Lungs: clear Extremities: 2+ edema b/l feet and ankles Neuro: Alert. Intermittently seems confused   ReDs reading: 29%, normal    Assessment/Plan: 1. Chronic Systolic Heart Failure, w/ Worsening Biventricular Failure: H/o ischemic CMP. Patient had CABG x 5 in 8/18. Post-op echo showed EF 35%.  Since that time, EF had mostly ranged from 25-30% but did improve to 45-50% on echo 2/25. s/p Biotronik PPM with LB lead 2/25 for CHB.  Echo 8/25: EF 20-25% diffuse HK with basal septal aneurysm, RV mod reduced, severe MR and TR. No chest pain, doubt ACS.  Concern for pacemaker-medicated cardiomyopathy with wide QRS (186 msec) despite left bundle pacing. Seen by EP, not  a candidate for transition to BiV pacemaker given overall picture per Dr. Cindie.   - NYHA IV. He appears end-stage. Patient and his wife express that he does not want any further appointments or lab draws. They want to focus on keeping him comfortable. This is very reasonable. - Continue torsmide 40 mg BID. Can use furoscix  as need at home for palliative diuresis. - Continue Farxiga  10 mg daily - Continue Toprol  XL 12.5 mg daily - Continue spiro 25 mg daily - He can stop these meds at anytime he chooses - He would be appropriate for hospice care if they are ready to make this transition. This was discussed with his HF Cardiologist Dr. Rolan, who saw the patient in clinic today and agrees. Encouraged family to discuss their goals further. Referral to Palliative Care submitted. He may eventually need to consider hospice facility as he continues to get weaker. His wife is concerned about being able to manage him alone at home when hospice RN is not available.  - He will follow-up in the clinic as needed but we will remain available for follow-up visits as needed - Currently has Paramedicine in the community. Appreciate their  assistance with his care.  I spent a total of 50 minutes today: 1) reviewing the patient's medical records including previous charts, labs and recent notes from other providers; 2) examining the patient and counseling them on their medical issues/explaining the plan of care; 3) discussing plan of care with daughter via phone  Follow up as needed  [facility], MANUELITA SAILOR, PA-C 12/26/2023

## 2023-12-30 ENCOUNTER — Other Ambulatory Visit (HOSPITAL_COMMUNITY): Payer: Self-pay

## 2023-12-30 ENCOUNTER — Telehealth (HOSPITAL_COMMUNITY): Payer: Self-pay

## 2023-12-30 ENCOUNTER — Ambulatory Visit (HOSPITAL_COMMUNITY)

## 2023-12-30 DIAGNOSIS — I5022 Chronic systolic (congestive) heart failure: Secondary | ICD-10-CM

## 2023-12-30 NOTE — Progress Notes (Signed)
 Paramedicine Encounter    Patient ID: Jason Day, male    DOB: November 11, 1940, 83 y.o.   MRN: [id number]   Complaints-lower back pain  Edema-rt foot/ankle   Compliance with meds-yes-daughter is adjusting meds??   Pt was referred to hospice earlier today. Pt was lying in bed when I arrived today. Appears in no distress.  Daughter wanted me to speak on phone so that was coordinated and she watched on FT during my assessment.  He does have some swelling to his rt foot/ankle lower leg. Wife does have them elevated some and also elevated his heels so they aren't pressing into the bed mattress.  Daughter reports that he has had about 500cc of urine output and it was dark in color. He has low fluid intake as well. No BM in several days. Daughter has spoken with a dr eric at [facility] about med administration and was wanting them to do a sub- q line for comfort meds. Daughter is adjusting his PO meds right now. She has advised him to not take the torsemide nor the metoprolol . They nor I have a time line about when hospice will be making a home visit.  I did assess him for beginning stages of sores on his back side-he has been c/o of that-he is truly skin and bones and on all the bony prominents of his bottom and lower back he is starting to get redness in those areas. Wife will get some barrier cream like A&D for that.  I will continue to make home visits until nurse/provider from hospice comes out and takes over care.  Other than this, he reports he is resting comfortably. Limited ambulation, he is pretty much bed bound at this time. He can still make it to the bathroom across the hall for the most part.  Wife is handing meds that are being given to him. I have explained to wife that once hospice comes out then will decide which meds stay for him to take and which ones doesn't.   I am waiting on the list to be sent to pts wife and she will send to me about what the daughter is telling her to give him and  I will make a note.      BP 98/62   Pulse 66   Resp 16    Patient Care Team: Tysinger, Alm RAMAN, PA-C as PCP - General (Family Medicine) Cindie Ole DASEN, MD as PCP - Electrophysiology (Cardiology) Delford Maude BROCKS, MD as PCP - Cardiology (Cardiology) Delford Maude BROCKS, MD as Consulting Physician (Cardiology) Melita Drivers, MD as Consulting Physician (Orthopedic Surgery) Watt Norleen, MD as Attending Physician (Urology) Rolan Ezra RAMAN, MD as Consulting Physician (Cardiology) Stacia Glendia BRAVO, MD as Consulting Physician (Gastroenterology) Lorin Norris, MD as Consulting Physician (Allergy)  Patient Active Problem List   Diagnosis Date Noted   Risk for falls 10/28/2023   DNR (do not resuscitate) 10/28/2023   History of liver disease 10/28/2023   Transaminitis 10/17/2023   Rectal bleeding 10/14/2023   Acute renal failure superimposed on stage 3a chronic kidney disease (HCC) 10/14/2023   Appetite impaired 09/26/2023   Lactose intolerance 09/26/2023   Bowel incontinence 03/30/2023   Supratherapeutic INR 03/29/2023   Chronic heart failure with mildly reduced ejection fraction (HFmrEF, 41-49%) (HCC) 03/29/2023   Acute on chronic systolic (congestive) heart failure (HCC) 03/29/2023   CKD stage 3a, GFR 45-59 ml/min (HCC) 03/29/2023   Skin lesion 11/06/2022   Marital conflict 11/17/2021   Memory impairment  12/30/2020   Coagulation defect 12/30/2020   Incontinence of feces 12/30/2020   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   Chronic diarrhea 12/29/2020   Weight loss 12/29/2020   Depression, major, single episode, mild 12/29/2020   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   Mitral regurgitation 12/25/2018   S/P reverse total shoulder arthroplasty, 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   Change in behavior 08/28/2017   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   Elevated PSA 04/23/2017   Serum total bilirubin elevated 04/22/2017   Hemorrhoids 04/22/2017   Permanent atrial fibrillation (HCC)    Chronic combined  systolic and diastolic heart failure (HCC)    Arteriosclerosis of coronary artery bypass graft    Hyperlipidemia    Elevated troponin 10/16/2016   Coronary artery disease     Current Outpatient Medications:    apixaban  (ELIQUIS ) 2.5 MG TABS tablet, Take 1 tablet (2.5 mg total) by mouth 2 (two) times daily., Disp: 60 tablet, Rfl: 2   cholecalciferol (VITAMIN D3) 25 MCG (1000 UNIT) tablet, Take 5,000 Units by mouth daily., Disp: , Rfl:    dapagliflozin  propanediol (FARXIGA ) 10 MG TABS tablet, Take 1 tablet (10 mg total) by mouth daily before breakfast., Disp: 90 tablet, Rfl: 3   desvenlafaxine  (PRISTIQ ) 50 MG 24 hr tablet, Take 1 tablet (50 mg total) by mouth daily., Disp: 30 tablet, Rfl: 1   Furosemide  (FUROSCIX ) 80 MG/10ML CTKT, Inject 80 mg into the skin daily as needed (ONLY USE AS ADVISED BY THE HEART FAILURE CLINIC)., Disp: 10 each, Rfl: 0   metoprolol  succinate (TOPROL -XL) 25 MG 24 hr tablet, Take 0.5 tablets (12.5 mg total) by mouth daily. 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  Tobacco Use   Smoking status: Former    Current packs/day: 0.00    Types: Cigarettes    Quit date: 1970    Years since quitting: 55.8    Passive exposure: Never   Smokeless tobacco: Never   Tobacco comments:    Quit in 1970  Vaping Use   Vaping status: Never Used  Substance and Sexual Activity   Alcohol  use: No   Drug use: No   Sexual activity: Yes  Other Topics Concern    Not on file  Social History Narrative   Married    Originally from [place]  moved to [place]  age 67 moved to [facility] 2018   1 son Javoni Lucken in [place]   1dtr [place] in [place]       Has living will   Wife is health care POA--then son   Would accept resuscitation   No tube feeds if cognitively unaware   Social Drivers of Health   Financial Resource Strain: Low Risk  (12/04/2022)   Overall Financial Resource Strain (CARDIA)    Difficulty of Paying Living Expenses: Not hard at all  Food Insecurity: No Food Insecurity (10/15/2023)   Hunger Vital Sign    Worried About Running Out  of Food in the Last Year: Never true    Ran Out of Food in the Last Year: Never true  Transportation Needs: No Transportation Needs (10/15/2023)   PRAPARE - Administrator, Civil Service (Medical): No    Lack of Transportation (Non-Medical): No  Physical Activity: Insufficiently Active (12/04/2022)   Exercise Vital Sign    Days of Exercise per Week: 3 days    Minutes of Exercise per Session: 30 min  Stress: No Stress Concern Present (12/04/2022)   [employer] of Occupational Health - Occupational Stress Questionnaire    Feeling of Stress : Not at all  Social Connections: Moderately Isolated (10/15/2023)   Social Connection and Isolation Panel    Frequency of Communication with Friends and Family: Never    Frequency of Social Gatherings with Friends and Family: Twice a week    Attends Religious Services: More than 4 times per year    Active Member of [employer] or Organizations: No    Attends Banker Meetings: Never    Marital Status: Married  Catering Manager Violence: Not At Risk (10/15/2023)   Humiliation, Afraid, Rape, and Kick questionnaire    Fear of Current or Ex-Partner: No    Emotionally Abused: No    Physically Abused: No    Sexually Abused: No    Physical Exam      Future Appointments  Date Time Provider Department Center  01/28/2024 11:20 AM  Lesia Ozell Barter, PA-C [facility]  02/13/2024  7:00 AM [facility] DEVICE REMOTES [facility]  03/05/2024  9:30 AM [facility] PA/NP [facility] None  05/13/2024  8:15 AM [facility] DEVICE REMOTES [facility]  08/12/2024  8:15 AM [facility] DEVICE REMOTES [facility]  11/11/2024  8:15 AM [facility] DEVICE REMOTES [facility]       Izetta Quivers, Paramedic [phone] [facility] Paramedic  12/30/23

## 2023-12-30 NOTE — Telephone Encounter (Signed)
 I reached out to pts wife to sch home visit and we discussed the referral that was placed for palliative form clinic last week. Wife was adamant that it was for hospice not palliative.  The note shows referral to palliative but bottom of note says referral to hospice.  She had misunderstanding on the differences between the 2 and I think there was conversation  from the daughter as well-she lives in [place] and she wasn't ready for him to go to hospice.  Wife reports that they are ready for hospice and the daughter has come around to this and would like me to call her when I go see him this afternoon.  I called clinic and no answer on triage, I sent jenna, LCSW, to call me ref this and she was able to get with nurse to pass this along and begin the referral process.   Izetta Quivers, EMT-Paramedic  [phone] 12/30/2023

## 2024-01-02 ENCOUNTER — Other Ambulatory Visit: Payer: Self-pay

## 2024-01-02 ENCOUNTER — Other Ambulatory Visit (HOSPITAL_COMMUNITY): Payer: Self-pay

## 2024-01-02 MED ORDER — HALOPERIDOL 5 MG PO TABS
5.0000 mg | ORAL_TABLET | ORAL | 0 refills | Status: DC | PRN
  Filled 2024-01-02: qty 30, 5d supply, fill #0

## 2024-01-02 MED ORDER — LORAZEPAM 0.5 MG PO TABS
0.5000 mg | ORAL_TABLET | ORAL | 0 refills | Status: DC | PRN
  Filled 2024-01-02: qty 60, 10d supply, fill #0

## 2024-01-02 MED ORDER — GLYCOPYRROLATE 1 MG PO TABS
1.0000 mg | ORAL_TABLET | Freq: Four times a day (QID) | ORAL | 0 refills | Status: DC | PRN
  Filled 2024-01-02 – 2024-01-03 (×3): qty 120, 30d supply, fill #0

## 2024-01-02 MED ORDER — LIDOCAINE 4 % EX PTCH
1.0000 | MEDICATED_PATCH | Freq: Every morning | CUTANEOUS | 0 refills | Status: AC
  Filled 2024-01-02: qty 5, 5d supply, fill #0

## 2024-01-03 ENCOUNTER — Other Ambulatory Visit (HOSPITAL_COMMUNITY): Payer: Self-pay

## 2024-01-03 ENCOUNTER — Telehealth: Payer: Self-pay | Admitting: Cardiology

## 2024-01-03 ENCOUNTER — Other Ambulatory Visit: Payer: Self-pay

## 2024-01-03 NOTE — Telephone Encounter (Signed)
 Spoke to patients daughter to advise patient has pacemaker and we do not turn that portion off. Voiced understanding and was appreciative of call.

## 2024-01-03 NOTE — Telephone Encounter (Signed)
 Pt's daughter calling to state that pt is now under Hospice care. She would like to know if pt's device needs to be deactivated and whether it has an ICD. Please advise

## 2024-01-06 ENCOUNTER — Encounter (HOSPITAL_COMMUNITY): Payer: Self-pay | Admitting: Adult Health

## 2024-01-06 ENCOUNTER — Telehealth (HOSPITAL_COMMUNITY): Payer: Self-pay

## 2024-01-06 ENCOUNTER — Telehealth: Payer: Self-pay | Admitting: Internal Medicine

## 2024-01-06 NOTE — Telephone Encounter (Unsigned)
 Copied from CRM #[id number]. Topic: Clinical - Medical Advice >> Jan 06, 2024  9:12 AM Tiffany B wrote: Reason for CRM: Caller wanted patient PCP to be aware patient is in hospice at home and caller states she was informed patient can pass any day now. Caller wanted patient PCP to be aware.

## 2024-01-06 NOTE — Telephone Encounter (Signed)
 Patient is now discharged from [facility].  Patient has/has not met the following goals:    Discharge Comments:  Pt is now in the care of hospice and currently at [facility] per the wife.   Izetta Quivers, EMT-Paramedic  [phone] 01/06/2024

## 2024-01-06 NOTE — Progress Notes (Signed)
  HF [facility] Discharge  Discussed with HF Paramedic and HF Team.    At [facility] and is end stage.   Jerimiah Wolman NP-C  6:05 PM

## 2024-01-08 ENCOUNTER — Encounter (HOSPITAL_COMMUNITY): Payer: Self-pay

## 2024-01-08 ENCOUNTER — Other Ambulatory Visit: Payer: Self-pay

## 2024-01-14 ENCOUNTER — Encounter: Payer: Self-pay | Admitting: *Deleted

## 2024-01-14 NOTE — Progress Notes (Signed)
 Received fax from [facility], pt passed away on 02/03/2024 at 6:18 pm, pt marked deceased in chart, Dr Rolan aware

## 2024-01-15 ENCOUNTER — Telehealth: Payer: Self-pay | Admitting: Family Medicine

## 2024-01-15 NOTE — Telephone Encounter (Signed)
Sympathy card sent 

## 2024-01-20 DEATH — deceased

## 2024-01-28 ENCOUNTER — Ambulatory Visit: Admitting: Student

## 2024-02-13 ENCOUNTER — Encounter

## 2024-03-05 ENCOUNTER — Encounter (HOSPITAL_COMMUNITY)

## 2024-05-13 ENCOUNTER — Encounter

## 2024-08-12 ENCOUNTER — Encounter

## 2024-11-11 ENCOUNTER — Encounter
